# Patient Record
Sex: Male | Born: 1940
Health system: Southern US, Community
[De-identification: ages and names within clinical notes are randomized; demographics above are authoritative.]

## PROBLEM LIST (undated history)

## (undated) DIAGNOSIS — R51 Headache: Secondary | ICD-10-CM

## (undated) DIAGNOSIS — I499 Cardiac arrhythmia, unspecified: Secondary | ICD-10-CM

## (undated) DIAGNOSIS — E7800 Pure hypercholesterolemia, unspecified: Secondary | ICD-10-CM

## (undated) DIAGNOSIS — J302 Other seasonal allergic rhinitis: Secondary | ICD-10-CM

## (undated) DIAGNOSIS — Z95810 Presence of automatic (implantable) cardiac defibrillator: Secondary | ICD-10-CM

## (undated) DIAGNOSIS — M1990 Unspecified osteoarthritis, unspecified site: Secondary | ICD-10-CM

## (undated) DIAGNOSIS — IMO0001 Reserved for inherently not codable concepts without codable children: Secondary | ICD-10-CM

## (undated) DIAGNOSIS — I509 Heart failure, unspecified: Secondary | ICD-10-CM

## (undated) DIAGNOSIS — I219 Acute myocardial infarction, unspecified: Secondary | ICD-10-CM

## (undated) DIAGNOSIS — J342 Deviated nasal septum: Secondary | ICD-10-CM

## (undated) DIAGNOSIS — G4730 Sleep apnea, unspecified: Secondary | ICD-10-CM

## (undated) DIAGNOSIS — T8859XA Other complications of anesthesia, initial encounter: Secondary | ICD-10-CM

## (undated) DIAGNOSIS — R42 Dizziness and giddiness: Secondary | ICD-10-CM

## (undated) DIAGNOSIS — E039 Hypothyroidism, unspecified: Secondary | ICD-10-CM

## (undated) DIAGNOSIS — I2510 Atherosclerotic heart disease of native coronary artery without angina pectoris: Secondary | ICD-10-CM

## (undated) DIAGNOSIS — M359 Systemic involvement of connective tissue, unspecified: Secondary | ICD-10-CM

## (undated) DIAGNOSIS — R29898 Other symptoms and signs involving the musculoskeletal system: Secondary | ICD-10-CM

## (undated) DIAGNOSIS — Z950 Presence of cardiac pacemaker: Secondary | ICD-10-CM

## (undated) DIAGNOSIS — H9190 Unspecified hearing loss, unspecified ear: Secondary | ICD-10-CM

## (undated) DIAGNOSIS — J329 Chronic sinusitis, unspecified: Secondary | ICD-10-CM

## (undated) DIAGNOSIS — R2689 Other abnormalities of gait and mobility: Secondary | ICD-10-CM

## (undated) DIAGNOSIS — R519 Headache, unspecified: Secondary | ICD-10-CM

## (undated) DIAGNOSIS — T4145XA Adverse effect of unspecified anesthetic, initial encounter: Secondary | ICD-10-CM

## (undated) DIAGNOSIS — I739 Peripheral vascular disease, unspecified: Secondary | ICD-10-CM

## (undated) HISTORY — DX: Other symptoms and signs involving the musculoskeletal system: R29.898

## (undated) HISTORY — PX: CATARACT EXTRACTION: SUR2

## (undated) HISTORY — PX: INSERT / REPLACE / REMOVE PACEMAKER: SUR710

## (undated) HISTORY — DX: Unspecified osteoarthritis, unspecified site: M19.90

## (undated) HISTORY — DX: Headache, unspecified: R51.9

## (undated) HISTORY — PX: CARDIAC SURGERY: SHX584

## (undated) HISTORY — PX: OTHER SURGICAL HISTORY: SHX169

## (undated) HISTORY — DX: Other abnormalities of gait and mobility: R26.89

## (undated) HISTORY — PX: CORONARY ANGIOPLASTY: SHX604

## (undated) HISTORY — PX: SEPTOPLASTY: SUR1290

## (undated) HISTORY — DX: Pure hypercholesterolemia, unspecified: E78.00

## (undated) HISTORY — PX: CARDIAC ELECTROPHYSIOLOGY STUDY AND ABLATION: SHX1294

## (undated) HISTORY — DX: Headache: R51

---

## 1990-01-06 HISTORY — PX: CORONARY ARTERY BYPASS GRAFT: SHX141

## 2001-01-12 ENCOUNTER — Encounter: Payer: Self-pay | Admitting: Cardiology

## 2001-01-12 ENCOUNTER — Ambulatory Visit (HOSPITAL_COMMUNITY): Admission: RE | Admit: 2001-01-12 | Discharge: 2001-01-12 | Payer: Self-pay | Admitting: Cardiology

## 2011-04-04 DIAGNOSIS — H538 Other visual disturbances: Secondary | ICD-10-CM | POA: Diagnosis not present

## 2011-04-04 DIAGNOSIS — H53149 Visual discomfort, unspecified: Secondary | ICD-10-CM | POA: Diagnosis not present

## 2011-04-04 DIAGNOSIS — H524 Presbyopia: Secondary | ICD-10-CM | POA: Diagnosis not present

## 2011-04-04 DIAGNOSIS — H2510 Age-related nuclear cataract, unspecified eye: Secondary | ICD-10-CM | POA: Diagnosis not present

## 2011-04-29 DIAGNOSIS — H2510 Age-related nuclear cataract, unspecified eye: Secondary | ICD-10-CM | POA: Diagnosis not present

## 2011-04-29 DIAGNOSIS — H5700 Unspecified anomaly of pupillary function: Secondary | ICD-10-CM | POA: Diagnosis not present

## 2011-04-29 DIAGNOSIS — H269 Unspecified cataract: Secondary | ICD-10-CM | POA: Diagnosis not present

## 2011-05-26 DIAGNOSIS — H2510 Age-related nuclear cataract, unspecified eye: Secondary | ICD-10-CM | POA: Diagnosis not present

## 2011-05-26 DIAGNOSIS — Z961 Presence of intraocular lens: Secondary | ICD-10-CM | POA: Diagnosis not present

## 2011-06-10 DIAGNOSIS — H5700 Unspecified anomaly of pupillary function: Secondary | ICD-10-CM | POA: Diagnosis not present

## 2011-06-10 DIAGNOSIS — IMO0002 Reserved for concepts with insufficient information to code with codable children: Secondary | ICD-10-CM | POA: Diagnosis not present

## 2011-06-10 DIAGNOSIS — H269 Unspecified cataract: Secondary | ICD-10-CM | POA: Diagnosis not present

## 2011-07-07 DIAGNOSIS — R609 Edema, unspecified: Secondary | ICD-10-CM | POA: Diagnosis not present

## 2011-07-07 DIAGNOSIS — J309 Allergic rhinitis, unspecified: Secondary | ICD-10-CM | POA: Diagnosis not present

## 2011-07-07 DIAGNOSIS — I10 Essential (primary) hypertension: Secondary | ICD-10-CM | POA: Diagnosis not present

## 2011-07-07 DIAGNOSIS — Z125 Encounter for screening for malignant neoplasm of prostate: Secondary | ICD-10-CM | POA: Diagnosis not present

## 2011-07-07 DIAGNOSIS — I2510 Atherosclerotic heart disease of native coronary artery without angina pectoris: Secondary | ICD-10-CM | POA: Diagnosis not present

## 2011-09-09 DIAGNOSIS — Z961 Presence of intraocular lens: Secondary | ICD-10-CM | POA: Diagnosis not present

## 2011-09-09 DIAGNOSIS — H40019 Open angle with borderline findings, low risk, unspecified eye: Secondary | ICD-10-CM | POA: Diagnosis not present

## 2011-09-09 DIAGNOSIS — H524 Presbyopia: Secondary | ICD-10-CM | POA: Diagnosis not present

## 2012-01-14 DIAGNOSIS — Z23 Encounter for immunization: Secondary | ICD-10-CM | POA: Diagnosis not present

## 2012-01-14 DIAGNOSIS — I10 Essential (primary) hypertension: Secondary | ICD-10-CM | POA: Diagnosis not present

## 2012-01-14 DIAGNOSIS — I2510 Atherosclerotic heart disease of native coronary artery without angina pectoris: Secondary | ICD-10-CM | POA: Diagnosis not present

## 2012-01-14 DIAGNOSIS — R609 Edema, unspecified: Secondary | ICD-10-CM | POA: Diagnosis not present

## 2012-07-12 DIAGNOSIS — I10 Essential (primary) hypertension: Secondary | ICD-10-CM | POA: Insufficient documentation

## 2012-07-12 DIAGNOSIS — I214 Non-ST elevation (NSTEMI) myocardial infarction: Secondary | ICD-10-CM | POA: Insufficient documentation

## 2012-07-12 DIAGNOSIS — E785 Hyperlipidemia, unspecified: Secondary | ICD-10-CM | POA: Insufficient documentation

## 2012-07-12 DIAGNOSIS — M069 Rheumatoid arthritis, unspecified: Secondary | ICD-10-CM | POA: Insufficient documentation

## 2012-07-26 DIAGNOSIS — Z95810 Presence of automatic (implantable) cardiac defibrillator: Secondary | ICD-10-CM | POA: Insufficient documentation

## 2012-08-02 DIAGNOSIS — I5020 Unspecified systolic (congestive) heart failure: Secondary | ICD-10-CM | POA: Insufficient documentation

## 2012-08-16 DIAGNOSIS — R0989 Other specified symptoms and signs involving the circulatory and respiratory systems: Secondary | ICD-10-CM | POA: Diagnosis not present

## 2012-09-14 DIAGNOSIS — I472 Ventricular tachycardia: Secondary | ICD-10-CM | POA: Insufficient documentation

## 2012-09-14 DIAGNOSIS — I4729 Other ventricular tachycardia: Secondary | ICD-10-CM | POA: Insufficient documentation

## 2013-01-12 DIAGNOSIS — L989 Disorder of the skin and subcutaneous tissue, unspecified: Secondary | ICD-10-CM | POA: Diagnosis not present

## 2013-01-12 DIAGNOSIS — R799 Abnormal finding of blood chemistry, unspecified: Secondary | ICD-10-CM | POA: Diagnosis not present

## 2013-01-12 DIAGNOSIS — M069 Rheumatoid arthritis, unspecified: Secondary | ICD-10-CM | POA: Diagnosis not present

## 2013-01-19 DIAGNOSIS — I2510 Atherosclerotic heart disease of native coronary artery without angina pectoris: Secondary | ICD-10-CM | POA: Diagnosis not present

## 2013-01-19 DIAGNOSIS — I10 Essential (primary) hypertension: Secondary | ICD-10-CM | POA: Diagnosis not present

## 2013-01-19 DIAGNOSIS — I5020 Unspecified systolic (congestive) heart failure: Secondary | ICD-10-CM | POA: Diagnosis not present

## 2013-02-03 DIAGNOSIS — I509 Heart failure, unspecified: Secondary | ICD-10-CM | POA: Diagnosis not present

## 2013-02-03 DIAGNOSIS — R5383 Other fatigue: Secondary | ICD-10-CM | POA: Diagnosis not present

## 2013-02-03 DIAGNOSIS — J019 Acute sinusitis, unspecified: Secondary | ICD-10-CM | POA: Diagnosis not present

## 2013-02-03 DIAGNOSIS — R5381 Other malaise: Secondary | ICD-10-CM | POA: Diagnosis not present

## 2013-02-09 DIAGNOSIS — Z95810 Presence of automatic (implantable) cardiac defibrillator: Secondary | ICD-10-CM | POA: Diagnosis not present

## 2013-02-17 DIAGNOSIS — I519 Heart disease, unspecified: Secondary | ICD-10-CM | POA: Diagnosis not present

## 2013-02-17 DIAGNOSIS — I080 Rheumatic disorders of both mitral and aortic valves: Secondary | ICD-10-CM | POA: Diagnosis not present

## 2013-02-17 DIAGNOSIS — I4729 Other ventricular tachycardia: Secondary | ICD-10-CM | POA: Diagnosis not present

## 2013-02-17 DIAGNOSIS — I5020 Unspecified systolic (congestive) heart failure: Secondary | ICD-10-CM | POA: Diagnosis not present

## 2013-02-17 DIAGNOSIS — I517 Cardiomegaly: Secondary | ICD-10-CM | POA: Diagnosis not present

## 2013-02-17 DIAGNOSIS — I10 Essential (primary) hypertension: Secondary | ICD-10-CM | POA: Diagnosis not present

## 2013-02-17 DIAGNOSIS — I472 Ventricular tachycardia: Secondary | ICD-10-CM | POA: Diagnosis not present

## 2013-02-17 DIAGNOSIS — I2510 Atherosclerotic heart disease of native coronary artery without angina pectoris: Secondary | ICD-10-CM | POA: Diagnosis not present

## 2013-02-17 DIAGNOSIS — I079 Rheumatic tricuspid valve disease, unspecified: Secondary | ICD-10-CM | POA: Diagnosis not present

## 2013-02-17 DIAGNOSIS — I509 Heart failure, unspecified: Secondary | ICD-10-CM | POA: Diagnosis not present

## 2013-02-17 DIAGNOSIS — I214 Non-ST elevation (NSTEMI) myocardial infarction: Secondary | ICD-10-CM | POA: Diagnosis not present

## 2013-03-21 DIAGNOSIS — C44319 Basal cell carcinoma of skin of other parts of face: Secondary | ICD-10-CM | POA: Diagnosis not present

## 2013-03-24 DIAGNOSIS — I509 Heart failure, unspecified: Secondary | ICD-10-CM | POA: Diagnosis not present

## 2013-03-24 DIAGNOSIS — Z79899 Other long term (current) drug therapy: Secondary | ICD-10-CM | POA: Diagnosis not present

## 2013-03-24 DIAGNOSIS — E7800 Pure hypercholesterolemia, unspecified: Secondary | ICD-10-CM | POA: Diagnosis not present

## 2013-03-24 DIAGNOSIS — Z125 Encounter for screening for malignant neoplasm of prostate: Secondary | ICD-10-CM | POA: Diagnosis not present

## 2013-03-24 DIAGNOSIS — I2510 Atherosclerotic heart disease of native coronary artery without angina pectoris: Secondary | ICD-10-CM | POA: Diagnosis not present

## 2013-03-24 DIAGNOSIS — I10 Essential (primary) hypertension: Secondary | ICD-10-CM | POA: Diagnosis not present

## 2013-04-11 DIAGNOSIS — Z95810 Presence of automatic (implantable) cardiac defibrillator: Secondary | ICD-10-CM | POA: Diagnosis not present

## 2013-04-11 DIAGNOSIS — I259 Chronic ischemic heart disease, unspecified: Secondary | ICD-10-CM | POA: Diagnosis not present

## 2013-04-11 DIAGNOSIS — IMO0001 Reserved for inherently not codable concepts without codable children: Secondary | ICD-10-CM | POA: Diagnosis not present

## 2013-04-13 DIAGNOSIS — H35039 Hypertensive retinopathy, unspecified eye: Secondary | ICD-10-CM | POA: Diagnosis not present

## 2013-04-13 DIAGNOSIS — H5210 Myopia, unspecified eye: Secondary | ICD-10-CM | POA: Diagnosis not present

## 2013-04-13 DIAGNOSIS — H04129 Dry eye syndrome of unspecified lacrimal gland: Secondary | ICD-10-CM | POA: Diagnosis not present

## 2013-04-13 DIAGNOSIS — H40019 Open angle with borderline findings, low risk, unspecified eye: Secondary | ICD-10-CM | POA: Diagnosis not present

## 2013-04-13 DIAGNOSIS — H01009 Unspecified blepharitis unspecified eye, unspecified eyelid: Secondary | ICD-10-CM | POA: Diagnosis not present

## 2013-04-13 DIAGNOSIS — Z961 Presence of intraocular lens: Secondary | ICD-10-CM | POA: Diagnosis not present

## 2013-04-18 DIAGNOSIS — C44319 Basal cell carcinoma of skin of other parts of face: Secondary | ICD-10-CM | POA: Diagnosis not present

## 2013-04-18 DIAGNOSIS — B351 Tinea unguium: Secondary | ICD-10-CM | POA: Diagnosis not present

## 2013-04-18 DIAGNOSIS — B352 Tinea manuum: Secondary | ICD-10-CM | POA: Diagnosis not present

## 2013-04-19 DIAGNOSIS — M069 Rheumatoid arthritis, unspecified: Secondary | ICD-10-CM | POA: Diagnosis not present

## 2013-04-19 DIAGNOSIS — R799 Abnormal finding of blood chemistry, unspecified: Secondary | ICD-10-CM | POA: Diagnosis not present

## 2013-04-19 DIAGNOSIS — H04129 Dry eye syndrome of unspecified lacrimal gland: Secondary | ICD-10-CM | POA: Diagnosis not present

## 2013-04-19 DIAGNOSIS — L989 Disorder of the skin and subcutaneous tissue, unspecified: Secondary | ICD-10-CM | POA: Diagnosis not present

## 2013-05-17 DIAGNOSIS — R141 Gas pain: Secondary | ICD-10-CM | POA: Diagnosis not present

## 2013-05-17 DIAGNOSIS — Z79899 Other long term (current) drug therapy: Secondary | ICD-10-CM | POA: Diagnosis not present

## 2013-05-17 DIAGNOSIS — R143 Flatulence: Secondary | ICD-10-CM | POA: Diagnosis not present

## 2013-05-17 DIAGNOSIS — I509 Heart failure, unspecified: Secondary | ICD-10-CM | POA: Diagnosis not present

## 2013-05-26 DIAGNOSIS — I5020 Unspecified systolic (congestive) heart failure: Secondary | ICD-10-CM | POA: Diagnosis not present

## 2013-05-26 DIAGNOSIS — I10 Essential (primary) hypertension: Secondary | ICD-10-CM | POA: Diagnosis not present

## 2013-05-26 DIAGNOSIS — I214 Non-ST elevation (NSTEMI) myocardial infarction: Secondary | ICD-10-CM | POA: Diagnosis not present

## 2013-05-26 DIAGNOSIS — E785 Hyperlipidemia, unspecified: Secondary | ICD-10-CM | POA: Diagnosis not present

## 2013-06-03 DIAGNOSIS — Z79899 Other long term (current) drug therapy: Secondary | ICD-10-CM | POA: Diagnosis not present

## 2013-06-14 DIAGNOSIS — I10 Essential (primary) hypertension: Secondary | ICD-10-CM | POA: Diagnosis not present

## 2013-06-14 DIAGNOSIS — I214 Non-ST elevation (NSTEMI) myocardial infarction: Secondary | ICD-10-CM | POA: Diagnosis not present

## 2013-06-14 DIAGNOSIS — I472 Ventricular tachycardia: Secondary | ICD-10-CM | POA: Diagnosis not present

## 2013-06-14 DIAGNOSIS — R5381 Other malaise: Secondary | ICD-10-CM | POA: Diagnosis not present

## 2013-06-14 DIAGNOSIS — R5383 Other fatigue: Secondary | ICD-10-CM | POA: Diagnosis not present

## 2013-06-14 DIAGNOSIS — I5020 Unspecified systolic (congestive) heart failure: Secondary | ICD-10-CM | POA: Diagnosis not present

## 2013-06-14 DIAGNOSIS — I4729 Other ventricular tachycardia: Secondary | ICD-10-CM | POA: Diagnosis not present

## 2013-06-14 DIAGNOSIS — I2510 Atherosclerotic heart disease of native coronary artery without angina pectoris: Secondary | ICD-10-CM | POA: Diagnosis not present

## 2013-06-14 DIAGNOSIS — Z0189 Encounter for other specified special examinations: Secondary | ICD-10-CM | POA: Diagnosis not present

## 2013-06-21 DIAGNOSIS — H04129 Dry eye syndrome of unspecified lacrimal gland: Secondary | ICD-10-CM | POA: Diagnosis not present

## 2013-06-21 DIAGNOSIS — H01009 Unspecified blepharitis unspecified eye, unspecified eyelid: Secondary | ICD-10-CM | POA: Diagnosis not present

## 2013-06-21 DIAGNOSIS — H40019 Open angle with borderline findings, low risk, unspecified eye: Secondary | ICD-10-CM | POA: Diagnosis not present

## 2013-06-29 DIAGNOSIS — H04129 Dry eye syndrome of unspecified lacrimal gland: Secondary | ICD-10-CM | POA: Diagnosis not present

## 2013-06-29 DIAGNOSIS — M069 Rheumatoid arthritis, unspecified: Secondary | ICD-10-CM | POA: Diagnosis not present

## 2013-06-29 DIAGNOSIS — R799 Abnormal finding of blood chemistry, unspecified: Secondary | ICD-10-CM | POA: Diagnosis not present

## 2013-06-29 DIAGNOSIS — L989 Disorder of the skin and subcutaneous tissue, unspecified: Secondary | ICD-10-CM | POA: Diagnosis not present

## 2013-07-02 ENCOUNTER — Observation Stay: Payer: Self-pay | Admitting: Physician Assistant

## 2013-07-02 DIAGNOSIS — R079 Chest pain, unspecified: Secondary | ICD-10-CM | POA: Diagnosis not present

## 2013-07-02 DIAGNOSIS — Z79899 Other long term (current) drug therapy: Secondary | ICD-10-CM | POA: Diagnosis not present

## 2013-07-02 DIAGNOSIS — Z951 Presence of aortocoronary bypass graft: Secondary | ICD-10-CM | POA: Diagnosis not present

## 2013-07-02 DIAGNOSIS — M069 Rheumatoid arthritis, unspecified: Secondary | ICD-10-CM | POA: Diagnosis not present

## 2013-07-02 DIAGNOSIS — R0789 Other chest pain: Secondary | ICD-10-CM | POA: Diagnosis not present

## 2013-07-02 DIAGNOSIS — I4729 Other ventricular tachycardia: Secondary | ICD-10-CM | POA: Diagnosis not present

## 2013-07-02 DIAGNOSIS — I25810 Atherosclerosis of coronary artery bypass graft(s) without angina pectoris: Secondary | ICD-10-CM | POA: Diagnosis not present

## 2013-07-02 DIAGNOSIS — Z9861 Coronary angioplasty status: Secondary | ICD-10-CM | POA: Diagnosis not present

## 2013-07-02 DIAGNOSIS — R109 Unspecified abdominal pain: Secondary | ICD-10-CM | POA: Diagnosis not present

## 2013-07-02 DIAGNOSIS — Z7902 Long term (current) use of antithrombotics/antiplatelets: Secondary | ICD-10-CM | POA: Diagnosis not present

## 2013-07-02 DIAGNOSIS — I472 Ventricular tachycardia: Secondary | ICD-10-CM | POA: Diagnosis not present

## 2013-07-02 DIAGNOSIS — I2510 Atherosclerotic heart disease of native coronary artery without angina pectoris: Secondary | ICD-10-CM | POA: Diagnosis not present

## 2013-07-02 DIAGNOSIS — Z95810 Presence of automatic (implantable) cardiac defibrillator: Secondary | ICD-10-CM | POA: Diagnosis not present

## 2013-07-02 DIAGNOSIS — K219 Gastro-esophageal reflux disease without esophagitis: Secondary | ICD-10-CM | POA: Diagnosis not present

## 2013-07-02 DIAGNOSIS — Z8489 Family history of other specified conditions: Secondary | ICD-10-CM | POA: Diagnosis not present

## 2013-07-02 DIAGNOSIS — I4510 Unspecified right bundle-branch block: Secondary | ICD-10-CM | POA: Diagnosis not present

## 2013-07-02 LAB — BASIC METABOLIC PANEL
Anion Gap: 10 (ref 7–16)
BUN: 23 mg/dL — AB (ref 7–18)
CALCIUM: 9.1 mg/dL (ref 8.5–10.1)
CREATININE: 1.58 mg/dL — AB (ref 0.60–1.30)
Chloride: 100 mmol/L (ref 98–107)
Co2: 27 mmol/L (ref 21–32)
EGFR (African American): 50 — ABNORMAL LOW
GFR CALC NON AF AMER: 43 — AB
Glucose: 89 mg/dL (ref 65–99)
Osmolality: 277 (ref 275–301)
Potassium: 3.7 mmol/L (ref 3.5–5.1)
SODIUM: 137 mmol/L (ref 136–145)

## 2013-07-02 LAB — HEPATIC FUNCTION PANEL A (ARMC)
ALBUMIN: 3.8 g/dL (ref 3.4–5.0)
ALT: 33 U/L (ref 12–78)
Alkaline Phosphatase: 56 U/L
Bilirubin, Direct: <0.1 mg/dL (ref 0.00–0.20)
Bilirubin,Total: 0.3 mg/dL (ref 0.2–1.0)
SGOT(AST): 35 U/L (ref 15–37)
TOTAL PROTEIN: 7.5 g/dL (ref 6.4–8.2)

## 2013-07-02 LAB — CBC
HCT: 39.4 % — AB (ref 40.0–52.0)
HGB: 12.8 g/dL — ABNORMAL LOW (ref 13.0–18.0)
MCH: 31.7 pg (ref 26.0–34.0)
MCHC: 32.6 g/dL (ref 32.0–36.0)
MCV: 97 fL (ref 80–100)
Platelet: 222 10*3/uL (ref 150–440)
RBC: 4.05 10*6/uL — AB (ref 4.40–5.90)
RDW: 17.2 % — ABNORMAL HIGH (ref 11.5–14.5)
WBC: 9.3 10*3/uL (ref 3.8–10.6)

## 2013-07-02 LAB — LIPASE, BLOOD: LIPASE: 225 U/L (ref 73–393)

## 2013-07-02 LAB — TROPONIN I: Troponin-I: <0.02 ng/mL

## 2013-07-03 DIAGNOSIS — R079 Chest pain, unspecified: Secondary | ICD-10-CM | POA: Diagnosis not present

## 2013-07-03 DIAGNOSIS — I25810 Atherosclerosis of coronary artery bypass graft(s) without angina pectoris: Secondary | ICD-10-CM | POA: Diagnosis not present

## 2013-07-03 LAB — CBC WITH DIFFERENTIAL/PLATELET
Basophil #: 0.1 10*3/uL (ref 0.0–0.1)
Basophil %: 1.1 %
EOS PCT: 4.1 %
Eosinophil #: 0.3 10*3/uL (ref 0.0–0.7)
HCT: 36.2 % — AB (ref 40.0–52.0)
HGB: 12 g/dL — ABNORMAL LOW (ref 13.0–18.0)
LYMPHS PCT: 18.2 %
Lymphocyte #: 1.4 10*3/uL (ref 1.0–3.6)
MCH: 32.2 pg (ref 26.0–34.0)
MCHC: 33.2 g/dL (ref 32.0–36.0)
MCV: 97 fL (ref 80–100)
MONO ABS: 1.2 x10 3/mm — AB (ref 0.2–1.0)
Monocyte %: 15.9 %
NEUTROS ABS: 4.6 10*3/uL (ref 1.4–6.5)
NEUTROS PCT: 60.7 %
PLATELETS: 199 10*3/uL (ref 150–440)
RBC: 3.73 10*6/uL — AB (ref 4.40–5.90)
RDW: 17.2 % — ABNORMAL HIGH (ref 11.5–14.5)
WBC: 7.6 10*3/uL (ref 3.8–10.6)

## 2013-07-03 LAB — BASIC METABOLIC PANEL
Anion Gap: 7 (ref 7–16)
BUN: 19 mg/dL — ABNORMAL HIGH (ref 7–18)
CALCIUM: 9 mg/dL (ref 8.5–10.1)
Chloride: 103 mmol/L (ref 98–107)
Co2: 27 mmol/L (ref 21–32)
Creatinine: 1.54 mg/dL — ABNORMAL HIGH (ref 0.60–1.30)
EGFR (African American): 51 — ABNORMAL LOW
EGFR (Non-African Amer.): 44 — ABNORMAL LOW
Glucose: 90 mg/dL (ref 65–99)
Osmolality: 276 (ref 275–301)
POTASSIUM: 4.2 mmol/L (ref 3.5–5.1)
Sodium: 137 mmol/L (ref 136–145)

## 2013-07-03 LAB — CK-MB
CK-MB: 0.9 ng/mL (ref 0.5–3.6)
CK-MB: 1.1 ng/mL (ref 0.5–3.6)
CK-MB: 1.2 ng/mL (ref 0.5–3.6)

## 2013-07-03 LAB — TROPONIN I
Troponin-I: <0.02 ng/mL
Troponin-I: <0.02 ng/mL

## 2013-08-23 DIAGNOSIS — C44319 Basal cell carcinoma of skin of other parts of face: Secondary | ICD-10-CM | POA: Diagnosis not present

## 2013-08-23 DIAGNOSIS — B354 Tinea corporis: Secondary | ICD-10-CM | POA: Diagnosis not present

## 2013-08-23 DIAGNOSIS — B356 Tinea cruris: Secondary | ICD-10-CM | POA: Diagnosis not present

## 2013-08-23 DIAGNOSIS — L570 Actinic keratosis: Secondary | ICD-10-CM | POA: Diagnosis not present

## 2013-08-30 DIAGNOSIS — R799 Abnormal finding of blood chemistry, unspecified: Secondary | ICD-10-CM | POA: Diagnosis not present

## 2013-08-30 DIAGNOSIS — L989 Disorder of the skin and subcutaneous tissue, unspecified: Secondary | ICD-10-CM | POA: Diagnosis not present

## 2013-08-30 DIAGNOSIS — H04129 Dry eye syndrome of unspecified lacrimal gland: Secondary | ICD-10-CM | POA: Diagnosis not present

## 2013-08-30 DIAGNOSIS — M069 Rheumatoid arthritis, unspecified: Secondary | ICD-10-CM | POA: Diagnosis not present

## 2013-09-14 DIAGNOSIS — Z95810 Presence of automatic (implantable) cardiac defibrillator: Secondary | ICD-10-CM | POA: Diagnosis not present

## 2013-09-14 DIAGNOSIS — I472 Ventricular tachycardia: Secondary | ICD-10-CM | POA: Diagnosis not present

## 2013-09-14 DIAGNOSIS — I10 Essential (primary) hypertension: Secondary | ICD-10-CM | POA: Diagnosis not present

## 2013-09-14 DIAGNOSIS — I4729 Other ventricular tachycardia: Secondary | ICD-10-CM | POA: Diagnosis not present

## 2013-09-14 DIAGNOSIS — I2510 Atherosclerotic heart disease of native coronary artery without angina pectoris: Secondary | ICD-10-CM | POA: Diagnosis not present

## 2013-09-19 DIAGNOSIS — C44319 Basal cell carcinoma of skin of other parts of face: Secondary | ICD-10-CM | POA: Diagnosis not present

## 2013-09-19 DIAGNOSIS — L989 Disorder of the skin and subcutaneous tissue, unspecified: Secondary | ICD-10-CM | POA: Diagnosis not present

## 2013-09-19 DIAGNOSIS — L408 Other psoriasis: Secondary | ICD-10-CM | POA: Diagnosis not present

## 2013-09-28 ENCOUNTER — Emergency Department: Payer: Self-pay | Admitting: Emergency Medicine

## 2013-09-28 DIAGNOSIS — R110 Nausea: Secondary | ICD-10-CM | POA: Diagnosis not present

## 2013-09-28 DIAGNOSIS — R5383 Other fatigue: Secondary | ICD-10-CM | POA: Diagnosis not present

## 2013-09-28 DIAGNOSIS — Z951 Presence of aortocoronary bypass graft: Secondary | ICD-10-CM | POA: Diagnosis not present

## 2013-09-28 DIAGNOSIS — Z79899 Other long term (current) drug therapy: Secondary | ICD-10-CM | POA: Diagnosis not present

## 2013-09-28 DIAGNOSIS — J438 Other emphysema: Secondary | ICD-10-CM | POA: Diagnosis not present

## 2013-09-28 DIAGNOSIS — M542 Cervicalgia: Secondary | ICD-10-CM | POA: Diagnosis not present

## 2013-09-28 DIAGNOSIS — R42 Dizziness and giddiness: Secondary | ICD-10-CM | POA: Diagnosis not present

## 2013-09-28 DIAGNOSIS — E860 Dehydration: Secondary | ICD-10-CM | POA: Diagnosis not present

## 2013-09-28 DIAGNOSIS — Z7982 Long term (current) use of aspirin: Secondary | ICD-10-CM | POA: Diagnosis not present

## 2013-09-28 DIAGNOSIS — Z7902 Long term (current) use of antithrombotics/antiplatelets: Secondary | ICD-10-CM | POA: Diagnosis not present

## 2013-09-28 DIAGNOSIS — R5381 Other malaise: Secondary | ICD-10-CM | POA: Diagnosis not present

## 2013-09-28 LAB — HEPATIC FUNCTION PANEL A (ARMC)
ALK PHOS: 53 U/L
Albumin: 3.9 g/dL (ref 3.4–5.0)
Bilirubin, Direct: 0.1 mg/dL (ref 0.00–0.20)
Bilirubin,Total: 0.5 mg/dL (ref 0.2–1.0)
SGOT(AST): 42 U/L — ABNORMAL HIGH (ref 15–37)
SGPT (ALT): 30 U/L
Total Protein: 7.6 g/dL (ref 6.4–8.2)

## 2013-09-28 LAB — BASIC METABOLIC PANEL
Anion Gap: 10 (ref 7–16)
BUN: 26 mg/dL — ABNORMAL HIGH (ref 7–18)
Calcium, Total: 8.4 mg/dL — ABNORMAL LOW (ref 8.5–10.1)
Chloride: 97 mmol/L — ABNORMAL LOW (ref 98–107)
Co2: 26 mmol/L (ref 21–32)
Creatinine: 1.83 mg/dL — ABNORMAL HIGH (ref 0.60–1.30)
EGFR (African American): 41 — ABNORMAL LOW
EGFR (Non-African Amer.): 36 — ABNORMAL LOW
Glucose: 107 mg/dL — ABNORMAL HIGH (ref 65–99)
Osmolality: 272 (ref 275–301)
POTASSIUM: 3.5 mmol/L (ref 3.5–5.1)
Sodium: 133 mmol/L — ABNORMAL LOW (ref 136–145)

## 2013-09-28 LAB — URINALYSIS, COMPLETE
BACTERIA: NONE SEEN
Bilirubin,UR: NEGATIVE
Blood: NEGATIVE
Glucose,UR: NEGATIVE mg/dL (ref 0–75)
LEUKOCYTE ESTERASE: NEGATIVE
Nitrite: NEGATIVE
PH: 5 (ref 4.5–8.0)
PROTEIN: NEGATIVE
SPECIFIC GRAVITY: 1.015 (ref 1.003–1.030)
Squamous Epithelial: NONE SEEN

## 2013-09-28 LAB — CBC WITH DIFFERENTIAL/PLATELET
BASOS PCT: 1.1 %
Basophil #: 0.1 10*3/uL (ref 0.0–0.1)
Eosinophil #: 0.1 10*3/uL (ref 0.0–0.7)
Eosinophil %: 1.5 %
HCT: 41.9 % (ref 40.0–52.0)
HGB: 13.6 g/dL (ref 13.0–18.0)
Lymphocyte #: 1.3 10*3/uL (ref 1.0–3.6)
Lymphocyte %: 17.1 %
MCH: 32.5 pg (ref 26.0–34.0)
MCHC: 32.4 g/dL (ref 32.0–36.0)
MCV: 100 fL (ref 80–100)
MONOS PCT: 13.7 %
Monocyte #: 1.1 x10 3/mm — ABNORMAL HIGH (ref 0.2–1.0)
NEUTROS PCT: 66.6 %
Neutrophil #: 5.2 10*3/uL (ref 1.4–6.5)
Platelet: 196 10*3/uL (ref 150–440)
RBC: 4.18 10*6/uL — ABNORMAL LOW (ref 4.40–5.90)
RDW: 14.7 % — AB (ref 11.5–14.5)
WBC: 7.8 10*3/uL (ref 3.8–10.6)

## 2013-09-28 LAB — CK TOTAL AND CKMB (NOT AT ARMC)
CK, Total: 269 U/L
CK-MB: 2.1 ng/mL (ref 0.5–3.6)

## 2013-09-28 LAB — LIPASE, BLOOD: Lipase: 207 U/L (ref 73–393)

## 2013-09-28 LAB — PROTIME-INR
INR: 1
PROTHROMBIN TIME: 12.8 s (ref 11.5–14.7)

## 2013-09-28 LAB — TROPONIN I: Troponin-I: <0.02 ng/mL

## 2013-09-29 DIAGNOSIS — R109 Unspecified abdominal pain: Secondary | ICD-10-CM | POA: Diagnosis not present

## 2013-09-29 DIAGNOSIS — Z23 Encounter for immunization: Secondary | ICD-10-CM | POA: Diagnosis not present

## 2013-09-29 DIAGNOSIS — I2510 Atherosclerotic heart disease of native coronary artery without angina pectoris: Secondary | ICD-10-CM | POA: Diagnosis not present

## 2013-09-29 DIAGNOSIS — I509 Heart failure, unspecified: Secondary | ICD-10-CM | POA: Diagnosis not present

## 2013-09-29 DIAGNOSIS — I10 Essential (primary) hypertension: Secondary | ICD-10-CM | POA: Diagnosis not present

## 2013-10-21 DIAGNOSIS — J329 Chronic sinusitis, unspecified: Secondary | ICD-10-CM | POA: Diagnosis not present

## 2013-10-21 DIAGNOSIS — J020 Streptococcal pharyngitis: Secondary | ICD-10-CM | POA: Diagnosis not present

## 2013-11-11 DIAGNOSIS — J020 Streptococcal pharyngitis: Secondary | ICD-10-CM | POA: Diagnosis not present

## 2013-11-11 DIAGNOSIS — J329 Chronic sinusitis, unspecified: Secondary | ICD-10-CM | POA: Diagnosis not present

## 2013-11-29 DIAGNOSIS — H04123 Dry eye syndrome of bilateral lacrimal glands: Secondary | ICD-10-CM | POA: Diagnosis not present

## 2013-11-29 DIAGNOSIS — M6281 Muscle weakness (generalized): Secondary | ICD-10-CM | POA: Diagnosis not present

## 2013-11-29 DIAGNOSIS — M0589 Other rheumatoid arthritis with rheumatoid factor of multiple sites: Secondary | ICD-10-CM | POA: Diagnosis not present

## 2013-11-30 DIAGNOSIS — J9801 Acute bronchospasm: Secondary | ICD-10-CM | POA: Diagnosis not present

## 2013-11-30 DIAGNOSIS — J209 Acute bronchitis, unspecified: Secondary | ICD-10-CM | POA: Diagnosis not present

## 2013-12-02 DIAGNOSIS — R55 Syncope and collapse: Secondary | ICD-10-CM | POA: Diagnosis not present

## 2013-12-02 DIAGNOSIS — Z7982 Long term (current) use of aspirin: Secondary | ICD-10-CM | POA: Diagnosis not present

## 2013-12-02 DIAGNOSIS — R079 Chest pain, unspecified: Secondary | ICD-10-CM | POA: Diagnosis not present

## 2013-12-02 DIAGNOSIS — E860 Dehydration: Secondary | ICD-10-CM | POA: Diagnosis not present

## 2013-12-02 DIAGNOSIS — I2510 Atherosclerotic heart disease of native coronary artery without angina pectoris: Secondary | ICD-10-CM | POA: Insufficient documentation

## 2013-12-02 DIAGNOSIS — J0100 Acute maxillary sinusitis, unspecified: Secondary | ICD-10-CM | POA: Diagnosis not present

## 2013-12-02 DIAGNOSIS — Z950 Presence of cardiac pacemaker: Secondary | ICD-10-CM | POA: Diagnosis not present

## 2013-12-02 DIAGNOSIS — S37009A Unspecified injury of unspecified kidney, initial encounter: Secondary | ICD-10-CM | POA: Insufficient documentation

## 2013-12-02 DIAGNOSIS — Z951 Presence of aortocoronary bypass graft: Secondary | ICD-10-CM | POA: Diagnosis not present

## 2013-12-02 DIAGNOSIS — R0989 Other specified symptoms and signs involving the circulatory and respiratory systems: Secondary | ICD-10-CM | POA: Diagnosis not present

## 2013-12-02 DIAGNOSIS — R05 Cough: Secondary | ICD-10-CM | POA: Diagnosis not present

## 2013-12-02 DIAGNOSIS — N179 Acute kidney failure, unspecified: Secondary | ICD-10-CM | POA: Diagnosis not present

## 2013-12-02 DIAGNOSIS — R42 Dizziness and giddiness: Secondary | ICD-10-CM | POA: Diagnosis not present

## 2013-12-02 DIAGNOSIS — E785 Hyperlipidemia, unspecified: Secondary | ICD-10-CM | POA: Diagnosis not present

## 2013-12-02 DIAGNOSIS — Z95810 Presence of automatic (implantable) cardiac defibrillator: Secondary | ICD-10-CM | POA: Diagnosis not present

## 2013-12-02 DIAGNOSIS — R062 Wheezing: Secondary | ICD-10-CM | POA: Diagnosis not present

## 2013-12-02 DIAGNOSIS — M069 Rheumatoid arthritis, unspecified: Secondary | ICD-10-CM | POA: Diagnosis not present

## 2013-12-02 DIAGNOSIS — I5020 Unspecified systolic (congestive) heart failure: Secondary | ICD-10-CM | POA: Diagnosis not present

## 2013-12-02 DIAGNOSIS — J329 Chronic sinusitis, unspecified: Secondary | ICD-10-CM | POA: Insufficient documentation

## 2013-12-02 DIAGNOSIS — R0982 Postnasal drip: Secondary | ICD-10-CM | POA: Diagnosis not present

## 2013-12-02 DIAGNOSIS — J0101 Acute recurrent maxillary sinusitis: Secondary | ICD-10-CM | POA: Diagnosis not present

## 2013-12-02 DIAGNOSIS — I252 Old myocardial infarction: Secondary | ICD-10-CM | POA: Diagnosis not present

## 2013-12-02 DIAGNOSIS — Z955 Presence of coronary angioplasty implant and graft: Secondary | ICD-10-CM | POA: Diagnosis not present

## 2013-12-02 DIAGNOSIS — I10 Essential (primary) hypertension: Secondary | ICD-10-CM | POA: Diagnosis not present

## 2013-12-03 DIAGNOSIS — I5020 Unspecified systolic (congestive) heart failure: Secondary | ICD-10-CM | POA: Diagnosis not present

## 2013-12-03 DIAGNOSIS — J0100 Acute maxillary sinusitis, unspecified: Secondary | ICD-10-CM | POA: Diagnosis not present

## 2013-12-03 DIAGNOSIS — E860 Dehydration: Secondary | ICD-10-CM | POA: Diagnosis not present

## 2013-12-03 DIAGNOSIS — R55 Syncope and collapse: Secondary | ICD-10-CM | POA: Diagnosis not present

## 2013-12-13 ENCOUNTER — Ambulatory Visit (INDEPENDENT_AMBULATORY_CARE_PROVIDER_SITE_OTHER): Payer: Medicare Other | Admitting: Neurology

## 2013-12-13 ENCOUNTER — Encounter: Payer: Self-pay | Admitting: Neurology

## 2013-12-13 VITALS — BP 126/74 | HR 66 | Ht 68.0 in | Wt 200.0 lb

## 2013-12-13 DIAGNOSIS — R269 Unspecified abnormalities of gait and mobility: Secondary | ICD-10-CM

## 2013-12-13 DIAGNOSIS — I5020 Unspecified systolic (congestive) heart failure: Secondary | ICD-10-CM | POA: Diagnosis not present

## 2013-12-13 DIAGNOSIS — M545 Low back pain, unspecified: Secondary | ICD-10-CM | POA: Insufficient documentation

## 2013-12-13 DIAGNOSIS — I4729 Other ventricular tachycardia: Secondary | ICD-10-CM

## 2013-12-13 DIAGNOSIS — I472 Ventricular tachycardia: Secondary | ICD-10-CM | POA: Diagnosis not present

## 2013-12-13 DIAGNOSIS — R51 Headache: Secondary | ICD-10-CM | POA: Diagnosis not present

## 2013-12-13 DIAGNOSIS — M1990 Unspecified osteoarthritis, unspecified site: Secondary | ICD-10-CM

## 2013-12-13 DIAGNOSIS — M5442 Lumbago with sciatica, left side: Secondary | ICD-10-CM

## 2013-12-13 DIAGNOSIS — I10 Essential (primary) hypertension: Secondary | ICD-10-CM

## 2013-12-13 DIAGNOSIS — R519 Headache, unspecified: Secondary | ICD-10-CM | POA: Insufficient documentation

## 2013-12-13 NOTE — Progress Notes (Signed)
PATIENT: [name] DOB: [birth year]  HISTORICAL  [name] is a 73 years old male, was referred by his primary care physician PA Cyndi Bender, [facility] in [place] for evaluation of bilateral lower extremity weakness  He has a past medical history of rheumatoid arthritis, involving multiple sites, with positive rheumatoid factor, CCP, he has been treated with methotrexate 2.5 mg 6 tablets every week for couple years, continue have joints pain, also with past medical history of hypertension, coronary artery disease, status post MI in July 2014,  he went into congestive heart failure, require cardiac pump at [facility], followed by pacemaker placement,  He complains of chronic low back pain, radiating pain to his left lower extremity for more than 40 years, he injured his back then after heavy lifting, over the past 40 years, he had intermittent flareup of worsening low back pain, radiating pain to her left leg, numbness tingling, getting worse since his heart attack in July 2014, now with dense left lower extremity numbness, also similar, but milder involvement of right leg, he has numbness from knee down, he denies bilateral upper extremity paresthesia or weakness, he also has obesity, lack of stamina, shortness of breath with minimum exertion  He denies visual change, he denies significant neck pain, no bowel and bladder incontinence.    REVIEW OF SYSTEMS: Full 14 system review of systems performed and notable only for fatigue, swelling in legs, hearing loss, blurry vision, shortness of breath, snoring, feeling cold, joints pain, allergy, numbness, weakness, dizziness, sleepiness, restless leg, decreased energy, change in appetite.   ALLERGIES: Not on File  HOME MEDICATIONS: No current outpatient prescriptions on file prior to visit.   No current facility-administered medications on file prior to visit.    PAST MEDICAL HISTORY: Past Medical History  Diagnosis Date    . Leg weakness   . Balance problem   . Arthritis   . Sinus headache   . High cholesterol     PAST SURGICAL HISTORY: Past Surgical History  Procedure Laterality Date  . Cardiac surgery  1992  . Stents  July 2014  . Heart pump    . Pace maker      FAMILY HISTORY: Family History  Problem Relation Age of Onset  . Alzheimer's disease Mother     SOCIAL HISTORY:  History   Social History  . Marital Status: Married    Spouse Name: Dian    Number of Children: 3  . Years of Education: 12   Occupational History    Retired   Social History Main Topics  . Smoking status: Never Smoker   . Smokeless tobacco: Never Used  . Alcohol Use: No  . Drug Use: No  . Sexual Activity: Not on file   Other Topics Concern  . Not on file   Social History Narrative   Patient lives at home with his wife Danne [name]).   Retired.   Education 12th grade.   Right handed.   Caffeine one cup daily.     PHYSICAL EXAM   There were no vitals filed for this visit.  Not recorded      There is no height or weight on file to calculate BMI.   Generalized: In no acute distress  Neck: Supple, no carotid bruits   Cardiac: Regular rate rhythm  Pulmonary: Clear to auscultation bilaterally  Musculoskeletal: No deformity  Neurological examination  Mentation: Alert oriented to time, place, history taking, and causual conversation, Obese   Cranial nerve  II-XII: Pupils were equal round reactive to light. Extraocular movements were full.  Visual field were full on confrontational test. Bilateral fundi were sharp.  Facial sensation and strength were normal. Hearing was intact to finger rubbing bilaterally. Uvula tongue midline.  Head turning and shoulder shrug and were normal and symmetric.Tongue protrusion into cheek strength was normal.  Motor: Normal tone, bulk and strength.  Sensory: Length dependent decreased fine touch, pinprick to knee level, absent toe vibratory   sensation,  Coordination: Normal finger to nose, heel-to-shin bilaterally there was no truncal ataxia  Gait: Rising up from seated positioby pushing on chair arm, cautious, has difficulty with heel walking, more on left side   Romberg signs: Negative  Deep tendon reflexes: Brachioradialis 2/2, biceps 2/2, triceps 2/2, patellar 2/2, Achilles  absent , plantar responses were flexor bilaterally.   DIAGNOSTIC DATA (LABS, IMAGING, TESTING) - I reviewed patient records, labs, notes, testing and imaging myself where available.  ASSESSMENT AND PLAN  ROBERT SPERL is a 73 y.o. male with past medical history of rheumatoid arthritis, presenting with chronic low back pain, radiating pain to left lower extremity, gait difficulty, length dependent sensory changes, brisk upper extremity, and patellar reflexes,  He also has significant comorbidity, hypertension, coronary artery disease, status post pacemaker  1, his gait difficulty is combination of peripheral neuropathy, likely component of lumbar radiculopathy, need to rule out cervical myelopathy 2. EMG nerve conduction study 3. CT cervical, lumbar 4. PT.  Marcial Pacas, M.D. Ph.D.  [facility] [address] [place], [place] [address] [phone]

## 2013-12-20 DIAGNOSIS — I2510 Atherosclerotic heart disease of native coronary artery without angina pectoris: Secondary | ICD-10-CM | POA: Diagnosis not present

## 2013-12-20 DIAGNOSIS — I472 Ventricular tachycardia: Secondary | ICD-10-CM | POA: Diagnosis not present

## 2013-12-20 DIAGNOSIS — I255 Ischemic cardiomyopathy: Secondary | ICD-10-CM | POA: Diagnosis not present

## 2013-12-20 DIAGNOSIS — I214 Non-ST elevation (NSTEMI) myocardial infarction: Secondary | ICD-10-CM | POA: Diagnosis not present

## 2013-12-25 ENCOUNTER — Emergency Department: Payer: Self-pay | Admitting: Emergency Medicine

## 2013-12-25 DIAGNOSIS — Z9989 Dependence on other enabling machines and devices: Secondary | ICD-10-CM | POA: Diagnosis not present

## 2013-12-25 DIAGNOSIS — I472 Ventricular tachycardia: Secondary | ICD-10-CM | POA: Diagnosis not present

## 2013-12-25 DIAGNOSIS — I509 Heart failure, unspecified: Secondary | ICD-10-CM | POA: Diagnosis not present

## 2013-12-25 LAB — CBC
HCT: 42.7 % (ref 40.0–52.0)
HGB: 13.8 g/dL (ref 13.0–18.0)
MCH: 32.5 pg (ref 26.0–34.0)
MCHC: 32.3 g/dL (ref 32.0–36.0)
MCV: 101 fL — ABNORMAL HIGH (ref 80–100)
PLATELETS: 184 10*3/uL (ref 150–440)
RBC: 4.25 10*6/uL — AB (ref 4.40–5.90)
RDW: 15.1 % — ABNORMAL HIGH (ref 11.5–14.5)
WBC: 9.9 10*3/uL (ref 3.8–10.6)

## 2013-12-25 LAB — BASIC METABOLIC PANEL
Anion Gap: 9 (ref 7–16)
BUN: 21 mg/dL — ABNORMAL HIGH (ref 7–18)
Calcium, Total: 8.7 mg/dL (ref 8.5–10.1)
Chloride: 105 mmol/L (ref 98–107)
Co2: 24 mmol/L (ref 21–32)
Creatinine: 1.09 mg/dL (ref 0.60–1.30)
EGFR (African American): >60
EGFR (Non-African Amer.): >60
Glucose: 91 mg/dL (ref 65–99)
Osmolality: 278 (ref 275–301)
Potassium: 5.4 mmol/L — ABNORMAL HIGH (ref 3.5–5.1)
Sodium: 138 mmol/L (ref 136–145)

## 2013-12-25 LAB — TROPONIN I: TROPONIN-I: 0.02 ng/mL

## 2014-01-03 ENCOUNTER — Encounter: Payer: Medicare Other | Admitting: Neurology

## 2014-01-04 DIAGNOSIS — K5790 Diverticulosis of intestine, part unspecified, without perforation or abscess without bleeding: Secondary | ICD-10-CM | POA: Diagnosis not present

## 2014-01-04 DIAGNOSIS — R0789 Other chest pain: Secondary | ICD-10-CM | POA: Diagnosis not present

## 2014-01-04 DIAGNOSIS — I252 Old myocardial infarction: Secondary | ICD-10-CM | POA: Diagnosis not present

## 2014-01-04 DIAGNOSIS — I4510 Unspecified right bundle-branch block: Secondary | ICD-10-CM | POA: Diagnosis not present

## 2014-01-04 DIAGNOSIS — K5732 Diverticulitis of large intestine without perforation or abscess without bleeding: Secondary | ICD-10-CM | POA: Diagnosis not present

## 2014-01-04 DIAGNOSIS — R1084 Generalized abdominal pain: Secondary | ICD-10-CM | POA: Diagnosis not present

## 2014-01-04 DIAGNOSIS — K8020 Calculus of gallbladder without cholecystitis without obstruction: Secondary | ICD-10-CM | POA: Diagnosis not present

## 2014-01-04 DIAGNOSIS — Z5181 Encounter for therapeutic drug level monitoring: Secondary | ICD-10-CM | POA: Diagnosis not present

## 2014-01-04 DIAGNOSIS — I10 Essential (primary) hypertension: Secondary | ICD-10-CM | POA: Diagnosis not present

## 2014-01-04 DIAGNOSIS — Z95810 Presence of automatic (implantable) cardiac defibrillator: Secondary | ICD-10-CM | POA: Diagnosis not present

## 2014-01-04 DIAGNOSIS — I472 Ventricular tachycardia: Secondary | ICD-10-CM | POA: Diagnosis not present

## 2014-01-04 DIAGNOSIS — I255 Ischemic cardiomyopathy: Secondary | ICD-10-CM | POA: Diagnosis not present

## 2014-01-04 DIAGNOSIS — Z79899 Other long term (current) drug therapy: Secondary | ICD-10-CM | POA: Diagnosis not present

## 2014-01-04 DIAGNOSIS — I2510 Atherosclerotic heart disease of native coronary artery without angina pectoris: Secondary | ICD-10-CM | POA: Diagnosis not present

## 2014-01-10 ENCOUNTER — Ambulatory Visit
Admission: RE | Admit: 2014-01-10 | Discharge: 2014-01-10 | Disposition: A | Discharge status: Home / Self Care | Payer: Medicare Other | Source: Ambulatory Visit | Attending: Neurology | Admitting: Neurology

## 2014-01-10 ENCOUNTER — Telehealth: Payer: Self-pay | Admitting: Neurology

## 2014-01-10 DIAGNOSIS — M4186 Other forms of scoliosis, lumbar region: Secondary | ICD-10-CM | POA: Diagnosis not present

## 2014-01-10 DIAGNOSIS — I10 Essential (primary) hypertension: Secondary | ICD-10-CM

## 2014-01-10 DIAGNOSIS — M5442 Lumbago with sciatica, left side: Secondary | ICD-10-CM

## 2014-01-10 DIAGNOSIS — M5032 Other cervical disc degeneration, mid-cervical region: Secondary | ICD-10-CM | POA: Diagnosis not present

## 2014-01-10 DIAGNOSIS — I4729 Other ventricular tachycardia: Secondary | ICD-10-CM

## 2014-01-10 DIAGNOSIS — M5137 Other intervertebral disc degeneration, lumbosacral region: Secondary | ICD-10-CM | POA: Diagnosis not present

## 2014-01-10 DIAGNOSIS — M1990 Unspecified osteoarthritis, unspecified site: Secondary | ICD-10-CM

## 2014-01-10 DIAGNOSIS — R519 Headache, unspecified: Secondary | ICD-10-CM

## 2014-01-10 DIAGNOSIS — I5020 Unspecified systolic (congestive) heart failure: Secondary | ICD-10-CM

## 2014-01-10 DIAGNOSIS — I472 Ventricular tachycardia: Secondary | ICD-10-CM

## 2014-01-10 DIAGNOSIS — R269 Unspecified abnormalities of gait and mobility: Secondary | ICD-10-CM

## 2014-01-10 DIAGNOSIS — M47817 Spondylosis without myelopathy or radiculopathy, lumbosacral region: Secondary | ICD-10-CM | POA: Diagnosis not present

## 2014-01-10 DIAGNOSIS — M47812 Spondylosis without myelopathy or radiculopathy, cervical region: Secondary | ICD-10-CM | POA: Diagnosis not present

## 2014-01-10 DIAGNOSIS — R51 Headache: Secondary | ICD-10-CM

## 2014-01-10 NOTE — Telephone Encounter (Signed)
I have called Patient, [name], please give him a follow up visit  1. Cervical spondylosis and degenerative disc disease, causing moderate to prominent impingement at C6-7 ; moderate impingement at C4-5; and mild impingement at C2-3, as detailed above. 2. Loss of the normal cervical lordosis, which can be associated with muscle spasm. 3. Small right mastoid effusion. 4. Atherosclerosis.   1. Lumbar spondylosis and degenerative disc disease, causing mild impingement at the L1-2, L4-5, and L5-S1 levels as detailed above. 2. Levoconvex lumbar scoliosis with rotary component. 3. Possible Baastrup's disease at L3-4 and L4-5. 4. Aortoiliac atherosclerotic vascular disease. Please note that while posterior aortic wall calcifications are observed, much of the abdominal aorta is not included, and today's exam does not rule out aneurysm.

## 2014-01-11 NOTE — Telephone Encounter (Signed)
Spoke to patient's wife and his follow has been scheduled.

## 2014-01-25 ENCOUNTER — Encounter: Payer: Self-pay | Admitting: Neurology

## 2014-01-25 ENCOUNTER — Ambulatory Visit (INDEPENDENT_AMBULATORY_CARE_PROVIDER_SITE_OTHER): Payer: Medicare Other | Admitting: Neurology

## 2014-01-25 VITALS — BP 120/68 | HR 65 | Ht 68.0 in | Wt 205.0 lb

## 2014-01-25 DIAGNOSIS — I472 Ventricular tachycardia: Secondary | ICD-10-CM

## 2014-01-25 DIAGNOSIS — M5442 Lumbago with sciatica, left side: Secondary | ICD-10-CM

## 2014-01-25 DIAGNOSIS — I10 Essential (primary) hypertension: Secondary | ICD-10-CM | POA: Diagnosis not present

## 2014-01-25 DIAGNOSIS — I4729 Other ventricular tachycardia: Secondary | ICD-10-CM

## 2014-01-25 DIAGNOSIS — I5020 Unspecified systolic (congestive) heart failure: Secondary | ICD-10-CM | POA: Diagnosis not present

## 2014-01-25 DIAGNOSIS — R269 Unspecified abnormalities of gait and mobility: Secondary | ICD-10-CM | POA: Diagnosis not present

## 2014-01-25 DIAGNOSIS — M1990 Unspecified osteoarthritis, unspecified site: Secondary | ICD-10-CM

## 2014-01-25 NOTE — Progress Notes (Signed)
PATIENT: [name] DOB: 1940/08/04  HISTORICAL  [name] is a 74 years old male, was referred by his primary care physician PA Cyndi Bender, [facility] in [place] for evaluation of bilateral lower extremity weakness  He has a past medical history of rheumatoid arthritis, involving multiple sites, with positive rheumatoid factor, CCP, he has been treated with methotrexate 2.5 mg 6 tablets every week for couple years, continues to have joints pain, also with past medical history of hypertension, coronary artery disease, status post MI in July 2014,  he went into congestive heart failure, require cardiac pump at [facility], followed by pacemaker placement,  He complains of chronic low back pain, radiating pain to his left lower extremity for more than 40 years, he injured his back then after heavy lifting, over the past 40 years, he had intermittent flareup of worsening low back pain, radiating pain to her left leg, numbness tingling, getting worse since his heart attack in July 2014, now with dense left lower extremity numbness, also similar, but milder involvement of right leg, he has numbness from knee down, he denies bilateral upper extremity paresthesia or weakness, he also has obesity, lack of stamina, shortness of breath with minimum exertion  He denies visual change, he denies significant neck pain, no bowel and bladder incontinence.   UPDATE Jan 25 2014: Patient continued to complain bilateral lower extremity weakness, especially with prolonged walking, left leg worse than right, deep achy pain, weakness, resting for few minutes, he can continue to walk longer distance We have reviewed CAT scan of lumbar January 2016, lumbar spondylosis and degenerative disc disease, causing mild impingement at the L1-2, L4-5, and L5-S1 levels. Levoconvex lumbar scoliosis with rotary component. Possible Baastrup's disease at L3-4 and L4-5. Aortoiliac atherosclerotic vascular disease   CT  cervical: cervical spondylosis and degenerative disc disease, causing moderate to prominent impingement at C6-7 ; moderate impingement at C4-5; and mild impingement at C2-3, as detailed above. Loss of the normal cervical lordosis, which can be associated with muscle spasm. Small right mastoid effusion Atherosclerosis.  He denies significant pain   REVIEW OF SYSTEMS: Full 14 system review of systems performed and notable only for fatigue, swelling in legs, hearing loss, blurry vision, shortness of breath, snoring, feeling cold, joints pain, allergy, numbness, weakness, dizziness, sleepiness, restless leg, decreased energy, change in appetite.   ALLERGIES: Allergies  Allergen Reactions  . Nitroglycerin Other (See Comments)    "head ache, worse than a migraine"  . Pravastatin Other (See Comments)    HOME MEDICATIONS: Current Outpatient Prescriptions on File Prior to Visit  Medication Sig Dispense Refill  . Acetaminophen (TYLENOL PO) Take by mouth as needed.    [name] amiodarone (PACERONE) 200 MG tablet Take 400 mg by mouth daily.     [name] aspirin EC 81 MG tablet Take 81 mg by mouth daily.    . cetirizine (ZYRTEC) 10 MG tablet Take 10 mg by mouth daily.    . Cholecalciferol (VITAMIN D3) 2000 UNITS TABS Take by mouth daily.    Mariane Baumgarten Sodium (COLACE PO) Take by mouth daily.    . fluticasone (FLONASE) 50 MCG/ACT nasal spray Place into both nostrils daily.    . furosemide (LASIX) 40 MG tablet Take 40 mg by mouth as needed.     [name] losartan (COZAAR) 50 MG tablet Take 50 mg by mouth daily.    . nitroGLYCERIN (NITROSTAT) 0.4 MG SL tablet Place 0.4 mg under the tongue every 5 (five) minutes  as needed for chest pain.    [name] spironolactone (ALDACTONE) 25 MG tablet Take 25 mg by mouth daily.     No current facility-administered medications on file prior to visit.    PAST MEDICAL HISTORY: Past Medical History  Diagnosis Date  . Leg weakness   . Balance problem   . Arthritis   . Sinus headache   .  High cholesterol     PAST SURGICAL HISTORY: Past Surgical History  Procedure Laterality Date  . Cardiac surgery  1992  . Stents  July 2014  . Heart pump    . Pace maker      FAMILY HISTORY: Family History  Problem Relation Age of Onset  . Alzheimer's disease Mother     SOCIAL HISTORY:  History   Social History  . Marital Status: Married    Spouse Name: Dian    Number of Children: 3  . Years of Education: 12   Occupational History    Retired   Social History Main Topics  . Smoking status: Never Smoker   . Smokeless tobacco: Never Used  . Alcohol Use: No  . Drug Use: No  . Sexual Activity: Not on file   Other Topics Concern  . Not on file   Social History Narrative   Patient lives at home with his wife [name]).   Retired.   Education 12th grade.   Right handed.   Caffeine one cup daily.     PHYSICAL EXAM   Filed Vitals:   01/25/14 0924  BP: 120/68  Pulse: 65  Height: 5\' 8"  (1.727 m)  Weight: 205 lb (92.987 kg)    Not recorded      Body mass index is 31.18 kg/(m^2).   Generalized: In no acute distress  Neck: Supple, no carotid bruits   Cardiac: Regular rate rhythm  Pulmonary: Clear to auscultation bilaterally  Musculoskeletal: No deformity  Neurological examination  Mentation: Alert oriented to time, place, history taking, and causual conversation, Obese   Cranial nerve II-XII: Pupils were equal round reactive to light. Extraocular movements were full.  Visual field were full on confrontational test. Bilateral fundi were sharp.  Facial sensation and strength were normal. Hearing was intact to finger rubbing bilaterally. Uvula tongue midline.  Head turning and shoulder shrug and were normal and symmetric.Tongue protrusion into cheek strength was normal.  Motor: He has mild bilateral hip flexion, ankle flexion weakness  Sensory: Length dependent decreased fine touch, pinprick to knee level, absent toe vibratory  sensation,  Coordination:  Normal finger to nose, heel-to-shin bilaterally there was no truncal ataxia  Gait: Rising up from seated positioby pushing on chair arm, cautious, has difficulty with heel walking, more on left side   Romberg signs: Negative  Deep tendon reflexes: Brachioradialis 2/2, biceps 2/2, triceps 2/2, patellar 2/2, Achilles  absent , plantar responses were flexor bilaterally.   DIAGNOSTIC DATA (LABS, IMAGING, TESTING) - I reviewed patient records, labs, notes, testing and imaging myself where available.  ASSESSMENT AND PLAN  AUNDREY ELAHI is a 74 y.o. male with past medical history of rheumatoid arthritis, presenting with chronic low back pain, radiating pain to left lower extremity, gait difficulty, length dependent sensory changes, brisk upper extremity, and patellar reflexes,  He also has significant comorbidity, hypertension, coronary artery disease, status post pacemaker  1, his gait difficulty is combination of peripheral neuropathy, component of lumbar radiculopathy,cervical spondylitic myelopathy, history also suggestive of a vascular claudication component.  2.Continue moderate exercise  3. Return to  clinic as needed   Marcial Pacas, M.D. Ph.D.  [facility] [place], [place] [address] Phone: [phone] Fax:      [phone]

## 2014-02-06 DIAGNOSIS — B370 Candidal stomatitis: Secondary | ICD-10-CM | POA: Diagnosis not present

## 2014-02-06 DIAGNOSIS — R432 Parageusia: Secondary | ICD-10-CM | POA: Diagnosis not present

## 2014-02-06 DIAGNOSIS — K5792 Diverticulitis of intestine, part unspecified, without perforation or abscess without bleeding: Secondary | ICD-10-CM | POA: Diagnosis not present

## 2014-02-21 DIAGNOSIS — I214 Non-ST elevation (NSTEMI) myocardial infarction: Secondary | ICD-10-CM | POA: Diagnosis not present

## 2014-02-21 DIAGNOSIS — I255 Ischemic cardiomyopathy: Secondary | ICD-10-CM | POA: Diagnosis not present

## 2014-02-21 DIAGNOSIS — Z95810 Presence of automatic (implantable) cardiac defibrillator: Secondary | ICD-10-CM | POA: Diagnosis not present

## 2014-02-21 DIAGNOSIS — I472 Ventricular tachycardia: Secondary | ICD-10-CM | POA: Diagnosis not present

## 2014-03-20 DIAGNOSIS — I2510 Atherosclerotic heart disease of native coronary artery without angina pectoris: Secondary | ICD-10-CM | POA: Diagnosis not present

## 2014-03-20 DIAGNOSIS — I472 Ventricular tachycardia: Secondary | ICD-10-CM | POA: Diagnosis not present

## 2014-03-20 DIAGNOSIS — I5020 Unspecified systolic (congestive) heart failure: Secondary | ICD-10-CM | POA: Diagnosis not present

## 2014-03-20 DIAGNOSIS — Z95810 Presence of automatic (implantable) cardiac defibrillator: Secondary | ICD-10-CM | POA: Diagnosis not present

## 2014-04-03 DIAGNOSIS — C44399 Other specified malignant neoplasm of skin of other parts of face: Secondary | ICD-10-CM | POA: Diagnosis not present

## 2014-04-03 DIAGNOSIS — L570 Actinic keratosis: Secondary | ICD-10-CM | POA: Diagnosis not present

## 2014-04-03 DIAGNOSIS — Z85828 Personal history of other malignant neoplasm of skin: Secondary | ICD-10-CM | POA: Diagnosis not present

## 2014-04-03 DIAGNOSIS — Z681 Body mass index (BMI) 19 or less, adult: Secondary | ICD-10-CM | POA: Diagnosis not present

## 2014-04-03 DIAGNOSIS — L98499 Non-pressure chronic ulcer of skin of other sites with unspecified severity: Secondary | ICD-10-CM | POA: Diagnosis not present

## 2014-04-03 DIAGNOSIS — D485 Neoplasm of uncertain behavior of skin: Secondary | ICD-10-CM | POA: Diagnosis not present

## 2014-04-03 DIAGNOSIS — D0439 Carcinoma in situ of skin of other parts of face: Secondary | ICD-10-CM | POA: Diagnosis not present

## 2014-04-06 DIAGNOSIS — Z125 Encounter for screening for malignant neoplasm of prostate: Secondary | ICD-10-CM | POA: Diagnosis not present

## 2014-04-06 DIAGNOSIS — J309 Allergic rhinitis, unspecified: Secondary | ICD-10-CM | POA: Diagnosis not present

## 2014-04-06 DIAGNOSIS — I2510 Atherosclerotic heart disease of native coronary artery without angina pectoris: Secondary | ICD-10-CM | POA: Diagnosis not present

## 2014-04-06 DIAGNOSIS — I509 Heart failure, unspecified: Secondary | ICD-10-CM | POA: Diagnosis not present

## 2014-04-06 DIAGNOSIS — I10 Essential (primary) hypertension: Secondary | ICD-10-CM | POA: Diagnosis not present

## 2014-04-06 DIAGNOSIS — Z0000 Encounter for general adult medical examination without abnormal findings: Secondary | ICD-10-CM | POA: Diagnosis not present

## 2014-04-06 DIAGNOSIS — G4733 Obstructive sleep apnea (adult) (pediatric): Secondary | ICD-10-CM | POA: Diagnosis not present

## 2014-04-06 DIAGNOSIS — Z79899 Other long term (current) drug therapy: Secondary | ICD-10-CM | POA: Diagnosis not present

## 2014-04-06 DIAGNOSIS — B370 Candidal stomatitis: Secondary | ICD-10-CM | POA: Diagnosis not present

## 2014-04-06 DIAGNOSIS — E780 Pure hypercholesterolemia: Secondary | ICD-10-CM | POA: Diagnosis not present

## 2014-04-18 DIAGNOSIS — H01003 Unspecified blepharitis right eye, unspecified eyelid: Secondary | ICD-10-CM | POA: Diagnosis not present

## 2014-04-18 DIAGNOSIS — H18003 Unspecified corneal deposit, bilateral: Secondary | ICD-10-CM | POA: Diagnosis not present

## 2014-04-18 DIAGNOSIS — Z961 Presence of intraocular lens: Secondary | ICD-10-CM | POA: Diagnosis not present

## 2014-04-18 DIAGNOSIS — H40013 Open angle with borderline findings, low risk, bilateral: Secondary | ICD-10-CM | POA: Diagnosis not present

## 2014-04-29 NOTE — Discharge Summary (Signed)
PATIENT NAME:  [name], [name] MR#:  [id number] DATE OF BIRTH:  1940/10/11  DATE OF ADMISSION:  07/02/2013. DATE OF DISCHARGE:  07/03/2013.  DISCHARGE DIAGNOSES:  1.  Stomach pain and chest pain secondary to gastroesophageal reflux disease.  2.  History of ventricular tachycardia, on defibrillator.  3.  Coronary artery disease with history of coronary artery bypass graft.  4.  Rheumatoid arthritis.   DISCHARGE MEDICATIONS:  1.  Amiodarone 200 mg 2 tablets p.o. daily.  2.  Aspirin 81 mg p.o. daily.  3.  Cetirizine 10 mg p.o. daily.  4.  Plavix 75 mg p.o. daily.  5.  (colace   100 mg p.o. daily.  6.  Colace 100 mg p.o. daily.  7.  Fluticasone 50 mcg p.o. as needed.  8.  Folic acid 1 mg p.o. daily.  9.  Furosemide 40 mg p.o. 3 tablets once a day.  10. Losartan 50 mg p.o. daily.  11. Methotrexate 2.5 mg p.o. daily every 7 days.  12. Metoprolol 20 mg p.o. daily.  13. Multivitamin 1 tablet daily.  14. Potassium chloride 20 mEq  p.o. daily.  15. Aldactone 100 mg p.o. daily.   CONSULTATIONS:  Cardiology consult with Dr. Nehemiah Massed.   HOSPITAL COURSE: The patient is 74 year old male patient with coronary artery disease status post CABG.  Cardiac catheterization in 2014 with stent placement, history of ventricular tachycardia with pacemaker and defibrillator comes in with chest pain. The patient admitted also due to chest pain, rule out. Troponins have been negative x 3. Patient continued on his home, medications aspirin Plavix, beta blockers. The patient did not have any further chest pain. We continued his home medications and he was discharged home in stable condition.   LABORATORY DATA:  CBC and BMP are within normal limits. The patient's creatinine is slightly elevated  but he is otherwise stable at the time of discharge.    TIME SPENT:  More than 30 minutes.    ____________________________ Epifanio Lesches, MD sk:lt D: 07/06/2013 22:08:07 ET T: 07/07/2013 09:30:07  ET JOB#: [id number]  cc: Epifanio Lesches, MD, <Dictator> Epifanio Lesches MD ELECTRONICALLY SIGNED 07/21/2013 18:38
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
## 2014-05-18 DIAGNOSIS — D485 Neoplasm of uncertain behavior of skin: Secondary | ICD-10-CM | POA: Diagnosis not present

## 2014-05-18 DIAGNOSIS — L578 Other skin changes due to chronic exposure to nonionizing radiation: Secondary | ICD-10-CM | POA: Diagnosis not present

## 2014-05-18 DIAGNOSIS — C44292 Other specified malignant neoplasm of skin of right ear and external auricular canal: Secondary | ICD-10-CM | POA: Diagnosis not present

## 2014-05-18 DIAGNOSIS — D0439 Carcinoma in situ of skin of other parts of face: Secondary | ICD-10-CM | POA: Diagnosis not present

## 2014-05-22 DIAGNOSIS — J45909 Unspecified asthma, uncomplicated: Secondary | ICD-10-CM | POA: Diagnosis not present

## 2014-05-22 DIAGNOSIS — Z6832 Body mass index (BMI) 32.0-32.9, adult: Secondary | ICD-10-CM | POA: Diagnosis not present

## 2014-05-22 DIAGNOSIS — R05 Cough: Secondary | ICD-10-CM | POA: Diagnosis not present

## 2014-05-22 DIAGNOSIS — J309 Allergic rhinitis, unspecified: Secondary | ICD-10-CM | POA: Diagnosis not present

## 2014-05-30 ENCOUNTER — Ambulatory Visit
Admission: RE | Admit: 2014-05-30 | Discharge: 2014-05-30 | Disposition: A | Discharge status: Home / Self Care | Payer: Medicare Other | Source: Ambulatory Visit | Attending: Family Medicine | Admitting: Family Medicine

## 2014-05-30 ENCOUNTER — Other Ambulatory Visit: Payer: Self-pay | Admitting: Family Medicine

## 2014-05-30 DIAGNOSIS — M6281 Muscle weakness (generalized): Secondary | ICD-10-CM | POA: Diagnosis not present

## 2014-05-30 DIAGNOSIS — R05 Cough: Secondary | ICD-10-CM

## 2014-05-30 DIAGNOSIS — Z1389 Encounter for screening for other disorder: Secondary | ICD-10-CM | POA: Diagnosis not present

## 2014-05-30 DIAGNOSIS — I509 Heart failure, unspecified: Secondary | ICD-10-CM

## 2014-05-30 DIAGNOSIS — M069 Rheumatoid arthritis, unspecified: Secondary | ICD-10-CM | POA: Diagnosis not present

## 2014-05-30 DIAGNOSIS — R059 Cough, unspecified: Secondary | ICD-10-CM

## 2014-05-30 DIAGNOSIS — Z950 Presence of cardiac pacemaker: Secondary | ICD-10-CM | POA: Insufficient documentation

## 2014-05-30 DIAGNOSIS — Z951 Presence of aortocoronary bypass graft: Secondary | ICD-10-CM | POA: Insufficient documentation

## 2014-05-30 DIAGNOSIS — I2510 Atherosclerotic heart disease of native coronary artery without angina pectoris: Secondary | ICD-10-CM | POA: Diagnosis not present

## 2014-05-30 DIAGNOSIS — J309 Allergic rhinitis, unspecified: Secondary | ICD-10-CM | POA: Diagnosis not present

## 2014-06-02 DIAGNOSIS — E538 Deficiency of other specified B group vitamins: Secondary | ICD-10-CM | POA: Diagnosis not present

## 2014-06-02 DIAGNOSIS — R05 Cough: Secondary | ICD-10-CM | POA: Diagnosis not present

## 2014-06-02 DIAGNOSIS — M069 Rheumatoid arthritis, unspecified: Secondary | ICD-10-CM | POA: Diagnosis not present

## 2014-06-07 DIAGNOSIS — H04123 Dry eye syndrome of bilateral lacrimal glands: Secondary | ICD-10-CM | POA: Diagnosis not present

## 2014-06-07 DIAGNOSIS — M0589 Other rheumatoid arthritis with rheumatoid factor of multiple sites: Secondary | ICD-10-CM | POA: Diagnosis not present

## 2014-06-07 DIAGNOSIS — M6281 Muscle weakness (generalized): Secondary | ICD-10-CM | POA: Diagnosis not present

## 2014-06-19 DIAGNOSIS — Z95810 Presence of automatic (implantable) cardiac defibrillator: Secondary | ICD-10-CM | POA: Diagnosis not present

## 2014-06-19 DIAGNOSIS — I472 Ventricular tachycardia: Secondary | ICD-10-CM | POA: Diagnosis not present

## 2014-06-26 DIAGNOSIS — H40013 Open angle with borderline findings, low risk, bilateral: Secondary | ICD-10-CM | POA: Diagnosis not present

## 2014-07-14 ENCOUNTER — Emergency Department: Payer: Medicare Other

## 2014-07-14 ENCOUNTER — Other Ambulatory Visit: Payer: Self-pay

## 2014-07-14 ENCOUNTER — Inpatient Hospital Stay
Admission: EM | Admit: 2014-07-14 | Discharge: 2014-07-18 | DRG: 280 | Disposition: A | Discharge status: Home / Self Care | Payer: Medicare Other | Attending: Internal Medicine | Admitting: Internal Medicine

## 2014-07-14 DIAGNOSIS — I6523 Occlusion and stenosis of bilateral carotid arteries: Secondary | ICD-10-CM | POA: Diagnosis not present

## 2014-07-14 DIAGNOSIS — M069 Rheumatoid arthritis, unspecified: Secondary | ICD-10-CM | POA: Diagnosis present

## 2014-07-14 DIAGNOSIS — Z7951 Long term (current) use of inhaled steroids: Secondary | ICD-10-CM | POA: Diagnosis not present

## 2014-07-14 DIAGNOSIS — Z951 Presence of aortocoronary bypass graft: Secondary | ICD-10-CM | POA: Diagnosis not present

## 2014-07-14 DIAGNOSIS — Z888 Allergy status to other drugs, medicaments and biological substances status: Secondary | ICD-10-CM | POA: Diagnosis not present

## 2014-07-14 DIAGNOSIS — I4901 Ventricular fibrillation: Secondary | ICD-10-CM | POA: Diagnosis present

## 2014-07-14 DIAGNOSIS — I252 Old myocardial infarction: Secondary | ICD-10-CM | POA: Diagnosis not present

## 2014-07-14 DIAGNOSIS — I5022 Chronic systolic (congestive) heart failure: Secondary | ICD-10-CM | POA: Diagnosis present

## 2014-07-14 DIAGNOSIS — H8113 Benign paroxysmal vertigo, bilateral: Secondary | ICD-10-CM | POA: Diagnosis not present

## 2014-07-14 DIAGNOSIS — I959 Hypotension, unspecified: Secondary | ICD-10-CM | POA: Diagnosis not present

## 2014-07-14 DIAGNOSIS — S0083XA Contusion of other part of head, initial encounter: Secondary | ICD-10-CM | POA: Diagnosis present

## 2014-07-14 DIAGNOSIS — Z79899 Other long term (current) drug therapy: Secondary | ICD-10-CM

## 2014-07-14 DIAGNOSIS — Z95810 Presence of automatic (implantable) cardiac defibrillator: Secondary | ICD-10-CM

## 2014-07-14 DIAGNOSIS — E780 Pure hypercholesterolemia: Secondary | ICD-10-CM | POA: Diagnosis present

## 2014-07-14 DIAGNOSIS — R269 Unspecified abnormalities of gait and mobility: Secondary | ICD-10-CM | POA: Diagnosis present

## 2014-07-14 DIAGNOSIS — I2510 Atherosclerotic heart disease of native coronary artery without angina pectoris: Secondary | ICD-10-CM | POA: Diagnosis present

## 2014-07-14 DIAGNOSIS — R42 Dizziness and giddiness: Secondary | ICD-10-CM

## 2014-07-14 DIAGNOSIS — Z7982 Long term (current) use of aspirin: Secondary | ICD-10-CM | POA: Diagnosis not present

## 2014-07-14 DIAGNOSIS — R55 Syncope and collapse: Secondary | ICD-10-CM | POA: Diagnosis not present

## 2014-07-14 DIAGNOSIS — H8110 Benign paroxysmal vertigo, unspecified ear: Secondary | ICD-10-CM | POA: Diagnosis present

## 2014-07-14 DIAGNOSIS — Y92009 Unspecified place in unspecified non-institutional (private) residence as the place of occurrence of the external cause: Secondary | ICD-10-CM | POA: Diagnosis not present

## 2014-07-14 DIAGNOSIS — S0990XA Unspecified injury of head, initial encounter: Secondary | ICD-10-CM

## 2014-07-14 DIAGNOSIS — W19XXXA Unspecified fall, initial encounter: Secondary | ICD-10-CM | POA: Diagnosis present

## 2014-07-14 DIAGNOSIS — Z9181 History of falling: Secondary | ICD-10-CM | POA: Diagnosis not present

## 2014-07-14 DIAGNOSIS — S199XXA Unspecified injury of neck, initial encounter: Secondary | ICD-10-CM | POA: Diagnosis not present

## 2014-07-14 DIAGNOSIS — I214 Non-ST elevation (NSTEMI) myocardial infarction: Principal | ICD-10-CM | POA: Diagnosis present

## 2014-07-14 DIAGNOSIS — R079 Chest pain, unspecified: Secondary | ICD-10-CM

## 2014-07-14 DIAGNOSIS — Z820 Family history of epilepsy and other diseases of the nervous system: Secondary | ICD-10-CM

## 2014-07-14 DIAGNOSIS — R112 Nausea with vomiting, unspecified: Secondary | ICD-10-CM | POA: Diagnosis not present

## 2014-07-14 DIAGNOSIS — I700 Atherosclerosis of aorta: Secondary | ICD-10-CM | POA: Diagnosis not present

## 2014-07-14 DIAGNOSIS — Z955 Presence of coronary angioplasty implant and graft: Secondary | ICD-10-CM

## 2014-07-14 DIAGNOSIS — J9811 Atelectasis: Secondary | ICD-10-CM | POA: Diagnosis not present

## 2014-07-14 HISTORY — DX: Heart failure, unspecified: I50.9

## 2014-07-14 HISTORY — DX: Presence of automatic (implantable) cardiac defibrillator: Z95.810

## 2014-07-14 HISTORY — DX: Atherosclerotic heart disease of native coronary artery without angina pectoris: I25.10

## 2014-07-14 HISTORY — DX: Acute myocardial infarction, unspecified: I21.9

## 2014-07-14 HISTORY — DX: Cardiac arrhythmia, unspecified: I49.9

## 2014-07-14 HISTORY — DX: Systemic involvement of connective tissue, unspecified: M35.9

## 2014-07-14 LAB — COMPREHENSIVE METABOLIC PANEL
ALBUMIN: 3 g/dL — AB (ref 3.5–5.0)
ALT: 129 U/L — AB (ref 17–63)
AST: 140 U/L — AB (ref 15–41)
Alkaline Phosphatase: 63 U/L (ref 38–126)
Anion gap: 13 (ref 5–15)
BUN: 29 mg/dL — ABNORMAL HIGH (ref 6–20)
CALCIUM: 8.6 mg/dL — AB (ref 8.9–10.3)
CO2: 23 mmol/L (ref 22–32)
Chloride: 100 mmol/L — ABNORMAL LOW (ref 101–111)
Creatinine, Ser: 1.34 mg/dL — ABNORMAL HIGH (ref 0.61–1.24)
GFR calc Af Amer: 59 mL/min — ABNORMAL LOW (ref >60)
GFR, EST NON AFRICAN AMERICAN: 51 mL/min — AB (ref >60)
Glucose, Bld: 167 mg/dL — ABNORMAL HIGH (ref 65–99)
Potassium: 4.3 mmol/L (ref 3.5–5.1)
SODIUM: 136 mmol/L (ref 135–145)
TOTAL PROTEIN: 6.8 g/dL (ref 6.5–8.1)
Total Bilirubin: 0.6 mg/dL (ref 0.3–1.2)

## 2014-07-14 LAB — CBC
HEMATOCRIT: 38.9 % — AB (ref 40.0–52.0)
Hemoglobin: 12.8 g/dL — ABNORMAL LOW (ref 13.0–18.0)
MCH: 29.7 pg (ref 26.0–34.0)
MCHC: 32.8 g/dL (ref 32.0–36.0)
MCV: 90.5 fL (ref 80.0–100.0)
Platelets: 203 10*3/uL (ref 150–440)
RBC: 4.3 MIL/uL — ABNORMAL LOW (ref 4.40–5.90)
RDW: 14.2 % (ref 11.5–14.5)
WBC: 11.9 10*3/uL — AB (ref 3.8–10.6)

## 2014-07-14 LAB — TYPE AND SCREEN
ABO/RH(D): A POS
Antibody Screen: NEGATIVE

## 2014-07-14 LAB — PROTIME-INR
INR: 1.09
PROTHROMBIN TIME: 14.3 s (ref 11.4–15.0)

## 2014-07-14 LAB — TROPONIN I: Troponin I: 0.03 ng/mL (ref <0.031)

## 2014-07-14 LAB — APTT: aPTT: 23 seconds — ABNORMAL LOW (ref 24–36)

## 2014-07-14 MED ORDER — MORPHINE SULFATE 4 MG/ML IJ SOLN
4.0000 mg | Freq: Once | INTRAMUSCULAR | Status: AC
  Administered 2014-07-14: 4 mg via INTRAVENOUS

## 2014-07-14 MED ORDER — ONDANSETRON HCL 4 MG/2ML IJ SOLN
4.0000 mg | Freq: Once | INTRAMUSCULAR | Status: AC
Start: 2014-07-14 — End: 2014-07-14
  Administered 2014-07-14: 4 mg via INTRAVENOUS

## 2014-07-14 MED ORDER — IOHEXOL 350 MG/ML SOLN
100.0000 mL | Freq: Once | INTRAVENOUS | Status: AC | PRN
  Administered 2014-07-14: 100 mL via INTRAVENOUS

## 2014-07-14 MED ORDER — ONDANSETRON HCL 4 MG/2ML IJ SOLN
4.0000 mg | Freq: Once | INTRAMUSCULAR | Status: AC
  Administered 2014-07-14: 4 mg via INTRAVENOUS

## 2014-07-14 MED ORDER — ONDANSETRON HCL 4 MG/2ML IJ SOLN
INTRAMUSCULAR | Status: AC
  Administered 2014-07-14: 4 mg via INTRAVENOUS
  Filled 2014-07-14: qty 2

## 2014-07-14 MED ORDER — SODIUM CHLORIDE 0.9 % IV SOLN
1000.0000 mL | Freq: Once | INTRAVENOUS | Status: AC
  Administered 2014-07-14: 1000 mL via INTRAVENOUS

## 2014-07-14 MED ORDER — MORPHINE SULFATE 4 MG/ML IJ SOLN
INTRAMUSCULAR | Status: AC
  Administered 2014-07-14: 4 mg via INTRAVENOUS
  Filled 2014-07-14: qty 1

## 2014-07-14 NOTE — ED Notes (Signed)
Pt to rm 15 via EMS from home.  EMS reports pt fall in bathroom and vomiting.  EMS report pt does not remember fall, pt c/o substernal CP described as stabbing, report dizziness x 2 days.  Pt groaning and vomiting upon arrival.

## 2014-07-14 NOTE — ED Provider Notes (Signed)
[facility] Emergency Department Provider Note  ____________________________________________  Time seen: On arrival, via EMS  I have reviewed the triage vital signs and the nursing notes.   HISTORY  Chief Complaint Chest Pain; Fall; and Emesis  History limited as patient is confused  HPI [name] is a 74 y.o. male presents from home after a reported fall in the bathroom. Apparently he fell and struck his head and has been having vomiting and confusion since as he is has difficulty answering my questions. EMS reports that he had complained of chest pain prior in the day as well and apparently has a history of CHF. He complains of nausea vomiting head neck and chest discomfort.     Past Medical History  Diagnosis Date  . Leg weakness   . Balance problem   . Arthritis   . Sinus headache   . High cholesterol   . Collagen vascular disease     RA since 2014  . CHF (congestive heart failure)     Patient Active Problem List   Diagnosis Date Noted  . Abnormality of gait 12/13/2013  . Low back pain 12/13/2013  . Arthritis   . Sinus headache   . Injury of kidney 12/02/2013  . Arteriosclerosis of coronary artery 12/02/2013  . Body water dehydration 12/02/2013  . Sinus infection 12/02/2013  . Idiopathic ventricular tachycardia 09/14/2012  . Heart failure, systolic 12/45/8099  . Automatic implantable cardioverter-defibrillator in situ 07/26/2012  . Acute MI, subendocardial 07/12/2012  . Essential (primary) hypertension 07/12/2012  . HLD (hyperlipidemia) 07/12/2012  . BP (high blood pressure) 07/12/2012  . Acute non-ST segment elevation myocardial infarction 07/12/2012  . Arthritis or polyarthritis, rheumatoid 07/12/2012    Past Surgical History  Procedure Laterality Date  . Cardiac surgery    . Stents    . Heart pump    . Pace maker      Current Outpatient Rx  Name  Route  Sig  Dispense  Refill  . Acetaminophen (TYLENOL PO)   Oral    Take by mouth as needed.         [name] amiodarone (PACERONE) 200 MG tablet   Oral   Take 400 mg by mouth daily.          [name] aspirin EC 81 MG tablet   Oral   Take 81 mg by mouth daily.         . carvedilol (COREG) 3.125 MG tablet   Oral   Take 3.125 mg by mouth 2 (two) times daily with a meal.         . cetirizine (ZYRTEC) 10 MG tablet   Oral   Take 10 mg by mouth daily.         . Cholecalciferol (VITAMIN D3) 2000 UNITS TABS   Oral   Take by mouth daily.         Mariane Baumgarten Sodium (COLACE PO)   Oral   Take by mouth daily.         . fluticasone (FLONASE) 50 MCG/ACT nasal spray   Each Nare   Place into both nostrils daily.         . furosemide (LASIX) 40 MG tablet   Oral   Take 40 mg by mouth as needed.          [name] losartan (COZAAR) 50 MG tablet   Oral   Take 50 mg by mouth daily.         . nitroGLYCERIN (NITROSTAT) 0.4  MG SL tablet   Sublingual   Place 0.4 mg under the tongue every 5 (five) minutes as needed for chest pain.         [name] spironolactone (ALDACTONE) 25 MG tablet   Oral   Take 25 mg by mouth daily.           Allergies Nitroglycerin and Pravastatin  Family History  Problem Relation Age of Onset  . Alzheimer's disease Mother     Social History History  Substance Use Topics  . Smoking status: Never Smoker   . Smokeless tobacco: Never Used  . Alcohol Use: No    Review of Systems  Constitutional: Negative for fever. Eyes: Negative for visual changes. ENT: Negative for sore throat Cardiovascular: Positive for chest pain Respiratory: Negative for shortness of breath. Gastrointestinal: Positive for nausea and vomiting Genitourinary: Negative for dysuria. Musculoskeletal: Negative for back pain. Skin: Negative for rash. Positive for abrasion Neurological: Positive for headache, negative for focal weakness Psychiatric: Positive anxiety  10-point ROS otherwise  negative.  ____________________________________________   PHYSICAL EXAM:  VITAL SIGNS: ED Triage Vitals  Enc Vitals Group     BP 07/14/14 2133 134/77 mmHg     Pulse Rate 07/14/14 2133 78     Resp 07/14/14 2133 20     Temp 07/14/14 2133 97.7 F (36.5 C)     Temp Source 07/14/14 2133 Oral     SpO2 07/14/14 2133 94 %     Weight 07/14/14 2133 197 lb 11.2 oz (89.676 kg)     Height 07/14/14 2133 6' (1.829 m)     Head Cir --      Peak Flow --      Pain Score 07/14/14 2136 6     Pain Loc --      Pain Edu? --      Excl. in Foley? --      Constitutional: Ill-appearing, vomiting Eyes: Conjunctivae are normal.  PERRLA ENT   Head: Abrasion to the posterior scalp   Mouth/Throat: Mucous membranes are moist. Cardiovascular: Normal rate, regular rhythm. Normal and symmetric distal pulses are present in all extremities. No murmurs, rubs, or gallops. Respiratory: Normal respiratory effort without tachypnea nor retractions. Breath sounds are clear and equal bilaterally.  Gastrointestinal: Soft and non-tender in all quadrants. No distention. There is no CVA tenderness. Genitourinary: deferred Musculoskeletal: Nontender with normal range of motion in all extremities. No lower extremity tenderness nor edema. Neurologic:  . No gross focal neurologic deficits are appreciated. She is able to move all extremities Skin:  Skin is warm, dry and intact. No rash noted. Psychiatric: Patient is anxious.  ____________________________________________    LABS (pertinent positives/negatives)  Labs Reviewed  CBC - Abnormal; Notable for the following:    WBC 11.9 (*)    RBC 4.30 (*)    Hemoglobin 12.8 (*)    HCT 38.9 (*)    All other components within normal limits  COMPREHENSIVE METABOLIC PANEL - Abnormal; Notable for the following:    Chloride 100 (*)    Glucose, Bld 167 (*)    BUN 29 (*)    Creatinine, Ser 1.34 (*)    Calcium 8.6 (*)    Albumin 3.0 (*)    AST 140 (*)    ALT 129 (*)     GFR calc non Af Amer 51 (*)    GFR calc Af Amer 59 (*)    All other components within normal limits  APTT - Abnormal; Notable for the following:  aPTT 23 (*)    All other components within normal limits  TROPONIN I  PROTIME-INR  TYPE AND SCREEN  ABO/RH    ____________________________________________   EKG  ED ECG REPORT I, Lavonia Drafts, the attending physician, personally viewed and interpreted this ECG.   Date: 07/14/2014  EKG Time: 9:37 PM  Rate: 78  Rhythm: normal sinus rhythm, 1st degree AV block  Axis: Normal axis  Intervals:right bundle branch block  ST&T Change: Nonspecific   ____________________________________________    RADIOLOGY I have personally reviewed any xrays that were ordered on this patient: CT head cervical spine and chest were ordered  ____________________________________________   PROCEDURES  Procedure(s) performed: none  Critical Care performed: none  ____________________________________________   INITIAL IMPRESSION / ASSESSMENT AND PLAN / ED COURSE  Pertinent labs & imaging results that were available during my care of the patient were reviewed by me and considered in my medical decision making (see chart for details).  Patient appears in distress upon arrival. He seems confused and has evidence of a head injury so we will obtain a CT head but apparently is also complaining of chest discomfort. We'll check EKG troponin lab work and chest x-ray. We will give morphine and Zofran for discomfort   ----------------------------------------- 11:37 PM on 07/14/2014 -----------------------------------------  Thus far workup is relatively benign and patient seems like he feels much better. We are pending CT head and cervical spine as well as CT angiography of the chest.    Patient will require admission ____________________________________________   FINAL CLINICAL IMPRESSION(S) / ED DIAGNOSES  Final diagnoses:  Fall, initial  encounter  Head injury, initial encounter  Non-intractable vomiting with nausea, vomiting of unspecified type  Chest pain, unspecified chest pain type     Lavonia Drafts, MD 07/14/14 2339

## 2014-07-15 ENCOUNTER — Encounter: Payer: Self-pay | Admitting: Internal Medicine

## 2014-07-15 ENCOUNTER — Inpatient Hospital Stay: Payer: Medicare Other

## 2014-07-15 ENCOUNTER — Inpatient Hospital Stay
Admit: 2014-07-15 | Discharge: 2014-07-15 | Disposition: A | Discharge status: Home / Self Care | Payer: Medicare Other | Attending: Internal Medicine | Admitting: Internal Medicine

## 2014-07-15 ENCOUNTER — Inpatient Hospital Stay: Admit: 2014-07-15 | Payer: Medicare Other

## 2014-07-15 DIAGNOSIS — Z7951 Long term (current) use of inhaled steroids: Secondary | ICD-10-CM | POA: Diagnosis not present

## 2014-07-15 DIAGNOSIS — Y92009 Unspecified place in unspecified non-institutional (private) residence as the place of occurrence of the external cause: Secondary | ICD-10-CM | POA: Diagnosis not present

## 2014-07-15 DIAGNOSIS — I252 Old myocardial infarction: Secondary | ICD-10-CM | POA: Diagnosis not present

## 2014-07-15 DIAGNOSIS — Z79899 Other long term (current) drug therapy: Secondary | ICD-10-CM | POA: Diagnosis not present

## 2014-07-15 DIAGNOSIS — Z820 Family history of epilepsy and other diseases of the nervous system: Secondary | ICD-10-CM | POA: Diagnosis not present

## 2014-07-15 DIAGNOSIS — Z951 Presence of aortocoronary bypass graft: Secondary | ICD-10-CM | POA: Diagnosis not present

## 2014-07-15 DIAGNOSIS — S0083XA Contusion of other part of head, initial encounter: Secondary | ICD-10-CM | POA: Diagnosis present

## 2014-07-15 DIAGNOSIS — H8113 Benign paroxysmal vertigo, bilateral: Secondary | ICD-10-CM | POA: Diagnosis not present

## 2014-07-15 DIAGNOSIS — M069 Rheumatoid arthritis, unspecified: Secondary | ICD-10-CM | POA: Diagnosis present

## 2014-07-15 DIAGNOSIS — I2510 Atherosclerotic heart disease of native coronary artery without angina pectoris: Secondary | ICD-10-CM | POA: Clinically undetermined

## 2014-07-15 DIAGNOSIS — Z95810 Presence of automatic (implantable) cardiac defibrillator: Secondary | ICD-10-CM | POA: Diagnosis not present

## 2014-07-15 DIAGNOSIS — I5022 Chronic systolic (congestive) heart failure: Secondary | ICD-10-CM | POA: Diagnosis present

## 2014-07-15 DIAGNOSIS — I959 Hypotension, unspecified: Secondary | ICD-10-CM | POA: Diagnosis not present

## 2014-07-15 DIAGNOSIS — R112 Nausea with vomiting, unspecified: Secondary | ICD-10-CM | POA: Diagnosis not present

## 2014-07-15 DIAGNOSIS — R079 Chest pain, unspecified: Secondary | ICD-10-CM | POA: Diagnosis present

## 2014-07-15 DIAGNOSIS — Z888 Allergy status to other drugs, medicaments and biological substances status: Secondary | ICD-10-CM | POA: Diagnosis not present

## 2014-07-15 DIAGNOSIS — Z7982 Long term (current) use of aspirin: Secondary | ICD-10-CM | POA: Diagnosis not present

## 2014-07-15 DIAGNOSIS — Z955 Presence of coronary angioplasty implant and graft: Secondary | ICD-10-CM | POA: Diagnosis not present

## 2014-07-15 DIAGNOSIS — Z9181 History of falling: Secondary | ICD-10-CM | POA: Diagnosis not present

## 2014-07-15 DIAGNOSIS — I214 Non-ST elevation (NSTEMI) myocardial infarction: Secondary | ICD-10-CM | POA: Diagnosis not present

## 2014-07-15 DIAGNOSIS — E780 Pure hypercholesterolemia: Secondary | ICD-10-CM | POA: Diagnosis present

## 2014-07-15 DIAGNOSIS — R269 Unspecified abnormalities of gait and mobility: Secondary | ICD-10-CM | POA: Diagnosis present

## 2014-07-15 DIAGNOSIS — I4901 Ventricular fibrillation: Secondary | ICD-10-CM | POA: Diagnosis present

## 2014-07-15 DIAGNOSIS — W19XXXA Unspecified fall, initial encounter: Secondary | ICD-10-CM | POA: Diagnosis present

## 2014-07-15 DIAGNOSIS — I6523 Occlusion and stenosis of bilateral carotid arteries: Secondary | ICD-10-CM | POA: Diagnosis not present

## 2014-07-15 DIAGNOSIS — R55 Syncope and collapse: Secondary | ICD-10-CM | POA: Diagnosis not present

## 2014-07-15 DIAGNOSIS — H8110 Benign paroxysmal vertigo, unspecified ear: Secondary | ICD-10-CM | POA: Diagnosis present

## 2014-07-15 LAB — TROPONIN I
TROPONIN I: 0.55 ng/mL — AB (ref <0.031)
Troponin I: 0.25 ng/mL — ABNORMAL HIGH (ref <0.031)
Troponin I: 0.32 ng/mL — ABNORMAL HIGH (ref <0.031)

## 2014-07-15 LAB — CBC
HCT: 40.2 % (ref 40.0–52.0)
Hemoglobin: 12.9 g/dL — ABNORMAL LOW (ref 13.0–18.0)
MCH: 29.6 pg (ref 26.0–34.0)
MCHC: 32.2 g/dL (ref 32.0–36.0)
MCV: 91.9 fL (ref 80.0–100.0)
Platelets: 186 10*3/uL (ref 150–440)
RBC: 4.38 MIL/uL — ABNORMAL LOW (ref 4.40–5.90)
RDW: 14.3 % (ref 11.5–14.5)
WBC: 10.3 10*3/uL (ref 3.8–10.6)

## 2014-07-15 LAB — COMPREHENSIVE METABOLIC PANEL
ALBUMIN: 2.9 g/dL — AB (ref 3.5–5.0)
ALK PHOS: 58 U/L (ref 38–126)
ALT: 122 U/L — ABNORMAL HIGH (ref 17–63)
ALT: 116 U/L — AB (ref 17–63)
AST: 132 U/L — ABNORMAL HIGH (ref 15–41)
AST: 124 U/L — AB (ref 15–41)
Albumin: 3 g/dL — ABNORMAL LOW (ref 3.5–5.0)
Alkaline Phosphatase: 62 U/L (ref 38–126)
Anion gap: 10 (ref 5–15)
Anion gap: 10 (ref 5–15)
BUN: 23 mg/dL — ABNORMAL HIGH (ref 6–20)
BUN: 22 mg/dL — ABNORMAL HIGH (ref 6–20)
CALCIUM: 8.2 mg/dL — AB (ref 8.9–10.3)
CO2: 21 mmol/L — ABNORMAL LOW (ref 22–32)
CO2: 25 mmol/L (ref 22–32)
Calcium: 8.3 mg/dL — ABNORMAL LOW (ref 8.9–10.3)
Chloride: 104 mmol/L (ref 101–111)
Chloride: 102 mmol/L (ref 101–111)
Creatinine, Ser: 0.95 mg/dL (ref 0.61–1.24)
Creatinine, Ser: 1.02 mg/dL (ref 0.61–1.24)
GFR calc Af Amer: >60 mL/min (ref >60)
GFR calc Af Amer: >60 mL/min (ref >60)
GFR calc non Af Amer: >60 mL/min (ref >60)
GFR calc non Af Amer: >60 mL/min (ref >60)
Glucose, Bld: 129 mg/dL — ABNORMAL HIGH (ref 65–99)
Glucose, Bld: 121 mg/dL — ABNORMAL HIGH (ref 65–99)
POTASSIUM: 4.5 mmol/L (ref 3.5–5.1)
Potassium: 5.8 mmol/L — ABNORMAL HIGH (ref 3.5–5.1)
SODIUM: 137 mmol/L (ref 135–145)
Sodium: 135 mmol/L (ref 135–145)
TOTAL PROTEIN: 6.3 g/dL — AB (ref 6.5–8.1)
Total Bilirubin: 1 mg/dL (ref 0.3–1.2)
Total Bilirubin: 0.4 mg/dL (ref 0.3–1.2)
Total Protein: 6.7 g/dL (ref 6.5–8.1)

## 2014-07-15 LAB — ABO/RH: ABO/RH(D): A POS

## 2014-07-15 LAB — POTASSIUM: POTASSIUM: 4.4 mmol/L (ref 3.5–5.1)

## 2014-07-15 LAB — HEPARIN LEVEL (UNFRACTIONATED): HEPARIN UNFRACTIONATED: 0.53 [IU]/mL (ref 0.30–0.70)

## 2014-07-15 LAB — GLUCOSE, CAPILLARY: GLUCOSE-CAPILLARY: 130 mg/dL — AB (ref 65–99)

## 2014-07-15 MED ORDER — LOSARTAN POTASSIUM 50 MG PO TABS
50.0000 mg | ORAL_TABLET | Freq: Every day | ORAL | Status: DC
  Administered 2014-07-16: 50 mg via ORAL
  Filled 2014-07-15 (×2): qty 1

## 2014-07-15 MED ORDER — PROMETHAZINE HCL 25 MG/ML IJ SOLN
12.5000 mg | Freq: Once | INTRAMUSCULAR | Status: DC
Start: 2014-07-15 — End: 2014-07-18

## 2014-07-15 MED ORDER — NITROGLYCERIN 0.4 MG SL SUBL: 0.4000 mg | SUBLINGUAL_TABLET | SUBLINGUAL | Status: DC | PRN

## 2014-07-15 MED ORDER — MORPHINE SULFATE 2 MG/ML IJ SOLN: 2.0000 mg | INTRAMUSCULAR | Status: DC | PRN

## 2014-07-15 MED ORDER — SODIUM POLYSTYRENE SULFONATE 15 GM/60ML PO SUSP
30.0000 g | ORAL | Status: AC
  Administered 2014-07-15: 30 g via ORAL
  Filled 2014-07-15: qty 120

## 2014-07-15 MED ORDER — MECLIZINE HCL 25 MG PO TABS
25.0000 mg | ORAL_TABLET | Freq: Three times a day (TID) | ORAL | Status: DC
  Administered 2014-07-15 – 2014-07-18 (×9): 25 mg via ORAL
  Filled 2014-07-15 (×11): qty 1

## 2014-07-15 MED ORDER — NITROGLYCERIN IN D5W 200-5 MCG/ML-% IV SOLN
10.0000 ug/min | INTRAVENOUS | Status: DC
  Filled 2014-07-15: qty 250

## 2014-07-15 MED ORDER — ONDANSETRON HCL 4 MG/2ML IJ SOLN
4.0000 mg | Freq: Four times a day (QID) | INTRAMUSCULAR | Status: DC | PRN
  Administered 2014-07-15: 4 mg via INTRAVENOUS
  Filled 2014-07-15 (×2): qty 2

## 2014-07-15 MED ORDER — ACETAMINOPHEN 325 MG PO TABS
650.0000 mg | ORAL_TABLET | Freq: Four times a day (QID) | ORAL | Status: DC | PRN
  Administered 2014-07-15 – 2014-07-18 (×3): 650 mg via ORAL
  Filled 2014-07-15 (×3): qty 2

## 2014-07-15 MED ORDER — LORATADINE 10 MG PO TABS
10.0000 mg | ORAL_TABLET | Freq: Every day | ORAL | Status: DC
  Administered 2014-07-16 – 2014-07-18 (×3): 10 mg via ORAL
  Filled 2014-07-15 (×3): qty 1

## 2014-07-15 MED ORDER — SODIUM CHLORIDE 0.9 % IJ SOLN
3.0000 mL | Freq: Two times a day (BID) | INTRAMUSCULAR | Status: DC
  Administered 2014-07-15 – 2014-07-18 (×8): 3 mL via INTRAVENOUS

## 2014-07-15 MED ORDER — ASPIRIN EC 81 MG PO TBEC
81.0000 mg | DELAYED_RELEASE_TABLET | Freq: Every day | ORAL | Status: DC
  Administered 2014-07-16 – 2014-07-18 (×3): 81 mg via ORAL
  Filled 2014-07-15 (×3): qty 1

## 2014-07-15 MED ORDER — HEPARIN BOLUS VIA INFUSION
4000.0000 [IU] | INTRAVENOUS | Status: AC
  Administered 2014-07-15: 4000 [IU] via INTRAVENOUS
  Filled 2014-07-15: qty 4000

## 2014-07-15 MED ORDER — HEPARIN (PORCINE) IN NACL 100-0.45 UNIT/ML-% IJ SOLN
1150.0000 [IU]/h | INTRAMUSCULAR | Status: DC
Start: 2014-07-15 — End: 2014-07-17
  Administered 2014-07-15 – 2014-07-17 (×3): 1150 [IU]/h via INTRAVENOUS
  Filled 2014-07-15 (×6): qty 250

## 2014-07-15 MED ORDER — ASPIRIN EC 81 MG PO TBEC: 81.0000 mg | DELAYED_RELEASE_TABLET | Freq: Every day | ORAL | Status: DC

## 2014-07-15 MED ORDER — ENOXAPARIN SODIUM 40 MG/0.4ML ~~LOC~~ SOLN
40.0000 mg | SUBCUTANEOUS | Status: DC
Start: 2014-07-15 — End: 2014-07-15

## 2014-07-15 MED ORDER — ACETAMINOPHEN 650 MG RE SUPP: 650.0000 mg | Freq: Four times a day (QID) | RECTAL | Status: DC | PRN

## 2014-07-15 MED ORDER — MECLIZINE HCL 25 MG PO TABS
25.0000 mg | ORAL_TABLET | Freq: Two times a day (BID) | ORAL | Status: DC | PRN
  Filled 2014-07-15: qty 1

## 2014-07-15 MED ORDER — IOHEXOL 350 MG/ML SOLN
85.0000 mL | Freq: Once | INTRAVENOUS | Status: AC | PRN
  Administered 2014-07-15: 100 mL via INTRAVENOUS

## 2014-07-15 MED ORDER — PROMETHAZINE HCL 25 MG/ML IJ SOLN
INTRAMUSCULAR | Status: AC
  Administered 2014-07-15: 12.5 mg
  Filled 2014-07-15: qty 1

## 2014-07-15 MED ORDER — AMIODARONE HCL 200 MG PO TABS
400.0000 mg | ORAL_TABLET | Freq: Every day | ORAL | Status: DC
  Filled 2014-07-15: qty 2

## 2014-07-15 MED ORDER — SPIRONOLACTONE 25 MG PO TABS
25.0000 mg | ORAL_TABLET | Freq: Every day | ORAL | Status: DC
  Administered 2014-07-16: 25 mg via ORAL
  Filled 2014-07-15 (×2): qty 1

## 2014-07-15 MED ORDER — CARVEDILOL 3.125 MG PO TABS
3.1250 mg | ORAL_TABLET | Freq: Two times a day (BID) | ORAL | Status: DC
  Administered 2014-07-15 – 2014-07-16 (×4): 3.125 mg via ORAL
  Filled 2014-07-15 (×5): qty 1

## 2014-07-15 MED ORDER — DOCUSATE SODIUM 100 MG PO CAPS
100.0000 mg | ORAL_CAPSULE | Freq: Every day | ORAL | Status: DC
  Administered 2014-07-16 – 2014-07-18 (×3): 100 mg via ORAL
  Filled 2014-07-15 (×3): qty 1

## 2014-07-15 MED ORDER — ONDANSETRON HCL 4 MG/2ML IJ SOLN
INTRAMUSCULAR | Status: AC
  Filled 2014-07-15: qty 2

## 2014-07-15 MED ORDER — FLUTICASONE PROPIONATE 50 MCG/ACT NA SUSP
1.0000 | Freq: Every day | NASAL | Status: DC
  Administered 2014-07-17 – 2014-07-18 (×2): 1 via NASAL
  Filled 2014-07-15: qty 16

## 2014-07-15 MED ORDER — VITAMIN D3 25 MCG (1000 UNIT) PO TABS
2000.0000 [IU] | ORAL_TABLET | Freq: Every day | ORAL | Status: DC
  Administered 2014-07-16 – 2014-07-18 (×3): 2000 [IU] via ORAL
  Filled 2014-07-15 (×6): qty 2

## 2014-07-15 MED ORDER — PROMETHAZINE HCL 25 MG/ML IJ SOLN
INTRAMUSCULAR | Status: AC
  Administered 2014-07-15: 12.5 mg via INTRAMUSCULAR
  Filled 2014-07-15: qty 1

## 2014-07-15 MED ORDER — PROMETHAZINE HCL 25 MG/ML IJ SOLN
12.5000 mg | Freq: Once | INTRAMUSCULAR | Status: AC
  Administered 2014-07-15: 12.5 mg via INTRAMUSCULAR

## 2014-07-15 MED ORDER — HYDROCODONE-ACETAMINOPHEN 5-325 MG PO TABS
1.0000 | ORAL_TABLET | ORAL | Status: DC | PRN
  Administered 2014-07-15 – 2014-07-17 (×4): 1 via ORAL
  Filled 2014-07-15 (×4): qty 1

## 2014-07-15 MED ORDER — SODIUM CHLORIDE 0.9 % IV SOLN
INTRAVENOUS | Status: AC
Start: 2014-07-15 — End: 2014-07-15
  Administered 2014-07-15 (×2): via INTRAVENOUS

## 2014-07-15 MED ORDER — AMIODARONE HCL 200 MG PO TABS
400.0000 mg | ORAL_TABLET | Freq: Two times a day (BID) | ORAL | Status: DC
  Administered 2014-07-15 – 2014-07-17 (×6): 400 mg via ORAL
  Filled 2014-07-15 (×5): qty 2

## 2014-07-15 MED ORDER — FUROSEMIDE 40 MG PO TABS
40.0000 mg | ORAL_TABLET | Freq: Every day | ORAL | Status: DC
  Administered 2014-07-16: 40 mg via ORAL
  Filled 2014-07-15 (×2): qty 1

## 2014-07-15 NOTE — Progress Notes (Signed)
   07/15/14 0900  Clinical Encounter Type  Visited With Patient and family together  Visit Type Initial  Referral From Nurse  Consult/Referral To Chaplain  Spiritual Encounters  Spiritual Needs Emotional  Stress Factors  Patient Stress Factors None identified  Family Stress Factors None identified  Chaplain Emerick Weatherly assisted patients son to CCU's waiting room while patient was situated in room. Chaplain then accompanied son to room when patient was ready and was a calming presence for patient and son.

## 2014-07-15 NOTE — Progress Notes (Signed)
*  PRELIMINARY RESULTS* Echocardiogram 2D Echocardiogram has been performed.  [name] 07/15/2014, 6:09 PM

## 2014-07-15 NOTE — Plan of Care (Signed)
Problem: Phase I Progression Outcomes Goal: Aspirin unless contraindicated Outcome: Not Met (add Reason) Aspirin contraindicated at this time

## 2014-07-15 NOTE — H&P (Signed)
[facility] NAME: [name]    MR#:  [id number]  DATE OF BIRTH:  08-Apr-1940  DATE OF ADMISSION:  07/14/2014  PRIMARY CARE PHYSICIAN: [name], MD   REQUESTING/REFERRING PHYSICIAN: Leanord Asal  CHIEF COMPLAINT:   Chief Complaint  Patient presents with  . Chest Pain  . Fall  . Emesis    HISTORY OF PRESENT ILLNESS:  [name]  is a 74 y.o. male with below mentioned past medical history was brought in by EMS to the ED following a fall at home in his bathroom secondary to imbalance and chronic gait disturbances. Patient states that while he went to the bathroom he fell down because of gait disturbances and did not remember anything further. His wife went and found the patient on the floor, was disoriented for a few minutes but later was coronary event. Patient had an episode of emesis while at home and complained headache, neck pain and chest pain. EMS was called and patient was brought to the emergency room for further evaluation. Patient had fewer episodes of emesis en route to the ED and also had few more bouts of emesis in the ED for which he received Zofran. Patient was noted to be with stable vital signs but diaphoretic and confused on arrival to the ED. Evaluation in the ED revealed EKG normal sinus rhythm with ventricular rate of 78 bpm with no acute ischemic changes, troponin 0.03, WBC 11.9, BUN/creatinine 29 over 1.34, AST/ALT 140/129. CT of the head negative for acute intracranial findings including bleed. CT of the C-spine negative for acute fracture, dislocation, positive for DJD. CT of the chest and abdomen negative for PE, aortic dissection. CT of the abdomen cholelithiasis with no evidence of cholecystitis. Patient continues to have intermittent episodes of nausea and vomiting with associated dizziness on movements of the neck. Patient also has intermittent episodes of chest pain which was relieved by  morphine. In view of his significant cardiac history and ongoing nausea and vomiting with the dizziness hospitalist service was consulted for further management. Patient is alert awake and oriented 3 now, states feels dizzy and nauseous on movements of neck but otherwise no symptoms while at rest. Denies any chest pain at this time. Off note, patient denies any chest pain, palpitations prior to the fall episode.  PAST MEDICAL HISTORY:   Past Medical History  Diagnosis Date  . Leg weakness   . Balance problem   . Arthritis   . Sinus headache   . High cholesterol   . Collagen vascular disease     RA since 2014  . CHF (congestive heart failure)   . Coronary artery disease   . Myocardial infarction   . AICD (automatic cardioverter/defibrillator) present   . Dysrhythmia     PAST SURGICAL HISTORY:   Past Surgical History  Procedure Laterality Date  . Cardiac surgery    . Stents    . Heart pump    . Psychologist, forensic    . Coronary artery bypass graft    . Coronary angioplasty    . Insert / replace / remove pacemaker      SOCIAL HISTORY:   History  Substance Use Topics  . Smoking status: Never Smoker   . Smokeless tobacco: Never Used  . Alcohol Use: No    FAMILY HISTORY:   Family History  Problem Relation Age of Onset  . Alzheimer's disease Mother     DRUG ALLERGIES:  Allergies  Allergen Reactions  . Nitroglycerin Other (See Comments)    "head ache, worse than a migraine"  . Pravastatin Other (See Comments)    REVIEW OF SYSTEMS:   Review of Systems  Constitutional: Positive for malaise/fatigue. Negative for fever and chills.  HENT: Negative for ear pain, hearing loss, nosebleeds, sore throat and tinnitus.   Eyes: Negative for blurred vision, double vision, pain, discharge and redness.  Respiratory: Negative for cough, hemoptysis, sputum production, shortness of breath and wheezing.   Cardiovascular: Positive for chest pain. Negative for palpitations, orthopnea and  leg swelling.  Gastrointestinal: Positive for nausea and vomiting. Negative for abdominal pain, diarrhea, constipation, blood in stool and melena.  Genitourinary: Negative for dysuria, urgency, frequency and hematuria.  Musculoskeletal: Positive for joint pain, falls and neck pain. Negative for back pain.  Skin: Negative for itching and rash.  Neurological: Positive for dizziness, weakness and headaches. Negative for tingling, sensory change, focal weakness and seizures.  Endo/Heme/Allergies: Does not bruise/bleed easily.  Psychiatric/Behavioral: Negative for depression. The patient is not nervous/anxious.     MEDICATIONS AT HOME:   Prior to Admission medications   Medication Sig Start Date End Date Taking? Authorizing Provider  Acetaminophen (TYLENOL PO) Take by mouth as needed.    Historical Provider, MD  amiodarone (PACERONE) 200 MG tablet Take 400 mg by mouth daily.  12/02/13   Historical Provider, MD  aspirin EC 81 MG tablet Take 81 mg by mouth daily.    Historical Provider, MD  carvedilol (COREG) 3.125 MG tablet Take 3.125 mg by mouth 2 (two) times daily with a meal.    Historical Provider, MD  cetirizine (ZYRTEC) 10 MG tablet Take 10 mg by mouth daily.    Historical Provider, MD  Cholecalciferol (VITAMIN D3) 2000 UNITS TABS Take by mouth daily.    Historical Provider, MD  Docusate Sodium (COLACE PO) Take by mouth daily.    Historical Provider, MD  fluticasone (FLONASE) 50 MCG/ACT nasal spray Place into both nostrils daily.    Historical Provider, MD  furosemide (LASIX) 40 MG tablet Take 40 mg by mouth as needed.  09/27/13   Historical Provider, MD  losartan (COZAAR) 50 MG tablet Take 50 mg by mouth daily. 12/05/13   Historical Provider, MD  nitroGLYCERIN (NITROSTAT) 0.4 MG SL tablet Place 0.4 mg under the tongue every 5 (five) minutes as needed for chest pain.    Historical Provider, MD  spironolactone (ALDACTONE) 25 MG tablet Take 25 mg by mouth daily. 11/19/13   Historical Provider,  MD      VITAL SIGNS:  Blood pressure 114/65, pulse 75, temperature 97.7 F (36.5 C), temperature source Oral, resp. rate 20, height 6' (1.829 m), weight 89.676 kg (197 lb 11.2 oz), SpO2 100 %.  PHYSICAL EXAMINATION:  Physical Exam  Constitutional: He is oriented to person, place, and time. He appears well-developed and well-nourished.  Mildly distress because of nausea and dizziness.  HENT:  Head: Normocephalic.  Right Ear: External ear normal.  Left Ear: External ear normal.  Nose: Nose normal.  Mouth/Throat: Oropharynx is clear and moist. No oropharyngeal exudate.  Superficial laceration over the occipital region of the head  Eyes: EOM are normal. Pupils are equal, round, and reactive to light. No scleral icterus.  Neck: Neck supple. No JVD present. No thyromegaly present.  Cardiovascular: Normal rate, regular rhythm, normal heart sounds and intact distal pulses.  Exam reveals no friction rub.   No murmur heard. Respiratory: Effort normal and breath sounds normal. No  respiratory distress. He has no wheezes. He has no rales. He exhibits no tenderness.  GI: Soft. Bowel sounds are normal. He exhibits no distension and no mass. There is no tenderness. There is no rebound and no guarding.  Musculoskeletal: Normal range of motion. He exhibits no edema.  Lymphadenopathy:    He has no cervical adenopathy.  Neurological: He is alert and oriented to person, place, and time. He has normal reflexes. He displays normal reflexes. No cranial nerve deficit. He exhibits normal muscle tone.  Skin: Skin is warm. No rash noted. No erythema.  Psychiatric: He has a normal mood and affect. His behavior is normal. Thought content normal.   LABORATORY PANEL:   CBC  Recent Labs Lab 07/14/14 2140  WBC 11.9*  HGB 12.8*  HCT 38.9*  PLT 203   ------------------------------------------------------------------------------------------------------------------  Chemistries   Recent Labs Lab  07/14/14 2140  NA 136  K 4.3  CL 100*  CO2 23  GLUCOSE 167*  BUN 29*  CREATININE 1.34*  CALCIUM 8.6*  AST 140*  ALT 129*  ALKPHOS 63  BILITOT 0.6   ------------------------------------------------------------------------------------------------------------------  Cardiac Enzymes  Recent Labs Lab 07/14/14 2140  TROPONINI 0.03   ------------------------------------------------------------------------------------------------------------------  RADIOLOGY:  Ct Head Wo Contrast  07/14/2014   CLINICAL DATA:  Status post fall in bathroom, with vomiting. Patient does not remember fall. Concern for cervical spine injury. Initial encounter.  EXAM: CT HEAD WITHOUT CONTRAST  CT CERVICAL SPINE WITHOUT CONTRAST  TECHNIQUE: Multidetector CT imaging of the head and cervical spine was performed following the standard protocol without intravenous contrast. Multiplanar CT image reconstructions of the cervical spine were also generated.  COMPARISON:  CT of the cervical spine performed 01/10/2014  FINDINGS: CT HEAD FINDINGS  There is no evidence of acute infarction, mass lesion, or intra- or extra-axial hemorrhage on CT.  Prominence of the ventricles and sulci reflects mild cortical volume loss. Mild cerebellar atrophy is noted. Scattered periventricular white matter change likely reflects small vessel ischemic microangiopathy.  The brainstem and fourth ventricle are within normal limits. The basal ganglia are unremarkable in appearance. The cerebral hemispheres demonstrate grossly normal gray-white differentiation. No mass effect or midline shift is seen.  There is no evidence of fracture; visualized osseous structures are unremarkable in appearance. The visualized portions of the orbits are within normal limits. There is minimal partial opacification of the right mastoid air cells. Mild inspissated mucus is noted at the base of the right maxillary sinus. The remaining paranasal sinuses and left mastoid air  cells are well-aerated. No significant soft tissue abnormalities are seen.  CT CERVICAL SPINE FINDINGS  There is no evidence of fracture or subluxation. Vertebral bodies demonstrate normal height and alignment. Mild disc space narrowing is noted at C4-C5 and at C6-C7, with small anterior and posterior disc osteophyte complexes. Prevertebral soft tissues are within normal limits.  The thyroid gland is unremarkable in appearance. The visualized lung apices are clear. Calcification is noted about the carotid bifurcations bilaterally.  IMPRESSION: 1. No evidence of traumatic intracranial injury or fracture. 2. No evidence of fracture or subluxation along the cervical spine. 3. Mild cortical volume loss and scattered small vessel ischemic microangiopathy. 4. Minimal partial opacification of the right mastoid air cells, and mild inspissated mucus at the base of the right maxillary sinus. 5. Calcification about the carotid bifurcations bilaterally. Carotid ultrasound would be helpful for further evaluation, when and as deemed clinically appropriate.   Electronically Signed   By: Francoise Schaumann.D.  On: 07/14/2014 23:41   Ct Cervical Spine Wo Contrast  07/14/2014   CLINICAL DATA:  Status post fall in bathroom, with vomiting. Patient does not remember fall. Concern for cervical spine injury. Initial encounter.  EXAM: CT HEAD WITHOUT CONTRAST  CT CERVICAL SPINE WITHOUT CONTRAST  TECHNIQUE: Multidetector CT imaging of the head and cervical spine was performed following the standard protocol without intravenous contrast. Multiplanar CT image reconstructions of the cervical spine were also generated.  COMPARISON:  CT of the cervical spine performed 01/10/2014  FINDINGS: CT HEAD FINDINGS  There is no evidence of acute infarction, mass lesion, or intra- or extra-axial hemorrhage on CT.  Prominence of the ventricles and sulci reflects mild cortical volume loss. Mild cerebellar atrophy is noted. Scattered periventricular white  matter change likely reflects small vessel ischemic microangiopathy.  The brainstem and fourth ventricle are within normal limits. The basal ganglia are unremarkable in appearance. The cerebral hemispheres demonstrate grossly normal gray-white differentiation. No mass effect or midline shift is seen.  There is no evidence of fracture; visualized osseous structures are unremarkable in appearance. The visualized portions of the orbits are within normal limits. There is minimal partial opacification of the right mastoid air cells. Mild inspissated mucus is noted at the base of the right maxillary sinus. The remaining paranasal sinuses and left mastoid air cells are well-aerated. No significant soft tissue abnormalities are seen.  CT CERVICAL SPINE FINDINGS  There is no evidence of fracture or subluxation. Vertebral bodies demonstrate normal height and alignment. Mild disc space narrowing is noted at C4-C5 and at C6-C7, with small anterior and posterior disc osteophyte complexes. Prevertebral soft tissues are within normal limits.  The thyroid gland is unremarkable in appearance. The visualized lung apices are clear. Calcification is noted about the carotid bifurcations bilaterally.  IMPRESSION: 1. No evidence of traumatic intracranial injury or fracture. 2. No evidence of fracture or subluxation along the cervical spine. 3. Mild cortical volume loss and scattered small vessel ischemic microangiopathy. 4. Minimal partial opacification of the right mastoid air cells, and mild inspissated mucus at the base of the right maxillary sinus. 5. Calcification about the carotid bifurcations bilaterally. Carotid ultrasound would be helpful for further evaluation, when and as deemed clinically appropriate.   Electronically Signed   By: Garald Balding M.D.   On: 07/14/2014 23:41   Dg Chest Portable 1 View  07/14/2014   CLINICAL DATA:  Chest pain  EXAM: PORTABLE CHEST - 1 VIEW  COMPARISON:  05/30/2014  FINDINGS: Stable positioning  of single chamber pacer/ ICD into the right ventricle. Stable heart size and mediastinal contours post CABG. There is no edema, consolidation, effusion, or pneumothorax. Remote and healed posterior right rib fractures.  IMPRESSION: No evidence of active disease   Electronically Signed   By: Monte Fantasia M.D.   On: 07/14/2014 21:53   Ct Angio Chest Aorta W/cm &/or Wo/cm  07/14/2014   CLINICAL DATA:  Severe chest pain  EXAM: CT ANGIOGRAPHY CHEST WITH CONTRAST  TECHNIQUE: Multidetector CT imaging of the chest was performed using the standard protocol during bolus administration of intravenous contrast. Multiplanar CT image reconstructions and MIPs were obtained to evaluate the vascular anatomy.  CONTRAST:  167mL OMNIPAQUE IOHEXOL 350 MG/ML SOLN  COMPARISON:  None.  FINDINGS: THORACIC INLET/BODY WALL:  Single chamber ICD/pacer from the left is in unremarkable position, tip at the right ventricular apex.  MEDIASTINUM:  Normal heart size. No pericardial effusion. No acute vascular abnormality, including aortic dissection or intramural hematoma.  No central pulmonary embolism. Extensive atherosclerosis, including the coronary arteries. The patient is status post CABG. The LIMA is well opacified. The saphenous vein grafts have questionable patency with no visible opacification.  LUNG WINDOWS:  Subsegmental bibasilar atelectasis. No consolidation. No effusion. No suspicious pulmonary nodule.  UPPER ABDOMEN:  Dense liver, correlate with patient's amiodarone use. Cholelithiasis without acute cholecystitis.  OSSEOUS:  Remote and healed posterior right rib fractures.  Review of the MIP images confirms the above findings.  IMPRESSION: 1. No acute aortic finding or central pulmonary embolism. 2. Extensive atherosclerosis, status post CABG. The LIMA is well opacified; doubtful patency of the saphenous vein grafts. 3. Bibasilar atelectasis. 4. Cholelithiasis   Electronically Signed   By: Monte Fantasia M.D.   On: 07/14/2014  23:52    EKG:   Orders placed or performed in visit on 12/25/13  . EKG 12-Lead  Normal sinus rhythm with ventricular rate of 78 bpm, first-degree AV block, right bundle branch block.  IMPRESSION AND PLAN:   1. Chest pain following fall at home, atypical. Rule out acute coronary event with history of coronary artery disease. EKG no acute ischemic changes, troponin 1 WNL. Plan:: Admit to telemetry, continue aspirin and nitroglycerin, beta blocker. No statin because of allergy. Cycle cardiac enzymes, order echo cardiogram and cardiac consultation for further advice. 2. Status post fall secondary to imbalance with superficial injury to head. History of chronic imbalance +. CT of the head and C-spine negative for acute injury/bleed. Plan: Monitor, neuro watch. 3. Nausea and vomiting with associated dizziness. Etiology? Vertigo. Plan: IV Zofran. 4. History of coronary artery disease, status post CABG. Plan: As above. 5. History of systolic congestive heart failure, compensated on medical management. Monitor 6. Rheumatoid arthritis, stable on oral prednisone. 7. History of V. tach status post AICD, stable. Telemetry monitoring. 8. Gait abnormality with balance problems, chronic. Plan: PT/OT consultation.    All the records are reviewed and case discussed with ED provider. Management plans discussed with the patient, family and they are in agreement.  CODE STATUS: Full code  TOTAL TIME TAKING CARE OF THIS PATIENT: 50 minutes.    Azucena Freed N M.D on 07/15/2014 at 1:43 AM  Between 7am to 6pm - Pager - [phone]  After 6pm go to [email or web address] - password [email or web address] [facility]  Office  [phone]  CC: Primary care physician; [name], MD

## 2014-07-15 NOTE — Progress Notes (Signed)
Pt was c/o nausea and chest pain with resulted troponin just in at 0.25, Dr. Margaretmary Eddy to the bedside, notified of troponin and s/s.  stat EKG ordered and Dr. Ubaldo Glassing was paged for cardiology consult.  Order received to transfer pt to CCU.  Pastoral Care took patients son to the CCU waiting room.

## 2014-07-15 NOTE — Progress Notes (Signed)
Patient is alert and oriented. Transferred from 2A today. Reporting musculoskeletal soreness to chest and shoulders improved with PRN. Administered PRN for nausea, no emesis at this time. To CT today. Resting quietly between care. Voiding in urinal, up to [facility] for small BM. Will continue to monitor.

## 2014-07-15 NOTE — Consult Note (Addendum)
[facility]  CARDIOLOGY CONSULT NOTE  Patient ID: [name] MRN: [id number] DOB/AGE: 08-12-1940 74 y.o.  Admit date: 07/14/2014 Referring Physician gouru Primary Physician conroy Primary Cardiologist Dr. Lauretta Chester Patel,Dr. Micheline Maze Reason for Consultation Fall with acute chest pain and t wave inversion in lateral leads.   HPI:  Pt is a 74 yo male with history of cad s/p cabg in 1992 with lima to lad, svg to rca and 2 other locations with cath in 2104 with nstemi revealing patent lima to lad, occluded svg to rca and other vessels. Underwent pci of lm/lcx with two des in 2014. Had ef of 40% prior to this but ef was 15-20% post pci and nstemi and required iabp temporarily. Underwent placment of aicd with a [place] device. He has been treated for chronic systollic chf class 2-3 with afterload reduction with losartan, carvedilol, spironolactone. He is on amiodarone at 400 mg daily after a documented aicd shock. He apparently has been at his baseline. Last pm, was found on the floor in his bath room with a contusion on the back of his head. He is not aware of what happened. Head ct revaled no fracture or intrcraianal hemorhage or abnormality. EKG on presentation was at his baseline with sr with rbbb and t wave inversion laterally. He developed bilateral chest and sternal pain with radiation to his jaw. EKG revealed evidence of st depression in the lateral leads. This was new from admission ekg. Troponin on admission was 0.25 with normal ck/mb. Potassium was somewhat elevated.He is being transferred to icu and placed on iv heparin, iv nitrates. Hemodynamically stable at present. SOmewhat difficult historian  EF 20% with  Left Heart Cardiac Catheterization  Results:  Coronary arteries:  Left main: 80%  Left anterior descending: 100% (visualized via LIMA)  Left circumflex: 80% prox; 60% past OM2 (distal LCx)  Right coronary: 100%  Grafts:  LIMA to the LAD:  patent (10% mid)  SVG to RCA: 100%  SVG to ? (x2) : 100%  Impressions and Recommendations:  3-vessel coronary artery disease  PCI  Percutaneous coronary intervention performed of the LM & LCx lesions.  Guide catheter: XB3.5 guide w/ SH  Lesion #1. 80% midLCx  Devices: 2.0x46m Trek @ 14atm  2.5x132mTrek @ 14atm  2.25x1235mesolute (DES#1) @ 16atm  2.5x8mm1m Trek  Lesion #2. 80%  Devices: 3.0x15mm39molute (DES#1) @ 16atm  3.5x12 [place] [employer] post-dilation @ 14 atm    Automatic implantable cardioverter-defibrillator in situ Implanted 07/26/2012 for secondary prevention of VT/VF arrest: 1. [place] [id number] Lead: BS Model 0181, S/N [id number] Review of Systems - History obtained from chart review and unobtainable from patient due to lack of cooperation General ROS: positive for  - fatigue Respiratory ROS: positive for - shortness of breath Cardiovascular ROS: positive for - chest pain and shortness of breath Gastrointestinal ROS: positive for - abdominal pain Musculoskeletal ROS: positive for - muscle pain Neurological ROS: no TIA or stroke symptoms   Past Medical History  Diagnosis Date  . Leg weakness   . Balance problem   . Arthritis   . Sinus headache   . High cholesterol   . Collagen vascular disease     RA since 2014  . CHF (congestive heart failure)   . Coronary artery disease   . Myocardial infarction   . AICD (automatic cardioverter/defibrillator) present   . Dysrhythmia     Family  History  Problem Relation Age of Onset  . Alzheimer's disease Mother     History   Social History  . Marital Status: Married    Spouse Name: [name]  . Number of Children: 3  . Years of Education: 12   Occupational History  .      Retired   Social History Main Topics  . Smoking status: Never Smoker   . Smokeless tobacco: Never Used  . Alcohol Use: No  . Drug Use: No  . Sexual Activity: Not on file   Other Topics Concern  . Not on  file   Social History Narrative   Patient lives at home with his wife [name]).   Retired.   Education 12th grade.   Right handed.   Caffeine one cup daily.    Past Surgical History  Procedure Laterality Date  . Cardiac surgery    . Stents    . Heart pump    . Psychologist, forensic    . Coronary artery bypass graft    . Coronary angioplasty    . Insert / replace / remove pacemaker       Prescriptions prior to admission  Medication Sig Dispense Refill Last Dose  . Acetaminophen (TYLENOL PO) Take by mouth as needed.   Taking  . amiodarone (PACERONE) 200 MG tablet Take 400 mg by mouth daily.    Taking  . aspirin EC 81 MG tablet Take 81 mg by mouth daily.   Taking  . carvedilol (COREG) 3.125 MG tablet Take 3.125 mg by mouth 2 (two) times daily with a meal.   Taking  . cetirizine (ZYRTEC) 10 MG tablet Take 10 mg by mouth daily.   Taking  . Cholecalciferol (VITAMIN D3) 2000 UNITS TABS Take by mouth daily.   Taking  . Docusate Sodium (COLACE PO) Take by mouth daily.   Taking  . fluticasone (FLONASE) 50 MCG/ACT nasal spray Place into both nostrils daily.   Taking  . furosemide (LASIX) 40 MG tablet Take 40 mg by mouth as needed.    Taking  . losartan (COZAAR) 50 MG tablet Take 50 mg by mouth daily.   Taking  . nitroGLYCERIN (NITROSTAT) 0.4 MG SL tablet Place 0.4 mg under the tongue every 5 (five) minutes as needed for chest pain.   Taking  . spironolactone (ALDACTONE) 25 MG tablet Take 25 mg by mouth daily.   Taking    Physical Exam: Blood pressure 112/62, pulse 84, temperature 97.6 F (36.4 C), temperature source Oral, resp. rate 18, height 6' (1.829 m), weight 89.177 kg (196 lb 9.6 oz), SpO2 100 %.   General appearance: moderate distress Head: Normocephalic, without obvious abnormality, scalp contusion Neck: no adenopathy, no carotid bruit, no JVD, supple, symmetrical, trachea midline and thyroid not enlarged, symmetric, no tenderness/mass/nodules Resp: rhonchi bibasilar Chest wall: right  sided chest wall tenderness, left sided chest wall tenderness Cardio: prominent apical impulse and regular rate and rhythm GI: soft, non-tender; bowel sounds normal; no masses,  no organomegaly Extremities: extremities normal, atraumatic, no cyanosis or edema Neurologic: Mental status: Alert, oriented, thought content appropriate, affect: normal Labs:   Lab Results  Component Value Date   WBC 10.3 07/15/2014   HGB 12.9* 07/15/2014   HCT 40.2 07/15/2014   MCV 91.9 07/15/2014   PLT 186 07/15/2014    Recent Labs Lab 07/15/14 0644  NA 135  K 5.8*  CL 104  CO2 21*  BUN 23*  CREATININE 0.95  CALCIUM 8.3*  PROT 6.7  BILITOT 1.0  ALKPHOS 62  ALT 122*  AST 132*  GLUCOSE 129*   Lab Results  Component Value Date   TROPONINI 0.25* 07/15/2014      Radiology: NO PE on chest ct. Head ct reveale no evidence of fracture or acute inarction , mass lesion or bleed. CXR revealed no evidence of active disease.  EKG: nsr with rbbb and st t wave depresison in lateral leads.   ASSESSMENT AND PLAN:    CAD-s/p cabg in 1992 with lima to lad, svg to rca and probable om/diagonals. Cath in 2014 after nstemi revealed occluded svg to rca and other vessels. Patent lima. Received lm/left circ des with good result. EF dropped from 40 to 15-20% at that time and he was treated since with afterload reduction, carvedilol and spironolactone and diuresis. Had aicd placed for primary prevention. Was place on amiodarone 200 but had an episode of vt treated with a shock and his amiodarone was increased to 400 daily. Etiology of ekg changes sugggests possible ischemia,. Will transfer to icu and place on iv ntg, iv heparin and continue carvedilol and rule out for mi. Echo pending to compare to previous. Consideration for further intervention pending course.   Syncope-etiology arrythmia . Interogation of his aicd reveals that he had an episode of VF last pm which was appropriatly treated with atp back to nsr. Will  lincrease amiodarone to 400 bid. Will follow for further arrythmia. Will rule out for mi as possible etiology of further arryhtmia but pt had an episode of vt/vf previiously when on a lower dose of amiodarone. Will discuss with his team at [facility] if possible.   chf-cxr did not suggest chf on admission and exam stable at present. WIll continue with losartan as pressure tolerates an spironolactone as potassium and pressure tolerates. Diuresis as needed    Signed: [name] MD, [facility] 07/15/2014, 8:46 AM

## 2014-07-15 NOTE — Progress Notes (Signed)
ANTICOAGULATION CONSULT NOTE - Initial Consult  Pharmacy Consult for Heparin Drip Indication: chest pain/ACS  Allergies  Allergen Reactions  . Nitroglycerin Other (See Comments)    "head ache, worse than a migraine"  . Pravastatin Other (See Comments)    Patient Measurements: Height: 6' (182.9 cm) Weight: 196 lb 9.6 oz (89.177 kg) IBW/kg (Calculated) : 77.6 Heparin Dosing Weight: 89.2 kg  Vital Signs: Temp: 98.4 F (36.9 C) (07/09 1600) Temp Source: Oral (07/09 1600) BP: 105/55 mmHg (07/09 1800) Pulse Rate: 76 (07/09 1800)  Labs:  Recent Labs  07/14/14 2140 07/15/14 0644 07/15/14 0922 07/15/14 1732  HGB 12.8* 12.9*  --   --   HCT 38.9* 40.2  --   --   PLT 203 186  --   --   APTT 23*  --   --   --   LABPROT 14.3  --   --   --   INR 1.09  --   --   --   HEPARINUNFRC  --   --   --  0.53  CREATININE 1.34* 0.95 1.02  --   TROPONINI 0.03 0.25* 0.32* 0.55*    Estimated Creatinine Clearance: 69.7 mL/min (by C-G formula based on Cr of 1.02).   Medical History: Past Medical History  Diagnosis Date  . Leg weakness   . Balance problem   . Arthritis   . Sinus headache   . High cholesterol   . Collagen vascular disease     RA since 2014  . CHF (congestive heart failure)   . Coronary artery disease   . Myocardial infarction   . AICD (automatic cardioverter/defibrillator) present   . Dysrhythmia     Medications:  Scheduled:  . amiodarone  400 mg Oral BID  . aspirin EC  81 mg Oral Daily  . carvedilol  3.125 mg Oral BID WC  . cholecalciferol  2,000 Units Oral Daily  . docusate sodium  100 mg Oral Daily  . fluticasone  1 spray Each Nare Daily  . furosemide  40 mg Oral Daily  . loratadine  10 mg Oral Daily  . losartan  50 mg Oral Daily  . meclizine  25 mg Oral TID  . promethazine  12.5 mg Intravenous Once  . sodium chloride  3 mL Intravenous Q12H  . spironolactone  25 mg Oral Daily   Infusions:  . heparin 1,150 Units/hr (07/15/14 1800)     Assessment: Patient is a 74 yo male transferred to ICU this AM due to EKG changes suggesting possible ischemia. Nitroglycerin drip initiated per cardiology.  Pharmacy consulted to initiate and titrate heparin drip. Heparin drip infusing at 1150 units/hr.  7/9 17:30 HL 0.53    Goal of Therapy:  Heparin level 0.3-0.7 units/ml Monitor platelets by anticoagulation protocol: Yes   Plan:  Will continue with current rate of 1150 units/hr   Re-Check HL in 8 hours to verify still in range. Will order CBC in AM.   Paulina Fusi, PharmD, BCPS 07/15/2014 7:07 PM

## 2014-07-15 NOTE — Consult Note (Signed)
CC: dizziness   HPI: [name] is an 74 y.o. male was brought in by EMS to the ED following a fall at home in his bathroom secondary to imbalance and chronic gait disturbances. Patient states that while he went to the bathroom he fell down because of gait disturbances and did not remember anything further. His wife went and found the patient on the floor, was disoriented for a few minutes but later was coronary event. Mental status now improved.  But pt does complain of positional dizziness when changing position and turning his head.    Past Medical History  Diagnosis Date  . Leg weakness   . Balance problem   . Arthritis   . Sinus headache   . High cholesterol   . Collagen vascular disease     RA since 2014  . CHF (congestive heart failure)   . Coronary artery disease   . Myocardial infarction   . AICD (automatic cardioverter/defibrillator) present   . Dysrhythmia     Past Surgical History  Procedure Laterality Date  . Cardiac surgery    . Stents    . Heart pump    . Psychologist, forensic    . Coronary artery bypass graft    . Coronary angioplasty    . Insert / replace / remove pacemaker      Family History  Problem Relation Age of Onset  . Alzheimer's disease Mother     Social History:  reports that he has never smoked. He has never used smokeless tobacco. He reports that he does not drink alcohol or use illicit drugs.  Allergies  Allergen Reactions  . Nitroglycerin Other (See Comments)    "head ache, worse than a migraine"  . Pravastatin Other (See Comments)    Medications: I have reviewed the patient's current medications.  ROS: History obtained from the patient  General ROS: negative for - chills, fatigue, fever, night sweats, weight gain or weight loss Psychological ROS: negative for - behavioral disorder, hallucinations, memory difficulties, mood swings or suicidal ideation Ophthalmic ROS: negative for - blurry vision, double vision, eye pain or loss of  vision ENT ROS: negative for - epistaxis, nasal discharge, oral lesions, sore throat, tinnitus or vertigo Allergy and Immunology ROS: negative for - hives or itchy/watery eyes Hematological and Lymphatic ROS: negative for - bleeding problems, bruising or swollen lymph nodes Endocrine ROS: negative for - galactorrhea, hair pattern changes, polydipsia/polyuria or temperature intolerance Respiratory ROS: negative for - cough, hemoptysis, shortness of breath or wheezing Cardiovascular ROS: negative for - chest pain, dyspnea on exertion, edema or irregular heartbeat Gastrointestinal ROS: negative for - abdominal pain, diarrhea, hematemesis, nausea/vomiting or stool incontinence Genito-Urinary ROS: negative for - dysuria, hematuria, incontinence or urinary frequency/urgency Musculoskeletal ROS: negative for - joint swelling or muscular weakness Neurological ROS: as noted in HPI Dermatological ROS: negative for rash and skin lesion changes  Physical Examination: Blood pressure 104/53, pulse 78, temperature 98.4 F (36.9 C), temperature source Oral, resp. rate 16, height 6' (1.829 m), weight 89.177 kg (196 lb 9.6 oz), SpO2 98 %.   Mental Status: Alert, oriented, thought content appropriate.  Speech fluent without evidence of aphasia.  Able to follow 3 step commands without difficulty. Cranial Nerves: II: Discs flat bilaterally; Visual fields grossly normal, pupils equal, round, reactive to light and accommodation III,IV, VI: ptosis not present, extra-ocular motions intact bilaterally V,VII: smile symmetric, facial light touch sensation normal bilaterally VIII: hearing normal bilaterally IX,X: gag reflex present XI: bilateral shoulder  shrug XII: midline tongue extension Motor: Right : Upper extremity   5/5    Left:     Upper extremity   5/5  Lower extremity   5/5     Lower extremity   5/5 Tone and bulk:normal tone throughout; no atrophy noted Sensory: Pinprick and light touch intact  throughout, bilaterally Deep Tendon Reflexes: 1+ and symmetric throughout Plantars: Right: downgoing   Left: downgoing Cerebellar: normal finger-to-nose, normal rapid alternating movements and normal heel-to-shin test Gait: not tested   Laboratory Studies:   Basic Metabolic Panel:  Recent Labs Lab 07/14/14 2140 07/15/14 0644 07/15/14 0922 07/15/14 1048  NA 136 135 137  --   K 4.3 5.8* 4.5 4.4  CL 100* 104 102  --   CO2 23 21* 25  --   GLUCOSE 167* 129* 121*  --   BUN 29* 23* 22*  --   CREATININE 1.34* 0.95 1.02  --   CALCIUM 8.6* 8.3* 8.2*  --     Liver Function Tests:  Recent Labs Lab 07/14/14 2140 07/15/14 0644 07/15/14 0922  AST 140* 132* 124*  ALT 129* 122* 116*  ALKPHOS 63 62 58  BILITOT 0.6 1.0 0.4  PROT 6.8 6.7 6.3*  ALBUMIN 3.0* 3.0* 2.9*   No results for input(s): LIPASE, AMYLASE in the last 168 hours. No results for input(s): AMMONIA in the last 168 hours.  CBC:  Recent Labs Lab 07/14/14 2140 07/15/14 0644  WBC 11.9* 10.3  HGB 12.8* 12.9*  HCT 38.9* 40.2  MCV 90.5 91.9  PLT 203 186    Cardiac Enzymes:  Recent Labs Lab 07/14/14 2140 07/15/14 0644 07/15/14 0922  TROPONINI 0.03 0.25* 0.32*    BNP: Invalid input(s): POCBNP  CBG:  Recent Labs Lab 07/15/14 0855  GLUCAP 130*    Microbiology: No results found for this or any previous visit.  Coagulation Studies:  Recent Labs  07/14/14 2140  LABPROT 14.3  INR 1.09    Urinalysis: No results for input(s): COLORURINE, LABSPEC, PHURINE, GLUCOSEU, HGBUR, BILIRUBINUR, KETONESUR, PROTEINUR, UROBILINOGEN, NITRITE, LEUKOCYTESUR in the last 168 hours.  Invalid input(s): APPERANCEUR  Lipid Panel:  No results found for: CHOL, TRIG, HDL, CHOLHDL, VLDL, LDLCALC  HgbA1C: No results found for: HGBA1C  Urine Drug Screen:  No results found for: LABOPIA, COCAINSCRNUR, LABBENZ, AMPHETMU, THCU, LABBARB  Alcohol Level: No results for input(s): ETH in the last 168 hours.  Imaging: Ct  Angio Head W/cm &/or Wo Cm  07/15/2014   CLINICAL DATA:  Nausea.  Dizziness and headache.  EXAM: CT ANGIOGRAPHY HEAD AND NECK  TECHNIQUE: Multidetector CT imaging of the head and neck was performed using the standard protocol during bolus administration of intravenous contrast. Multiplanar CT image reconstructions and MIPs were obtained to evaluate the vascular anatomy. Carotid stenosis measurements (when applicable) are obtained utilizing NASCET criteria, using the distal internal carotid diameter as the denominator.  CONTRAST:  197mL OMNIPAQUE IOHEXOL 350 MG/ML SOLN  COMPARISON:  CT head 07/14/2014  FINDINGS: CT HEAD  Brain: Unenhanced head CT performed yesterday was not repeated. Generalized atrophy. Negative for hemorrhage or mass. Mild chronic microvascular ischemic change in the white matter. No acute infarct.  Calvarium and skull base: Negative  Paranasal sinuses: Bony thickening and mucosal edema of the right maxillary sinus secondary to chronic sinusitis. No air-fluid level.  Orbits: Negative  CTA NECK  Aortic arch: Atherosclerotic calcification in the aortic arch and proximal great vessels without significant stenosis. Lung apices are clear.  Right carotid system: Circumferential atherosclerotic  calcification involving the proximal right internal carotid artery narrowing the lumen by approximately 50% diameter stenosis. Note is made of artifact through the bifurcation due to dental amalgam. No dissection. Right external carotid artery widely patent  Left carotid system: Left common carotid artery widely patent. Atherosclerotic calcification the carotid bulb with 25% diameter stenosis the lumen. Artifact through the carotid bulb due to dental amalgam. External carotid artery patent  Vertebral arteries:Calcific stenosis of the origin of the vertebral artery bilaterally. Circumferential atherosclerotic calcification and moderate stenosis of the distal vertebral artery bilaterally. Remainder of the vertebral  arteries are widely patent.  Skeleton: Mild cervical degenerative change. No acute bony abnormality.  Other neck: Negative for mass or adenopathy in the neck.  CTA HEAD  Anterior circulation: Atherosclerotic calcification throughout the right cavernous carotid artery causing moderate stenosis. Right anterior and middle cerebral arteries are patent without stenosis  Atherosclerotic calcification throughout the left cavernous carotid with moderate stenosis. Left anterior and middle cerebral arteries are patent bilaterally without stenosis.  Posterior circulation: Moderate stenosis distal vertebral artery bilaterally due to calcific stenosis. PICA patent bilaterally. Basilar is patent. Superior cerebellar and posterior cerebral arteries are patent without significant stenosis.  Venous sinuses: Patent  Anatomic variants: Negative for cerebral aneurysm.  Delayed phase: No enhancing lesions postcontrast administration  IMPRESSION: 50% diameter stenosis due to calcific plaque proximal right internal carotid artery.  25% stenosis due to calcific plaque proximal left internal carotid artery.  Moderate stenosis proximal and distal vertebral artery bilaterally due to calcified stenosis  Moderate stenosis cavernous carotid bilaterally due to calcified stenosis.   Electronically Signed   By: Franchot Gallo M.D.   On: 07/15/2014 17:56   Ct Head Wo Contrast  07/14/2014   CLINICAL DATA:  Status post fall in bathroom, with vomiting. Patient does not remember fall. Concern for cervical spine injury. Initial encounter.  EXAM: CT HEAD WITHOUT CONTRAST  CT CERVICAL SPINE WITHOUT CONTRAST  TECHNIQUE: Multidetector CT imaging of the head and cervical spine was performed following the standard protocol without intravenous contrast. Multiplanar CT image reconstructions of the cervical spine were also generated.  COMPARISON:  CT of the cervical spine performed 01/10/2014  FINDINGS: CT HEAD FINDINGS  There is no evidence of acute  infarction, mass lesion, or intra- or extra-axial hemorrhage on CT.  Prominence of the ventricles and sulci reflects mild cortical volume loss. Mild cerebellar atrophy is noted. Scattered periventricular white matter change likely reflects small vessel ischemic microangiopathy.  The brainstem and fourth ventricle are within normal limits. The basal ganglia are unremarkable in appearance. The cerebral hemispheres demonstrate grossly normal gray-white differentiation. No mass effect or midline shift is seen.  There is no evidence of fracture; visualized osseous structures are unremarkable in appearance. The visualized portions of the orbits are within normal limits. There is minimal partial opacification of the right mastoid air cells. Mild inspissated mucus is noted at the base of the right maxillary sinus. The remaining paranasal sinuses and left mastoid air cells are well-aerated. No significant soft tissue abnormalities are seen.  CT CERVICAL SPINE FINDINGS  There is no evidence of fracture or subluxation. Vertebral bodies demonstrate normal height and alignment. Mild disc space narrowing is noted at C4-C5 and at C6-C7, with small anterior and posterior disc osteophyte complexes. Prevertebral soft tissues are within normal limits.  The thyroid gland is unremarkable in appearance. The visualized lung apices are clear. Calcification is noted about the carotid bifurcations bilaterally.  IMPRESSION: 1. No evidence of traumatic intracranial injury  or fracture. 2. No evidence of fracture or subluxation along the cervical spine. 3. Mild cortical volume loss and scattered small vessel ischemic microangiopathy. 4. Minimal partial opacification of the right mastoid air cells, and mild inspissated mucus at the base of the right maxillary sinus. 5. Calcification about the carotid bifurcations bilaterally. Carotid ultrasound would be helpful for further evaluation, when and as deemed clinically appropriate.   Electronically  Signed   By: Garald Balding M.D.   On: 07/14/2014 23:41   Ct Angio Neck W/cm &/or Wo/cm  07/15/2014   CLINICAL DATA:  Nausea.  Dizziness and headache.  EXAM: CT ANGIOGRAPHY HEAD AND NECK  TECHNIQUE: Multidetector CT imaging of the head and neck was performed using the standard protocol during bolus administration of intravenous contrast. Multiplanar CT image reconstructions and MIPs were obtained to evaluate the vascular anatomy. Carotid stenosis measurements (when applicable) are obtained utilizing NASCET criteria, using the distal internal carotid diameter as the denominator.  CONTRAST:  156mL OMNIPAQUE IOHEXOL 350 MG/ML SOLN  COMPARISON:  CT head 07/14/2014  FINDINGS: CT HEAD  Brain: Unenhanced head CT performed yesterday was not repeated. Generalized atrophy. Negative for hemorrhage or mass. Mild chronic microvascular ischemic change in the white matter. No acute infarct.  Calvarium and skull base: Negative  Paranasal sinuses: Bony thickening and mucosal edema of the right maxillary sinus secondary to chronic sinusitis. No air-fluid level.  Orbits: Negative  CTA NECK  Aortic arch: Atherosclerotic calcification in the aortic arch and proximal great vessels without significant stenosis. Lung apices are clear.  Right carotid system: Circumferential atherosclerotic calcification involving the proximal right internal carotid artery narrowing the lumen by approximately 50% diameter stenosis. Note is made of artifact through the bifurcation due to dental amalgam. No dissection. Right external carotid artery widely patent  Left carotid system: Left common carotid artery widely patent. Atherosclerotic calcification the carotid bulb with 25% diameter stenosis the lumen. Artifact through the carotid bulb due to dental amalgam. External carotid artery patent  Vertebral arteries:Calcific stenosis of the origin of the vertebral artery bilaterally. Circumferential atherosclerotic calcification and moderate stenosis of the  distal vertebral artery bilaterally. Remainder of the vertebral arteries are widely patent.  Skeleton: Mild cervical degenerative change. No acute bony abnormality.  Other neck: Negative for mass or adenopathy in the neck.  CTA HEAD  Anterior circulation: Atherosclerotic calcification throughout the right cavernous carotid artery causing moderate stenosis. Right anterior and middle cerebral arteries are patent without stenosis  Atherosclerotic calcification throughout the left cavernous carotid with moderate stenosis. Left anterior and middle cerebral arteries are patent bilaterally without stenosis.  Posterior circulation: Moderate stenosis distal vertebral artery bilaterally due to calcific stenosis. PICA patent bilaterally. Basilar is patent. Superior cerebellar and posterior cerebral arteries are patent without significant stenosis.  Venous sinuses: Patent  Anatomic variants: Negative for cerebral aneurysm.  Delayed phase: No enhancing lesions postcontrast administration  IMPRESSION: 50% diameter stenosis due to calcific plaque proximal right internal carotid artery.  25% stenosis due to calcific plaque proximal left internal carotid artery.  Moderate stenosis proximal and distal vertebral artery bilaterally due to calcified stenosis  Moderate stenosis cavernous carotid bilaterally due to calcified stenosis.   Electronically Signed   By: Franchot Gallo M.D.   On: 07/15/2014 17:56   Ct Cervical Spine Wo Contrast  07/14/2014   CLINICAL DATA:  Status post fall in bathroom, with vomiting. Patient does not remember fall. Concern for cervical spine injury. Initial encounter.  EXAM: CT HEAD WITHOUT CONTRAST  CT CERVICAL  SPINE WITHOUT CONTRAST  TECHNIQUE: Multidetector CT imaging of the head and cervical spine was performed following the standard protocol without intravenous contrast. Multiplanar CT image reconstructions of the cervical spine were also generated.  COMPARISON:  CT of the cervical spine performed  01/10/2014  FINDINGS: CT HEAD FINDINGS  There is no evidence of acute infarction, mass lesion, or intra- or extra-axial hemorrhage on CT.  Prominence of the ventricles and sulci reflects mild cortical volume loss. Mild cerebellar atrophy is noted. Scattered periventricular white matter change likely reflects small vessel ischemic microangiopathy.  The brainstem and fourth ventricle are within normal limits. The basal ganglia are unremarkable in appearance. The cerebral hemispheres demonstrate grossly normal gray-white differentiation. No mass effect or midline shift is seen.  There is no evidence of fracture; visualized osseous structures are unremarkable in appearance. The visualized portions of the orbits are within normal limits. There is minimal partial opacification of the right mastoid air cells. Mild inspissated mucus is noted at the base of the right maxillary sinus. The remaining paranasal sinuses and left mastoid air cells are well-aerated. No significant soft tissue abnormalities are seen.  CT CERVICAL SPINE FINDINGS  There is no evidence of fracture or subluxation. Vertebral bodies demonstrate normal height and alignment. Mild disc space narrowing is noted at C4-C5 and at C6-C7, with small anterior and posterior disc osteophyte complexes. Prevertebral soft tissues are within normal limits.  The thyroid gland is unremarkable in appearance. The visualized lung apices are clear. Calcification is noted about the carotid bifurcations bilaterally.  IMPRESSION: 1. No evidence of traumatic intracranial injury or fracture. 2. No evidence of fracture or subluxation along the cervical spine. 3. Mild cortical volume loss and scattered small vessel ischemic microangiopathy. 4. Minimal partial opacification of the right mastoid air cells, and mild inspissated mucus at the base of the right maxillary sinus. 5. Calcification about the carotid bifurcations bilaterally. Carotid ultrasound would be helpful for further  evaluation, when and as deemed clinically appropriate.   Electronically Signed   By: Garald Balding M.D.   On: 07/14/2014 23:41   Dg Chest Portable 1 View  07/14/2014   CLINICAL DATA:  Chest pain  EXAM: PORTABLE CHEST - 1 VIEW  COMPARISON:  05/30/2014  FINDINGS: Stable positioning of single chamber pacer/ ICD into the right ventricle. Stable heart size and mediastinal contours post CABG. There is no edema, consolidation, effusion, or pneumothorax. Remote and healed posterior right rib fractures.  IMPRESSION: No evidence of active disease   Electronically Signed   By: Monte Fantasia M.D.   On: 07/14/2014 21:53   Ct Angio Chest Aorta W/cm &/or Wo/cm  07/14/2014   CLINICAL DATA:  Severe chest pain  EXAM: CT ANGIOGRAPHY CHEST WITH CONTRAST  TECHNIQUE: Multidetector CT imaging of the chest was performed using the standard protocol during bolus administration of intravenous contrast. Multiplanar CT image reconstructions and MIPs were obtained to evaluate the vascular anatomy.  CONTRAST:  161mL OMNIPAQUE IOHEXOL 350 MG/ML SOLN  COMPARISON:  None.  FINDINGS: THORACIC INLET/BODY WALL:  Single chamber ICD/pacer from the left is in unremarkable position, tip at the right ventricular apex.  MEDIASTINUM:  Normal heart size. No pericardial effusion. No acute vascular abnormality, including aortic dissection or intramural hematoma. No central pulmonary embolism. Extensive atherosclerosis, including the coronary arteries. The patient is status post CABG. The LIMA is well opacified. The saphenous vein grafts have questionable patency with no visible opacification.  LUNG WINDOWS:  Subsegmental bibasilar atelectasis. No consolidation. No effusion. No suspicious  pulmonary nodule.  UPPER ABDOMEN:  Dense liver, correlate with patient's amiodarone use. Cholelithiasis without acute cholecystitis.  OSSEOUS:  Remote and healed posterior right rib fractures.  Review of the MIP images confirms the above findings.  IMPRESSION: 1. No acute  aortic finding or central pulmonary embolism. 2. Extensive atherosclerosis, status post CABG. The LIMA is well opacified; doubtful patency of the saphenous vein grafts. 3. Bibasilar atelectasis. 4. Cholelithiasis   Electronically Signed   By: Monte Fantasia M.D.   On: 07/14/2014 23:52     Assessment/Plan: 74 y.o. male was brought in by EMS to the ED following a fall at home in his bathroom secondary to imbalance and chronic gait disturbances. Patient states that while he went to the bathroom he fell down because of gait disturbances and did not remember anything further. His wife went and found the patient on the floor, was disoriented for a few minutes but later was coronary event. Mental status now improved.  But pt does complain of positional dizziness when changing position and turning his head.    - CTA head done does not show any significant intracranial stenosis to warranty Vertebrobasilar insufficiency.  - Dizziness is positional likely BPPV. Would try meclizine/valium PRN. No further imaging from neuro stand point - do not think pt needs to be in ICU. [name]    07/15/2014, 6:11 PM

## 2014-07-15 NOTE — Progress Notes (Signed)
PT Cancellation Note  Patient Details Name: [name] MRN: [id number] DOB: 05-10-1940   Cancelled Treatment:    Reason Eval/Treat Not Completed: Other (comment) (Spoke with RN, pt not appropriate for PT today due to medical status and dizziness.  Will hold until tomorrow.)   Mellie Buccellato A Christpher Stogsdill 07/15/2014, 3:08 PM

## 2014-07-15 NOTE — Progress Notes (Signed)
ANTICOAGULATION CONSULT NOTE - Initial Consult  Pharmacy Consult for Heparin Drip Indication: chest pain/ACS  Allergies  Allergen Reactions  . Nitroglycerin Other (See Comments)    "head ache, worse than a migraine"  . Pravastatin Other (See Comments)    Patient Measurements: Height: 6' (182.9 cm) Weight: 196 lb 9.6 oz (89.177 kg) IBW/kg (Calculated) : 77.6 Heparin Dosing Weight: 89.2 kg  Vital Signs: Temp: 98.2 F (36.8 C) (07/09 0900) Temp Source: Oral (07/09 0900) BP: 128/71 mmHg (07/09 0900) Pulse Rate: 80 (07/09 0900)  Labs:  Recent Labs  07/14/14 2140 07/15/14 0644 07/15/14 0922  HGB 12.8* 12.9*  --   HCT 38.9* 40.2  --   PLT 203 186  --   APTT 23*  --   --   LABPROT 14.3  --   --   INR 1.09  --   --   CREATININE 1.34* 0.95 1.02  TROPONINI 0.03 0.25* 0.32*    Estimated Creatinine Clearance: 69.7 mL/min (by C-G formula based on Cr of 1.02).   Medical History: Past Medical History  Diagnosis Date  . Leg weakness   . Balance problem   . Arthritis   . Sinus headache   . High cholesterol   . Collagen vascular disease     RA since 2014  . CHF (congestive heart failure)   . Coronary artery disease   . Myocardial infarction   . AICD (automatic cardioverter/defibrillator) present   . Dysrhythmia     Medications:  Scheduled:  . amiodarone  400 mg Oral Daily  . aspirin EC  81 mg Oral Daily  . carvedilol  3.125 mg Oral BID WC  . cholecalciferol  2,000 Units Oral Daily  . docusate sodium  100 mg Oral Daily  . fluticasone  1 spray Each Nare Daily  . furosemide  40 mg Oral Daily  . loratadine  10 mg Oral Daily  . losartan  50 mg Oral Daily  . meclizine  25 mg Oral TID  . promethazine  12.5 mg Intravenous Once  . sodium chloride  3 mL Intravenous Q12H  . sodium polystyrene  30 g Oral STAT  . spironolactone  25 mg Oral Daily   Infusions:  . sodium chloride 50 mL/hr at 07/15/14 0914  . heparin 1,150 Units/hr (07/15/14 0914)  . nitroGLYCERIN       Assessment: Patient is a 74 yo male transferred to ICU this AM due to EKG changes suggesting possible ischemia. Nitroglycerin drip initiated per cardiology.  Pharmacy consulted to initiate and titrate heparin drip.    Goal of Therapy:  Heparin level 0.3-0.7 units/ml Monitor platelets by anticoagulation protocol: Yes   Plan:  Give 4000 units bolus x 1  Will initiate heparin drip at rate of 1150 units/hr.   Check HL 8 hours after drip initiation at 1730 today to assess for drip rate change. Will order CBC in AM.   Murrell Converse, PharmD Clinical Pharmacist 07/15/2014

## 2014-07-15 NOTE — Progress Notes (Signed)
[facility] [place] NAME: [name]    MR#:  [id number]  DATE OF BIRTH:  01-11-1940  SUBJECTIVE:  CHIEF COMPLAINT:  Patient was reporting diffuse chest pain this a.m. Stat EKG has revealed new changes  REVIEW OF SYSTEMS:  CONSTITUTIONAL: No fever, fatigue or weakness.  EYES: No blurred or double vision.  EARS, NOSE, AND THROAT: No tinnitus or ear pain.  RESPIRATORY: No cough, shortness of breath, wheezing or hemoptysis.  CARDIOVASCULAR: Reporting diffuse anterior chest  pain, denies orthopnea, edema.  GASTROINTESTINAL: No nausea, vomiting, diarrhea or abdominal pain.  GENITOURINARY: No dysuria, hematuria.  ENDOCRINE: No polyuria, nocturia,  HEMATOLOGY: No anemia, easy bruising or bleeding SKIN: No rash or lesion. MUSCULOSKELETAL: No joint pain or arthritis.   NEUROLOGIC: No tingling, numbness, weakness.  PSYCHIATRY: No anxiety or depression.   DRUG ALLERGIES:   Allergies  Allergen Reactions  . Nitroglycerin Other (See Comments)    "head ache, worse than a migraine"  . Pravastatin Other (See Comments)    VITALS:  Blood pressure 111/64, pulse 76, temperature 98.2 F (36.8 C), temperature source Oral, resp. rate 18, height 6' (1.829 m), weight 89.177 kg (196 lb 9.6 oz), SpO2 99 %.  PHYSICAL EXAMINATION:  GENERAL:  74 y.o.-year-old patient lying in the bed with no acute distress.  EYES: Pupils equal, round, reactive to light and accommodation. No scleral icterus. Extraocular muscles intact.  HEENT: Head atraumatic, normocephalic. Oropharynx and nasopharynx clear.  NECK:  Supple, no jugular venous distention. No thyroid enlargement, no tenderness.  LUNGS: Normal breath sounds bilaterally, no wheezing, rales,rhonchi or crepitation. No use of accessory muscles of respiration.  CARDIOVASCULAR: S1, S2 normal. No murmurs, rubs, or gallops. Reproducible anterior chest wall tenderness ABDOMEN: Soft, nontender, nondistended.  Bowel sounds present. No organomegaly or mass.  EXTREMITIES: No pedal edema, cyanosis, or clubbing.  NEUROLOGIC: Cranial nerves II through XII are intact. Muscle strength 5/5 in all extremities. Sensation intact. Gait not checked.  PSYCHIATRIC: The patient is alert and oriented x 3.  SKIN: No obvious rash, lesion, or ulcer.    LABORATORY PANEL:   CBC  Recent Labs Lab 07/15/14 0644  WBC 10.3  HGB 12.9*  HCT 40.2  PLT 186   ------------------------------------------------------------------------------------------------------------------  Chemistries   Recent Labs Lab 07/15/14 0922 07/15/14 1048  NA 137  --   K 4.5 4.4  CL 102  --   CO2 25  --   GLUCOSE 121*  --   BUN 22*  --   CREATININE 1.02  --   CALCIUM 8.2*  --   AST 124*  --   ALT 116*  --   ALKPHOS 58  --   BILITOT 0.4  --    ------------------------------------------------------------------------------------------------------------------  Cardiac Enzymes  Recent Labs Lab 07/15/14 0922  TROPONINI 0.32*   ------------------------------------------------------------------------------------------------------------------  RADIOLOGY:  Ct Head Wo Contrast  07/14/2014   CLINICAL DATA:  Status post fall in bathroom, with vomiting. Patient does not remember fall. Concern for cervical spine injury. Initial encounter.  EXAM: CT HEAD WITHOUT CONTRAST  CT CERVICAL SPINE WITHOUT CONTRAST  TECHNIQUE: Multidetector CT imaging of the head and cervical spine was performed following the standard protocol without intravenous contrast. Multiplanar CT image reconstructions of the cervical spine were also generated.  COMPARISON:  CT of the cervical spine performed 01/10/2014  FINDINGS: CT HEAD FINDINGS  There is no evidence of acute infarction, mass lesion, or intra- or extra-axial hemorrhage on CT.  Prominence of the ventricles  and sulci reflects mild cortical volume loss. Mild cerebellar atrophy is noted. Scattered periventricular  white matter change likely reflects small vessel ischemic microangiopathy.  The brainstem and fourth ventricle are within normal limits. The basal ganglia are unremarkable in appearance. The cerebral hemispheres demonstrate grossly normal gray-white differentiation. No mass effect or midline shift is seen.  There is no evidence of fracture; visualized osseous structures are unremarkable in appearance. The visualized portions of the orbits are within normal limits. There is minimal partial opacification of the right mastoid air cells. Mild inspissated mucus is noted at the base of the right maxillary sinus. The remaining paranasal sinuses and left mastoid air cells are well-aerated. No significant soft tissue abnormalities are seen.  CT CERVICAL SPINE FINDINGS  There is no evidence of fracture or subluxation. Vertebral bodies demonstrate normal height and alignment. Mild disc space narrowing is noted at C4-C5 and at C6-C7, with small anterior and posterior disc osteophyte complexes. Prevertebral soft tissues are within normal limits.  The thyroid gland is unremarkable in appearance. The visualized lung apices are clear. Calcification is noted about the carotid bifurcations bilaterally.  IMPRESSION: 1. No evidence of traumatic intracranial injury or fracture. 2. No evidence of fracture or subluxation along the cervical spine. 3. Mild cortical volume loss and scattered small vessel ischemic microangiopathy. 4. Minimal partial opacification of the right mastoid air cells, and mild inspissated mucus at the base of the right maxillary sinus. 5. Calcification about the carotid bifurcations bilaterally. Carotid ultrasound would be helpful for further evaluation, when and as deemed clinically appropriate.   Electronically Signed   By: Garald Balding M.D.   On: 07/14/2014 23:41   Ct Cervical Spine Wo Contrast  07/14/2014   CLINICAL DATA:  Status post fall in bathroom, with vomiting. Patient does not remember fall. Concern  for cervical spine injury. Initial encounter.  EXAM: CT HEAD WITHOUT CONTRAST  CT CERVICAL SPINE WITHOUT CONTRAST  TECHNIQUE: Multidetector CT imaging of the head and cervical spine was performed following the standard protocol without intravenous contrast. Multiplanar CT image reconstructions of the cervical spine were also generated.  COMPARISON:  CT of the cervical spine performed 01/10/2014  FINDINGS: CT HEAD FINDINGS  There is no evidence of acute infarction, mass lesion, or intra- or extra-axial hemorrhage on CT.  Prominence of the ventricles and sulci reflects mild cortical volume loss. Mild cerebellar atrophy is noted. Scattered periventricular white matter change likely reflects small vessel ischemic microangiopathy.  The brainstem and fourth ventricle are within normal limits. The basal ganglia are unremarkable in appearance. The cerebral hemispheres demonstrate grossly normal gray-white differentiation. No mass effect or midline shift is seen.  There is no evidence of fracture; visualized osseous structures are unremarkable in appearance. The visualized portions of the orbits are within normal limits. There is minimal partial opacification of the right mastoid air cells. Mild inspissated mucus is noted at the base of the right maxillary sinus. The remaining paranasal sinuses and left mastoid air cells are well-aerated. No significant soft tissue abnormalities are seen.  CT CERVICAL SPINE FINDINGS  There is no evidence of fracture or subluxation. Vertebral bodies demonstrate normal height and alignment. Mild disc space narrowing is noted at C4-C5 and at C6-C7, with small anterior and posterior disc osteophyte complexes. Prevertebral soft tissues are within normal limits.  The thyroid gland is unremarkable in appearance. The visualized lung apices are clear. Calcification is noted about the carotid bifurcations bilaterally.  IMPRESSION: 1. No evidence of traumatic intracranial injury or fracture.  2. No  evidence of fracture or subluxation along the cervical spine. 3. Mild cortical volume loss and scattered small vessel ischemic microangiopathy. 4. Minimal partial opacification of the right mastoid air cells, and mild inspissated mucus at the base of the right maxillary sinus. 5. Calcification about the carotid bifurcations bilaterally. Carotid ultrasound would be helpful for further evaluation, when and as deemed clinically appropriate.   Electronically Signed   By: Garald Balding M.D.   On: 07/14/2014 23:41   Dg Chest Portable 1 View  07/14/2014   CLINICAL DATA:  Chest pain  EXAM: PORTABLE CHEST - 1 VIEW  COMPARISON:  05/30/2014  FINDINGS: Stable positioning of single chamber pacer/ ICD into the right ventricle. Stable heart size and mediastinal contours post CABG. There is no edema, consolidation, effusion, or pneumothorax. Remote and healed posterior right rib fractures.  IMPRESSION: No evidence of active disease   Electronically Signed   By: Monte Fantasia M.D.   On: 07/14/2014 21:53   Ct Angio Chest Aorta W/cm &/or Wo/cm  07/14/2014   CLINICAL DATA:  Severe chest pain  EXAM: CT ANGIOGRAPHY CHEST WITH CONTRAST  TECHNIQUE: Multidetector CT imaging of the chest was performed using the standard protocol during bolus administration of intravenous contrast. Multiplanar CT image reconstructions and MIPs were obtained to evaluate the vascular anatomy.  CONTRAST:  151mL OMNIPAQUE IOHEXOL 350 MG/ML SOLN  COMPARISON:  None.  FINDINGS: THORACIC INLET/BODY WALL:  Single chamber ICD/pacer from the left is in unremarkable position, tip at the right ventricular apex.  MEDIASTINUM:  Normal heart size. No pericardial effusion. No acute vascular abnormality, including aortic dissection or intramural hematoma. No central pulmonary embolism. Extensive atherosclerosis, including the coronary arteries. The patient is status post CABG. The LIMA is well opacified. The saphenous vein grafts have questionable patency with no  visible opacification.  LUNG WINDOWS:  Subsegmental bibasilar atelectasis. No consolidation. No effusion. No suspicious pulmonary nodule.  UPPER ABDOMEN:  Dense liver, correlate with patient's amiodarone use. Cholelithiasis without acute cholecystitis.  OSSEOUS:  Remote and healed posterior right rib fractures.  Review of the MIP images confirms the above findings.  IMPRESSION: 1. No acute aortic finding or central pulmonary embolism. 2. Extensive atherosclerosis, status post CABG. The LIMA is well opacified; doubtful patency of the saphenous vein grafts. 3. Bibasilar atelectasis. 4. Cholelithiasis   Electronically Signed   By: Monte Fantasia M.D.   On: 07/14/2014 23:52    EKG:   Orders placed or performed during the hospital encounter of 07/14/14  . EKG 12-Lead  . EKG 12-Lead  . EKG 12-Lead  . EKG 12-Lead    ASSESSMENT AND PLAN:   1. Chest pain with trending cardiac enzymes and abnormal repeat EKG- probably non-STEMI.  Stat heparin and nitroglycerin drip Transfer patient to intensive care unit Stat cardiology consult-discuss with Dr. Ubaldo Glassing , appreciate his recommendations  continue aspirin and  beta blocker. No statin because of allergy. Cycle cardiac enzymes, order echo cardiogram Cardiology might consider cardiac catheterization if clinically no improvement  2. Syncope with superficial injury to head. Can be from cardiac arrhythmias /History of chronic vertigo imbalance  AICD interrogation with V. Fib as per cardiology report Currently in sinus rhythm, amiodarone dose is increased to 400 mg by mouth twice a day. Meclizine for vertigo Neurology consult is placed  CT of the head and C-spine negative for acute injury/bleed.  3. Nausea and vomiting with associated dizziness- probably from benign paroxysmal positional vertigo/differential diagnosis concussion syndrome  IV Zofran. [facility]  consult Will consider repeat CAT scan of the head if needed  4. History of coronary  artery disease, status post CABG. series to cardiology as an outpatient 5. History of systolic congestive heart failure, compensated on medical management. With previous ejection fraction at 20% as of 2015 6. Rheumatoid arthritis, stable on oral prednisone. 7. History of V. tach status post AICD, stable. Telemetry monitoring. 8. Gait abnormality with balance problems, chronic. Plan: PT/OT consultation.     All the records are reviewed and case discussed with Care Management/Social Workerr. Management plans discussed with the patient, family and they are in agreement.  CODE STATUS: Full code  TOTAL CRITICAL CARE TIME TAKING CARE OF THIS PATIENT: 60 minutes.   POSSIBLE D/C IN 2-3 DAYS, DEPENDING ON CLINICAL CONDITION.   Nicholes Mango M.D on 07/15/2014 at 1:18 PM  Between 7am to 6pm - Pager - [phone] After 6pm go to [email or web address] - password [email or web address] [facility]  Office  [phone]  CC: Primary care physician; [name], MD

## 2014-07-15 NOTE — ED Provider Notes (Signed)
  Physical Exam  BP 121/74 mmHg  Pulse 76  Temp(Src) 97.7 F (36.5 C) (Oral)  Resp 22  Ht 6' (1.829 m)  Wt 197 lb 11.2 oz (89.676 kg)  BMI 26.81 kg/m2  SpO2 99% ----------------------------------------- 12:17 AM on 07/15/2014 -----------------------------------------   Physical Exam Patient resting comfortably at this time. No complaints of chest pain at this time.  CT of the chest without acute aortic pathology or PE. ED Course  Procedures  MDM Patient with syncope and chest pain. Also with nausea in the emergency department. We'll admit to the hospital for further workup and treatment. Signed out to Dr. Reece Levy at 12:10 AM.      Orbie Pyo, MD 07/15/14 [phone]

## 2014-07-16 LAB — CBC
HEMATOCRIT: 36.3 % — AB (ref 40.0–52.0)
Hemoglobin: 11.8 g/dL — ABNORMAL LOW (ref 13.0–18.0)
MCH: 29.5 pg (ref 26.0–34.0)
MCHC: 32.6 g/dL (ref 32.0–36.0)
MCV: 90.6 fL (ref 80.0–100.0)
PLATELETS: 149 10*3/uL — AB (ref 150–440)
RBC: 4.01 MIL/uL — ABNORMAL LOW (ref 4.40–5.90)
RDW: 14.2 % (ref 11.5–14.5)
WBC: 7.9 10*3/uL (ref 3.8–10.6)

## 2014-07-16 LAB — HEPARIN LEVEL (UNFRACTIONATED): Heparin Unfractionated: 0.54 IU/mL (ref 0.30–0.70)

## 2014-07-16 MED ORDER — ENSURE ENLIVE PO LIQD
237.0000 mL | Freq: Two times a day (BID) | ORAL | Status: DC
  Administered 2014-07-17: 237 mL via ORAL

## 2014-07-16 NOTE — Progress Notes (Signed)
Patient is alert and oriented. Reporting intermittent musculoskeletal soreness to shoulders and chest improved with tylenol PRN. Dietician consult placed for poor PO intake per patient. Up to chair for bath, tolerated well. Minimal food intake, tolerating PO fluids today. Voiding in urinal. Resting quietly between care. Will continue to monitor.

## 2014-07-16 NOTE — Progress Notes (Signed)
[facility] [place] NAME: [name]    MR#:  [id number]  DATE OF BIRTH:  10-21-1940  SUBJECTIVE:  CHIEF COMPLAINT:  Patient is resting comfortably. Denies any chest pain or shortness of breath. No overnight events.  REVIEW OF SYSTEMS:  CONSTITUTIONAL: No fever, fatigue or weakness.  EYES: No blurred or double vision.  EARS, NOSE, AND THROAT: No tinnitus or ear pain.  RESPIRATORY: No cough, shortness of breath, wheezing or hemoptysis.  CARDIOVASCULAR: Denies any chest pain or shortness of breath, denies orthopnea, edema.  GASTROINTESTINAL: No nausea, vomiting, diarrhea or abdominal pain.  GENITOURINARY: No dysuria, hematuria.  ENDOCRINE: No polyuria, nocturia,  HEMATOLOGY: No anemia, easy bruising or bleeding SKIN: No rash or lesion. MUSCULOSKELETAL: No joint pain or arthritis.   NEUROLOGIC: No tingling, numbness, weakness.  PSYCHIATRY: No anxiety or depression.   DRUG ALLERGIES:   Allergies  Allergen Reactions  . Nitroglycerin Other (See Comments)    "head ache, worse than a migraine"  . Pravastatin Other (See Comments)    VITALS:  Blood pressure 106/56, pulse 82, temperature 98.2 F (36.8 C), temperature source Oral, resp. rate 19, height 6' (1.829 m), weight 89.177 kg (196 lb 9.6 oz), SpO2 99 %.  PHYSICAL EXAMINATION:  GENERAL:  74 y.o.-year-old patient lying in the bed with no acute distress.  EYES: Pupils equal, round, reactive to light and accommodation. No scleral icterus. Extraocular muscles intact.  HEENT: Head atraumatic, normocephalic. Oropharynx and nasopharynx clear.  NECK:  Supple, no jugular venous distention. No thyroid enlargement, no tenderness.  LUNGS: Normal breath sounds bilaterally, no wheezing, rales,rhonchi or crepitation. No use of accessory muscles of respiration.  CARDIOVASCULAR: S1, S2 normal. No murmurs, rubs, or gallops. No anterior chest wall tenderness ABDOMEN: Soft, nontender,  nondistended. Bowel sounds present. No organomegaly or mass.  EXTREMITIES: No pedal edema, cyanosis, or clubbing.  NEUROLOGIC: Cranial nerves II through XII are intact. Muscle strength 5/5 in all extremities. Sensation intact. Gait not checked.  PSYCHIATRIC: The patient is alert and oriented x 3.  SKIN: No obvious rash, lesion, or ulcer.    LABORATORY PANEL:   CBC  Recent Labs Lab 07/16/14 0654  WBC 7.9  HGB 11.8*  HCT 36.3*  PLT 149*   ------------------------------------------------------------------------------------------------------------------  Chemistries   Recent Labs Lab 07/15/14 0922 07/15/14 1048  NA 137  --   K 4.5 4.4  CL 102  --   CO2 25  --   GLUCOSE 121*  --   BUN 22*  --   CREATININE 1.02  --   CALCIUM 8.2*  --   AST 124*  --   ALT 116*  --   ALKPHOS 58  --   BILITOT 0.4  --    ------------------------------------------------------------------------------------------------------------------  Cardiac Enzymes  Recent Labs Lab 07/15/14 1732  TROPONINI 0.55*   ------------------------------------------------------------------------------------------------------------------  RADIOLOGY:  Ct Angio Head W/cm &/or Wo Cm  07/15/2014   CLINICAL DATA:  Nausea.  Dizziness and headache.  EXAM: CT ANGIOGRAPHY HEAD AND NECK  TECHNIQUE: Multidetector CT imaging of the head and neck was performed using the standard protocol during bolus administration of intravenous contrast. Multiplanar CT image reconstructions and MIPs were obtained to evaluate the vascular anatomy. Carotid stenosis measurements (when applicable) are obtained utilizing NASCET criteria, using the distal internal carotid diameter as the denominator.  CONTRAST:  153mL OMNIPAQUE IOHEXOL 350 MG/ML SOLN  COMPARISON:  CT head 07/14/2014  FINDINGS: CT HEAD  Brain: Unenhanced head CT performed yesterday was  not repeated. Generalized atrophy. Negative for hemorrhage or mass. Mild chronic microvascular  ischemic change in the white matter. No acute infarct.  Calvarium and skull base: Negative  Paranasal sinuses: Bony thickening and mucosal edema of the right maxillary sinus secondary to chronic sinusitis. No air-fluid level.  Orbits: Negative  CTA NECK  Aortic arch: Atherosclerotic calcification in the aortic arch and proximal great vessels without significant stenosis. Lung apices are clear.  Right carotid system: Circumferential atherosclerotic calcification involving the proximal right internal carotid artery narrowing the lumen by approximately 50% diameter stenosis. Note is made of artifact through the bifurcation due to dental amalgam. No dissection. Right external carotid artery widely patent  Left carotid system: Left common carotid artery widely patent. Atherosclerotic calcification the carotid bulb with 25% diameter stenosis the lumen. Artifact through the carotid bulb due to dental amalgam. External carotid artery patent  Vertebral arteries:Calcific stenosis of the origin of the vertebral artery bilaterally. Circumferential atherosclerotic calcification and moderate stenosis of the distal vertebral artery bilaterally. Remainder of the vertebral arteries are widely patent.  Skeleton: Mild cervical degenerative change. No acute bony abnormality.  Other neck: Negative for mass or adenopathy in the neck.  CTA HEAD  Anterior circulation: Atherosclerotic calcification throughout the right cavernous carotid artery causing moderate stenosis. Right anterior and middle cerebral arteries are patent without stenosis  Atherosclerotic calcification throughout the left cavernous carotid with moderate stenosis. Left anterior and middle cerebral arteries are patent bilaterally without stenosis.  Posterior circulation: Moderate stenosis distal vertebral artery bilaterally due to calcific stenosis. PICA patent bilaterally. Basilar is patent. Superior cerebellar and posterior cerebral arteries are patent without significant  stenosis.  Venous sinuses: Patent  Anatomic variants: Negative for cerebral aneurysm.  Delayed phase: No enhancing lesions postcontrast administration  IMPRESSION: 50% diameter stenosis due to calcific plaque proximal right internal carotid artery.  25% stenosis due to calcific plaque proximal left internal carotid artery.  Moderate stenosis proximal and distal vertebral artery bilaterally due to calcified stenosis  Moderate stenosis cavernous carotid bilaterally due to calcified stenosis.   Electronically Signed   By: Franchot Gallo M.D.   On: 07/15/2014 17:56   Ct Head Wo Contrast  07/14/2014   CLINICAL DATA:  Status post fall in bathroom, with vomiting. Patient does not remember fall. Concern for cervical spine injury. Initial encounter.  EXAM: CT HEAD WITHOUT CONTRAST  CT CERVICAL SPINE WITHOUT CONTRAST  TECHNIQUE: Multidetector CT imaging of the head and cervical spine was performed following the standard protocol without intravenous contrast. Multiplanar CT image reconstructions of the cervical spine were also generated.  COMPARISON:  CT of the cervical spine performed 01/10/2014  FINDINGS: CT HEAD FINDINGS  There is no evidence of acute infarction, mass lesion, or intra- or extra-axial hemorrhage on CT.  Prominence of the ventricles and sulci reflects mild cortical volume loss. Mild cerebellar atrophy is noted. Scattered periventricular white matter change likely reflects small vessel ischemic microangiopathy.  The brainstem and fourth ventricle are within normal limits. The basal ganglia are unremarkable in appearance. The cerebral hemispheres demonstrate grossly normal gray-white differentiation. No mass effect or midline shift is seen.  There is no evidence of fracture; visualized osseous structures are unremarkable in appearance. The visualized portions of the orbits are within normal limits. There is minimal partial opacification of the right mastoid air cells. Mild inspissated mucus is noted at the  base of the right maxillary sinus. The remaining paranasal sinuses and left mastoid air cells are well-aerated. No significant soft tissue abnormalities  are seen.  CT CERVICAL SPINE FINDINGS  There is no evidence of fracture or subluxation. Vertebral bodies demonstrate normal height and alignment. Mild disc space narrowing is noted at C4-C5 and at C6-C7, with small anterior and posterior disc osteophyte complexes. Prevertebral soft tissues are within normal limits.  The thyroid gland is unremarkable in appearance. The visualized lung apices are clear. Calcification is noted about the carotid bifurcations bilaterally.  IMPRESSION: 1. No evidence of traumatic intracranial injury or fracture. 2. No evidence of fracture or subluxation along the cervical spine. 3. Mild cortical volume loss and scattered small vessel ischemic microangiopathy. 4. Minimal partial opacification of the right mastoid air cells, and mild inspissated mucus at the base of the right maxillary sinus. 5. Calcification about the carotid bifurcations bilaterally. Carotid ultrasound would be helpful for further evaluation, when and as deemed clinically appropriate.   Electronically Signed   By: Garald Balding M.D.   On: 07/14/2014 23:41   Ct Angio Neck W/cm &/or Wo/cm  07/15/2014   CLINICAL DATA:  Nausea.  Dizziness and headache.  EXAM: CT ANGIOGRAPHY HEAD AND NECK  TECHNIQUE: Multidetector CT imaging of the head and neck was performed using the standard protocol during bolus administration of intravenous contrast. Multiplanar CT image reconstructions and MIPs were obtained to evaluate the vascular anatomy. Carotid stenosis measurements (when applicable) are obtained utilizing NASCET criteria, using the distal internal carotid diameter as the denominator.  CONTRAST:  177mL OMNIPAQUE IOHEXOL 350 MG/ML SOLN  COMPARISON:  CT head 07/14/2014  FINDINGS: CT HEAD  Brain: Unenhanced head CT performed yesterday was not repeated. Generalized atrophy. Negative  for hemorrhage or mass. Mild chronic microvascular ischemic change in the white matter. No acute infarct.  Calvarium and skull base: Negative  Paranasal sinuses: Bony thickening and mucosal edema of the right maxillary sinus secondary to chronic sinusitis. No air-fluid level.  Orbits: Negative  CTA NECK  Aortic arch: Atherosclerotic calcification in the aortic arch and proximal great vessels without significant stenosis. Lung apices are clear.  Right carotid system: Circumferential atherosclerotic calcification involving the proximal right internal carotid artery narrowing the lumen by approximately 50% diameter stenosis. Note is made of artifact through the bifurcation due to dental amalgam. No dissection. Right external carotid artery widely patent  Left carotid system: Left common carotid artery widely patent. Atherosclerotic calcification the carotid bulb with 25% diameter stenosis the lumen. Artifact through the carotid bulb due to dental amalgam. External carotid artery patent  Vertebral arteries:Calcific stenosis of the origin of the vertebral artery bilaterally. Circumferential atherosclerotic calcification and moderate stenosis of the distal vertebral artery bilaterally. Remainder of the vertebral arteries are widely patent.  Skeleton: Mild cervical degenerative change. No acute bony abnormality.  Other neck: Negative for mass or adenopathy in the neck.  CTA HEAD  Anterior circulation: Atherosclerotic calcification throughout the right cavernous carotid artery causing moderate stenosis. Right anterior and middle cerebral arteries are patent without stenosis  Atherosclerotic calcification throughout the left cavernous carotid with moderate stenosis. Left anterior and middle cerebral arteries are patent bilaterally without stenosis.  Posterior circulation: Moderate stenosis distal vertebral artery bilaterally due to calcific stenosis. PICA patent bilaterally. Basilar is patent. Superior cerebellar and  posterior cerebral arteries are patent without significant stenosis.  Venous sinuses: Patent  Anatomic variants: Negative for cerebral aneurysm.  Delayed phase: No enhancing lesions postcontrast administration  IMPRESSION: 50% diameter stenosis due to calcific plaque proximal right internal carotid artery.  25% stenosis due to calcific plaque proximal left internal carotid artery.  Moderate stenosis proximal and distal vertebral artery bilaterally due to calcified stenosis  Moderate stenosis cavernous carotid bilaterally due to calcified stenosis.   Electronically Signed   By: Franchot Gallo M.D.   On: 07/15/2014 17:56   Ct Cervical Spine Wo Contrast  07/14/2014   CLINICAL DATA:  Status post fall in bathroom, with vomiting. Patient does not remember fall. Concern for cervical spine injury. Initial encounter.  EXAM: CT HEAD WITHOUT CONTRAST  CT CERVICAL SPINE WITHOUT CONTRAST  TECHNIQUE: Multidetector CT imaging of the head and cervical spine was performed following the standard protocol without intravenous contrast. Multiplanar CT image reconstructions of the cervical spine were also generated.  COMPARISON:  CT of the cervical spine performed 01/10/2014  FINDINGS: CT HEAD FINDINGS  There is no evidence of acute infarction, mass lesion, or intra- or extra-axial hemorrhage on CT.  Prominence of the ventricles and sulci reflects mild cortical volume loss. Mild cerebellar atrophy is noted. Scattered periventricular white matter change likely reflects small vessel ischemic microangiopathy.  The brainstem and fourth ventricle are within normal limits. The basal ganglia are unremarkable in appearance. The cerebral hemispheres demonstrate grossly normal gray-white differentiation. No mass effect or midline shift is seen.  There is no evidence of fracture; visualized osseous structures are unremarkable in appearance. The visualized portions of the orbits are within normal limits. There is minimal partial opacification of  the right mastoid air cells. Mild inspissated mucus is noted at the base of the right maxillary sinus. The remaining paranasal sinuses and left mastoid air cells are well-aerated. No significant soft tissue abnormalities are seen.  CT CERVICAL SPINE FINDINGS  There is no evidence of fracture or subluxation. Vertebral bodies demonstrate normal height and alignment. Mild disc space narrowing is noted at C4-C5 and at C6-C7, with small anterior and posterior disc osteophyte complexes. Prevertebral soft tissues are within normal limits.  The thyroid gland is unremarkable in appearance. The visualized lung apices are clear. Calcification is noted about the carotid bifurcations bilaterally.  IMPRESSION: 1. No evidence of traumatic intracranial injury or fracture. 2. No evidence of fracture or subluxation along the cervical spine. 3. Mild cortical volume loss and scattered small vessel ischemic microangiopathy. 4. Minimal partial opacification of the right mastoid air cells, and mild inspissated mucus at the base of the right maxillary sinus. 5. Calcification about the carotid bifurcations bilaterally. Carotid ultrasound would be helpful for further evaluation, when and as deemed clinically appropriate.   Electronically Signed   By: Garald Balding M.D.   On: 07/14/2014 23:41   Dg Chest Portable 1 View  07/14/2014   CLINICAL DATA:  Chest pain  EXAM: PORTABLE CHEST - 1 VIEW  COMPARISON:  05/30/2014  FINDINGS: Stable positioning of single chamber pacer/ ICD into the right ventricle. Stable heart size and mediastinal contours post CABG. There is no edema, consolidation, effusion, or pneumothorax. Remote and healed posterior right rib fractures.  IMPRESSION: No evidence of active disease   Electronically Signed   By: Monte Fantasia M.D.   On: 07/14/2014 21:53   Ct Angio Chest Aorta W/cm &/or Wo/cm  07/14/2014   CLINICAL DATA:  Severe chest pain  EXAM: CT ANGIOGRAPHY CHEST WITH CONTRAST  TECHNIQUE: Multidetector CT imaging  of the chest was performed using the standard protocol during bolus administration of intravenous contrast. Multiplanar CT image reconstructions and MIPs were obtained to evaluate the vascular anatomy.  CONTRAST:  180mL OMNIPAQUE IOHEXOL 350 MG/ML SOLN  COMPARISON:  None.  FINDINGS: THORACIC INLET/BODY WALL:  Single chamber ICD/pacer from the left is in unremarkable position, tip at the right ventricular apex.  MEDIASTINUM:  Normal heart size. No pericardial effusion. No acute vascular abnormality, including aortic dissection or intramural hematoma. No central pulmonary embolism. Extensive atherosclerosis, including the coronary arteries. The patient is status post CABG. The LIMA is well opacified. The saphenous vein grafts have questionable patency with no visible opacification.  LUNG WINDOWS:  Subsegmental bibasilar atelectasis. No consolidation. No effusion. No suspicious pulmonary nodule.  UPPER ABDOMEN:  Dense liver, correlate with patient's amiodarone use. Cholelithiasis without acute cholecystitis.  OSSEOUS:  Remote and healed posterior right rib fractures.  Review of the MIP images confirms the above findings.  IMPRESSION: 1. No acute aortic finding or central pulmonary embolism. 2. Extensive atherosclerosis, status post CABG. The LIMA is well opacified; doubtful patency of the saphenous vein grafts. 3. Bibasilar atelectasis. 4. Cholelithiasis   Electronically Signed   By: Monte Fantasia M.D.   On: 07/14/2014 23:52    EKG:   Orders placed or performed during the hospital encounter of 07/14/14  . EKG 12-Lead  . EKG 12-Lead  . EKG 12-Lead  . EKG 12-Lead    ASSESSMENT AND PLAN:   1. Chest pain with trending cardiac enzymes and abnormal repeat EKG- probably non-STEMI.  On heparin gtt and nitroglycerin when necessary  cardiology Dr. Ubaldo Glassing , appreciate his recommendations  continue aspirin and  beta blocker. No statin because of allergy. s/p echo cardiogram with no fluid overload Cardiology might  consider cardiac catheterization if clinically no improvement  2. Syncope with superficial injury to head. Can be from cardiac arrhythmias /History of chronic vertigo imbalance  AICD interrogation with V. Fib as per cardiology report Currently in sinus rhythm, amiodarone dose is increased to 400 mg by mouth twice a day. Meclizine for vertigo Neurology consult -appreciate their recommendations CT of the head and C-spine negative for acute injury/bleed.  3. Nausea and vomiting with associated dizziness- probably from benign paroxysmal positional vertigo/differential diagnosis concussion syndrome  IV Zofran. [facility] is following Will consider repeat CAT scan of the head if needed  4. History of coronary artery disease, status post CABG. series to cardiology as an outpatient 5. History of systolic congestive heart failure, compensated on medical management. With previous ejection fraction at 20% as of 2015 6. Rheumatoid arthritis, stable on oral prednisone. 7. History of V. tach status post AICD, stable. Telemetry monitoring. 8. Gait abnormality with balance problems, chronic. Plan: PT/OT consultation once patient is clinically stable  We will change him to stepdown   All the records are reviewed and case discussed with Care Management/Social Workerr. Management plans discussed with the patient, family and they are in agreement.  CODE STATUS: Full code  TOTAL CRITICAL CARE TIME TAKING CARE OF THIS PATIENT: 40 minutes.   POSSIBLE D/C IN 2-3 DAYS, DEPENDING ON CLINICAL CONDITION.   Nicholes Mango M.D on 07/16/2014 at 8:55 AM  Between 7am to 6pm - Pager - [phone] After 6pm go to [email or web address] - password [email or web address] [facility]  Office  [phone]  CC: Primary care physician; [name], MD

## 2014-07-16 NOTE — Progress Notes (Signed)
ANTICOAGULATION CONSULT NOTE - Follow Up Consult  Pharmacy Consult for Heparin Drip Indication: chest pain/ACS  Allergies  Allergen Reactions  . Nitroglycerin Other (See Comments)    "head ache, worse than a migraine"  . Pravastatin Other (See Comments)    Patient Measurements: Height: 6' (182.9 cm) Weight: 196 lb 9.6 oz (89.177 kg) IBW/kg (Calculated) : 77.6 Heparin Dosing Weight: 89.2 kg  Vital Signs: Temp: 98.2 F (36.8 C) (07/10 0800) Temp Source: Oral (07/10 0800) BP: 106/56 mmHg (07/10 0700) Pulse Rate: 82 (07/10 0800)  Labs:  Recent Labs  07/14/14 2140 07/15/14 0644 07/15/14 0922 07/15/14 1732 07/16/14 0654  HGB 12.8* 12.9*  --   --  11.8*  HCT 38.9* 40.2  --   --  36.3*  PLT 203 186  --   --  149*  APTT 23*  --   --   --   --   LABPROT 14.3  --   --   --   --   INR 1.09  --   --   --   --   HEPARINUNFRC  --   --   --  0.53 0.54  CREATININE 1.34* 0.95 1.02  --   --   TROPONINI 0.03 0.25* 0.32* 0.55*  --     Estimated Creatinine Clearance: 69.7 mL/min (by C-G formula based on Cr of 1.02).   Medical History: Past Medical History  Diagnosis Date  . Leg weakness   . Balance problem   . Arthritis   . Sinus headache   . High cholesterol   . Collagen vascular disease     RA since 2014  . CHF (congestive heart failure)   . Coronary artery disease   . Myocardial infarction   . AICD (automatic cardioverter/defibrillator) present   . Dysrhythmia     Medications:  Scheduled:  . amiodarone  400 mg Oral BID  . aspirin EC  81 mg Oral Daily  . carvedilol  3.125 mg Oral BID WC  . cholecalciferol  2,000 Units Oral Daily  . docusate sodium  100 mg Oral Daily  . fluticasone  1 spray Each Nare Daily  . furosemide  40 mg Oral Daily  . loratadine  10 mg Oral Daily  . losartan  50 mg Oral Daily  . meclizine  25 mg Oral TID  . promethazine  12.5 mg Intravenous Once  . sodium chloride  3 mL Intravenous Q12H  . spironolactone  25 mg Oral Daily   Infusions:   . heparin 1,150 Units/hr (07/16/14 0800)    Assessment: Patient is a 74 yo male transferred to ICU  due to EKG changes suggesting possible ischemia. Nitroglycerin drip initiated per cardiology.  Pharmacy consulted to initiate and titrate heparin drip. Heparin drip infusing at 1150 units/hr.  7/10 HL at 0654: 0.54  Goal of Therapy:  Heparin level 0.3-0.7 units/ml Monitor platelets by anticoagulation protocol: Yes   Plan:  Will continue with current rate of 1150 units/hr as HL was within desired range. Re-Check HL in AM. Will order CBC in AM.   Murrell Converse, PharmD Clinical Pharmacist 07/16/2014

## 2014-07-16 NOTE — Progress Notes (Signed)
Patient stable overnight. A&O. Clear lungs 2l O2. Voids with adequate output. Heparin drip@1150units . VSS. sr 1st deg avb. See charting for assessments. Dizziness somewhat improved per patient.

## 2014-07-16 NOTE — Progress Notes (Signed)
PT Cancellation Note  Patient Details Name: [name] MRN: [id number] DOB: 17-Feb-1940   Cancelled Treatment:     Chart reviewed; pt with discontinued PT order.  Please send new order when pt appropriate for PT.  Thank you.   Ayven Glasco A Jullian Clayson 07/16/2014, 10:40 AM

## 2014-07-17 LAB — CBC
HEMATOCRIT: 37.3 % — AB (ref 40.0–52.0)
Hemoglobin: 12.1 g/dL — ABNORMAL LOW (ref 13.0–18.0)
MCH: 29.7 pg (ref 26.0–34.0)
MCHC: 32.4 g/dL (ref 32.0–36.0)
MCV: 91.6 fL (ref 80.0–100.0)
PLATELETS: 149 10*3/uL — AB (ref 150–440)
RBC: 4.08 MIL/uL — AB (ref 4.40–5.90)
RDW: 14.1 % (ref 11.5–14.5)
WBC: 7.8 10*3/uL (ref 3.8–10.6)

## 2014-07-17 LAB — HEPARIN LEVEL (UNFRACTIONATED): HEPARIN UNFRACTIONATED: 0.5 [IU]/mL (ref 0.30–0.70)

## 2014-07-17 LAB — BASIC METABOLIC PANEL
Anion gap: 6 (ref 5–15)
BUN: 17 mg/dL (ref 6–20)
CALCIUM: 8 mg/dL — AB (ref 8.9–10.3)
CHLORIDE: 101 mmol/L (ref 101–111)
CO2: 30 mmol/L (ref 22–32)
Creatinine, Ser: 0.99 mg/dL (ref 0.61–1.24)
GFR calc Af Amer: >60 mL/min (ref >60)
GFR calc non Af Amer: >60 mL/min (ref >60)
Glucose, Bld: 97 mg/dL (ref 65–99)
POTASSIUM: 3.1 mmol/L — AB (ref 3.5–5.1)
SODIUM: 137 mmol/L (ref 135–145)

## 2014-07-17 LAB — MAGNESIUM: Magnesium: 1.9 mg/dL (ref 1.7–2.4)

## 2014-07-17 MED ORDER — POTASSIUM CHLORIDE 20 MEQ PO PACK
40.0000 meq | PACK | Freq: Once | ORAL | Status: AC
  Administered 2014-07-17: 40 meq via ORAL
  Filled 2014-07-17: qty 2

## 2014-07-17 MED ORDER — FUROSEMIDE 40 MG PO TABS: 40.0000 mg | ORAL_TABLET | ORAL | Status: DC

## 2014-07-17 MED ORDER — POTASSIUM CHLORIDE 20 MEQ PO PACK
40.0000 meq | PACK | Freq: Once | ORAL | Status: AC
  Administered 2014-07-17: 40 meq via ORAL

## 2014-07-17 NOTE — Progress Notes (Signed)
Pt BP is 87/35. Paged Dr. Margaretmary Eddy.

## 2014-07-17 NOTE — Evaluation (Signed)
Physical Therapy Evaluation Patient Details Name: [name] MRN: [id number] DOB: [birth year] Today's Date: 07/17/2014   History of Present Illness  presented to ER status post fall in home environment with head injury/laceration; admitted for chest pain.  Hospital course significant for transfer to CCU for heparin and nitroglycerin drip (now discontinued); elevated troponin consistent with NSTEMI (peak at 0.55).  No further cardiac work-up indicated per notes.  Cleared by RN for participation with session at this time.  Clinical Impression  Upon evaluation, patient alert and oriented; follows all commands and demonstrates good safety awareness/insight.  Strength and ROM grossly WFL and symmetrical, functional for basic transfers and mobility.  Able to complete bed mobility indep; sit/stand, basic transfers and gait (150') with RW, cga for optimal safety.  Able to complete 5x sit/stand in 11 seconds with good safety/stability.  Mild reports of dizziness with initial transition to upright (orthostatics negative), but appears to stabilize with accommodation to position.  Feels dizziness worsened with head turns and rolling; may consider vestibular assessment in subsequent sessions (negative resting nystagmus, head shaking nystagmus or skew that likely rules out any potential central cause).  Will continue to assess as appropriate. Would benefit from skilled PT to address above deficits and promote optimal return to PLOF; recommend transition to home with outpatient PT upon discharge.    Follow Up Recommendations Outpatient PT    Equipment Recommendations  Rolling walker with 5" wheels    Recommendations for Other Services       Precautions / Restrictions Precautions Precautions: Fall Restrictions Weight Bearing Restrictions: No      Mobility  Bed Mobility Overal bed mobility: Independent                Transfers Overall transfer level: Needs assistance Equipment used:  Rolling walker (2 wheeled) Transfers: Sit to/from Stand Sit to Stand: Min guard  5x sit/stand (from recliner without UE support), 11 seconds--good stability and LE control; did require use of UEs to assist         General transfer comment: cuing for hand/foot placement  Ambulation/Gait Ambulation/Gait assistance: Min guard Ambulation Distance (Feet): 150 Feet Assistive device: Rolling walker (2 wheeled)       General Gait Details: reciprocal stepping pattern with fair step height/length; excessive weight shift to R with increased R stance time; no overt buckling or LOB.  Minimal reports of dizziness with initial transition to upright, but stabilizes with continued upright positioning.  Stairs            Wheelchair Mobility    Modified Rankin (Stroke Patients Only)       Balance Overall balance assessment: Needs assistance Sitting-balance support: No upper extremity supported;Feet supported Sitting balance-Leahy Scale: Normal     Standing balance support: No upper extremity supported Standing balance-Leahy Scale: Good                               Pertinent Vitals/Pain Pain Assessment: No/denies pain    Home Living Family/patient expects to be discharged to:: Private residence Living Arrangements: Spouse/significant other Available Help at Discharge: Family Type of Home: House Home Access: Stairs to enter (3 steps, no rails) Entrance Stairs-Rails: None   Home Layout: One level Home Equipment: Cane - single point      Prior Function Level of Independence: Independent         Comments: Indep with all household, community mobility; + driving; 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  Reports no additional falls outside of this episode.     Hand Dominance        Extremity/Trunk Assessment   Upper Extremity Assessment: Overall WFL for tasks assessed           Lower Extremity Assessment: Overall WFL for tasks assessed (grossly symmetrical  throughout bilat; reports mild numbness throughout L LE (baseline per patient), but able to localize)         Communication   Communication: No difficulties  Cognition Arousal/Alertness: Awake/alert Behavior During Therapy: WFL for tasks assessed/performed Overall Cognitive Status: Within Functional Limits for tasks assessed                      General Comments      Exercises        Assessment/Plan    PT Assessment Patient needs continued PT services  PT Diagnosis Difficulty walking   PT Problem List Decreased activity tolerance;Decreased balance;Decreased safety awareness;Cardiopulmonary status limiting activity;Decreased mobility;Impaired sensation  PT Treatment Interventions Stair training;Gait training;DME instruction;Therapeutic activities;Therapeutic exercise;Functional mobility training;Patient/family education;Balance training   PT Goals (Current goals can be found in the Care Plan section) Acute Rehab PT Goals 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 Owens Shark, PT, DPT, NCS 07/17/2014, 4:21 PM [phone]

## 2014-07-17 NOTE — Progress Notes (Signed)
[facility] [place] NAME: [name]    MR#:  [id number]  DATE OF BIRTH:  [birth year]  SUBJECTIVE:  CHIEF COMPLAINT:  Patient is resting comfortably. Denies any chest pain or shortness of breath. On heparin drip Hypotensive this a.m.  REVIEW OF SYSTEMS:  CONSTITUTIONAL: No fever, fatigue or weakness.  EYES: No blurred or double vision.  EARS, NOSE, AND THROAT: No tinnitus or ear pain.  RESPIRATORY: No cough, shortness of breath, wheezing or hemoptysis.  CARDIOVASCULAR: Denies any chest pain or shortness of breath, denies orthopnea, edema.  GASTROINTESTINAL: No nausea, vomiting, diarrhea or abdominal pain.  GENITOURINARY: No dysuria, hematuria.  ENDOCRINE: No polyuria, nocturia,  HEMATOLOGY: No anemia, easy bruising or bleeding SKIN: No rash or lesion. MUSCULOSKELETAL: No joint pain or arthritis.   NEUROLOGIC: No tingling, numbness, weakness.  PSYCHIATRY: No anxiety or depression.   DRUG ALLERGIES:   Allergies  Allergen Reactions  . Nitroglycerin Other (See Comments)    "head ache, worse than a migraine"  . Pravastatin Other (See Comments)    VITALS:  Blood pressure 103/57, pulse 72, temperature 97.8 F (36.6 C), temperature source Oral, resp. rate 20, height 6' (1.829 m), weight 88.4 kg (194 lb 14.2 oz), SpO2 94 %.  PHYSICAL EXAMINATION:  GENERAL:  74 y.o.-year-old patient lying in the bed with no acute distress.  EYES: Pupils equal, round, reactive to light and accommodation. No scleral icterus. Extraocular muscles intact.  HEENT: Head atraumatic, normocephalic. Oropharynx and nasopharynx clear.  NECK:  Supple, no jugular venous distention. No thyroid enlargement, no tenderness.  LUNGS: Normal breath sounds bilaterally, no wheezing, rales,rhonchi or crepitation. No use of accessory muscles of respiration.  CARDIOVASCULAR: S1, S2 normal. No murmurs, rubs, or gallops. No anterior chest wall tenderness ABDOMEN: Soft,  nontender, nondistended. Bowel sounds present. No organomegaly or mass.  EXTREMITIES: No pedal edema, cyanosis, or clubbing.  NEUROLOGIC: Cranial nerves II through XII are intact. Muscle strength 5/5 in all extremities. Sensation intact. Gait not checked.  PSYCHIATRIC: The patient is alert and oriented x 3.  SKIN: No obvious rash, lesion, or ulcer.    LABORATORY PANEL:   CBC  Recent Labs Lab 07/17/14 0437  WBC 7.8  HGB 12.1*  HCT 37.3*  PLT 149*   ------------------------------------------------------------------------------------------------------------------  Chemistries   Recent Labs Lab 07/15/14 0922  07/17/14 0437  NA 137  --  137  K 4.5  < > 3.1*  CL 102  --  101  CO2 25  --  30  GLUCOSE 121*  --  97  BUN 22*  --  17  CREATININE 1.02  --  0.99  CALCIUM 8.2*  --  8.0*  MG  --   --  1.9  AST 124*  --   --   ALT 116*  --   --   ALKPHOS 58  --   --   BILITOT 0.4  --   --   < > = values in this interval not displayed. ------------------------------------------------------------------------------------------------------------------  Cardiac Enzymes  Recent Labs Lab 07/15/14 1732  TROPONINI 0.55*   ------------------------------------------------------------------------------------------------------------------  RADIOLOGY:  Ct Angio Head W/cm &/or Wo Cm  07/15/2014   CLINICAL DATA:  Nausea.  Dizziness and headache.  EXAM: CT ANGIOGRAPHY HEAD AND NECK  TECHNIQUE: Multidetector CT imaging of the head and neck was performed using the standard protocol during bolus administration of intravenous contrast. Multiplanar CT image reconstructions and MIPs were obtained to evaluate the vascular anatomy. Carotid stenosis measurements (  when applicable) are obtained utilizing NASCET criteria, using the distal internal carotid diameter as the denominator.  CONTRAST:  155mL OMNIPAQUE IOHEXOL 350 MG/ML SOLN  COMPARISON:  CT head 07/14/2014  FINDINGS: CT HEAD  Brain: Unenhanced head  CT performed yesterday was not repeated. Generalized atrophy. Negative for hemorrhage or mass. Mild chronic microvascular ischemic change in the white matter. No acute infarct.  Calvarium and skull base: Negative  Paranasal sinuses: Bony thickening and mucosal edema of the right maxillary sinus secondary to chronic sinusitis. No air-fluid level.  Orbits: Negative  CTA NECK  Aortic arch: Atherosclerotic calcification in the aortic arch and proximal great vessels without significant stenosis. Lung apices are clear.  Right carotid system: Circumferential atherosclerotic calcification involving the proximal right internal carotid artery narrowing the lumen by approximately 50% diameter stenosis. Note is made of artifact through the bifurcation due to dental amalgam. No dissection. Right external carotid artery widely patent  Left carotid system: Left common carotid artery widely patent. Atherosclerotic calcification the carotid bulb with 25% diameter stenosis the lumen. Artifact through the carotid bulb due to dental amalgam. External carotid artery patent  Vertebral arteries:Calcific stenosis of the origin of the vertebral artery bilaterally. Circumferential atherosclerotic calcification and moderate stenosis of the distal vertebral artery bilaterally. Remainder of the vertebral arteries are widely patent.  Skeleton: Mild cervical degenerative change. No acute bony abnormality.  Other neck: Negative for mass or adenopathy in the neck.  CTA HEAD  Anterior circulation: Atherosclerotic calcification throughout the right cavernous carotid artery causing moderate stenosis. Right anterior and middle cerebral arteries are patent without stenosis  Atherosclerotic calcification throughout the left cavernous carotid with moderate stenosis. Left anterior and middle cerebral arteries are patent bilaterally without stenosis.  Posterior circulation: Moderate stenosis distal vertebral artery bilaterally due to calcific stenosis. PICA  patent bilaterally. Basilar is patent. Superior cerebellar and posterior cerebral arteries are patent without significant stenosis.  Venous sinuses: Patent  Anatomic variants: Negative for cerebral aneurysm.  Delayed phase: No enhancing lesions postcontrast administration  IMPRESSION: 50% diameter stenosis due to calcific plaque proximal right internal carotid artery.  25% stenosis due to calcific plaque proximal left internal carotid artery.  Moderate stenosis proximal and distal vertebral artery bilaterally due to calcified stenosis  Moderate stenosis cavernous carotid bilaterally due to calcified stenosis.   Electronically Signed   By: Franchot Gallo M.D.   On: 07/15/2014 17:56   Ct Angio Neck W/cm &/or Wo/cm  07/15/2014   CLINICAL DATA:  Nausea.  Dizziness and headache.  EXAM: CT ANGIOGRAPHY HEAD AND NECK  TECHNIQUE: Multidetector CT imaging of the head and neck was performed using the standard protocol during bolus administration of intravenous contrast. Multiplanar CT image reconstructions and MIPs were obtained to evaluate the vascular anatomy. Carotid stenosis measurements (when applicable) are obtained utilizing NASCET criteria, using the distal internal carotid diameter as the denominator.  CONTRAST:  157mL OMNIPAQUE IOHEXOL 350 MG/ML SOLN  COMPARISON:  CT head 07/14/2014  FINDINGS: CT HEAD  Brain: Unenhanced head CT performed yesterday was not repeated. Generalized atrophy. Negative for hemorrhage or mass. Mild chronic microvascular ischemic change in the white matter. No acute infarct.  Calvarium and skull base: Negative  Paranasal sinuses: Bony thickening and mucosal edema of the right maxillary sinus secondary to chronic sinusitis. No air-fluid level.  Orbits: Negative  CTA NECK  Aortic arch: Atherosclerotic calcification in the aortic arch and proximal great vessels without significant stenosis. Lung apices are clear.  Right carotid system: Circumferential atherosclerotic calcification involving the  proximal right internal carotid artery narrowing the lumen by approximately 50% diameter stenosis. Note is made of artifact through the bifurcation due to dental amalgam. No dissection. Right external carotid artery widely patent  Left carotid system: Left common carotid artery widely patent. Atherosclerotic calcification the carotid bulb with 25% diameter stenosis the lumen. Artifact through the carotid bulb due to dental amalgam. External carotid artery patent  Vertebral arteries:Calcific stenosis of the origin of the vertebral artery bilaterally. Circumferential atherosclerotic calcification and moderate stenosis of the distal vertebral artery bilaterally. Remainder of the vertebral arteries are widely patent.  Skeleton: Mild cervical degenerative change. No acute bony abnormality.  Other neck: Negative for mass or adenopathy in the neck.  CTA HEAD  Anterior circulation: Atherosclerotic calcification throughout the right cavernous carotid artery causing moderate stenosis. Right anterior and middle cerebral arteries are patent without stenosis  Atherosclerotic calcification throughout the left cavernous carotid with moderate stenosis. Left anterior and middle cerebral arteries are patent bilaterally without stenosis.  Posterior circulation: Moderate stenosis distal vertebral artery bilaterally due to calcific stenosis. PICA patent bilaterally. Basilar is patent. Superior cerebellar and posterior cerebral arteries are patent without significant stenosis.  Venous sinuses: Patent  Anatomic variants: Negative for cerebral aneurysm.  Delayed phase: No enhancing lesions postcontrast administration  IMPRESSION: 50% diameter stenosis due to calcific plaque proximal right internal carotid artery.  25% stenosis due to calcific plaque proximal left internal carotid artery.  Moderate stenosis proximal and distal vertebral artery bilaterally due to calcified stenosis  Moderate stenosis cavernous carotid bilaterally due to  calcified stenosis.   Electronically Signed   By: Franchot Gallo M.D.   On: 07/15/2014 17:56    EKG:   Orders placed or performed during the hospital encounter of 07/14/14  . EKG 12-Lead  . EKG 12-Lead  . EKG 12-Lead  . EKG 12-Lead    ASSESSMENT AND PLAN:   1. Chest pain with trending cardiac enzymes and abnormal repeat EKG- probably non-STEMI. Clinically stable. On heparin gtt for 2 days and nitroglycerin when necessary, will discontinue heparin drip On heparin gtt and nitroglycerin when necessary   continue aspirin and hold beta blocker. No statin because of allergy. s/p echo cardiogram with no fluid overload Transfer to telemetry  2. Syncope with superficial injury to head. Can be from cardiac arrhythmias /History of chronic vertigo imbalance  AICD interrogation with V. Fib as per cardiology report Currently in sinus rhythm, amiodarone dose is increased to 400 mg by mouth twice a day. Meclizine for vertigo Neurology consult -appreciate their recommendations CT of the head and C-spine negative for acute injury/bleed.  3. Nausea and vomiting with associated dizziness- probably from benign paroxysmal positional vertigo/differential diagnosis concussion syndrome  IV Zofran. [place] Neurology is following Will consider repeat CAT scan of the head if needed  4. History of coronary artery disease, status post CABG. series to cardiology as an outpatient 5. History of systolic congestive heart failure, compensated on medical management. With previous ejection fraction at 20% as of 2015 6. Rheumatoid arthritis, stable on oral prednisone. 7. History of V. tach status post AICD, stable. Telemetry monitoring. 8. Gait abnormality with balance problems, chronic. Plan: PT/OT consultation once patient is clinically stable  We will transfer him to telemetry   All the records are reviewed and case discussed with Care Management/Social Workerr. Management plans discussed with the patient,  family and they are in agreement.  CODE STATUS: Full code  TOTAL  TIME TAKING CARE OF THIS PATIENT: 35 minutes.  POSSIBLE D/C IN am DAYS, DEPENDING ON CLINICAL CONDITION.   Nicholes Mango M.D on 07/17/2014 at 12:19 PM  Between 7am to 6pm - Pager - [phone] After 6pm go to [email or web address] - password [email or web address] [facility]  Office  [phone]  CC: Primary care physician; [name], MD

## 2014-07-17 NOTE — Care Management (Signed)
Important Message  Patient Details  Name: [name] MRN: [id number] Date of Birth: May 06, 1940   Medicare Important Message Given:  Yes-second notification given    Juliann Pulse A Allmond 07/17/2014, 11:31 AM

## 2014-07-17 NOTE — Progress Notes (Signed)
ANTICOAGULATION CONSULT NOTE - Follow Up Consult  Pharmacy Consult for Heparin Indication: chest pain/ACS  Allergies  Allergen Reactions  . Nitroglycerin Other (See Comments)    "head ache, worse than a migraine"  . Pravastatin Other (See Comments)    Patient Measurements: Height: 6' (182.9 cm) Weight: 196 lb 9.6 oz (89.177 kg) IBW/kg (Calculated) : 77.6 Heparin Dosing Weight: 89.7 kg  Vital Signs: Temp: 98 F (36.7 C) (07/11 0014) Temp Source: Axillary (07/11 0014) BP: 93/39 mmHg (07/11 0400) Pulse Rate: 82 (07/11 0400)  Labs:  Recent Labs  07/14/14 2140 07/15/14 0644 07/15/14 0922 07/15/14 1732 07/16/14 0654 07/17/14 0437  HGB 12.8* 12.9*  --   --  11.8* 12.1*  HCT 38.9* 40.2  --   --  36.3* 37.3*  PLT 203 186  --   --  149* 149*  APTT 23*  --   --   --   --   --   LABPROT 14.3  --   --   --   --   --   INR 1.09  --   --   --   --   --   HEPARINUNFRC  --   --   --  0.53 0.54 0.50  CREATININE 1.34* 0.95 1.02  --   --  0.99  TROPONINI 0.03 0.25* 0.32* 0.55*  --   --     Estimated Creatinine Clearance: 71.9 mL/min (by C-G formula based on Cr of 0.99).   Medications:  Scheduled:  . amiodarone  400 mg Oral BID  . aspirin EC  81 mg Oral Daily  . carvedilol  3.125 mg Oral BID WC  . cholecalciferol  2,000 Units Oral Daily  . docusate sodium  100 mg Oral Daily  . feeding supplement (ENSURE ENLIVE)  237 mL Oral BID BM  . fluticasone  1 spray Each Nare Daily  . furosemide  40 mg Oral Daily  . loratadine  10 mg Oral Daily  . losartan  50 mg Oral Daily  . meclizine  25 mg Oral TID  . promethazine  12.5 mg Intravenous Once  . sodium chloride  3 mL Intravenous Q12H  . spironolactone  25 mg Oral Daily   Infusions:  . heparin 1,150 Units/hr (07/17/14 0027)   PRN: acetaminophen **OR** acetaminophen, HYDROcodone-acetaminophen, meclizine, nitroGLYCERIN, ondansetron (ZOFRAN) IV  Assessment: Patient is a 74 yo male transferred to ICU due to EKG changes suggesting  possible ischemia. Nitroglycerin drip initiated per cardiology. Pharmacy consulted to initiate and titrate heparin drip. Heparin drip infusing at 1150 units/hr.  Goal of Therapy:  Heparin level 0.3-0.7 units/ml Monitor platelets by anticoagulation protocol: Yes   Plan:  Will continue heparin drip at 1150 units/hr and f/u AM labs.   Ulice Dash D 07/17/2014,5:22 AM

## 2014-07-17 NOTE — Care Management Note (Signed)
Case Management Note  Patient Details  Name: [name] MRN: [id number] Date of Birth: [birth year]  Subjective/Objective:   RNCM consult for discharge planning. Attempted to meet with patient and he had visitors at bedside. TC to spouse, [name]. Patient lives at home with his spouse. He had a fall with superficial head injury. CT negative. Transferred to ICU following chest pain and elevated troponin. Has been on heparin gtt and nitro gtt. Plan is discontinue and transfer to telemetry. Spouse states prior to admission, patient did not require DME. 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 Current with PCP. Denies issues obtaining medications, copays or medical care. PT pending. No home health services in the past. Will follow progression             Action/Plan:   Expected Discharge Date:                  Expected Discharge Plan:     In-House Referral:     Discharge planning Services  CM Consult  Post Acute Care Choice:    Choice offered to:     DME Arranged:    DME Agency:     HH Arranged:    HH Agency:     Status of Service:  In process, will continue to follow  Medicare Important Message Given:  Yes-second notification given Date Medicare IM Given:    Medicare IM give by:    Date Additional Medicare IM Given:    Additional Medicare Important Message give by:     If discussed at [place] of Stay Meetings, dates discussed:    Additional Comments:  Jolly Mango, RN 07/17/2014, 1:28 PM

## 2014-07-17 NOTE — Progress Notes (Signed)
Per Dr. Margaretmary Eddy recheck BP in an hour and/or call if the patient is symptomatic. Hold beta blockers.

## 2014-07-17 NOTE — Progress Notes (Signed)
Initial Nutrition Assessment    INTERVENTION:    Medical Food Supplement Therapy: pt does not like Ensure, not drinking. Discussed options for other nutritional supplements and pt would like to try [employer]; will send TID, regular at breakfast, as milkshake at lunch and dinner.  Meals and Snacks: Cater to patient preferences    NUTRITION DIAGNOSIS:  Inadequate oral intake related to  (poor appetite) as evidenced by per patient/family report.  GOAL:  Patient will meet greater than or equal to 90% of their needs   MONITOR:   (Energy  Intake, Anthropometrics, Digestive System, Electrolyte/Renal Profile, Glucose Profile)  REASON FOR ASSESSMENT:  Consult Poor PO  ASSESSMENT:  Pt admitted with chest pain, falls, emesis  PMHx:  Past Medical History  Diagnosis Date  . Leg weakness   . Balance problem   . Arthritis   . Sinus headache   . High cholesterol   . Collagen vascular disease     RA since 2014  . CHF (congestive heart failure)   . Coronary artery disease   . Myocardial infarction   . AICD (automatic cardioverter/defibrillator) present   . Dysrhythmia     Diet Order: Heart Healthy  Current Nutrition: Pt ate jello at lunch today, nothing else. Wife brought in fish sandwich and fries yesterday and pt took bites only  Food/Nutrition-Related History: pt reports poor appetite, no taste for food   Medications: lasix, potassium choride, colace, Vitamin D,   Electrolyte/Renal Profile and Glucose Profile:   Recent Labs Lab 07/15/14 0644 07/15/14 0922 07/15/14 1048 07/17/14 0437  NA 135 137  --  137  K 5.8* 4.5 4.4 3.1*  CL 104 102  --  101  CO2 21* 25  --  30  BUN 23* 22*  --  17  CREATININE 0.95 1.02  --  0.99  CALCIUM 8.3* 8.2*  --  8.0*  MG  --   --   --  1.9  GLUCOSE 129* 121*  --  97   Protein Profile:   Recent Labs Lab 07/14/14 2140 07/15/14 0644 07/15/14 0922  ALBUMIN 3.0* 3.0* 2.9*    Gastrointestinal Profile: no  N/V at present, pt mentioned something about thrush prior to admission, took [employer] mouthwash and reported this helped for awhile; pt unable to describe feeling in mouth but no pain, just reports that he thinks he is hungry but as soon as he starts to eat something he has no appetite Last BM: 7/9   Nutrition-Focused Physical Exam Findings: Nutrition-Focused physical exam completed. Findings are WDL for fat depletion, muscle depletion, and edema.    Weight Change: Pt reports 18 pound wt loss in 1 month; 6.4% wt loss. Noted per documented weight, pt with 5.4% wt loss in January.  Anthropometrics:  Height:  Ht Readings from Last 1 Encounters:  07/14/14 6' (1.829 m)    Weight:  Wt Readings from Last 1 Encounters:  07/17/14 194 lb 14.2 oz (88.4 kg)    Ideal Body Weight:     Wt Readings from Last 10 Encounters:  07/17/14 194 lb 14.2 oz (88.4 kg)  01/25/14 205 lb (92.987 kg)  12/13/13 200 lb (90.719 kg)    BMI:  Body mass index is 26.43 kg/(m^2).  Estimated Nutritional Needs:  Kcal:  8502-7741 kcals (BEE 1657, 1.3 AF, 1.0-1.2 IF)   Protein:  88-106 g (1.0-1.2 g/kg)   Fluid:  2200-2640 mL (25-30 ml/kg)  Skin:  Reviewed, no issues  Diet Order:  Diet Heart Room service appropriate?:  Yes; Fluid consistency:: Thin     Intake/Output Summary (Last 24 hours) at 07/17/14 1419 Last data filed at 07/17/14 1345  Gross per 24 hour  Intake 250.13 ml  Output   1225 ml  Net -974.87 ml    MODERATE Care Level  Kerman Passey MS, RD, LDN [phone] Pager

## 2014-07-17 NOTE — Progress Notes (Signed)
Pt has not CO any chest pain today. AOx4, uses urinal. BP continues to be soft but stable, held all meds affecting pressures today per Dr. Margaretmary Eddy. Pt reports taking Lasix every other night at home instead of daily. Dr. Margaretmary Eddy was notified and said she would change the med rec. PT also got pt into the chair today, tolerated well.

## 2014-07-17 NOTE — Progress Notes (Signed)
  [facility] NOTE  Pharmacy Consult for Electrolyte Management  Indication: ICU Status   Allergies  Allergen Reactions  . Nitroglycerin Other (See Comments)    "head ache, worse than a migraine"  . Pravastatin Other (See Comments)    Patient Measurements: Height: 6' (182.9 cm) Weight: 194 lb 14.2 oz (88.4 kg) IBW/kg (Calculated) : 77.6  Vital Signs: Temp: 97.7 F (36.5 C) (07/11 1300) Temp Source: Oral (07/11 1300) BP: 106/59 mmHg (07/11 1430) Pulse Rate: 90 (07/11 1430) Intake/Output from previous day: 07/10 0701 - 07/11 0700 In: 241.5 [I.V.:241.5] Out: 1725 [Urine:1725] Intake/Output from this shift: Total I/O In: 89.1 [I.V.:89.1] Out: 450 [Urine:450] Vent settings for last 24 hours:    Labs:  Recent Labs  07/14/14 2140 07/15/14 0644 07/15/14 0922 07/16/14 0654 07/17/14 0437  WBC 11.9* 10.3  --  7.9 7.8  HGB 12.8* 12.9*  --  11.8* 12.1*  HCT 38.9* 40.2  --  36.3* 37.3*  PLT 203 186  --  149* 149*  APTT 23*  --   --   --   --   INR 1.09  --   --   --   --   CREATININE 1.34* 0.95 1.02  --  0.99  MG  --   --   --   --  1.9  ALBUMIN 3.0* 3.0* 2.9*  --   --   PROT 6.8 6.7 6.3*  --   --   AST 140* 132* 124*  --   --   ALT 129* 122* 116*  --   --   ALKPHOS 63 62 58  --   --   BILITOT 0.6 1.0 0.4  --   --    Estimated Creatinine Clearance: 71.9 mL/min (by C-G formula based on Cr of 0.99).   Recent Labs  07/15/14 0855  GLUCAP 130*    Microbiology: No results found for this or any previous visit (from the past 720 hour(s)).  Medications:  Scheduled:  . amiodarone  400 mg Oral BID  . aspirin EC  81 mg Oral Daily  . carvedilol  3.125 mg Oral BID WC  . cholecalciferol  2,000 Units Oral Daily  . docusate sodium  100 mg Oral Daily  . fluticasone  1 spray Each Nare Daily  . [START ON 07/19/2014] furosemide  40 mg Oral Q48H  . loratadine  10 mg Oral Daily  . losartan  50 mg Oral Daily  . meclizine  25 mg Oral TID  .  promethazine  12.5 mg Intravenous Once  . sodium chloride  3 mL Intravenous Q12H  . spironolactone  25 mg Oral Daily   Infusions:    Assessment: 74 yo male ICU patient.   Plan:  Electrolytes: Will order potassium 32mEq PO x 1. Will recheck electrolytes with am labs.    Pharmacy will continue to monitor and adjust per consult.    Simpson,Michael L 07/17/2014,3:32 PM

## 2014-07-18 LAB — BASIC METABOLIC PANEL
Anion gap: 8 (ref 5–15)
BUN: 14 mg/dL (ref 6–20)
CO2: 26 mmol/L (ref 22–32)
Calcium: 8.5 mg/dL — ABNORMAL LOW (ref 8.9–10.3)
Chloride: 104 mmol/L (ref 101–111)
Creatinine, Ser: 0.85 mg/dL (ref 0.61–1.24)
GFR calc Af Amer: >60 mL/min (ref >60)
GFR calc non Af Amer: >60 mL/min (ref >60)
GLUCOSE: 107 mg/dL — AB (ref 65–99)
Potassium: 3.7 mmol/L (ref 3.5–5.1)
Sodium: 138 mmol/L (ref 135–145)

## 2014-07-18 LAB — CBC WITH DIFFERENTIAL/PLATELET
BASOS ABS: 0.1 10*3/uL (ref 0–0.1)
Basophils Relative: 1 %
Eosinophils Absolute: 0.2 10*3/uL (ref 0–0.7)
Eosinophils Relative: 3 %
HCT: 37.8 % — ABNORMAL LOW (ref 40.0–52.0)
Hemoglobin: 12.3 g/dL — ABNORMAL LOW (ref 13.0–18.0)
Lymphocytes Relative: 20 %
Lymphs Abs: 1.5 10*3/uL (ref 1.0–3.6)
MCH: 29.6 pg (ref 26.0–34.0)
MCHC: 32.4 g/dL (ref 32.0–36.0)
MCV: 91.3 fL (ref 80.0–100.0)
MONO ABS: 1.4 10*3/uL — AB (ref 0.2–1.0)
Monocytes Relative: 19 %
Neutro Abs: 4.3 10*3/uL (ref 1.4–6.5)
Neutrophils Relative %: 57 %
Platelets: 152 10*3/uL (ref 150–440)
RBC: 4.14 MIL/uL — AB (ref 4.40–5.90)
RDW: 14.1 % (ref 11.5–14.5)
WBC: 7.5 10*3/uL (ref 3.8–10.6)

## 2014-07-18 LAB — MAGNESIUM: Magnesium: 1.9 mg/dL (ref 1.7–2.4)

## 2014-07-18 MED ORDER — LOSARTAN POTASSIUM 50 MG PO TABS: 25.0000 mg | ORAL_TABLET | Freq: Every day | ORAL | Status: DC

## 2014-07-18 MED ORDER — SPIRONOLACTONE 25 MG PO TABS: 25.0000 mg | ORAL_TABLET | Freq: Every day | ORAL | Status: DC

## 2014-07-18 MED ORDER — METOPROLOL SUCCINATE ER 25 MG PO TB24: 25.0000 mg | ORAL_TABLET | Freq: Every day | ORAL | Status: DC

## 2014-07-18 MED ORDER — MECLIZINE HCL 25 MG PO TABS: 25.0000 mg | ORAL_TABLET | Freq: Two times a day (BID) | ORAL | Status: DC | PRN

## 2014-07-18 MED ORDER — MECLIZINE HCL 25 MG PO TABS: 25.0000 mg | ORAL_TABLET | Freq: Three times a day (TID) | ORAL | Status: DC

## 2014-07-18 MED ORDER — AMIODARONE HCL 200 MG PO TABS
200.0000 mg | ORAL_TABLET | Freq: Two times a day (BID) | ORAL | Status: DC
  Administered 2014-07-18: 200 mg via ORAL
  Filled 2014-07-18: qty 1

## 2014-07-18 MED ORDER — AMIODARONE HCL 200 MG PO TABS: 400.0000 mg | ORAL_TABLET | Freq: Every day | ORAL | Status: DC

## 2014-07-18 MED ORDER — ACETAMINOPHEN 325 MG PO TABS: 650.0000 mg | ORAL_TABLET | Freq: Four times a day (QID) | ORAL | Status: DC | PRN

## 2014-07-18 NOTE — Discharge Instructions (Signed)
Follow-up with [name], electrophysiologist at [facility] as scheduled tomorrow Follow-up with primary care physician in a week Follow-up with ENT in a week Activity as tolerated-no strenuous work until seen by cardiology tomorrow Outpatient physical therapy Diet-cardiac

## 2014-07-18 NOTE — Progress Notes (Signed)
Per Dr. Margaretmary Eddy spoke with pt abt checking BP prior to taking BP meds. She is setting parameters at DC and will further educate the pt then.

## 2014-07-18 NOTE — Discharge Summary (Signed)
[facility] [place] NAME: [name]    MR#:  [id number]  DATE OF BIRTH:  Jan 13, 1940  DATE OF ADMISSION:  07/14/2014 ADMITTING PHYSICIAN: [name], MD  DATE OF DISCHARGE: 7/12/6 PRIMARY CARE PHYSICIAN: [name], MD    ADMISSION DIAGNOSIS:  Fall, initial encounter [W19.XXXA] Head injury, initial encounter [S09.90XA] Chest pain, unspecified chest pain type [R07.9] Non-intractable vomiting with nausea, vomiting of unspecified type [R11.2]  DISCHARGE DIAGNOSIS:  Principal Problem:   Chest pain secondary to non-STEMI Syncope-cardiac-secondary to V. Fib Vertigo probably from benign paroxysmal positional vertigo  Active Problems:   CAD (coronary artery disease)   Fall at home   Nausea and vomiting   Nausea & vomiting   SECONDARY DIAGNOSIS:   Past Medical History  Diagnosis Date  . Leg weakness   . Balance problem   . Arthritis   . Sinus headache   . High cholesterol   . Collagen vascular disease     RA since 2014  . CHF (congestive heart failure)   . Coronary artery disease   . Myocardial infarction   . AICD (automatic cardioverter/defibrillator) present   . Dysrhythmia     HOSPITAL COURSE:  1. Chest pain with trending cardiac enzymes and abnormal repeat EKG- probably non-STEMI.  Given heparin gtt for 48 hours and discontinued and nitroglycerin when necessary cardiology Dr. Ubaldo Glassing , appreciate his recommendations continue aspirin and beta blocker. No statin because of allergy. s/p echo cardiogram with no fluid overload Cardiology is recommending Toprol-XL with holding parameters in view of his ischemic cardiomyopathy. Discontinued Coreg Outpatient follow-up with to cardiology [name] as scheduled tomorrow  2. Syncope with superficial injury to head. Can be from cardiac arrhythmias /History of chronic vertigo imbalance  AICD interrogation revealed V. Fib as per cardiology report Currently in  sinus rhythm, amiodarone dose is increased to 400 mg by mouth twice a day, but now it has been changed to 400 mg by mouth once daily Meclizine for vertigo. Outpatient follow-up with ENT in a week is recommended Neurology consult -appreciate their recommendations, no further workup needed CT of the head and C-spine negative for acute injury/bleed.  3. Nausea and vomiting with associated dizziness- probably from benign paroxysmal positional vertigo IV Zofran given. Currently denies nausea vomiting Antivert  4. History of coronary artery disease, status post CABG. series to cardiology as an outpatient Discontinued Coreg in view of soft blood pressure. Patient is recommended to monitor his blood pressure closely 5. History of systolic congestive heart failure, compensated on medical management. With previous ejection fraction at 20% as of 2015-continue spironolactone and Lasix every 48 hours 6. Rheumatoid arthritis, stable on oral prednisone. 7. History of V. tach status post AICD, stable. Amiodarone. Outpatient follow-up with cardiology in a.m. at [facility]. 8. Gait abnormality with balance problems, chronic. Plan: PT/OT recommending outpatient physical therapy   DISCHARGE CONDITIONS:  Satisfactory  CONSULTS OBTAINED:  Treatment Team:  [name], MD [name], MD   PROCEDURES none  DRUG ALLERGIES:   Allergies  Allergen Reactions  . Nitroglycerin Other (See Comments)    "head ache, worse than a migraine"  . Pravastatin Other (See Comments)    DISCHARGE MEDICATIONS:   Current Discharge Medication List    START taking these medications   Details  !! meclizine (ANTIVERT) 25 MG tablet Take 1 tablet (25 mg total) by mouth 3 (three) times daily. Qty: 30 tablet, Refills: 0    !! meclizine (  ANTIVERT) 25 MG tablet Take 1 tablet (25 mg total) by mouth 2 (two) times daily as needed for dizziness. Qty: 30 tablet, Refills: 0    metoprolol succinate (TOPROL-XL) 25 MG 24 hr  tablet Take 1 tablet (25 mg total) by mouth daily. Qty: 30 tablet, Refills: 0     !! - Potential duplicate medications found. Please discuss with provider.    CONTINUE these medications which have CHANGED   Details  acetaminophen (TYLENOL) 325 MG tablet Take 2 tablets (650 mg total) by mouth every 6 (six) hours as needed for mild pain (or Fever >/= 101).    amiodarone (PACERONE) 200 MG tablet Take 2 tablets (400 mg total) by mouth daily. Qty: 30 tablet, Refills: 0    spironolactone (ALDACTONE) 25 MG tablet Take 1 tablet (25 mg total) by mouth daily. Qty: 30 tablet, Refills: 0      CONTINUE these medications which have NOT CHANGED   Details  predniSONE (DELTASONE) 5 MG tablet Take 5 mg by mouth daily with breakfast. Take 1-2 tablets with food or milk daily.    traMADol (ULTRAM) 50 MG tablet Take 50 mg by mouth every 6 (six) hours as needed (pain).    aspirin EC 81 MG tablet Take 81 mg by mouth daily.    cetirizine (ZYRTEC) 10 MG tablet Take 10 mg by mouth daily.    Cholecalciferol (VITAMIN D3) 2000 UNITS TABS Take by mouth daily.    Docusate Sodium (COLACE PO) Take by mouth daily.    fluticasone (FLONASE) 50 MCG/ACT nasal spray Place into both nostrils daily.    furosemide (LASIX) 40 MG tablet Take 40 mg by mouth as needed.     nitroGLYCERIN (NITROSTAT) 0.4 MG SL tablet Place 0.4 mg under the tongue every 5 (five) minutes as needed for chest pain.      STOP taking these medications     carvedilol (COREG) 3.125 MG tablet      losartan (COZAAR) 50 MG tablet          DISCHARGE INSTRUCTIONS:   Follow-up with the primary care physician in a week Follow-up with her to cardiology [name] at [facility] as scheduled tomorrow Follow-up with ENT in a week Outpatient cardiac rehabilitation in a week Outpatient physical therapy No strenuous work until cleared by cardiology in a.m. Please use a rolling walker  DIET:  Cardiac  DISCHARGE CONDITION:   Satisfactory  ACTIVITY:  Activity as tolerated, no strenuous work until cleared by cardiology  OXYGEN:  Home Oxygen: No.   Oxygen Delivery: room air  DISCHARGE LOCATION:  {Home with outpatient PT If you experience worsening of your admission symptoms, develop shortness of breath, life threatening emergency, suicidal or homicidal thoughts you must seek medical attention immediately by calling 911 or calling your MD immediately  if symptoms less severe.  You Must read complete instructions/literature along with all the possible adverse reactions/side effects for all the Medicines you take and that have been prescribed to you. Take any new Medicines after you have completely understood and accpet all the possible adverse reactions/side effects.   Please note  You were cared for by a hospitalist during your hospital stay. If you have any questions about your discharge medications or the care you received while you were in the hospital after you are discharged, you can call the unit and asked to speak with the hospitalist on call if the hospitalist that took care of you is not available. Once you are discharged, your primary care physician  will handle any further medical issues. Please note that NO REFILLS for any discharge medications will be authorized once you are discharged, as it is imperative that you return to your primary care physician (or establish a relationship with a primary care physician if you do not have one) for your aftercare needs so that they can reassess your need for medications and monitor your lab values.     Today  Chief Complaint  Patient presents with  . Chest Pain  . Fall  . Emesis   Patient is resting comfortably. Denies any chest pain or shortness of breath. Reports vertigo with positional change which is chronic  ROS:  CONSTITUTIONAL: Denies fevers, chills. Denies any fatigue, weakness.  EYES: Denies blurry vision, double vision, eye pain. EARS, NOSE,  THROAT: Denies tinnitus, ear pain, hearing loss. RESPIRATORY: Denies cough, wheeze, shortness of breath.  CARDIOVASCULAR: Denies chest pain, palpitations, edema.  GASTROINTESTINAL: Denies nausea, vomiting, diarrhea, abdominal pain. Denies bright red blood per rectum. GENITOURINARY: Denies dysuria, hematuria. ENDOCRINE: Denies nocturia or thyroid problems. HEMATOLOGIC AND LYMPHATIC: Denies easy bruising or bleeding. SKIN: Denies rash or lesion. MUSCULOSKELETAL: Denies pain in neck, back, shoulder, knees, hips or arthritic symptoms.  NEUROLOGIC: Denies paralysis, paresthesias.  PSYCHIATRIC: Denies anxiety or depressive symptoms.   VITAL SIGNS:  Blood pressure 101/57, pulse 76, temperature 97.9 F (36.6 C), temperature source Oral, resp. rate 16, height 6' (1.829 m), weight 87.8 kg (193 lb 9 oz), SpO2 93 %.  I/O:   Intake/Output Summary (Last 24 hours) at 07/18/14 0955 Last data filed at 07/18/14 0600  Gross per 24 hour  Intake  89.13 ml  Output   1920 ml  Net -1830.87 ml    PHYSICAL EXAMINATION:  GENERAL:  74 y.o.-year-old patient lying in the bed with no acute distress.  EYES: Pupils equal, round, reactive to light and accommodation. No scleral icterus. Extraocular muscles intact.  HEENT: Head atraumatic, normocephalic. Oropharynx and nasopharynx clear.  NECK:  Supple, no jugular venous distention. No thyroid enlargement, no tenderness.  LUNGS: Normal breath sounds bilaterally, no wheezing, rales,rhonchi or crepitation. No use of accessory muscles of respiration.  CARDIOVASCULAR: S1, S2 normal. No murmurs, rubs, or gallops.  ABDOMEN: Soft, non-tender, non-distended. Bowel sounds present. No organomegaly or mass.  EXTREMITIES: No pedal edema, cyanosis, or clubbing.  NEUROLOGIC: Cranial nerves II through XII are intact. Muscle strength 5/5 in all extremities. Sensation intact. Gait not checked.  PSYCHIATRIC: The patient is alert and oriented x 3.  SKIN: No obvious rash, lesion, or  ulcer.   DATA REVIEW:   CBC  Recent Labs Lab 07/18/14 0615  WBC 7.5  HGB 12.3*  HCT 37.8*  PLT 152    Chemistries   Recent Labs Lab 07/15/14 0922  07/18/14 0615  NA 137  < > 138  K 4.5  < > 3.7  CL 102  < > 104  CO2 25  < > 26  GLUCOSE 121*  < > 107*  BUN 22*  < > 14  CREATININE 1.02  < > 0.85  CALCIUM 8.2*  < > 8.5*  MG  --   < > 1.9  AST 124*  --   --   ALT 116*  --   --   ALKPHOS 58  --   --   BILITOT 0.4  --   --   < > = values in this interval not displayed.  Cardiac Enzymes  Recent Labs Lab 07/15/14 1732  TROPONINI 0.55*    Microbiology Results  No results found for this or any previous visit.  RADIOLOGY:  Ct Angio Head W/cm &/or Wo Cm  07/15/2014   CLINICAL DATA:  Nausea.  Dizziness and headache.  EXAM: CT ANGIOGRAPHY HEAD AND NECK  TECHNIQUE: Multidetector CT imaging of the head and neck was performed using the standard protocol during bolus administration of intravenous contrast. Multiplanar CT image reconstructions and MIPs were obtained to evaluate the vascular anatomy. Carotid stenosis measurements (when applicable) are obtained utilizing NASCET criteria, using the distal internal carotid diameter as the denominator.  CONTRAST:  162mL OMNIPAQUE IOHEXOL 350 MG/ML SOLN  COMPARISON:  CT head 07/14/2014  FINDINGS: CT HEAD  Brain: Unenhanced head CT performed yesterday was not repeated. Generalized atrophy. Negative for hemorrhage or mass. Mild chronic microvascular ischemic change in the white matter. No acute infarct.  Calvarium and skull base: Negative  Paranasal sinuses: Bony thickening and mucosal edema of the right maxillary sinus secondary to chronic sinusitis. No air-fluid level.  Orbits: Negative  CTA NECK  Aortic arch: Atherosclerotic calcification in the aortic arch and proximal great vessels without significant stenosis. Lung apices are clear.  Right carotid system: Circumferential atherosclerotic calcification involving the proximal right internal  carotid artery narrowing the lumen by approximately 50% diameter stenosis. Note is made of artifact through the bifurcation due to dental amalgam. No dissection. Right external carotid artery widely patent  Left carotid system: Left common carotid artery widely patent. Atherosclerotic calcification the carotid bulb with 25% diameter stenosis the lumen. Artifact through the carotid bulb due to dental amalgam. External carotid artery patent  Vertebral arteries:Calcific stenosis of the origin of the vertebral artery bilaterally. Circumferential atherosclerotic calcification and moderate stenosis of the distal vertebral artery bilaterally. Remainder of the vertebral arteries are widely patent.  Skeleton: Mild cervical degenerative change. No acute bony abnormality.  Other neck: Negative for mass or adenopathy in the neck.  CTA HEAD  Anterior circulation: Atherosclerotic calcification throughout the right cavernous carotid artery causing moderate stenosis. Right anterior and middle cerebral arteries are patent without stenosis  Atherosclerotic calcification throughout the left cavernous carotid with moderate stenosis. Left anterior and middle cerebral arteries are patent bilaterally without stenosis.  Posterior circulation: Moderate stenosis distal vertebral artery bilaterally due to calcific stenosis. PICA patent bilaterally. Basilar is patent. Superior cerebellar and posterior cerebral arteries are patent without significant stenosis.  Venous sinuses: Patent  Anatomic variants: Negative for cerebral aneurysm.  Delayed phase: No enhancing lesions postcontrast administration  IMPRESSION: 50% diameter stenosis due to calcific plaque proximal right internal carotid artery.  25% stenosis due to calcific plaque proximal left internal carotid artery.  Moderate stenosis proximal and distal vertebral artery bilaterally due to calcified stenosis  Moderate stenosis cavernous carotid bilaterally due to calcified stenosis.    Electronically Signed   By: Franchot Gallo M.D.   On: 07/15/2014 17:56   Ct Head Wo Contrast  07/14/2014   CLINICAL DATA:  Status post fall in bathroom, with vomiting. Patient does not remember fall. Concern for cervical spine injury. Initial encounter.  EXAM: CT HEAD WITHOUT CONTRAST  CT CERVICAL SPINE WITHOUT CONTRAST  TECHNIQUE: Multidetector CT imaging of the head and cervical spine was performed following the standard protocol without intravenous contrast. Multiplanar CT image reconstructions of the cervical spine were also generated.  COMPARISON:  CT of the cervical spine performed 01/10/2014  FINDINGS: CT HEAD FINDINGS  There is no evidence of acute infarction, mass lesion, or intra- or extra-axial hemorrhage on CT.  Prominence of the ventricles and sulci  reflects mild cortical volume loss. Mild cerebellar atrophy is noted. Scattered periventricular white matter change likely reflects small vessel ischemic microangiopathy.  The brainstem and fourth ventricle are within normal limits. The basal ganglia are unremarkable in appearance. The cerebral hemispheres demonstrate grossly normal gray-white differentiation. No mass effect or midline shift is seen.  There is no evidence of fracture; visualized osseous structures are unremarkable in appearance. The visualized portions of the orbits are within normal limits. There is minimal partial opacification of the right mastoid air cells. Mild inspissated mucus is noted at the base of the right maxillary sinus. The remaining paranasal sinuses and left mastoid air cells are well-aerated. No significant soft tissue abnormalities are seen.  CT CERVICAL SPINE FINDINGS  There is no evidence of fracture or subluxation. Vertebral bodies demonstrate normal height and alignment. Mild disc space narrowing is noted at C4-C5 and at C6-C7, with small anterior and posterior disc osteophyte complexes. Prevertebral soft tissues are within normal limits.  The thyroid gland is  unremarkable in appearance. The visualized lung apices are clear. Calcification is noted about the carotid bifurcations bilaterally.  IMPRESSION: 1. No evidence of traumatic intracranial injury or fracture. 2. No evidence of fracture or subluxation along the cervical spine. 3. Mild cortical volume loss and scattered small vessel ischemic microangiopathy. 4. Minimal partial opacification of the right mastoid air cells, and mild inspissated mucus at the base of the right maxillary sinus. 5. Calcification about the carotid bifurcations bilaterally. Carotid ultrasound would be helpful for further evaluation, when and as deemed clinically appropriate.   Electronically Signed   By: Garald Balding M.D.   On: 07/14/2014 23:41   Ct Angio Neck W/cm &/or Wo/cm  07/15/2014   CLINICAL DATA:  Nausea.  Dizziness and headache.  EXAM: CT ANGIOGRAPHY HEAD AND NECK  TECHNIQUE: Multidetector CT imaging of the head and neck was performed using the standard protocol during bolus administration of intravenous contrast. Multiplanar CT image reconstructions and MIPs were obtained to evaluate the vascular anatomy. Carotid stenosis measurements (when applicable) are obtained utilizing NASCET criteria, using the distal internal carotid diameter as the denominator.  CONTRAST:  147mL OMNIPAQUE IOHEXOL 350 MG/ML SOLN  COMPARISON:  CT head 07/14/2014  FINDINGS: CT HEAD  Brain: Unenhanced head CT performed yesterday was not repeated. Generalized atrophy. Negative for hemorrhage or mass. Mild chronic microvascular ischemic change in the white matter. No acute infarct.  Calvarium and skull base: Negative  Paranasal sinuses: Bony thickening and mucosal edema of the right maxillary sinus secondary to chronic sinusitis. No air-fluid level.  Orbits: Negative  CTA NECK  Aortic arch: Atherosclerotic calcification in the aortic arch and proximal great vessels without significant stenosis. Lung apices are clear.  Right carotid system: Circumferential  atherosclerotic calcification involving the proximal right internal carotid artery narrowing the lumen by approximately 50% diameter stenosis. Note is made of artifact through the bifurcation due to dental amalgam. No dissection. Right external carotid artery widely patent  Left carotid system: Left common carotid artery widely patent. Atherosclerotic calcification the carotid bulb with 25% diameter stenosis the lumen. Artifact through the carotid bulb due to dental amalgam. External carotid artery patent  Vertebral arteries:Calcific stenosis of the origin of the vertebral artery bilaterally. Circumferential atherosclerotic calcification and moderate stenosis of the distal vertebral artery bilaterally. Remainder of the vertebral arteries are widely patent.  Skeleton: Mild cervical degenerative change. No acute bony abnormality.  Other neck: Negative for mass or adenopathy in the neck.  CTA HEAD  Anterior circulation: Atherosclerotic  calcification throughout the right cavernous carotid artery causing moderate stenosis. Right anterior and middle cerebral arteries are patent without stenosis  Atherosclerotic calcification throughout the left cavernous carotid with moderate stenosis. Left anterior and middle cerebral arteries are patent bilaterally without stenosis.  Posterior circulation: Moderate stenosis distal vertebral artery bilaterally due to calcific stenosis. PICA patent bilaterally. Basilar is patent. Superior cerebellar and posterior cerebral arteries are patent without significant stenosis.  Venous sinuses: Patent  Anatomic variants: Negative for cerebral aneurysm.  Delayed phase: No enhancing lesions postcontrast administration  IMPRESSION: 50% diameter stenosis due to calcific plaque proximal right internal carotid artery.  25% stenosis due to calcific plaque proximal left internal carotid artery.  Moderate stenosis proximal and distal vertebral artery bilaterally due to calcified stenosis  Moderate  stenosis cavernous carotid bilaterally due to calcified stenosis.   Electronically Signed   By: Franchot Gallo M.D.   On: 07/15/2014 17:56   Ct Cervical Spine Wo Contrast  07/14/2014   CLINICAL DATA:  Status post fall in bathroom, with vomiting. Patient does not remember fall. Concern for cervical spine injury. Initial encounter.  EXAM: CT HEAD WITHOUT CONTRAST  CT CERVICAL SPINE WITHOUT CONTRAST  TECHNIQUE: Multidetector CT imaging of the head and cervical spine was performed following the standard protocol without intravenous contrast. Multiplanar CT image reconstructions of the cervical spine were also generated.  COMPARISON:  CT of the cervical spine performed 01/10/2014  FINDINGS: CT HEAD FINDINGS  There is no evidence of acute infarction, mass lesion, or intra- or extra-axial hemorrhage on CT.  Prominence of the ventricles and sulci reflects mild cortical volume loss. Mild cerebellar atrophy is noted. Scattered periventricular white matter change likely reflects small vessel ischemic microangiopathy.  The brainstem and fourth ventricle are within normal limits. The basal ganglia are unremarkable in appearance. The cerebral hemispheres demonstrate grossly normal gray-white differentiation. No mass effect or midline shift is seen.  There is no evidence of fracture; visualized osseous structures are unremarkable in appearance. The visualized portions of the orbits are within normal limits. There is minimal partial opacification of the right mastoid air cells. Mild inspissated mucus is noted at the base of the right maxillary sinus. The remaining paranasal sinuses and left mastoid air cells are well-aerated. No significant soft tissue abnormalities are seen.  CT CERVICAL SPINE FINDINGS  There is no evidence of fracture or subluxation. Vertebral bodies demonstrate normal height and alignment. Mild disc space narrowing is noted at C4-C5 and at C6-C7, with small anterior and posterior disc osteophyte complexes.  Prevertebral soft tissues are within normal limits.  The thyroid gland is unremarkable in appearance. The visualized lung apices are clear. Calcification is noted about the carotid bifurcations bilaterally.  IMPRESSION: 1. No evidence of traumatic intracranial injury or fracture. 2. No evidence of fracture or subluxation along the cervical spine. 3. Mild cortical volume loss and scattered small vessel ischemic microangiopathy. 4. Minimal partial opacification of the right mastoid air cells, and mild inspissated mucus at the base of the right maxillary sinus. 5. Calcification about the carotid bifurcations bilaterally. Carotid ultrasound would be helpful for further evaluation, when and as deemed clinically appropriate.   Electronically Signed   By: Garald Balding M.D.   On: 07/14/2014 23:41   Dg Chest Portable 1 View  07/14/2014   CLINICAL DATA:  Chest pain  EXAM: PORTABLE CHEST - 1 VIEW  COMPARISON:  05/30/2014  FINDINGS: Stable positioning of single chamber pacer/ ICD into the right ventricle. Stable heart size and mediastinal contours  post CABG. There is no edema, consolidation, effusion, or pneumothorax. Remote and healed posterior right rib fractures.  IMPRESSION: No evidence of active disease   Electronically Signed   By: Monte Fantasia M.D.   On: 07/14/2014 21:53   Ct Angio Chest Aorta W/cm &/or Wo/cm  07/14/2014   CLINICAL DATA:  Severe chest pain  EXAM: CT ANGIOGRAPHY CHEST WITH CONTRAST  TECHNIQUE: Multidetector CT imaging of the chest was performed using the standard protocol during bolus administration of intravenous contrast. Multiplanar CT image reconstructions and MIPs were obtained to evaluate the vascular anatomy.  CONTRAST:  174mL OMNIPAQUE IOHEXOL 350 MG/ML SOLN  COMPARISON:  None.  FINDINGS: THORACIC INLET/BODY WALL:  Single chamber ICD/pacer from the left is in unremarkable position, tip at the right ventricular apex.  MEDIASTINUM:  Normal heart size. No pericardial effusion. No acute  vascular abnormality, including aortic dissection or intramural hematoma. No central pulmonary embolism. Extensive atherosclerosis, including the coronary arteries. The patient is status post CABG. The LIMA is well opacified. The saphenous vein grafts have questionable patency with no visible opacification.  LUNG WINDOWS:  Subsegmental bibasilar atelectasis. No consolidation. No effusion. No suspicious pulmonary nodule.  UPPER ABDOMEN:  Dense liver, correlate with patient's amiodarone use. Cholelithiasis without acute cholecystitis.  OSSEOUS:  Remote and healed posterior right rib fractures.  Review of the MIP images confirms the above findings.  IMPRESSION: 1. No acute aortic finding or central pulmonary embolism. 2. Extensive atherosclerosis, status post CABG. The LIMA is well opacified; doubtful patency of the saphenous vein grafts. 3. Bibasilar atelectasis. 4. Cholelithiasis   Electronically Signed   By: Monte Fantasia M.D.   On: 07/14/2014 23:52    EKG:   Orders placed or performed during the hospital encounter of 07/14/14  . EKG 12-Lead  . EKG 12-Lead  . EKG 12-Lead  . EKG 12-Lead      Management plans discussed with the patient, family and they are in agreement.  CODE STATUS:     Code Status Orders        Start     Ordered   07/15/14 0327  Full code   Continuous     07/15/14 0326      TOTAL TIME TAKING CARE OF THIS PATIENT: 45 minutes.    @[facility] @  on 07/18/2014 at 9:55 AM  Between 7am to 6pm - Pager - [phone]  After 6pm go to [email or web address] - password [email or web address] [facility]  Office  [phone]  CC: Primary care physician; [name], MD

## 2014-07-18 NOTE — Discharge Summary (Signed)
Reviewed discharge instructions with pt. Advised by Jolly Mango to have pt. Get referral for outpatient rehab made by [name] tomorrow on 07/19/14. This was also explained to the pt. The parameters for taking BP meds were reviewed with the patient and highlighted on the [facility]. Pt was advised to check BP on a regular basis and hold BP meds if systolic was less than 532. Pt and wife verbalized understanding of this parameter. Pt was also advised to change positions slowly and give his body time to adjust as well as getting assistance when ambulating so he doesn't fall down.

## 2014-07-18 NOTE — Progress Notes (Signed)
[place]  SUBJECTIVE: Improving clinically. No chest pain or arrhythmia. No syncope. Does complain of headache   Filed Vitals:   07/18/14 0300 07/18/14 0400 07/18/14 0500 07/18/14 0600  BP:  102/56    Pulse: 73 72 70   Temp:      TempSrc:      Resp: 16 21 10    Height:      Weight:    87.8 kg (193 lb 9 oz)  SpO2: 96% 94% 89%     Intake/Output Summary (Last 24 hours) at 07/18/14 0629 Last data filed at 07/18/14 0000  Gross per 24 hour  Intake  89.13 ml  Output   1375 ml  Net -1285.87 ml    LABS: Basic Metabolic Panel:  Recent Labs  07/15/14 0922 07/15/14 1048 07/17/14 0437  NA 137  --  137  K 4.5 4.4 3.1*  CL 102  --  101  CO2 25  --  30  GLUCOSE 121*  --  97  BUN 22*  --  17  CREATININE 1.02  --  0.99  CALCIUM 8.2*  --  8.0*  MG  --   --  1.9   Liver Function Tests:  Recent Labs  07/15/14 0644 07/15/14 0922  AST 132* 124*  ALT 122* 116*  ALKPHOS 62 58  BILITOT 1.0 0.4  PROT 6.7 6.3*  ALBUMIN 3.0* 2.9*   No results for input(s): LIPASE, AMYLASE in the last 72 hours. CBC:  Recent Labs  07/16/14 0654 07/17/14 0437  WBC 7.9 7.8  HGB 11.8* 12.1*  HCT 36.3* 37.3*  MCV 90.6 91.6  PLT 149* 149*   Cardiac Enzymes:  Recent Labs  07/15/14 0644 07/15/14 0922 07/15/14 1732  TROPONINI 0.25* 0.32* 0.55*   BNP: Invalid input(s): POCBNP D-Dimer: No results for input(s): DDIMER in the last 72 hours. Hemoglobin A1C: No results for input(s): HGBA1C in the last 72 hours. Fasting Lipid Panel: No results for input(s): CHOL, HDL, LDLCALC, TRIG, CHOLHDL, LDLDIRECT in the last 72 hours. Thyroid Function Tests: No results for input(s): TSH, T4TOTAL, T3FREE, THYROIDAB in the last 72 hours.  Invalid input(s): FREET3 Anemia Panel: No results for input(s): VITAMINB12, FOLATE, FERRITIN, TIBC, IRON, RETICCTPCT in the last 72 hours.   Physical Exam: Blood pressure 102/56, pulse 70, temperature 97.8 F (36.6 C),  temperature source Oral, resp. rate 10, height 6' (1.829 m), weight 87.8 kg (193 lb 9 oz), SpO2 89 %.    General appearance: alert and cooperative Neck: no adenopathy, no carotid bruit, no JVD, supple, symmetrical, trachea midline and thyroid not enlarged, symmetric, no tenderness/mass/nodules Cardio: regular rate and rhythm GI: soft, non-tender; bowel sounds normal; no masses,  no organomegaly Extremities: extremities normal, atraumatic, no cyanosis or edema Pulses: 2+ and symmetric Neurologic: Grossly normal  TELEMETRY: Reviewed telemetry pt in sinus rhythm.  ASSESSMENT AND PLAN:  Principal Problem:   Chest pain-has no significant troponin elevation. Patient does have history of coronary disease status post coronary artery bypass grafting in the late 90s with a PCI of his left main into the circumflex several years ago after non-ST elevation myocardial infarction. This resulted in an ischemic cardiomyopathy and he had an AICD placed. Currently no further arrhythmia on increased dose of amiodarone. Likely elevated serum troponin secondary to his ventricular fibrillation and anti-tachycardic pacing event. Certainly progression of his coronary disease may be exacerbating his arrhythmia however is clinically stable with no chest pain at present. Will ambulate the patient today and if  stable this morning consider discharge with follow-up with his electrophysiologist as scheduled in [facility] tomorrow. Consideration for outpatient left cardiac catheterization to evaluate for evidence of progression of coronary disease as the cause of his arrhythmia needs to be discussed. Will discuss this with his electrophysiologist and consider outpatient invasive versus noninvasive workup of this. We'll continue with current regimen although we'll change from carvedilol to metoprolol as the patient appears to have been more stable on this regimen. We'll use metoprolol succinate secondary to his cardiomyopathy. Active  Problems:   CAD (coronary artery disease)-as per above   Fall at home-secondary to ventricular fibrillation a properly treated with his AICD with anti-tachycardic pacing. We'll continue with amiodarone will decrease to 400 mg daily and have him have a follow-up with his electrophysiologist as discussed above.  Nausea and vomiting-clinically stable at present with no further nausea or vomiting. Patient's appetite remains poor this may be secondary to passive congestion versus GI etiology. [name]   [name]., MD, [facility] 07/18/2014 6:29 AM

## 2014-07-18 NOTE — Care Management Note (Signed)
Case Management Note  Patient Details  Name: [name] MRN: [id number] Date of Birth: [birth year]  Subjective/Objective:   Ordered from wheeled walker from [place]. Patient updated. No other home health needs noted. Good support system.  OP services ordered.                  Action/Plan: Home with self care  Expected Discharge Date:                  Expected Discharge Plan:     In-House Referral:     Discharge planning Services  CM Consult  Post Acute Care Choice:    Choice offered to:     DME Arranged:    DME Agency:     HH Arranged:    HH Agency:     Status of Service:  In process, will continue to follow  Medicare Important Message Given:  Yes-second notification given Date Medicare IM Given:    Medicare IM give by:    Date Additional Medicare IM Given:    Additional Medicare Important Message give by:     If discussed at [place] of Stay Meetings, dates discussed:    Additional Comments:  Jolly Mango, RN 07/18/2014, 10:33 AM

## 2014-07-18 NOTE — Progress Notes (Signed)
Physical Therapy Treatment Patient Details Name: [name] MRN: [id number] DOB: 1940/02/21 Today's Date: 07/18/2014    History of Present Illness      PT Comments    Pt stated he feels better today and is ready to leave the hospital.  Pt cardiovascular fitness was improved today as indicated by a 100 ft increase in ambulation distance with no complaints of fatigue. Pt. Is able to complete bed mobility/sit-to-stand/stand-to-sit transfers with modified independence using a RW. Gait was much improved today and there were no significant gait deviations/patterns present with RW ambulation. Pt. Complains of dizziness with sitting at the EOB and standing, but symptoms reside after 1 min. Pt would benefit from outpatient PT when he leaves the hospital in order to improve cardiovascular endurance/balance/mobility transfers.   Follow Up Recommendations  Outpatient PT     Equipment Recommendations  Rolling walker with 5" wheels    Recommendations for Other Services       Precautions / Restrictions Precautions Precautions: Fall Restrictions Weight Bearing Restrictions: No    Mobility  Bed Mobility Overal bed mobility: Independent                Transfers Overall transfer level: Needs assistance Equipment used: Rolling walker (2 wheeled) Transfers: Sit to/from Stand Sit to Stand: Mod indep            Ambulation/Gait Ambulation/Gait assistance: Modified independent (Device/Increase time) Ambulation Distance (Feet): 250 Feet Assistive device: Rolling walker (2 wheeled) Gait Pattern/deviations: WFL(Within Functional Limits)         Stairs Stairs: Yes Stairs assistance: Min assist   Number of Stairs: 10 General stair comments:  (Hand held assist on L side. )  Wheelchair Mobility    Modified Rankin (Stroke Patients Only)       Balance Overall balance assessment: Modified Independent Sitting-balance support: Bilateral upper extremity  supported Sitting balance-Leahy Scale: Normal       Standing balance-Leahy Scale: Good                      Cognition Arousal/Alertness: Awake/alert Behavior During Therapy: WFL for tasks assessed/performed Overall Cognitive Status: Within Functional Limits for tasks assessed                      Exercises Other Exercises Other Exercises: Verbally rewiewed car transfer technique and ensured pt understanding, education included feet touching back of chair before descending and holding on to the car rail.     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" PT Goal Formulation: With patient Time For Goal Achievement: 07/31/14 Potential to Achieve Goals: Good Progress towards PT goals: Progressing toward goals    Frequency  Min 2X/week    PT Plan Current plan remains appropriate    Co-evaluation             End of Session Equipment Utilized During Treatment: Gait belt Activity Tolerance: Patient tolerated treatment well Patient left: in chair;with call bell/phone within reach;with chair alarm set;with family/visitor present     Time: 8144-8185 PT  Time Calculation (min) (ACUTE ONLY): 27 min  Charges:  $Gait Training: 8-22 mins $Therapeutic Activity: 8-22 mins                    G Codes:      Treatment session directed and transcribed by Zannie Kehr, SPT; directly supervised by Tera Helper, [place]. Owens Shark, PT, DPT, NCS 07/18/2014, 1:50 PM [phone]

## 2014-07-18 NOTE — Progress Notes (Signed)
Pt is alert and oriented. C/o head ache. PRN pain meds given. Slept well  durning night. Resting  Comfortably in bed without any distress.will continue to observe closely.

## 2014-07-19 DIAGNOSIS — H66001 Acute suppurative otitis media without spontaneous rupture of ear drum, right ear: Secondary | ICD-10-CM | POA: Diagnosis not present

## 2014-07-19 DIAGNOSIS — I472 Ventricular tachycardia: Secondary | ICD-10-CM | POA: Diagnosis not present

## 2014-07-19 DIAGNOSIS — H8111 Benign paroxysmal vertigo, right ear: Secondary | ICD-10-CM | POA: Diagnosis not present

## 2014-07-26 DIAGNOSIS — R5381 Other malaise: Secondary | ICD-10-CM | POA: Diagnosis not present

## 2014-07-26 DIAGNOSIS — M6281 Muscle weakness (generalized): Secondary | ICD-10-CM | POA: Diagnosis not present

## 2014-07-26 DIAGNOSIS — E538 Deficiency of other specified B group vitamins: Secondary | ICD-10-CM | POA: Diagnosis not present

## 2014-07-26 DIAGNOSIS — R42 Dizziness and giddiness: Secondary | ICD-10-CM | POA: Diagnosis not present

## 2014-07-26 DIAGNOSIS — B370 Candidal stomatitis: Secondary | ICD-10-CM | POA: Diagnosis not present

## 2014-07-28 DIAGNOSIS — I509 Heart failure, unspecified: Secondary | ICD-10-CM | POA: Diagnosis not present

## 2014-07-28 DIAGNOSIS — M069 Rheumatoid arthritis, unspecified: Secondary | ICD-10-CM | POA: Diagnosis not present

## 2014-07-28 DIAGNOSIS — I2510 Atherosclerotic heart disease of native coronary artery without angina pectoris: Secondary | ICD-10-CM | POA: Diagnosis not present

## 2014-07-28 DIAGNOSIS — M6281 Muscle weakness (generalized): Secondary | ICD-10-CM | POA: Diagnosis not present

## 2014-07-28 DIAGNOSIS — Z7952 Long term (current) use of systemic steroids: Secondary | ICD-10-CM | POA: Diagnosis not present

## 2014-07-28 DIAGNOSIS — Z7982 Long term (current) use of aspirin: Secondary | ICD-10-CM | POA: Diagnosis not present

## 2014-07-31 DIAGNOSIS — M069 Rheumatoid arthritis, unspecified: Secondary | ICD-10-CM | POA: Diagnosis not present

## 2014-07-31 DIAGNOSIS — Z7952 Long term (current) use of systemic steroids: Secondary | ICD-10-CM | POA: Diagnosis not present

## 2014-07-31 DIAGNOSIS — I509 Heart failure, unspecified: Secondary | ICD-10-CM | POA: Diagnosis not present

## 2014-07-31 DIAGNOSIS — I2510 Atherosclerotic heart disease of native coronary artery without angina pectoris: Secondary | ICD-10-CM | POA: Diagnosis not present

## 2014-07-31 DIAGNOSIS — Z7982 Long term (current) use of aspirin: Secondary | ICD-10-CM | POA: Diagnosis not present

## 2014-07-31 DIAGNOSIS — M6281 Muscle weakness (generalized): Secondary | ICD-10-CM | POA: Diagnosis not present

## 2014-08-01 DIAGNOSIS — R5381 Other malaise: Secondary | ICD-10-CM | POA: Diagnosis not present

## 2014-08-01 DIAGNOSIS — R42 Dizziness and giddiness: Secondary | ICD-10-CM | POA: Diagnosis not present

## 2014-08-01 DIAGNOSIS — R740 Nonspecific elevation of levels of transaminase and lactic acid dehydrogenase [LDH]: Secondary | ICD-10-CM | POA: Diagnosis not present

## 2014-08-01 DIAGNOSIS — M6281 Muscle weakness (generalized): Secondary | ICD-10-CM | POA: Diagnosis not present

## 2014-08-01 DIAGNOSIS — R269 Unspecified abnormalities of gait and mobility: Secondary | ICD-10-CM | POA: Diagnosis not present

## 2014-08-01 DIAGNOSIS — R6889 Other general symptoms and signs: Secondary | ICD-10-CM | POA: Diagnosis not present

## 2014-08-02 DIAGNOSIS — Z7982 Long term (current) use of aspirin: Secondary | ICD-10-CM | POA: Diagnosis not present

## 2014-08-02 DIAGNOSIS — Z7952 Long term (current) use of systemic steroids: Secondary | ICD-10-CM | POA: Diagnosis not present

## 2014-08-02 DIAGNOSIS — M6281 Muscle weakness (generalized): Secondary | ICD-10-CM | POA: Diagnosis not present

## 2014-08-02 DIAGNOSIS — I2510 Atherosclerotic heart disease of native coronary artery without angina pectoris: Secondary | ICD-10-CM | POA: Diagnosis not present

## 2014-08-02 DIAGNOSIS — I509 Heart failure, unspecified: Secondary | ICD-10-CM | POA: Diagnosis not present

## 2014-08-02 DIAGNOSIS — M069 Rheumatoid arthritis, unspecified: Secondary | ICD-10-CM | POA: Diagnosis not present

## 2014-08-04 DIAGNOSIS — I2510 Atherosclerotic heart disease of native coronary artery without angina pectoris: Secondary | ICD-10-CM | POA: Diagnosis not present

## 2014-08-04 DIAGNOSIS — I472 Ventricular tachycardia: Secondary | ICD-10-CM | POA: Diagnosis not present

## 2014-08-04 DIAGNOSIS — Z8679 Personal history of other diseases of the circulatory system: Secondary | ICD-10-CM | POA: Diagnosis not present

## 2014-08-04 DIAGNOSIS — E785 Hyperlipidemia, unspecified: Secondary | ICD-10-CM | POA: Diagnosis not present

## 2014-08-07 DIAGNOSIS — M0589 Other rheumatoid arthritis with rheumatoid factor of multiple sites: Secondary | ICD-10-CM | POA: Diagnosis not present

## 2014-08-07 DIAGNOSIS — R42 Dizziness and giddiness: Secondary | ICD-10-CM | POA: Diagnosis not present

## 2014-08-07 DIAGNOSIS — R531 Weakness: Secondary | ICD-10-CM | POA: Diagnosis not present

## 2014-08-07 DIAGNOSIS — M2550 Pain in unspecified joint: Secondary | ICD-10-CM | POA: Diagnosis not present

## 2014-08-07 DIAGNOSIS — R5382 Chronic fatigue, unspecified: Secondary | ICD-10-CM | POA: Diagnosis not present

## 2014-08-08 DIAGNOSIS — Z7982 Long term (current) use of aspirin: Secondary | ICD-10-CM | POA: Diagnosis not present

## 2014-08-08 DIAGNOSIS — I509 Heart failure, unspecified: Secondary | ICD-10-CM | POA: Diagnosis not present

## 2014-08-08 DIAGNOSIS — M069 Rheumatoid arthritis, unspecified: Secondary | ICD-10-CM | POA: Diagnosis not present

## 2014-08-08 DIAGNOSIS — M6281 Muscle weakness (generalized): Secondary | ICD-10-CM | POA: Diagnosis not present

## 2014-08-08 DIAGNOSIS — I2510 Atherosclerotic heart disease of native coronary artery without angina pectoris: Secondary | ICD-10-CM | POA: Diagnosis not present

## 2014-08-08 DIAGNOSIS — Z7952 Long term (current) use of systemic steroids: Secondary | ICD-10-CM | POA: Diagnosis not present

## 2014-08-09 DIAGNOSIS — M6281 Muscle weakness (generalized): Secondary | ICD-10-CM | POA: Diagnosis not present

## 2014-08-09 DIAGNOSIS — Z7982 Long term (current) use of aspirin: Secondary | ICD-10-CM | POA: Diagnosis not present

## 2014-08-09 DIAGNOSIS — Z7952 Long term (current) use of systemic steroids: Secondary | ICD-10-CM | POA: Diagnosis not present

## 2014-08-09 DIAGNOSIS — M069 Rheumatoid arthritis, unspecified: Secondary | ICD-10-CM | POA: Diagnosis not present

## 2014-08-09 DIAGNOSIS — I2510 Atherosclerotic heart disease of native coronary artery without angina pectoris: Secondary | ICD-10-CM | POA: Diagnosis not present

## 2014-08-09 DIAGNOSIS — I509 Heart failure, unspecified: Secondary | ICD-10-CM | POA: Diagnosis not present

## 2014-08-10 DIAGNOSIS — H8110 Benign paroxysmal vertigo, unspecified ear: Secondary | ICD-10-CM | POA: Diagnosis not present

## 2014-08-10 DIAGNOSIS — R634 Abnormal weight loss: Secondary | ICD-10-CM | POA: Diagnosis not present

## 2014-08-10 DIAGNOSIS — R5381 Other malaise: Secondary | ICD-10-CM | POA: Diagnosis not present

## 2014-08-10 DIAGNOSIS — M6281 Muscle weakness (generalized): Secondary | ICD-10-CM | POA: Diagnosis not present

## 2014-08-10 DIAGNOSIS — R6889 Other general symptoms and signs: Secondary | ICD-10-CM | POA: Diagnosis not present

## 2014-08-10 DIAGNOSIS — R100 Acute abdomen: Secondary | ICD-10-CM | POA: Diagnosis not present

## 2014-08-10 DIAGNOSIS — R05 Cough: Secondary | ICD-10-CM | POA: Diagnosis not present

## 2014-08-14 DIAGNOSIS — M6281 Muscle weakness (generalized): Secondary | ICD-10-CM | POA: Diagnosis not present

## 2014-08-14 DIAGNOSIS — I509 Heart failure, unspecified: Secondary | ICD-10-CM | POA: Diagnosis not present

## 2014-08-14 DIAGNOSIS — I2510 Atherosclerotic heart disease of native coronary artery without angina pectoris: Secondary | ICD-10-CM | POA: Diagnosis not present

## 2014-08-14 DIAGNOSIS — M069 Rheumatoid arthritis, unspecified: Secondary | ICD-10-CM | POA: Diagnosis not present

## 2014-08-14 DIAGNOSIS — Z7952 Long term (current) use of systemic steroids: Secondary | ICD-10-CM | POA: Diagnosis not present

## 2014-08-14 DIAGNOSIS — Z7982 Long term (current) use of aspirin: Secondary | ICD-10-CM | POA: Diagnosis not present

## 2014-08-16 DIAGNOSIS — R2681 Unsteadiness on feet: Secondary | ICD-10-CM | POA: Diagnosis not present

## 2014-08-16 DIAGNOSIS — H8111 Benign paroxysmal vertigo, right ear: Secondary | ICD-10-CM | POA: Diagnosis not present

## 2014-08-17 ENCOUNTER — Other Ambulatory Visit: Payer: Self-pay | Admitting: Gastroenterology

## 2014-08-17 DIAGNOSIS — R935 Abnormal findings on diagnostic imaging of other abdominal regions, including retroperitoneum: Secondary | ICD-10-CM

## 2014-08-17 DIAGNOSIS — R2681 Unsteadiness on feet: Secondary | ICD-10-CM | POA: Diagnosis not present

## 2014-08-17 DIAGNOSIS — D649 Anemia, unspecified: Secondary | ICD-10-CM | POA: Diagnosis not present

## 2014-08-17 DIAGNOSIS — R110 Nausea: Secondary | ICD-10-CM | POA: Diagnosis not present

## 2014-08-17 DIAGNOSIS — R630 Anorexia: Secondary | ICD-10-CM | POA: Diagnosis not present

## 2014-08-17 DIAGNOSIS — H8111 Benign paroxysmal vertigo, right ear: Secondary | ICD-10-CM | POA: Diagnosis not present

## 2014-08-17 DIAGNOSIS — R634 Abnormal weight loss: Secondary | ICD-10-CM | POA: Diagnosis not present

## 2014-08-18 ENCOUNTER — Encounter: Payer: Self-pay | Admitting: Emergency Medicine

## 2014-08-18 ENCOUNTER — Observation Stay
Admission: EM | Admit: 2014-08-18 | Discharge: 2014-08-20 | Disposition: A | Discharge status: Home / Self Care | Payer: Medicare Other | Attending: Internal Medicine | Admitting: Internal Medicine

## 2014-08-18 ENCOUNTER — Emergency Department: Payer: Medicare Other

## 2014-08-18 DIAGNOSIS — I2510 Atherosclerotic heart disease of native coronary artery without angina pectoris: Secondary | ICD-10-CM | POA: Diagnosis not present

## 2014-08-18 DIAGNOSIS — R1013 Epigastric pain: Secondary | ICD-10-CM | POA: Diagnosis not present

## 2014-08-18 DIAGNOSIS — E440 Moderate protein-calorie malnutrition: Secondary | ICD-10-CM | POA: Diagnosis not present

## 2014-08-18 DIAGNOSIS — Z7982 Long term (current) use of aspirin: Secondary | ICD-10-CM | POA: Insufficient documentation

## 2014-08-18 DIAGNOSIS — Z8489 Family history of other specified conditions: Secondary | ICD-10-CM | POA: Insufficient documentation

## 2014-08-18 DIAGNOSIS — R112 Nausea with vomiting, unspecified: Secondary | ICD-10-CM | POA: Diagnosis not present

## 2014-08-18 DIAGNOSIS — I255 Ischemic cardiomyopathy: Secondary | ICD-10-CM | POA: Insufficient documentation

## 2014-08-18 DIAGNOSIS — I4901 Ventricular fibrillation: Secondary | ICD-10-CM | POA: Insufficient documentation

## 2014-08-18 DIAGNOSIS — Z95810 Presence of automatic (implantable) cardiac defibrillator: Secondary | ICD-10-CM | POA: Insufficient documentation

## 2014-08-18 DIAGNOSIS — M1990 Unspecified osteoarthritis, unspecified site: Secondary | ICD-10-CM | POA: Diagnosis not present

## 2014-08-18 DIAGNOSIS — I6529 Occlusion and stenosis of unspecified carotid artery: Secondary | ICD-10-CM

## 2014-08-18 DIAGNOSIS — Z6827 Body mass index (BMI) 27.0-27.9, adult: Secondary | ICD-10-CM | POA: Diagnosis not present

## 2014-08-18 DIAGNOSIS — E0590 Thyrotoxicosis, unspecified without thyrotoxic crisis or storm: Secondary | ICD-10-CM | POA: Insufficient documentation

## 2014-08-18 DIAGNOSIS — I472 Ventricular tachycardia, unspecified: Secondary | ICD-10-CM

## 2014-08-18 DIAGNOSIS — Y92009 Unspecified place in unspecified non-institutional (private) residence as the place of occurrence of the external cause: Secondary | ICD-10-CM | POA: Diagnosis not present

## 2014-08-18 DIAGNOSIS — Z951 Presence of aortocoronary bypass graft: Secondary | ICD-10-CM | POA: Insufficient documentation

## 2014-08-18 DIAGNOSIS — E780 Pure hypercholesterolemia: Secondary | ICD-10-CM | POA: Insufficient documentation

## 2014-08-18 DIAGNOSIS — I6523 Occlusion and stenosis of bilateral carotid arteries: Secondary | ICD-10-CM | POA: Diagnosis not present

## 2014-08-18 DIAGNOSIS — R42 Dizziness and giddiness: Secondary | ICD-10-CM | POA: Diagnosis not present

## 2014-08-18 DIAGNOSIS — R079 Chest pain, unspecified: Secondary | ICD-10-CM | POA: Insufficient documentation

## 2014-08-18 DIAGNOSIS — I5022 Chronic systolic (congestive) heart failure: Secondary | ICD-10-CM | POA: Insufficient documentation

## 2014-08-18 DIAGNOSIS — M069 Rheumatoid arthritis, unspecified: Secondary | ICD-10-CM | POA: Insufficient documentation

## 2014-08-18 DIAGNOSIS — I639 Cerebral infarction, unspecified: Secondary | ICD-10-CM

## 2014-08-18 DIAGNOSIS — S299XXA Unspecified injury of thorax, initial encounter: Secondary | ICD-10-CM | POA: Diagnosis not present

## 2014-08-18 DIAGNOSIS — Z888 Allergy status to other drugs, medicaments and biological substances status: Secondary | ICD-10-CM | POA: Diagnosis not present

## 2014-08-18 DIAGNOSIS — Z9861 Coronary angioplasty status: Secondary | ICD-10-CM | POA: Diagnosis not present

## 2014-08-18 DIAGNOSIS — R55 Syncope and collapse: Secondary | ICD-10-CM | POA: Diagnosis not present

## 2014-08-18 DIAGNOSIS — E785 Hyperlipidemia, unspecified: Secondary | ICD-10-CM | POA: Insufficient documentation

## 2014-08-18 DIAGNOSIS — I252 Old myocardial infarction: Secondary | ICD-10-CM | POA: Insufficient documentation

## 2014-08-18 DIAGNOSIS — I10 Essential (primary) hypertension: Secondary | ICD-10-CM | POA: Insufficient documentation

## 2014-08-18 DIAGNOSIS — E860 Dehydration: Secondary | ICD-10-CM | POA: Diagnosis not present

## 2014-08-18 DIAGNOSIS — I4510 Unspecified right bundle-branch block: Secondary | ICD-10-CM | POA: Insufficient documentation

## 2014-08-18 DIAGNOSIS — M545 Low back pain: Secondary | ICD-10-CM | POA: Insufficient documentation

## 2014-08-18 DIAGNOSIS — Z79899 Other long term (current) drug therapy: Secondary | ICD-10-CM | POA: Insufficient documentation

## 2014-08-18 LAB — CBC WITH DIFFERENTIAL/PLATELET
BASOS ABS: 0.1 10*3/uL (ref 0–0.1)
BASOS PCT: 1 %
EOS ABS: 0.1 10*3/uL (ref 0–0.7)
Eosinophils Relative: 1 %
HCT: 40.1 % (ref 40.0–52.0)
Hemoglobin: 13.1 g/dL (ref 13.0–18.0)
Lymphocytes Relative: 13 %
Lymphs Abs: 1.3 10*3/uL (ref 1.0–3.6)
MCH: 29.1 pg (ref 26.0–34.0)
MCHC: 32.6 g/dL (ref 32.0–36.0)
MCV: 89.3 fL (ref 80.0–100.0)
MONOS PCT: 15 %
Monocytes Absolute: 1.5 10*3/uL — ABNORMAL HIGH (ref 0.2–1.0)
NEUTROS PCT: 70 %
Neutro Abs: 7.3 10*3/uL — ABNORMAL HIGH (ref 1.4–6.5)
PLATELETS: 170 10*3/uL (ref 150–440)
RBC: 4.49 MIL/uL (ref 4.40–5.90)
RDW: 14.2 % (ref 11.5–14.5)
WBC: 10.2 10*3/uL (ref 3.8–10.6)

## 2014-08-18 LAB — TROPONIN I: Troponin I: 0.03 ng/mL (ref <0.031)

## 2014-08-18 LAB — BASIC METABOLIC PANEL
Anion gap: 11 (ref 5–15)
BUN: 23 mg/dL — ABNORMAL HIGH (ref 6–20)
CALCIUM: 9 mg/dL (ref 8.9–10.3)
CO2: 24 mmol/L (ref 22–32)
Chloride: 103 mmol/L (ref 101–111)
Creatinine, Ser: 1.1 mg/dL (ref 0.61–1.24)
GFR calc Af Amer: >60 mL/min (ref >60)
GFR calc non Af Amer: >60 mL/min (ref >60)
Glucose, Bld: 104 mg/dL — ABNORMAL HIGH (ref 65–99)
Potassium: 4.4 mmol/L (ref 3.5–5.1)
SODIUM: 138 mmol/L (ref 135–145)

## 2014-08-18 LAB — MAGNESIUM: MAGNESIUM: 1.8 mg/dL (ref 1.7–2.4)

## 2014-08-18 MED ORDER — ACETAMINOPHEN 325 MG PO TABS
650.0000 mg | ORAL_TABLET | Freq: Four times a day (QID) | ORAL | Status: DC | PRN
  Administered 2014-08-18: 650 mg via ORAL
  Filled 2014-08-18: qty 2

## 2014-08-18 MED ORDER — ENOXAPARIN SODIUM 40 MG/0.4ML ~~LOC~~ SOLN
40.0000 mg | SUBCUTANEOUS | Status: DC
  Administered 2014-08-19 (×2): 40 mg via SUBCUTANEOUS
  Filled 2014-08-18 (×2): qty 0.4

## 2014-08-18 MED ORDER — ACETAMINOPHEN 650 MG RE SUPP: 650.0000 mg | Freq: Four times a day (QID) | RECTAL | Status: DC | PRN

## 2014-08-18 MED ORDER — MAGNESIUM SULFATE 2 GM/50ML IV SOLN
2.0000 g | Freq: Once | INTRAVENOUS | Status: AC
Start: 2014-08-18 — End: 2014-08-18
  Administered 2014-08-18: 2 g via INTRAVENOUS
  Filled 2014-08-18: qty 50

## 2014-08-18 NOTE — ED Notes (Signed)
Patient states that he had a syncopal episode and fell. Patient reports that he had a similar episode about 5 weeks ago and his heart rate has increased to 140's and his pacemaker started working. Patient with some shortness of breath and nausea. Denies any injury.

## 2014-08-18 NOTE — H&P (Signed)
[place] [facility] [place] NAME: [name]    MR#:  [id number]  DATE OF BIRTH:  09/26/1940  DATE OF ADMISSION:  08/18/2014  PRIMARY CARE PHYSICIAN: [name], MD   REQUESTING/REFERRING PHYSICIAN: Lisa Roca  CHIEF COMPLAINT:   Chief Complaint  Patient presents with  . Loss of Consciousness  . Fall  . Nausea    HISTORY OF PRESENT ILLNESS:  [name]  is a 74 y.o. male with a known history of cardiomyopathy with a defibrillator, ventricular tachycardia history, congestive heart failure, hyperthyroidism, rheumatoid arthritis, coronary artery disease. He presents again to the hospital after passing out today. His wife heard him fall and she came into the room and he was waking up. He states he felt well prior to the fall and after the fall. No loss of bowel or bladder function. About 5 weeks ago this happened previously where he hit his head. Since that time he's been having problems with being swimmy headed and dizzy and very unstable with his walking and he's been very weak. He's also had a poor appetite and some weight loss.  PAST MEDICAL HISTORY:   Past Medical History  Diagnosis Date  . Leg weakness   . Balance problem   . Arthritis   . Sinus headache   . High cholesterol   . Collagen vascular disease     RA since 2014  . CHF (congestive heart failure)   . Coronary artery disease   . Myocardial infarction   . AICD (automatic cardioverter/defibrillator) present   . Dysrhythmia     PAST SURGICAL HISTORY:   Past Surgical History  Procedure Laterality Date  . Cardiac surgery    . Stents    . Heart pump    . Psychologist, forensic    . Coronary artery bypass graft    . Coronary angioplasty    . Insert / replace / remove pacemaker      SOCIAL HISTORY:   Social History  Substance Use Topics  . Smoking status: Never Smoker   . Smokeless tobacco: Never Used  . Alcohol Use: No    FAMILY HISTORY:   Family  History  Problem Relation Age of Onset  . Alzheimer's disease Mother     DRUG ALLERGIES:   Allergies  Allergen Reactions  . Pravastatin Other (See Comments)    Reaction:  Joint pain     REVIEW OF SYSTEMS:  CONSTITUTIONAL: No fever, positive for fatigue and weakness. Positive for weight loss EYES: No blurred or double vision. Positive for fuzzy vision and wears glasses. EARS, NOSE, AND THROAT: No tinnitus or ear pain. No sore throat. Positive for runny nose and postnasal drip positive for decreased hearing. The other day he felt like a blowing out of his right ear. Positive for dysphagia to solids and pills RESPIRATORY: No cough, positive for shortness of breath with being excited, wheezing or hemoptysis.  CARDIOVASCULAR: No chest pain, orthopnea, edema.  GASTROINTESTINAL: Positive for nausea and occasional vomiting, no diarrhea or abdominal pain. No blood in bowel movements. Positive for constipation GENITOURINARY: No dysuria, hematuria.  ENDOCRINE: No polyuria, nocturia,  HEMATOLOGY: No anemia, easy bruising or bleeding SKIN: No rash or lesion. MUSCULOSKELETAL: Positive for rheumatoid arthritis.   NEUROLOGIC: Positive for vertigo and unsteady gait and syncope PSYCHIATRY: No anxiety or depression.   MEDICATIONS AT HOME:   Prior to Admission medications   Medication Sig Start Date End Date Taking? Authorizing Provider  acetaminophen (TYLENOL)  325 MG tablet Take 2 tablets (650 mg total) by mouth every 6 (six) hours as needed for mild pain (or Fever >/= 101). 07/18/14  Yes Nicholes Mango, MD  amiodarone (PACERONE) 200 MG tablet Take 2 tablets (400 mg total) by mouth daily. 07/18/14  Yes Nicholes Mango, MD  aspirin EC 81 MG tablet Take 81 mg by mouth daily.   Yes Historical Provider, MD  cetirizine (ZYRTEC) 10 MG tablet Take 10 mg by mouth daily.   Yes Historical Provider, MD  Cholecalciferol (VITAMIN D3) 2000 UNITS TABS Take 2,000 Units by mouth daily.    Yes Historical Provider, MD   docusate sodium (COLACE) 100 MG capsule Take 100 mg by mouth every evening.   Yes Historical Provider, MD  fluticasone (FLONASE) 50 MCG/ACT nasal spray Place 1 spray into both nostrils daily as needed for rhinitis.    Yes Historical Provider, MD  furosemide (LASIX) 40 MG tablet Take 40 mg by mouth daily as needed for edema.   Yes Historical Provider, MD  methimazole (TAPAZOLE) 10 MG tablet Take 10 mg by mouth 3 (three) times daily.   Yes Historical Provider, MD  metoprolol succinate (TOPROL-XL) 25 MG 24 hr tablet Take 1 tablet (25 mg total) by mouth daily. 07/18/14  Yes Nicholes Mango, MD  nitroGLYCERIN (NITROSTAT) 0.4 MG SL tablet Place 0.4 mg under the tongue every 5 (five) minutes as needed for chest pain.   Yes Historical Provider, MD  omeprazole (PRILOSEC) 40 MG capsule Take 40 mg by mouth daily.   Yes Historical Provider, MD  predniSONE (DELTASONE) 1 MG tablet Take 4 mg by mouth daily. Pt takes with a 5mg  tablet.   Yes Historical Provider, MD  predniSONE (DELTASONE) 5 MG tablet Take 5 mg by mouth daily. Pt takes with four 1mg  tablets.   Yes Historical Provider, MD  spironolactone (ALDACTONE) 25 MG tablet Take 1 tablet (25 mg total) by mouth daily. 07/18/14  Yes Nicholes Mango, MD  traMADol (ULTRAM) 50 MG tablet Take 50 mg by mouth every 6 (six) hours as needed for moderate pain.    Yes Historical Provider, MD  meclizine (ANTIVERT) 25 MG tablet Take 1 tablet (25 mg total) by mouth 3 (three) times daily. Patient not taking: Reported on 08/18/2014 07/18/14   Nicholes Mango, MD  meclizine (ANTIVERT) 25 MG tablet Take 1 tablet (25 mg total) by mouth 2 (two) times daily as needed for dizziness. Patient not taking: Reported on 08/18/2014 07/18/14   Nicholes Mango, MD      VITAL SIGNS:  Blood pressure 130/74, pulse 68, temperature 97.6 F (36.4 C), temperature source Oral, resp. rate 19, height 5\' 8"  (1.727 m), weight 80.74 kg (178 lb), SpO2 97 %.  PHYSICAL EXAMINATION:  GENERAL:  74 y.o.-year-old patient lying  in the bed with no acute distress.  EYES: Pupils equal, round, reactive to light and accommodation. No scleral icterus. Extraocular muscles intact.  HEENT: Head atraumatic, normocephalic. Oropharynx and nasopharynx clear.  NECK:  Supple, no jugular venous distention. No thyroid enlargement, no tenderness.  LUNGS: Normal breath sounds bilaterally, no wheezing, rales,rhonchi or crepitation. No use of accessory muscles of respiration.  CARDIOVASCULAR: S1, S2 normal. 2/6 systolic murmur, no rubs, or gallops.  ABDOMEN: Soft, nontender, nondistended. Bowel sounds present. No organomegaly or mass.  EXTREMITIES: No pedal edema, cyanosis, or clubbing.  NEUROLOGIC: Cranial nerves II through XII are intact. Muscle strength 5/5 in all extremities. Sensation intact. Gait not checked.  PSYCHIATRIC: The patient is alert and oriented x 3.  SKIN: No rash, lesion, or ulcer.   LABORATORY PANEL:   CBC  Recent Labs Lab 08/18/14 2044  WBC 10.2  HGB 13.1  HCT 40.1  PLT 170   ------------------------------------------------------------------------------------------------------------------  Chemistries   Recent Labs Lab 08/18/14 2044  NA 138  K 4.4  CL 103  CO2 24  GLUCOSE 104*  BUN 23*  CREATININE 1.10  CALCIUM 9.0  MG 1.8   ------------------------------------------------------------------------------------------------------------------  Cardiac Enzymes  Recent Labs Lab 08/18/14 2044  TROPONINI 0.03   ------------------------------------------------------------------------------------------------------------------  RADIOLOGY:  Dg Chest Port 1 View  08/18/2014   CLINICAL DATA:  Syncope, fall  EXAM: PORTABLE CHEST - 1 VIEW  COMPARISON:  CTA chest dated 07/14/2014  FINDINGS: Lungs are clear.  No pleural effusion or pneumothorax.  The heart is top-normal in size. Postsurgical changes related to prior CABG. Left subclavian ICD.  IMPRESSION: No evidence of acute cardiopulmonary disease.    Electronically Signed   By: Julian Hy M.D.   On: 08/18/2014 20:48    EKG:   Normal sinus rhythm 73 bpm left atrial enlargement, left axis deviation and right bundle branch block  IMPRESSION AND PLAN:   1. Repeated syncope. I will have nursing staff called [place] to interrogate defibrillator. Cardiology consultation in the a.m. I added on a magnesium level and it's borderline low at 1.8 I will give IV magnesium. 2. Vertigo since the last time he hit his head. The patient stopped his meclizine since it's not working. I will get physical therapy evaluation 3 rheumatoid arthritis- on prednisone 4. Hyperthyroidism- on methimazole. 5. History of systolic heart failure- no signs of heart failure currently.  All the records are reviewed and case discussed with ED provider. Management plans discussed with the patient, family and they are in agreement.  CODE STATUS: DO NOT RESUSCITATE  TOTAL TIME TAKING CARE OF THIS PATIENT: 50 minutes.    Loletha Grayer M.D on 08/18/2014 at 10:20 PM  Between 7am to 6pm - Pager - [phone]  After 6pm call admission pager [facility]  Office  [phone]  CC: Primary care physician; [name], MD

## 2014-08-18 NOTE — ED Provider Notes (Addendum)
[facility] Emergency Department Provider Note   ____________________________________________  Time seen: 8:15 PM I have reviewed the triage vital signs and the triage nursing note.  HISTORY  Chief Complaint Loss of Consciousness; Fall; and Nausea   Historian Patient  HPI [name] is a 74 y.o. male who is standing up and then passed out. He had no preceding symptoms including no shortness of breath, nausea, sweating, weakness, dizziness, or chest pain. He did have an episode similar to this about 5 weeks ago when he was found to have had a V. Fib arrhythmia which was over paced by his AICD. He was admitted at that point in time. They're concerned the same thing may have happened given symptoms are exactly the same.    Past Medical History  Diagnosis Date  . Leg weakness   . Balance problem   . Arthritis   . Sinus headache   . High cholesterol   . Collagen vascular disease     RA since 2014  . CHF (congestive heart failure)   . Coronary artery disease   . Myocardial infarction   . AICD (automatic cardioverter/defibrillator) present   . Dysrhythmia     Patient Active Problem List   Diagnosis Date Noted  . Chest pain 07/15/2014  . CAD (coronary artery disease) 07/15/2014  . Fall at home 07/15/2014  . Nausea and vomiting 07/15/2014  . Nausea & vomiting 07/15/2014  . Abnormality of gait 12/13/2013  . Low back pain 12/13/2013  . Arthritis   . Sinus headache   . Injury of kidney 12/02/2013  . Arteriosclerosis of coronary artery 12/02/2013  . Body water dehydration 12/02/2013  . Sinus infection 12/02/2013  . Idiopathic ventricular tachycardia 09/14/2012  . Heart failure, systolic 40/98/1191  . Automatic implantable cardioverter-defibrillator in situ 07/26/2012  . Acute MI, subendocardial 07/12/2012  . Essential (primary) hypertension 07/12/2012  . HLD (hyperlipidemia) 07/12/2012  . BP (high blood pressure) 07/12/2012  . Acute non-ST  segment elevation myocardial infarction 07/12/2012  . Arthritis or polyarthritis, rheumatoid 07/12/2012    Past Surgical History  Procedure Laterality Date  . Cardiac surgery    . Stents    . Heart pump    . Psychologist, forensic    . Coronary artery bypass graft    . Coronary angioplasty    . Insert / replace / remove pacemaker      Current Outpatient Rx  Name  Route  Sig  Dispense  Refill  . acetaminophen (TYLENOL) 325 MG tablet   Oral   Take 2 tablets (650 mg total) by mouth every 6 (six) hours as needed for mild pain (or Fever >/= 101).         [name] amiodarone (PACERONE) 200 MG tablet   Oral   Take 2 tablets (400 mg total) by mouth daily.   30 tablet   0   . aspirin EC 81 MG tablet   Oral   Take 81 mg by mouth daily.         . cetirizine (ZYRTEC) 10 MG tablet   Oral   Take 10 mg by mouth daily.         . Cholecalciferol (VITAMIN D3) 2000 UNITS TABS   Oral   Take 2,000 Units by mouth daily.          [name] docusate sodium (COLACE) 100 MG capsule   Oral   Take 100 mg by mouth every evening.         [name]  fluticasone (FLONASE) 50 MCG/ACT nasal spray   Each Nare   Place 1 spray into both nostrils daily as needed for rhinitis.          . furosemide (LASIX) 40 MG tablet   Oral   Take 40 mg by mouth daily as needed for edema.         . methimazole (TAPAZOLE) 10 MG tablet   Oral   Take 10 mg by mouth 3 (three) times daily.         . metoprolol succinate (TOPROL-XL) 25 MG 24 hr tablet   Oral   Take 1 tablet (25 mg total) by mouth daily.   30 tablet   0     Hold taking Toprol-XL if systolic blood pressure i ...   . nitroGLYCERIN (NITROSTAT) 0.4 MG SL tablet   Sublingual   Place 0.4 mg under the tongue every 5 (five) minutes as needed for chest pain.         [name] omeprazole (PRILOSEC) 40 MG capsule   Oral   Take 40 mg by mouth daily.         . predniSONE (DELTASONE) 1 MG tablet   Oral   Take 4 mg by mouth daily. Pt takes with a 5mg  tablet.         .  predniSONE (DELTASONE) 5 MG tablet   Oral   Take 5 mg by mouth daily. Pt takes with four 1mg  tablets.         [name] spironolactone (ALDACTONE) 25 MG tablet   Oral   Take 1 tablet (25 mg total) by mouth daily.   30 tablet   0     Hold spironolactone if systolic blood pressure is  ...   . traMADol (ULTRAM) 50 MG tablet   Oral   Take 50 mg by mouth every 6 (six) hours as needed for moderate pain.          . meclizine (ANTIVERT) 25 MG tablet   Oral   Take 1 tablet (25 mg total) by mouth 3 (three) times daily. Patient not taking: Reported on 08/18/2014   30 tablet   0   . meclizine (ANTIVERT) 25 MG tablet   Oral   Take 1 tablet (25 mg total) by mouth 2 (two) times daily as needed for dizziness. Patient not taking: Reported on 08/18/2014   30 tablet   0     Allergies Pravastatin  Family History  Problem Relation Age of Onset  . Alzheimer's disease Mother     Social History Social History  Substance Use Topics  . Smoking status: Never Smoker   . Smokeless tobacco: Never Used  . Alcohol Use: No    Review of Systems  Constitutional: Negative for fever. Eyes: Negative for visual changes. ENT: Negative for sore throat. Cardiovascular: Negative for chest pain. Respiratory: Negative for shortness of breath. Gastrointestinal: Negative for abdominal pain, vomiting and diarrhea. Genitourinary: Negative for dysuria. Musculoskeletal: Negative for back pain. Skin: Negative for rash. Neurological: Negative for headaches, focal weakness or numbness.positive for generalized weakness for 5 weeks 10 point Review of Systems otherwise negative ____________________________________________   PHYSICAL EXAM:  VITAL SIGNS: ED Triage Vitals  Enc Vitals Group     BP 08/18/14 1917 119/65 mmHg     Pulse Rate 08/18/14 1917 72     Resp 08/18/14 1917 20     Temp 08/18/14 1917 97.6 F (36.4 C)     Temp Source 08/18/14 1917 Oral  SpO2 08/18/14 1917 99 %     Weight 08/18/14 1917  178 lb (80.74 kg)     Height 08/18/14 1917 5\' 8"  (1.727 m)     Head Cir --      Peak Flow --      Pain Score 08/18/14 1947 0     Pain Loc --      Pain Edu? --      Excl. in [place]? --      Constitutional: Alert and oriented. Well appearing and in no distress. Eyes: Conjunctivae are normal. PERRL. Normal extraocular movements. ENT   Head: Normocephalic and atraumatic.   Nose: No congestion/rhinnorhea.   Mouth/Throat: Mucous membranes are moist.   Neck: No stridor.no C-spine tenderness. Cardiovascular/Chest: Normal rate, regular rhythm.  No murmurs, rubs, or gallops. Respiratory: Normal respiratory effort without tachypnea nor retractions. Breath sounds are clear and equal bilaterally. No wheezes/rales/rhonchi. Gastrointestinal: Soft. No distention, no guarding, no rebound. Nontender   Genitourinary/rectal:Deferred Musculoskeletal: Nontender with normal range of motion in all extremities. No joint effusions.  No lower extremity tenderness nor edema. Neurologic:  Normal speech and language. No gross or focal neurologic deficits are appreciated. Skin:  Skin is warm, dry and intact. No rash noted. Psychiatric: Mood and affect are normal. Speech and behavior are normal. Patient exhibits appropriate insight and judgment.  ____________________________________________   EKG I, Lisa Roca, MD, the attending physician have personally viewed and interpreted all ECGs.  73 beats) normal sinus rhythm. Left axis deviation. Right bundle branch block. T-wave inversion anterolaterally. EKG similar to prior ____________________________________________  LABS (pertinent positives/negatives)  CBC is so a white blood count 10.2, hemoglobin 13.1, and platelet count 170 Troponin 9.48 Metabolic panel without significant abnormality  ____________________________________________  RADIOLOGY All Xrays were viewed by me. Imaging interpreted by Radiologist.  Chest x-ray portable:  Negative __________________________________________  PROCEDURES  Procedure(s) performed: None Critical Care performed: None  ____________________________________________   ED COURSE / ASSESSMENT AND PLAN  CONSULTATIONS: hospitalist for admission  Pertinent labs & imaging results that were available during my care of the patient were reviewed by me and considered in my medical decision making (see chart for details).   Concern for arrhythmia given the patient's history and similar episode about 5 weeks ago.. Patient will likely need device interrogated.  No focal neurologic deficit, no evidence of stroke or intracranial source of syncope.  Was not vasovagal by history, and no history concerning for dehydration. Or orthostatic hypotension.  Patient / Family / Caregiver informed of clinical course, medical decision-making process, and agree with plan.    ___________________________________________   FINAL CLINICAL IMPRESSION(S) / ED DIAGNOSES   Final diagnoses:  Syncope, unspecified syncope type       Lisa Roca, MD 08/18/14 2121  Lisa Roca, MD 08/18/14 2137

## 2014-08-18 NOTE — Progress Notes (Signed)
Patient ID: [name], male   DOB: [birth year], 74 y.o.   MRN: [id number] Spoke with [facility] representative to [employer] defibrillator.  He stated he will come tonight.  Loletha Grayer, MD

## 2014-08-19 ENCOUNTER — Observation Stay: Payer: Medicare Other

## 2014-08-19 DIAGNOSIS — I472 Ventricular tachycardia: Secondary | ICD-10-CM | POA: Diagnosis not present

## 2014-08-19 DIAGNOSIS — E440 Moderate protein-calorie malnutrition: Secondary | ICD-10-CM

## 2014-08-19 DIAGNOSIS — I639 Cerebral infarction, unspecified: Secondary | ICD-10-CM | POA: Diagnosis not present

## 2014-08-19 DIAGNOSIS — R1013 Epigastric pain: Secondary | ICD-10-CM | POA: Diagnosis not present

## 2014-08-19 DIAGNOSIS — E0590 Thyrotoxicosis, unspecified without thyrotoxic crisis or storm: Secondary | ICD-10-CM | POA: Diagnosis not present

## 2014-08-19 DIAGNOSIS — I214 Non-ST elevation (NSTEMI) myocardial infarction: Secondary | ICD-10-CM | POA: Diagnosis not present

## 2014-08-19 DIAGNOSIS — I6523 Occlusion and stenosis of bilateral carotid arteries: Secondary | ICD-10-CM | POA: Diagnosis not present

## 2014-08-19 DIAGNOSIS — R55 Syncope and collapse: Secondary | ICD-10-CM | POA: Diagnosis not present

## 2014-08-19 DIAGNOSIS — E46 Unspecified protein-calorie malnutrition: Secondary | ICD-10-CM | POA: Diagnosis not present

## 2014-08-19 LAB — BASIC METABOLIC PANEL
Anion gap: 9 (ref 5–15)
BUN: 19 mg/dL (ref 6–20)
CALCIUM: 8.7 mg/dL — AB (ref 8.9–10.3)
CO2: 27 mmol/L (ref 22–32)
CREATININE: 0.98 mg/dL (ref 0.61–1.24)
Chloride: 105 mmol/L (ref 101–111)
GFR calc non Af Amer: >60 mL/min (ref >60)
Glucose, Bld: 86 mg/dL (ref 65–99)
Potassium: 3.8 mmol/L (ref 3.5–5.1)
SODIUM: 141 mmol/L (ref 135–145)

## 2014-08-19 LAB — CBC
HEMATOCRIT: 38.7 % — AB (ref 40.0–52.0)
HEMOGLOBIN: 12.9 g/dL — AB (ref 13.0–18.0)
MCH: 30.3 pg (ref 26.0–34.0)
MCHC: 33.4 g/dL (ref 32.0–36.0)
MCV: 90.5 fL (ref 80.0–100.0)
Platelets: 155 10*3/uL (ref 150–440)
RBC: 4.27 MIL/uL — ABNORMAL LOW (ref 4.40–5.90)
RDW: 14.2 % (ref 11.5–14.5)
WBC: 9.8 10*3/uL (ref 3.8–10.6)

## 2014-08-19 LAB — TROPONIN I
TROPONIN I: 0.03 ng/mL (ref <0.031)
Troponin I: 0.03 ng/mL (ref <0.031)

## 2014-08-19 LAB — T4, FREE: FREE T4: 4.56 ng/dL — AB (ref 0.61–1.12)

## 2014-08-19 LAB — TSH: TSH: 0.033 u[IU]/mL — ABNORMAL LOW (ref 0.350–4.500)

## 2014-08-19 MED ORDER — ASPIRIN EC 81 MG PO TBEC
81.0000 mg | DELAYED_RELEASE_TABLET | Freq: Every day | ORAL | Status: DC
  Administered 2014-08-19 – 2014-08-20 (×2): 81 mg via ORAL
  Filled 2014-08-19 (×2): qty 1

## 2014-08-19 MED ORDER — PREDNISONE 10 MG PO TABS
5.0000 mg | ORAL_TABLET | Freq: Every day | ORAL | Status: DC
  Administered 2014-08-19 – 2014-08-20 (×2): 5 mg via ORAL
  Filled 2014-08-19 (×2): qty 1

## 2014-08-19 MED ORDER — SPIRONOLACTONE 25 MG PO TABS
25.0000 mg | ORAL_TABLET | Freq: Every day | ORAL | Status: DC
  Administered 2014-08-19 – 2014-08-20 (×2): 25 mg via ORAL
  Filled 2014-08-19 (×2): qty 1

## 2014-08-19 MED ORDER — DOCUSATE SODIUM 100 MG PO CAPS
100.0000 mg | ORAL_CAPSULE | Freq: Every evening | ORAL | Status: DC
  Administered 2014-08-19: 100 mg via ORAL
  Filled 2014-08-19: qty 1

## 2014-08-19 MED ORDER — VITAMIN D 1000 UNITS PO TABS
2000.0000 [IU] | ORAL_TABLET | Freq: Every day | ORAL | Status: DC
  Administered 2014-08-19 – 2014-08-20 (×2): 2000 [IU] via ORAL
  Filled 2014-08-19 (×2): qty 2

## 2014-08-19 MED ORDER — FUROSEMIDE 40 MG PO TABS: 40.0000 mg | ORAL_TABLET | Freq: Every day | ORAL | Status: DC | PRN

## 2014-08-19 MED ORDER — PREDNISONE 1 MG PO TABS
4.0000 mg | ORAL_TABLET | Freq: Every day | ORAL | Status: DC
  Administered 2014-08-19 – 2014-08-20 (×2): 4 mg via ORAL
  Filled 2014-08-19 (×2): qty 4

## 2014-08-19 MED ORDER — PANTOPRAZOLE SODIUM 40 MG PO TBEC
40.0000 mg | DELAYED_RELEASE_TABLET | Freq: Every day | ORAL | Status: DC
  Administered 2014-08-19 – 2014-08-20 (×2): 40 mg via ORAL
  Filled 2014-08-19 (×2): qty 1

## 2014-08-19 MED ORDER — LORATADINE 10 MG PO TABS
10.0000 mg | ORAL_TABLET | Freq: Every day | ORAL | Status: DC
  Administered 2014-08-19 – 2014-08-20 (×2): 10 mg via ORAL
  Filled 2014-08-19 (×2): qty 1

## 2014-08-19 MED ORDER — FLUTICASONE PROPIONATE 50 MCG/ACT NA SUSP: 1.0000 | Freq: Every day | NASAL | Status: DC | PRN

## 2014-08-19 MED ORDER — TRAMADOL HCL 50 MG PO TABS: 50.0000 mg | ORAL_TABLET | Freq: Four times a day (QID) | ORAL | Status: DC | PRN

## 2014-08-19 MED ORDER — PNEUMOCOCCAL VAC POLYVALENT 25 MCG/0.5ML IJ INJ
0.5000 mL | INJECTION | INTRAMUSCULAR | Status: DC
  Filled 2014-08-19: qty 0.5

## 2014-08-19 MED ORDER — METOPROLOL SUCCINATE ER 25 MG PO TB24
25.0000 mg | ORAL_TABLET | Freq: Two times a day (BID) | ORAL | Status: DC
  Administered 2014-08-19 – 2014-08-20 (×2): 25 mg via ORAL
  Filled 2014-08-19 (×2): qty 1

## 2014-08-19 MED ORDER — NITROGLYCERIN 0.4 MG SL SUBL: 0.4000 mg | SUBLINGUAL_TABLET | SUBLINGUAL | Status: DC | PRN

## 2014-08-19 MED ORDER — METOPROLOL SUCCINATE ER 25 MG PO TB24
25.0000 mg | ORAL_TABLET | Freq: Every day | ORAL | Status: DC
  Filled 2014-08-19: qty 1

## 2014-08-19 MED ORDER — AMIODARONE HCL 200 MG PO TABS
400.0000 mg | ORAL_TABLET | Freq: Every day | ORAL | Status: DC
  Administered 2014-08-19 – 2014-08-20 (×2): 400 mg via ORAL
  Filled 2014-08-19 (×2): qty 2

## 2014-08-19 MED ORDER — POTASSIUM CHLORIDE IN NACL 20-0.9 MEQ/L-% IV SOLN
INTRAVENOUS | Status: DC
  Administered 2014-08-19 – 2014-08-20 (×2): via INTRAVENOUS
  Filled 2014-08-19 (×4): qty 1000

## 2014-08-19 MED ORDER — METHIMAZOLE 10 MG PO TABS
10.0000 mg | ORAL_TABLET | Freq: Three times a day (TID) | ORAL | Status: DC
  Administered 2014-08-19 – 2014-08-20 (×4): 10 mg via ORAL
  Filled 2014-08-19 (×4): qty 1

## 2014-08-19 NOTE — Progress Notes (Signed)
Initial Nutrition Assessment  DOCUMENTATION CODES:   Non-severe (moderate) malnutrition in context of chronic illness  INTERVENTION:   Meals and Snacks: Cater to patient preferences; pt may benefit from smaller, more frequent meals. At present, will receive milkshake at [facility]: pt does not really like Boost or Ensure; has used [employer] made as a milkshake with icecream or smoothie with fruit at home; will send milkshake at 3pm.    NUTRITION DIAGNOSIS:   Inadequate oral intake related to poor appetite, chronic illness as evidenced by percent weight loss, mild depletion of body fat, mild depletion of muscle mass, moderate depletions of muscle mass.  GOAL:   Patient will meet greater than or equal to 90% of their needs   MONITOR:    (Energy Intake, Anthropometrics, Electrolyte/Renal Profile, Glucose Profile, Digestive System)  REASON FOR ASSESSMENT:   Malnutrition Screening Tool, Consult Assessment of nutrition requirement/status  ASSESSMENT:    Pt admitted s/p fall, loss of consciousness  Past Medical History  Diagnosis Date  . Leg weakness   . Balance problem   . Arthritis   . Sinus headache   . High cholesterol   . Collagen vascular disease     RA since 2014  . CHF (congestive heart failure)   . Coronary artery disease   . Myocardial infarction   . AICD (automatic cardioverter/defibrillator) present   . Dysrhythmia    Past Surgical History  Procedure Laterality Date  . Cardiac surgery    . Stents    . Heart pump    . Psychologist, forensic    . Coronary artery bypass graft    . Coronary angioplasty    . Insert / replace / remove pacemaker       Diet Order:  Diet 2 gram sodium Room service appropriate?: Yes; Fluid consistency:: Thin   Energy Intake: pt ate small bowl of raisin bran, banana and orange juice at breakfast this AM, Pt reports poor appetite. Did not really want to order anything at lunch but did get pt to  order some soup and jello  Food and nutrition related history: pt reports poor appetite essentially over the past 2 years after MI; pt reports he thinks the medicine he is on is causing this. Also noted hx of hyperthyroidism with low TSH at present. Pt reports bad taste in mouth and dry mouth as well as poor appetite. Pt reports in the last 2 months in particular he has to force himself to eat; might eat a boiled egg or cereal for breakfast. Could not tell writer what he eats for lunch and dinner. Pt does supplement occasionally with Milkshake made with [employer] Instant Breakfast.   Electrolyte and Renal Profile:  Recent Labs Lab 08/18/14 2044 08/19/14 0411  BUN 23* 19  CREATININE 1.10 0.98  NA 138 141  K 4.4 3.8  MG 1.8  --    Meds: NS at 75 ml/hr, prednisone  Skin:  Reviewed, no issues  Last BM:  8/11  Height:   Ht Readings from Last 1 Encounters:  08/18/14 5\' 8"  (1.727 m)    Weight: pt reports 30 pound wt loss in past 2 months; 14.2% wt loss. Noted 6.2% wt loss in 1 month per weight encounters.   Wt Readings from Last 1 Encounters:  08/19/14 181 lb 11.2 oz (82.419 kg)   Wt Readings from Last 10 Encounters:  08/19/14 181 lb 11.2 oz (82.419 kg)  07/18/14 193 lb 9 oz (87.8 kg)  01/25/14  205 lb (92.987 kg)  12/13/13 200 lb (90.719 kg)   Nutrition Focused Physical Exam: Nutrition-Focused physical exam completed. Findings are mild fat depletion, mild/moderate muscle depletion, and mild edema.    BMI:  Body mass index is 27.63 kg/(m^2).  Estimated Nutritional Needs:   Kcal:  0712-1975 kcals (BEE 1534, 1.3 AF, 1.0-1.2 IF)   Protein:  82-98 g (1.0-1.2 g/kg)   Fluid:  2050-2460 mL (25-30 ml/kG)   MODERATE Care Level  Kerman Passey MS, RD, LDN [phone] Pager

## 2014-08-19 NOTE — Consult Note (Signed)
[facility]  CARDIOLOGY CONSULT NOTE  Patient ID: [name] MRN: [id number] DOB/AGE: 1940-04-09 74 y.o.  Admit date: 08/18/2014 Referring Physician [place] Primary Physician   Primary Cardiologist Mykle Pascua  Reason for Consultation syncope  HPI: Patient is a 74 year old male with history of ischemic cardiomyopathy who has an AICD in place. He has a history of a left internal mammary to the LAD, saphenous vein graft to the RCA with cardiac catheterization in 2014 with a non-ST elevation myocardial infarction. He had a patent LAD LIMA with an occluded saphenous vein graft to the RCA and other vessels. He underwent PCI of the left main circumflex with 2 DES stents. His ejection fraction was 20% post PCI. He had a Hamer placed. He has been treated with appropriate medications. He was admitted in July of this year with a syncopal episode noted to have had a VF arrest. That spontaneously recovered prior to AICD firing. He ruled out for myocardial infarction. He had his amiodarone increased to 400 mg from 200 mg daily. He now returns after a syncopal episode. Interrogation of his device again revealed a VT VF event. He did not meet criteria for his device for treatment. Spontaneous revolved. He had no trauma to his head. His medications. He is currently stable in normal sinus rhythm. His device was reprogrammed to rate of 185 sensing for delivery of treatment. He has an appointment with [facility] for mid September for consideration for VT ablation.  ROS Review of Systems - General ROS: negative Respiratory ROS: no cough, shortness of breath, or wheezing Cardiovascular ROS: no chest pain or dyspnea on exertion Gastrointestinal ROS: no abdominal pain, change in bowel habits, or black or bloody stools Neurological ROS: no TIA or stroke symptoms   Past Medical History  Diagnosis Date  . Leg weakness   . Balance problem   .  Arthritis   . Sinus headache   . High cholesterol   . Collagen vascular disease     RA since 2014  . CHF (congestive heart failure)   . Coronary artery disease   . Myocardial infarction   . AICD (automatic cardioverter/defibrillator) present   . Dysrhythmia     Family History  Problem Relation Age of Onset  . Alzheimer's disease Mother     Social History   Social History  . Marital Status: Married    Spouse Name: [name]  . Number of Children: 3  . Years of Education: 12   Occupational History  .      Retired   Social History Main Topics  . Smoking status: Never Smoker   . Smokeless tobacco: Never Used  . Alcohol Use: No  . Drug Use: No  . Sexual Activity: Not on file   Other Topics Concern  . Not on file   Social History Narrative   Patient lives at home with his wife [name]).   Retired.   Education 12th grade.   Right handed.   Caffeine one cup daily.    Past Surgical History  Procedure Laterality Date  . Cardiac surgery    . Stents    . Heart pump    . Psychologist, forensic    . Coronary artery bypass graft    . Coronary angioplasty    . Insert / replace / remove pacemaker       Prescriptions prior to admission  Medication Sig Dispense Refill Last Dose  . acetaminophen (TYLENOL) 325 MG tablet Take  2 tablets (650 mg total) by mouth every 6 (six) hours as needed for mild pain (or Fever >/= 101).   Past Week at Unknown time  . amiodarone (PACERONE) 200 MG tablet Take 2 tablets (400 mg total) by mouth daily. 30 tablet 0 08/17/2014 at Unknown time  . aspirin EC 81 MG tablet Take 81 mg by mouth daily.   08/18/2014 at Unknown time  . cetirizine (ZYRTEC) 10 MG tablet Take 10 mg by mouth daily.   08/18/2014 at Unknown time  . Cholecalciferol (VITAMIN D3) 2000 UNITS TABS Take 2,000 Units by mouth daily.    08/18/2014 at Unknown time  . docusate sodium (COLACE) 100 MG capsule Take 100 mg by mouth every evening.   08/17/2014 at Unknown time  . fluticasone (FLONASE) 50 MCG/ACT nasal  spray Place 1 spray into both nostrils daily as needed for rhinitis.    Past Month at Unknown time  . furosemide (LASIX) 40 MG tablet Take 40 mg by mouth daily as needed for edema.   PRN at PRN  . methimazole (TAPAZOLE) 10 MG tablet Take 10 mg by mouth 3 (three) times daily.   08/18/2014 at Unknown time  . metoprolol succinate (TOPROL-XL) 25 MG 24 hr tablet Take 1 tablet (25 mg total) by mouth daily. 30 tablet 0 08/17/2014 at 1900  . nitroGLYCERIN (NITROSTAT) 0.4 MG SL tablet Place 0.4 mg under the tongue every 5 (five) minutes as needed for chest pain.   PRN at PRN  . omeprazole (PRILOSEC) 40 MG capsule Take 40 mg by mouth daily.   08/18/2014 at Unknown time  . predniSONE (DELTASONE) 1 MG tablet Take 4 mg by mouth daily. Pt takes with a 5mg  tablet.   08/18/2014 at Unknown time  . predniSONE (DELTASONE) 5 MG tablet Take 5 mg by mouth daily. Pt takes with four 1mg  tablets.   08/18/2014 at Unknown time  . spironolactone (ALDACTONE) 25 MG tablet Take 1 tablet (25 mg total) by mouth daily. 30 tablet 0 08/18/2014 at Unknown time  . traMADol (ULTRAM) 50 MG tablet Take 50 mg by mouth every 6 (six) hours as needed for moderate pain.    Past Week at Unknown time  . meclizine (ANTIVERT) 25 MG tablet Take 1 tablet (25 mg total) by mouth 3 (three) times daily. (Patient not taking: Reported on 08/18/2014) 30 tablet 0   . meclizine (ANTIVERT) 25 MG tablet Take 1 tablet (25 mg total) by mouth 2 (two) times daily as needed for dizziness. (Patient not taking: Reported on 08/18/2014) 30 tablet 0     Physical Exam: Blood pressure 104/60, pulse 60, temperature 97.7 F (36.5 C), temperature source Oral, resp. rate 16, height 5\' 8"  (1.727 m), weight 82.419 kg (181 lb 11.2 oz), SpO2 93 %.   General appearance: alert and cooperative Head: Normocephalic, without obvious abnormality, atraumatic Resp: clear to auscultation bilaterally Cardio: regular rate and rhythm, S1, S2 normal, no murmur, click, rub or gallop GI: soft,  non-tender; bowel sounds normal; no masses,  no organomegaly Extremities: extremities normal, atraumatic, no cyanosis or edema Pulses: 2+ and symmetric Neurologic: Grossly normal Labs:   Lab Results  Component Value Date   WBC 9.8 08/19/2014   HGB 12.9* 08/19/2014   HCT 38.7* 08/19/2014   MCV 90.5 08/19/2014   PLT 155 08/19/2014    Recent Labs Lab 08/19/14 0411  NA 141  K 3.8  CL 105  CO2 27  BUN 19  CREATININE 0.98  CALCIUM 8.7*  GLUCOSE 86  Lab Results  Component Value Date   TROPONINI 0.03 08/19/2014      Radiology: No airspace disease EKG: Sinus rhythm with right bundle-branch block nonspecific ST-T wave changes. No change from baseline.  ASSESSMENT AND PLAN: Patient with history of ischemic cardio myopathy status post PCI and coronary artery bypass grafting. Now admitted after a syncopal episode likely secondary to ventricular arrhythmia. Device reprogrammed to more thoroughly sensing of the rate. Patient is currently stable. We'll continue with amiodarone and 40 mg daily. We will increase his metoprolol to 50 mg of succinyl daily. Would follow for 24 hours if stable consider discharge home with outpatient follow-up with [facility] electrophys[facility] and [facility] clinic cardiology. Signed: [name] MD, [facility] 08/19/2014, 11:43 AM

## 2014-08-19 NOTE — Progress Notes (Signed)
Physical Therapy Evaluation Patient Details Name: [name] MRN: [id number] DOB: 1940-01-20 Today's Date: 08/19/2014   History of Present Illness  Patient is a 74 y.o. male who presented to ER s/p syncope and fall. Patient has hx of falling. Patient also has cardiomyopathy w/defibrillator, ventricular tachycardia, CHF, hyperthyroidism, RA, and CAD.  Clinical Impression  Patient presents to hospital s/p fall and reportedly has fallen in past. Patient complains of intermittent dizziness with changes in position that usually subside after a few seconds. Is currently seeing a PT in OP for dizziness and has noticed improvements until recently. Patient demonstrated independence in bed mobility and modified independence in transfers/gait. Patient reports using RW/SPC intermittently on occasion at home but admits to grabbing furniture and walls most of the time. Educated patient about importance of using RW at home and potentially installing grab bars/rails in areas like bathroom/garage. Patient admits to having a bucket on which he sits to shower and may benefit from having a shower chair ordered. At this point, patient is at his baseline level of functioning, so no further PT f/u in hospital is needed.    Follow Up Recommendations Outpatient PT    Equipment Recommendations  None recommended by PT;Other (comment) (Recommended patient use RW; needs shower chair, using bucket)    Recommendations for Other Services       Precautions / Restrictions Precautions Precautions: None Restrictions Weight Bearing Restrictions: No      Mobility  Bed Mobility Overal bed mobility: Independent                Transfers Overall transfer level: Independent Equipment used: Rolling walker (2 wheeled)             General transfer comment: Patient performed sit to stand and stand to sit transfers with good safety awareness.  Ambulation/Gait Ambulation/Gait assistance: Modified independent  (Device/Increase time) Ambulation Distance (Feet): 190 Feet Assistive device: Rolling walker (2 wheeled) Gait Pattern/deviations: WFL(Within Functional Limits)     General Gait Details: Patient ambulates at increased cadence, requiring verbal cues to remind of safety awareness. Denied dizziness; no LOB.  Stairs            Wheelchair Mobility    Modified Rankin (Stroke Patients Only)       Balance Overall balance assessment: Modified Independent                                           Pertinent Vitals/Pain Pain Assessment: No/denies pain    Home Living Family/patient expects to be discharged to:: Private residence Living Arrangements: Spouse/significant other Available Help at Discharge: Family;Available 24 hours/day Type of Home: House Home Access: Stairs to enter Entrance Stairs-Rails: None Entrance Stairs-Number of Steps: 3 Home Layout: One level Home Equipment: Walker - 2 wheels;Cane - single point;Wheelchair - manual      Prior Function Level of Independence: Needs assistance   Gait / Transfers Assistance Needed: Patient reports using ADs occasionally but grabs onto furniture/walls for support in home  ADL's / Homemaking Assistance Needed: Wife performs cooking/shopping        Hand Dominance        Extremity/Trunk Assessment   Upper Extremity Assessment: Overall WFL for tasks assessed           Lower Extremity Assessment: Overall WFL for tasks assessed      Cervical / Trunk Assessment: Normal  Communication  Communication: No difficulties  Cognition Arousal/Alertness: Awake/alert Behavior During Therapy: WFL for tasks assessed/performed Overall Cognitive Status: Within Functional Limits for tasks assessed                      General Comments      Exercises        Assessment/Plan    PT Assessment Patent does not need any further PT services  PT Diagnosis Difficulty walking   PT Problem List     PT Treatment Interventions     PT Goals (Current goals can be found in the Care Plan section) Acute Rehab PT Goals Patient Stated Goal: "I want to show you I can go home." PT Goal Formulation: With patient/family Time For Goal Achievement: 09/02/14 Potential to Achieve Goals: Good    Frequency     Barriers to discharge        Co-evaluation               End of Session Equipment Utilized During Treatment: Gait belt Activity Tolerance: Patient tolerated treatment well Patient left: in bed;with call bell/phone within reach;with family/visitor present Nurse Communication: Mobility status    Functional Limitation: Mobility: Walking and moving around Mobility: Walking and Moving Around Current Status ([id number]): At least 1 percent but less than 20 percent impaired, limited or restricted Mobility: Walking and Moving Around Goal Status [phone]): At least 1 percent but less than 20 percent impaired, limited or restricted Mobility: Walking and Moving Around Discharge Status [phone]): At least 1 percent but less than 20 percent impaired, limited or restricted    Time: 1583-0940 PT Time Calculation (min) (ACUTE ONLY): 20 min   Charges:   PT Evaluation $Initial PT Evaluation Tier I: 1 Procedure     PT G Codes:   PT G-Codes **NOT FOR INPATIENT CLASS** Functional Limitation: Mobility: Walking and moving around Mobility: Walking and Moving Around Current Status ([id number]): At least 1 percent but less than 20 percent impaired, limited or restricted Mobility: Walking and Moving Around Goal Status [phone]): At least 1 percent but less than 20 percent impaired, limited or restricted Mobility: Walking and Moving Around Discharge Status [phone]): At least 1 percent but less than 20 percent impaired, limited or restricted    Dorice Lamas, PT, DPT 08/19/2014, 1:46 PM

## 2014-08-19 NOTE — Progress Notes (Signed)
Skin verified on admission by [facility].

## 2014-08-19 NOTE — Progress Notes (Signed)
[place] [facility] [place] NAME: [name]    MR#:  [id number]  DATE OF BIRTH:  03-13-1940  SUBJECTIVE:  CHIEF COMPLAINT:   Chief Complaint  Patient presents with  . Loss of Consciousness  . Fall  . Nausea   patient is 74 year old Caucasian male with spastic or history known for cardiomyopathy with defibrillator placement in the past, ventricular tachycardia, CHF, hyperthyroidism, coronary artery disease who presents to the hospital with nausea, vomiting, weight loss and loss of consciousness. Patient's EKG revealed normal sinus rhythm, possible left atrial enlargement, left axis deviation, right bundle branch block and T abnormalities, consider lateral ischemia. Cardiac enzymes were normal. Interrogation of patient's device revealed V. tach, V. fib event, spontaneously resolved. Patient has an appointment with [facility], electrophysiology to consider be tach ablation. Patient is on high-dose of amiodarone and his thyroid function is severely abnormal, hyperthyroidism is diagnosed and patient is started on methimazole. Patient complains of being severely dehydrated due to abdominal discomfort, nausea, vomiting intermittently as well as some poor appetite. He is being rehydrated since he is orthostatic vital signs are found to be slightly abnormal. Patient denies any chest pains  Review of Systems  Constitutional: Positive for weight loss. Negative for fever and chills.  HENT: Positive for ear discharge, hearing loss and sore throat. Negative for congestion.   Eyes: Negative for blurred vision and double vision.  Respiratory: Negative for cough, sputum production, shortness of breath and wheezing.   Cardiovascular: Negative for chest pain, palpitations, orthopnea, leg swelling and PND.  Gastrointestinal: Positive for nausea, vomiting, diarrhea and constipation. Negative for abdominal pain and blood in stool.  Genitourinary: Negative for dysuria,  urgency, frequency and hematuria.  Musculoskeletal: Negative for falls.  Neurological: Positive for dizziness and weakness. Negative for tremors, focal weakness and headaches.  Endo/Heme/Allergies: Does not bruise/bleed easily.  Psychiatric/Behavioral: Negative for depression. The patient does not have insomnia.     VITAL SIGNS: Blood pressure 104/60, pulse 60, temperature 97.7 F (36.5 C), temperature source Oral, resp. rate 16, height _0  (1.727 m), weight 82.419 kg (181 lb 11.2 oz), SpO2 93 %.  PHYSICAL EXAMINATION:   GENERAL:  74 y.o.-year-old patient lying in the bed with no acute distress.  EYES: Pupils equal, round, reactive to light and accommodation. No scleral icterus. Extraocular muscles intact.  HEENT: Head atraumatic, normocephalic. Oropharynx and nasopharynx clear.  NECK:  Supple, no jugular venous distention. No thyroid enlargement, no tenderness.  LUNGS: Normal breath sounds bilaterally, no wheezing, rales,rhonchi or crepitation. No use of accessory muscles of respiration.  CARDIOVASCULAR: S1, S2 normal. No murmurs, rubs, or gallops.  ABDOMEN: Soft, nontender, nondistended. Bowel sounds present. No organomegaly or mass.  EXTREMITIES: No pedal edema, cyanosis, or clubbing.  NEUROLOGIC: Cranial nerves II through XII are intact. Muscle strength 5/5 in all extremities. Sensation intact. Gait not checked.  PSYCHIATRIC: The patient is alert and oriented x 3.  SKIN: No obvious rash, lesion, or ulcer.   ORDERS/RESULTS REVIEWED:   CBC  Recent Labs Lab 08/18/14 2044 08/19/14 0411  WBC 10.2 9.8  HGB 13.1 12.9*  HCT 40.1 38.7*  PLT 170 155  MCV 89.3 90.5  MCH 29.1 30.3  MCHC 32.6 33.4  RDW 14.2 14.2  LYMPHSABS 1.3  --   MONOABS 1.5*  --   EOSABS 0.1  --   BASOSABS 0.1  --    ------------------------------------------------------------------------------------------------------------------  Chemistries   Recent Labs Lab 08/18/14 2044 08/19/14 0411  NA 138 141  K 4.4 3.8  CL 103 105  CO2 24 27  GLUCOSE 104* 86  BUN 23* 19  CREATININE 1.10 0.98  CALCIUM 9.0 8.7*  MG 1.8  --    ------------------------------------------------------------------------------------------------------------------ estimated creatinine clearance is 69.2 mL/min (by C-G formula based on Cr of 0.98). ------------------------------------------------------------------------------------------------------------------  Recent Labs  08/19/14 0411  TSH 0.033*    Cardiac Enzymes  Recent Labs Lab 08/18/14 2044 08/19/14 0012 08/19/14 0411  TROPONINI 0.03 0.03 0.03   ------------------------------------------------------------------------------------------------------------------ Invalid input(s): POCBNP ---------------------------------------------------------------------------------------------------------------  RADIOLOGY: US Carotid Bilateral  08/19/2014   CLINICAL DATA:  74 year old male with carotid artery stenosis noted on recent CTA of the neck  EXAM: BILATERAL CAROTID DUPLEX ULTRASOUND  TECHNIQUE: Pearline Cables scale imaging, color Doppler and duplex ultrasound were performed of bilateral carotid and vertebral arteries in the neck.  COMPARISON:  CTA neck 07/15/2014  FINDINGS: Criteria: Quantification of carotid stenosis is based on velocity parameters that correlate the residual internal carotid diameter with NASCET-based stenosis levels, using the diameter of the distal internal carotid lumen as the denominator for stenosis measurement.  The following velocity measurements were obtained:  RIGHT  ICA:  112/27 cm/sec  CCA:  78/93 cm/sec  SYSTOLIC ICA/CCA RATIO:  2.0  DIASTOLIC ICA/CCA RATIO:  1.9  ECA:  83 cm/sec  LEFT  ICA:  102/42 cm/sec  CCA:  81/01 cm/sec  SYSTOLIC ICA/CCA RATIO:  1.1  DIASTOLIC ICA/CCA RATIO:  1.9  ECA:  89 cm/sec  RIGHT CAROTID ARTERY: Heterogeneous atherosclerotic plaque in the proximal internal carotid artery results in focal elevation of the peak  systolic velocity. By systolic ICA/ CCA ratio, there is a 50- 69% diameter stenosis. Of note, the threshold is just met for this level of stenosis.  RIGHT VERTEBRAL ARTERY:  Patent with normal antegrade flow.  LEFT CAROTID ARTERY: Trace atherosclerotic plaque which is smooth but heterogeneous. By peak systolic velocity criteria, the estimated stenosis remains less than 50%.  LEFT VERTEBRAL ARTERY:  Patent with normal antegrade flow.  IMPRESSION: 1. Moderate (50- 69%) diameter stenosis of the proximal right internal carotid artery secondary to heterogeneous atherosclerotic plaque. Of note, the findings just meets the threshold for this level of stenosis indicating the stenosis is likely on the low side of the provided range. These findings are congruent with the findings of 50% stenosis on the recent CTA scan of the neck. 2. Mild (1-49%) diameter stenosis of the proximal left internal carotid artery secondary to heterogeneous atherosclerotic plaque. 3. Vertebral arteries are patent with normal antegrade flow. Signed,  Criselda Peaches, MD  [facility]  [facility]   Electronically Signed   By: Jacqulynn Cadet M.D.   On: 08/19/2014 11:49   Dg Chest Port 1 View  08/18/2014   CLINICAL DATA:  Syncope, fall  EXAM: PORTABLE CHEST - 1 VIEW  COMPARISON:  CTA chest dated 07/14/2014  FINDINGS: Lungs are clear.  No pleural effusion or pneumothorax.  The heart is top-normal in size. Postsurgical changes related to prior CABG. Left subclavian ICD.  IMPRESSION: No evidence of acute cardiopulmonary disease.   Electronically Signed   By: Julian Hy M.D.   On: 08/18/2014 20:48    EKG:  Orders placed or performed during the hospital encounter of 08/18/14  . EKG 12-Lead  . EKG 12-Lead    ASSESSMENT AND PLAN:  Active Problems:   Syncope and collapse   Malnutrition of moderate degree 1. Syncope, likely due to ventricular arrhythmia , according to cardiologist,  Dr. Ubaldo Glassing. Patient's device  was reprogrammed to a better sense the rate. Cardiologist recommends to continue amiodarone at current dose and advance patient's metoprolol to 50 mg daily, discharge him home after 24 hour observation for [facility] Electrophysiology appointment for possible V. tach ablation. Patient's carotid ultrasound revealed carotid stenosis bilaterally around 50%. Patient's orthostatic vital signs were abnormal, concerning for syncope, at least partially , related to orthostatic hypotension 2 . Hyperthyroidism, likely due to amiodarone, which unfortunately we cannot discontinue yet. As mentioned above, patient will be evaluated by [facility] electrophysiology and if successful V. tach ablation amiodarone may be discontinued at that point. Continue advanced doses of metoprolol and get endocrinology consultation and follow-up as outpatient. Continue current doses of methimazole. 3. Carotid atherosclerosis with stenosis, moderate on the right and mild on the left, continue aspirin would benefit from statin 4. Dyspepsia, possibly related to amiodarone per gastroenterologist, continue PPI and supportive therapy   Management plans discussed with the patient, family, DR Ubaldo Glassing and they are in agreement.   DRUG ALLERGIES:  Allergies  Allergen Reactions  . Pravastatin Other (See Comments)    Reaction:  Joint pain     CODE STATUS:     Code Status Orders        Start     Ordered   08/18/14 2215  Do not attempt resuscitation (DNR)   Continuous    Question Answer Comment  In the event of cardiac or respiratory ARREST Do not call a "code blue"   In the event of cardiac or respiratory ARREST Do not perform Intubation, CPR, defibrillation or ACLS   In the event of cardiac or respiratory ARREST Use medication by any route, position, wound care, and other measures to relive pain and suffering. May use oxygen, suction and manual treatment of airway obstruction as needed for  comfort.   Comments Nurse may pronounce      08/18/14 2215    Advance Directive Documentation        Most Recent Value   Type of Advance Directive  Out of facility DNR (pink MOST or yellow form)   Pre-existing out of facility DNR order (yellow form or pink MOST form)  Yellow form placed in chart (order not valid for inpatient use)   "MOST" Form in Place?        TOTAL TIME TAKING CARE OF THIS PATIENT: 50 minutes.  Extensive discussion regarding all medical problems discussed with patient as well as family as well as cardiologist, emotional support was provided as well as recommendations. All questions were concerning to , family and patient voiced understanding. Time spent approximately 15 minutes on the discussion  Damek Ende M.D on 08/19/2014 at 2:00 PM  Between 7am to 6pm - Pager - [phone]  After 6pm go to [email or web address] - password [email or web address] [place] [facility]  Office  [phone]  CC: Primary care physician; [name], MD

## 2014-08-20 DIAGNOSIS — E46 Unspecified protein-calorie malnutrition: Secondary | ICD-10-CM | POA: Diagnosis not present

## 2014-08-20 DIAGNOSIS — I639 Cerebral infarction, unspecified: Secondary | ICD-10-CM | POA: Diagnosis not present

## 2014-08-20 DIAGNOSIS — I214 Non-ST elevation (NSTEMI) myocardial infarction: Secondary | ICD-10-CM | POA: Diagnosis not present

## 2014-08-20 DIAGNOSIS — I6529 Occlusion and stenosis of unspecified carotid artery: Secondary | ICD-10-CM

## 2014-08-20 DIAGNOSIS — R1013 Epigastric pain: Secondary | ICD-10-CM | POA: Diagnosis not present

## 2014-08-20 DIAGNOSIS — R55 Syncope and collapse: Secondary | ICD-10-CM | POA: Diagnosis not present

## 2014-08-20 DIAGNOSIS — I472 Ventricular tachycardia, unspecified: Secondary | ICD-10-CM

## 2014-08-20 DIAGNOSIS — I6523 Occlusion and stenosis of bilateral carotid arteries: Secondary | ICD-10-CM | POA: Diagnosis not present

## 2014-08-20 DIAGNOSIS — E0590 Thyrotoxicosis, unspecified without thyrotoxic crisis or storm: Secondary | ICD-10-CM | POA: Diagnosis not present

## 2014-08-20 MED ORDER — METOPROLOL SUCCINATE ER 50 MG PO TB24: 50.0000 mg | ORAL_TABLET | Freq: Every day | ORAL | Status: DC

## 2014-08-20 NOTE — Progress Notes (Signed)
[facility]  SUBJECTIVE: Doing well this morning with no chest pain no arrhythmia and no shortness of breath feeling ready to go home. Nausea and vomiting have improved.   Filed Vitals:   08/19/14 1138 08/19/14 1946 08/20/14 0501 08/20/14 0733  BP: 104/60 121/53 117/59 105/70  Pulse: 60 66 69 69  Temp: 97.7 F (36.5 C) 98.2 F (36.8 C) 98 F (36.7 C) 98.2 F (36.8 C)  TempSrc: Oral Oral Oral Oral  Resp: 16 18 20 20   Height:      Weight:      SpO2: 93% 96% 98% 95%    Intake/Output Summary (Last 24 hours) at 08/20/14 0952 Last data filed at 08/20/14 0825  Gross per 24 hour  Intake    360 ml  Output   2600 ml  Net  -2240 ml    LABS: Basic Metabolic Panel:  Recent Labs  08/18/14 2044 08/19/14 0411  NA 138 141  K 4.4 3.8  CL 103 105  CO2 24 27  GLUCOSE 104* 86  BUN 23* 19  CREATININE 1.10 0.98  CALCIUM 9.0 8.7*  MG 1.8  --    Liver Function Tests: No results for input(s): AST, ALT, ALKPHOS, BILITOT, PROT, ALBUMIN in the last 72 hours. No results for input(s): LIPASE, AMYLASE in the last 72 hours. CBC:  Recent Labs  08/18/14 2044 08/19/14 0411  WBC 10.2 9.8  NEUTROABS 7.3*  --   HGB 13.1 12.9*  HCT 40.1 38.7*  MCV 89.3 90.5  PLT 170 155   Cardiac Enzymes:  Recent Labs  08/18/14 2044 08/19/14 0012 08/19/14 0411  TROPONINI 0.03 0.03 0.03   BNP: Invalid input(s): POCBNP D-Dimer: No results for input(s): DDIMER in the last 72 hours. Hemoglobin A1C: No results for input(s): HGBA1C in the last 72 hours. Fasting Lipid Panel: No results for input(s): CHOL, HDL, LDLCALC, TRIG, CHOLHDL, LDLDIRECT in the last 72 hours. Thyroid Function Tests:  Recent Labs  08/19/14 0411  TSH 0.033*   Anemia Panel: No results for input(s): VITAMINB12, FOLATE, FERRITIN, TIBC, IRON, RETICCTPCT in the last 72 hours.   Physical Exam: Blood pressure 105/70, pulse 69, temperature 98.2 F (36.8 C), temperature source Oral, resp.  rate 20, height 5\' 8"  (1.727 m), weight 82.419 kg (181 lb 11.2 oz), SpO2 95 %.  General appearance: alert and cooperative Resp: clear to auscultation bilaterally Cardio: Sinus bradycardia GI: soft, non-tender; bowel sounds normal; no masses,  no organomegaly Extremities: extremities normal, atraumatic, no cyanosis or edema Pulses: 2+ and symmetric Neurologic: Grossly normal  TELEMETRY: Reviewed telemetry pt in sinus bradycardia  ASSESSMENT AND PLAN:  Active Problems:   Syncope and collapse-secondary to V. tach/V. fib. Spontaneous resolution. AICD reprogrammed to a ventricular tachycardia detection rate of 185 down from 205. He has done well since admission. He was placed on 50 mg of metoprolol succinate above his 25 mg outpatient dose and continued with amiodarone at 400 mg daily. Electrolytes were stable. Patient is okay for discharge today with outpatient follow-up for consideration of ventricular tachycardia ablation.   Malnutrition of moderate degree-improved    [name]., MD, [facility] 08/20/2014 9:52 AM

## 2014-08-20 NOTE — Care Management Note (Signed)
Case Management Note  Patient Details  Name: [name] MRN: [id number] Date of Birth: May 08, 1940  Subjective/Objective:     Discharged home today with [facility] home health services for PT and RN. Discussed referral with the [facility] on-call nurse and she verified that Mr Toepfer resides within their catchment area. Referral request was faxed to [facility] at [phone].               Action/Plan:   Expected Discharge Date:                  Expected Discharge Plan:     In-House Referral:     Discharge planning Services     Post Acute Care Choice:    Choice offered to:     DME Arranged:    DME Agency:     HH Arranged:    [place] Agency:     Status of Service:     Medicare Important Message Given:    Date Medicare IM Given:    Medicare IM give by:    Date Additional Medicare IM Given:    Additional Medicare Important Message give by:     If discussed at [place] of Stay Meetings, dates discussed:    Additional Comments:  Armany Mano A, RN 08/20/2014, 11:53 AM

## 2014-08-20 NOTE — Progress Notes (Signed)
Pt. Discharged to home with [facility] via wc. Discharge instructions and medication regimen reviewed at bedside with patient and wife. Both verbalize understanding of instructions and medication regimen. Prescription included with discharge papers. Patient assessment unchanged from this morning. TELE and IV discontinued per policy.   Confirmed with care management that patient will have home health and that patient is OK to go now.

## 2014-08-20 NOTE — Discharge Summary (Signed)
[facility] [place] NAME: [name]    MR#:  [id number]  DATE OF BIRTH:  Nov 17, 1940  DATE OF ADMISSION:  08/18/2014 ADMITTING PHYSICIAN: Loletha Grayer, MD  DATE OF DISCHARGE: 08/20/2014 11:38 AM  PRIMARY CARE PHYSICIAN: [name], MD     ADMISSION DIAGNOSIS:  Syncope, unspecified syncope type [R55]  DISCHARGE DIAGNOSIS:  Principal Problem:   Syncope and collapse Active Problems:   V-tach   Malnutrition of moderate degree   Hyperthyroidism   Dyspepsia   Carotid stenosis   SECONDARY DIAGNOSIS:   Past Medical History  Diagnosis Date  . Leg weakness   . Balance problem   . Arthritis   . Sinus headache   . High cholesterol   . Collagen vascular disease     RA since 2014  . CHF (congestive heart failure)   . Coronary artery disease   . Myocardial infarction   . AICD (automatic cardioverter/defibrillator) present   . Dysrhythmia     .pro HOSPITAL COURSE:   Patient is 74 year old Caucasian male with past history seemed significant for history of ischemic as well as nonischemic cardiomyopathy, history of V. tach, V. fib, status post defibrillator placement in the past who presents to the hospital with syncope and collapse. EKG done in the emergency room revealed normal sinus rhythm in 70s, possible left atrial enlargement, right pontine block, T-wave abnormality, consider lateral ischemia. The patient was noted to be dehydrated with positive orthostatic vital signs. He admitted of having very poor appetite recently, which was felt to be due to Amiodarone. He was seen by Dr. Ubaldo Glassing, cardiologist, who felt that patient's episode of syncope was very likely due to ventricular arrhythmia. Cardiologist recommended to increase patient's metoprolol XL to 50 mg daily dose. Patient's device was reprogrammed to a better sense rate down from 210 to 185. Patient did well with advanced doses of metoprolol and was felt to be  stable to be discharged home with follow-up with cardiology as well as to get electrophysiology as outpatient. Of note, as workup for patient's syncope, he underwent carotid ultrasound which revealed revealed carotid stenosis bilaterally, right more than left, but not more than 50% per radiologist.  In regards to hyperthyroidism, patient's TSH was checked, was found to be low with elevated free T4 levels. Patient was advised to continue methimazole and follow up with endocrinologist as outpatient. He was also recommended to continue higher dose of beta blocker. In regards to patient's dyspepsia. It was felt that patient's with dyspepsia could have been related to amiodarone. Discussion by problem: 1. Syncope and collapse, likely due to ventricular arrhythmia , according to cardiologist, Dr. Ubaldo Glassing. Patient's device was reprogrammed to a better sense the rate. Cardiologist recommends to continue amiodarone at current dose of 400 mg daily and advance patient's Toprol-XL to 50 mg daily, discharge him home today. 14th of August 2016 with outpatient follow-up at [facility] for possible V. tach ablation. Patient's carotid ultrasound revealed carotid stenosis bilaterally around 50%. Patient's orthostatic vital signs were abnormal, concerning for syncope, at least partially , related to orthostatic hypotension, patient was rehydrated and his blood pressure improved. The patient was advised to drink plenty of fluids.  2 . Hyperthyroidism, likely due to amiodarone, which unfortunately we cannot discontinue yet. As mentioned above, patient will be evaluated by [facility] electrophysiology and if successful V. tach ablation amiodarone could be discontinued at cardiologist at discretion  TSH was found to be that  point. Continue advanced doses of metoprolol and get endocrinology consultation and follow-up as outpatient. Continue current doses of methimazole. TSH was found to be 0.033 with free T4  4 0.56 on this admission 3. Carotid atherosclerosis with stenosis, moderate on the right and mild on the left, continue aspirin would benefit from statin, ? Initiate as outpatient 4. Dyspepsia, possibly related to amiodarone per gastroenterologist, continue PPI and supportive therapy. Patient's oral intake somewhat improved while in the hospital DISCHARGE CONDITIONS:   Fair  CONSULTS OBTAINED:  Treatment Team:  [name], MD  DRUG ALLERGIES:   Allergies  Allergen Reactions  . Pravastatin Other (See Comments)    Reaction:  Joint pain     DISCHARGE MEDICATIONS:   Discharge Medication List as of 08/20/2014 11:01 AM    CONTINUE these medications which have CHANGED   Details  metoprolol succinate (TOPROL-XL) 50 MG 24 hr tablet Take 1 tablet (50 mg total) by mouth daily., Starting 08/20/2014, Until Discontinued, Print      CONTINUE these medications which have NOT CHANGED   Details  acetaminophen (TYLENOL) 325 MG tablet Take 2 tablets (650 mg total) by mouth every 6 (six) hours as needed for mild pain (or Fever >/= 101)., Starting 07/18/2014, Until Discontinued, OTC    amiodarone (PACERONE) 200 MG tablet Take 2 tablets (400 mg total) by mouth daily., Starting 07/18/2014, Until Discontinued, Print    aspirin EC 81 MG tablet Take 81 mg by mouth daily., Until Discontinued, Historical Med    cetirizine (ZYRTEC) 10 MG tablet Take 10 mg by mouth daily., Until Discontinued, Historical Med    Cholecalciferol (VITAMIN D3) 2000 UNITS TABS Take 2,000 Units by mouth daily. , Until Discontinued, Historical Med    docusate sodium (COLACE) 100 MG capsule Take 100 mg by mouth every evening., Until Discontinued, Historical Med    fluticasone (FLONASE) 50 MCG/ACT nasal spray Place 1 spray into both nostrils daily as needed for rhinitis. , Until Discontinued, Historical Med    furosemide (LASIX) 40 MG tablet Take 40 mg by mouth daily as needed for edema., Until Discontinued, Historical Med     methimazole (TAPAZOLE) 10 MG tablet Take 10 mg by mouth 3 (three) times daily., Until Discontinued, Historical Med    nitroGLYCERIN (NITROSTAT) 0.4 MG SL tablet Place 0.4 mg under the tongue every 5 (five) minutes as needed for chest pain., Until Discontinued, Historical Med    omeprazole (PRILOSEC) 40 MG capsule Take 40 mg by mouth daily., Until Discontinued, Historical Med    !! predniSONE (DELTASONE) 1 MG tablet Take 4 mg by mouth daily. Pt takes with a 90m tablet., Until Discontinued, Historical Med    !! predniSONE (DELTASONE) 5 MG tablet Take 5 mg by mouth daily. Pt takes with four 153mtablets., Until Discontinued, Historical Med    spironolactone (ALDACTONE) 25 MG tablet Take 1 tablet (25 mg total) by mouth daily., Starting 07/18/2014, Until Discontinued, Print    traMADol (ULTRAM) 50 MG tablet Take 50 mg by mouth every 6 (six) hours as needed for moderate pain. , Until Discontinued, Historical Med     !! - Potential duplicate medications found. Please discuss with provider.    STOP taking these medications     meclizine (ANTIVERT) 25 MG tablet      meclizine (ANTIVERT) 25 MG tablet          DISCHARGE INSTRUCTIONS:    Patient has follow up with cardiologist, Dr FaUbaldo GlassingPCP Dr. LeGaren Gramsendocrinologist, Dr. SoGabriel Carinan the next  1 week after discharge  If you experience worsening of your admission symptoms, develop shortness of breath, life threatening emergency, suicidal or homicidal thoughts you must seek medical attention immediately by calling 911 or calling your MD immediately  if symptoms less severe.  You Must read complete instructions/literature along with all the possible adverse reactions/side effects for all the Medicines you take and that have been prescribed to you. Take any new Medicines after you have completely understood and accept all the possible adverse reactions/side effects.   Please note  You were cared for by a hospitalist during your hospital stay.  If you have any questions about your discharge medications or the care you received while you were in the hospital after you are discharged, you can call the unit and asked to speak with the hospitalist on call if the hospitalist that took care of you is not available. Once you are discharged, your primary care physician will handle any further medical issues. Please note that NO REFILLS for any discharge medications will be authorized once you are discharged, as it is imperative that you return to your primary care physician (or establish a relationship with a primary care physician if you do not have one) for your aftercare needs so that they can reassess your need for medications and monitor your lab values.    Today   CHIEF COMPLAINT:   Chief Complaint  Patient presents with  . Loss of Consciousness  . Fall  . Nausea    HISTORY OF PRESENT ILLNESS:  [name]  is a 74 y.o. male with a known history of  ischemic as well as nonischemic cardiomyopathy, history of V. tach, V. fib, status post defibrillator placement in the past who presents to the hospital with syncope and collapse. EKG done in the emergency room revealed normal sinus rhythm in 70s, possible left atrial enlargement, right pontine block, T-wave abnormality, consider lateral ischemia. The patient was noted to be dehydrated with positive orthostatic vital signs. He admitted of having very poor appetite recently, which was felt to be due to Amiodarone. He was seen by Dr. Ubaldo Glassing, cardiologist, who felt that patient's episode of syncope was very likely due to ventricular arrhythmia. Cardiologist recommended to increase patient's metoprolol XL to 50 mg daily dose. Patient's device was reprogrammed to a better sense rate down from 210 to 185. Patient did well with advanced doses of metoprolol and was felt to be stable to be discharged home with follow-up with cardiology as well as to get electrophysiology as outpatient. Of note, as workup  for patient's syncope, he underwent carotid ultrasound which revealed revealed carotid stenosis bilaterally, right more than left, but not more than 50% per radiologist.  In regards to hyperthyroidism, patient's TSH was checked, was found to be low with elevated free T4 levels. Patient was advised to continue methimazole and follow up with endocrinologist as outpatient. He was also recommended to continue higher dose of beta blocker. In regards to patient's dyspepsia. It was felt that patient's with dyspepsia could have been related to amiodarone. Discussion by problem: 1. Syncope and collapse, likely due to ventricular arrhythmia , according to cardiologist, Dr. Ubaldo Glassing. Patient's device was reprogrammed to a better sense the rate. Cardiologist recommends to continue amiodarone at current dose of 400 mg daily and advance patient's Toprol-XL to 50 mg daily, discharge him home today. 14th of August 2016 with outpatient follow-up at [facility] for possible V. tach ablation. Patient's carotid ultrasound revealed carotid stenosis  bilaterally around 50%. Patient's orthostatic vital signs were abnormal, concerning for syncope, at least partially , related to orthostatic hypotension, patient was rehydrated and his blood pressure improved. The patient was advised to drink plenty of fluids.  2 . Hyperthyroidism, likely due to amiodarone, which unfortunately we cannot discontinue yet. As mentioned above, patient will be evaluated by [facility] electrophysiology and if successful V. tach ablation amiodarone could be discontinued at cardiologist at discretion  TSH was found to be that point. Continue advanced doses of metoprolol and get endocrinology consultation and follow-up as outpatient. Continue current doses of methimazole. TSH was found to be 0.033 with free T4 4 0.56 on this admission 3. Carotid atherosclerosis with stenosis, moderate on the right and mild on the left, continue aspirin  would benefit from statin, ? Initiate as outpatient 4. Dyspepsia, possibly related to amiodarone per gastroenterologist, continue PPI and supportive therapy. Patient's oral intake somewhat improved while in the hospital    VITAL SIGNS:  Blood pressure 105/70, pulse 69, temperature 98.2 F (36.8 C), temperature source Oral, resp. rate 20, height 5' 8"  (1.727 m), weight 82.419 kg (181 lb 11.2 oz), SpO2 95 %.  I/O:   Intake/Output Summary (Last 24 hours) at 08/20/14 1339 Last data filed at 08/20/14 0830  Gross per 24 hour  Intake    120 ml  Output   2100 ml  Net  -1980 ml    PHYSICAL EXAMINATION:  GENERAL:  74 y.o.-year-old patient lying in the bed with no acute distress.  EYES: Pupils equal, round, reactive to light and accommodation. No scleral icterus. Extraocular muscles intact.  HEENT: Head atraumatic, normocephalic. Oropharynx and nasopharynx clear.  NECK:  Supple, no jugular venous distention. No thyroid enlargement, no tenderness.  LUNGS: Normal breath sounds bilaterally, no wheezing, rales,rhonchi or crepitation. No use of accessory muscles of respiration.  CARDIOVASCULAR: S1, S2 normal. No murmurs, rubs, or gallops.  ABDOMEN: Soft, non-tender, non-distended. Bowel sounds present. No organomegaly or mass.  EXTREMITIES: No pedal edema, cyanosis, or clubbing.  NEUROLOGIC: Cranial nerves II through XII are intact. Muscle strength 5/5 in all extremities. Sensation intact. Gait not checked.  PSYCHIATRIC: The patient is alert and oriented x 3.  SKIN: No obvious rash, lesion, or ulcer.   DATA REVIEW:   CBC  Recent Labs Lab 08/19/14 0411  WBC 9.8  HGB 12.9*  HCT 38.7*  PLT 155    Chemistries   Recent Labs Lab 08/18/14 2044 08/19/14 0411  NA 138 141  K 4.4 3.8  CL 103 105  CO2 24 27  GLUCOSE 104* 86  BUN 23* 19  CREATININE 1.10 0.98  CALCIUM 9.0 8.7*  MG 1.8  --     Cardiac Enzymes  Recent Labs Lab 08/19/14 0411  TROPONINI 0.03    Microbiology  Results  No results found for this or any previous visit.  RADIOLOGY:  US Carotid Bilateral  08/19/2014   CLINICAL DATA:  74 year old male with carotid artery stenosis noted on recent CTA of the neck  EXAM: BILATERAL CAROTID DUPLEX ULTRASOUND  TECHNIQUE: Pearline Cables scale imaging, color Doppler and duplex ultrasound were performed of bilateral carotid and vertebral arteries in the neck.  COMPARISON:  CTA neck 07/15/2014  FINDINGS: Criteria: Quantification of carotid stenosis is based on velocity parameters that correlate the residual internal carotid diameter with NASCET-based stenosis levels, using the diameter of the distal internal carotid lumen as the denominator for stenosis measurement.  The following velocity measurements were obtained:  RIGHT  ICA:  112/27 cm/sec  CCA:  10/27 cm/sec  SYSTOLIC ICA/CCA RATIO:  2.0  DIASTOLIC ICA/CCA RATIO:  1.9  ECA:  83 cm/sec  LEFT  ICA:  102/42 cm/sec  CCA:  25/36 cm/sec  SYSTOLIC ICA/CCA RATIO:  1.1  DIASTOLIC ICA/CCA RATIO:  1.9  ECA:  89 cm/sec  RIGHT CAROTID ARTERY: Heterogeneous atherosclerotic plaque in the proximal internal carotid artery results in focal elevation of the peak systolic velocity. By systolic ICA/ CCA ratio, there is a 50- 69% diameter stenosis. Of note, the threshold is just met for this level of stenosis.  RIGHT VERTEBRAL ARTERY:  Patent with normal antegrade flow.  LEFT CAROTID ARTERY: Trace atherosclerotic plaque which is smooth but heterogeneous. By peak systolic velocity criteria, the estimated stenosis remains less than 50%.  LEFT VERTEBRAL ARTERY:  Patent with normal antegrade flow.  IMPRESSION: 1. Moderate (50- 69%) diameter stenosis of the proximal right internal carotid artery secondary to heterogeneous atherosclerotic plaque. Of note, the findings just meets the threshold for this level of stenosis indicating the stenosis is likely on the low side of the provided range. These findings are congruent with the findings of 50% stenosis on the  recent CTA scan of the neck. 2. Mild (1-49%) diameter stenosis of the proximal left internal carotid artery secondary to heterogeneous atherosclerotic plaque. 3. Vertebral arteries are patent with normal antegrade flow. Signed,  Criselda Peaches, MD  [facility]  [facility]   Electronically Signed   By: Jacqulynn Cadet M.D.   On: 08/19/2014 11:49   Dg Chest Port 1 View  08/18/2014   CLINICAL DATA:  Syncope, fall  EXAM: PORTABLE CHEST - 1 VIEW  COMPARISON:  CTA chest dated 07/14/2014  FINDINGS: Lungs are clear.  No pleural effusion or pneumothorax.  The heart is top-normal in size. Postsurgical changes related to prior CABG. Left subclavian ICD.  IMPRESSION: No evidence of acute cardiopulmonary disease.   Electronically Signed   By: Julian Hy M.D.   On: 08/18/2014 20:48    EKG:   Orders placed or performed during the hospital encounter of 08/18/14  . EKG 12-Lead  . EKG 12-Lead      Management plans discussed with the patient, family and they are in agreement.  CODE STATUS:     Code Status Orders        Start     Ordered   08/18/14 2215  Do not attempt resuscitation (DNR)   Continuous    Question Answer Comment  In the event of cardiac or respiratory ARREST Do not call a "code blue"   In the event of cardiac or respiratory ARREST Do not perform Intubation, CPR, defibrillation or ACLS   In the event of cardiac or respiratory ARREST Use medication by any route, position, wound care, and other measures to relive pain and suffering. May use oxygen, suction and manual treatment of airway obstruction as needed for comfort.   Comments Nurse may pronounce      08/18/14 2215    Advance Directive Documentation        Most Recent Value   Type of Advance Directive  Out of facility DNR (pink MOST or yellow form)   Pre-existing out of facility DNR order (yellow form or pink MOST form)  Yellow form placed in chart (order not valid for  inpatient use)   "MOST" Form in Place?        TOTAL TIME TAKING CARE OF THIS PATIENT: 45  minutes.   Discussed with Dr. Ubaldo Glassing  Theodoro Grist M.D on 08/20/2014 at 1:39 PM  Between 7am to 6pm - Pager - [phone]  After 6pm go to [email or web address] - password [email or web address] [facility]  Office  [phone]  CC: Primary care physician; [name], MD

## 2014-08-21 ENCOUNTER — Other Ambulatory Visit: Payer: Self-pay | Admitting: Gastroenterology

## 2014-08-21 DIAGNOSIS — R1310 Dysphagia, unspecified: Secondary | ICD-10-CM

## 2014-08-21 DIAGNOSIS — R2681 Unsteadiness on feet: Secondary | ICD-10-CM | POA: Diagnosis not present

## 2014-08-21 DIAGNOSIS — H8111 Benign paroxysmal vertigo, right ear: Secondary | ICD-10-CM | POA: Diagnosis not present

## 2014-08-23 DIAGNOSIS — R2681 Unsteadiness on feet: Secondary | ICD-10-CM | POA: Diagnosis not present

## 2014-08-23 DIAGNOSIS — H8111 Benign paroxysmal vertigo, right ear: Secondary | ICD-10-CM | POA: Diagnosis not present

## 2014-08-24 ENCOUNTER — Ambulatory Visit
Admission: RE | Admit: 2014-08-24 | Discharge: 2014-08-24 | Disposition: A | Discharge status: Home / Self Care | Payer: Medicare Other | Source: Ambulatory Visit | Attending: Gastroenterology | Admitting: Gastroenterology

## 2014-08-24 DIAGNOSIS — R935 Abnormal findings on diagnostic imaging of other abdominal regions, including retroperitoneum: Secondary | ICD-10-CM

## 2014-08-24 DIAGNOSIS — K8020 Calculus of gallbladder without cholecystitis without obstruction: Secondary | ICD-10-CM | POA: Diagnosis not present

## 2014-08-24 DIAGNOSIS — R1310 Dysphagia, unspecified: Secondary | ICD-10-CM | POA: Insufficient documentation

## 2014-08-29 DIAGNOSIS — H8111 Benign paroxysmal vertigo, right ear: Secondary | ICD-10-CM | POA: Diagnosis not present

## 2014-08-29 DIAGNOSIS — R2681 Unsteadiness on feet: Secondary | ICD-10-CM | POA: Diagnosis not present

## 2014-08-30 DIAGNOSIS — H8111 Benign paroxysmal vertigo, right ear: Secondary | ICD-10-CM | POA: Diagnosis not present

## 2014-08-30 DIAGNOSIS — R2681 Unsteadiness on feet: Secondary | ICD-10-CM | POA: Diagnosis not present

## 2014-08-31 DIAGNOSIS — H8111 Benign paroxysmal vertigo, right ear: Secondary | ICD-10-CM | POA: Diagnosis not present

## 2014-08-31 DIAGNOSIS — R2681 Unsteadiness on feet: Secondary | ICD-10-CM | POA: Diagnosis not present

## 2014-09-01 DIAGNOSIS — Z4502 Encounter for adjustment and management of automatic implantable cardiac defibrillator: Secondary | ICD-10-CM | POA: Diagnosis not present

## 2014-09-01 DIAGNOSIS — Z95810 Presence of automatic (implantable) cardiac defibrillator: Secondary | ICD-10-CM | POA: Diagnosis not present

## 2014-09-01 DIAGNOSIS — I472 Ventricular tachycardia: Secondary | ICD-10-CM | POA: Diagnosis not present

## 2014-09-05 ENCOUNTER — Ambulatory Visit
Admission: RE | Admit: 2014-09-05 | Discharge: 2014-09-05 | Disposition: A | Discharge status: Home / Self Care | Payer: Medicare Other | Source: Ambulatory Visit | Attending: Gastroenterology | Admitting: Gastroenterology

## 2014-09-05 DIAGNOSIS — R6889 Other general symptoms and signs: Secondary | ICD-10-CM | POA: Diagnosis not present

## 2014-09-05 DIAGNOSIS — E0590 Thyrotoxicosis, unspecified without thyrotoxic crisis or storm: Secondary | ICD-10-CM | POA: Diagnosis not present

## 2014-09-05 DIAGNOSIS — E538 Deficiency of other specified B group vitamins: Secondary | ICD-10-CM | POA: Diagnosis not present

## 2014-09-05 DIAGNOSIS — R1310 Dysphagia, unspecified: Secondary | ICD-10-CM | POA: Diagnosis not present

## 2014-09-05 DIAGNOSIS — H8110 Benign paroxysmal vertigo, unspecified ear: Secondary | ICD-10-CM | POA: Diagnosis not present

## 2014-09-05 DIAGNOSIS — K219 Gastro-esophageal reflux disease without esophagitis: Secondary | ICD-10-CM | POA: Diagnosis not present

## 2014-09-05 NOTE — Therapy (Signed)
[place], [place], [address] Phone: [phone]   Fax:     Modified Barium Swallow  Patient Details  Name: [name] MRN: [id number] Date of Birth: 1940/04/04 Referring Provider:  Josefine Class, MD  Encounter Date: 09/05/2014   Subjective: Patient behavior: (alertness, ability to follow instructions, etc.): pt alert; oriented. Pt was able to describe his swallowing history over the past 2-3 months indicating that since he had a "fall" at home then experienced Vertigo, he has experienced N/V, reduced desire for food, and a change in his taste of foods. Pt indicated he has lost ~30 lbs in the past 2-3 months but that since the Vertigo improved after tx last week, he has been able to eat "a little more now" w/out the N/V. Pt does have a h/o GERD and is on a PPI. Pt also indicated he had Thyroid issues.  Chief complaint: dysphagia   Objective:  Radiological Procedure: A videoflouroscopic evaluation of oral-preparatory, reflex initiation, and pharyngeal phases of the swallow was performed; as well as a screening of the upper esophageal phase.  I. POSTURE: upright II. VIEW: lateral III. COMPENSATORY STRATEGIES: f/u, nonvolitional swallow when nec. IV. BOLUSES ADMINISTERED:  Thin Liquid: 6-7 trials  Nectar-thick Liquid: 1 trial  Honey-thick Liquid: NT  Puree: 3 trials  Mechanical Soft: 2 trials V. RESULTS OF EVALUATION: A. ORAL PREPARATORY PHASE: (The lips, tongue, and velum are observed for strength and coordination)       **Overall Severity Rating: WNL. Timely A-P transit; adequate bolus management and oral clearing w/ all trials.   B. SWALLOW INITIATION/REFLEX: (The reflex is normal if "triggered" by the time the bolus reached the base of the tongue)  **Overall Severity Rating: WNL. Timely pharyngeal swallow initiation at the level of the valleculae approximately w/ appropriate laryngeal  excursion and pharyngeal pressure. No laryngeal penetration or aspiration occurred.  C. PHARYNGEAL PHASE: (Pharyngeal function is normal if the bolus shows rapid, smooth, and continuous transit through the pharynx and there is no pharyngeal residue after the swallow)  **Overall Severity Rating: grossly WNL. Any slight-min. Pharyngeal residue remaining post swallow was reduced and/or cleared w/ a f/u, nonvolitional swallow by pt. No buildup of residue was noted as po trials continued.  D. LARYNGEAL PENETRATION: (Material entering into the laryngeal inlet/vestibule but not aspirated): none E. ASPIRATION: none F. ESOPHAGEAL PHASE: (Screening of the upper esophagus): no deficits noted in the cervical Esophagus (viewable)  ASSESSMENT:  Pt appeared to present w/ an adequate oropharyngeal phase swallow function w/ timely pharyngeal swallow initiation w/ all trial boluses. No laryngeal penetration or aspiration was noted during this exam. Oral phase appeared wfl w/ timely bolus management and A-P transit and clearing. During the pharyngeal phase, any slight-min. pharyngeal residue remaining post swallow(inconsistent) was reduced and/or cleared w/ a f/u, nonvolitional swallow by pt. No buildup of residue was noted as po trials continued during the study. This presentation can be related to Esophageal phase deficits including GERD/Reflux of which pt is dx'd and takes a PPI for tx. Pt indicated he often feels a "globus" feeling during meals which can also be related to Esophageal phase deficits/dysmotility. Pt indicated he was "getting his appetite back", but that he was worried about his R ear (infection) and continued sinus drainage he experiences. Rec. f/u w/ MD; possible ENT f/u as well. Reviewed general aspiration precautions; Reflux precautions.   PLAN/RECOMMENDATIONS:  A. Diet: regular  B. Swallowing Precautions: general aspiration and  Reflux precautions  C. Recommended consultation to: n/a at this  time  D. Therapy recommendations: n/a at this time  E. Results and recommendations were discussed w/ pt; viewed exam.      End of Session - 2014-09-22 1622    Visit Number 1   Number of Visits 1   Date for SLP Re-Evaluation 09-22-2014   SLP Start Time 1300   SLP Stop Time  1400   SLP Time Calculation (min) 60 min   Activity Tolerance Patient tolerated treatment well      Past Medical History  Diagnosis Date  . Leg weakness   . Balance problem   . Arthritis   . Sinus headache   . High cholesterol   . Collagen vascular disease     RA since 2014  . CHF (congestive heart failure)   . Coronary artery disease   . Myocardial infarction   . AICD (automatic cardioverter/defibrillator) present   . Dysrhythmia     Past Surgical History  Procedure Laterality Date  . Cardiac surgery    . Stents    . Heart pump    . Psychologist, forensic    . Coronary artery bypass graft    . Coronary angioplasty    . Insert / replace / remove pacemaker      There were no vitals filed for this visit.  Visit Diagnosis: Difficulty in swallowing - Plan: DG OP Swallowing Func-Medicare/Speech Path, DG OP Swallowing Func-Medicare/Speech Path                               G-Codes - September 22, 2014 1622    Functional Assessment Tool Used clinical judgement; MBSS   Functional Limitations Swallowing   Swallow Current Status (W4132) At least 1 percent but less than 20 percent impaired, limited or restricted   Swallow Goal Status (G4010) At least 1 percent but less than 20 percent impaired, limited or restricted   Swallow Discharge Status [phone]) At least 1 percent but less than 20 percent impaired, limited or restricted          Problem List Patient Active Problem List   Diagnosis Date Noted  . V-tach 08/20/2014  . Hyperthyroidism 08/20/2014  . Carotid stenosis 08/20/2014  . Dyspepsia 08/20/2014  . Malnutrition of moderate degree 08/19/2014  . Syncope and collapse 08/18/2014  . Chest  pain 07/15/2014  . CAD (coronary artery disease) 07/15/2014  . Fall at home 07/15/2014  . Nausea and vomiting 07/15/2014  . Nausea & vomiting 07/15/2014  . Abnormality of gait 12/13/2013  . Low back pain 12/13/2013  . Arthritis   . Sinus headache   . Injury of kidney 12/02/2013  . Arteriosclerosis of coronary artery 12/02/2013  . Body water dehydration 12/02/2013  . Sinus infection 12/02/2013  . Idiopathic ventricular tachycardia 09/14/2012  . Heart failure, systolic 66/44/0347  . Automatic implantable cardioverter-defibrillator in situ 07/26/2012  . Acute MI, subendocardial 07/12/2012  . Essential (primary) hypertension 07/12/2012  . HLD (hyperlipidemia) 07/12/2012  . BP (high blood pressure) 07/12/2012  . Acute non-ST segment elevation myocardial infarction 07/12/2012  . Arthritis or polyarthritis, rheumatoid 07/12/2012   Orinda Kenner, MS, CCC-SLP  Delorus Langwell 09/22/14, 4:23 PM  [facility] [place], [place], [address] Phone: [phone]   Fax:

## 2014-09-06 DIAGNOSIS — R2681 Unsteadiness on feet: Secondary | ICD-10-CM | POA: Diagnosis not present

## 2014-09-06 DIAGNOSIS — H8111 Benign paroxysmal vertigo, right ear: Secondary | ICD-10-CM | POA: Diagnosis not present

## 2014-09-13 DIAGNOSIS — M2550 Pain in unspecified joint: Secondary | ICD-10-CM | POA: Diagnosis not present

## 2014-09-13 DIAGNOSIS — R42 Dizziness and giddiness: Secondary | ICD-10-CM | POA: Diagnosis not present

## 2014-09-13 DIAGNOSIS — M0589 Other rheumatoid arthritis with rheumatoid factor of multiple sites: Secondary | ICD-10-CM | POA: Diagnosis not present

## 2014-09-13 DIAGNOSIS — R531 Weakness: Secondary | ICD-10-CM | POA: Diagnosis not present

## 2014-09-13 DIAGNOSIS — Z7952 Long term (current) use of systemic steroids: Secondary | ICD-10-CM | POA: Diagnosis not present

## 2014-09-13 DIAGNOSIS — R5382 Chronic fatigue, unspecified: Secondary | ICD-10-CM | POA: Diagnosis not present

## 2014-09-14 DIAGNOSIS — H8111 Benign paroxysmal vertigo, right ear: Secondary | ICD-10-CM | POA: Diagnosis not present

## 2014-09-14 DIAGNOSIS — R2681 Unsteadiness on feet: Secondary | ICD-10-CM | POA: Diagnosis not present

## 2014-09-19 DIAGNOSIS — R630 Anorexia: Secondary | ICD-10-CM | POA: Diagnosis not present

## 2014-09-19 DIAGNOSIS — R634 Abnormal weight loss: Secondary | ICD-10-CM | POA: Diagnosis not present

## 2014-09-20 DIAGNOSIS — R2681 Unsteadiness on feet: Secondary | ICD-10-CM | POA: Diagnosis not present

## 2014-09-20 DIAGNOSIS — H8111 Benign paroxysmal vertigo, right ear: Secondary | ICD-10-CM | POA: Diagnosis not present

## 2014-09-25 DIAGNOSIS — H8111 Benign paroxysmal vertigo, right ear: Secondary | ICD-10-CM | POA: Diagnosis not present

## 2014-09-25 DIAGNOSIS — R2681 Unsteadiness on feet: Secondary | ICD-10-CM | POA: Diagnosis not present

## 2014-09-27 DIAGNOSIS — R2681 Unsteadiness on feet: Secondary | ICD-10-CM | POA: Diagnosis not present

## 2014-09-27 DIAGNOSIS — H8111 Benign paroxysmal vertigo, right ear: Secondary | ICD-10-CM | POA: Diagnosis not present

## 2014-10-02 DIAGNOSIS — L814 Other melanin hyperpigmentation: Secondary | ICD-10-CM | POA: Diagnosis not present

## 2014-10-02 DIAGNOSIS — D485 Neoplasm of uncertain behavior of skin: Secondary | ICD-10-CM | POA: Diagnosis not present

## 2014-10-02 DIAGNOSIS — L821 Other seborrheic keratosis: Secondary | ICD-10-CM | POA: Diagnosis not present

## 2014-10-02 DIAGNOSIS — L570 Actinic keratosis: Secondary | ICD-10-CM | POA: Diagnosis not present

## 2014-10-06 DIAGNOSIS — I472 Ventricular tachycardia: Secondary | ICD-10-CM | POA: Diagnosis not present

## 2014-10-06 DIAGNOSIS — Z95810 Presence of automatic (implantable) cardiac defibrillator: Secondary | ICD-10-CM | POA: Diagnosis not present

## 2014-10-06 DIAGNOSIS — I255 Ischemic cardiomyopathy: Secondary | ICD-10-CM | POA: Diagnosis not present

## 2014-10-23 DIAGNOSIS — J301 Allergic rhinitis due to pollen: Secondary | ICD-10-CM | POA: Diagnosis not present

## 2014-10-23 DIAGNOSIS — R0982 Postnasal drip: Secondary | ICD-10-CM | POA: Diagnosis not present

## 2014-10-23 DIAGNOSIS — J328 Other chronic sinusitis: Secondary | ICD-10-CM | POA: Diagnosis not present

## 2014-10-23 DIAGNOSIS — E0590 Thyrotoxicosis, unspecified without thyrotoxic crisis or storm: Secondary | ICD-10-CM | POA: Diagnosis not present

## 2014-10-23 DIAGNOSIS — R42 Dizziness and giddiness: Secondary | ICD-10-CM | POA: Diagnosis not present

## 2014-11-07 DIAGNOSIS — J320 Chronic maxillary sinusitis: Secondary | ICD-10-CM | POA: Diagnosis not present

## 2014-11-13 DIAGNOSIS — Z7952 Long term (current) use of systemic steroids: Secondary | ICD-10-CM | POA: Diagnosis not present

## 2014-11-13 DIAGNOSIS — R531 Weakness: Secondary | ICD-10-CM | POA: Diagnosis not present

## 2014-11-13 DIAGNOSIS — R5382 Chronic fatigue, unspecified: Secondary | ICD-10-CM | POA: Diagnosis not present

## 2014-11-13 DIAGNOSIS — M0589 Other rheumatoid arthritis with rheumatoid factor of multiple sites: Secondary | ICD-10-CM | POA: Diagnosis not present

## 2014-11-13 DIAGNOSIS — H04123 Dry eye syndrome of bilateral lacrimal glands: Secondary | ICD-10-CM | POA: Diagnosis not present

## 2014-11-20 DIAGNOSIS — R42 Dizziness and giddiness: Secondary | ICD-10-CM | POA: Diagnosis not present

## 2014-11-20 DIAGNOSIS — R51 Headache: Secondary | ICD-10-CM | POA: Diagnosis not present

## 2014-11-20 DIAGNOSIS — J328 Other chronic sinusitis: Secondary | ICD-10-CM | POA: Diagnosis not present

## 2014-11-22 DIAGNOSIS — I214 Non-ST elevation (NSTEMI) myocardial infarction: Secondary | ICD-10-CM | POA: Diagnosis not present

## 2014-11-22 DIAGNOSIS — Z95810 Presence of automatic (implantable) cardiac defibrillator: Secondary | ICD-10-CM | POA: Diagnosis not present

## 2014-11-22 DIAGNOSIS — I2510 Atherosclerotic heart disease of native coronary artery without angina pectoris: Secondary | ICD-10-CM | POA: Diagnosis not present

## 2014-11-22 DIAGNOSIS — I10 Essential (primary) hypertension: Secondary | ICD-10-CM | POA: Diagnosis not present

## 2014-11-22 DIAGNOSIS — I255 Ischemic cardiomyopathy: Secondary | ICD-10-CM | POA: Diagnosis not present

## 2014-11-22 DIAGNOSIS — I472 Ventricular tachycardia: Secondary | ICD-10-CM | POA: Diagnosis not present

## 2014-11-27 ENCOUNTER — Encounter: Payer: Self-pay | Admitting: Anesthesiology

## 2014-11-29 DIAGNOSIS — I509 Heart failure, unspecified: Secondary | ICD-10-CM | POA: Diagnosis not present

## 2014-11-29 DIAGNOSIS — R6889 Other general symptoms and signs: Secondary | ICD-10-CM | POA: Diagnosis not present

## 2014-11-29 DIAGNOSIS — E538 Deficiency of other specified B group vitamins: Secondary | ICD-10-CM | POA: Diagnosis not present

## 2014-11-29 DIAGNOSIS — M6281 Muscle weakness (generalized): Secondary | ICD-10-CM | POA: Diagnosis not present

## 2014-11-29 DIAGNOSIS — E0590 Thyrotoxicosis, unspecified without thyrotoxic crisis or storm: Secondary | ICD-10-CM | POA: Diagnosis not present

## 2014-12-04 DIAGNOSIS — E039 Hypothyroidism, unspecified: Secondary | ICD-10-CM | POA: Diagnosis not present

## 2014-12-04 DIAGNOSIS — E538 Deficiency of other specified B group vitamins: Secondary | ICD-10-CM | POA: Diagnosis not present

## 2014-12-04 DIAGNOSIS — G4730 Sleep apnea, unspecified: Secondary | ICD-10-CM | POA: Diagnosis not present

## 2014-12-07 ENCOUNTER — Emergency Department
Admission: EM | Admit: 2014-12-07 | Discharge: 2014-12-07 | Disposition: A | Discharge status: Home / Self Care | Payer: Medicare Other | Attending: Emergency Medicine | Admitting: Emergency Medicine

## 2014-12-07 ENCOUNTER — Encounter: Payer: Self-pay | Admitting: *Deleted

## 2014-12-07 ENCOUNTER — Ambulatory Visit: Admit: 2014-12-07 | Payer: Medicare Other | Admitting: Otolaryngology

## 2014-12-07 DIAGNOSIS — R404 Transient alteration of awareness: Secondary | ICD-10-CM | POA: Diagnosis not present

## 2014-12-07 DIAGNOSIS — Z7982 Long term (current) use of aspirin: Secondary | ICD-10-CM | POA: Diagnosis not present

## 2014-12-07 DIAGNOSIS — R42 Dizziness and giddiness: Secondary | ICD-10-CM | POA: Diagnosis not present

## 2014-12-07 DIAGNOSIS — Z79899 Other long term (current) drug therapy: Secondary | ICD-10-CM | POA: Diagnosis not present

## 2014-12-07 DIAGNOSIS — R531 Weakness: Secondary | ICD-10-CM | POA: Diagnosis not present

## 2014-12-07 DIAGNOSIS — I10 Essential (primary) hypertension: Secondary | ICD-10-CM | POA: Insufficient documentation

## 2014-12-07 HISTORY — DX: Reserved for inherently not codable concepts without codable children: IMO0001

## 2014-12-07 HISTORY — DX: Other seasonal allergic rhinitis: J30.2

## 2014-12-07 HISTORY — DX: Dizziness and giddiness: R42

## 2014-12-07 HISTORY — DX: Chronic sinusitis, unspecified: J32.9

## 2014-12-07 HISTORY — DX: Sleep apnea, unspecified: G47.30

## 2014-12-07 HISTORY — DX: Unspecified hearing loss, unspecified ear: H91.90

## 2014-12-07 HISTORY — DX: Presence of cardiac pacemaker: Z95.0

## 2014-12-07 HISTORY — DX: Deviated nasal septum: J34.2

## 2014-12-07 LAB — COMPREHENSIVE METABOLIC PANEL
ALT: 19 U/L (ref 17–63)
ANION GAP: 7 (ref 5–15)
AST: 38 U/L (ref 15–41)
Albumin: 3.4 g/dL — ABNORMAL LOW (ref 3.5–5.0)
Alkaline Phosphatase: 57 U/L (ref 38–126)
BUN: 19 mg/dL (ref 6–20)
CHLORIDE: 105 mmol/L (ref 101–111)
CO2: 26 mmol/L (ref 22–32)
Calcium: 9 mg/dL (ref 8.9–10.3)
Creatinine, Ser: 1.17 mg/dL (ref 0.61–1.24)
GFR, EST NON AFRICAN AMERICAN: 60 mL/min — AB (ref >60)
Glucose, Bld: 140 mg/dL — ABNORMAL HIGH (ref 65–99)
POTASSIUM: 4.1 mmol/L (ref 3.5–5.1)
SODIUM: 138 mmol/L (ref 135–145)
TOTAL PROTEIN: 6.8 g/dL (ref 6.5–8.1)
Total Bilirubin: <0.1 mg/dL — ABNORMAL LOW (ref 0.3–1.2)

## 2014-12-07 LAB — CBC WITH DIFFERENTIAL/PLATELET
BASOS ABS: 0.1 10*3/uL (ref 0–0.1)
Basophils Relative: 1 %
EOS ABS: 0.4 10*3/uL (ref 0–0.7)
EOS PCT: 4 %
HCT: 39.5 % — ABNORMAL LOW (ref 40.0–52.0)
Hemoglobin: 13.1 g/dL (ref 13.0–18.0)
LYMPHS ABS: 1.7 10*3/uL (ref 1.0–3.6)
Lymphocytes Relative: 17 %
MCH: 30.6 pg (ref 26.0–34.0)
MCHC: 33.1 g/dL (ref 32.0–36.0)
MCV: 92.3 fL (ref 80.0–100.0)
MONO ABS: 1.1 10*3/uL — AB (ref 0.2–1.0)
Monocytes Relative: 12 %
Neutro Abs: 6.5 10*3/uL (ref 1.4–6.5)
Neutrophils Relative %: 66 %
PLATELETS: 177 10*3/uL (ref 150–440)
RBC: 4.28 MIL/uL — AB (ref 4.40–5.90)
RDW: 16 % — AB (ref 11.5–14.5)
WBC: 9.7 10*3/uL (ref 3.8–10.6)

## 2014-12-07 LAB — TROPONIN I

## 2014-12-07 SURGERY — SINUS SURGERY, WITH IMAGING GUIDANCE
Anesthesia: General

## 2014-12-07 NOTE — Discharge Instructions (Signed)
Please seek medical attention for any high fevers, chest pain, shortness of breath, change in behavior, persistent vomiting, bloody stool or any other new or concerning symptoms. ° ° °Dizziness °Dizziness is a common problem. It is a feeling of unsteadiness or light-headedness. You may feel like you are about to faint. Dizziness can lead to injury if you stumble or fall. Anyone can become dizzy, but dizziness is more common in older adults. This condition can be caused by a number of things, including medicines, dehydration, or illness. °HOME CARE INSTRUCTIONS °Taking these steps may help with your condition: °Eating and Drinking °· Drink enough fluid to keep your urine clear or pale yellow. This helps to keep you from becoming dehydrated. Try to drink more clear fluids, such as water. °· Do not drink alcohol. °· Limit your caffeine intake if directed by your health care provider. °· Limit your salt intake if directed by your health care provider. °Activity °· Avoid making quick movements. °¨ Rise slowly from chairs and steady yourself until you feel okay. °¨ In the morning, first sit up on the side of the bed. When you feel okay, stand slowly while you hold onto something until you know that your balance is fine. °· Move your legs often if you need to stand in one place for a long time. Tighten and relax your muscles in your legs while you are standing. °· Do not drive or operate heavy machinery if you feel dizzy. °· Avoid bending down if you feel dizzy. Place items in your home so that they are easy for you to reach without leaning over. °Lifestyle °· Do not use any tobacco products, including cigarettes, chewing tobacco, or electronic cigarettes. If you need help quitting, ask your health care provider. °· Try to reduce your stress level, such as with yoga or meditation. Talk with your health care provider if you need help. °General Instructions °· Watch your dizziness for any changes. °· Take medicines only as  directed by your health care provider. Talk with your health care provider if you think that your dizziness is caused by a medicine that you are taking. °· Tell a friend or a family member that you are feeling dizzy. If he or she notices any changes in your behavior, have this person call your health care provider. °· Keep all follow-up visits as directed by your health care provider. This is important. °SEEK MEDICAL CARE IF: °· Your dizziness does not go away. °· Your dizziness or light-headedness gets worse. °· You feel nauseous. °· You have reduced hearing. °· You have new symptoms. °· You are unsteady on your feet or you feel like the room is spinning. °SEEK IMMEDIATE MEDICAL CARE IF: °· You vomit or have diarrhea and are unable to eat or drink anything. °· You have problems talking, walking, swallowing, or using your arms, hands, or legs. °· You feel generally weak. °· You are not thinking clearly or you have trouble forming sentences. It may take a friend or family member to notice this. °· You have chest pain, abdominal pain, shortness of breath, or sweating. °· Your vision changes. °· You notice any bleeding. °· You have a headache. °· You have neck pain or a stiff neck. °· You have a fever. °  °This information is not intended to replace advice given to you by your health care provider. Make sure you discuss any questions you have with your health care provider. °  °Document Released: 06/18/2000 Document Revised: 05/09/2014   Document Reviewed: 12/19/2013 °Elsevier Interactive Patient Education ©2016 [employer] Inc[employer] ° °

## 2014-12-07 NOTE — ED Provider Notes (Signed)
[facility] Emergency Department Provider Note    ____________________________________________  Time seen: 1145  I have reviewed the triage vital signs and the nursing notes.   HISTORY  Chief Complaint Weakness   History limited by: Not Limited   HPI [name] is a 74 y.o. male who presents to the emergency department today after an episode of feeling dizzy and weak. The patient states that he woke up this morning feeling weak and dizzy. He states this is his typical mornings. He states he has been feeling weak and dizzy in the mornings since this summer when he fell and hit his head. He states he has seen multiple doctors for these symptoms. He states that he has been seen by ENT and they think that a lot to be secondary to vertigo. Additionally has seen Dr. Satira Mccallum with cardiology for these symptoms. He states today he felt like it was a little worse [place] morning. His friends were concerned when he met them at dinner. He states at the time of my examination he feels much better. He denies any associated chest pain or shortness breath. Denies any recent fevers. Denies any palpitations.   Past Medical History  Diagnosis Date  . Leg weakness   . Balance problem   . High cholesterol   . Collagen vascular disease ([place])     RA since 2014  . CHF (congestive heart failure) ([place])   . Coronary artery disease   . Myocardial infarction ([place])   . Dysrhythmia   . Hearing loss     TINNITUS  . Vertigo     DYSEQUILIBRIUM  . Deviated nasal septum   . Seasonal allergies     RHINITIS  . Sinusitis     CHRONIC  . Sleep apnea   . Shortness of breath dyspnea     COUGH  . Arthritis     RA  . Presence of permanent cardiac pacemaker     WITH DEFIBRILATOR ([employer])  . Sinus headache   . AICD (automatic cardioverter/defibrillator) present     [employer]    Patient Active Problem List   Diagnosis Date Noted  . V-tach ([place]) 08/20/2014  .  Hyperthyroidism 08/20/2014  . Carotid stenosis 08/20/2014  . Dyspepsia 08/20/2014  . Malnutrition of moderate degree ([place]) 08/19/2014  . Syncope and collapse 08/18/2014  . Chest pain 07/15/2014  . CAD (coronary artery disease) 07/15/2014  . Fall at home 07/15/2014  . Nausea and vomiting 07/15/2014  . Nausea & vomiting 07/15/2014  . Abnormality of gait 12/13/2013  . Low back pain 12/13/2013  . Arthritis   . Sinus headache   . Injury of kidney 12/02/2013  . Arteriosclerosis of coronary artery 12/02/2013  . Body water dehydration 12/02/2013  . Sinus infection 12/02/2013  . Idiopathic ventricular tachycardia ([place]) 09/14/2012  . Heart failure, systolic ([place]) 91/66/0600  . Automatic implantable cardioverter-defibrillator in situ 07/26/2012  . Acute MI, subendocardial ([place]) 07/12/2012  . Essential (primary) hypertension 07/12/2012  . HLD (hyperlipidemia) 07/12/2012  . BP (high blood pressure) 07/12/2012  . Acute non-ST segment elevation myocardial infarction ([place]) 07/12/2012  . Arthritis or polyarthritis, rheumatoid ([place]) 07/12/2012    Past Surgical History  Procedure Laterality Date  . Cardiac surgery    . Heart pump    . Insert / replace / remove pacemaker    . Coronary artery bypass graft  1992  . Coronary angioplasty      STENTS  . Septoplasty    . Cataract  extraction      BILATERAL    Current Outpatient Rx  Name  Route  Sig  Dispense  Refill  . amiodarone (PACERONE) 400 MG tablet   Oral   Take 400 mg by mouth daily.         [name] aspirin EC 81 MG tablet   Oral   Take 81 mg by mouth daily.         . cetirizine (ZYRTEC) 10 MG tablet   Oral   Take 10 mg by mouth daily.         . Cholecalciferol (VITAMIN D3) 2000 UNITS TABS   Oral   Take 2,000 Units by mouth daily.          [name] docusate sodium (COLACE) 100 MG capsule   Oral   Take 100 mg by mouth every evening.         . fluticasone (FLONASE) 50 MCG/ACT nasal spray   Each Nare   Place 1 spray into both  nostrils daily as needed for rhinitis.          . furosemide (LASIX) 40 MG tablet   Oral   Take 40 mg by mouth daily as needed for edema.         . metoprolol succinate (TOPROL-XL) 25 MG 24 hr tablet   Oral   Take 25 mg by mouth 2 (two) times daily. Take 63m every morning and 261mevery evening.         . mexiletine (MEXITIL) 150 MG capsule   Oral   Take 150 mg by mouth 2 (two) times daily.         . nitroGLYCERIN (NITROSTAT) 0.4 MG SL tablet   Sublingual   Place 0.4 mg under the tongue every 5 (five) minutes as needed for chest pain.         . Marland Kitchenpironolactone (ALDACTONE) 25 MG tablet   Oral   Take 25 mg by mouth daily.         . Marland Kitchencetaminophen (TYLENOL) 325 MG tablet   Oral   Take 2 tablets (650 mg total) by mouth every 6 (six) hours as needed for mild pain (or Fever >/= 101).           Allergies Pravastatin  Family History  Problem Relation Age of Onset  . Alzheimer's disease Mother     Social History Social History  Substance Use Topics  . Smoking status: Never Smoker   . Smokeless tobacco: Never Used  . Alcohol Use: No    Review of Systems  Constitutional: Negative for fever. Cardiovascular: Negative for chest pain. Respiratory: Negative for shortness of breath. Gastrointestinal: Negative for abdominal pain, vomiting and diarrhea. Genitourinary: Negative for dysuria. Musculoskeletal: Negative for back pain. Skin: Negative for rash. Neurological: Negative for headaches, focal weakness or numbness. 10-point ROS otherwise negative.  ____________________________________________   PHYSICAL EXAM:  VITAL SIGNS: ED Triage Vitals  Enc Vitals Group     BP 12/07/14 1023 148/110 mmHg     Pulse Rate 12/07/14 1023 56     Resp 12/07/14 1023 18     Temp 12/07/14 1023 97.6 F (36.4 C)     Temp Source 12/07/14 1023 Oral     SpO2 12/07/14 1023 96 %     Weight 12/07/14 1023 190 lb (86.183 kg)     Height 12/07/14 1023 5' 8"  (1.727 m)    Constitutional: Alert and oriented. Well appearing and in no distress. Eyes: Conjunctivae are normal. PERRL. Normal extraocular movements.  ENT   Head: Normocephalic and atraumatic.   Nose: No congestion/rhinnorhea.   Mouth/Throat: Mucous membranes are moist.   Neck: No stridor. Hematological/Lymphatic/Immunilogical: No cervical lymphadenopathy. Cardiovascular: Normal rate, regular rhythm.  No murmurs, rubs, or gallops. Respiratory: Normal respiratory effort without tachypnea nor retractions. Breath sounds are clear and equal bilaterally. No wheezes/rales/rhonchi. Gastrointestinal: Soft and nontender. No distention. There is no CVA tenderness. Genitourinary: Deferred Musculoskeletal: Normal range of motion in all extremities. No joint effusions.  No lower extremity tenderness nor edema. Neurologic:  Normal speech and language. No gross focal neurologic deficits are appreciated.  Skin:  Skin is warm, dry and intact. No rash noted. Psychiatric: Mood and affect are normal. Speech and behavior are normal. Patient exhibits appropriate insight and judgment.  ____________________________________________    LABS (pertinent positives/negatives)  Labs Reviewed  CBC WITH DIFFERENTIAL/PLATELET - Abnormal; Notable for the following:    RBC 4.28 (*)    HCT 39.5 (*)    RDW 16.0 (*)    Monocytes Absolute 1.1 (*)    All other components within normal limits  COMPREHENSIVE METABOLIC PANEL - Abnormal; Notable for the following:    Glucose, Bld 140 (*)    Albumin 3.4 (*)    Total Bilirubin <0.1 (*)    GFR calc non Af Amer 60 (*)    All other components within normal limits  TROPONIN I     ____________________________________________   EKG  I, Nance Pear, attending physician, personally viewed and interpreted this EKG  EKG Time: 1019 Rate: 57 Rhythm: sinus bradycardia Axis: normal Intervals: qtc 497 QRS: RBBB ST changes: no st elevation Impression: abnormal  ekg ____________________________________________    RADIOLOGY  None   ____________________________________________   PROCEDURES  Procedure(s) performed: None  Critical Care performed: No  ____________________________________________   INITIAL IMPRESSION / ASSESSMENT AND PLAN / ED COURSE  Pertinent labs & imaging results that were available during my care of the patient were reviewed by me and considered in my medical decision making (see chart for details).  Patient presented to the emergency department today after an episode of feeling weak and dizzy. Percent blood work normal. Patient states he has been seen by multiple doctors and specialists for this problem. I did recommend that the patient stay for a second troponin. Patient however declined. I did discuss that it is still possible that his heart was causing the symptoms. I did advise the patient to follow-up with his cardiologist. Patient verbalizes understanding.  ____________________________________________   FINAL CLINICAL IMPRESSION(S) / ED DIAGNOSES  Final diagnoses:  Dizziness     Nance Pear, MD 12/07/14 1254

## 2014-12-07 NOTE — ED Notes (Signed)
Pt arrives via EMS, pt states he woke up this AM at 0700 and felt weak, pt proceded to go to breakfast where his friends noticed he was weak, pale, and confused, EMs reports initial BP of 80/40 with pt pale, clammy, weak, and confused,  Gave 400 NS bolus and pressure rose to 118/76, states hx of CABG and pacemaker/ ICD, pt arrives awake and alert but states he "feels crappy and weak"

## 2015-01-12 DIAGNOSIS — I5020 Unspecified systolic (congestive) heart failure: Secondary | ICD-10-CM | POA: Diagnosis not present

## 2015-01-12 DIAGNOSIS — I472 Ventricular tachycardia: Secondary | ICD-10-CM | POA: Diagnosis not present

## 2015-01-12 DIAGNOSIS — Z4502 Encounter for adjustment and management of automatic implantable cardiac defibrillator: Secondary | ICD-10-CM | POA: Diagnosis not present

## 2015-01-12 DIAGNOSIS — I2510 Atherosclerotic heart disease of native coronary artery without angina pectoris: Secondary | ICD-10-CM | POA: Diagnosis not present

## 2015-02-12 DIAGNOSIS — Z7952 Long term (current) use of systemic steroids: Secondary | ICD-10-CM | POA: Diagnosis not present

## 2015-02-12 DIAGNOSIS — M0589 Other rheumatoid arthritis with rheumatoid factor of multiple sites: Secondary | ICD-10-CM | POA: Diagnosis not present

## 2015-02-12 DIAGNOSIS — H04123 Dry eye syndrome of bilateral lacrimal glands: Secondary | ICD-10-CM | POA: Diagnosis not present

## 2015-02-12 DIAGNOSIS — R531 Weakness: Secondary | ICD-10-CM | POA: Diagnosis not present

## 2015-02-12 DIAGNOSIS — R5382 Chronic fatigue, unspecified: Secondary | ICD-10-CM | POA: Diagnosis not present

## 2015-02-13 ENCOUNTER — Other Ambulatory Visit: Payer: Self-pay | Admitting: Physician Assistant

## 2015-02-13 DIAGNOSIS — Z7952 Long term (current) use of systemic steroids: Secondary | ICD-10-CM

## 2015-02-15 DIAGNOSIS — I214 Non-ST elevation (NSTEMI) myocardial infarction: Secondary | ICD-10-CM | POA: Diagnosis not present

## 2015-02-15 DIAGNOSIS — I255 Ischemic cardiomyopathy: Secondary | ICD-10-CM | POA: Diagnosis not present

## 2015-02-15 DIAGNOSIS — Z95810 Presence of automatic (implantable) cardiac defibrillator: Secondary | ICD-10-CM | POA: Diagnosis not present

## 2015-02-15 DIAGNOSIS — I472 Ventricular tachycardia: Secondary | ICD-10-CM | POA: Diagnosis not present

## 2015-02-28 DIAGNOSIS — J309 Allergic rhinitis, unspecified: Secondary | ICD-10-CM | POA: Diagnosis not present

## 2015-02-28 DIAGNOSIS — I509 Heart failure, unspecified: Secondary | ICD-10-CM | POA: Diagnosis not present

## 2015-02-28 DIAGNOSIS — M069 Rheumatoid arthritis, unspecified: Secondary | ICD-10-CM | POA: Diagnosis not present

## 2015-02-28 DIAGNOSIS — I2510 Atherosclerotic heart disease of native coronary artery without angina pectoris: Secondary | ICD-10-CM | POA: Diagnosis not present

## 2015-02-28 DIAGNOSIS — R6889 Other general symptoms and signs: Secondary | ICD-10-CM | POA: Diagnosis not present

## 2015-02-28 DIAGNOSIS — E039 Hypothyroidism, unspecified: Secondary | ICD-10-CM | POA: Diagnosis not present

## 2015-02-28 DIAGNOSIS — E538 Deficiency of other specified B group vitamins: Secondary | ICD-10-CM | POA: Diagnosis not present

## 2015-04-11 DIAGNOSIS — R42 Dizziness and giddiness: Secondary | ICD-10-CM | POA: Diagnosis not present

## 2015-04-11 DIAGNOSIS — E039 Hypothyroidism, unspecified: Secondary | ICD-10-CM | POA: Diagnosis not present

## 2015-04-11 DIAGNOSIS — I509 Heart failure, unspecified: Secondary | ICD-10-CM | POA: Diagnosis not present

## 2015-04-11 DIAGNOSIS — M069 Rheumatoid arthritis, unspecified: Secondary | ICD-10-CM | POA: Diagnosis not present

## 2015-04-11 DIAGNOSIS — R6889 Other general symptoms and signs: Secondary | ICD-10-CM | POA: Diagnosis not present

## 2015-04-18 DIAGNOSIS — Z7952 Long term (current) use of systemic steroids: Secondary | ICD-10-CM | POA: Diagnosis not present

## 2015-04-25 DIAGNOSIS — H04123 Dry eye syndrome of bilateral lacrimal glands: Secondary | ICD-10-CM | POA: Diagnosis not present

## 2015-04-25 DIAGNOSIS — H35032 Hypertensive retinopathy, left eye: Secondary | ICD-10-CM | POA: Diagnosis not present

## 2015-04-25 DIAGNOSIS — H40013 Open angle with borderline findings, low risk, bilateral: Secondary | ICD-10-CM | POA: Diagnosis not present

## 2015-04-25 DIAGNOSIS — H01003 Unspecified blepharitis right eye, unspecified eyelid: Secondary | ICD-10-CM | POA: Diagnosis not present

## 2015-04-25 DIAGNOSIS — H35031 Hypertensive retinopathy, right eye: Secondary | ICD-10-CM | POA: Diagnosis not present

## 2015-04-25 DIAGNOSIS — Z961 Presence of intraocular lens: Secondary | ICD-10-CM | POA: Diagnosis not present

## 2015-04-30 DIAGNOSIS — C44309 Unspecified malignant neoplasm of skin of other parts of face: Secondary | ICD-10-CM | POA: Diagnosis not present

## 2015-04-30 DIAGNOSIS — C44609 Unspecified malignant neoplasm of skin of left upper limb, including shoulder: Secondary | ICD-10-CM | POA: Diagnosis not present

## 2015-05-16 DIAGNOSIS — Z7952 Long term (current) use of systemic steroids: Secondary | ICD-10-CM | POA: Diagnosis not present

## 2015-05-16 DIAGNOSIS — H04123 Dry eye syndrome of bilateral lacrimal glands: Secondary | ICD-10-CM | POA: Diagnosis not present

## 2015-05-16 DIAGNOSIS — R5382 Chronic fatigue, unspecified: Secondary | ICD-10-CM | POA: Diagnosis not present

## 2015-05-16 DIAGNOSIS — M0589 Other rheumatoid arthritis with rheumatoid factor of multiple sites: Secondary | ICD-10-CM | POA: Diagnosis not present

## 2015-05-16 DIAGNOSIS — R531 Weakness: Secondary | ICD-10-CM | POA: Diagnosis not present

## 2015-05-23 DIAGNOSIS — I214 Non-ST elevation (NSTEMI) myocardial infarction: Secondary | ICD-10-CM | POA: Diagnosis not present

## 2015-05-23 DIAGNOSIS — I2510 Atherosclerotic heart disease of native coronary artery without angina pectoris: Secondary | ICD-10-CM | POA: Diagnosis not present

## 2015-05-23 DIAGNOSIS — I255 Ischemic cardiomyopathy: Secondary | ICD-10-CM | POA: Diagnosis not present

## 2015-05-23 DIAGNOSIS — Z95810 Presence of automatic (implantable) cardiac defibrillator: Secondary | ICD-10-CM | POA: Diagnosis not present

## 2015-05-23 DIAGNOSIS — I472 Ventricular tachycardia: Secondary | ICD-10-CM | POA: Diagnosis not present

## 2015-05-23 DIAGNOSIS — I10 Essential (primary) hypertension: Secondary | ICD-10-CM | POA: Diagnosis not present

## 2015-06-25 DIAGNOSIS — H40013 Open angle with borderline findings, low risk, bilateral: Secondary | ICD-10-CM | POA: Diagnosis not present

## 2015-06-25 DIAGNOSIS — H04123 Dry eye syndrome of bilateral lacrimal glands: Secondary | ICD-10-CM | POA: Diagnosis not present

## 2015-07-18 DIAGNOSIS — M069 Rheumatoid arthritis, unspecified: Secondary | ICD-10-CM | POA: Diagnosis not present

## 2015-07-18 DIAGNOSIS — R6889 Other general symptoms and signs: Secondary | ICD-10-CM | POA: Diagnosis not present

## 2015-07-18 DIAGNOSIS — I509 Heart failure, unspecified: Secondary | ICD-10-CM | POA: Diagnosis not present

## 2015-07-18 DIAGNOSIS — Z1389 Encounter for screening for other disorder: Secondary | ICD-10-CM | POA: Diagnosis not present

## 2015-07-18 DIAGNOSIS — E538 Deficiency of other specified B group vitamins: Secondary | ICD-10-CM | POA: Diagnosis not present

## 2015-07-18 DIAGNOSIS — E039 Hypothyroidism, unspecified: Secondary | ICD-10-CM | POA: Diagnosis not present

## 2015-08-09 DIAGNOSIS — R5382 Chronic fatigue, unspecified: Secondary | ICD-10-CM | POA: Diagnosis not present

## 2015-08-09 DIAGNOSIS — R531 Weakness: Secondary | ICD-10-CM | POA: Diagnosis not present

## 2015-08-09 DIAGNOSIS — H04123 Dry eye syndrome of bilateral lacrimal glands: Secondary | ICD-10-CM | POA: Diagnosis not present

## 2015-08-09 DIAGNOSIS — M0589 Other rheumatoid arthritis with rheumatoid factor of multiple sites: Secondary | ICD-10-CM | POA: Diagnosis not present

## 2015-08-09 DIAGNOSIS — Z7952 Long term (current) use of systemic steroids: Secondary | ICD-10-CM | POA: Diagnosis not present

## 2015-08-15 DIAGNOSIS — Z8679 Personal history of other diseases of the circulatory system: Secondary | ICD-10-CM | POA: Diagnosis not present

## 2015-08-15 DIAGNOSIS — Z79899 Other long term (current) drug therapy: Secondary | ICD-10-CM | POA: Diagnosis not present

## 2015-08-15 DIAGNOSIS — I2510 Atherosclerotic heart disease of native coronary artery without angina pectoris: Secondary | ICD-10-CM | POA: Diagnosis not present

## 2015-08-15 DIAGNOSIS — I472 Ventricular tachycardia: Secondary | ICD-10-CM | POA: Diagnosis not present

## 2015-08-15 DIAGNOSIS — Z95810 Presence of automatic (implantable) cardiac defibrillator: Secondary | ICD-10-CM | POA: Diagnosis not present

## 2015-08-15 DIAGNOSIS — I255 Ischemic cardiomyopathy: Secondary | ICD-10-CM | POA: Diagnosis not present

## 2015-08-15 DIAGNOSIS — I10 Essential (primary) hypertension: Secondary | ICD-10-CM | POA: Diagnosis not present

## 2015-08-22 DIAGNOSIS — Z79899 Other long term (current) drug therapy: Secondary | ICD-10-CM | POA: Diagnosis not present

## 2015-10-29 DIAGNOSIS — R6889 Other general symptoms and signs: Secondary | ICD-10-CM | POA: Diagnosis not present

## 2015-10-29 DIAGNOSIS — E039 Hypothyroidism, unspecified: Secondary | ICD-10-CM | POA: Diagnosis not present

## 2015-10-29 DIAGNOSIS — M069 Rheumatoid arthritis, unspecified: Secondary | ICD-10-CM | POA: Diagnosis not present

## 2015-10-29 DIAGNOSIS — Z0000 Encounter for general adult medical examination without abnormal findings: Secondary | ICD-10-CM | POA: Diagnosis not present

## 2015-10-29 DIAGNOSIS — Z1389 Encounter for screening for other disorder: Secondary | ICD-10-CM | POA: Diagnosis not present

## 2015-10-29 DIAGNOSIS — E538 Deficiency of other specified B group vitamins: Secondary | ICD-10-CM | POA: Diagnosis not present

## 2015-10-29 DIAGNOSIS — I2510 Atherosclerotic heart disease of native coronary artery without angina pectoris: Secondary | ICD-10-CM | POA: Diagnosis not present

## 2015-11-14 DIAGNOSIS — E785 Hyperlipidemia, unspecified: Secondary | ICD-10-CM | POA: Diagnosis not present

## 2015-11-14 DIAGNOSIS — Z7952 Long term (current) use of systemic steroids: Secondary | ICD-10-CM | POA: Diagnosis not present

## 2015-11-14 DIAGNOSIS — I472 Ventricular tachycardia: Secondary | ICD-10-CM | POA: Diagnosis not present

## 2015-11-14 DIAGNOSIS — I10 Essential (primary) hypertension: Secondary | ICD-10-CM | POA: Diagnosis not present

## 2015-11-14 DIAGNOSIS — H04123 Dry eye syndrome of bilateral lacrimal glands: Secondary | ICD-10-CM | POA: Diagnosis not present

## 2015-11-14 DIAGNOSIS — M5136 Other intervertebral disc degeneration, lumbar region: Secondary | ICD-10-CM | POA: Diagnosis not present

## 2015-11-14 DIAGNOSIS — I4891 Unspecified atrial fibrillation: Secondary | ICD-10-CM | POA: Diagnosis not present

## 2015-11-14 DIAGNOSIS — I255 Ischemic cardiomyopathy: Secondary | ICD-10-CM | POA: Diagnosis not present

## 2015-11-14 DIAGNOSIS — R5382 Chronic fatigue, unspecified: Secondary | ICD-10-CM | POA: Diagnosis not present

## 2015-11-14 DIAGNOSIS — M150 Primary generalized (osteo)arthritis: Secondary | ICD-10-CM | POA: Diagnosis not present

## 2015-11-14 DIAGNOSIS — M7989 Other specified soft tissue disorders: Secondary | ICD-10-CM | POA: Diagnosis not present

## 2015-11-14 DIAGNOSIS — M0589 Other rheumatoid arthritis with rheumatoid factor of multiple sites: Secondary | ICD-10-CM | POA: Diagnosis not present

## 2015-11-14 DIAGNOSIS — R531 Weakness: Secondary | ICD-10-CM | POA: Diagnosis not present

## 2015-11-21 DIAGNOSIS — L928 Other granulomatous disorders of the skin and subcutaneous tissue: Secondary | ICD-10-CM | POA: Diagnosis not present

## 2015-11-21 DIAGNOSIS — L570 Actinic keratosis: Secondary | ICD-10-CM | POA: Diagnosis not present

## 2015-11-28 DIAGNOSIS — I2510 Atherosclerotic heart disease of native coronary artery without angina pectoris: Secondary | ICD-10-CM | POA: Diagnosis not present

## 2015-11-28 DIAGNOSIS — J0110 Acute frontal sinusitis, unspecified: Secondary | ICD-10-CM | POA: Diagnosis not present

## 2016-01-22 DIAGNOSIS — I255 Ischemic cardiomyopathy: Secondary | ICD-10-CM | POA: Diagnosis not present

## 2016-01-22 DIAGNOSIS — H903 Sensorineural hearing loss, bilateral: Secondary | ICD-10-CM | POA: Diagnosis not present

## 2016-01-22 DIAGNOSIS — R531 Weakness: Secondary | ICD-10-CM | POA: Diagnosis not present

## 2016-01-22 DIAGNOSIS — R42 Dizziness and giddiness: Secondary | ICD-10-CM | POA: Diagnosis not present

## 2016-01-22 DIAGNOSIS — Z95810 Presence of automatic (implantable) cardiac defibrillator: Secondary | ICD-10-CM | POA: Diagnosis not present

## 2016-01-22 DIAGNOSIS — M129 Arthropathy, unspecified: Secondary | ICD-10-CM | POA: Diagnosis not present

## 2016-01-22 DIAGNOSIS — I2510 Atherosclerotic heart disease of native coronary artery without angina pectoris: Secondary | ICD-10-CM | POA: Diagnosis not present

## 2016-01-22 DIAGNOSIS — W19XXXS Unspecified fall, sequela: Secondary | ICD-10-CM | POA: Diagnosis not present

## 2016-02-21 DIAGNOSIS — H04123 Dry eye syndrome of bilateral lacrimal glands: Secondary | ICD-10-CM | POA: Diagnosis not present

## 2016-02-21 DIAGNOSIS — E669 Obesity, unspecified: Secondary | ICD-10-CM | POA: Diagnosis not present

## 2016-02-21 DIAGNOSIS — M5136 Other intervertebral disc degeneration, lumbar region: Secondary | ICD-10-CM | POA: Diagnosis not present

## 2016-02-21 DIAGNOSIS — R531 Weakness: Secondary | ICD-10-CM | POA: Diagnosis not present

## 2016-02-21 DIAGNOSIS — M150 Primary generalized (osteo)arthritis: Secondary | ICD-10-CM | POA: Diagnosis not present

## 2016-02-21 DIAGNOSIS — Z6830 Body mass index (BMI) 30.0-30.9, adult: Secondary | ICD-10-CM | POA: Diagnosis not present

## 2016-02-21 DIAGNOSIS — M0589 Other rheumatoid arthritis with rheumatoid factor of multiple sites: Secondary | ICD-10-CM | POA: Diagnosis not present

## 2016-02-21 DIAGNOSIS — R5382 Chronic fatigue, unspecified: Secondary | ICD-10-CM | POA: Diagnosis not present

## 2016-02-21 DIAGNOSIS — Z7952 Long term (current) use of systemic steroids: Secondary | ICD-10-CM | POA: Diagnosis not present

## 2016-02-25 DIAGNOSIS — R05 Cough: Secondary | ICD-10-CM | POA: Diagnosis not present

## 2016-02-25 DIAGNOSIS — R509 Fever, unspecified: Secondary | ICD-10-CM | POA: Diagnosis not present

## 2016-02-25 DIAGNOSIS — R0602 Shortness of breath: Secondary | ICD-10-CM | POA: Diagnosis not present

## 2016-02-25 DIAGNOSIS — M069 Rheumatoid arthritis, unspecified: Secondary | ICD-10-CM | POA: Diagnosis not present

## 2016-03-13 DIAGNOSIS — I214 Non-ST elevation (NSTEMI) myocardial infarction: Secondary | ICD-10-CM | POA: Diagnosis not present

## 2016-03-13 DIAGNOSIS — I2510 Atherosclerotic heart disease of native coronary artery without angina pectoris: Secondary | ICD-10-CM | POA: Diagnosis not present

## 2016-03-13 DIAGNOSIS — I10 Essential (primary) hypertension: Secondary | ICD-10-CM | POA: Diagnosis not present

## 2016-03-13 DIAGNOSIS — Z95810 Presence of automatic (implantable) cardiac defibrillator: Secondary | ICD-10-CM | POA: Diagnosis not present

## 2016-03-13 DIAGNOSIS — I472 Ventricular tachycardia: Secondary | ICD-10-CM | POA: Diagnosis not present

## 2016-03-13 DIAGNOSIS — E785 Hyperlipidemia, unspecified: Secondary | ICD-10-CM | POA: Diagnosis not present

## 2016-03-13 DIAGNOSIS — I255 Ischemic cardiomyopathy: Secondary | ICD-10-CM | POA: Diagnosis not present

## 2016-03-31 DIAGNOSIS — G4730 Sleep apnea, unspecified: Secondary | ICD-10-CM | POA: Diagnosis not present

## 2016-03-31 DIAGNOSIS — M069 Rheumatoid arthritis, unspecified: Secondary | ICD-10-CM | POA: Diagnosis not present

## 2016-03-31 DIAGNOSIS — I509 Heart failure, unspecified: Secondary | ICD-10-CM | POA: Diagnosis not present

## 2016-03-31 DIAGNOSIS — E039 Hypothyroidism, unspecified: Secondary | ICD-10-CM | POA: Diagnosis not present

## 2016-03-31 DIAGNOSIS — E538 Deficiency of other specified B group vitamins: Secondary | ICD-10-CM | POA: Diagnosis not present

## 2016-03-31 DIAGNOSIS — R7303 Prediabetes: Secondary | ICD-10-CM | POA: Diagnosis not present

## 2016-03-31 DIAGNOSIS — R739 Hyperglycemia, unspecified: Secondary | ICD-10-CM | POA: Diagnosis not present

## 2016-03-31 DIAGNOSIS — I2510 Atherosclerotic heart disease of native coronary artery without angina pectoris: Secondary | ICD-10-CM | POA: Diagnosis not present

## 2016-03-31 DIAGNOSIS — Z1389 Encounter for screening for other disorder: Secondary | ICD-10-CM | POA: Diagnosis not present

## 2016-03-31 DIAGNOSIS — M6281 Muscle weakness (generalized): Secondary | ICD-10-CM | POA: Diagnosis not present

## 2016-03-31 DIAGNOSIS — R6889 Other general symptoms and signs: Secondary | ICD-10-CM | POA: Diagnosis not present

## 2016-04-17 IMAGING — RF DG SWALLOWING FUNCTION
13 of 15 series · 13 of 24 positions shown · non-contrast
Comparison: None.

CLINICAL DATA: Dysphagia with solid material

EXAM:
MODIFIED BARIUM SWALLOW
TECHNIQUE: Different consistencies of barium were administered orally to the
patient by the Speech Pathologist. Imaging of the pharynx was
performed in the lateral projection.
FLUOROSCOPY TIME:  Fluoroscopy Time:  1 minutes 36 seconds
Number of Acquired Images:  1

[Series 1: run · 1 of 104 frames shown (1 of 13)]
[frame 16/104]
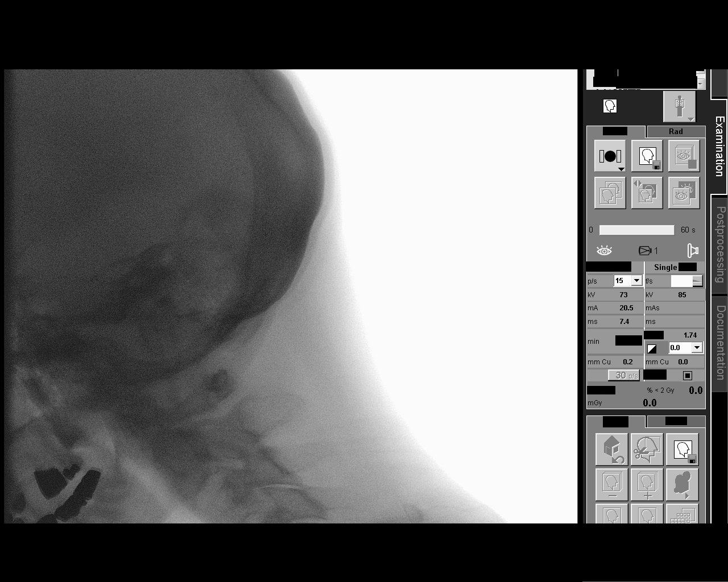

[Series 2: run · 1 of 15 frames shown (2 of 13)]
[frame 8/15]
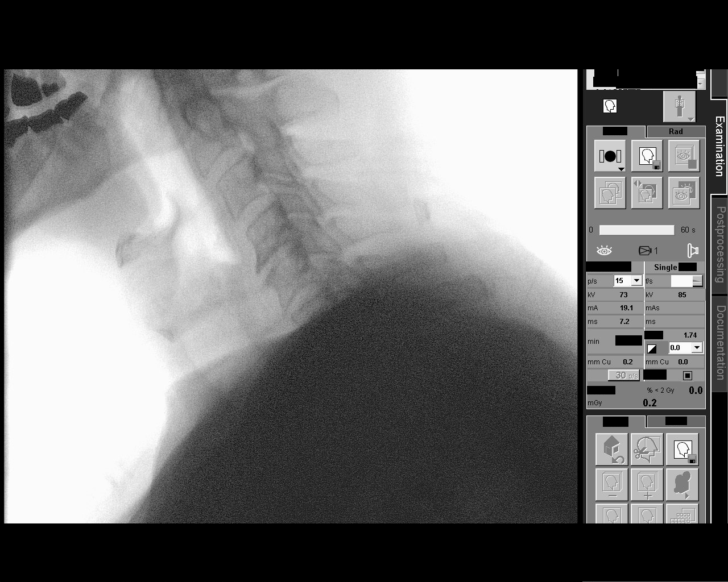

[Series 3: run · 1 of 169 frames shown (3 of 13)]
[frame 144/169]
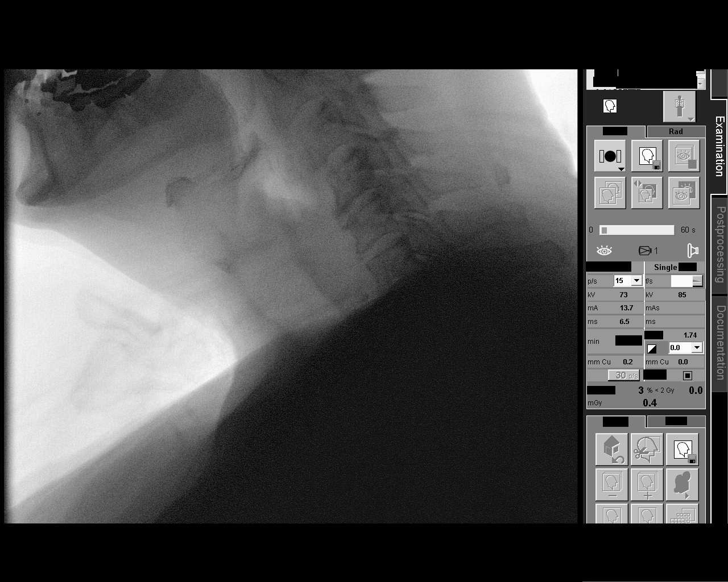

[Series 5: run · 1 of 152 frames shown (4 of 13)]
[frame 23/152]
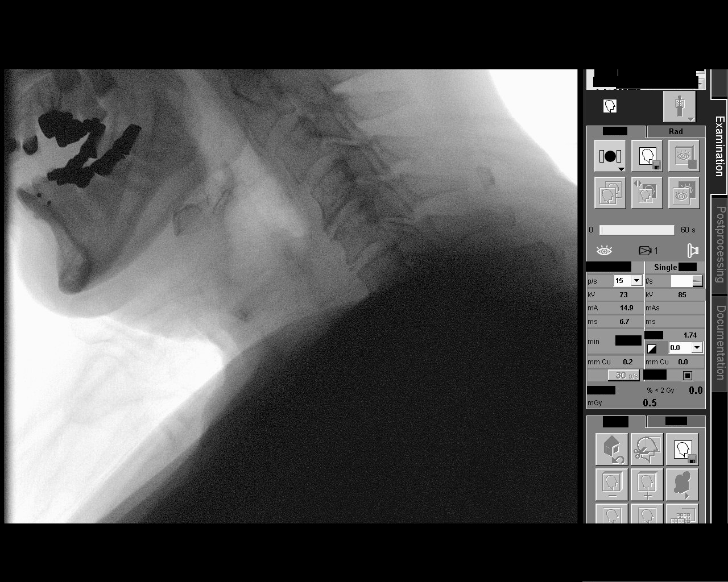

[Series 6: run · 1 of 177 frames shown (5 of 13)]
[frame 53/177]
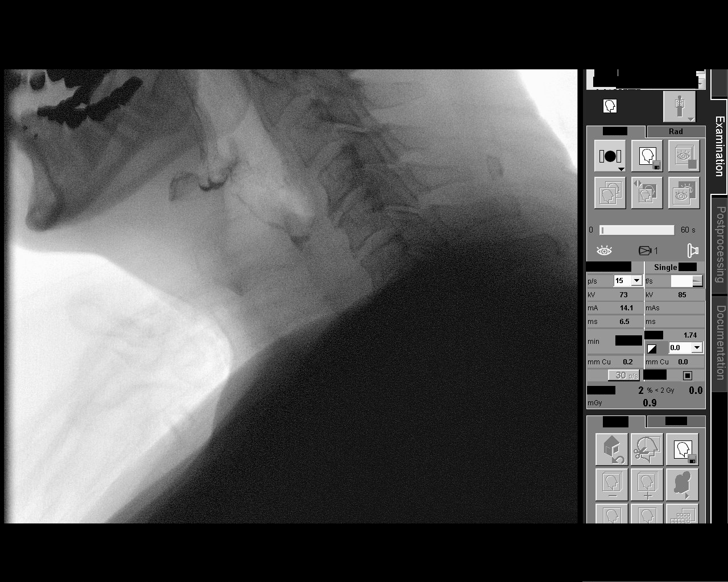

[Series 7: run · 1 of 46 frames shown (6 of 13)]
[frame 24/46]
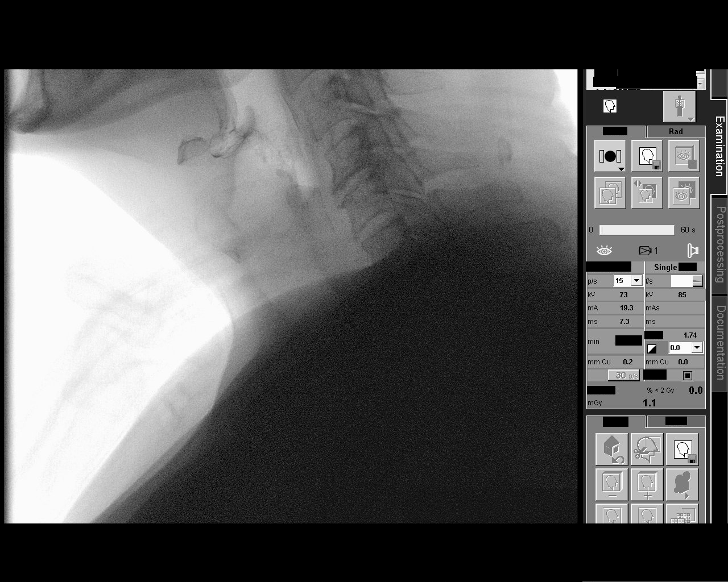

[Series 8: run · 1 of 353 frames shown (7 of 13)]
[frame 301/353]
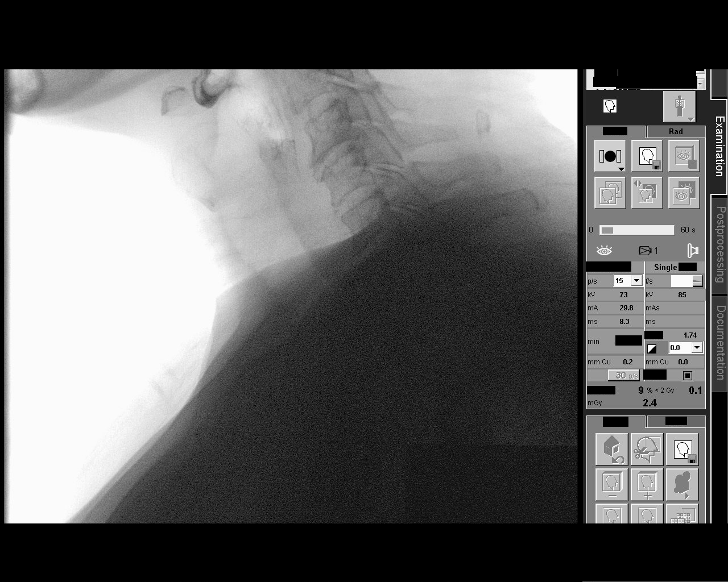

[Series 9: run · 1 of 317 frames shown (8 of 13)]
[frame 173/317]
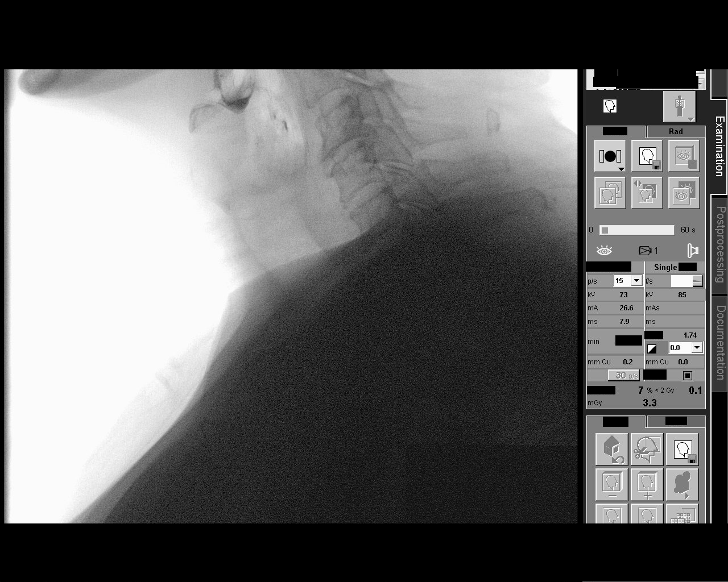

[Series 10: run · 1 of 213 frames shown (9 of 13)]
[frame 185/213]
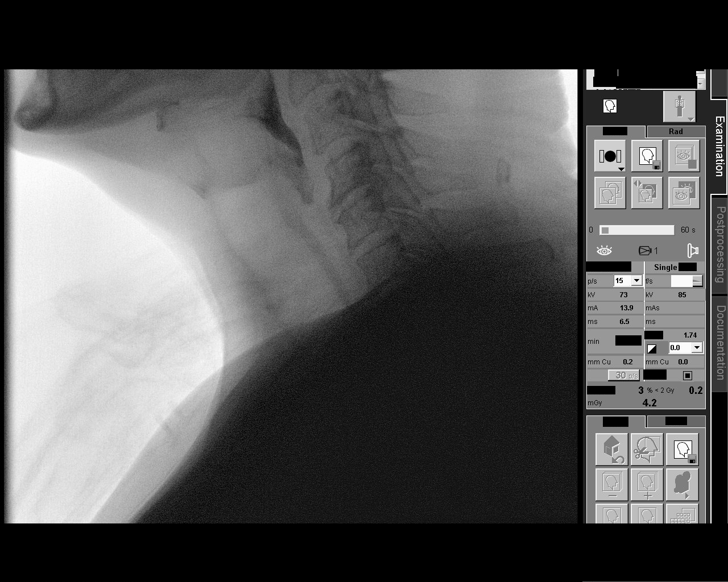

[Series 12: run · 1 of 730 frames shown (10 of 13)]
[frame 110/730]
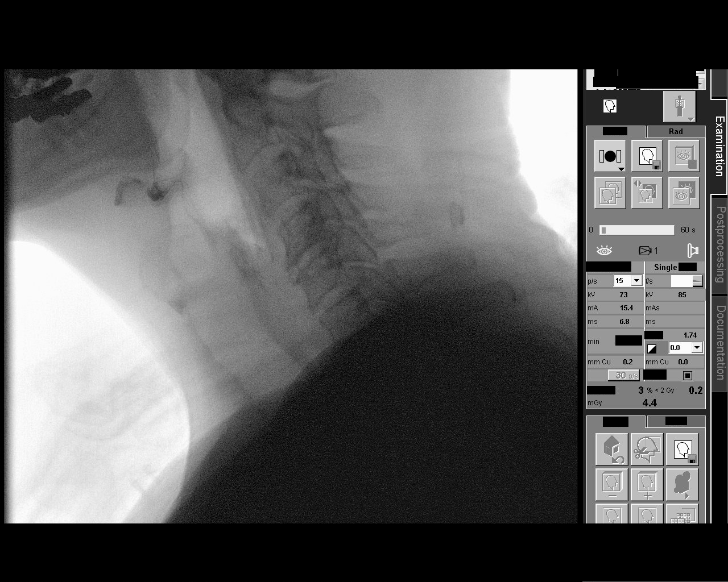

[Series 13: run · 1 of 359 frames shown (11 of 13)]
[frame 180/359]
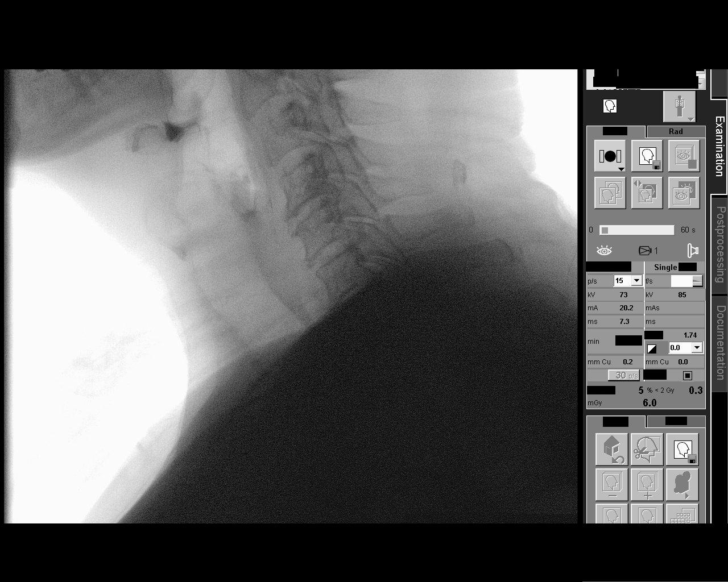

[Series 14: run · 1 of 105 frames shown (12 of 13)]
[frame 64/105]
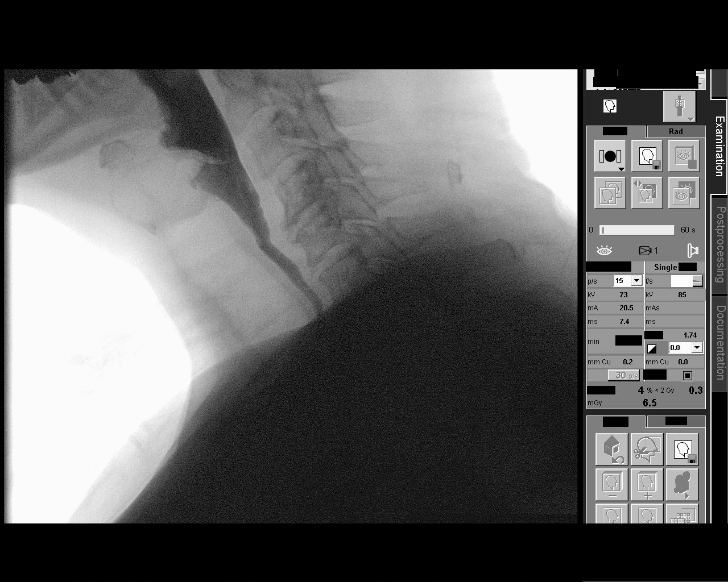

[Series 15: run · 1 of 67 frames shown (13 of 13)]
[frame 57/67]
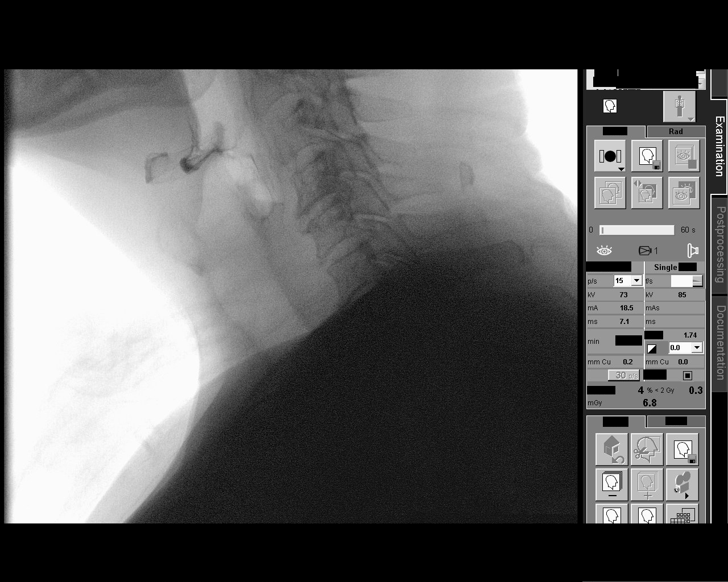

[13 of 24 positions shown; findings below may reference images not displayed]

FINDINGS: Thin liquid- within normal limits

Nectar thick liquid- within normal limits

[place]?Jhon Breiner minimal vallecular pooling. No laryngeal penetration or
aspiration.

Ay[place]?Haalliima with cracker- minimal vallecular pooling. No laryngeal
penetration or aspiration.
IMPRESSION: Minimal vallecular pooling with thicker material only. No laryngeal
penetration or aspiration. Study otherwise unremarkable.

Please refer to the Speech Pathologists report for complete details
and recommendations.

## 2016-04-28 DIAGNOSIS — H04123 Dry eye syndrome of bilateral lacrimal glands: Secondary | ICD-10-CM | POA: Diagnosis not present

## 2016-05-21 DIAGNOSIS — E663 Overweight: Secondary | ICD-10-CM | POA: Diagnosis not present

## 2016-05-21 DIAGNOSIS — Z6829 Body mass index (BMI) 29.0-29.9, adult: Secondary | ICD-10-CM | POA: Diagnosis not present

## 2016-05-21 DIAGNOSIS — Z7952 Long term (current) use of systemic steroids: Secondary | ICD-10-CM | POA: Diagnosis not present

## 2016-05-21 DIAGNOSIS — M5136 Other intervertebral disc degeneration, lumbar region: Secondary | ICD-10-CM | POA: Diagnosis not present

## 2016-05-21 DIAGNOSIS — M5416 Radiculopathy, lumbar region: Secondary | ICD-10-CM | POA: Diagnosis not present

## 2016-05-21 DIAGNOSIS — M150 Primary generalized (osteo)arthritis: Secondary | ICD-10-CM | POA: Diagnosis not present

## 2016-05-21 DIAGNOSIS — H04123 Dry eye syndrome of bilateral lacrimal glands: Secondary | ICD-10-CM | POA: Diagnosis not present

## 2016-05-21 DIAGNOSIS — M0589 Other rheumatoid arthritis with rheumatoid factor of multiple sites: Secondary | ICD-10-CM | POA: Diagnosis not present

## 2016-05-21 DIAGNOSIS — R5382 Chronic fatigue, unspecified: Secondary | ICD-10-CM | POA: Diagnosis not present

## 2016-05-21 DIAGNOSIS — R531 Weakness: Secondary | ICD-10-CM | POA: Diagnosis not present

## 2016-09-11 DIAGNOSIS — I4891 Unspecified atrial fibrillation: Secondary | ICD-10-CM | POA: Diagnosis not present

## 2016-09-11 DIAGNOSIS — Z79899 Other long term (current) drug therapy: Secondary | ICD-10-CM | POA: Diagnosis not present

## 2016-09-11 DIAGNOSIS — R918 Other nonspecific abnormal finding of lung field: Secondary | ICD-10-CM | POA: Diagnosis not present

## 2016-09-11 DIAGNOSIS — Z9189 Other specified personal risk factors, not elsewhere classified: Secondary | ICD-10-CM | POA: Diagnosis not present

## 2016-09-11 DIAGNOSIS — Z95810 Presence of automatic (implantable) cardiac defibrillator: Secondary | ICD-10-CM | POA: Diagnosis not present

## 2016-09-11 DIAGNOSIS — I472 Ventricular tachycardia: Secondary | ICD-10-CM | POA: Diagnosis not present

## 2016-09-11 DIAGNOSIS — I255 Ischemic cardiomyopathy: Secondary | ICD-10-CM | POA: Diagnosis not present

## 2016-09-11 DIAGNOSIS — I2510 Atherosclerotic heart disease of native coronary artery without angina pectoris: Secondary | ICD-10-CM | POA: Diagnosis not present

## 2016-09-11 DIAGNOSIS — I10 Essential (primary) hypertension: Secondary | ICD-10-CM | POA: Diagnosis not present

## 2016-09-22 DIAGNOSIS — R51 Headache: Secondary | ICD-10-CM | POA: Diagnosis not present

## 2016-09-22 DIAGNOSIS — J328 Other chronic sinusitis: Secondary | ICD-10-CM | POA: Diagnosis not present

## 2016-09-24 DIAGNOSIS — Z7952 Long term (current) use of systemic steroids: Secondary | ICD-10-CM | POA: Diagnosis not present

## 2016-09-24 DIAGNOSIS — E663 Overweight: Secondary | ICD-10-CM | POA: Diagnosis not present

## 2016-09-24 DIAGNOSIS — R5382 Chronic fatigue, unspecified: Secondary | ICD-10-CM | POA: Diagnosis not present

## 2016-09-24 DIAGNOSIS — M0589 Other rheumatoid arthritis with rheumatoid factor of multiple sites: Secondary | ICD-10-CM | POA: Diagnosis not present

## 2016-09-24 DIAGNOSIS — H04123 Dry eye syndrome of bilateral lacrimal glands: Secondary | ICD-10-CM | POA: Diagnosis not present

## 2016-09-24 DIAGNOSIS — M5136 Other intervertebral disc degeneration, lumbar region: Secondary | ICD-10-CM | POA: Diagnosis not present

## 2016-09-24 DIAGNOSIS — M5416 Radiculopathy, lumbar region: Secondary | ICD-10-CM | POA: Diagnosis not present

## 2016-09-24 DIAGNOSIS — F0631 Mood disorder due to known physiological condition with depressive features: Secondary | ICD-10-CM | POA: Diagnosis not present

## 2016-09-24 DIAGNOSIS — M150 Primary generalized (osteo)arthritis: Secondary | ICD-10-CM | POA: Diagnosis not present

## 2016-09-24 DIAGNOSIS — Z6828 Body mass index (BMI) 28.0-28.9, adult: Secondary | ICD-10-CM | POA: Diagnosis not present

## 2016-09-24 DIAGNOSIS — R531 Weakness: Secondary | ICD-10-CM | POA: Diagnosis not present

## 2016-09-24 DIAGNOSIS — H9192 Unspecified hearing loss, left ear: Secondary | ICD-10-CM | POA: Diagnosis not present

## 2016-09-25 DIAGNOSIS — R51 Headache: Secondary | ICD-10-CM | POA: Diagnosis not present

## 2016-09-25 DIAGNOSIS — R42 Dizziness and giddiness: Secondary | ICD-10-CM | POA: Diagnosis not present

## 2016-10-01 DIAGNOSIS — Z79899 Other long term (current) drug therapy: Secondary | ICD-10-CM | POA: Diagnosis not present

## 2016-10-28 DIAGNOSIS — G43719 Chronic migraine without aura, intractable, without status migrainosus: Secondary | ICD-10-CM | POA: Diagnosis not present

## 2016-10-28 DIAGNOSIS — M7918 Myalgia, other site: Secondary | ICD-10-CM | POA: Diagnosis not present

## 2016-10-28 DIAGNOSIS — E538 Deficiency of other specified B group vitamins: Secondary | ICD-10-CM | POA: Diagnosis not present

## 2016-10-28 DIAGNOSIS — M5481 Occipital neuralgia: Secondary | ICD-10-CM | POA: Diagnosis not present

## 2016-10-28 DIAGNOSIS — E559 Vitamin D deficiency, unspecified: Secondary | ICD-10-CM | POA: Diagnosis not present

## 2016-10-31 DIAGNOSIS — G44321 Chronic post-traumatic headache, intractable: Secondary | ICD-10-CM | POA: Diagnosis not present

## 2016-10-31 DIAGNOSIS — R700 Elevated erythrocyte sedimentation rate: Secondary | ICD-10-CM | POA: Diagnosis not present

## 2016-11-03 ENCOUNTER — Other Ambulatory Visit: Payer: Self-pay | Admitting: Internal Medicine

## 2016-11-03 DIAGNOSIS — G44321 Chronic post-traumatic headache, intractable: Secondary | ICD-10-CM

## 2016-11-05 DIAGNOSIS — G44321 Chronic post-traumatic headache, intractable: Secondary | ICD-10-CM | POA: Diagnosis not present

## 2016-11-05 DIAGNOSIS — M5481 Occipital neuralgia: Secondary | ICD-10-CM | POA: Diagnosis not present

## 2016-11-07 ENCOUNTER — Ambulatory Visit
Admission: RE | Admit: 2016-11-07 | Discharge: 2016-11-07 | Disposition: A | Discharge status: Home / Self Care | Payer: Medicare Other | Source: Ambulatory Visit | Attending: Internal Medicine | Admitting: Internal Medicine

## 2016-11-07 DIAGNOSIS — R42 Dizziness and giddiness: Secondary | ICD-10-CM | POA: Diagnosis not present

## 2016-11-07 DIAGNOSIS — R2681 Unsteadiness on feet: Secondary | ICD-10-CM | POA: Diagnosis not present

## 2016-11-07 DIAGNOSIS — I6782 Cerebral ischemia: Secondary | ICD-10-CM | POA: Insufficient documentation

## 2016-11-07 DIAGNOSIS — G44321 Chronic post-traumatic headache, intractable: Secondary | ICD-10-CM | POA: Insufficient documentation

## 2016-11-13 DIAGNOSIS — G44321 Chronic post-traumatic headache, intractable: Secondary | ICD-10-CM | POA: Diagnosis not present

## 2016-11-13 DIAGNOSIS — R700 Elevated erythrocyte sedimentation rate: Secondary | ICD-10-CM | POA: Diagnosis not present

## 2016-11-20 DIAGNOSIS — Z23 Encounter for immunization: Secondary | ICD-10-CM | POA: Diagnosis not present

## 2016-12-01 ENCOUNTER — Other Ambulatory Visit: Payer: Self-pay

## 2016-12-01 ENCOUNTER — Emergency Department: Payer: Medicare Other

## 2016-12-01 ENCOUNTER — Inpatient Hospital Stay
Admission: EM | Admit: 2016-12-01 | Discharge: 2016-12-02 | DRG: 309 | Disposition: A | Discharge status: Home / Self Care | Payer: Medicare Other | Attending: Internal Medicine | Admitting: Internal Medicine

## 2016-12-01 DIAGNOSIS — I214 Non-ST elevation (NSTEMI) myocardial infarction: Secondary | ICD-10-CM | POA: Diagnosis present

## 2016-12-01 DIAGNOSIS — R51 Headache: Secondary | ICD-10-CM | POA: Diagnosis not present

## 2016-12-01 DIAGNOSIS — W1830XA Fall on same level, unspecified, initial encounter: Secondary | ICD-10-CM | POA: Diagnosis present

## 2016-12-01 DIAGNOSIS — E785 Hyperlipidemia, unspecified: Secondary | ICD-10-CM | POA: Diagnosis not present

## 2016-12-01 DIAGNOSIS — Z955 Presence of coronary angioplasty implant and graft: Secondary | ICD-10-CM

## 2016-12-01 DIAGNOSIS — I472 Ventricular tachycardia, unspecified: Secondary | ICD-10-CM

## 2016-12-01 DIAGNOSIS — Z7982 Long term (current) use of aspirin: Secondary | ICD-10-CM

## 2016-12-01 DIAGNOSIS — Z79899 Other long term (current) drug therapy: Secondary | ICD-10-CM | POA: Diagnosis not present

## 2016-12-01 DIAGNOSIS — I2510 Atherosclerotic heart disease of native coronary artery without angina pectoris: Secondary | ICD-10-CM | POA: Diagnosis present

## 2016-12-01 DIAGNOSIS — Z820 Family history of epilepsy and other diseases of the nervous system: Secondary | ICD-10-CM

## 2016-12-01 DIAGNOSIS — I110 Hypertensive heart disease with heart failure: Secondary | ICD-10-CM | POA: Diagnosis not present

## 2016-12-01 DIAGNOSIS — I252 Old myocardial infarction: Secondary | ICD-10-CM

## 2016-12-01 DIAGNOSIS — Z951 Presence of aortocoronary bypass graft: Secondary | ICD-10-CM | POA: Diagnosis not present

## 2016-12-01 DIAGNOSIS — W19XXXA Unspecified fall, initial encounter: Secondary | ICD-10-CM | POA: Diagnosis not present

## 2016-12-01 DIAGNOSIS — I248 Other forms of acute ischemic heart disease: Secondary | ICD-10-CM | POA: Diagnosis not present

## 2016-12-01 DIAGNOSIS — I429 Cardiomyopathy, unspecified: Secondary | ICD-10-CM | POA: Diagnosis not present

## 2016-12-01 DIAGNOSIS — R42 Dizziness and giddiness: Secondary | ICD-10-CM | POA: Diagnosis not present

## 2016-12-01 DIAGNOSIS — S0101XA Laceration without foreign body of scalp, initial encounter: Secondary | ICD-10-CM | POA: Diagnosis not present

## 2016-12-01 DIAGNOSIS — R55 Syncope and collapse: Secondary | ICD-10-CM | POA: Diagnosis not present

## 2016-12-01 DIAGNOSIS — Z66 Do not resuscitate: Secondary | ICD-10-CM | POA: Diagnosis present

## 2016-12-01 DIAGNOSIS — S0990XA Unspecified injury of head, initial encounter: Secondary | ICD-10-CM | POA: Diagnosis not present

## 2016-12-01 DIAGNOSIS — S199XXA Unspecified injury of neck, initial encounter: Secondary | ICD-10-CM | POA: Diagnosis not present

## 2016-12-01 DIAGNOSIS — M542 Cervicalgia: Secondary | ICD-10-CM | POA: Diagnosis not present

## 2016-12-01 DIAGNOSIS — Z95810 Presence of automatic (implantable) cardiac defibrillator: Secondary | ICD-10-CM | POA: Diagnosis not present

## 2016-12-01 LAB — COMPREHENSIVE METABOLIC PANEL
ALK PHOS: 39 U/L (ref 38–126)
ALT: 38 U/L (ref 17–63)
ANION GAP: 12 (ref 5–15)
AST: 48 U/L — ABNORMAL HIGH (ref 15–41)
Albumin: 3.2 g/dL — ABNORMAL LOW (ref 3.5–5.0)
BILIRUBIN TOTAL: 0.7 mg/dL (ref 0.3–1.2)
BUN: 33 mg/dL — ABNORMAL HIGH (ref 6–20)
CALCIUM: 8.5 mg/dL — AB (ref 8.9–10.3)
CO2: 24 mmol/L (ref 22–32)
CREATININE: 1.2 mg/dL (ref 0.61–1.24)
Chloride: 99 mmol/L — ABNORMAL LOW (ref 101–111)
GFR calc non Af Amer: 57 mL/min — ABNORMAL LOW (ref >60)
GLUCOSE: 140 mg/dL — AB (ref 65–99)
Potassium: 4.4 mmol/L (ref 3.5–5.1)
Sodium: 135 mmol/L (ref 135–145)
TOTAL PROTEIN: 6.6 g/dL (ref 6.5–8.1)

## 2016-12-01 LAB — CBC
HEMATOCRIT: 38.7 % — AB (ref 40.0–52.0)
HEMOGLOBIN: 12.8 g/dL — AB (ref 13.0–18.0)
MCH: 31.5 pg (ref 26.0–34.0)
MCHC: 33.1 g/dL (ref 32.0–36.0)
MCV: 95.3 fL (ref 80.0–100.0)
Platelets: 160 10*3/uL (ref 150–440)
RBC: 4.06 MIL/uL — ABNORMAL LOW (ref 4.40–5.90)
RDW: 14.6 % — ABNORMAL HIGH (ref 11.5–14.5)
WBC: 8.2 10*3/uL (ref 3.8–10.6)

## 2016-12-01 LAB — ETHANOL

## 2016-12-01 LAB — TROPONIN I: TROPONIN I: 0.18 ng/mL — AB (ref <0.03)

## 2016-12-01 LAB — AMMONIA: Ammonia: 16 umol/L (ref 9–35)

## 2016-12-01 MED ORDER — SODIUM CHLORIDE 0.9 % IV BOLUS (SEPSIS)
500.0000 mL | Freq: Once | INTRAVENOUS | Status: AC
  Administered 2016-12-02: 500 mL via INTRAVENOUS

## 2016-12-01 NOTE — ED Triage Notes (Addendum)
Pt arrived via EMS from home d/t dizziness and a fall. Pt has been experiencing vertigo for a while now. Pt is A&Ox3 instead of his baseline A&Ox4. Pt has a laceration to the back of the head. Pt c/o  Head and neck pain. Pt also c/o nausea.

## 2016-12-01 NOTE — ED Provider Notes (Signed)
[facility] Emergency Department Provider Note  Time seen: 11:30 PM  I have reviewed the triage vital signs and the nursing notes.   HISTORY  Chief Complaint Fall and Laceration    HPI [name] is a 76 y.o. male with a past medical history of CHF, AICD implanted, hyperlipidemia, MI, chronic vertigo, chronic headaches who presents to the emergency department after a fall.  According to the patient he states he went outside to get ice cream, was walking back inside and became very dizzy.  He states shortly after we will walking back inside he thinks he passed out.  Patient fell to the floor per wife and hit the back of his head.  Wife states she heard the patient fall and came into the room and he was in and out of consciousness per wife.  Took several minutes before the patient came to and was acting somewhat back to normal but slower than normal.  Wife states since the fall he has slowly improved and is now largely back to normal.  Patient states a history of vertigo for the past 2-1/2 years as well as chronic headaches.  Denies any chest pain or trouble breathing.  Denies any nausea or diaphoresis.     Past Medical History:  Diagnosis Date  . AICD (automatic cardioverter/defibrillator) present    [employer]  . Arthritis    RA  . Balance problem   . CHF (congestive heart failure) ([place])   . Collagen vascular disease ([place])    RA since 2014  . Coronary artery disease   . Deviated nasal septum   . Dysrhythmia   . Hearing loss    TINNITUS  . High cholesterol   . Leg weakness   . Myocardial infarction ([place])   . Presence of permanent cardiac pacemaker    WITH DEFIBRILATOR ([employer])  . Seasonal allergies    RHINITIS  . Shortness of breath dyspnea    COUGH  . Sinus headache   . Sinusitis    CHRONIC  . Sleep apnea   . Vertigo    DYSEQUILIBRIUM    Patient Active Problem List   Diagnosis Date Noted  . V-tach ([place]) 08/20/2014  .  Hyperthyroidism 08/20/2014  . Carotid stenosis 08/20/2014  . Dyspepsia 08/20/2014  . Malnutrition of moderate degree ([place]) 08/19/2014  . Syncope and collapse 08/18/2014  . Chest pain 07/15/2014  . CAD (coronary artery disease) 07/15/2014  . Fall at home 07/15/2014  . Nausea and vomiting 07/15/2014  . Nausea & vomiting 07/15/2014  . Abnormality of gait 12/13/2013  . Low back pain 12/13/2013  . Arthritis   . Sinus headache   . Injury of kidney 12/02/2013  . Arteriosclerosis of coronary artery 12/02/2013  . Body water dehydration 12/02/2013  . Sinus infection 12/02/2013  . Idiopathic ventricular tachycardia ([place]) 09/14/2012  . Heart failure, systolic ([place]) 45/62/5638  . Automatic implantable cardioverter-defibrillator in situ 07/26/2012  . Acute MI, subendocardial ([place]) 07/12/2012  . Essential (primary) hypertension 07/12/2012  . HLD (hyperlipidemia) 07/12/2012  . BP (high blood pressure) 07/12/2012  . Acute non-ST segment elevation myocardial infarction ([place]) 07/12/2012  . Arthritis or polyarthritis, rheumatoid ([place]) 07/12/2012    Past Surgical History:  Procedure Laterality Date  . CARDIAC SURGERY    . CATARACT EXTRACTION     BILATERAL  . CORONARY ANGIOPLASTY     STENTS  . CORONARY ARTERY BYPASS GRAFT  1992  . heart pump    . INSERT / REPLACE /  REMOVE PACEMAKER    . SEPTOPLASTY      Prior to Admission medications   Medication Sig Start Date End Date Taking? Authorizing Provider  acetaminophen (TYLENOL) 325 MG tablet Take 2 tablets (650 mg total) by mouth every 6 (six) hours as needed for mild pain (or Fever >/= 101). 07/18/14   Gouru, Aruna, MD  amiodarone (PACERONE) 400 MG tablet Take 400 mg by mouth daily.    [provider]  aspirin EC 81 MG tablet Take 81 mg by mouth daily.    [provider]  cetirizine (ZYRTEC) 10 MG tablet Take 10 mg by mouth daily.    [provider]  Cholecalciferol (VITAMIN D3) 2000 UNITS TABS Take 2,000 Units by  mouth daily.     [provider]  docusate sodium (COLACE) 100 MG capsule Take 100 mg by mouth every evening.    [provider]  fluticasone (FLONASE) 50 MCG/ACT nasal spray Place 1 spray into both nostrils daily as needed for rhinitis.     [provider]  furosemide (LASIX) 40 MG tablet Take 40 mg by mouth daily as needed for edema.    [provider]  metoprolol succinate (TOPROL-XL) 25 MG 24 hr tablet Take 25 mg by mouth 2 (two) times daily. Take 50mg  every morning and 25mg  every evening.    [provider]  mexiletine (MEXITIL) 150 MG capsule Take 150 mg by mouth 2 (two) times daily.    [provider]  nitroGLYCERIN (NITROSTAT) 0.4 MG SL tablet Place 0.4 mg under the tongue every 5 (five) minutes as needed for chest pain.    [provider]  spironolactone (ALDACTONE) 25 MG tablet Take 25 mg by mouth daily.    [provider]    Allergies  Allergen Reactions  . Pravastatin Other (See Comments)    Reaction:  Joint pain     Family History  Problem Relation Age of Onset  . Alzheimer's disease Mother     Social History Social History   Tobacco Use  . Smoking status: Never Smoker  . Smokeless tobacco: Never Used  Substance Use Topics  . Alcohol use: No    Alcohol/week: 0.0 oz  . Drug use: No    Review of Systems Constitutional: Negative for fever.  Possible loss of consciousness. Cardiovascular: Negative for chest pain. Respiratory: Negative for shortness of breath. Gastrointestinal: Negative for abdominal pain, vomiting  Genitourinary: Negative for dysuria. Musculoskeletal: Negative for neck or back pain. Skin: Abrasion versus laceration to occipital scalp. Neurological: Mild headache, but patient states this is chronic for the past 2.5 years. All other ROS negative  ____________________________________________   PHYSICAL EXAM:  VITAL SIGNS: ED Triage Vitals  Enc Vitals Group     BP 12/01/16  2240 136/72     Pulse Rate 12/01/16 2240 70     Resp 12/01/16 2240 19     Temp 12/01/16 2240 (!) 97.4 F (36.3 C)     Temp Source 12/01/16 2240 Oral     SpO2 12/01/16 2240 98 %     Weight 12/01/16 2242 175 lb (79.4 kg)     Height 12/01/16 2242 5\' 8"  (1.727 m)     Head Circumference --      Peak Flow --      Pain Score 12/01/16 2238 7     Pain Loc --      Pain Edu? --      Excl. in [place]? --    Constitutional: Alert and  oriented. Well appearing and in no distress. Eyes: Normal exam ENT   Head: Normocephalic and atraumatic.   Mouth/Throat: Mucous membranes are moist. Cardiovascular: Normal rate, regular rhythm. No murmur Respiratory: Normal respiratory effort without tachypnea nor retractions. Breath sounds are clear  Gastrointestinal: Soft and nontender. No distention.   Musculoskeletal: Nontender with normal range of motion in all extremities.  Neurologic:  Normal speech and language. No gross focal neurologic deficits.  Equal grip strength.  No pronator drift.  5/5 motor in all extremities, no cranial nerve deficits. Skin:  Skin is warm, dry.  Abrasion versus laceration to back of scalp has dried blood currently we will clean and reassess. Psychiatric: Mood and affect are normal.   ____________________________________________    EKG  EKG reviewed and interpreted by myself shows sinus rhythm at 71 bpm, left axis deviation, nonspecific ST changes.  Right bundle branch block.  ____________________________________________    RADIOLOGY  CT scan of the head is negative.  CT scan of the neck is negative.  ____________________________________________   INITIAL IMPRESSION / ASSESSMENT AND PLAN / ED COURSE  Pertinent labs & imaging results that were available during my care of the patient were reviewed by me and considered in my medical decision making (see chart for details).  Patient presents to the emergency department after a fall and possible syncopal episode at  home.  Differential would include syncope, seizure, fall, closed head injury, electrolyte abnormality, dehydration.  The patient does state he rarely takes Lasix but gained 2 or 3 pounds yesterday and fluid so he took Lasix last night and states he has lost 3 or 4 pounds since taking Lasix.  Currently the patient appears well, answers all questions appropriately, is alert and oriented x4.  Wife states he is largely back to normal possibly slightly slower than normal per wife, but much improved from earlier.  We will check labs, EKG.  CT imaging of the head and neck are normal.  We will IV hydrate with 500 cc of normal saline.  We will attempt to interrogate the patient's AICD.  Overall patient appears well.  Patient's labs show an elevated troponin 0.18.  We were able to interrogate the patient's pacemaker/AICD.  Technician states patient had a run of ventricular tachycardia 260 bpm with a single shock delivered around 10:13 PM tonight.  This likely explains the patient's symptoms and syncopal episode.  We will admit to the hospital for further treatment.  Patient agreeable to this plan of care.  I reviewed the patient's records including his most recent cardiology records from Dr. Ubaldo Glassing.  Patient has an AICD for ventricular tachycardia but has not had any breakthrough episodes according to his last note.  ____________________________________________   FINAL CLINICAL IMPRESSION(S) / ED DIAGNOSES  Syncope Fall Ventricular tachycardia status post AICD defibrillation   Harvest Dark, MD 12/02/16 [phone]

## 2016-12-01 NOTE — ED Notes (Signed)
Dr. Kerman Passey made aware of critical troponin lab value of 0.18

## 2016-12-02 ENCOUNTER — Other Ambulatory Visit: Payer: Self-pay

## 2016-12-02 ENCOUNTER — Encounter: Payer: Self-pay | Admitting: Internal Medicine

## 2016-12-02 DIAGNOSIS — I214 Non-ST elevation (NSTEMI) myocardial infarction: Secondary | ICD-10-CM | POA: Diagnosis present

## 2016-12-02 DIAGNOSIS — S0101XA Laceration without foreign body of scalp, initial encounter: Secondary | ICD-10-CM | POA: Diagnosis present

## 2016-12-02 DIAGNOSIS — Z820 Family history of epilepsy and other diseases of the nervous system: Secondary | ICD-10-CM | POA: Diagnosis not present

## 2016-12-02 DIAGNOSIS — I2510 Atherosclerotic heart disease of native coronary artery without angina pectoris: Secondary | ICD-10-CM | POA: Diagnosis present

## 2016-12-02 DIAGNOSIS — Z95810 Presence of automatic (implantable) cardiac defibrillator: Secondary | ICD-10-CM | POA: Diagnosis not present

## 2016-12-02 DIAGNOSIS — I248 Other forms of acute ischemic heart disease: Secondary | ICD-10-CM | POA: Diagnosis present

## 2016-12-02 DIAGNOSIS — Z66 Do not resuscitate: Secondary | ICD-10-CM | POA: Diagnosis present

## 2016-12-02 DIAGNOSIS — E785 Hyperlipidemia, unspecified: Secondary | ICD-10-CM | POA: Diagnosis present

## 2016-12-02 DIAGNOSIS — Z955 Presence of coronary angioplasty implant and graft: Secondary | ICD-10-CM | POA: Diagnosis not present

## 2016-12-02 DIAGNOSIS — I252 Old myocardial infarction: Secondary | ICD-10-CM | POA: Diagnosis not present

## 2016-12-02 DIAGNOSIS — I472 Ventricular tachycardia: Secondary | ICD-10-CM | POA: Diagnosis present

## 2016-12-02 DIAGNOSIS — Z7982 Long term (current) use of aspirin: Secondary | ICD-10-CM | POA: Diagnosis not present

## 2016-12-02 DIAGNOSIS — I110 Hypertensive heart disease with heart failure: Secondary | ICD-10-CM | POA: Diagnosis present

## 2016-12-02 DIAGNOSIS — I429 Cardiomyopathy, unspecified: Secondary | ICD-10-CM | POA: Diagnosis present

## 2016-12-02 DIAGNOSIS — W1830XA Fall on same level, unspecified, initial encounter: Secondary | ICD-10-CM | POA: Diagnosis not present

## 2016-12-02 DIAGNOSIS — Z951 Presence of aortocoronary bypass graft: Secondary | ICD-10-CM | POA: Diagnosis not present

## 2016-12-02 DIAGNOSIS — R55 Syncope and collapse: Secondary | ICD-10-CM | POA: Diagnosis not present

## 2016-12-02 DIAGNOSIS — Z79899 Other long term (current) drug therapy: Secondary | ICD-10-CM | POA: Diagnosis not present

## 2016-12-02 LAB — URINALYSIS, ROUTINE W REFLEX MICROSCOPIC
BILIRUBIN URINE: NEGATIVE
Glucose, UA: NEGATIVE mg/dL
Hgb urine dipstick: NEGATIVE
KETONES UR: NEGATIVE mg/dL
Leukocytes, UA: NEGATIVE
NITRITE: NEGATIVE
PH: 6 (ref 5.0–8.0)
PROTEIN: NEGATIVE mg/dL
Specific Gravity, Urine: 1.015 (ref 1.005–1.030)

## 2016-12-02 LAB — CBC
HEMATOCRIT: 38.3 % — AB (ref 40.0–52.0)
Hemoglobin: 12.7 g/dL — ABNORMAL LOW (ref 13.0–18.0)
MCH: 31.8 pg (ref 26.0–34.0)
MCHC: 33.1 g/dL (ref 32.0–36.0)
MCV: 96.2 fL (ref 80.0–100.0)
PLATELETS: 154 10*3/uL (ref 150–440)
RBC: 3.98 MIL/uL — ABNORMAL LOW (ref 4.40–5.90)
RDW: 14.5 % (ref 11.5–14.5)
WBC: 9.2 10*3/uL (ref 3.8–10.6)

## 2016-12-02 LAB — PROTIME-INR
INR: 0.94
PROTHROMBIN TIME: 12.5 s (ref 11.4–15.2)

## 2016-12-02 LAB — TROPONIN I
TROPONIN I: 0.18 ng/mL — AB (ref <0.03)
Troponin I: 0.21 ng/mL (ref <0.03)
Troponin I: 0.21 ng/mL (ref <0.03)

## 2016-12-02 LAB — MAGNESIUM: Magnesium: 2.3 mg/dL (ref 1.7–2.4)

## 2016-12-02 LAB — HEPARIN LEVEL (UNFRACTIONATED): HEPARIN UNFRACTIONATED: 0.71 [IU]/mL — AB (ref 0.30–0.70)

## 2016-12-02 LAB — APTT

## 2016-12-02 MED ORDER — METOPROLOL SUCCINATE ER 50 MG PO TB24: 50.0000 mg | ORAL_TABLET | Freq: Two times a day (BID) | ORAL | 1 refills | Status: DC

## 2016-12-02 MED ORDER — DEXTROSE-NACL 5-0.9 % IV SOLN
INTRAVENOUS | Status: DC
  Administered 2016-12-02: 04:00:00 via INTRAVENOUS

## 2016-12-02 MED ORDER — ACETAMINOPHEN 325 MG PO TABS
650.0000 mg | ORAL_TABLET | ORAL | Status: DC | PRN
  Administered 2016-12-02: 650 mg via ORAL
  Filled 2016-12-02: qty 2

## 2016-12-02 MED ORDER — FLUTICASONE PROPIONATE 50 MCG/ACT NA SUSP
1.0000 | Freq: Every day | NASAL | Status: DC | PRN
  Filled 2016-12-02: qty 16

## 2016-12-02 MED ORDER — ASPIRIN 81 MG PO CHEW
324.0000 mg | CHEWABLE_TABLET | ORAL | Status: AC
  Administered 2016-12-02: 324 mg via ORAL
  Filled 2016-12-02: qty 4

## 2016-12-02 MED ORDER — LORATADINE 10 MG PO TABS
10.0000 mg | ORAL_TABLET | Freq: Every day | ORAL | Status: DC
  Administered 2016-12-02: 10 mg via ORAL
  Filled 2016-12-02: qty 1

## 2016-12-02 MED ORDER — METOPROLOL SUCCINATE ER 50 MG PO TB24: 50.0000 mg | ORAL_TABLET | Freq: Two times a day (BID) | ORAL | Status: DC

## 2016-12-02 MED ORDER — VITAMIN D 1000 UNITS PO TABS
2000.0000 [IU] | ORAL_TABLET | Freq: Every day | ORAL | Status: DC
  Administered 2016-12-02: 2000 [IU] via ORAL
  Filled 2016-12-02: qty 2

## 2016-12-02 MED ORDER — ONDANSETRON HCL 4 MG/2ML IJ SOLN: 4.0000 mg | Freq: Four times a day (QID) | INTRAMUSCULAR | Status: DC | PRN

## 2016-12-02 MED ORDER — ATORVASTATIN CALCIUM 10 MG PO TABS: 10.0000 mg | ORAL_TABLET | Freq: Every day | ORAL | 0 refills | Status: DC

## 2016-12-02 MED ORDER — ASPIRIN 300 MG RE SUPP: 300.0000 mg | RECTAL | Status: AC

## 2016-12-02 MED ORDER — DOCUSATE SODIUM 100 MG PO CAPS: 100.0000 mg | ORAL_CAPSULE | Freq: Every evening | ORAL | Status: DC

## 2016-12-02 MED ORDER — NITROGLYCERIN 0.4 MG SL SUBL: 0.4000 mg | SUBLINGUAL_TABLET | SUBLINGUAL | Status: DC | PRN

## 2016-12-02 MED ORDER — ATORVASTATIN CALCIUM 10 MG PO TABS: 10.0000 mg | ORAL_TABLET | Freq: Every day | ORAL | Status: DC

## 2016-12-02 MED ORDER — MEXILETINE HCL 150 MG PO CAPS
150.0000 mg | ORAL_CAPSULE | Freq: Two times a day (BID) | ORAL | Status: DC
  Filled 2016-12-02 (×2): qty 1

## 2016-12-02 MED ORDER — AMIODARONE HCL 200 MG PO TABS
400.0000 mg | ORAL_TABLET | Freq: Every day | ORAL | Status: DC
  Administered 2016-12-02: 400 mg via ORAL
  Filled 2016-12-02: qty 2

## 2016-12-02 MED ORDER — METOPROLOL SUCCINATE ER 50 MG PO TB24: 25.0000 mg | ORAL_TABLET | Freq: Two times a day (BID) | ORAL | Status: DC

## 2016-12-02 MED ORDER — HEPARIN (PORCINE) IN NACL 100-0.45 UNIT/ML-% IJ SOLN
850.0000 [IU]/h | INTRAMUSCULAR | Status: DC
  Administered 2016-12-02: 950 [IU]/h via INTRAVENOUS
  Filled 2016-12-02: qty 250

## 2016-12-02 MED ORDER — ASPIRIN EC 81 MG PO TBEC: 81.0000 mg | DELAYED_RELEASE_TABLET | Freq: Every day | ORAL | Status: DC

## 2016-12-02 MED ORDER — SPIRONOLACTONE 25 MG PO TABS
25.0000 mg | ORAL_TABLET | Freq: Every day | ORAL | Status: DC
  Administered 2016-12-02: 25 mg via ORAL
  Filled 2016-12-02: qty 1

## 2016-12-02 MED ORDER — HEPARIN BOLUS VIA INFUSION
4000.0000 [IU] | Freq: Once | INTRAVENOUS | Status: AC
  Administered 2016-12-02: 4000 [IU] via INTRAVENOUS
  Filled 2016-12-02: qty 4000

## 2016-12-02 NOTE — Progress Notes (Signed)
Pt discharged to home via wc.  Instructions  given to pt.  Questions answered.  No distress.  

## 2016-12-02 NOTE — Progress Notes (Signed)
ANTICOAGULATION CONSULT NOTE - Initial Consult  Pharmacy Consult for heparin drip Indication: chest pain/ACS  Allergies  Allergen Reactions  . Pravastatin Other (See Comments)    Reaction:  Joint pain     Patient Measurements: Height: 5\' 8"  (172.7 cm) Weight: 175 lb (79.4 kg) IBW/kg (Calculated) : 68.4 Heparin Dosing Weight: 79kg  Vital Signs: Temp: 97.5 F (36.4 C) (11/27 0247) Temp Source: Oral (11/27 0247) BP: 128/61 (11/27 0247) Pulse Rate: 75 (11/27 0247)  Labs: Recent Labs    12/01/16 2244  HGB 12.8*  HCT 38.7*  PLT 160  CREATININE 1.20  TROPONINI 0.18*    Estimated Creatinine Clearance: 50.7 mL/min (by C-G formula based on SCr of 1.2 mg/dL).   Medical History: Past Medical History:  Diagnosis Date  . AICD (automatic cardioverter/defibrillator) present    [employer]  . Arthritis    RA  . Balance problem   . CHF (congestive heart failure) ([place])   . Collagen vascular disease ([place])    RA since 2014  . Coronary artery disease   . Deviated nasal septum   . Dysrhythmia   . Hearing loss    TINNITUS  . High cholesterol   . Leg weakness   . Myocardial infarction ([place])   . Presence of permanent cardiac pacemaker    WITH DEFIBRILATOR ([employer])  . Seasonal allergies    RHINITIS  . Shortness of breath dyspnea    COUGH  . Sinus headache   . Sinusitis    CHRONIC  . Sleep apnea   . Vertigo    DYSEQUILIBRIUM    Medications:  No anticoagulation in PTA meds.  Assessment:   Goal of Therapy:  Heparin level 0.3-0.7 units/ml Monitor platelets by anticoagulation protocol: Yes   Plan:  4000 unit bolus and initial rate of 950 units/hr. First heparin level 8 hours after start of infusion.  Jewel Mcafee S 12/02/2016,3:35 AM

## 2016-12-02 NOTE — Consult Note (Signed)
Reason for Consult:V Tach, Syncope, ACID discharge Referring Physician: Dr Fritzi Mandes hospitalist, Estell Harpin MD primary Cardioologist Dr Jeralene Peters is an 76 y.o. male.  HPI: 76 year old white male known history of coronary disease CAD in place and pacemaker recently had an episode of syncope brought to the emergency room interrogation suggested AICD discharge for ventricular tachycardia with a rate of   240.  Patient gives a history of known coronary disease known cardiomyopathy heart failure therapy which appears to be compensated has had episodes of weakness intermittently but denies any significant palpitations or tachycardia is been followed by EP Dr. Marcello Moores at [facility].  Patient is currently on amiodarone and mexiletine his last AICD discharge was 2016 when amiodarone was reduced.  Patient denied any significant chest pain with this episode and feels much improved now had borderline troponins.  Past Medical History:  Diagnosis Date  . AICD (automatic cardioverter/defibrillator) present    [employer]  . Arthritis    RA  . Balance problem   . CHF (congestive heart failure) ([place])   . Collagen vascular disease ([place])    RA since 2014  . Coronary artery disease   . Deviated nasal septum   . Dysrhythmia   . Hearing loss    TINNITUS  . High cholesterol   . Leg weakness   . Myocardial infarction ([place])   . Presence of permanent cardiac pacemaker    WITH DEFIBRILATOR ([employer])  . Seasonal allergies    RHINITIS  . Shortness of breath dyspnea    COUGH  . Sinus headache   . Sinusitis    CHRONIC  . Sleep apnea   . Vertigo    DYSEQUILIBRIUM    Past Surgical History:  Procedure Laterality Date  . CARDIAC SURGERY    . CATARACT EXTRACTION     BILATERAL  . CORONARY ANGIOPLASTY     STENTS  . CORONARY ARTERY BYPASS GRAFT  1992  . heart pump    . INSERT / REPLACE / REMOVE PACEMAKER    . SEPTOPLASTY      Family History  Problem Relation Age of Onset  .  Alzheimer's disease Mother     Social History:  reports that  has never smoked. he has never used smokeless tobacco. He reports that he does not drink alcohol or use drugs.  Allergies:  Allergies  Allergen Reactions  . Pravastatin Other (See Comments)    Reaction:  Joint pain     Medications: I have reviewed the patient's current medications.  Results for orders placed or performed during the hospital encounter of 12/01/16 (from the past 48 hour(s))  CBC     Status: Abnormal   Collection Time: 12/01/16 10:44 PM  Result Value Ref Range   WBC 8.2 3.8 - 10.6 K/uL   RBC 4.06 (L) 4.40 - 5.90 MIL/uL   Hemoglobin 12.8 (L) 13.0 - 18.0 g/dL   HCT 38.7 (L) 40.0 - 52.0 %   MCV 95.3 80.0 - 100.0 fL   MCH 31.5 26.0 - 34.0 pg   MCHC 33.1 32.0 - 36.0 g/dL   RDW 14.6 (H) 11.5 - 14.5 %   Platelets 160 150 - 440 K/uL  Comprehensive metabolic panel     Status: Abnormal   Collection Time: 12/01/16 10:44 PM  Result Value Ref Range   Sodium 135 135 - 145 mmol/L   Potassium 4.4 3.5 - 5.1 mmol/L   Chloride 99 (L) 101 - 111 mmol/L   CO2 24 22 -  32 mmol/L   Glucose, Bld 140 (H) 65 - 99 mg/dL   BUN 33 (H) 6 - 20 mg/dL   Creatinine, Ser 1.20 0.61 - 1.24 mg/dL   Calcium 8.5 (L) 8.9 - 10.3 mg/dL   Total Protein 6.6 6.5 - 8.1 g/dL   Albumin 3.2 (L) 3.5 - 5.0 g/dL   AST 48 (H) 15 - 41 U/L   ALT 38 17 - 63 U/L   Alkaline Phosphatase 39 38 - 126 U/L   Total Bilirubin 0.7 0.3 - 1.2 mg/dL   GFR calc non Af Amer 57 (L) >60 mL/min   GFR calc Af Amer >60 >60 mL/min    Comment: (NOTE) The eGFR has been calculated using the CKD EPI equation. This calculation has not been validated in all clinical situations. eGFR's persistently <60 mL/min signify possible Chronic Kidney Disease.    Anion gap 12 5 - 15  Ammonia     Status: None   Collection Time: 12/01/16 10:44 PM  Result Value Ref Range   Ammonia 16 9 - 35 umol/L  Ethanol     Status: None   Collection Time: 12/01/16 10:44 PM  Result Value Ref Range    Alcohol, Ethyl (B) <10 <10 mg/dL    Comment:        LOWEST DETECTABLE LIMIT FOR SERUM ALCOHOL IS 10 mg/dL FOR MEDICAL PURPOSES ONLY   Troponin I     Status: Abnormal   Collection Time: 12/01/16 10:44 PM  Result Value Ref Range   Troponin I 0.18 (HH) <0.03 ng/mL    Comment: CRITICAL RESULT CALLED TO, READ BACK BY AND VERIFIED WITH DAJEA SCOTT AT 2340 12/01/16 ALV   Urinalysis, Routine w reflex microscopic     Status: Abnormal   Collection Time: 12/02/16 12:55 AM  Result Value Ref Range   Color, Urine YELLOW (A) YELLOW   APPearance CLEAR (A) CLEAR   Specific Gravity, Urine 1.015 1.005 - 1.030   pH 6.0 5.0 - 8.0   Glucose, UA NEGATIVE NEGATIVE mg/dL   Hgb urine dipstick NEGATIVE NEGATIVE   Bilirubin Urine NEGATIVE NEGATIVE   Ketones, ur NEGATIVE NEGATIVE mg/dL   Protein, ur NEGATIVE NEGATIVE mg/dL   Nitrite NEGATIVE NEGATIVE   Leukocytes, UA NEGATIVE NEGATIVE  Troponin I     Status: Abnormal   Collection Time: 12/02/16  2:59 AM  Result Value Ref Range   Troponin I 0.21 (HH) <0.03 ng/mL    Comment: CRITICAL VALUE NOTED. VALUE IS CONSISTENT WITH PREVIOUSLY REPORTED/CALLED VALUE ALV  APTT     Status: Abnormal   Collection Time: 12/02/16  2:59 AM  Result Value Ref Range   aPTT <24 (L) 24 - 36 seconds  Protime-INR     Status: None   Collection Time: 12/02/16  2:59 AM  Result Value Ref Range   Prothrombin Time 12.5 11.4 - 15.2 seconds   INR 0.94   Troponin I     Status: Abnormal   Collection Time: 12/02/16  8:22 AM  Result Value Ref Range   Troponin I 0.21 (HH) <0.03 ng/mL    Comment: CRITICAL VALUE NOTED.  VALUE IS CONSISTENT WITH PREVIOUSLY REPORTED AND CALLED VALUE. DAS   Heparin level (unfractionated)     Status: Abnormal   Collection Time: 12/02/16 11:44 AM  Result Value Ref Range   Heparin Unfractionated 0.71 (H) 0.30 - 0.70 IU/mL    Comment:        IF HEPARIN RESULTS ARE BELOW EXPECTED VALUES, AND PATIENT DOSAGE HAS BEEN CONFIRMED,  SUGGEST FOLLOW UP  TESTING OF ANTITHROMBIN III LEVELS.   CBC     Status: Abnormal   Collection Time: 12/02/16 11:44 AM  Result Value Ref Range   WBC 9.2 3.8 - 10.6 K/uL   RBC 3.98 (L) 4.40 - 5.90 MIL/uL   Hemoglobin 12.7 (L) 13.0 - 18.0 g/dL   HCT 38.3 (L) 40.0 - 52.0 %   MCV 96.2 80.0 - 100.0 fL   MCH 31.8 26.0 - 34.0 pg   MCHC 33.1 32.0 - 36.0 g/dL   RDW 14.5 11.5 - 14.5 %   Platelets 154 150 - 440 K/uL    Ct Head Wo Contrast  Result Date: 12/01/2016 CLINICAL DATA:  Dizziness and fall. Posttraumatic headache. Cervical neck pain. Laceration to back of head. EXAM: CT HEAD WITHOUT CONTRAST CT CERVICAL SPINE WITHOUT CONTRAST TECHNIQUE: Multidetector CT imaging of the head and cervical spine was performed following the standard protocol without intravenous contrast. Multiplanar CT image reconstructions of the cervical spine were also generated. COMPARISON:  Head CT 11/07/2016. Head and cervical spine CT 09/14/2014 FINDINGS: CT HEAD FINDINGS Brain: Stable degree of atrophy and chronic small vessel ischemia. No intracranial hemorrhage, mass effect, or midline shift. No hydrocephalus. The basilar cisterns are patent. No evidence of territorial infarct or acute ischemia. No extra-axial or intracranial fluid collection. Vascular: Atherosclerosis of skullbase vasculature without hyperdense vessel or abnormal calcification. Skull: No fracture or focal lesion. Sinuses/Orbits: Chronic opacification of lower left mastoid air cells. Chronic mucosal thickening of the right maxillary sinus with central hyperdensity. Bilateral cataract resection. Other: None. CT CERVICAL SPINE FINDINGS Alignment: Minimal anterolisthesis of C7 on T1 is unchanged from prior exam. No traumatic subluxation. C1 is well aligned on C2. Skull base and vertebrae: No acute fracture. Vertebral body heights are maintained. The dens and skull base are intact. Soft tissues and spinal canal: No prevertebral fluid or swelling. No visible canal hematoma. Disc  levels: Diffuse disc space narrowing and endplate spurring, findings most prominent at C4-C5. Scattered facet arthropathy. Upper chest: No acute abnormality. Other: Carotid calcifications. IMPRESSION: 1. No acute intracranial abnormality. No skull fracture. Stable atrophy and chronic small vessel ischemia. 2. No acute fracture or subluxation of the cervical spine. Multilevel degenerative change. Electronically Signed   By: Jeb Levering M.D.   On: 12/01/2016 23:24   Ct Cervical Spine Wo Contrast  Result Date: 12/01/2016 CLINICAL DATA:  Dizziness and fall. Posttraumatic headache. Cervical neck pain. Laceration to back of head. EXAM: CT HEAD WITHOUT CONTRAST CT CERVICAL SPINE WITHOUT CONTRAST TECHNIQUE: Multidetector CT imaging of the head and cervical spine was performed following the standard protocol without intravenous contrast. Multiplanar CT image reconstructions of the cervical spine were also generated. COMPARISON:  Head CT 11/07/2016. Head and cervical spine CT 09/14/2014 FINDINGS: CT HEAD FINDINGS Brain: Stable degree of atrophy and chronic small vessel ischemia. No intracranial hemorrhage, mass effect, or midline shift. No hydrocephalus. The basilar cisterns are patent. No evidence of territorial infarct or acute ischemia. No extra-axial or intracranial fluid collection. Vascular: Atherosclerosis of skullbase vasculature without hyperdense vessel or abnormal calcification. Skull: No fracture or focal lesion. Sinuses/Orbits: Chronic opacification of lower left mastoid air cells. Chronic mucosal thickening of the right maxillary sinus with central hyperdensity. Bilateral cataract resection. Other: None. CT CERVICAL SPINE FINDINGS Alignment: Minimal anterolisthesis of C7 on T1 is unchanged from prior exam. No traumatic subluxation. C1 is well aligned on C2. Skull base and vertebrae: No acute fracture. Vertebral body heights are maintained. The dens and skull base  are intact. Soft tissues and spinal  canal: No prevertebral fluid or swelling. No visible canal hematoma. Disc levels: Diffuse disc space narrowing and endplate spurring, findings most prominent at C4-C5. Scattered facet arthropathy. Upper chest: No acute abnormality. Other: Carotid calcifications. IMPRESSION: 1. No acute intracranial abnormality. No skull fracture. Stable atrophy and chronic small vessel ischemia. 2. No acute fracture or subluxation of the cervical spine. Multilevel degenerative change. Electronically Signed   By: Jeb Levering M.D.   On: 12/01/2016 23:24    Review of Systems  Constitutional: Positive for malaise/fatigue.  HENT: Negative.   Eyes: Negative.   Respiratory: Positive for shortness of breath.   Cardiovascular: Positive for palpitations, leg swelling and PND.  Gastrointestinal: Negative.   Genitourinary: Negative.   Musculoskeletal: Positive for myalgias.  Skin: Negative.   Neurological: Positive for loss of consciousness and weakness.  Endo/Heme/Allergies: Negative.   Psychiatric/Behavioral: Negative.    Blood pressure (!) 99/56, pulse 66, temperature 97.7 F (36.5 C), temperature source Oral, resp. rate 17, height _0  (1.727 m), weight 175 lb (79.4 kg), SpO2 93 %. Physical Exam  Nursing note and vitals reviewed. Constitutional: He is oriented to person, place, and time. He appears well-developed and well-nourished.  HENT:  Head: Normocephalic and atraumatic.  Eyes: Conjunctivae and EOM are normal. Pupils are equal, round, and reactive to light.  Neck: Normal range of motion. Neck supple.  Cardiovascular: Normal rate and regular rhythm. Exam reveals gallop.  Respiratory: Effort normal and breath sounds normal.  GI: Soft. Bowel sounds are normal. There is tenderness.  Musculoskeletal: Normal range of motion.  Neurological: He is alert and oriented to person, place, and time. He has normal reflexes.  Skin: Skin is warm and dry.  Psychiatric: He has a normal mood and affect.     Assessment/Plan: V tach rate 240 Syncope AICD discharge CHF CM CAD RA Borderline troponin . PLAN D/C heparin IV demand ischemia from AICD discharge Agree with AICD Continue Amiodarone 475m QD Increase Metoprolol 50 mg BID Continue Mexitil 150 mg bid Agree with spironolactone   Ok to d/c home today  F/U with Fath 1-2 weeks   [name] 12/02/2016, 2:00 PM

## 2016-12-02 NOTE — H&P (Signed)
[facility] [place] NAME: [name]    MR#:  [id number]  DATE OF BIRTH:  09-28-1940  DATE OF ADMISSION:  12/01/2016  PRIMARY CARE PHYSICIAN: [name], MD   REQUESTING/REFERRING PHYSICIAN:   CHIEF COMPLAINT:   Chief Complaint  Patient presents with  . Fall  . Laceration    HISTORY OF PRESENT ILLNESS: [name]  is a 76 y.o. male with a known history of AICD placement, pacemaker, arthritis, congestive heart failure, collagen vascular disease, coronary artery disease, hyperlipidemia presented to the emergency for fall and laceration. Patient went out for an ice cream and was walking back inside became dizzy and fell down. He has a laceration in the back of the head. He lost consciousness for a few minutes before he recovered. No evidence of any seizure according to patient's wife. Patient has history of vertigo for the last 2 years and also has chronic headaches. No complaints of any chest pain. Patient was evaluated in the emergency room his troponin was elevated second set. Hospitalist service was consulted for further care. He was worked up with CT head which showed no acute abnormality and CT cervical spine which showed no fractures.  PAST MEDICAL HISTORY:   Past Medical History:  Diagnosis Date  . AICD (automatic cardioverter/defibrillator) present    [employer]  . Arthritis    RA  . Balance problem   . CHF (congestive heart failure) ([place])   . Collagen vascular disease ([place])    RA since 2014  . Coronary artery disease   . Deviated nasal septum   . Dysrhythmia   . Hearing loss    TINNITUS  . High cholesterol   . Leg weakness   . Myocardial infarction ([place])   . Presence of permanent cardiac pacemaker    WITH DEFIBRILATOR ([employer])  . Seasonal allergies    RHINITIS  . Shortness of breath dyspnea    COUGH  . Sinus headache   . Sinusitis    CHRONIC  . Sleep apnea   . Vertigo     DYSEQUILIBRIUM    PAST SURGICAL HISTORY:  Past Surgical History:  Procedure Laterality Date  . CARDIAC SURGERY    . CATARACT EXTRACTION     BILATERAL  . CORONARY ANGIOPLASTY     STENTS  . CORONARY ARTERY BYPASS GRAFT  1992  . heart pump    . INSERT / REPLACE / REMOVE PACEMAKER    . SEPTOPLASTY      SOCIAL HISTORY:  Social History   Tobacco Use  . Smoking status: Never Smoker  . Smokeless tobacco: Never Used  Substance Use Topics  . Alcohol use: No    Alcohol/week: 0.0 oz    FAMILY HISTORY:  Family History  Problem Relation Age of Onset  . Alzheimer's disease Mother     DRUG ALLERGIES:  Allergies  Allergen Reactions  . Pravastatin Other (See Comments)    Reaction:  Joint pain     REVIEW OF SYSTEMS:   CONSTITUTIONAL: No fever, fatigue or weakness.  EYES: No blurred or double vision.  EARS, NOSE, AND THROAT: No tinnitus or ear pain.  RESPIRATORY: No cough, shortness of breath, wheezing or hemoptysis.  CARDIOVASCULAR: No chest pain, orthopnea, edema.  GASTROINTESTINAL: No nausea, vomiting, diarrhea or abdominal pain.  GENITOURINARY: No dysuria, hematuria.  ENDOCRINE: No polyuria, nocturia,  HEMATOLOGY: No anemia, easy bruising or bleeding SKIN: Laceration posterior scalp area. MUSCULOSKELETAL: No joint pain or arthritis.  NEUROLOGIC: No tingling, numbness, weakness.  Felt dizzy and passed out. PSYCHIATRY: No anxiety or depression.   MEDICATIONS AT HOME:  Prior to Admission medications   Medication Sig Start Date End Date Taking? Authorizing Provider  acetaminophen (TYLENOL) 325 MG tablet Take 2 tablets (650 mg total) by mouth every 6 (six) hours as needed for mild pain (or Fever >/= 101). 07/18/14  Yes Gouru, Aruna, MD  amiodarone (PACERONE) 400 MG tablet Take 400 mg by mouth daily.   Yes [provider]  aspirin EC 81 MG tablet Take 81 mg by mouth daily.   Yes [provider]  cetirizine (ZYRTEC) 10 MG tablet Take 10 mg by mouth daily.    Yes [provider]  Cholecalciferol (VITAMIN D3) 2000 UNITS TABS Take 2,000 Units by mouth daily.    Yes [provider]  docusate sodium (COLACE) 100 MG capsule Take 100 mg by mouth every evening.   Yes [provider]  fluticasone (FLONASE) 50 MCG/ACT nasal spray Place 1 spray into both nostrils daily as needed for rhinitis.    Yes [provider]  furosemide (LASIX) 40 MG tablet Take 40 mg by mouth daily as needed for edema.   Yes [provider]  metoprolol succinate (TOPROL-XL) 25 MG 24 hr tablet Take 25 mg by mouth 2 (two) times daily. Take 50mg  every morning and 25mg  every evening.   Yes [provider]  mexiletine (MEXITIL) 150 MG capsule Take 150 mg by mouth 2 (two) times daily.   Yes [provider]  nitroGLYCERIN (NITROSTAT) 0.4 MG SL tablet Place 0.4 mg under the tongue every 5 (five) minutes as needed for chest pain.   Yes [provider]  spironolactone (ALDACTONE) 25 MG tablet Take 25 mg by mouth daily.   Yes [provider]      PHYSICAL EXAMINATION:   VITAL SIGNS: Blood pressure 128/61, pulse 75, temperature (!) 97.5 F (36.4 C), temperature source Oral, resp. rate (!) 23, height 5\' 8"  (1.727 m), weight 79.4 kg (175 lb), SpO2 95 %.  GENERAL:  76 y.o.-year-old patient lying in the bed with no acute distress.  EYES: Pupils equal, round, reactive to light and accommodation. No scleral icterus. Extraocular muscles intact.  HEENT: Head atraumatic, normocephalic. Oropharynx and nasopharynx clear.  NECK:  Supple, no jugular venous distention. No thyroid enlargement, no tenderness.  LUNGS: Normal breath sounds bilaterally, no wheezing, rales,rhonchi or crepitation. No use of accessory muscles of respiration.  CARDIOVASCULAR: S1, S2 normal. No murmurs, rubs, or gallops.  ABDOMEN: Soft, nontender, nondistended. Bowel sounds present. No organomegaly or mass.  EXTREMITIES: No pedal edema, cyanosis, or  clubbing.  NEUROLOGIC: Cranial nerves II through XII are intact. Muscle strength 5/5 in all extremities. Sensation intact. Gait not checked.  PSYCHIATRIC: The patient is alert and oriented x 3.  SKIN: Laceration of skin occipital area  LABORATORY PANEL:   CBC Recent Labs  Lab 12/01/16 2244  WBC 8.2  HGB 12.8*  HCT 38.7*  PLT 160  MCV 95.3  MCH 31.5  MCHC 33.1  RDW 14.6*   ------------------------------------------------------------------------------------------------------------------  Chemistries  Recent Labs  Lab 12/01/16 2244  NA 135  K 4.4  CL 99*  CO2 24  GLUCOSE 140*  BUN 33*  CREATININE 1.20  CALCIUM 8.5*  AST 48*  ALT 38  ALKPHOS 39  BILITOT 0.7   ------------------------------------------------------------------------------------------------------------------ estimated creatinine clearance is 50.7 mL/min (by C-G formula based on SCr of 1.2 mg/dL). ------------------------------------------------------------------------------------------------------------------ No results for input(s): TSH, T4TOTAL,  T3FREE, THYROIDAB in the last 72 hours.  Invalid input(s): FREET3   Coagulation profile No results for input(s): INR, PROTIME in the last 168 hours. ------------------------------------------------------------------------------------------------------------------- No results for input(s): DDIMER in the last 72 hours. -------------------------------------------------------------------------------------------------------------------  Cardiac Enzymes Recent Labs  Lab 12/01/16 2244  TROPONINI 0.18*   ------------------------------------------------------------------------------------------------------------------ Invalid input(s): POCBNP  ---------------------------------------------------------------------------------------------------------------  Urinalysis    Component Value Date/Time   COLORURINE YELLOW (A) 12/02/2016 0055   APPEARANCEUR CLEAR  (A) 12/02/2016 0055   APPEARANCEUR Clear 09/28/2013 0807   LABSPEC 1.015 12/02/2016 0055   LABSPEC 1.015 09/28/2013 0807   PHURINE 6.0 12/02/2016 0055   GLUCOSEU NEGATIVE 12/02/2016 0055   GLUCOSEU Negative 09/28/2013 0807   HGBUR NEGATIVE 12/02/2016 0055   BILIRUBINUR NEGATIVE 12/02/2016 0055   BILIRUBINUR Negative 09/28/2013 0807   KETONESUR NEGATIVE 12/02/2016 0055   PROTEINUR NEGATIVE 12/02/2016 0055   NITRITE NEGATIVE 12/02/2016 0055   LEUKOCYTESUR NEGATIVE 12/02/2016 0055   LEUKOCYTESUR Negative 09/28/2013 0807     RADIOLOGY: Ct Head Wo Contrast  Result Date: 12/01/2016 CLINICAL DATA:  Dizziness and fall. Posttraumatic headache. Cervical neck pain. Laceration to back of head. EXAM: CT HEAD WITHOUT CONTRAST CT CERVICAL SPINE WITHOUT CONTRAST TECHNIQUE: Multidetector CT imaging of the head and cervical spine was performed following the standard protocol without intravenous contrast. Multiplanar CT image reconstructions of the cervical spine were also generated. COMPARISON:  Head CT 11/07/2016. Head and cervical spine CT 09/14/2014 FINDINGS: CT HEAD FINDINGS Brain: Stable degree of atrophy and chronic small vessel ischemia. No intracranial hemorrhage, mass effect, or midline shift. No hydrocephalus. The basilar cisterns are patent. No evidence of territorial infarct or acute ischemia. No extra-axial or intracranial fluid collection. Vascular: Atherosclerosis of skullbase vasculature without hyperdense vessel or abnormal calcification. Skull: No fracture or focal lesion. Sinuses/Orbits: Chronic opacification of lower left mastoid air cells. Chronic mucosal thickening of the right maxillary sinus with central hyperdensity. Bilateral cataract resection. Other: None. CT CERVICAL SPINE FINDINGS Alignment: Minimal anterolisthesis of C7 on T1 is unchanged from prior exam. No traumatic subluxation. C1 is well aligned on C2. Skull base and vertebrae: No acute fracture. Vertebral body heights are  maintained. The dens and skull base are intact. Soft tissues and spinal canal: No prevertebral fluid or swelling. No visible canal hematoma. Disc levels: Diffuse disc space narrowing and endplate spurring, findings most prominent at C4-C5. Scattered facet arthropathy. Upper chest: No acute abnormality. Other: Carotid calcifications. IMPRESSION: 1. No acute intracranial abnormality. No skull fracture. Stable atrophy and chronic small vessel ischemia. 2. No acute fracture or subluxation of the cervical spine. Multilevel degenerative change. Electronically Signed   By: Jeb Levering M.D.   On: 12/01/2016 23:24   Ct Cervical Spine Wo Contrast  Result Date: 12/01/2016 CLINICAL DATA:  Dizziness and fall. Posttraumatic headache. Cervical neck pain. Laceration to back of head. EXAM: CT HEAD WITHOUT CONTRAST CT CERVICAL SPINE WITHOUT CONTRAST TECHNIQUE: Multidetector CT imaging of the head and cervical spine was performed following the standard protocol without intravenous contrast. Multiplanar CT image reconstructions of the cervical spine were also generated. COMPARISON:  Head CT 11/07/2016. Head and cervical spine CT 09/14/2014 FINDINGS: CT HEAD FINDINGS Brain: Stable degree of atrophy and chronic small vessel ischemia. No intracranial hemorrhage, mass effect, or midline shift. No hydrocephalus. The basilar cisterns are patent. No evidence of territorial infarct or acute ischemia. No extra-axial or intracranial fluid collection. Vascular: Atherosclerosis of skullbase vasculature without hyperdense vessel or abnormal calcification. Skull: No fracture or focal lesion. Sinuses/Orbits: Chronic opacification of lower  left mastoid air cells. Chronic mucosal thickening of the right maxillary sinus with central hyperdensity. Bilateral cataract resection. Other: None. CT CERVICAL SPINE FINDINGS Alignment: Minimal anterolisthesis of C7 on T1 is unchanged from prior exam. No traumatic subluxation. C1 is well aligned on C2.  Skull base and vertebrae: No acute fracture. Vertebral body heights are maintained. The dens and skull base are intact. Soft tissues and spinal canal: No prevertebral fluid or swelling. No visible canal hematoma. Disc levels: Diffuse disc space narrowing and endplate spurring, findings most prominent at C4-C5. Scattered facet arthropathy. Upper chest: No acute abnormality. Other: Carotid calcifications. IMPRESSION: 1. No acute intracranial abnormality. No skull fracture. Stable atrophy and chronic small vessel ischemia. 2. No acute fracture or subluxation of the cervical spine. Multilevel degenerative change. Electronically Signed   By: Jeb Levering M.D.   On: 12/01/2016 23:24    EKG: Orders placed or performed during the hospital encounter of 12/07/14  . ED EKG  . ED EKG  . EKG 12-Lead  . EKG 12-Lead    IMPRESSION AND PLAN:  76 year old male patient with history of coronary artery disease, congestive heart failure, collagenous vascular disease, hypertension, hyperlipidemia, AICD placement presented to the emergency room with fall and laceration. Patient passed out.  Admitting diagnosis 1. Syncope 2. Non-STEMI 3. Coronary artery disease 4. Hyperlipidemia Treatment plan Admit patient to telemetry Start patient on IV heparin drip for anticoagulation Cycle troponin Check echocardiogram Cardiology consultation Resume amiodarone and aspirin   All the records are reviewed and case discussed with ED provider. Management plans discussed with the patient, family and they are in agreement.  CODE STATUS:DNR    Code Status Orders  (From admission, onward)        Start     Ordered   12/02/16 0226  Do not attempt resuscitation (DNR)  Continuous    Question Answer Comment  In the event of cardiac or respiratory ARREST Do not call a "code blue"   In the event of cardiac or respiratory ARREST Do not perform Intubation, CPR, defibrillation or ACLS   In the event of cardiac or  respiratory ARREST Use medication by any route, position, wound care, and other measures to relive pain and suffering. May use oxygen, suction and manual treatment of airway obstruction as needed for comfort.   Comments Nurse may pronounce      12/02/16 0225    Code Status History    Date Active Date Inactive Code Status Order ID Comments User Context   08/18/2014 22:15 08/20/2014 14:38 DNR [id number]  Loletha Grayer, MD ED   07/15/2014 03:26 07/18/2014 17:28 Full Code [id number]  [name], MD ED       TOTAL TIME TAKING CARE OF THIS PATIENT: 52 minutes.    Saundra Shelling M.D on 12/02/2016 at 2:51 AM  Between 7am to 6pm - Pager - [phone]  After 6pm go to [email or web address] - password [email or web address] [facility]  [facility]  Office  [phone]  CC: Primary care physician; [name], MD

## 2016-12-02 NOTE — Discharge Summary (Addendum)
[name] NAME: [name]    MR#:  [id number]  DATE OF BIRTH:  [birth year]  DATE OF ADMISSION:  12/01/2016 ADMITTING PHYSICIAN: Saundra Shelling, MD  DATE OF DISCHARGE: 12/02/2016  PRIMARY CARE PHYSICIAN: [name], MD    ADMISSION DIAGNOSIS:  Ventricular tachycardia ([place]) [I47.2] Syncope, unspecified syncope type [R55]  DISCHARGE DIAGNOSIS:  Ventricular tachycardia s/p AICD shock--doing well now -Syncope suspected due to VT  SECONDARY DIAGNOSIS:   Past Medical History:  Diagnosis Date  . AICD (automatic cardioverter/defibrillator) present    [employer]  . Arthritis    RA  . Balance problem   . CHF (congestive heart failure) ([place])   . Collagen vascular disease ([place])    RA since 2014  . Coronary artery disease   . Deviated nasal septum   . Dysrhythmia   . Hearing loss    TINNITUS  . High cholesterol   . Leg weakness   . Myocardial infarction ([place])   . Presence of permanent cardiac pacemaker    WITH DEFIBRILATOR ([employer])  . Seasonal allergies    RHINITIS  . Shortness of breath dyspnea    COUGH  . Sinus headache   . Sinusitis    CHRONIC  . Sleep apnea   . Vertigo    DYSEQUILIBRIUM    HOSPITAL COURSE:  [name] is a 76 y.o. male with a past medical history of CHF, AICD implanted, hyperlipidemia, MI, chronic vertigo, chronic headaches who presents to the emergency department after a fall.  According to the patient he states he went outside to get ice cream, was walking back inside and became very dizzy.  He states shortly after we will walking back inside he thinks he passed out.  Patient fell to the floor per wife and hit the back of his head.   1. Syncope with collapse due to VT--s/p AICD shock -on Amiodarone -increase BB to 50 mg bid -seen by Dr Clayborn Bigness. AICD interrogation showed VT of 260 with one shock -doing well -electrolytes ok  2. HTN Cont home  meds  3. CAD  4.DVT prophylaxis on lovenox  Overall stable for d/c Spoke with pt, wife and Dr Clayborn Bigness  CONSULTS OBTAINED:  Treatment Team:  Yolonda Kida, MD  DRUG ALLERGIES:   Allergies  Allergen Reactions  . Pravastatin Other (See Comments)    Reaction:  Joint pain     DISCHARGE MEDICATIONS:   Current Discharge Medication List    CONTINUE these medications which have CHANGED   Details  metoprolol succinate (TOPROL-XL) 50 MG 24 hr tablet Take 1 tablet (50 mg total) by mouth 2 (two) times daily. Qty: 60 tablet, Refills: 1      CONTINUE these medications which have NOT CHANGED   Details  acetaminophen (TYLENOL) 325 MG tablet Take 2 tablets (650 mg total) by mouth every 6 (six) hours as needed for mild pain (or Fever >/= 101).    amiodarone (PACERONE) 400 MG tablet Take 400 mg by mouth daily.    aspirin EC 81 MG tablet Take 81 mg by mouth daily.    cetirizine (ZYRTEC) 10 MG tablet Take 10 mg by mouth daily.    Cholecalciferol (VITAMIN D3) 2000 UNITS TABS Take 2,000 Units by mouth daily.     docusate sodium (COLACE) 100 MG capsule Take 100 mg by mouth every evening.    fluticasone (FLONASE) 50 MCG/ACT nasal spray Place 1 spray into both nostrils daily  as needed for rhinitis.     furosemide (LASIX) 40 MG tablet Take 40 mg by mouth daily as needed for edema.    mexiletine (MEXITIL) 150 MG capsule Take 150 mg by mouth 2 (two) times daily.    nitroGLYCERIN (NITROSTAT) 0.4 MG SL tablet Place 0.4 mg under the tongue every 5 (five) minutes as needed for chest pain.    spironolactone (ALDACTONE) 25 MG tablet Take 25 mg by mouth daily.        If you experience worsening of your admission symptoms, develop shortness of breath, life threatening emergency, suicidal or homicidal thoughts you must seek medical attention immediately by calling 911 or calling your MD immediately  if symptoms less severe.  You Must read complete instructions/literature along with all the  possible adverse reactions/side effects for all the Medicines you take and that have been prescribed to you. Take any new Medicines after you have completely understood and accept all the possible adverse reactions/side effects.   Please note  You were cared for by a hospitalist during your hospital stay. If you have any questions about your discharge medications or the care you received while you were in the hospital after you are discharged, you can call the unit and asked to speak with the hospitalist on call if the hospitalist that took care of you is not available. Once you are discharged, your primary care physician will handle any further medical issues. Please note that NO REFILLS for any discharge medications will be authorized once you are discharged, as it is imperative that you return to your primary care physician (or establish a relationship with a primary care physician if you do not have one) for your aftercare needs so that they can reassess your need for medications and monitor your lab values. Today   SUBJECTIVE   I am ok...want to go home  VITAL SIGNS:  Blood pressure (!) 99/56, pulse 66, temperature 97.7 F (36.5 C), temperature source Oral, resp. rate 17, height 5\' 8"  (1.727 m), weight 79.4 kg (175 lb), SpO2 93 %.  I/O:    Intake/Output Summary (Last 24 hours) at 12/02/2016 1519 Last data filed at 12/02/2016 1318 Gross per 24 hour  Intake 1152.45 ml  Output 700 ml  Net 452.45 ml    PHYSICAL EXAMINATION:  GENERAL:  76 y.o.-year-old patient lying in the bed with no acute distress.  EYES: Pupils equal, round, reactive to light and accommodation. No scleral icterus. Extraocular muscles intact.  HEENT: Head atraumatic, normocephalic. Oropharynx and nasopharynx clear.  NECK:  Supple, no jugular venous distention. No thyroid enlargement, no tenderness.  LUNGS: Normal breath sounds bilaterally, no wheezing, rales,rhonchi or crepitation. No use of accessory muscles of  respiration.  CARDIOVASCULAR: S1, S2 normal. No murmurs, rubs, or gallops.  ABDOMEN: Soft, non-tender, non-distended. Bowel sounds present. No organomegaly or mass.  EXTREMITIES: No pedal edema, cyanosis, or clubbing.  NEUROLOGIC: Cranial nerves II through XII are intact. Muscle strength 5/5 in all extremities. Sensation intact. Gait not checked.  PSYCHIATRIC: The patient is alert and oriented x 3.  SKIN: No obvious rash, lesion, or ulcer.   DATA REVIEW:   CBC  Recent Labs  Lab 12/02/16 1144  WBC 9.2  HGB 12.7*  HCT 38.3*  PLT 154    Chemistries  Recent Labs  Lab 12/01/16 2244 12/02/16 1433  NA 135  --   K 4.4  --   CL 99*  --   CO2 24  --   GLUCOSE 140*  --  BUN 33*  --   CREATININE 1.20  --   CALCIUM 8.5*  --   MG  --  2.3  AST 48*  --   ALT 38  --   ALKPHOS 39  --   BILITOT 0.7  --     Microbiology Results   No results found for this or any previous visit (from the past 240 hour(s)).  RADIOLOGY:  Ct Head Wo Contrast  Result Date: 12/01/2016 CLINICAL DATA:  Dizziness and fall. Posttraumatic headache. Cervical neck pain. Laceration to back of head. EXAM: CT HEAD WITHOUT CONTRAST CT CERVICAL SPINE WITHOUT CONTRAST TECHNIQUE: Multidetector CT imaging of the head and cervical spine was performed following the standard protocol without intravenous contrast. Multiplanar CT image reconstructions of the cervical spine were also generated. COMPARISON:  Head CT 11/07/2016. Head and cervical spine CT 09/14/2014 FINDINGS: CT HEAD FINDINGS Brain: Stable degree of atrophy and chronic small vessel ischemia. No intracranial hemorrhage, mass effect, or midline shift. No hydrocephalus. The basilar cisterns are patent. No evidence of territorial infarct or acute ischemia. No extra-axial or intracranial fluid collection. Vascular: Atherosclerosis of skullbase vasculature without hyperdense vessel or abnormal calcification. Skull: No fracture or focal lesion. Sinuses/Orbits: Chronic  opacification of lower left mastoid air cells. Chronic mucosal thickening of the right maxillary sinus with central hyperdensity. Bilateral cataract resection. Other: None. CT CERVICAL SPINE FINDINGS Alignment: Minimal anterolisthesis of C7 on T1 is unchanged from prior exam. No traumatic subluxation. C1 is well aligned on C2. Skull base and vertebrae: No acute fracture. Vertebral body heights are maintained. The dens and skull base are intact. Soft tissues and spinal canal: No prevertebral fluid or swelling. No visible canal hematoma. Disc levels: Diffuse disc space narrowing and endplate spurring, findings most prominent at C4-C5. Scattered facet arthropathy. Upper chest: No acute abnormality. Other: Carotid calcifications. IMPRESSION: 1. No acute intracranial abnormality. No skull fracture. Stable atrophy and chronic small vessel ischemia. 2. No acute fracture or subluxation of the cervical spine. Multilevel degenerative change. Electronically Signed   By: Jeb Levering M.D.   On: 12/01/2016 23:24   Ct Cervical Spine Wo Contrast  Result Date: 12/01/2016 CLINICAL DATA:  Dizziness and fall. Posttraumatic headache. Cervical neck pain. Laceration to back of head. EXAM: CT HEAD WITHOUT CONTRAST CT CERVICAL SPINE WITHOUT CONTRAST TECHNIQUE: Multidetector CT imaging of the head and cervical spine was performed following the standard protocol without intravenous contrast. Multiplanar CT image reconstructions of the cervical spine were also generated. COMPARISON:  Head CT 11/07/2016. Head and cervical spine CT 09/14/2014 FINDINGS: CT HEAD FINDINGS Brain: Stable degree of atrophy and chronic small vessel ischemia. No intracranial hemorrhage, mass effect, or midline shift. No hydrocephalus. The basilar cisterns are patent. No evidence of territorial infarct or acute ischemia. No extra-axial or intracranial fluid collection. Vascular: Atherosclerosis of skullbase vasculature without hyperdense vessel or abnormal  calcification. Skull: No fracture or focal lesion. Sinuses/Orbits: Chronic opacification of lower left mastoid air cells. Chronic mucosal thickening of the right maxillary sinus with central hyperdensity. Bilateral cataract resection. Other: None. CT CERVICAL SPINE FINDINGS Alignment: Minimal anterolisthesis of C7 on T1 is unchanged from prior exam. No traumatic subluxation. C1 is well aligned on C2. Skull base and vertebrae: No acute fracture. Vertebral body heights are maintained. The dens and skull base are intact. Soft tissues and spinal canal: No prevertebral fluid or swelling. No visible canal hematoma. Disc levels: Diffuse disc space narrowing and endplate spurring, findings most prominent at C4-C5. Scattered facet arthropathy. Upper chest: No  acute abnormality. Other: Carotid calcifications. IMPRESSION: 1. No acute intracranial abnormality. No skull fracture. Stable atrophy and chronic small vessel ischemia. 2. No acute fracture or subluxation of the cervical spine. Multilevel degenerative change. Electronically Signed   By: Jeb Levering M.D.   On: 12/01/2016 23:24     Management plans discussed with the patient, family and they are in agreement.  CODE STATUS:     Code Status Orders  (From admission, onward)        Start     Ordered   12/02/16 0226  Do not attempt resuscitation (DNR)  Continuous    Question Answer Comment  In the event of cardiac or respiratory ARREST Do not call a "code blue"   In the event of cardiac or respiratory ARREST Do not perform Intubation, CPR, defibrillation or ACLS   In the event of cardiac or respiratory ARREST Use medication by any route, position, wound care, and other measures to relive pain and suffering. May use oxygen, suction and manual treatment of airway obstruction as needed for comfort.   Comments Nurse may pronounce      12/02/16 0225    Code Status History    Date Active Date Inactive Code Status Order ID Comments User Context    08/18/2014 22:15 08/20/2014 14:38 DNR [id number]  Loletha Grayer, MD ED   07/15/2014 03:26 07/18/2014 17:28 Full Code [id number]  [name], MD ED    Advance Directive Documentation     Most Recent Value  Type of Advance Directive  Healthcare Power of Attorney, Living will  Pre-existing out of facility DNR order (yellow form or pink MOST form)  No data  "MOST" Form in Place?  No data      TOTAL TIME TAKING CARE OF THIS PATIENT: 40 minutes.    Fritzi Mandes M.D on 12/02/2016 at 3:19 PM  Between 7am to 6pm - Pager - [phone] After 6pm go to [email or web address] - password [email or web address] [facility]  Office  [phone]  CC: Primary care physician; [name], MD

## 2016-12-02 NOTE — Progress Notes (Signed)
ANTICOAGULATION CONSULT NOTE -Follow up [name] for heparin drip Indication: chest pain/ACS  Allergies  Allergen Reactions  . Pravastatin Other (See Comments)    Reaction:  Joint pain     Patient Measurements: Height: 5\' 8"  (172.7 cm) Weight: 175 lb (79.4 kg) IBW/kg (Calculated) : 68.4 Heparin Dosing Weight: 79kg  Vital Signs: Temp: 97.7 F (36.5 C) (11/27 0754) Temp Source: Oral (11/27 0754) BP: 99/56 (11/27 0754) Pulse Rate: 66 (11/27 0754)  Labs: Recent Labs    12/01/16 2244 12/02/16 0259 12/02/16 0822 12/02/16 1144  HGB 12.8*  --   --  12.7*  HCT 38.7*  --   --  38.3*  PLT 160  --   --  154  APTT  --  <24*  --   --   LABPROT  --  12.5  --   --   INR  --  0.94  --   --   HEPARINUNFRC  --   --   --  0.71*  CREATININE 1.20  --   --   --   TROPONINI 0.18* 0.21* 0.21*  --     Estimated Creatinine Clearance: 50.7 mL/min (by C-G formula based on SCr of 1.2 mg/dL).   Medical History: Past Medical History:  Diagnosis Date  . AICD (automatic cardioverter/defibrillator) present    [employer]  . Arthritis    RA  . Balance problem   . CHF (congestive heart failure) ([place])   . Collagen vascular disease ([place])    RA since 2014  . Coronary artery disease   . Deviated nasal septum   . Dysrhythmia   . Hearing loss    TINNITUS  . High cholesterol   . Leg weakness   . Myocardial infarction ([place])   . Presence of permanent cardiac pacemaker    WITH DEFIBRILATOR ([employer])  . Seasonal allergies    RHINITIS  . Shortness of breath dyspnea    COUGH  . Sinus headache   . Sinusitis    CHRONIC  . Sleep apnea   . Vertigo    DYSEQUILIBRIUM    Medications:  No anticoagulation in PTA meds.  Assessment: Heparin currently infusing at 950 units/hr  11/27 1200 Heparin level resulted at 0.71  Goal of Therapy:  Heparin level 0.3-0.7 units/ml Monitor platelets by anticoagulation protocol: Yes   Plan:  Will decrease rate to 850  units/hr and recheck Heparin level in 6 hours. Spoke to RN about changes to infusion rate.  Paulina Fusi, PharmD, BCPS 12/02/2016 1:26 PM

## 2016-12-04 DIAGNOSIS — I255 Ischemic cardiomyopathy: Secondary | ICD-10-CM | POA: Diagnosis not present

## 2016-12-04 DIAGNOSIS — I2510 Atherosclerotic heart disease of native coronary artery without angina pectoris: Secondary | ICD-10-CM | POA: Diagnosis not present

## 2016-12-04 DIAGNOSIS — Z95810 Presence of automatic (implantable) cardiac defibrillator: Secondary | ICD-10-CM | POA: Diagnosis not present

## 2016-12-04 DIAGNOSIS — I472 Ventricular tachycardia: Secondary | ICD-10-CM | POA: Diagnosis not present

## 2016-12-04 DIAGNOSIS — I214 Non-ST elevation (NSTEMI) myocardial infarction: Secondary | ICD-10-CM | POA: Diagnosis not present

## 2016-12-10 DIAGNOSIS — G43719 Chronic migraine without aura, intractable, without status migrainosus: Secondary | ICD-10-CM | POA: Diagnosis not present

## 2016-12-11 DIAGNOSIS — R269 Unspecified abnormalities of gait and mobility: Secondary | ICD-10-CM | POA: Diagnosis not present

## 2016-12-11 DIAGNOSIS — G44321 Chronic post-traumatic headache, intractable: Secondary | ICD-10-CM | POA: Diagnosis not present

## 2016-12-11 DIAGNOSIS — R42 Dizziness and giddiness: Secondary | ICD-10-CM | POA: Diagnosis not present

## 2016-12-17 DIAGNOSIS — G43719 Chronic migraine without aura, intractable, without status migrainosus: Secondary | ICD-10-CM | POA: Diagnosis not present

## 2016-12-23 DIAGNOSIS — R4182 Altered mental status, unspecified: Secondary | ICD-10-CM | POA: Diagnosis not present

## 2016-12-23 DIAGNOSIS — I2510 Atherosclerotic heart disease of native coronary artery without angina pectoris: Secondary | ICD-10-CM | POA: Diagnosis not present

## 2016-12-23 DIAGNOSIS — Z043 Encounter for examination and observation following other accident: Secondary | ICD-10-CM | POA: Diagnosis not present

## 2016-12-23 DIAGNOSIS — W050XXA Fall from non-moving wheelchair, initial encounter: Secondary | ICD-10-CM | POA: Diagnosis not present

## 2016-12-23 DIAGNOSIS — I5022 Chronic systolic (congestive) heart failure: Secondary | ICD-10-CM | POA: Diagnosis not present

## 2016-12-23 DIAGNOSIS — Y92512 Supermarket, store or market as the place of occurrence of the external cause: Secondary | ICD-10-CM | POA: Diagnosis not present

## 2016-12-23 DIAGNOSIS — S199XXA Unspecified injury of neck, initial encounter: Secondary | ICD-10-CM | POA: Diagnosis not present

## 2016-12-23 DIAGNOSIS — S0180XA Unspecified open wound of other part of head, initial encounter: Secondary | ICD-10-CM | POA: Diagnosis not present

## 2016-12-23 DIAGNOSIS — S0990XA Unspecified injury of head, initial encounter: Secondary | ICD-10-CM | POA: Diagnosis not present

## 2016-12-23 DIAGNOSIS — Y33XXXA Other specified events, undetermined intent, initial encounter: Secondary | ICD-10-CM | POA: Diagnosis not present

## 2016-12-23 DIAGNOSIS — E785 Hyperlipidemia, unspecified: Secondary | ICD-10-CM | POA: Diagnosis not present

## 2016-12-23 DIAGNOSIS — R569 Unspecified convulsions: Secondary | ICD-10-CM | POA: Diagnosis not present

## 2016-12-23 DIAGNOSIS — R55 Syncope and collapse: Secondary | ICD-10-CM | POA: Diagnosis not present

## 2016-12-23 DIAGNOSIS — I214 Non-ST elevation (NSTEMI) myocardial infarction: Secondary | ICD-10-CM | POA: Diagnosis not present

## 2016-12-23 DIAGNOSIS — G4089 Other seizures: Secondary | ICD-10-CM | POA: Diagnosis not present

## 2016-12-23 DIAGNOSIS — S01401A Unspecified open wound of right cheek and temporomandibular area, initial encounter: Secondary | ICD-10-CM | POA: Diagnosis not present

## 2016-12-23 DIAGNOSIS — I472 Ventricular tachycardia: Secondary | ICD-10-CM | POA: Diagnosis not present

## 2016-12-23 DIAGNOSIS — R918 Other nonspecific abnormal finding of lung field: Secondary | ICD-10-CM | POA: Diagnosis not present

## 2016-12-23 DIAGNOSIS — I255 Ischemic cardiomyopathy: Secondary | ICD-10-CM | POA: Diagnosis not present

## 2016-12-23 DIAGNOSIS — S069X9A Unspecified intracranial injury with loss of consciousness of unspecified duration, initial encounter: Secondary | ICD-10-CM | POA: Diagnosis not present

## 2016-12-23 DIAGNOSIS — S0993XA Unspecified injury of face, initial encounter: Secondary | ICD-10-CM | POA: Diagnosis not present

## 2016-12-23 DIAGNOSIS — S0181XA Laceration without foreign body of other part of head, initial encounter: Secondary | ICD-10-CM | POA: Diagnosis not present

## 2016-12-23 DIAGNOSIS — J320 Chronic maxillary sinusitis: Secondary | ICD-10-CM | POA: Diagnosis not present

## 2016-12-24 DIAGNOSIS — I4510 Unspecified right bundle-branch block: Secondary | ICD-10-CM | POA: Diagnosis present

## 2016-12-24 DIAGNOSIS — Z8249 Family history of ischemic heart disease and other diseases of the circulatory system: Secondary | ICD-10-CM | POA: Diagnosis not present

## 2016-12-24 DIAGNOSIS — S0990XA Unspecified injury of head, initial encounter: Secondary | ICD-10-CM | POA: Diagnosis not present

## 2016-12-24 DIAGNOSIS — I110 Hypertensive heart disease with heart failure: Secondary | ICD-10-CM | POA: Diagnosis present

## 2016-12-24 DIAGNOSIS — I472 Ventricular tachycardia: Secondary | ICD-10-CM | POA: Diagnosis present

## 2016-12-24 DIAGNOSIS — R55 Syncope and collapse: Secondary | ICD-10-CM | POA: Diagnosis not present

## 2016-12-24 DIAGNOSIS — Z7982 Long term (current) use of aspirin: Secondary | ICD-10-CM | POA: Diagnosis not present

## 2016-12-24 DIAGNOSIS — I5022 Chronic systolic (congestive) heart failure: Secondary | ICD-10-CM | POA: Diagnosis present

## 2016-12-24 DIAGNOSIS — I493 Ventricular premature depolarization: Secondary | ICD-10-CM | POA: Diagnosis not present

## 2016-12-24 DIAGNOSIS — M069 Rheumatoid arthritis, unspecified: Secondary | ICD-10-CM | POA: Diagnosis present

## 2016-12-24 DIAGNOSIS — Z955 Presence of coronary angioplasty implant and graft: Secondary | ICD-10-CM | POA: Diagnosis not present

## 2016-12-24 DIAGNOSIS — I252 Old myocardial infarction: Secondary | ICD-10-CM | POA: Diagnosis not present

## 2016-12-24 DIAGNOSIS — Z9181 History of falling: Secondary | ICD-10-CM | POA: Diagnosis not present

## 2016-12-24 DIAGNOSIS — E079 Disorder of thyroid, unspecified: Secondary | ICD-10-CM | POA: Diagnosis present

## 2016-12-24 DIAGNOSIS — Z4502 Encounter for adjustment and management of automatic implantable cardiac defibrillator: Secondary | ICD-10-CM | POA: Diagnosis not present

## 2016-12-24 DIAGNOSIS — Y33XXXA Other specified events, undetermined intent, initial encounter: Secondary | ICD-10-CM | POA: Diagnosis not present

## 2016-12-24 DIAGNOSIS — E875 Hyperkalemia: Secondary | ICD-10-CM | POA: Diagnosis present

## 2016-12-24 DIAGNOSIS — S0083XA Contusion of other part of head, initial encounter: Secondary | ICD-10-CM | POA: Diagnosis present

## 2016-12-24 DIAGNOSIS — I10 Essential (primary) hypertension: Secondary | ICD-10-CM | POA: Diagnosis not present

## 2016-12-24 DIAGNOSIS — J329 Chronic sinusitis, unspecified: Secondary | ICD-10-CM | POA: Diagnosis present

## 2016-12-24 DIAGNOSIS — I214 Non-ST elevation (NSTEMI) myocardial infarction: Secondary | ICD-10-CM | POA: Diagnosis present

## 2016-12-24 DIAGNOSIS — I255 Ischemic cardiomyopathy: Secondary | ICD-10-CM | POA: Diagnosis present

## 2016-12-24 DIAGNOSIS — M316 Other giant cell arteritis: Secondary | ICD-10-CM | POA: Diagnosis present

## 2016-12-24 DIAGNOSIS — E785 Hyperlipidemia, unspecified: Secondary | ICD-10-CM | POA: Diagnosis present

## 2016-12-24 DIAGNOSIS — Z95810 Presence of automatic (implantable) cardiac defibrillator: Secondary | ICD-10-CM | POA: Diagnosis not present

## 2016-12-24 DIAGNOSIS — I2510 Atherosclerotic heart disease of native coronary artery without angina pectoris: Secondary | ICD-10-CM | POA: Diagnosis present

## 2016-12-24 DIAGNOSIS — R918 Other nonspecific abnormal finding of lung field: Secondary | ICD-10-CM | POA: Diagnosis present

## 2016-12-24 DIAGNOSIS — Z951 Presence of aortocoronary bypass graft: Secondary | ICD-10-CM | POA: Diagnosis not present

## 2016-12-24 DIAGNOSIS — G4730 Sleep apnea, unspecified: Secondary | ICD-10-CM | POA: Diagnosis present

## 2017-01-05 DIAGNOSIS — M545 Low back pain: Secondary | ICD-10-CM | POA: Diagnosis not present

## 2017-01-05 DIAGNOSIS — M069 Rheumatoid arthritis, unspecified: Secondary | ICD-10-CM | POA: Diagnosis not present

## 2017-01-05 DIAGNOSIS — E039 Hypothyroidism, unspecified: Secondary | ICD-10-CM | POA: Diagnosis not present

## 2017-01-05 DIAGNOSIS — I255 Ischemic cardiomyopathy: Secondary | ICD-10-CM | POA: Diagnosis not present

## 2017-01-05 DIAGNOSIS — R531 Weakness: Secondary | ICD-10-CM | POA: Diagnosis not present

## 2017-01-05 DIAGNOSIS — I5022 Chronic systolic (congestive) heart failure: Secondary | ICD-10-CM | POA: Diagnosis not present

## 2017-01-05 DIAGNOSIS — I2510 Atherosclerotic heart disease of native coronary artery without angina pectoris: Secondary | ICD-10-CM | POA: Diagnosis not present

## 2017-01-05 DIAGNOSIS — I110 Hypertensive heart disease with heart failure: Secondary | ICD-10-CM | POA: Diagnosis not present

## 2017-01-05 DIAGNOSIS — G4730 Sleep apnea, unspecified: Secondary | ICD-10-CM | POA: Diagnosis not present

## 2017-01-07 DIAGNOSIS — I2510 Atherosclerotic heart disease of native coronary artery without angina pectoris: Secondary | ICD-10-CM | POA: Diagnosis not present

## 2017-01-07 DIAGNOSIS — M545 Low back pain: Secondary | ICD-10-CM | POA: Diagnosis not present

## 2017-01-07 DIAGNOSIS — M069 Rheumatoid arthritis, unspecified: Secondary | ICD-10-CM | POA: Diagnosis not present

## 2017-01-07 DIAGNOSIS — I255 Ischemic cardiomyopathy: Secondary | ICD-10-CM | POA: Diagnosis not present

## 2017-01-07 DIAGNOSIS — I110 Hypertensive heart disease with heart failure: Secondary | ICD-10-CM | POA: Diagnosis not present

## 2017-01-07 DIAGNOSIS — I5022 Chronic systolic (congestive) heart failure: Secondary | ICD-10-CM | POA: Diagnosis not present

## 2017-01-08 DIAGNOSIS — I255 Ischemic cardiomyopathy: Secondary | ICD-10-CM | POA: Diagnosis not present

## 2017-01-08 DIAGNOSIS — I5022 Chronic systolic (congestive) heart failure: Secondary | ICD-10-CM | POA: Diagnosis not present

## 2017-01-08 DIAGNOSIS — I472 Ventricular tachycardia: Secondary | ICD-10-CM | POA: Diagnosis not present

## 2017-01-08 DIAGNOSIS — I2510 Atherosclerotic heart disease of native coronary artery without angina pectoris: Secondary | ICD-10-CM | POA: Diagnosis not present

## 2017-01-08 DIAGNOSIS — I10 Essential (primary) hypertension: Secondary | ICD-10-CM | POA: Diagnosis not present

## 2017-01-08 DIAGNOSIS — I214 Non-ST elevation (NSTEMI) myocardial infarction: Secondary | ICD-10-CM | POA: Diagnosis not present

## 2017-01-09 DIAGNOSIS — I110 Hypertensive heart disease with heart failure: Secondary | ICD-10-CM | POA: Diagnosis not present

## 2017-01-09 DIAGNOSIS — M545 Low back pain: Secondary | ICD-10-CM | POA: Diagnosis not present

## 2017-01-09 DIAGNOSIS — I5022 Chronic systolic (congestive) heart failure: Secondary | ICD-10-CM | POA: Diagnosis not present

## 2017-01-09 DIAGNOSIS — M069 Rheumatoid arthritis, unspecified: Secondary | ICD-10-CM | POA: Diagnosis not present

## 2017-01-09 DIAGNOSIS — I2510 Atherosclerotic heart disease of native coronary artery without angina pectoris: Secondary | ICD-10-CM | POA: Diagnosis not present

## 2017-01-09 DIAGNOSIS — I255 Ischemic cardiomyopathy: Secondary | ICD-10-CM | POA: Diagnosis not present

## 2017-01-13 DIAGNOSIS — M069 Rheumatoid arthritis, unspecified: Secondary | ICD-10-CM | POA: Diagnosis not present

## 2017-01-13 DIAGNOSIS — L0390 Cellulitis, unspecified: Secondary | ICD-10-CM | POA: Diagnosis not present

## 2017-01-13 DIAGNOSIS — I5022 Chronic systolic (congestive) heart failure: Secondary | ICD-10-CM | POA: Diagnosis not present

## 2017-01-13 DIAGNOSIS — I2510 Atherosclerotic heart disease of native coronary artery without angina pectoris: Secondary | ICD-10-CM | POA: Diagnosis not present

## 2017-01-13 DIAGNOSIS — I110 Hypertensive heart disease with heart failure: Secondary | ICD-10-CM | POA: Diagnosis not present

## 2017-01-13 DIAGNOSIS — M545 Low back pain: Secondary | ICD-10-CM | POA: Diagnosis not present

## 2017-01-13 DIAGNOSIS — I255 Ischemic cardiomyopathy: Secondary | ICD-10-CM | POA: Diagnosis not present

## 2017-01-15 DIAGNOSIS — I5022 Chronic systolic (congestive) heart failure: Secondary | ICD-10-CM | POA: Diagnosis not present

## 2017-01-15 DIAGNOSIS — I2510 Atherosclerotic heart disease of native coronary artery without angina pectoris: Secondary | ICD-10-CM | POA: Diagnosis not present

## 2017-01-15 DIAGNOSIS — I255 Ischemic cardiomyopathy: Secondary | ICD-10-CM | POA: Diagnosis not present

## 2017-01-15 DIAGNOSIS — I110 Hypertensive heart disease with heart failure: Secondary | ICD-10-CM | POA: Diagnosis not present

## 2017-01-15 DIAGNOSIS — M069 Rheumatoid arthritis, unspecified: Secondary | ICD-10-CM | POA: Diagnosis not present

## 2017-01-15 DIAGNOSIS — M545 Low back pain: Secondary | ICD-10-CM | POA: Diagnosis not present

## 2017-01-16 DIAGNOSIS — I509 Heart failure, unspecified: Secondary | ICD-10-CM | POA: Diagnosis not present

## 2017-01-16 DIAGNOSIS — I472 Ventricular tachycardia: Secondary | ICD-10-CM | POA: Diagnosis not present

## 2017-01-16 DIAGNOSIS — L03115 Cellulitis of right lower limb: Secondary | ICD-10-CM | POA: Diagnosis not present

## 2017-01-16 DIAGNOSIS — Z6829 Body mass index (BMI) 29.0-29.9, adult: Secondary | ICD-10-CM | POA: Diagnosis not present

## 2017-01-16 DIAGNOSIS — R51 Headache: Secondary | ICD-10-CM | POA: Diagnosis not present

## 2017-01-20 DIAGNOSIS — I5022 Chronic systolic (congestive) heart failure: Secondary | ICD-10-CM | POA: Diagnosis not present

## 2017-01-20 DIAGNOSIS — M069 Rheumatoid arthritis, unspecified: Secondary | ICD-10-CM | POA: Diagnosis not present

## 2017-01-20 DIAGNOSIS — I110 Hypertensive heart disease with heart failure: Secondary | ICD-10-CM | POA: Diagnosis not present

## 2017-01-20 DIAGNOSIS — I2510 Atherosclerotic heart disease of native coronary artery without angina pectoris: Secondary | ICD-10-CM | POA: Diagnosis not present

## 2017-01-20 DIAGNOSIS — I255 Ischemic cardiomyopathy: Secondary | ICD-10-CM | POA: Diagnosis not present

## 2017-01-20 DIAGNOSIS — M545 Low back pain: Secondary | ICD-10-CM | POA: Diagnosis not present

## 2017-01-21 DIAGNOSIS — I2510 Atherosclerotic heart disease of native coronary artery without angina pectoris: Secondary | ICD-10-CM | POA: Diagnosis not present

## 2017-01-21 DIAGNOSIS — M545 Low back pain: Secondary | ICD-10-CM | POA: Diagnosis not present

## 2017-01-21 DIAGNOSIS — I5022 Chronic systolic (congestive) heart failure: Secondary | ICD-10-CM | POA: Diagnosis not present

## 2017-01-21 DIAGNOSIS — I255 Ischemic cardiomyopathy: Secondary | ICD-10-CM | POA: Diagnosis not present

## 2017-01-21 DIAGNOSIS — M069 Rheumatoid arthritis, unspecified: Secondary | ICD-10-CM | POA: Diagnosis not present

## 2017-01-21 DIAGNOSIS — I110 Hypertensive heart disease with heart failure: Secondary | ICD-10-CM | POA: Diagnosis not present

## 2017-01-22 DIAGNOSIS — Z6826 Body mass index (BMI) 26.0-26.9, adult: Secondary | ICD-10-CM | POA: Diagnosis not present

## 2017-01-22 DIAGNOSIS — E038 Other specified hypothyroidism: Secondary | ICD-10-CM | POA: Diagnosis not present

## 2017-01-22 DIAGNOSIS — L03115 Cellulitis of right lower limb: Secondary | ICD-10-CM | POA: Diagnosis not present

## 2017-01-23 DIAGNOSIS — I472 Ventricular tachycardia: Secondary | ICD-10-CM | POA: Diagnosis not present

## 2017-01-23 DIAGNOSIS — Z4502 Encounter for adjustment and management of automatic implantable cardiac defibrillator: Secondary | ICD-10-CM | POA: Diagnosis not present

## 2017-01-23 DIAGNOSIS — R55 Syncope and collapse: Secondary | ICD-10-CM | POA: Diagnosis not present

## 2017-01-27 DIAGNOSIS — M545 Low back pain: Secondary | ICD-10-CM | POA: Diagnosis not present

## 2017-01-27 DIAGNOSIS — I110 Hypertensive heart disease with heart failure: Secondary | ICD-10-CM | POA: Diagnosis not present

## 2017-01-27 DIAGNOSIS — I5022 Chronic systolic (congestive) heart failure: Secondary | ICD-10-CM | POA: Diagnosis not present

## 2017-01-27 DIAGNOSIS — I2510 Atherosclerotic heart disease of native coronary artery without angina pectoris: Secondary | ICD-10-CM | POA: Diagnosis not present

## 2017-01-27 DIAGNOSIS — M069 Rheumatoid arthritis, unspecified: Secondary | ICD-10-CM | POA: Diagnosis not present

## 2017-01-27 DIAGNOSIS — I255 Ischemic cardiomyopathy: Secondary | ICD-10-CM | POA: Diagnosis not present

## 2017-01-30 DIAGNOSIS — I255 Ischemic cardiomyopathy: Secondary | ICD-10-CM | POA: Diagnosis not present

## 2017-01-30 DIAGNOSIS — I110 Hypertensive heart disease with heart failure: Secondary | ICD-10-CM | POA: Diagnosis not present

## 2017-01-30 DIAGNOSIS — I5022 Chronic systolic (congestive) heart failure: Secondary | ICD-10-CM | POA: Diagnosis not present

## 2017-01-30 DIAGNOSIS — M069 Rheumatoid arthritis, unspecified: Secondary | ICD-10-CM | POA: Diagnosis not present

## 2017-01-30 DIAGNOSIS — I2510 Atherosclerotic heart disease of native coronary artery without angina pectoris: Secondary | ICD-10-CM | POA: Diagnosis not present

## 2017-01-30 DIAGNOSIS — M545 Low back pain: Secondary | ICD-10-CM | POA: Diagnosis not present

## 2017-02-03 DIAGNOSIS — I110 Hypertensive heart disease with heart failure: Secondary | ICD-10-CM | POA: Diagnosis not present

## 2017-02-03 DIAGNOSIS — I5022 Chronic systolic (congestive) heart failure: Secondary | ICD-10-CM | POA: Diagnosis not present

## 2017-02-03 DIAGNOSIS — M545 Low back pain: Secondary | ICD-10-CM | POA: Diagnosis not present

## 2017-02-03 DIAGNOSIS — I2510 Atherosclerotic heart disease of native coronary artery without angina pectoris: Secondary | ICD-10-CM | POA: Diagnosis not present

## 2017-02-03 DIAGNOSIS — M069 Rheumatoid arthritis, unspecified: Secondary | ICD-10-CM | POA: Diagnosis not present

## 2017-02-03 DIAGNOSIS — I255 Ischemic cardiomyopathy: Secondary | ICD-10-CM | POA: Diagnosis not present

## 2017-02-05 DIAGNOSIS — L03115 Cellulitis of right lower limb: Secondary | ICD-10-CM | POA: Diagnosis not present

## 2017-02-05 DIAGNOSIS — Z136 Encounter for screening for cardiovascular disorders: Secondary | ICD-10-CM | POA: Diagnosis not present

## 2017-02-05 DIAGNOSIS — Z125 Encounter for screening for malignant neoplasm of prostate: Secondary | ICD-10-CM | POA: Diagnosis not present

## 2017-02-05 DIAGNOSIS — E785 Hyperlipidemia, unspecified: Secondary | ICD-10-CM | POA: Diagnosis not present

## 2017-02-05 DIAGNOSIS — Z0000 Encounter for general adult medical examination without abnormal findings: Secondary | ICD-10-CM | POA: Diagnosis not present

## 2017-02-05 DIAGNOSIS — Z79899 Other long term (current) drug therapy: Secondary | ICD-10-CM | POA: Diagnosis not present

## 2017-02-05 DIAGNOSIS — Z6829 Body mass index (BMI) 29.0-29.9, adult: Secondary | ICD-10-CM | POA: Diagnosis not present

## 2017-02-06 DIAGNOSIS — I110 Hypertensive heart disease with heart failure: Secondary | ICD-10-CM | POA: Diagnosis not present

## 2017-02-06 DIAGNOSIS — M069 Rheumatoid arthritis, unspecified: Secondary | ICD-10-CM | POA: Diagnosis not present

## 2017-02-06 DIAGNOSIS — I255 Ischemic cardiomyopathy: Secondary | ICD-10-CM | POA: Diagnosis not present

## 2017-02-06 DIAGNOSIS — M545 Low back pain: Secondary | ICD-10-CM | POA: Diagnosis not present

## 2017-02-06 DIAGNOSIS — I2510 Atherosclerotic heart disease of native coronary artery without angina pectoris: Secondary | ICD-10-CM | POA: Diagnosis not present

## 2017-02-06 DIAGNOSIS — I5022 Chronic systolic (congestive) heart failure: Secondary | ICD-10-CM | POA: Diagnosis not present

## 2017-02-10 DIAGNOSIS — I255 Ischemic cardiomyopathy: Secondary | ICD-10-CM | POA: Diagnosis not present

## 2017-02-10 DIAGNOSIS — M069 Rheumatoid arthritis, unspecified: Secondary | ICD-10-CM | POA: Diagnosis not present

## 2017-02-10 DIAGNOSIS — I5022 Chronic systolic (congestive) heart failure: Secondary | ICD-10-CM | POA: Diagnosis not present

## 2017-02-10 DIAGNOSIS — I2510 Atherosclerotic heart disease of native coronary artery without angina pectoris: Secondary | ICD-10-CM | POA: Diagnosis not present

## 2017-02-10 DIAGNOSIS — M545 Low back pain: Secondary | ICD-10-CM | POA: Diagnosis not present

## 2017-02-10 DIAGNOSIS — I110 Hypertensive heart disease with heart failure: Secondary | ICD-10-CM | POA: Diagnosis not present

## 2017-02-12 DIAGNOSIS — H8110 Benign paroxysmal vertigo, unspecified ear: Secondary | ICD-10-CM | POA: Diagnosis not present

## 2017-02-12 DIAGNOSIS — L03115 Cellulitis of right lower limb: Secondary | ICD-10-CM | POA: Diagnosis not present

## 2017-02-12 DIAGNOSIS — E538 Deficiency of other specified B group vitamins: Secondary | ICD-10-CM | POA: Diagnosis not present

## 2017-02-12 DIAGNOSIS — E875 Hyperkalemia: Secondary | ICD-10-CM | POA: Diagnosis not present

## 2017-02-12 DIAGNOSIS — M7918 Myalgia, other site: Secondary | ICD-10-CM | POA: Diagnosis not present

## 2017-02-12 DIAGNOSIS — I509 Heart failure, unspecified: Secondary | ICD-10-CM | POA: Diagnosis not present

## 2017-02-12 DIAGNOSIS — E559 Vitamin D deficiency, unspecified: Secondary | ICD-10-CM | POA: Diagnosis not present

## 2017-02-12 DIAGNOSIS — M069 Rheumatoid arthritis, unspecified: Secondary | ICD-10-CM | POA: Diagnosis not present

## 2017-02-12 DIAGNOSIS — G43719 Chronic migraine without aura, intractable, without status migrainosus: Secondary | ICD-10-CM | POA: Diagnosis not present

## 2017-02-12 DIAGNOSIS — M5481 Occipital neuralgia: Secondary | ICD-10-CM | POA: Diagnosis not present

## 2017-02-12 DIAGNOSIS — R531 Weakness: Secondary | ICD-10-CM | POA: Diagnosis not present

## 2017-02-12 DIAGNOSIS — M316 Other giant cell arteritis: Secondary | ICD-10-CM | POA: Diagnosis not present

## 2017-02-12 DIAGNOSIS — I472 Ventricular tachycardia: Secondary | ICD-10-CM | POA: Diagnosis not present

## 2017-02-12 DIAGNOSIS — R700 Elevated erythrocyte sedimentation rate: Secondary | ICD-10-CM | POA: Diagnosis not present

## 2017-02-12 DIAGNOSIS — E038 Other specified hypothyroidism: Secondary | ICD-10-CM | POA: Diagnosis not present

## 2017-02-13 DIAGNOSIS — I2510 Atherosclerotic heart disease of native coronary artery without angina pectoris: Secondary | ICD-10-CM | POA: Diagnosis not present

## 2017-02-13 DIAGNOSIS — M545 Low back pain: Secondary | ICD-10-CM | POA: Diagnosis not present

## 2017-02-13 DIAGNOSIS — M069 Rheumatoid arthritis, unspecified: Secondary | ICD-10-CM | POA: Diagnosis not present

## 2017-02-13 DIAGNOSIS — I5022 Chronic systolic (congestive) heart failure: Secondary | ICD-10-CM | POA: Diagnosis not present

## 2017-02-13 DIAGNOSIS — I255 Ischemic cardiomyopathy: Secondary | ICD-10-CM | POA: Diagnosis not present

## 2017-02-13 DIAGNOSIS — I110 Hypertensive heart disease with heart failure: Secondary | ICD-10-CM | POA: Diagnosis not present

## 2017-02-17 DIAGNOSIS — I110 Hypertensive heart disease with heart failure: Secondary | ICD-10-CM | POA: Diagnosis not present

## 2017-02-17 DIAGNOSIS — I255 Ischemic cardiomyopathy: Secondary | ICD-10-CM | POA: Diagnosis not present

## 2017-02-17 DIAGNOSIS — M545 Low back pain: Secondary | ICD-10-CM | POA: Diagnosis not present

## 2017-02-17 DIAGNOSIS — I5022 Chronic systolic (congestive) heart failure: Secondary | ICD-10-CM | POA: Diagnosis not present

## 2017-02-17 DIAGNOSIS — M069 Rheumatoid arthritis, unspecified: Secondary | ICD-10-CM | POA: Diagnosis not present

## 2017-02-17 DIAGNOSIS — I2510 Atherosclerotic heart disease of native coronary artery without angina pectoris: Secondary | ICD-10-CM | POA: Diagnosis not present

## 2017-02-18 DIAGNOSIS — R0989 Other specified symptoms and signs involving the circulatory and respiratory systems: Secondary | ICD-10-CM | POA: Diagnosis not present

## 2017-02-18 DIAGNOSIS — R5381 Other malaise: Secondary | ICD-10-CM | POA: Diagnosis not present

## 2017-02-18 DIAGNOSIS — D638 Anemia in other chronic diseases classified elsewhere: Secondary | ICD-10-CM | POA: Diagnosis not present

## 2017-02-18 DIAGNOSIS — R700 Elevated erythrocyte sedimentation rate: Secondary | ICD-10-CM | POA: Diagnosis not present

## 2017-02-18 DIAGNOSIS — G44321 Chronic post-traumatic headache, intractable: Secondary | ICD-10-CM | POA: Diagnosis not present

## 2017-02-18 DIAGNOSIS — R531 Weakness: Secondary | ICD-10-CM | POA: Diagnosis not present

## 2017-02-18 DIAGNOSIS — R5383 Other fatigue: Secondary | ICD-10-CM | POA: Diagnosis not present

## 2017-02-18 DIAGNOSIS — M069 Rheumatoid arthritis, unspecified: Secondary | ICD-10-CM | POA: Diagnosis not present

## 2017-02-18 DIAGNOSIS — R1013 Epigastric pain: Secondary | ICD-10-CM | POA: Diagnosis not present

## 2017-02-19 ENCOUNTER — Other Ambulatory Visit: Payer: Self-pay

## 2017-02-19 ENCOUNTER — Other Ambulatory Visit: Payer: Self-pay | Admitting: Internal Medicine

## 2017-02-19 ENCOUNTER — Inpatient Hospital Stay
Admission: EM | Admit: 2017-02-19 | Discharge: 2017-02-22 | DRG: 640 | Disposition: A | Discharge status: Home Health | Payer: Medicare Other | Attending: Internal Medicine | Admitting: Internal Medicine

## 2017-02-19 DIAGNOSIS — I214 Non-ST elevation (NSTEMI) myocardial infarction: Secondary | ICD-10-CM | POA: Diagnosis not present

## 2017-02-19 DIAGNOSIS — I255 Ischemic cardiomyopathy: Secondary | ICD-10-CM | POA: Diagnosis not present

## 2017-02-19 DIAGNOSIS — E871 Hypo-osmolality and hyponatremia: Principal | ICD-10-CM

## 2017-02-19 DIAGNOSIS — E039 Hypothyroidism, unspecified: Secondary | ICD-10-CM | POA: Diagnosis present

## 2017-02-19 DIAGNOSIS — J302 Other seasonal allergic rhinitis: Secondary | ICD-10-CM | POA: Diagnosis present

## 2017-02-19 DIAGNOSIS — N184 Chronic kidney disease, stage 4 (severe): Secondary | ICD-10-CM | POA: Diagnosis present

## 2017-02-19 DIAGNOSIS — I25798 Atherosclerosis of other coronary artery bypass graft(s) with other forms of angina pectoris: Secondary | ICD-10-CM | POA: Diagnosis not present

## 2017-02-19 DIAGNOSIS — R700 Elevated erythrocyte sedimentation rate: Secondary | ICD-10-CM

## 2017-02-19 DIAGNOSIS — Z8679 Personal history of other diseases of the circulatory system: Secondary | ICD-10-CM | POA: Diagnosis not present

## 2017-02-19 DIAGNOSIS — I472 Ventricular tachycardia: Secondary | ICD-10-CM | POA: Diagnosis present

## 2017-02-19 DIAGNOSIS — I5023 Acute on chronic systolic (congestive) heart failure: Secondary | ICD-10-CM | POA: Diagnosis present

## 2017-02-19 DIAGNOSIS — R5381 Other malaise: Secondary | ICD-10-CM | POA: Diagnosis not present

## 2017-02-19 DIAGNOSIS — E7800 Pure hypercholesterolemia, unspecified: Secondary | ICD-10-CM | POA: Diagnosis present

## 2017-02-19 DIAGNOSIS — Z7982 Long term (current) use of aspirin: Secondary | ICD-10-CM | POA: Diagnosis not present

## 2017-02-19 DIAGNOSIS — I252 Old myocardial infarction: Secondary | ICD-10-CM | POA: Diagnosis not present

## 2017-02-19 DIAGNOSIS — E875 Hyperkalemia: Secondary | ICD-10-CM | POA: Diagnosis present

## 2017-02-19 DIAGNOSIS — M069 Rheumatoid arthritis, unspecified: Secondary | ICD-10-CM | POA: Diagnosis present

## 2017-02-19 DIAGNOSIS — I10 Essential (primary) hypertension: Secondary | ICD-10-CM | POA: Diagnosis not present

## 2017-02-19 DIAGNOSIS — E785 Hyperlipidemia, unspecified: Secondary | ICD-10-CM | POA: Diagnosis present

## 2017-02-19 DIAGNOSIS — N179 Acute kidney failure, unspecified: Secondary | ICD-10-CM

## 2017-02-19 DIAGNOSIS — I509 Heart failure, unspecified: Secondary | ICD-10-CM | POA: Diagnosis not present

## 2017-02-19 DIAGNOSIS — N289 Disorder of kidney and ureter, unspecified: Secondary | ICD-10-CM

## 2017-02-19 DIAGNOSIS — Z95810 Presence of automatic (implantable) cardiac defibrillator: Secondary | ICD-10-CM | POA: Diagnosis not present

## 2017-02-19 DIAGNOSIS — B370 Candidal stomatitis: Secondary | ICD-10-CM | POA: Diagnosis present

## 2017-02-19 DIAGNOSIS — N183 Chronic kidney disease, stage 3 (moderate): Secondary | ICD-10-CM | POA: Diagnosis not present

## 2017-02-19 DIAGNOSIS — M316 Other giant cell arteritis: Secondary | ICD-10-CM | POA: Diagnosis present

## 2017-02-19 DIAGNOSIS — H9190 Unspecified hearing loss, unspecified ear: Secondary | ICD-10-CM | POA: Diagnosis present

## 2017-02-19 DIAGNOSIS — R0600 Dyspnea, unspecified: Secondary | ICD-10-CM

## 2017-02-19 DIAGNOSIS — R7989 Other specified abnormal findings of blood chemistry: Secondary | ICD-10-CM | POA: Diagnosis not present

## 2017-02-19 DIAGNOSIS — I2510 Atherosclerotic heart disease of native coronary artery without angina pectoris: Secondary | ICD-10-CM | POA: Diagnosis present

## 2017-02-19 DIAGNOSIS — Z888 Allergy status to other drugs, medicaments and biological substances status: Secondary | ICD-10-CM

## 2017-02-19 DIAGNOSIS — R531 Weakness: Secondary | ICD-10-CM | POA: Diagnosis not present

## 2017-02-19 DIAGNOSIS — Z79899 Other long term (current) drug therapy: Secondary | ICD-10-CM | POA: Diagnosis not present

## 2017-02-19 DIAGNOSIS — Z7952 Long term (current) use of systemic steroids: Secondary | ICD-10-CM | POA: Diagnosis not present

## 2017-02-19 DIAGNOSIS — R5383 Other fatigue: Secondary | ICD-10-CM | POA: Diagnosis not present

## 2017-02-19 DIAGNOSIS — Z66 Do not resuscitate: Secondary | ICD-10-CM | POA: Diagnosis present

## 2017-02-19 DIAGNOSIS — R0602 Shortness of breath: Secondary | ICD-10-CM | POA: Diagnosis not present

## 2017-02-19 DIAGNOSIS — R1013 Epigastric pain: Secondary | ICD-10-CM

## 2017-02-19 DIAGNOSIS — D638 Anemia in other chronic diseases classified elsewhere: Secondary | ICD-10-CM

## 2017-02-19 LAB — URINALYSIS, COMPLETE (UACMP) WITH MICROSCOPIC
Glucose, UA: NEGATIVE mg/dL
Hgb urine dipstick: NEGATIVE
Ketones, ur: NEGATIVE mg/dL
LEUKOCYTES UA: NEGATIVE
Nitrite: NEGATIVE
PH: 5 (ref 5.0–8.0)
Protein, ur: 30 mg/dL — AB
RBC / HPF: NONE SEEN RBC/hpf (ref 0–5)

## 2017-02-19 LAB — CBC
HEMATOCRIT: 34.4 % — AB (ref 40.0–52.0)
Hemoglobin: 11.1 g/dL — ABNORMAL LOW (ref 13.0–18.0)
MCH: 31.8 pg (ref 26.0–34.0)
MCHC: 32.3 g/dL (ref 32.0–36.0)
MCV: 98.4 fL (ref 80.0–100.0)
PLATELETS: 226 10*3/uL (ref 150–440)
RBC: 3.5 MIL/uL — ABNORMAL LOW (ref 4.40–5.90)
RDW: 15.6 % — AB (ref 11.5–14.5)
WBC: 11.2 10*3/uL — ABNORMAL HIGH (ref 3.8–10.6)

## 2017-02-19 LAB — BASIC METABOLIC PANEL
Anion gap: 16 — ABNORMAL HIGH (ref 5–15)
BUN: 45 mg/dL — AB (ref 6–20)
CHLORIDE: 93 mmol/L — AB (ref 101–111)
CO2: 17 mmol/L — AB (ref 22–32)
CREATININE: 1.92 mg/dL — AB (ref 0.61–1.24)
Calcium: 8.7 mg/dL — ABNORMAL LOW (ref 8.9–10.3)
GFR calc Af Amer: 37 mL/min — ABNORMAL LOW (ref >60)
GFR calc non Af Amer: 32 mL/min — ABNORMAL LOW (ref >60)
GLUCOSE: 166 mg/dL — AB (ref 65–99)
POTASSIUM: 5 mmol/L (ref 3.5–5.1)
SODIUM: 126 mmol/L — AB (ref 135–145)

## 2017-02-19 LAB — TROPONIN I: TROPONIN I: 0.03 ng/mL — AB (ref <0.03)

## 2017-02-19 MED ORDER — SODIUM CHLORIDE 0.9 % IV BOLUS (SEPSIS): 1000.0000 mL | Freq: Once | INTRAVENOUS | Status: DC

## 2017-02-19 MED ORDER — SODIUM CHLORIDE 0.9 % IV BOLUS (SEPSIS)
1000.0000 mL | Freq: Once | INTRAVENOUS | Status: AC
  Administered 2017-02-19: 1000 mL via INTRAVENOUS

## 2017-02-19 MED ORDER — ACETAMINOPHEN 500 MG PO TABS
ORAL_TABLET | ORAL | Status: AC
  Administered 2017-02-19: 1000 mg via ORAL
  Filled 2017-02-19: qty 2

## 2017-02-19 MED ORDER — ACETAMINOPHEN 500 MG PO TABS
1000.0000 mg | ORAL_TABLET | Freq: Once | ORAL | Status: AC
  Administered 2017-02-19: 1000 mg via ORAL

## 2017-02-19 NOTE — H&P (Signed)
[facility] NAME: [name]    MR#:  [id number]  DATE OF BIRTH:  12-Sep-1940  DATE OF ADMISSION:  02/19/2017  PRIMARY CARE PHYSICIAN: Cyndi Bender, PA-C   REQUESTING/REFERRING PHYSICIAN:   CHIEF COMPLAINT:   Chief Complaint  Patient presents with  . Weakness    HISTORY OF PRESENT ILLNESS: [name]  is a 77 y.o. male with a known history of CHF, rheumatoid arthritis, coronary artery disease, VT, S/P AICD placement.  Patient was also recently started on prednisone for possible temporal arteritis, due to headache and elevated ESR.  He was just notified by rheumatology that temporal artery biopsy was negative for temporal arteritis.  However, he was advised to continue prednisone treatment.  He presented to emergency room this time for generalized weakness, SOB and nausea going on for the past few days.  He had blood test done as outpatient and he was recommended by the physician to present to emergency room for abnormal blood test results.  Patient denied any fever or chills, no vomiting/diarrhea.  He does admit to some intermittent mild abdominal pain, going on for the past week.  No chest pain/cough, bleeding. Blood test done in the emergency room, remarkable for elevated creatinine level of 1.92 and hyponatremia at 126. Patient is admitted for further evaluation and treatment  PAST MEDICAL HISTORY:   Past Medical History:  Diagnosis Date  . AICD (automatic cardioverter/defibrillator) present    [employer]  . Arthritis    RA  . Balance problem   . CHF (congestive heart failure) ([place])   . Collagen vascular disease ([place])    RA since 2014  . Coronary artery disease   . Deviated nasal septum   . Dysrhythmia   . Hearing loss    TINNITUS  . High cholesterol   . Leg weakness   . Myocardial infarction ([place])   . Presence of permanent cardiac pacemaker    WITH DEFIBRILATOR ([employer])  . Seasonal  allergies    RHINITIS  . Shortness of breath dyspnea    COUGH  . Sinus headache   . Sinusitis    CHRONIC  . Sleep apnea   . Vertigo    DYSEQUILIBRIUM    PAST SURGICAL HISTORY:  Past Surgical History:  Procedure Laterality Date  . CARDIAC SURGERY    . CATARACT EXTRACTION     BILATERAL  . CORONARY ANGIOPLASTY     STENTS  . CORONARY ARTERY BYPASS GRAFT  1992  . heart pump    . INSERT / REPLACE / REMOVE PACEMAKER    . SEPTOPLASTY      SOCIAL HISTORY:  Social History   Tobacco Use  . Smoking status: Never Smoker  . Smokeless tobacco: Never Used  Substance Use Topics  . Alcohol use: No    Alcohol/week: 0.0 oz    FAMILY HISTORY:  Family History  Problem Relation Age of Onset  . Alzheimer's disease Mother     DRUG ALLERGIES:  Allergies  Allergen Reactions  . Pravastatin Other (See Comments)    Reaction:  Joint pain     REVIEW OF SYSTEMS:   CONSTITUTIONAL: No fever, but patient complains of extreme fatigue and generalized weakness.  EYES: No vision changes.  EARS, NOSE, AND THROAT: No ear pain.  RESPIRATORY: Positive for shortness of breath with exertion; no cough, wheezing or hemoptysis.  CARDIOVASCULAR: No chest pain, orthopnea; positive for chronic lower extremities edema.  GASTROINTESTINAL: Positive for nausea  and intermittent abdominal pain; no vomiting, diarrhea.  GENITOURINARY: No dysuria, hematuria.  ENDOCRINE: No polyuria, nocturia,  HEMATOLOGY: No bleeding. SKIN: No rash or lesion. MUSCULOSKELETAL: Positive history of osteoarthritis.   NEUROLOGIC: No focal weakness.  Positive history of chronic headaches. PSYCHIATRY: No anxiety or depression.   MEDICATIONS AT HOME:  Prior to Admission medications   Medication Sig Start Date End Date Taking? Authorizing Provider  acetaminophen (TYLENOL) 325 MG tablet Take 2 tablets (650 mg total) by mouth every 6 (six) hours as needed for mild pain (or Fever >/= 101). 07/18/14  Yes Gouru, Aruna, MD  amiodarone  (PACERONE) 400 MG tablet Take 300 mg by mouth daily.    Yes [provider]  aspirin EC 81 MG tablet Take 81 mg by mouth daily.   Yes [provider]  cetirizine (ZYRTEC) 10 MG tablet Take 10 mg by mouth daily.   Yes [provider]  Cholecalciferol (VITAMIN D3) 2000 UNITS TABS Take 2,000 Units by mouth daily.    Yes [provider]  docusate sodium (COLACE) 100 MG capsule Take 100 mg by mouth every evening.   Yes [provider]  furosemide (LASIX) 40 MG tablet Take 40 mg by mouth daily as needed for edema.   Yes [provider]  leflunomide (ARAVA) 20 MG tablet Take 20 mg by mouth every evening.   Yes [provider]  levothyroxine (SYNTHROID, LEVOTHROID) 50 MCG tablet Take 1 tablet by mouth every morning. 02/13/17  Yes [provider]  metoprolol succinate (TOPROL-XL) 50 MG 24 hr tablet Take 1 tablet (50 mg total) by mouth 2 (two) times daily. Patient taking differently: Take 25 mg by mouth daily.  12/02/16  Yes Fritzi Mandes, MD  mexiletine (MEXITIL) 150 MG capsule Take 150 mg by mouth 3 (three) times daily.    Yes [provider]  nitroGLYCERIN (NITROSTAT) 0.4 MG SL tablet Place 0.4 mg under the tongue every 5 (five) minutes as needed for chest pain.   Yes [provider]  predniSONE (DELTASONE) 20 MG tablet Take 60 mg by mouth daily with breakfast.  02/16/17  Yes [provider]  traMADol (ULTRAM) 50 MG tablet Take 1 tablet by mouth 3 (three) times daily as needed. 02/18/17  Yes [provider]  vitamin B-12 (CYANOCOBALAMIN) 1000 MCG tablet Take 1,000 mcg by mouth daily.   Yes [provider]  cefUROXime (CEFTIN) 500 MG tablet Take 1 tablet by mouth 2 (two) times daily. 02/05/17   [provider]  fluticasone (FLONASE) 50 MCG/ACT nasal spray Place 1 spray into both nostrils daily as needed for rhinitis.     [provider]  spironolactone (ALDACTONE) 25 MG tablet Take  25 mg by mouth daily.    [provider]      PHYSICAL EXAMINATION:   VITAL SIGNS: Blood pressure 103/69, pulse (!) 54, resp. rate 19, height 5' 8" (1.727 m), weight 83 kg (183 lb), SpO2 95 %.  GENERAL:  77 y.o.-year-old patient lying in the bed with no acute distress.  EYES: Pupils equal, round, reactive to light and accommodation. No scleral icterus. Extraocular muscles intact.  HEENT: Head atraumatic, normocephalic. Oropharynx and nasopharynx clear.  NECK:  Supple, no jugular venous distention. No thyroid enlargement, no tenderness.  LUNGS: Reduced breath sounds bilaterally, no wheezing, rales,rhonchi or crepitation. No use of accessory muscles of respiration.  CARDIOVASCULAR: S1, S2 normal. No S3/S4.  ABDOMEN: Soft, nontender, nondistended. Bowel sounds present. No organomegaly or mass.  EXTREMITIES: 1+ bilateral  pedal edema is noted; no erythema or tenderness in the lower extremities.  NEUROLOGIC: No focal weakness. gait not checked.  PSYCHIATRIC: The patient is alert and oriented x 3.  SKIN: No obvious rash, lesion, or ulcer.   LABORATORY PANEL:   CBC Recent Labs  Lab 02/19/17 1758  WBC 11.2*  HGB 11.1*  HCT 34.4*  PLT 226  MCV 98.4  MCH 31.8  MCHC 32.3  RDW 15.6*   ------------------------------------------------------------------------------------------------------------------  Chemistries  Recent Labs  Lab 02/19/17 1758  NA 126*  K 5.0  CL 93*  CO2 17*  GLUCOSE 166*  BUN 45*  CREATININE 1.92*  CALCIUM 8.7*   ------------------------------------------------------------------------------------------------------------------ estimated creatinine clearance is 33.8 mL/min (A) (by C-G formula based on SCr of 1.92 mg/dL (H)). ------------------------------------------------------------------------------------------------------------------ No results for input(s): TSH, T4TOTAL, T3FREE, THYROIDAB in the last 72 hours.  Invalid input(s):  FREET3   Coagulation profile No results for input(s): INR, PROTIME in the last 168 hours. ------------------------------------------------------------------------------------------------------------------- No results for input(s): DDIMER in the last 72 hours. -------------------------------------------------------------------------------------------------------------------  Cardiac Enzymes Recent Labs  Lab 02/19/17 1758  TROPONINI 0.03*   ------------------------------------------------------------------------------------------------------------------ Invalid input(s): POCBNP  ---------------------------------------------------------------------------------------------------------------  Urinalysis    Component Value Date/Time   COLORURINE YELLOW 02/19/2017 2131   APPEARANCEUR CLEAR 02/19/2017 2131   APPEARANCEUR Clear 09/28/2013 0807   LABSPEC >1.030 (H) 02/19/2017 2131   LABSPEC 1.015 09/28/2013 0807   PHURINE 5.0 02/19/2017 2131   GLUCOSEU NEGATIVE 02/19/2017 2131   GLUCOSEU Negative 09/28/2013 0807   HGBUR NEGATIVE 02/19/2017 2131   BILIRUBINUR SMALL (A) 02/19/2017 2131   BILIRUBINUR Negative 09/28/2013 0807   KETONESUR NEGATIVE 02/19/2017 2131   PROTEINUR 30 (A) 02/19/2017 2131   NITRITE NEGATIVE 02/19/2017 2131   LEUKOCYTESUR NEGATIVE 02/19/2017 2131   LEUKOCYTESUR Negative 09/28/2013 0807     RADIOLOGY: No results found.  EKG: Orders placed or performed during the hospital encounter of 02/19/17  . ED EKG  . ED EKG  . EKG 12-Lead  . EKG 12-Lead    IMPRESSION AND PLAN:  1.  Acute hyponatremia, of unclear etiology.  Patient looks clinically dry.  We will start gentle hydration with normal saline and monitor sodium level closely.  Will check urine sodium and osmolality for further evaluation.  We will also check a chest x-ray and BNP.  Hyponatremia could be caused by medication side effects, like amiodarone use. 2.  Acute renal failure, likely prerenal.  We  will start gentle IV hydration with normal saline and monitor kidney function closely.  Avoid nephrotoxic medications. 3.  Elevated ESR, in 90s, per records.  Patient has been following as outpatient with rheumatology.  He was recently started on prednisone, 60 mg a day, for possible temporal arteritis.  Temporal artery biopsy was reported negative just recently, a few days ago.  We will likely attempt to taper off prednisone. 4.  CHF, clinically compensated, continue medical treatment. 5.  VT, status post recent ICD placement, currently well controlled,  on high-dose amiodarone and mexiletine, started 2 months ago. 6.  Nausea and generalized weakness, likely multifactorial, from medication side effects (amiodarone?) worsened by acute renal failure.  Continue supportive measures, will use Zofran as needed.  Will discuss with cardiology about possible medication dose adjustment.  All the records are reviewed and case discussed with ED provider. Management plans discussed with the patient, family and they are in agreement.  CODE STATUS: Code Status History    Date Active Date Inactive Code Status Order ID Comments User Context   12/02/2016 02:25  12/02/2016 18:36 DNR [id number]  Saundra Shelling, MD ED   08/18/2014 22:15 08/20/2014 14:38 DNR [id number]  Loletha Grayer, MD ED   07/15/2014 03:26 07/18/2014 17:28 Full Code [id number]  [name], MD ED    Questions for Most Recent Historical Code Status (Order [id number])    Question Answer Comment   In the event of cardiac or respiratory ARREST Do not call a "code blue"    In the event of cardiac or respiratory ARREST Do not perform Intubation, CPR, defibrillation or ACLS    In the event of cardiac or respiratory ARREST Use medication by any route, position, wound care, and other measures to relive pain and suffering. May use oxygen, suction and manual treatment of airway obstruction as needed for comfort.    Comments Nurse may pronounce          Advance Directive Documentation     Most Recent Value  Type of Advance Directive  Living will  Pre-existing out of facility DNR order (yellow form or pink MOST form)  No data  "MOST" Form in Place?  No data       TOTAL TIME TAKING CARE OF THIS PATIENT: 45 minutes.    Amelia Jo M.D on 02/19/2017 at 11:41 PM  Between 7am to 6pm - Pager - [phone]  After 6pm go to [email or web address] - password [email or web address] [facility]  Office  [phone]  CC: Primary care physician; Cyndi Bender, PA-C

## 2017-02-19 NOTE — ED Provider Notes (Signed)
[facility] Emergency Department Provider Note  Time seen: 9:46 PM  I have reviewed the triage vital signs and the nursing notes.   HISTORY  Chief Complaint Weakness    HPI [name] is a 77 y.o. male with a past medical history of AICD, arthritis, past MI, hyperlipidemia, presents to the emergency department for generalized fatigue.  According to the patient for the past 1-2 weeks he has been feeling extremely weak, short of breath at times especially with exertion, states nausea with dry heaving but denies any vomiting.  Denies any abdominal pain or chest pain.  Denies any cough congestion or fever.  Denies dysuria or difficulty urinating.  Patient went to his doctor yesterday as well as today, was called back this evening telling him to go to the emergency department due to abnormal labs and possible dehydration.  Past Medical History:  Diagnosis Date  . AICD (automatic cardioverter/defibrillator) present    [employer]  . Arthritis    RA  . Balance problem   . CHF (congestive heart failure) ([place])   . Collagen vascular disease ([place])    RA since 2014  . Coronary artery disease   . Deviated nasal septum   . Dysrhythmia   . Hearing loss    TINNITUS  . High cholesterol   . Leg weakness   . Myocardial infarction ([place])   . Presence of permanent cardiac pacemaker    WITH DEFIBRILATOR ([employer])  . Seasonal allergies    RHINITIS  . Shortness of breath dyspnea    COUGH  . Sinus headache   . Sinusitis    CHRONIC  . Sleep apnea   . Vertigo    DYSEQUILIBRIUM    Patient Active Problem List   Diagnosis Date Noted  . Non-STEMI (non-ST elevated myocardial infarction) ([place]) 12/02/2016  . V-tach ([place]) 08/20/2014  . Hyperthyroidism 08/20/2014  . Carotid stenosis 08/20/2014  . Dyspepsia 08/20/2014  . Malnutrition of moderate degree ([place]) 08/19/2014  . Syncope and collapse 08/18/2014  . Chest pain 07/15/2014  . CAD (coronary artery  disease) 07/15/2014  . Fall at home 07/15/2014  . Nausea and vomiting 07/15/2014  . Nausea & vomiting 07/15/2014  . Abnormality of gait 12/13/2013  . Low back pain 12/13/2013  . Arthritis   . Sinus headache   . Injury of kidney 12/02/2013  . Arteriosclerosis of coronary artery 12/02/2013  . Body water dehydration 12/02/2013  . Sinus infection 12/02/2013  . Idiopathic ventricular tachycardia ([place]) 09/14/2012  . Heart failure, systolic ([place]) 24/40/1027  . Automatic implantable cardioverter-defibrillator in situ 07/26/2012  . Acute MI, subendocardial ([place]) 07/12/2012  . Essential (primary) hypertension 07/12/2012  . HLD (hyperlipidemia) 07/12/2012  . BP (high blood pressure) 07/12/2012  . Acute non-ST segment elevation myocardial infarction ([place]) 07/12/2012  . Arthritis or polyarthritis, rheumatoid ([place]) 07/12/2012    Past Surgical History:  Procedure Laterality Date  . CARDIAC SURGERY    . CATARACT EXTRACTION     BILATERAL  . CORONARY ANGIOPLASTY     STENTS  . CORONARY ARTERY BYPASS GRAFT  1992  . heart pump    . INSERT / REPLACE / REMOVE PACEMAKER    . SEPTOPLASTY      Prior to Admission medications   Medication Sig Start Date End Date Taking? Authorizing Provider  acetaminophen (TYLENOL) 325 MG tablet Take 2 tablets (650 mg total) by mouth every 6 (six) hours as needed for mild pain (or Fever >/= 101). 07/18/14   Gouru,  Illene Silver, MD  amiodarone (PACERONE) 400 MG tablet Take 400 mg by mouth daily.    [provider]  aspirin EC 81 MG tablet Take 81 mg by mouth daily.    [provider]  cetirizine (ZYRTEC) 10 MG tablet Take 10 mg by mouth daily.    [provider]  Cholecalciferol (VITAMIN D3) 2000 UNITS TABS Take 2,000 Units by mouth daily.     [provider]  docusate sodium (COLACE) 100 MG capsule Take 100 mg by mouth every evening.    [provider]  fluticasone (FLONASE) 50 MCG/ACT nasal spray Place 1 spray into both nostrils  daily as needed for rhinitis.     [provider]  furosemide (LASIX) 40 MG tablet Take 40 mg by mouth daily as needed for edema.    [provider]  metoprolol succinate (TOPROL-XL) 50 MG 24 hr tablet Take 1 tablet (50 mg total) by mouth 2 (two) times daily. 12/02/16   Fritzi Mandes, MD  mexiletine (MEXITIL) 150 MG capsule Take 150 mg by mouth 2 (two) times daily.    [provider]  nitroGLYCERIN (NITROSTAT) 0.4 MG SL tablet Place 0.4 mg under the tongue every 5 (five) minutes as needed for chest pain.    [provider]  spironolactone (ALDACTONE) 25 MG tablet Take 25 mg by mouth daily.    [provider]    Allergies  Allergen Reactions  . Pravastatin Other (See Comments)    Reaction:  Joint pain     Family History  Problem Relation Age of Onset  . Alzheimer's disease Mother     Social History Social History   Tobacco Use  . Smoking status: Never Smoker  . Smokeless tobacco: Never Used  Substance Use Topics  . Alcohol use: No    Alcohol/week: 0.0 oz  . Drug use: No    Review of Systems Constitutional: Negative for fever.  Positive for generalized fatigue and weakness worse times 1 week. Eyes: Negative for visual complaints ENT: Negative for recent illness/congestion Cardiovascular: Negative for chest pain. Respiratory: Mild shortness of breath with exertion. Gastrointestinal: Negative for abdominal pain.  Negative for vomiting or diarrhea but states nausea with occasional dry heaves. Genitourinary: Negative for urinary compaints Musculoskeletal: Patient states mild swelling to right lower extremity which is been present since a hospital admission in December with several small areas of cellulitis status post antibiotics. Skin: Patient has several small areas of scarring to the right lower extremity was told in the past that there was infectious but was treated with antibiotics and is improving. Neurological: Moderate  headache All other ROS negative  ____________________________________________   PHYSICAL EXAM:  VITAL SIGNS: ED Triage Vitals  Enc Vitals Group     BP 02/19/17 1750 107/62     Pulse Rate 02/19/17 1750 (!) 59     Resp 02/19/17 1750 20     Temp --      Temp src --      SpO2 02/19/17 1750 96 %     Weight 02/19/17 1750 183 lb (83 kg)     Height 02/19/17 1750 5\' 8"  (1.727 m)     Head Circumference --      Peak Flow --      Pain Score 02/19/17 1756 9     Pain Loc --      Pain Edu? --      Excl. in [place]? --     Constitutional: Alert. Well appearing and in no distress.  Eyes: Normal exam ENT   Head: Normocephalic and atraumatic.   Mouth/Throat: Mucous membranes are moist. Cardiovascular: Normal rate, regular rhythm.  Respiratory: Normal respiratory effort without tachypnea nor retractions. Breath sounds are clear  Gastrointestinal: Soft and nontender. No distention.   Musculoskeletal: Nontender with normal range of motion in all extremities.  Neurologic:  Normal speech and language. No gross focal neurologic deficits Skin:  Skin is warm, dry and intact.  Psychiatric: Mood and affect are normal. Speech and behavior are normal.   ____________________________________________    EKG  EKG reviewed and interpreted by myself shows sinus bradycardia 59 bpm with a widened QRS, left axis deviation, prolonged PR interval as well as QTC, nonspecific ST changes.  Morphology largely unchanged from prior EKG 12/07/14  ____________________________________________   INITIAL IMPRESSION / ASSESSMENT AND PLAN / ED COURSE  Pertinent labs & imaging results that were available during my care of the patient were reviewed by me and considered in my medical decision making (see chart for details).  Patient presents emergency department for generalized weakness, worsening over the past 1 week.  Differential is quite broad but would include infectious etiology, metabolic/left light abnormality,  dehydration.  Overall patient appears well, no acute distress.  Patient's labs show hyponatremia at 126 which appears to be new for the patient.  Creatinine has increased to 1.9 from a baseline of 1.1 which again appears to be new for the patient.  We will check a bladder scan to rule out urinary retention and continue to closely monitor the patient in the emergency department.  We will start on IV fluids given the patient's progressive weakness over the past 1 week with hyponatremia and acute renal insufficiency anticipate likely admission to the hospital for continued treatment.  I discussed this with the patient who is agreeable.  Patient's urinalysis is negative.  Discussed patient with the hospitalist patient will be admitted for renal insufficiency and hyponatremia.  Patient agreeable to this plan of care.  ____________________________________________   FINAL CLINICAL IMPRESSION(S) / ED DIAGNOSES  Hyponatremia Acute renal insufficiency    Harvest Dark, MD 02/19/17 2259

## 2017-02-19 NOTE — ED Triage Notes (Signed)
Pt c/o pain all over and generalized weakness for the last week - denies chest pain, shortness of breath, or dizziness - pt was at PCP yesterday and they called today and stated that the pt was dehydrated and needed to be seen in the ER

## 2017-02-19 NOTE — ED Notes (Signed)
First nurse note  Presents with weakness and possible dehydration  Sent in by PCP

## 2017-02-20 ENCOUNTER — Other Ambulatory Visit: Payer: Self-pay

## 2017-02-20 ENCOUNTER — Ambulatory Visit: Admission: RE | Admit: 2017-02-20 | Payer: Medicare Other | Source: Ambulatory Visit

## 2017-02-20 ENCOUNTER — Inpatient Hospital Stay: Payer: Medicare Other

## 2017-02-20 LAB — CBC
HEMATOCRIT: 33.3 % — AB (ref 40.0–52.0)
HEMOGLOBIN: 10.8 g/dL — AB (ref 13.0–18.0)
MCH: 31.6 pg (ref 26.0–34.0)
MCHC: 32.5 g/dL (ref 32.0–36.0)
MCV: 97.3 fL (ref 80.0–100.0)
Platelets: 183 10*3/uL (ref 150–440)
RBC: 3.43 MIL/uL — ABNORMAL LOW (ref 4.40–5.90)
RDW: 15.6 % — ABNORMAL HIGH (ref 11.5–14.5)
WBC: 11.9 10*3/uL — AB (ref 3.8–10.6)

## 2017-02-20 LAB — BASIC METABOLIC PANEL
ANION GAP: 17 — AB (ref 5–15)
BUN: 49 mg/dL — ABNORMAL HIGH (ref 6–20)
CHLORIDE: 95 mmol/L — AB (ref 101–111)
CO2: 12 mmol/L — ABNORMAL LOW (ref 22–32)
Calcium: 8.1 mg/dL — ABNORMAL LOW (ref 8.9–10.3)
Creatinine, Ser: 2.1 mg/dL — ABNORMAL HIGH (ref 0.61–1.24)
GFR calc non Af Amer: 29 mL/min — ABNORMAL LOW (ref >60)
GFR, EST AFRICAN AMERICAN: 33 mL/min — AB (ref >60)
Glucose, Bld: 106 mg/dL — ABNORMAL HIGH (ref 65–99)
POTASSIUM: 5.2 mmol/L — AB (ref 3.5–5.1)
Sodium: 124 mmol/L — ABNORMAL LOW (ref 135–145)

## 2017-02-20 LAB — GLUCOSE, CAPILLARY: GLUCOSE-CAPILLARY: 94 mg/dL (ref 65–99)

## 2017-02-20 LAB — BRAIN NATRIURETIC PEPTIDE: B NATRIURETIC PEPTIDE 5: 1621 pg/mL — AB (ref 0.0–100.0)

## 2017-02-20 MED ORDER — METOPROLOL SUCCINATE ER 50 MG PO TB24: 25.0000 mg | ORAL_TABLET | Freq: Every day | ORAL | Status: DC

## 2017-02-20 MED ORDER — HEPARIN SODIUM (PORCINE) 5000 UNIT/ML IJ SOLN
5000.0000 [IU] | Freq: Three times a day (TID) | INTRAMUSCULAR | Status: DC
  Administered 2017-02-20 – 2017-02-22 (×6): 5000 [IU] via SUBCUTANEOUS
  Filled 2017-02-20 (×6): qty 1

## 2017-02-20 MED ORDER — MEXILETINE HCL 150 MG PO CAPS
150.0000 mg | ORAL_CAPSULE | Freq: Three times a day (TID) | ORAL | Status: DC
  Administered 2017-02-20 – 2017-02-22 (×7): 150 mg via ORAL
  Filled 2017-02-20 (×9): qty 1

## 2017-02-20 MED ORDER — VITAMIN D 1000 UNITS PO TABS
2000.0000 [IU] | ORAL_TABLET | Freq: Every day | ORAL | Status: DC
  Administered 2017-02-20 – 2017-02-22 (×3): 2000 [IU] via ORAL
  Filled 2017-02-20 (×3): qty 2

## 2017-02-20 MED ORDER — ACETAMINOPHEN 325 MG PO TABS
650.0000 mg | ORAL_TABLET | Freq: Four times a day (QID) | ORAL | Status: DC | PRN
  Administered 2017-02-20 – 2017-02-21 (×2): 650 mg via ORAL
  Filled 2017-02-20 (×2): qty 2

## 2017-02-20 MED ORDER — DOCUSATE SODIUM 100 MG PO CAPS
100.0000 mg | ORAL_CAPSULE | Freq: Two times a day (BID) | ORAL | Status: DC
  Administered 2017-02-20 – 2017-02-22 (×5): 100 mg via ORAL
  Filled 2017-02-20 (×6): qty 1

## 2017-02-20 MED ORDER — VITAMIN B-12 1000 MCG PO TABS
1000.0000 ug | ORAL_TABLET | Freq: Every day | ORAL | Status: DC
  Administered 2017-02-20 – 2017-02-22 (×3): 1000 ug via ORAL
  Filled 2017-02-20 (×3): qty 1

## 2017-02-20 MED ORDER — HYDROCODONE-ACETAMINOPHEN 5-325 MG PO TABS: 1.0000 | ORAL_TABLET | ORAL | Status: DC | PRN

## 2017-02-20 MED ORDER — BISACODYL 5 MG PO TBEC: 5.0000 mg | DELAYED_RELEASE_TABLET | Freq: Every day | ORAL | Status: DC | PRN

## 2017-02-20 MED ORDER — SODIUM CHLORIDE 0.9 % IV SOLN
INTRAVENOUS | Status: DC
  Administered 2017-02-20 – 2017-02-22 (×4): via INTRAVENOUS

## 2017-02-20 MED ORDER — TRAZODONE HCL 50 MG PO TABS
25.0000 mg | ORAL_TABLET | Freq: Every evening | ORAL | Status: DC | PRN
Start: 2017-02-20 — End: 2017-02-22

## 2017-02-20 MED ORDER — METOCLOPRAMIDE HCL 5 MG PO TABS: 5.0000 mg | ORAL_TABLET | Freq: Four times a day (QID) | ORAL | Status: DC | PRN

## 2017-02-20 MED ORDER — PREDNISONE 50 MG PO TABS
50.0000 mg | ORAL_TABLET | Freq: Every day | ORAL | Status: DC
  Administered 2017-02-20 – 2017-02-22 (×3): 50 mg via ORAL
  Filled 2017-02-20 (×3): qty 1

## 2017-02-20 MED ORDER — AMIODARONE HCL 100 MG PO TABS
300.0000 mg | ORAL_TABLET | Freq: Every day | ORAL | Status: DC
  Administered 2017-02-20 – 2017-02-22 (×3): 300 mg via ORAL
  Filled 2017-02-20 (×3): qty 1

## 2017-02-20 MED ORDER — ASPIRIN EC 81 MG PO TBEC
81.0000 mg | DELAYED_RELEASE_TABLET | Freq: Every day | ORAL | Status: DC
  Administered 2017-02-20 – 2017-02-22 (×3): 81 mg via ORAL
  Filled 2017-02-20 (×3): qty 1

## 2017-02-20 MED ORDER — NITROGLYCERIN 0.4 MG SL SUBL: 0.4000 mg | SUBLINGUAL_TABLET | SUBLINGUAL | Status: DC | PRN

## 2017-02-20 MED ORDER — ONDANSETRON HCL 4 MG/2ML IJ SOLN: 4.0000 mg | Freq: Four times a day (QID) | INTRAMUSCULAR | Status: DC | PRN

## 2017-02-20 MED ORDER — TRAMADOL HCL 50 MG PO TABS
50.0000 mg | ORAL_TABLET | Freq: Three times a day (TID) | ORAL | Status: DC | PRN
  Administered 2017-02-20 – 2017-02-21 (×3): 50 mg via ORAL
  Filled 2017-02-20 (×3): qty 1

## 2017-02-20 MED ORDER — ACETAMINOPHEN 650 MG RE SUPP
650.0000 mg | Freq: Four times a day (QID) | RECTAL | Status: DC | PRN
  Filled 2017-02-20: qty 1

## 2017-02-20 MED ORDER — METOCLOPRAMIDE HCL 5 MG/ML IJ SOLN: 5.0000 mg | Freq: Four times a day (QID) | INTRAMUSCULAR | Status: DC | PRN

## 2017-02-20 MED ORDER — LEFLUNOMIDE 20 MG PO TABS
20.0000 mg | ORAL_TABLET | Freq: Every evening | ORAL | Status: DC
  Administered 2017-02-20 – 2017-02-21 (×2): 20 mg via ORAL
  Filled 2017-02-20 (×3): qty 1

## 2017-02-20 MED ORDER — FLUTICASONE PROPIONATE 50 MCG/ACT NA SUSP
1.0000 | Freq: Every day | NASAL | Status: DC | PRN
  Filled 2017-02-20: qty 16

## 2017-02-20 MED ORDER — LORATADINE 10 MG PO TABS
10.0000 mg | ORAL_TABLET | Freq: Every day | ORAL | Status: DC
  Administered 2017-02-20 – 2017-02-22 (×3): 10 mg via ORAL
  Filled 2017-02-20 (×3): qty 1

## 2017-02-20 MED ORDER — ONDANSETRON HCL 4 MG PO TABS: 4.0000 mg | ORAL_TABLET | Freq: Four times a day (QID) | ORAL | Status: DC | PRN

## 2017-02-20 MED ORDER — LEVOTHYROXINE SODIUM 50 MCG PO TABS
50.0000 ug | ORAL_TABLET | Freq: Every day | ORAL | Status: DC
  Administered 2017-02-20 – 2017-02-22 (×3): 50 ug via ORAL
  Filled 2017-02-20 (×3): qty 1

## 2017-02-20 NOTE — Plan of Care (Signed)
  Progressing Pain Managment: General experience of comfort will improve 02/20/2017 2125 - Progressing by Jeri Cos, RN

## 2017-02-20 NOTE — Progress Notes (Addendum)
[place] [facility] [place] NAME: [name]    MR#:  [id number]  DATE OF BIRTH:  April 30, 1940  SUBJECTIVE:  CHIEF COMPLAINT:   Chief Complaint  Patient presents with  . Weakness   Generalized weakness. REVIEW OF SYSTEMS:  Review of Systems  Constitutional: Positive for malaise/fatigue. Negative for chills and fever.  HENT: Negative for sore throat.   Eyes: Negative for blurred vision and double vision.  Respiratory: Negative for cough, hemoptysis, shortness of breath, wheezing and stridor.   Cardiovascular: Negative for chest pain, palpitations, orthopnea and leg swelling.  Gastrointestinal: Negative for abdominal pain, blood in stool, diarrhea, melena, nausea and vomiting.  Genitourinary: Negative for dysuria, flank pain and hematuria.  Musculoskeletal: Negative for back pain and joint pain.  Skin: Negative for rash.  Neurological: Positive for weakness. Negative for dizziness, sensory change, focal weakness, seizures, loss of consciousness and headaches.  Endo/Heme/Allergies: Negative for polydipsia.  Psychiatric/Behavioral: Negative for depression. The patient is not nervous/anxious.     DRUG ALLERGIES:   Allergies  Allergen Reactions  . Pravastatin Other (See Comments)    Reaction:  Joint pain    VITALS:  Blood pressure 108/65, pulse (!) 57, temperature 97.9 F (36.6 C), temperature source Oral, resp. rate 16, height 5' 8" (1.727 m), weight 192 lb 3.2 oz (87.2 kg), SpO2 97 %. PHYSICAL EXAMINATION:  Physical Exam  Constitutional: He is oriented to person, place, and time and well-developed, well-nourished, and in no distress.  HENT:  Head: Normocephalic.  Mouth/Throat: Oropharynx is clear and moist.  Eyes: Conjunctivae and EOM are normal. Pupils are equal, round, and reactive to light. No scleral icterus.  Neck: Normal range of motion. Neck supple. No JVD present. No tracheal deviation present.  Cardiovascular: Normal rate,  regular rhythm and normal heart sounds. Exam reveals no gallop.  No murmur heard. Pulmonary/Chest: Effort normal and breath sounds normal. No respiratory distress. He has no wheezes. He has no rales.  Abdominal: Soft. Bowel sounds are normal. He exhibits no distension. There is no tenderness. There is no rebound.  Musculoskeletal: Normal range of motion. He exhibits no edema or tenderness.  Neurological: He is alert and oriented to person, place, and time. No cranial nerve deficit.  Skin: No rash noted. No erythema.  Psychiatric: Affect normal.   LABORATORY PANEL:  Male CBC Recent Labs  Lab 02/20/17 0622  WBC 11.9*  HGB 10.8*  HCT 33.3*  PLT 183   ------------------------------------------------------------------------------------------------------------------ Chemistries  Recent Labs  Lab 02/20/17 0510  NA 124*  K 5.2*  CL 95*  CO2 12*  GLUCOSE 106*  BUN 49*  CREATININE 2.10*  CALCIUM 8.1*   RADIOLOGY:  Dg Chest 2 View  Result Date: 02/20/2017 CLINICAL DATA:  Shortness of breath. EXAM: CHEST  2 VIEW COMPARISON:  02/25/2016. FINDINGS: AICD in stable position. A catheter is noted overlying the right mid chest. This could represent a catheter overlying the chest. Prior CABG. Stable cardiomegaly. Low lung volumes. No focal infiltrate. No pleural effusion or pneumothorax. Old right rib fractures. IMPRESSION: 1. AICD in stable position. Prior CABG. Cardiomegaly. No pulmonary venous congestion. 2.  Low lung volumes. Electronically Signed   By: Marcello Moores  Register   On: 02/20/2017 07:33   ASSESSMENT AND PLAN:   1.  Acute hyponatremia, of unclear etiology.   Continue normal saline and monitor sodium level  2.  Acute renal failure, likely prerenal.   Continue IV hydration with normal saline and monitor kidney function closely.  Avoid nephrotoxic medications.  3.  Elevated ESR, in 90s, per records.  Patient has been following as outpatient with rheumatology.  He was recently started  on prednisone, 60 mg a day, for possible temporal arteritis.  Temporal artery biopsy was reported negative just recently, a few days ago.   taper prednisone.  4.   Chronic systolic CHF ejection fraction 25-30%, clinically compensated,  hold the Lasix and spironolactone.  Follow-up [facility] cardiologist.  5.  VT, status post recent ICD placement, currently well controlled,  on high-dose amiodarone and mexiletine, started 2 months ago.  6.  Nausea and generalized weakness,  Zofran as needed.    PT evaluation.  Mild hyperkalemia.  Continue normal saline IV and follow-up BMP.  All the records are reviewed and case discussed with Care Management/Social Worker. Management plans discussed with the patient, his wife and they are in agreement.  CODE STATUS: DNR  TOTAL TIME TAKING CARE OF THIS PATIENT: 37 minutes.   More than 50% of the time was spent in counseling/coordination of care: YES  POSSIBLE D/C IN 2 DAYS, DEPENDING ON CLINICAL CONDITION.   Demetrios Loll M.D on 02/20/2017 at 3:36 PM  Between 7am to 6pm - Pager - [phone]  After 6pm go to [email or web address] - [facility]

## 2017-02-20 NOTE — ED Notes (Addendum)
Patient transported to 241 

## 2017-02-20 NOTE — Consult Note (Signed)
[facility] Consultation Note  Patient ID: [name], MRN: [id number], DOB/AGE: Dec 08, 1940 77 y.o. Admit date: 02/19/2017   Date of Consult: 02/20/2017 Primary Physician: Cyndi Bender, PA-C Primary Cardiologist: Ubaldo Glassing  Chief Complaint:  Chief Complaint  Patient presents with  . Weakness   Reason for Consult: Heart failure  HPI: 77 y.o. male with known urinary artery disease sleep apnea old myocardial infarction chronic kidney disease stage IV with previous history of ventricular tachycardia on amiodarone for which the patient has new onset shortness of breath consistent with congestive heart failure.  The patient did have a 0.03 troponin level most consistent with demand ischemia but no evidence of myocardial infarction.  Patient since has felt very well with no evidence of pulmonary edema shortness of breath or lower extremity edema and in improved a great deal.  The patient has been ambulating well without any further issues but did have some short runs of ventricular tachycardia not significantly changed from before.  Past Medical History:  Diagnosis Date  . AICD (automatic cardioverter/defibrillator) present    [employer]  . Arthritis    RA  . Balance problem   . CHF (congestive heart failure) ([place])   . Collagen vascular disease ([place])    RA since 2014  . Coronary artery disease   . Deviated nasal septum   . Dysrhythmia   . Hearing loss    TINNITUS  . High cholesterol   . Leg weakness   . Myocardial infarction ([place])   . Presence of permanent cardiac pacemaker    WITH DEFIBRILATOR ([employer])  . Seasonal allergies    RHINITIS  . Shortness of breath dyspnea    COUGH  . Sinus headache   . Sinusitis    CHRONIC  . Sleep apnea   . Vertigo    DYSEQUILIBRIUM      Surgical History:  Past Surgical History:  Procedure Laterality Date  . CARDIAC SURGERY    . CATARACT EXTRACTION     BILATERAL  . CORONARY ANGIOPLASTY     STENTS  .  CORONARY ARTERY BYPASS GRAFT  1992  . heart pump    . INSERT / REPLACE / REMOVE PACEMAKER    . SEPTOPLASTY       Home Meds: Prior to Admission medications   Medication Sig Start Date End Date Taking? Authorizing Provider  acetaminophen (TYLENOL) 325 MG tablet Take 2 tablets (650 mg total) by mouth every 6 (six) hours as needed for mild pain (or Fever >/= 101). 07/18/14  Yes Gouru, Aruna, MD  amiodarone (PACERONE) 400 MG tablet Take 300 mg by mouth daily.    Yes [provider]  aspirin EC 81 MG tablet Take 81 mg by mouth daily.   Yes [provider]  cetirizine (ZYRTEC) 10 MG tablet Take 10 mg by mouth daily.   Yes [provider]  Cholecalciferol (VITAMIN D3) 2000 UNITS TABS Take 2,000 Units by mouth daily.    Yes [provider]  docusate sodium (COLACE) 100 MG capsule Take 100 mg by mouth every evening.   Yes [provider]  furosemide (LASIX) 40 MG tablet Take 40 mg by mouth daily as needed for edema.   Yes [provider]  leflunomide (ARAVA) 20 MG tablet Take 20 mg by mouth every evening.   Yes [provider]  levothyroxine (SYNTHROID, LEVOTHROID) 50 MCG tablet Take 1 tablet by mouth every morning. 02/13/17  Yes [provider]  metoprolol succinate (TOPROL-XL) 50 MG  24 hr tablet Take 1 tablet (50 mg total) by mouth 2 (two) times daily. Patient taking differently: Take 25 mg by mouth daily.  12/02/16  Yes Fritzi Mandes, MD  mexiletine (MEXITIL) 150 MG capsule Take 150 mg by mouth 3 (three) times daily.    Yes [provider]  nitroGLYCERIN (NITROSTAT) 0.4 MG SL tablet Place 0.4 mg under the tongue every 5 (five) minutes as needed for chest pain.   Yes [provider]  predniSONE (DELTASONE) 20 MG tablet Take 60 mg by mouth daily with breakfast.  02/16/17  Yes [provider]  traMADol (ULTRAM) 50 MG tablet Take 1 tablet by mouth 3 (three) times daily as needed. 02/18/17  Yes [provider]  vitamin B-12 (CYANOCOBALAMIN) 1000 MCG tablet Take 1,000 mcg by mouth daily.   Yes [provider]  cefUROXime (CEFTIN) 500 MG tablet Take 1 tablet by mouth 2 (two) times daily. 02/05/17   [provider]  fluticasone (FLONASE) 50 MCG/ACT nasal spray Place 1 spray into both nostrils daily as needed for rhinitis.     [provider]  spironolactone (ALDACTONE) 25 MG tablet Take 25 mg by mouth daily.    [provider]    Inpatient Medications:  . amiodarone  300 mg Oral Daily  . aspirin EC  81 mg Oral Daily  . cholecalciferol  2,000 Units Oral Daily  . docusate sodium  100 mg Oral BID  . heparin  5,000 Units Subcutaneous Q8H  . leflunomide  20 mg Oral QPM  . levothyroxine  50 mcg Oral QAC breakfast  . loratadine  10 mg Oral Daily  . mexiletine  150 mg Oral TID  . predniSONE  50 mg Oral Q breakfast  . vitamin B-12  1,000 mcg Oral Daily   . sodium chloride 75 mL/hr at 02/20/17 0106    Allergies:  Allergies  Allergen Reactions  . Pravastatin Other (See Comments)    Reaction:  Joint pain     Social History   Socioeconomic History  . Marital status: Married    Spouse name: [name]  . Number of children: 3  . Years of education: 78  . Highest education level: Not on file  Social Needs  . Financial resource strain: Not on file  . Food insecurity - worry: Not on file  . Food insecurity - inability: Not on file  . Transportation needs - medical: Not on file  . Transportation needs - non-medical: Not on file  Occupational History    Comment: Retired  Tobacco Use  . Smoking status: Never Smoker  . Smokeless tobacco: Never Used  Substance and Sexual Activity  . Alcohol use: No    Alcohol/week: 0.0 oz  . Drug use: No  . Sexual activity: Not on file  Other Topics Concern  . Not on file  Social History Narrative   Patient lives at home with his wife [name]).   Retired.   Education 12th grade.   Right handed.   Caffeine one  cup daily.     Family History  Problem Relation Age of Onset  . Alzheimer's disease Mother      Review of Systems Positive for shortness of breath Negative for: General:  chills, fever, night sweats or weight changes.  Cardiovascular: PND orthopnea syncope dizziness  Dermatological skin lesions rashes Respiratory: Cough congestion Urologic: Frequent urination urination at night and hematuria Abdominal: negative for nausea, vomiting, diarrhea, bright red blood per rectum, melena, or hematemesis Neurologic: negative for  visual changes, and/or hearing changes  All other systems reviewed and are otherwise negative except as noted above.  Labs: Recent Labs    02/19/17 1758  TROPONINI 0.03*   Lab Results  Component Value Date   WBC 11.9 (H) 02/20/2017   HGB 10.8 (L) 02/20/2017   HCT 33.3 (L) 02/20/2017   MCV 97.3 02/20/2017   PLT 183 02/20/2017    Recent Labs  Lab 02/20/17 0510  NA 124*  K 5.2*  CL 95*  CO2 12*  BUN 49*  CREATININE 2.10*  CALCIUM 8.1*  GLUCOSE 106*   No results found for: CHOL, HDL, LDLCALC, TRIG No results found for: DDIMER  Radiology/Studies:  Dg Chest 2 View  Result Date: 02/20/2017 CLINICAL DATA:  Shortness of breath. EXAM: CHEST  2 VIEW COMPARISON:  02/25/2016. FINDINGS: AICD in stable position. A catheter is noted overlying the right mid chest. This could represent a catheter overlying the chest. Prior CABG. Stable cardiomegaly. Low lung volumes. No focal infiltrate. No pleural effusion or pneumothorax. Old right rib fractures. IMPRESSION: 1. AICD in stable position. Prior CABG. Cardiomegaly. No pulmonary venous congestion. 2.  Low lung volumes. Electronically Signed   By: Marcello Moores  Register   On: 02/20/2017 07:33    EKG: Normal sinus rhythm with left axis deviation right bundle branch block  Weights: Filed Weights   02/19/17 1750 02/20/17 0100  Weight: 183 lb (83 kg) 192 lb 3.2 oz (87.2 kg)     Physical Exam: Blood pressure 108/65,  pulse (!) 57, temperature 97.9 F (36.6 C), temperature source Oral, resp. rate 16, height 5\' 8"  (1.727 m), weight 192 lb 3.2 oz (87.2 kg), SpO2 97 %. Body mass index is 29.22 kg/m. General: Well developed, well nourished, in no acute distress. Head eyes ears nose throat: Normocephalic, atraumatic, sclera non-icteric, no xanthomas, nares are without discharge. No apparent thyromegaly and/or mass  Lungs: Normal respiratory effort.  no wheezes, no rales, no rhonchi.  Heart: RRR with normal S1 S2. no murmur gallop, no rub, PMI is normal size and placement, carotid upstroke normal without bruit, jugular venous pressure is normal Abdomen: Soft, non-tender, non-distended with normoactive bowel sounds. No hepatomegaly. No rebound/guarding. No obvious abdominal masses. Abdominal aorta is normal size without bruit Extremities: No edema. no cyanosis, no clubbing, no ulcers  Peripheral : 2+ bilateral upper extremity pulses, 2+ bilateral femoral pulses, 2+ bilateral dorsal pedal pulse Neuro: Alert and oriented. No facial asymmetry. No focal deficit. Moves all extremities spontaneously. Musculoskeletal: Normal muscle tone without kyphosis Psych:  Responds to questions appropriately with a normal affect.    Assessment: 77 year old male with known dilated cardiomyopathy coronary artery disease with acute on chronic systolic dysfunction heart failure sleep apnea with a run of ventricular tachycardia most consistent with heart failure and hypoxia and no current evidence of myocardial infarction  Plan: 1.  Continue diuresis and oxygenation for acute on chronic systolic dysfunction heart failure 2.  Continue treatment with amiodarone for ventricular tachycardia 3.  No further cardiac diagnostics necessary at this time 4.  Begin ambulation and follow for improvements of symptoms and possible discharged home tomorrow  Signed, Corey Skains M.D. [facility] 02/20/2017, 4:00 PM

## 2017-02-20 NOTE — ED Notes (Addendum)
Verbal order confirmed from Dr. Lucky Rathke for 2nd bolus of 1,000 mL to be run at 110mL an hour

## 2017-02-20 NOTE — Care Management (Signed)
Patient admitted from home for hyponatremia.  Is currently open to [employer] and PT.

## 2017-02-21 LAB — BASIC METABOLIC PANEL
ANION GAP: 18 — AB (ref 5–15)
BUN: 65 mg/dL — ABNORMAL HIGH (ref 6–20)
CALCIUM: 7.8 mg/dL — AB (ref 8.9–10.3)
CO2: 14 mmol/L — AB (ref 22–32)
Chloride: 95 mmol/L — ABNORMAL LOW (ref 101–111)
Creatinine, Ser: 2.24 mg/dL — ABNORMAL HIGH (ref 0.61–1.24)
GFR, EST AFRICAN AMERICAN: 31 mL/min — AB (ref >60)
GFR, EST NON AFRICAN AMERICAN: 27 mL/min — AB (ref >60)
GLUCOSE: 123 mg/dL — AB (ref 65–99)
Potassium: 5.9 mmol/L — ABNORMAL HIGH (ref 3.5–5.1)
Sodium: 127 mmol/L — ABNORMAL LOW (ref 135–145)

## 2017-02-21 LAB — GLUCOSE, CAPILLARY
GLUCOSE-CAPILLARY: 126 mg/dL — AB (ref 65–99)
Glucose-Capillary: 106 mg/dL — ABNORMAL HIGH (ref 65–99)
Glucose-Capillary: 128 mg/dL — ABNORMAL HIGH (ref 65–99)

## 2017-02-21 LAB — POTASSIUM: POTASSIUM: 4.4 mmol/L (ref 3.5–5.1)

## 2017-02-21 MED ORDER — NYSTATIN 100000 UNIT/ML MT SUSP
5.0000 mL | Freq: Four times a day (QID) | OROMUCOSAL | Status: DC
  Administered 2017-02-21 – 2017-02-22 (×4): 500000 [IU] via ORAL
  Filled 2017-02-21 (×4): qty 5

## 2017-02-21 MED ORDER — DEXTROSE 50 % IV SOLN
50.0000 mL | Freq: Once | INTRAVENOUS | Status: AC
  Administered 2017-02-21: 50 mL via INTRAVENOUS

## 2017-02-21 MED ORDER — INSULIN ASPART 100 UNIT/ML IV SOLN
10.0000 [IU] | Freq: Once | INTRAVENOUS | Status: AC
  Administered 2017-02-21: 10 [IU] via INTRAVENOUS
  Filled 2017-02-21: qty 0.1

## 2017-02-21 MED ORDER — SODIUM POLYSTYRENE SULFONATE 15 GM/60ML PO SUSP
30.0000 g | Freq: Once | ORAL | Status: AC
  Administered 2017-02-21: 30 g via ORAL
  Filled 2017-02-21: qty 120

## 2017-02-21 MED ORDER — SODIUM CHLORIDE 0.9 % IV SOLN
1.0000 g | Freq: Once | INTRAVENOUS | Status: AC
  Administered 2017-02-21: 1 g via INTRAVENOUS
  Filled 2017-02-21: qty 10

## 2017-02-21 MED ORDER — DEXTROSE 250 MG/ML IV SOLN
25.0000 g | Freq: Once | INTRAVENOUS | Status: DC
  Filled 2017-02-21: qty 100

## 2017-02-21 MED ORDER — DEXTROSE 50 % IV SOLN
INTRAVENOUS | Status: AC
  Administered 2017-02-21: 50 mL via INTRAVENOUS
  Filled 2017-02-21: qty 50

## 2017-02-21 NOTE — Evaluation (Signed)
Physical Therapy Evaluation Patient Details Name: [name] MRN: [id number] DOB: 1940/11/14 Today's Date: 02/21/2017   History of Present Illness  [name] is a 77yo white male who comes to [place] on 2/14 s/p a "few days" of generalized weakness, SOB, and nausea. Pt reports 4 falls in the past 3 months, 3 rleated to cardiac problems. PMH: CHF, RA, CAD, VT, s/p AICD, recent prednisone, MI, vertigo. At baseline, pt tolerates household AMB with SPC only, has been current with HHPT since D/C from [place], but feels he has mqade onyl limited progress d/t inability to rest sufficicently since long hospitalization.   Clinical Impression  Pt admitted with above diagnosis. Pt currently with functional limitations due to the deficits listed below (see "PT Problem List"). Upon entry, the patient is received supine in bed, wife present. The pt is awake but somnolent and agreeable to participate. Pt c/o generalized fatigue and exhaustion, breathlessness interrupting the continuity of his sentences. The pt is alert and oriented x3, pleasant, conversational, and following simple and multi-step commands consistently. Functional mobility assessment demonstrates severe strength impairment globally, the pt now requiring Mod-assist physical assistance for bed mobility, and maximal effort to perform transfers; pt is unable to attempt safe AMB at this time.The patient performed these at a higher level of independence PTA. The patient is educated on the importance of staying current with rehabilitation at home, rather than waiting on self-imposed bed rest until he feels better. The patient is reluctant to accept recommendations for skilled placement. He then questions the recommendation for [facility] for mobility from PT shoulder he decide to DC to home, showing limited insight into his current deficits. Pt will benefit from skilled PT intervention to increase independence and safety with basic mobility in preparation for  discharge to the venue listed below.       Follow Up Recommendations SNF;Supervision for mobility/OOB;Supervision/Assistance - 24 hour    Equipment Recommendations  (If pt refuses STR placement, pt will need WC for safe mobility within home. )    Recommendations for Other Services       Precautions / Restrictions Precautions Precautions: Fall Restrictions Weight Bearing Restrictions: No      Mobility  Bed Mobility Overal bed mobility: Needs Assistance Bed Mobility: Supine to Sit     Supine to sit: Min assist     General bed mobility comments: pt pulls self to sitting with holding hand of PT.   Transfers Overall transfer level: Needs assistance Equipment used: 1 person hand held assist Transfers: Sit to/from Stand Sit to Stand: Min assist         General transfer comment: requires near maximal effort to rise 1x   Ambulation/Gait Ambulation/Gait assistance: (not appropriate at this time, )              Stairs            Wheelchair Mobility    Modified Rankin (Stroke Patients Only)       Balance Overall balance assessment: Needs assistance;Mild deficits observed, not formally tested;History of Falls                                           Pertinent Vitals/Pain Pain Assessment: No/denies pain    Home Living Family/patient expects to be discharged to:: Private residence Living Arrangements: Spouse/significant other Available Help at Discharge: Family Type of Home: House Home Access: Stairs to  enter Entrance Stairs-Rails: Right Entrance Stairs-Number of Steps: 3 at carport with 1 rail.  Home Layout: One level Home Equipment: Walker - 2 wheels;Cane - single point;Bedside commode;Shower seat      Prior Function Level of Independence: Needs assistance   Gait / Transfers Assistance Needed: SPC for household distances           Hand Dominance        Extremity/Trunk Assessment   Upper Extremity  Assessment Upper Extremity Assessment: Generalized weakness    Lower Extremity Assessment Lower Extremity Assessment: Generalized weakness       Communication      Cognition Arousal/Alertness: Awake/alert Behavior During Therapy: WFL for tasks assessed/performed Overall Cognitive Status: Within Functional Limits for tasks assessed                                        General Comments      Exercises     Assessment/Plan    PT Assessment Patient needs continued PT services  PT Problem List Decreased strength;Decreased activity tolerance;Decreased mobility;Decreased safety awareness       PT Treatment Interventions DME instruction;Balance training;Gait training;Stair training;Patient/family education;Therapeutic activities;Functional mobility training;Therapeutic exercise    PT Goals (Current goals can be found in the Care Plan section)  Acute Rehab PT Goals Patient Stated Goal: pt wishes to gain mor erest to improve his current state of health PT Goal Formulation: With patient Time For Goal Achievement: 03/07/17 Potential to Achieve Goals: Fair    Frequency Min 2X/week   Barriers to discharge Inaccessible home environment      Co-evaluation               AM-PAC PT "6 Clicks" Daily Activity  Outcome Measure Difficulty turning over in bed (including adjusting bedclothes, sheets and blankets)?: A Lot Difficulty moving from lying on back to sitting on the side of the bed? : A Lot Difficulty sitting down on and standing up from a chair with arms (e.g., wheelchair, bedside commode, etc,.)?: Unable Help needed moving to and from a bed to chair (including a wheelchair)?: A Lot Help needed walking in hospital room?: Total Help needed climbing 3-5 steps with a railing? : Total 6 Click Score: 9    End of Session   Activity Tolerance: Patient tolerated treatment well;Patient limited by fatigue;Patient limited by lethargy Patient left: in bed;with  call bell/phone within reach;with bed alarm set;with family/visitor present Nurse Communication: Mobility status PT Visit Diagnosis: Muscle weakness (generalized) (M62.81);Other abnormalities of gait and mobility (R26.89);Difficulty in walking, not elsewhere classified (R26.2)    Time: 1430-1455 PT Time Calculation (min) (ACUTE ONLY): 25 min   Charges:   PT Evaluation $PT Eval High Complexity: 1 High PT Treatments $Therapeutic Activity: 8-22 mins   PT G Codes:        3:10 PM, 03/20/17 Etta Grandchild, PT, DPT Physical Therapist - [place] [phone] ([place])  [phone] (mobile)   Thirza Pellicano C 2017-03-20, 3:06 PM

## 2017-02-21 NOTE — Progress Notes (Signed)
Patient ID: [name], male   DOB: 1940-08-18, 77 y.o.   MRN: [id number]  [facility] PROGRESS NOTE  [name] NTI:[id number] DOB: 1940/10/26 DOA: 02/19/2017 PCP: Jermaine Bender, PA-C  HPI/Subjective: Patient feels better than when he came in.  He was on 3 courses of antibiotics for leg cellulitis.  The last antibiotic was Bactrim.  He was admitted with dehydration.  He has had poor appetite at home.  Objective: Vitals:   02/21/17 0415 02/21/17 0802  BP: 117/62 119/71  Pulse: 62 63  Resp: 18 18  Temp: 98 F (36.7 C) (!) 97.3 F (36.3 C)  SpO2: 94% 94%    Filed Weights   02/19/17 1750 02/20/17 0100  Weight: 83 kg (183 lb) 87.2 kg (192 lb 3.2 oz)    ROS: Review of Systems  Constitutional: Negative for chills and fever.  Eyes: Negative for blurred vision.  Respiratory: Positive for shortness of breath. Negative for cough.   Cardiovascular: Negative for chest pain.  Gastrointestinal: Negative for abdominal pain, constipation, diarrhea, nausea and vomiting.  Genitourinary: Negative for dysuria.  Musculoskeletal: Negative for joint pain.  Neurological: Negative for dizziness and headaches.   Exam: Physical Exam  Constitutional: He is oriented to person, place, and time.  HENT:  Nose: No mucosal edema.  Mouth/Throat: No oropharyngeal exudate or posterior oropharyngeal edema.  Thrush seen in the mouth  Eyes: Conjunctivae, EOM and lids are normal. Pupils are equal, round, and reactive to light.  Neck: No JVD present. Carotid bruit is not present. No edema present. No thyroid mass and no thyromegaly present.  Cardiovascular: S1 normal and S2 normal. Exam reveals no gallop.  No murmur heard. Pulses:      Dorsalis pedis pulses are 2+ on the right side, and 2+ on the left side.  Respiratory: No respiratory distress. He has decreased breath sounds in the right lower field and the left lower field. He has no wheezes. He has no rhonchi. He has no rales.  GI: Soft.  Bowel sounds are normal. There is no tenderness.  Musculoskeletal:       Right ankle: He exhibits swelling.       Left ankle: He exhibits swelling.  Lymphadenopathy:    He has no cervical adenopathy.  Neurological: He is alert and oriented to person, place, and time. No cranial nerve deficit.  Skin: Skin is warm. Nails show no clubbing.  Right leg with numerous scab seen.  No surrounding cellulitis seen.  Psychiatric: He has a normal mood and affect.      Data Reviewed: Basic Metabolic Panel: Recent Labs  Lab 02/19/17 1758 02/20/17 0510 02/21/17 0453  NA 126* 124* 127*  K 5.0 5.2* 5.9*  CL 93* 95* 95*  CO2 17* 12* 14*  GLUCOSE 166* 106* 123*  BUN 45* 49* 65*  CREATININE 1.92* 2.10* 2.24*  CALCIUM 8.7* 8.1* 7.8*   CBC: Recent Labs  Lab 02/19/17 1758 02/20/17 0622  WBC 11.2* 11.9*  HGB 11.1* 10.8*  HCT 34.4* 33.3*  MCV 98.4 97.3  PLT 226 183   Cardiac Enzymes: Recent Labs  Lab 02/19/17 1758  TROPONINI 0.03*   BNP (last 3 results) Recent Labs    02/20/17 0622  BNP 1,621.0*     CBG: Recent Labs  Lab 02/20/17 0753 02/21/17 0819 02/21/17 1250  GLUCAP 94 106* 126*     Studies: Dg Chest 2 View  Result Date: 02/20/2017 CLINICAL DATA:  Shortness of breath. EXAM: CHEST  2 VIEW COMPARISON:  02/25/2016.  FINDINGS: AICD in stable position. A catheter is noted overlying the right mid chest. This could represent a catheter overlying the chest. Prior CABG. Stable cardiomegaly. Low lung volumes. No focal infiltrate. No pleural effusion or pneumothorax. Old right rib fractures. IMPRESSION: 1. AICD in stable position. Prior CABG. Cardiomegaly. No pulmonary venous congestion. 2.  Low lung volumes. Electronically Signed   By: Marcello Moores  Register   On: 02/20/2017 07:33    Scheduled Meds: . amiodarone  300 mg Oral Daily  . aspirin EC  81 mg Oral Daily  . cholecalciferol  2,000 Units Oral Daily  . docusate sodium  100 mg Oral BID  . heparin  5,000 Units Subcutaneous Q8H   . leflunomide  20 mg Oral QPM  . levothyroxine  50 mcg Oral QAC breakfast  . loratadine  10 mg Oral Daily  . mexiletine  150 mg Oral TID  . nystatin  5 mL Oral QID  . predniSONE  50 mg Oral Q breakfast  . vitamin B-12  1,000 mcg Oral Daily   Continuous Infusions: . sodium chloride 50 mL/hr at 02/21/17 1213    Assessment/Plan:  1. Acute kidney injury on chronic kidney disease stage III.  Monitor with gentle IV fluid hydration.  Hold diuretics and meds that can affect kidney function.  Bactrim could also be a culprit. 2. Hyponatremia.  Bactrim could also be a culprit along with his poor appetite.  Gentle IV fluid hydration. 3. Hyperkalemia.  Give Kayexalate.  Insulin and D50 and calcium given.  Recheck potassium today. 4. History of ventricular tachycardia on amiodarone and mexiletine 5. Hypothyroidism unspecified on levothyroxine 6. Temporal arteritis on prednisone 7. Weakness we will get physical therapy evaluation for tomorrow 8. History systolic congestive heart failure.  No signs currently.  Watch closely with IV fluid hydration. 9. Oral thrush.  Start nystatin swish and swallow  Code Status:     Code Status Orders  (From admission, onward)        Start     Ordered   02/20/17 0042  Do not attempt resuscitation (DNR)  Continuous    Question Answer Comment  In the event of cardiac or respiratory ARREST Do not call a "code blue"   In the event of cardiac or respiratory ARREST Do not perform Intubation, CPR, defibrillation or ACLS   In the event of cardiac or respiratory ARREST Use medication by any route, position, wound care, and other measures to relive pain and suffering. May use oxygen, suction and manual treatment of airway obstruction as needed for comfort.   Comments Nurse may pronounce      02/20/17 0041    Code Status History    Date Active Date Inactive Code Status Order ID Comments User Context   12/02/2016 02:25 12/02/2016 18:36 DNR [id number]  Saundra Shelling,  MD ED   08/18/2014 22:15 08/20/2014 14:38 DNR [id number]  Loletha Grayer, MD ED   07/15/2014 03:26 07/18/2014 17:28 Full Code [id number]  [name], MD ED    Advance Directive Documentation     Most Recent Value  Type of Advance Directive  Living will  Pre-existing out of facility DNR order (yellow form or pink MOST form)  No data  "MOST" Form in Place?  No data     Family Communication: Wife at the bedside Disposition Plan: To be determined  Consultants:  Cardiology  Nephrology  Time spent: 28 minutes  [place]

## 2017-02-21 NOTE — Consult Note (Signed)
[facility] CONSULT NOTE    Date: 02/21/2017                  Patient Name:  [name]  MRN: [id number]  DOB: 1940/03/04  Age / Sex: 77 y.o., male         PCP: Cyndi Bender, PA-C                 Service Requesting Consult: Hospitalist                 Reason for Consult: Acute renal failure/CKD stage III            History of Present Illness: Patient is a 77 y.o. male with a PMHx of congestive heart failure, rheumatoid arthritis, coronary artery disease, AICD placement, myocardial infarction, chronic kidney disease stage III baseline EGFR 57, obstructive sleep apnea, vertigo, who was admitted to [facility] on 02/19/2017 for evaluation of weakness.  Recently as an outpatient he was being treated for lower extremity cellulitis with Bactrim.  He also apparently was recently having headaches and there was some concern for temporal arteritis.  He was on redness on for this issue.  He presented with weakness.  He has had rather poor p.o. intake at home of the past several days.  Patient also appears to have hyponatremia with a serum sodium of 127.  He denies ingestion of any NSAIDs.   Medications: Outpatient medications: Medications Prior to Admission  Medication Sig Dispense Refill Last Dose  . acetaminophen (TYLENOL) 325 MG tablet Take 2 tablets (650 mg total) by mouth every 6 (six) hours as needed for mild pain (or Fever >/= 101).   02/19/2017 at Unknown time  . amiodarone (PACERONE) 400 MG tablet Take 300 mg by mouth daily.    02/18/2017 at Unknown time  . aspirin EC 81 MG tablet Take 81 mg by mouth daily.   02/19/2017 at Unknown time  . cetirizine (ZYRTEC) 10 MG tablet Take 10 mg by mouth daily.   02/19/2017 at Unknown time  . Cholecalciferol (VITAMIN D3) 2000 UNITS TABS Take 2,000 Units by mouth daily.    02/19/2017 at Unknown time  . docusate sodium (COLACE) 100 MG capsule Take 100 mg by mouth every evening.   02/18/2017 at Unknown time  . furosemide (LASIX) 40 MG  tablet Take 40 mg by mouth daily as needed for edema.   prn at prn  . leflunomide (ARAVA) 20 MG tablet Take 20 mg by mouth every evening.   02/18/2017 at Unknown time  . levothyroxine (SYNTHROID, LEVOTHROID) 50 MCG tablet Take 1 tablet by mouth every morning.   02/19/2017 at Unknown time  . metoprolol succinate (TOPROL-XL) 50 MG 24 hr tablet Take 1 tablet (50 mg total) by mouth 2 (two) times daily. (Patient taking differently: Take 25 mg by mouth daily. ) 60 tablet 1 02/19/2017 at Unknown time  . mexiletine (MEXITIL) 150 MG capsule Take 150 mg by mouth 3 (three) times daily.    02/19/2017 at Unknown time  . nitroGLYCERIN (NITROSTAT) 0.4 MG SL tablet Place 0.4 mg under the tongue every 5 (five) minutes as needed for chest pain.   prn at prn  . predniSONE (DELTASONE) 20 MG tablet Take 60 mg by mouth daily with breakfast.    02/19/2017 at Unknown time  . traMADol (ULTRAM) 50 MG tablet Take 1 tablet by mouth 3 (three) times daily as needed.   prn at prn  . vitamin B-12 (CYANOCOBALAMIN) 1000 MCG tablet  Take 1,000 mcg by mouth daily.   02/19/2017 at Unknown time  . cefUROXime (CEFTIN) 500 MG tablet Take 1 tablet by mouth 2 (two) times daily.   Not Taking at Unknown time  . fluticasone (FLONASE) 50 MCG/ACT nasal spray Place 1 spray into both nostrils daily as needed for rhinitis.    Not Taking at Unknown time  . spironolactone (ALDACTONE) 25 MG tablet Take 25 mg by mouth daily.   Not Taking at Unknown time    Current medications: Current Facility-Administered Medications  Medication Dose Route Frequency Provider Last Rate Last Dose  . 0.9 %  sodium chloride infusion   Intravenous Continuous Loletha Grayer, MD 50 mL/hr at 02/21/17 1213    . acetaminophen (TYLENOL) tablet 650 mg  650 mg Oral Q6H PRN Amelia Jo, MD   650 mg at 02/21/17 1511   Or  . acetaminophen (TYLENOL) suppository 650 mg  650 mg Rectal Q6H PRN Amelia Jo, MD      . amiodarone (PACERONE) tablet 300 mg  300 mg Oral Daily Amelia Jo,  MD   300 mg at 02/21/17 1038  . aspirin EC tablet 81 mg  81 mg Oral Daily Amelia Jo, MD   81 mg at 02/21/17 1038  . bisacodyl (DULCOLAX) EC tablet 5 mg  5 mg Oral Daily PRN Amelia Jo, MD      . cholecalciferol (VITAMIN D) tablet 2,000 Units  2,000 Units Oral Daily Amelia Jo, MD   2,000 Units at 02/21/17 1038  . docusate sodium (COLACE) capsule 100 mg  100 mg Oral BID Amelia Jo, MD   100 mg at 02/21/17 1038  . fluticasone (FLONASE) 50 MCG/ACT nasal spray 1 spray  1 spray Each Nare Daily PRN Amelia Jo, MD      . heparin injection 5,000 Units  5,000 Units Subcutaneous Q8H Amelia Jo, MD   5,000 Units at 02/21/17 1452  . HYDROcodone-acetaminophen (NORCO/VICODIN) 5-325 MG per tablet 1-2 tablet  1-2 tablet Oral Q4H PRN Amelia Jo, MD      . leflunomide (ARAVA) tablet 20 mg  20 mg Oral QPM Amelia Jo, MD   20 mg at 02/20/17 1823  . levothyroxine (SYNTHROID, LEVOTHROID) tablet 50 mcg  50 mcg Oral QAC breakfast Amelia Jo, MD   50 mcg at 02/21/17 0844  . loratadine (CLARITIN) tablet 10 mg  10 mg Oral Daily Amelia Jo, MD   10 mg at 02/21/17 1038  . metoCLOPramide (REGLAN) tablet 5 mg  5 mg Oral Q6H PRN Demetrios Loll, MD       Or  . metoCLOPramide (REGLAN) injection 5 mg  5 mg Intravenous Q6H PRN Demetrios Loll, MD      . mexiletine (MEXITIL) capsule 150 mg  150 mg Oral TID Amelia Jo, MD   150 mg at 02/21/17 1541  . nystatin (MYCOSTATIN) 100000 UNIT/ML suspension 500,000 Units  5 mL Oral QID Loletha Grayer, MD   500,000 Units at 02/21/17 1452  . predniSONE (DELTASONE) tablet 50 mg  50 mg Oral Q breakfast Demetrios Loll, MD   50 mg at 02/21/17 0844  . traMADol (ULTRAM) tablet 50 mg  50 mg Oral TID PRN Amelia Jo, MD   50 mg at 02/20/17 2107  . traZODone (DESYREL) tablet 25 mg  25 mg Oral QHS PRN Amelia Jo, MD      . vitamin B-12 (CYANOCOBALAMIN) tablet 1,000 mcg  1,000 mcg Oral Daily Amelia Jo, MD   1,000 mcg at 02/21/17 1038      Allergies:  Allergies  Allergen  Reactions  . Pravastatin Other (See Comments)    Reaction:  Joint pain       Past Medical History: Past Medical History:  Diagnosis Date  . AICD (automatic cardioverter/defibrillator) present    [employer]  . Arthritis    RA  . Balance problem   . CHF (congestive heart failure) ([place])   . Collagen vascular disease ([employer])    RA since 2014  . Coronary artery disease   . Deviated nasal septum   . Dysrhythmia   . Hearing loss    TINNITUS  . High cholesterol   . Leg weakness   . Myocardial infarction ([place])   . Presence of permanent cardiac pacemaker    WITH DEFIBRILATOR ([employer])  . Seasonal allergies    RHINITIS  . Shortness of breath dyspnea    COUGH  . Sinus headache   . Sinusitis    CHRONIC  . Sleep apnea   . Vertigo    DYSEQUILIBRIUM     Past Surgical History: Past Surgical History:  Procedure Laterality Date  . CARDIAC SURGERY    . CATARACT EXTRACTION     BILATERAL  . CORONARY ANGIOPLASTY     STENTS  . CORONARY ARTERY BYPASS GRAFT  1992  . heart pump    . INSERT / REPLACE / REMOVE PACEMAKER    . SEPTOPLASTY       Family History: Family History  Problem Relation Age of Onset  . Alzheimer's disease Mother      Social History: Social History   Socioeconomic History  . Marital status: Married    Spouse name: [name]  . Number of children: 3  . Years of education: 103  . Highest education level: Not on file  Social Needs  . Financial resource strain: Not on file  . Food insecurity - worry: Not on file  . Food insecurity - inability: Not on file  . Transportation needs - medical: Not on file  . Transportation needs - non-medical: Not on file  Occupational History    Comment: Retired  Tobacco Use  . Smoking status: Never Smoker  . Smokeless tobacco: Never Used  Substance and Sexual Activity  . Alcohol use: No    Alcohol/week: 0.0 oz  . Drug use: No  . Sexual activity: Not on file  Other Topics Concern  . Not on file  Social  History Narrative   Patient lives at home with his wife [name]).   Retired.   Education 12th grade.   Right handed.   Caffeine one cup daily.     Review of Systems: Review of Systems  Constitutional: Positive for malaise/fatigue. Negative for chills and fever.  HENT: Negative for ear pain, hearing loss and tinnitus.   Eyes: Negative for blurred vision and double vision.  Respiratory: Negative for cough, hemoptysis and sputum production.   Cardiovascular: Negative for chest pain, palpitations and orthopnea.  Gastrointestinal: Positive for nausea. Negative for abdominal pain, heartburn and vomiting.  Genitourinary: Negative for dysuria, frequency and urgency.  Musculoskeletal: Negative for joint pain and myalgias.  Skin: Positive for rash.  Neurological: Positive for weakness. Negative for dizziness.  Endo/Heme/Allergies: Negative for polydipsia. Does not bruise/bleed easily.  Psychiatric/Behavioral: Negative for depression and hallucinations.     Vital Signs: Blood pressure 119/71, pulse 74, temperature (!) 97.3 F (36.3 C), temperature source Oral, resp. rate 18, height 5' 8"  (1.727 m), weight 87.2 kg (192 lb 3.2 oz), SpO2 96 %.  Weight trends: Danley Danker  Weights   02/19/17 1750 02/20/17 0100  Weight: 83 kg (183 lb) 87.2 kg (192 lb 3.2 oz)    Physical Exam: General: NAD, resting in bed  Head: Normocephalic, atraumatic.  Eyes: Anicteric, EOMI  Nose: Mucous membranes moist, not inflammed, nonerythematous.  Throat: Oropharynx nonerythematous, no exudate appreciated.   Neck: Supple, trachea midline.  Lungs:  Normal respiratory effort. Clear to auscultation BL without crackles or wheezes.  Heart: S1S2 no rubs.  Abdomen:  BS normoactive. Soft, Nondistended, non-tender.  No masses or organomegaly.  Extremities: No pretibial edema.  Neurologic: A&O X3, Motor strength is 5/5 in the all 4 extremities  Skin: Excoriations RLE    Lab results: Basic Metabolic Panel: Recent Labs  Lab  02/19/17 1758 02/20/17 0510 02/21/17 0453 02/21/17 1359  NA 126* 124* 127*  --   K 5.0 5.2* 5.9* 4.4  CL 93* 95* 95*  --   CO2 17* 12* 14*  --   GLUCOSE 166* 106* 123*  --   BUN 45* 49* 65*  --   CREATININE 1.92* 2.10* 2.24*  --   CALCIUM 8.7* 8.1* 7.8*  --     Liver Function Tests: No results for input(s): AST, ALT, ALKPHOS, BILITOT, PROT, ALBUMIN in the last 168 hours. No results for input(s): LIPASE, AMYLASE in the last 168 hours. No results for input(s): AMMONIA in the last 168 hours.  CBC: Recent Labs  Lab 02/19/17 1758 02/20/17 0622  WBC 11.2* 11.9*  HGB 11.1* 10.8*  HCT 34.4* 33.3*  MCV 98.4 97.3  PLT 226 183    Cardiac Enzymes: Recent Labs  Lab 02/19/17 1758  TROPONINI 0.03*    BNP: Invalid input(s): POCBNP  CBG: Recent Labs  Lab 02/20/17 0753 02/21/17 0819 02/21/17 1250 02/21/17 1710  GLUCAP 94 106* 126* 128*    Microbiology: No results found for this or any previous visit.  Coagulation Studies: No results for input(s): LABPROT, INR in the last 72 hours.  Urinalysis: Recent Labs    02/19/17 2131  COLORURINE YELLOW  LABSPEC >1.030*  PHURINE 5.0  GLUCOSEU NEGATIVE  HGBUR NEGATIVE  BILIRUBINUR SMALL*  KETONESUR NEGATIVE  PROTEINUR 30*  NITRITE NEGATIVE  LEUKOCYTESUR NEGATIVE      Imaging: Dg Chest 2 View  Result Date: 02/20/2017 CLINICAL DATA:  Shortness of breath. EXAM: CHEST  2 VIEW COMPARISON:  02/25/2016. FINDINGS: AICD in stable position. A catheter is noted overlying the right mid chest. This could represent a catheter overlying the chest. Prior CABG. Stable cardiomegaly. Low lung volumes. No focal infiltrate. No pleural effusion or pneumothorax. Old right rib fractures. IMPRESSION: 1. AICD in stable position. Prior CABG. Cardiomegaly. No pulmonary venous congestion. 2.  Low lung volumes. Electronically Signed   By: Marcello Moores  Register   On: 02/20/2017 07:33      Assessment & Plan: Pt is a 77 y.o. male  with a PMHx of  congestive heart failure, rheumatoid arthritis, coronary artery disease, AICD placement, myocardial infarction, chronic kidney disease stage III baseline EGFR 57, obstructive sleep apnea, vertigo, who was admitted to [facility] on 02/19/2017 for evaluation of weakness.  1.  Acute renal failure/chronic kidney disease stage III/proteinuria.  The patient has known chronic kidney disease with a baseline EGFR of 57.  Acute renal failure likely related to recent poor p.o. intake as well as use of Bactrim.  Maintain the patient on 0.9 normal saline at 50 cc/h.  Check renal ultrasound to make sure there is no underlying obstruction.  2.  Hyponatremia.  Serum sodium  currently 127.  Continue to monitor serum sodium as we hydrate the patient.  3.  Hyperkalemia.  Serum potassium was 5.9 earlier this a.m. but is now down to 4.4.  Suspect that Bactrim was leading to the hyperkalemia.  Continue to monitor serum potassium.  4.  Thanks for consultation.

## 2017-02-21 NOTE — Progress Notes (Signed)
[facility] Encounter Note  Patient: [name] / Admit Date: 02/19/2017 / Date of Encounter: 02/21/2017, 6:11 AM   Subjective: Patient is still somewhat confused today and unclear whether this is due to hyponatremia or consistent with his.  Patient remains with hyponatremia despite current treatment.  Patient has not had any significant heart failure type symptoms and exam is improved despite elevated BNP and significant acute on chronic renal failure.  Heart rate better controlled at this time on appropriate medication management.  No evidence of significant ventricular tachycardia.  No evidence of myocardial infarction with this admission with a troponin of 0.03 consistent with demand ischemia  Review of Systems: Positive for: Shortness of breath Negative for: Vision change, hearing change, syncope, dizziness, nausea, vomiting,diarrhea, bloody stool, stomach pain, cough, congestion, diaphoresis, urinary frequency, urinary pain,skin lesions, skin rashes Others previously listed  Objective: Telemetry: Normal sinus rhythm Physical Exam: Blood pressure 117/62, pulse 62, temperature 98 F (36.7 C), resp. rate 18, height 5\' 8"  (1.727 m), weight 192 lb 3.2 oz (87.2 kg), SpO2 94 %. Body mass index is 29.22 kg/m. General: Well developed, well nourished, in no acute distress. Head: Normocephalic, atraumatic, sclera non-icteric, no xanthomas, nares are without discharge. Neck: No apparent masses Lungs: Normal respirations with no wheezes, no rhonchi, no rales , no crackles   Heart: Regular rate and rhythm, normal S1 S2, no murmur, no rub, no gallop, PMI is normal size and placement, carotid upstroke normal without bruit, jugular venous pressure normal Abdomen: Soft, non-tender, non-distended with normoactive bowel sounds. No hepatosplenomegaly. Abdominal aorta is normal size without bruit Extremities: Trace edema, no clubbing, no cyanosis, no ulcers,  Peripheral: 2+  radial, 2+ femoral, 2+ dorsal pedal pulses Neuro: Not alert and oriented. Moves all extremities spontaneously. Psych: Does not responds to questions appropriately with a normal affect.   Intake/Output Summary (Last 24 hours) at 02/21/2017 0611 Last data filed at 02/21/2017 0223 Gross per 24 hour  Intake 780 ml  Output 350 ml  Net 430 ml    Inpatient Medications:  . amiodarone  300 mg Oral Daily  . aspirin EC  81 mg Oral Daily  . cholecalciferol  2,000 Units Oral Daily  . docusate sodium  100 mg Oral BID  . heparin  5,000 Units Subcutaneous Q8H  . leflunomide  20 mg Oral QPM  . levothyroxine  50 mcg Oral QAC breakfast  . loratadine  10 mg Oral Daily  . mexiletine  150 mg Oral TID  . predniSONE  50 mg Oral Q breakfast  . vitamin B-12  1,000 mcg Oral Daily   Infusions:  . sodium chloride 75 mL/hr at 02/20/17 1824    Labs: Recent Labs    02/20/17 0510 02/21/17 0453  NA 124* 127*  K 5.2* 5.9*  CL 95* 95*  CO2 12* 14*  GLUCOSE 106* 123*  BUN 49* 65*  CREATININE 2.10* 2.24*  CALCIUM 8.1* 7.8*   No results for input(s): AST, ALT, ALKPHOS, BILITOT, PROT, ALBUMIN in the last 72 hours. Recent Labs    02/19/17 1758 02/20/17 0622  WBC 11.2* 11.9*  HGB 11.1* 10.8*  HCT 34.4* 33.3*  MCV 98.4 97.3  PLT 226 183   Recent Labs    02/19/17 1758  TROPONINI 0.03*   Invalid input(s): POCBNP No results for input(s): HGBA1C in the last 72 hours.   Weights: Filed Weights   02/19/17 1750 02/20/17 0100  Weight: 183 lb (83 kg) 192 lb 3.2 oz (87.2 kg)  Radiology/Studies:  Dg Chest 2 View  Result Date: 02/20/2017 CLINICAL DATA:  Shortness of breath. EXAM: CHEST  2 VIEW COMPARISON:  02/25/2016. FINDINGS: AICD in stable position. A catheter is noted overlying the right mid chest. This could represent a catheter overlying the chest. Prior CABG. Stable cardiomegaly. Low lung volumes. No focal infiltrate. No pleural effusion or pneumothorax. Old right rib fractures. IMPRESSION:  1. AICD in stable position. Prior CABG. Cardiomegaly. No pulmonary venous congestion. 2.  Low lung volumes. Electronically Signed   By: Marcello Moores  Register   On: 02/20/2017 07:33     Assessment and Recommendation  77 y.o. male with known coronary artery disease status post old myocardial infarction and severe cardiomyopathy with sleep apnea and acute on chronic renal failure and systolic dysfunction congestive heart failure with hyponatremia and no current evidence of myocardial infarction 1.  Continue to abstain from intravenous diuretics due to acute on chronic renal failure and hyponatremia 2.  Consideration of consultation with nephrology for further assessment and treatment of possible cardiorenal failure 3.  Continue current medication management including amiodarone for history of ventricular tachycardia currently stable 4.  No further cardiac diagnostics necessary at this time 5.  For potential improvements and need for adjustments of medications next line 6.  No further intervention of minimal elevation of with demand ischemia  Signed, Serafina Royals M.D. FACC

## 2017-02-22 ENCOUNTER — Inpatient Hospital Stay: Payer: Medicare Other

## 2017-02-22 LAB — BASIC METABOLIC PANEL
Anion gap: 12 (ref 5–15)
BUN: 58 mg/dL — AB (ref 6–20)
CHLORIDE: 101 mmol/L (ref 101–111)
CO2: 17 mmol/L — AB (ref 22–32)
CREATININE: 1.65 mg/dL — AB (ref 0.61–1.24)
Calcium: 7.8 mg/dL — ABNORMAL LOW (ref 8.9–10.3)
GFR calc Af Amer: 45 mL/min — ABNORMAL LOW (ref >60)
GFR calc non Af Amer: 38 mL/min — ABNORMAL LOW (ref >60)
Glucose, Bld: 85 mg/dL (ref 65–99)
POTASSIUM: 3.7 mmol/L (ref 3.5–5.1)
Sodium: 130 mmol/L — ABNORMAL LOW (ref 135–145)

## 2017-02-22 LAB — GLUCOSE, CAPILLARY: Glucose-Capillary: 80 mg/dL (ref 65–99)

## 2017-02-22 MED ORDER — NYSTATIN 100000 UNIT/ML MT SUSP: 5.0000 mL | Freq: Four times a day (QID) | OROMUCOSAL | 0 refills | Status: DC

## 2017-02-22 MED ORDER — PREDNISONE 20 MG PO TABS: 50.0000 mg | ORAL_TABLET | Freq: Every day | ORAL | Status: DC

## 2017-02-22 NOTE — Progress Notes (Signed)
Pt to be discharged today. Pt walking in room with walker. Bathed sef in b.r. Iv and tele removed. disch instruction and prescrip given to pt and wife. Discharged via w.c.

## 2017-02-22 NOTE — Discharge Instructions (Signed)
Recommend checking BMP in follow up appointment Recommend not taking Bactrim in the future   Acute Kidney Injury, Adult Acute kidney injury is a sudden worsening of kidney function. The kidneys are organs that have several jobs. They filter the blood to remove waste products and extra fluid. They also maintain a healthy balance of minerals and hormones in the body, which helps control blood pressure and keep bones strong. With this condition, your kidneys do not do their jobs as well as they should. This condition ranges from mild to severe. Over time it may develop into long-lasting (chronic) kidney disease. Early detection and treatment may prevent acute kidney injury from developing into a chronic condition. What are the causes? Common causes of this condition include:  A problem with blood flow to the kidneys. This may be caused by: ? Low blood pressure (hypotension) or shock. ? Blood loss. ? Heart and blood vessel (cardiovascular) disease. ? Severe burns. ? Liver disease.  Direct damage to the kidneys. This may be caused by: ? Certain medicines. ? A kidney infection. ? Poisoning. ? Being around or in contact with toxic substances. ? A surgical wound. ? A hard, direct hit to the kidney area.  A sudden blockage of urine flow. This may be caused by: ? Cancer. ? Kidney stones. ? An enlarged prostate in males.  What are the signs or symptoms? Symptoms of this condition may not be obvious until the condition becomes severe. Symptoms of this condition can include:  Tiredness (lethargy), or difficulty staying awake.  Nausea or vomiting.  Swelling (edema) of the face, legs, ankles, or feet.  Problems with urination, such as: ? Abdominal pain, or pain along the side of your stomach (flank). ? Decreased urine production. ? Decrease in the force of urine flow.  Muscle twitches and cramps, especially in the legs.  Confusion or trouble concentrating.  Loss of  appetite.  Fever.  How is this diagnosed? This condition may be diagnosed with tests, including:  Blood tests.  Urine tests.  Imaging tests.  A test in which a sample of tissue is removed from the kidneys to be examined under a microscope (kidney biopsy).  How is this treated? Treatment for this condition depends on the cause and how severe the condition is. In mild cases, treatment may not be needed. The kidneys may heal on their own. In more severe cases, treatment will involve:  Treating the cause of the kidney injury. This may involve changing any medicines you are taking or adjusting your dosage.  Fluids. You may need specialized IV fluids to balance your body's needs.  Having a catheter placed to drain urine and prevent blockages.  Preventing problems from occurring. This may mean avoiding certain medicines or procedures that can cause further injury to the kidneys.  In some cases treatment may also require:  A procedure to remove toxic wastes from the body (dialysis or continuous renal replacement therapy - CRRT).  Surgery. This may be done to repair a torn kidney, or to remove the blockage from the urinary system.  Follow these instructions at home: Medicines  Take over-the-counter and prescription medicines only as told by your health care provider.  Do not take any new medicines without your health care provider's approval. Many medicines can worsen your kidney damage.  Do not take any vitamin and mineral supplements without your health care provider's approval. Many nutritional supplements can worsen your kidney damage. Lifestyle  If your health care provider prescribed changes to your  diet, follow them. You may need to decrease the amount of protein you eat.  Achieve and maintain a healthy weight. If you need help with this, ask your health care provider.  Start or continue an exercise plan. Try to exercise at least 30 minutes a day, 5 days a week.  Do not  use any tobacco products, such as cigarettes, chewing tobacco, and e-cigarettes. If you need help quitting, ask your health care provider. General instructions  Keep track of your blood pressure. Report changes in your blood pressure as told by your health care provider.  Stay up to date with immunizations. Ask your health care provider which immunizations you need.  Keep all follow-up visits as told by your health care provider. This is important. Where to find more information:  American Association of Kidney Patients: BombTimer.gl  National Kidney Foundation: www.kidney.[place]: https://mathis.com/  Life Options [facility]: ? www.lifeoptions.org ? www.kidneyschool.org Contact a health care provider if:  Your symptoms get worse.  You develop new symptoms. Get help right away if:  You develop symptoms of worsening kidney disease, which include: ? Headaches. ? Abnormally dark or light skin. ? Easy bruising. ? Frequent hiccups. ? Chest pain. ? Shortness of breath. ? End of menstruation in women. ? Seizures. ? Confusion or altered mental status. ? Abdominal or back pain. ? Itchiness.  You have a fever.  Your body is producing less urine.  You have pain or bleeding when you urinate. Summary  Acute kidney injury is a sudden worsening of kidney function.  Acute kidney injury can be caused by problems with blood flow to the kidneys, direct damage to the kidneys, and sudden blockage of urine flow.  Symptoms of this condition may not be obvious until it becomes severe. Symptoms may include edema, lethargy, confusion, nausea or vomiting, and problems passing urine.  This condition can usually be diagnosed with blood tests, urine tests, and imaging tests. Sometimes a kidney biopsy is done to diagnose this condition.  Treatment for this condition often involves treating the underlying cause. It is treated with fluids, medicines, dialysis, diet  changes, or surgery. This information is not intended to replace advice given to you by your health care provider. Make sure you discuss any questions you have with your health care provider. Document Released: 07/08/2010 Document Revised: 04/24/2016 Document Reviewed: 12/14/2015 Elsevier Interactive Patient Education  [employer].

## 2017-02-22 NOTE — Discharge Summary (Signed)
[place] [facility] [place] NAME: [name]    MR#:  [id number]  [place]:  02-02-1940  DATE OF ADMISSION:  02/19/2017 ADMITTING PHYSICIAN: Amelia Jo, MD  DATE OF DISCHARGE: 02/22/2017  1:17 PM  PRIMARY CARE PHYSICIAN: Cyndi Bender, PA-C    ADMISSION DIAGNOSIS:  Hyponatremia [E87.1] Weakness [R53.1] Renal insufficiency [N28.9]  DISCHARGE DIAGNOSIS:  Active Problems:   Hyponatremia   SECONDARY DIAGNOSIS:   Past Medical History:  Diagnosis Date  . AICD (automatic cardioverter/defibrillator) present    [employer]  . Arthritis    RA  . Balance problem   . CHF (congestive heart failure) ([place])   . Collagen vascular disease ([place])    RA since 2014  . Coronary artery disease   . Deviated nasal septum   . Dysrhythmia   . Hearing loss    TINNITUS  . High cholesterol   . Leg weakness   . Myocardial infarction ([place])   . Presence of permanent cardiac pacemaker    WITH DEFIBRILATOR ([employer])  . Seasonal allergies    RHINITIS  . Shortness of breath dyspnea    COUGH  . Sinus headache   . Sinusitis    CHRONIC  . Sleep apnea   . Vertigo    DYSEQUILIBRIUM    HOSPITAL COURSE:   1.  Acute kidney injury on chronic kidney disease stage III.  I started gentle IV fluid hydration.  We held Lasix at this point secondary to acute kidney injury.  Bactrim is the likely culprit causing acute kidney injury.  Patient's appetite has improved.  Patient's creatinine had peaked at 2.24 and came down to 1.65.  Patient was seen in consultation by nephrology and they will follow-up as outpatient.  I do recommend checking a BMP as outpatient.  I also recommended not giving this patient Bactrim in the future. 2.  Hyponatremia.  Bactrim is also the likely culprit for this.  The patient was given gentle IV fluid hydration.  Sodium was as low as 124 and came up to 130. 3.  Hyperkalemia.  Bactrim is also the likely culprit with this  also.  Also with a acute kidney injury can worsen this.  But patient was treated with Kayexalate with good response. 4.  History of ventricular tachycardia on amiodarone and mexiletine. 5.  Hypothyroidism unspecified on levothyroxine 6.  Temporal arteritis on large dose of prednisone.  Hopefully can taper this as quickly as possible in the future. 7.  Weakness.  Physical therapy recommended home with home health.  Patient has a walker at home. 8.  History of systolic congestive heart failure.  No signs even with giving IV fluid hydration.  Can go back on Lasix as needed as outpatient. 9.  Oral thrush on nystatin swish and swallow.  Likely secondary to prednisone    DISCHARGE CONDITIONS:   Satisfactory  CONSULTS OBTAINED:  Treatment Team:  Corey Skains, MD Anthonette Legato, MD  DRUG ALLERGIES:   Allergies  Allergen Reactions  . Pravastatin Other (See Comments)    Reaction:  Joint pain     DISCHARGE MEDICATIONS:   Allergies as of 02/22/2017      Reactions   Pravastatin Other (See Comments)   Reaction:  Joint pain       Medication List    STOP taking these medications   cefUROXime 500 MG tablet Commonly known as:  CEFTIN   metoprolol succinate 50 MG 24 hr tablet Commonly known as:  TOPROL-XL   spironolactone 25 MG tablet Commonly known as:  ALDACTONE     TAKE these medications   acetaminophen 325 MG tablet Commonly known as:  TYLENOL Take 2 tablets (650 mg total) by mouth every 6 (six) hours as needed for mild pain (or Fever >/= 101).   amiodarone 400 MG tablet Commonly known as:  PACERONE Take 300 mg by mouth daily.   aspirin EC 81 MG tablet Take 81 mg by mouth daily.   cetirizine 10 MG tablet Commonly known as:  ZYRTEC Take 10 mg by mouth daily.   docusate sodium 100 MG capsule Commonly known as:  COLACE Take 100 mg by mouth every evening.   fluticasone 50 MCG/ACT nasal spray Commonly known as:  FLONASE Place 1 spray into both nostrils daily as  needed for rhinitis.   furosemide 40 MG tablet Commonly known as:  LASIX Take 40 mg by mouth daily as needed for edema.   leflunomide 20 MG tablet Commonly known as:  ARAVA Take 20 mg by mouth every evening.   levothyroxine 50 MCG tablet Commonly known as:  SYNTHROID, LEVOTHROID Take 1 tablet by mouth every morning.   mexiletine 150 MG capsule Commonly known as:  MEXITIL Take 150 mg by mouth 3 (three) times daily.   nitroGLYCERIN 0.4 MG SL tablet Commonly known as:  NITROSTAT Place 0.4 mg under the tongue every 5 (five) minutes as needed for chest pain.   nystatin 100000 UNIT/ML suspension Commonly known as:  MYCOSTATIN Take 5 mLs (500,000 Units total) by mouth 4 (four) times daily.   predniSONE 20 MG tablet Commonly known as:  DELTASONE Take 2.5 tablets (50 mg total) by mouth daily with breakfast. What changed:  how much to take   traMADol 50 MG tablet Commonly known as:  ULTRAM Take 1 tablet by mouth 3 (three) times daily as needed.   vitamin B-12 1000 MCG tablet Commonly known as:  CYANOCOBALAMIN Take 1,000 mcg by mouth daily.   Vitamin D3 2000 units Tabs Take 2,000 Units by mouth daily.        DISCHARGE INSTRUCTIONS:    Follow-up cardiology 1 week, follow-up PMD 1 week.  Nephrology will call him for an appointment  If you experience worsening of your admission symptoms, develop shortness of breath, life threatening emergency, suicidal or homicidal thoughts you must seek medical attention immediately by calling 911 or calling your MD immediately  if symptoms less severe.  You Must read complete instructions/literature along with all the possible adverse reactions/side effects for all the Medicines you take and that have been prescribed to you. Take any new Medicines after you have completely understood and accept all the possible adverse reactions/side effects.   Please note  You were cared for by a hospitalist during your hospital stay. If you have any  questions about your discharge medications or the care you received while you were in the hospital after you are discharged, you can call the unit and asked to speak with the hospitalist on call if the hospitalist that took care of you is not available. Once you are discharged, your primary care physician will handle any further medical issues. Please note that NO REFILLS for any discharge medications will be authorized once you are discharged, as it is imperative that you return to your primary care physician (or establish a relationship with a primary care physician if you do not have one) for your aftercare needs so that they can reassess your need for medications and monitor  your lab values.    Today   CHIEF COMPLAINT:   Chief Complaint  Patient presents with  . Weakness    HISTORY OF PRESENT ILLNESS:  [name]  is a 77 y.o. male came in for weakness and found to have acute kidney injury, hyponatremia and hyperkalemia   VITAL SIGNS:  Blood pressure 123/63, pulse 76, temperature 97.6 F (36.4 C), temperature source Oral, resp. rate 16, height 5\' 8"  (1.727 m), weight 86.2 kg (190 lb), SpO2 92 %.    PHYSICAL EXAMINATION:  GENERAL:  77 y.o.-year-old patient lying in the bed with no acute distress.  EYES: Pupils equal, round, reactive to light and accommodation. No scleral icterus. Extraocular muscles intact.  HEENT: Head atraumatic, normocephalic. Oropharynx and nasopharynx clear.  NECK:  Supple, no jugular venous distention. No thyroid enlargement, no tenderness.  LUNGS: Normal breath sounds bilaterally, no wheezing, rales,rhonchi or crepitation. No use of accessory muscles of respiration.  CARDIOVASCULAR: S1, S2 normal. No murmurs, rubs, or gallops.  ABDOMEN: Soft, non-tender, non-distended. Bowel sounds present. No organomegaly or mass.  EXTREMITIES: No pedal edema, cyanosis, or clubbing.  NEUROLOGIC: Cranial nerves II through XII are intact. Muscle strength 5/5 in all  extremities. Sensation intact. Gait not checked.  PSYCHIATRIC: The patient is alert and oriented x 3.  SKIN: Right lower extremity with scab seen.  No signs of ongoing infection.  DATA REVIEW:   CBC Recent Labs  Lab 02/20/17 0622  WBC 11.9*  HGB 10.8*  HCT 33.3*  PLT 183    Chemistries  Recent Labs  Lab 02/22/17 0456  NA 130*  K 3.7  CL 101  CO2 17*  GLUCOSE 85  BUN 58*  CREATININE 1.65*  CALCIUM 7.8*    Cardiac Enzymes Recent Labs  Lab 02/19/17 1758  TROPONINI 0.03*      RADIOLOGY:  US Renal  Result Date: 02/22/2017 CLINICAL DATA:  Acute renal failure. Chronic kidney disease stage 3. EXAM: RENAL / URINARY TRACT ULTRASOUND COMPLETE COMPARISON:  CT 01/10/2014 FINDINGS: Right Kidney: Length: 9.6 cm. Echogenicity within normal limits. No mass or hydronephrosis visualized. Left Kidney: Length: 11.6 cm. Echogenicity within normal limits. No mass or hydronephrosis visualized. Bladder: Appears normal for degree of bladder distention. IMPRESSION: Normal renal ultrasound. Electronically Signed   By: Marin Olp M.D.   On: 02/22/2017 09:58       Management plans discussed with the patient, family and they are in agreement.  CODE STATUS:     Code Status Orders  (From admission, onward)        Start     Ordered   02/20/17 0042  Do not attempt resuscitation (DNR)  Continuous    Question Answer Comment  In the event of cardiac or respiratory ARREST Do not call a "code blue"   In the event of cardiac or respiratory ARREST Do not perform Intubation, CPR, defibrillation or ACLS   In the event of cardiac or respiratory ARREST Use medication by any route, position, wound care, and other measures to relive pain and suffering. May use oxygen, suction and manual treatment of airway obstruction as needed for comfort.   Comments Nurse may pronounce      02/20/17 0041    Code Status History    Date Active Date Inactive Code Status Order ID Comments User Context    12/02/2016 02:25 12/02/2016 18:36 DNR [id number]  Saundra Shelling, MD ED   08/18/2014 22:15 08/20/2014 14:38 DNR [id number]  Loletha Grayer, MD ED   07/15/2014 03:26 07/18/2014  17:28 Full Code [id number]  [name], MD ED    Advance Directive Documentation     Most Recent Value  Type of Advance Directive  Living will  Pre-existing out of facility DNR order (yellow form or pink MOST form)  No data  "MOST" Form in Place?  No data      TOTAL TIME TAKING CARE OF THIS PATIENT: 35 minutes.    Loletha Grayer M.D on 02/22/2017 at 3:32 PM  Between 7am to 6pm - Pager - [phone]  After 6pm go to [email or web address] - Proofreader  [employer] Office  [phone]  CC: Primary care physician; Cyndi Bender, PA-C

## 2017-02-22 NOTE — Progress Notes (Signed)
[facility] Encounter Note  Patient: [name] / Admit Date: 02/19/2017 / Date of Encounter: 02/22/2017, 7:09 AM   Subjective: Patient has had some improvements overnight with minimal confusion but significantly improved.  Patient remains with hyponatremia but significant improvements in duration and discontinuation of Bactrim.  Patient has not had any significant heart failure type symptoms and exam is improved despite elevated BNP and significant acute on chronic renal failure.  Heart rate better controlled at this time on appropriate medication management.  No evidence of significant ventricular tachycardia.  No evidence of myocardial infarction with this admission with a troponin of 0.03 consistent with demand ischemia  Review of Systems: Positive for: Shortness of breath Negative for: Vision change, hearing change, syncope, dizziness, nausea, vomiting,diarrhea, bloody stool, stomach pain, cough, congestion, diaphoresis, urinary frequency, urinary pain,skin lesions, skin rashes Others previously listed  Objective: Telemetry: Normal sinus rhythm Physical Exam: Blood pressure 120/63, pulse 76, temperature 98.3 F (36.8 C), temperature source Oral, resp. rate 18, height 5\' 8"  (1.727 m), weight 190 lb (86.2 kg), SpO2 91 %. Body mass index is 28.89 kg/m. General: Well developed, well nourished, in no acute distress. Head: Normocephalic, atraumatic, sclera non-icteric, no xanthomas, nares are without discharge. Neck: No apparent masses Lungs: Normal respirations with no wheezes, no rhonchi, no rales , no crackles   Heart: Regular rate and rhythm, normal S1 S2, no murmur, no rub, no gallop, PMI is normal size and placement, carotid upstroke normal without bruit, jugular venous pressure normal Abdomen: Soft, non-tender, non-distended with normoactive bowel sounds. No hepatosplenomegaly. Abdominal aorta is normal size without bruit Extremities: Trace edema, no  clubbing, no cyanosis, no ulcers,  Peripheral: 2+ radial, 2+ femoral, 2+ dorsal pedal pulses Neuro: Not alert and oriented. Moves all extremities spontaneously. Psych: Does not responds to questions appropriately with a normal affect.   Intake/Output Summary (Last 24 hours) at 02/22/2017 0709 Last data filed at 02/22/2017 0348 Gross per 24 hour  Intake 240 ml  Output 600 ml  Net -360 ml    Inpatient Medications:  . amiodarone  300 mg Oral Daily  . aspirin EC  81 mg Oral Daily  . cholecalciferol  2,000 Units Oral Daily  . docusate sodium  100 mg Oral BID  . heparin  5,000 Units Subcutaneous Q8H  . leflunomide  20 mg Oral QPM  . levothyroxine  50 mcg Oral QAC breakfast  . loratadine  10 mg Oral Daily  . mexiletine  150 mg Oral TID  . nystatin  5 mL Oral QID  . predniSONE  50 mg Oral Q breakfast  . vitamin B-12  1,000 mcg Oral Daily   Infusions:  . sodium chloride 50 mL/hr at 02/22/17 0540    Labs: Recent Labs    02/21/17 0453 02/21/17 1359 02/22/17 0456  NA 127*  --  130*  K 5.9* 4.4 3.7  CL 95*  --  101  CO2 14*  --  17*  GLUCOSE 123*  --  85  BUN 65*  --  58*  CREATININE 2.24*  --  1.65*  CALCIUM 7.8*  --  7.8*   No results for input(s): AST, ALT, ALKPHOS, BILITOT, PROT, ALBUMIN in the last 72 hours. Recent Labs    02/19/17 1758 02/20/17 0622  WBC 11.2* 11.9*  HGB 11.1* 10.8*  HCT 34.4* 33.3*  MCV 98.4 97.3  PLT 226 183   Recent Labs    02/19/17 1758  TROPONINI 0.03*   Invalid input(s): POCBNP No  results for input(s): HGBA1C in the last 72 hours.   Weights: Filed Weights   02/19/17 1750 02/20/17 0100 02/22/17 0348  Weight: 183 lb (83 kg) 192 lb 3.2 oz (87.2 kg) 190 lb (86.2 kg)     Radiology/Studies:  Dg Chest 2 View  Result Date: 02/20/2017 CLINICAL DATA:  Shortness of breath. EXAM: CHEST  2 VIEW COMPARISON:  02/25/2016. FINDINGS: AICD in stable position. A catheter is noted overlying the right mid chest. This could represent a catheter  overlying the chest. Prior CABG. Stable cardiomegaly. Low lung volumes. No focal infiltrate. No pleural effusion or pneumothorax. Old right rib fractures. IMPRESSION: 1. AICD in stable position. Prior CABG. Cardiomegaly. No pulmonary venous congestion. 2.  Low lung volumes. Electronically Signed   By: Marcello Moores  Register   On: 02/20/2017 07:33     Assessment and Recommendation  77 y.o. male with known coronary artery disease status post old myocardial infarction and severe cardiomyopathy with sleep apnea and acute on chronic renal failure and systolic dysfunction congestive heart failure with hyponatremia and no current evidence of myocardial infarction 1.  Continue to abstain from intravenous diuretics due to acute on chronic renal failure and hyponatremia with recent mild hydration helping with his hyponatremia 2.  Continuation of treatment as per nephrology 3.  Continue current medication management including amiodarone for history of ventricular tachycardia currently stable 4.  No further cardiac diagnostics necessary at this time 5.  Again ambulation and follow for improvements and possible discharge home from cardiac standpoint to with follow-up next week as needed  Signed, Serafina Royals M.D. FACC

## 2017-02-22 NOTE — Care Management Note (Signed)
Case Management Note  Patient Details  Name: [name] MRN: [id number] Date of Birth: 08/29/1940  Subjective/Objective:       A resumption of care order for HH=PT, RN was called to [place] at [facility].              Action/Plan:   Expected Discharge Date:  02/22/17               Expected Discharge Plan:  [place]  In-House Referral:     Discharge planning Services  CM Consult  Post Acute Care Choice:  Home Health Choice offered to:  Patient  DME Arranged:    DME Agency:     HH Arranged:  RN, PT HH Agency:  [place]  Status of Service:  Completed, signed off  If discussed at [place] of Stay Meetings, dates discussed:    Additional Comments:  Rheannon Cerney A, RN 02/22/2017, 11:06 AM

## 2017-02-24 DIAGNOSIS — K8020 Calculus of gallbladder without cholecystitis without obstruction: Secondary | ICD-10-CM | POA: Diagnosis present

## 2017-02-24 DIAGNOSIS — R945 Abnormal results of liver function studies: Secondary | ICD-10-CM | POA: Diagnosis not present

## 2017-02-24 DIAGNOSIS — Z951 Presence of aortocoronary bypass graft: Secondary | ICD-10-CM | POA: Diagnosis not present

## 2017-02-24 DIAGNOSIS — M6281 Muscle weakness (generalized): Secondary | ICD-10-CM | POA: Diagnosis present

## 2017-02-24 DIAGNOSIS — G4730 Sleep apnea, unspecified: Secondary | ICD-10-CM | POA: Diagnosis present

## 2017-02-24 DIAGNOSIS — Z95810 Presence of automatic (implantable) cardiac defibrillator: Secondary | ICD-10-CM | POA: Diagnosis not present

## 2017-02-24 DIAGNOSIS — Z85828 Personal history of other malignant neoplasm of skin: Secondary | ICD-10-CM | POA: Diagnosis not present

## 2017-02-24 DIAGNOSIS — J811 Chronic pulmonary edema: Secondary | ICD-10-CM | POA: Diagnosis not present

## 2017-02-24 DIAGNOSIS — D638 Anemia in other chronic diseases classified elsewhere: Secondary | ICD-10-CM | POA: Diagnosis present

## 2017-02-24 DIAGNOSIS — M069 Rheumatoid arthritis, unspecified: Secondary | ICD-10-CM | POA: Diagnosis present

## 2017-02-24 DIAGNOSIS — I252 Old myocardial infarction: Secondary | ICD-10-CM | POA: Diagnosis not present

## 2017-02-24 DIAGNOSIS — Z79890 Hormone replacement therapy (postmenopausal): Secondary | ICD-10-CM | POA: Diagnosis not present

## 2017-02-24 DIAGNOSIS — E7800 Pure hypercholesterolemia, unspecified: Secondary | ICD-10-CM | POA: Diagnosis present

## 2017-02-24 DIAGNOSIS — I110 Hypertensive heart disease with heart failure: Secondary | ICD-10-CM | POA: Diagnosis present

## 2017-02-24 DIAGNOSIS — E785 Hyperlipidemia, unspecified: Secondary | ICD-10-CM | POA: Diagnosis present

## 2017-02-24 DIAGNOSIS — I5023 Acute on chronic systolic (congestive) heart failure: Secondary | ICD-10-CM | POA: Diagnosis present

## 2017-02-24 DIAGNOSIS — R918 Other nonspecific abnormal finding of lung field: Secondary | ICD-10-CM | POA: Diagnosis not present

## 2017-02-24 DIAGNOSIS — I255 Ischemic cardiomyopathy: Secondary | ICD-10-CM | POA: Diagnosis present

## 2017-02-24 DIAGNOSIS — I2510 Atherosclerotic heart disease of native coronary artery without angina pectoris: Secondary | ICD-10-CM | POA: Diagnosis present

## 2017-02-24 DIAGNOSIS — E039 Hypothyroidism, unspecified: Secondary | ICD-10-CM | POA: Diagnosis present

## 2017-02-24 DIAGNOSIS — J329 Chronic sinusitis, unspecified: Secondary | ICD-10-CM | POA: Diagnosis present

## 2017-02-24 DIAGNOSIS — Z7952 Long term (current) use of systemic steroids: Secondary | ICD-10-CM | POA: Diagnosis not present

## 2017-02-24 DIAGNOSIS — R7989 Other specified abnormal findings of blood chemistry: Secondary | ICD-10-CM | POA: Diagnosis not present

## 2017-02-24 DIAGNOSIS — M316 Other giant cell arteritis: Secondary | ICD-10-CM | POA: Diagnosis not present

## 2017-02-24 DIAGNOSIS — K761 Chronic passive congestion of liver: Secondary | ICD-10-CM | POA: Diagnosis present

## 2017-02-24 DIAGNOSIS — J90 Pleural effusion, not elsewhere classified: Secondary | ICD-10-CM | POA: Diagnosis not present

## 2017-02-24 DIAGNOSIS — R531 Weakness: Secondary | ICD-10-CM | POA: Diagnosis not present

## 2017-02-24 DIAGNOSIS — Z7982 Long term (current) use of aspirin: Secondary | ICD-10-CM | POA: Diagnosis not present

## 2017-02-24 DIAGNOSIS — I517 Cardiomegaly: Secondary | ICD-10-CM | POA: Diagnosis not present

## 2017-02-24 LAB — PROTEIN ELECTROPHORESIS, SERUM
A/G Ratio: 1.1 (ref 0.7–1.7)
ALBUMIN ELP: 3.1 g/dL (ref 2.9–4.4)
ALPHA-1-GLOBULIN: 0.3 g/dL (ref 0.0–0.4)
ALPHA-2-GLOBULIN: 1.2 g/dL — AB (ref 0.4–1.0)
Beta Globulin: 0.7 g/dL (ref 0.7–1.3)
GLOBULIN, TOTAL: 2.7 g/dL (ref 2.2–3.9)
Gamma Globulin: 0.4 g/dL (ref 0.4–1.8)
TOTAL PROTEIN ELP: 5.8 g/dL — AB (ref 6.0–8.5)

## 2017-02-24 LAB — PROTEIN ELECTRO, RANDOM URINE
ALBUMIN ELP UR: 20.4 %
ALPHA-1-GLOBULIN, U: 4.4 %
ALPHA-2-GLOBULIN, U: 30.2 %
BETA GLOBULIN, U: 34.7 %
GAMMA GLOBULIN, U: 10.3 %
TOTAL PROTEIN, URINE-UPE24: 28.3 mg/dL

## 2017-02-26 ENCOUNTER — Telehealth: Payer: Self-pay

## 2017-02-26 NOTE — Telephone Encounter (Signed)
Flagged on EMMI report for not having a follow up appointment scheduled.  Attempted to call patient at [phone], though was informed that patient was in the hospital at [facility].

## 2017-03-03 DIAGNOSIS — R748 Abnormal levels of other serum enzymes: Secondary | ICD-10-CM | POA: Diagnosis not present

## 2017-03-03 DIAGNOSIS — N179 Acute kidney failure, unspecified: Secondary | ICD-10-CM | POA: Diagnosis not present

## 2017-03-03 DIAGNOSIS — E038 Other specified hypothyroidism: Secondary | ICD-10-CM | POA: Diagnosis not present

## 2017-03-03 DIAGNOSIS — I509 Heart failure, unspecified: Secondary | ICD-10-CM | POA: Diagnosis not present

## 2017-03-03 DIAGNOSIS — Z79899 Other long term (current) drug therapy: Secondary | ICD-10-CM | POA: Diagnosis not present

## 2017-03-03 DIAGNOSIS — Z1331 Encounter for screening for depression: Secondary | ICD-10-CM | POA: Diagnosis not present

## 2017-03-04 DIAGNOSIS — M069 Rheumatoid arthritis, unspecified: Secondary | ICD-10-CM | POA: Diagnosis not present

## 2017-03-04 DIAGNOSIS — R531 Weakness: Secondary | ICD-10-CM | POA: Diagnosis not present

## 2017-03-04 DIAGNOSIS — R932 Abnormal findings on diagnostic imaging of liver and biliary tract: Secondary | ICD-10-CM | POA: Diagnosis not present

## 2017-03-04 DIAGNOSIS — G44321 Chronic post-traumatic headache, intractable: Secondary | ICD-10-CM | POA: Diagnosis not present

## 2017-03-04 DIAGNOSIS — R5383 Other fatigue: Secondary | ICD-10-CM | POA: Diagnosis not present

## 2017-03-04 DIAGNOSIS — Z4502 Encounter for adjustment and management of automatic implantable cardiac defibrillator: Secondary | ICD-10-CM | POA: Diagnosis not present

## 2017-03-04 DIAGNOSIS — R740 Nonspecific elevation of levels of transaminase and lactic acid dehydrogenase [LDH]: Secondary | ICD-10-CM | POA: Diagnosis not present

## 2017-03-05 DIAGNOSIS — M545 Low back pain: Secondary | ICD-10-CM | POA: Diagnosis not present

## 2017-03-05 DIAGNOSIS — I5022 Chronic systolic (congestive) heart failure: Secondary | ICD-10-CM | POA: Diagnosis not present

## 2017-03-05 DIAGNOSIS — I2510 Atherosclerotic heart disease of native coronary artery without angina pectoris: Secondary | ICD-10-CM | POA: Diagnosis not present

## 2017-03-05 DIAGNOSIS — I110 Hypertensive heart disease with heart failure: Secondary | ICD-10-CM | POA: Diagnosis not present

## 2017-03-05 DIAGNOSIS — M069 Rheumatoid arthritis, unspecified: Secondary | ICD-10-CM | POA: Diagnosis not present

## 2017-03-05 DIAGNOSIS — I255 Ischemic cardiomyopathy: Secondary | ICD-10-CM | POA: Diagnosis not present

## 2017-03-06 DIAGNOSIS — E039 Hypothyroidism, unspecified: Secondary | ICD-10-CM | POA: Diagnosis not present

## 2017-03-06 DIAGNOSIS — M6281 Muscle weakness (generalized): Secondary | ICD-10-CM | POA: Diagnosis not present

## 2017-03-06 DIAGNOSIS — I5023 Acute on chronic systolic (congestive) heart failure: Secondary | ICD-10-CM | POA: Diagnosis not present

## 2017-03-06 DIAGNOSIS — G4730 Sleep apnea, unspecified: Secondary | ICD-10-CM | POA: Diagnosis not present

## 2017-03-06 DIAGNOSIS — R899 Unspecified abnormal finding in specimens from other organs, systems and tissues: Secondary | ICD-10-CM | POA: Diagnosis not present

## 2017-03-06 DIAGNOSIS — M316 Other giant cell arteritis: Secondary | ICD-10-CM | POA: Diagnosis not present

## 2017-03-06 DIAGNOSIS — M5481 Occipital neuralgia: Secondary | ICD-10-CM | POA: Diagnosis not present

## 2017-03-06 DIAGNOSIS — M069 Rheumatoid arthritis, unspecified: Secondary | ICD-10-CM | POA: Diagnosis not present

## 2017-03-06 DIAGNOSIS — I255 Ischemic cardiomyopathy: Secondary | ICD-10-CM | POA: Diagnosis not present

## 2017-03-06 DIAGNOSIS — M545 Low back pain: Secondary | ICD-10-CM | POA: Diagnosis not present

## 2017-03-06 DIAGNOSIS — G43719 Chronic migraine without aura, intractable, without status migrainosus: Secondary | ICD-10-CM | POA: Diagnosis not present

## 2017-03-06 DIAGNOSIS — I2510 Atherosclerotic heart disease of native coronary artery without angina pectoris: Secondary | ICD-10-CM | POA: Diagnosis not present

## 2017-03-06 DIAGNOSIS — I110 Hypertensive heart disease with heart failure: Secondary | ICD-10-CM | POA: Diagnosis not present

## 2017-03-06 DIAGNOSIS — M7918 Myalgia, other site: Secondary | ICD-10-CM | POA: Diagnosis not present

## 2017-03-09 DIAGNOSIS — M6281 Muscle weakness (generalized): Secondary | ICD-10-CM | POA: Diagnosis not present

## 2017-03-09 DIAGNOSIS — M069 Rheumatoid arthritis, unspecified: Secondary | ICD-10-CM | POA: Diagnosis not present

## 2017-03-09 DIAGNOSIS — I2510 Atherosclerotic heart disease of native coronary artery without angina pectoris: Secondary | ICD-10-CM | POA: Diagnosis not present

## 2017-03-09 DIAGNOSIS — I5023 Acute on chronic systolic (congestive) heart failure: Secondary | ICD-10-CM | POA: Diagnosis not present

## 2017-03-09 DIAGNOSIS — I110 Hypertensive heart disease with heart failure: Secondary | ICD-10-CM | POA: Diagnosis not present

## 2017-03-09 DIAGNOSIS — I255 Ischemic cardiomyopathy: Secondary | ICD-10-CM | POA: Diagnosis not present

## 2017-03-10 DIAGNOSIS — I5022 Chronic systolic (congestive) heart failure: Secondary | ICD-10-CM | POA: Diagnosis not present

## 2017-03-10 DIAGNOSIS — I5023 Acute on chronic systolic (congestive) heart failure: Secondary | ICD-10-CM | POA: Diagnosis not present

## 2017-03-10 DIAGNOSIS — I2510 Atherosclerotic heart disease of native coronary artery without angina pectoris: Secondary | ICD-10-CM | POA: Diagnosis not present

## 2017-03-10 DIAGNOSIS — I214 Non-ST elevation (NSTEMI) myocardial infarction: Secondary | ICD-10-CM | POA: Diagnosis not present

## 2017-03-10 DIAGNOSIS — I255 Ischemic cardiomyopathy: Secondary | ICD-10-CM | POA: Diagnosis not present

## 2017-03-10 DIAGNOSIS — I472 Ventricular tachycardia: Secondary | ICD-10-CM | POA: Diagnosis not present

## 2017-03-10 DIAGNOSIS — I10 Essential (primary) hypertension: Secondary | ICD-10-CM | POA: Diagnosis not present

## 2017-03-11 DIAGNOSIS — R21 Rash and other nonspecific skin eruption: Secondary | ICD-10-CM | POA: Diagnosis not present

## 2017-03-11 DIAGNOSIS — L905 Scar conditions and fibrosis of skin: Secondary | ICD-10-CM | POA: Diagnosis not present

## 2017-03-11 DIAGNOSIS — L03115 Cellulitis of right lower limb: Secondary | ICD-10-CM | POA: Diagnosis not present

## 2017-03-11 DIAGNOSIS — R234 Changes in skin texture: Secondary | ICD-10-CM | POA: Diagnosis not present

## 2017-03-11 DIAGNOSIS — L97819 Non-pressure chronic ulcer of other part of right lower leg with unspecified severity: Secondary | ICD-10-CM | POA: Diagnosis not present

## 2017-03-12 DIAGNOSIS — I2510 Atherosclerotic heart disease of native coronary artery without angina pectoris: Secondary | ICD-10-CM | POA: Diagnosis not present

## 2017-03-12 DIAGNOSIS — M6281 Muscle weakness (generalized): Secondary | ICD-10-CM | POA: Diagnosis not present

## 2017-03-12 DIAGNOSIS — M069 Rheumatoid arthritis, unspecified: Secondary | ICD-10-CM | POA: Diagnosis not present

## 2017-03-12 DIAGNOSIS — I5023 Acute on chronic systolic (congestive) heart failure: Secondary | ICD-10-CM | POA: Diagnosis not present

## 2017-03-12 DIAGNOSIS — I110 Hypertensive heart disease with heart failure: Secondary | ICD-10-CM | POA: Diagnosis not present

## 2017-03-12 DIAGNOSIS — I255 Ischemic cardiomyopathy: Secondary | ICD-10-CM | POA: Diagnosis not present

## 2017-03-13 ENCOUNTER — Other Ambulatory Visit (HOSPITAL_COMMUNITY): Payer: Self-pay | Admitting: Neurology

## 2017-03-13 ENCOUNTER — Other Ambulatory Visit: Payer: Self-pay | Admitting: Neurology

## 2017-03-13 DIAGNOSIS — M316 Other giant cell arteritis: Secondary | ICD-10-CM

## 2017-03-13 DIAGNOSIS — R29898 Other symptoms and signs involving the musculoskeletal system: Secondary | ICD-10-CM

## 2017-03-16 ENCOUNTER — Ambulatory Visit
Admission: RE | Admit: 2017-03-16 | Discharge: 2017-03-16 | Disposition: A | Discharge status: Home / Self Care | Payer: Medicare Other | Source: Ambulatory Visit | Attending: Internal Medicine | Admitting: Internal Medicine

## 2017-03-16 DIAGNOSIS — Z95810 Presence of automatic (implantable) cardiac defibrillator: Secondary | ICD-10-CM | POA: Diagnosis not present

## 2017-03-16 DIAGNOSIS — J90 Pleural effusion, not elsewhere classified: Secondary | ICD-10-CM | POA: Insufficient documentation

## 2017-03-16 DIAGNOSIS — R700 Elevated erythrocyte sedimentation rate: Secondary | ICD-10-CM

## 2017-03-16 DIAGNOSIS — K5730 Diverticulosis of large intestine without perforation or abscess without bleeding: Secondary | ICD-10-CM | POA: Insufficient documentation

## 2017-03-16 DIAGNOSIS — I517 Cardiomegaly: Secondary | ICD-10-CM | POA: Diagnosis not present

## 2017-03-16 DIAGNOSIS — I2510 Atherosclerotic heart disease of native coronary artery without angina pectoris: Secondary | ICD-10-CM | POA: Diagnosis not present

## 2017-03-16 DIAGNOSIS — R740 Nonspecific elevation of levels of transaminase and lactic acid dehydrogenase [LDH]: Secondary | ICD-10-CM | POA: Diagnosis not present

## 2017-03-16 DIAGNOSIS — R1013 Epigastric pain: Secondary | ICD-10-CM

## 2017-03-16 DIAGNOSIS — I5023 Acute on chronic systolic (congestive) heart failure: Secondary | ICD-10-CM | POA: Diagnosis not present

## 2017-03-16 DIAGNOSIS — M6281 Muscle weakness (generalized): Secondary | ICD-10-CM | POA: Diagnosis not present

## 2017-03-16 DIAGNOSIS — R531 Weakness: Secondary | ICD-10-CM | POA: Diagnosis not present

## 2017-03-16 DIAGNOSIS — D638 Anemia in other chronic diseases classified elsewhere: Secondary | ICD-10-CM

## 2017-03-16 DIAGNOSIS — I110 Hypertensive heart disease with heart failure: Secondary | ICD-10-CM | POA: Diagnosis not present

## 2017-03-16 DIAGNOSIS — R5383 Other fatigue: Secondary | ICD-10-CM | POA: Insufficient documentation

## 2017-03-16 DIAGNOSIS — R932 Abnormal findings on diagnostic imaging of liver and biliary tract: Secondary | ICD-10-CM | POA: Diagnosis present

## 2017-03-16 DIAGNOSIS — K8020 Calculus of gallbladder without cholecystitis without obstruction: Secondary | ICD-10-CM | POA: Diagnosis not present

## 2017-03-16 DIAGNOSIS — M069 Rheumatoid arthritis, unspecified: Secondary | ICD-10-CM | POA: Diagnosis not present

## 2017-03-16 DIAGNOSIS — I255 Ischemic cardiomyopathy: Secondary | ICD-10-CM | POA: Diagnosis not present

## 2017-03-17 ENCOUNTER — Other Ambulatory Visit: Payer: Self-pay | Admitting: Radiology

## 2017-03-17 DIAGNOSIS — I2510 Atherosclerotic heart disease of native coronary artery without angina pectoris: Secondary | ICD-10-CM | POA: Diagnosis not present

## 2017-03-17 DIAGNOSIS — M069 Rheumatoid arthritis, unspecified: Secondary | ICD-10-CM | POA: Diagnosis not present

## 2017-03-17 DIAGNOSIS — M6281 Muscle weakness (generalized): Secondary | ICD-10-CM | POA: Diagnosis not present

## 2017-03-17 DIAGNOSIS — I110 Hypertensive heart disease with heart failure: Secondary | ICD-10-CM | POA: Diagnosis not present

## 2017-03-17 DIAGNOSIS — I255 Ischemic cardiomyopathy: Secondary | ICD-10-CM | POA: Diagnosis not present

## 2017-03-17 DIAGNOSIS — I5023 Acute on chronic systolic (congestive) heart failure: Secondary | ICD-10-CM | POA: Diagnosis not present

## 2017-03-19 ENCOUNTER — Encounter (HOSPITAL_COMMUNITY): Payer: Self-pay

## 2017-03-19 ENCOUNTER — Ambulatory Visit (HOSPITAL_COMMUNITY)
Admission: RE | Admit: 2017-03-19 | Discharge: 2017-03-19 | Disposition: A | Discharge status: Home / Self Care | Payer: Medicare Other | Source: Ambulatory Visit | Attending: Neurology | Admitting: Neurology

## 2017-03-19 ENCOUNTER — Observation Stay (HOSPITAL_COMMUNITY)
Admission: RE | Admit: 2017-03-19 | Discharge: 2017-03-19 | Disposition: A | Discharge status: Home / Self Care | Payer: Medicare Other | Source: Ambulatory Visit | Attending: Neurology | Admitting: Neurology

## 2017-03-19 DIAGNOSIS — M316 Other giant cell arteritis: Secondary | ICD-10-CM

## 2017-03-19 DIAGNOSIS — R29898 Other symptoms and signs involving the musculoskeletal system: Secondary | ICD-10-CM

## 2017-03-19 NOTE — Progress Notes (Signed)
Pt was DC, pt was told to see cardiologist or go to ED, he was given number for central scheduling to call to reschedule spinal when fluid load is better and can lay flat more comfortably. pt verbalized understanding.

## 2017-03-19 NOTE — Progress Notes (Signed)
On arrival into the room the pt states, I need help with my legs, I have so much swelling that I need fluid removed. Has cellulitis // being treating for in right leg. But over night he has gained 3 lbs and has new left leg additional edema, not eating, legs 3+ edema, tourniquet was placed on arm and has notable fluid retention, been sleeping in his chair, 3+ pitting edema in bilateral legs, right leg red with scabbed sore, only complains of SOB with exertion. I contacted Dr Dover// radiologist  to do spinal procedure and updated him on pts condition. He advised to lay pt flat an observe for 15 minutes. During this time pt has slight effort to speak but not to where I need to sit him up.

## 2017-03-20 DIAGNOSIS — I2510 Atherosclerotic heart disease of native coronary artery without angina pectoris: Secondary | ICD-10-CM | POA: Diagnosis not present

## 2017-03-20 DIAGNOSIS — I10 Essential (primary) hypertension: Secondary | ICD-10-CM | POA: Diagnosis not present

## 2017-03-20 DIAGNOSIS — I429 Cardiomyopathy, unspecified: Secondary | ICD-10-CM | POA: Diagnosis not present

## 2017-03-20 DIAGNOSIS — I5022 Chronic systolic (congestive) heart failure: Secondary | ICD-10-CM | POA: Diagnosis not present

## 2017-03-20 DIAGNOSIS — I5023 Acute on chronic systolic (congestive) heart failure: Secondary | ICD-10-CM | POA: Diagnosis not present

## 2017-03-20 DIAGNOSIS — I255 Ischemic cardiomyopathy: Secondary | ICD-10-CM | POA: Diagnosis not present

## 2017-03-20 DIAGNOSIS — I472 Ventricular tachycardia: Secondary | ICD-10-CM | POA: Diagnosis not present

## 2017-03-21 DIAGNOSIS — M069 Rheumatoid arthritis, unspecified: Secondary | ICD-10-CM | POA: Diagnosis not present

## 2017-03-21 DIAGNOSIS — I110 Hypertensive heart disease with heart failure: Secondary | ICD-10-CM | POA: Diagnosis not present

## 2017-03-21 DIAGNOSIS — M6281 Muscle weakness (generalized): Secondary | ICD-10-CM | POA: Diagnosis not present

## 2017-03-21 DIAGNOSIS — I2510 Atherosclerotic heart disease of native coronary artery without angina pectoris: Secondary | ICD-10-CM | POA: Diagnosis not present

## 2017-03-21 DIAGNOSIS — I255 Ischemic cardiomyopathy: Secondary | ICD-10-CM | POA: Diagnosis not present

## 2017-03-21 DIAGNOSIS — I5023 Acute on chronic systolic (congestive) heart failure: Secondary | ICD-10-CM | POA: Diagnosis not present

## 2017-03-23 DIAGNOSIS — M069 Rheumatoid arthritis, unspecified: Secondary | ICD-10-CM | POA: Diagnosis not present

## 2017-03-23 DIAGNOSIS — I5023 Acute on chronic systolic (congestive) heart failure: Secondary | ICD-10-CM | POA: Diagnosis not present

## 2017-03-23 DIAGNOSIS — I2510 Atherosclerotic heart disease of native coronary artery without angina pectoris: Secondary | ICD-10-CM | POA: Diagnosis not present

## 2017-03-23 DIAGNOSIS — I255 Ischemic cardiomyopathy: Secondary | ICD-10-CM | POA: Diagnosis not present

## 2017-03-23 DIAGNOSIS — M6281 Muscle weakness (generalized): Secondary | ICD-10-CM | POA: Diagnosis not present

## 2017-03-23 DIAGNOSIS — I110 Hypertensive heart disease with heart failure: Secondary | ICD-10-CM | POA: Diagnosis not present

## 2017-03-24 DIAGNOSIS — I110 Hypertensive heart disease with heart failure: Secondary | ICD-10-CM | POA: Diagnosis not present

## 2017-03-24 DIAGNOSIS — I255 Ischemic cardiomyopathy: Secondary | ICD-10-CM | POA: Diagnosis not present

## 2017-03-24 DIAGNOSIS — I2510 Atherosclerotic heart disease of native coronary artery without angina pectoris: Secondary | ICD-10-CM | POA: Diagnosis not present

## 2017-03-24 DIAGNOSIS — I5023 Acute on chronic systolic (congestive) heart failure: Secondary | ICD-10-CM | POA: Diagnosis not present

## 2017-03-24 DIAGNOSIS — M6281 Muscle weakness (generalized): Secondary | ICD-10-CM | POA: Diagnosis not present

## 2017-03-24 DIAGNOSIS — M069 Rheumatoid arthritis, unspecified: Secondary | ICD-10-CM | POA: Diagnosis not present

## 2017-03-25 DIAGNOSIS — M05749 Rheumatoid arthritis with rheumatoid factor of unspecified hand without organ or systems involvement: Secondary | ICD-10-CM | POA: Diagnosis not present

## 2017-03-25 DIAGNOSIS — G44321 Chronic post-traumatic headache, intractable: Secondary | ICD-10-CM | POA: Diagnosis not present

## 2017-03-25 DIAGNOSIS — R234 Changes in skin texture: Secondary | ICD-10-CM | POA: Diagnosis not present

## 2017-03-25 DIAGNOSIS — I5022 Chronic systolic (congestive) heart failure: Secondary | ICD-10-CM | POA: Diagnosis not present

## 2017-03-25 DIAGNOSIS — R531 Weakness: Secondary | ICD-10-CM | POA: Diagnosis not present

## 2017-03-25 DIAGNOSIS — G9340 Encephalopathy, unspecified: Secondary | ICD-10-CM | POA: Diagnosis not present

## 2017-03-25 DIAGNOSIS — L03115 Cellulitis of right lower limb: Secondary | ICD-10-CM | POA: Diagnosis not present

## 2017-03-25 DIAGNOSIS — R700 Elevated erythrocyte sedimentation rate: Secondary | ICD-10-CM | POA: Diagnosis not present

## 2017-03-25 DIAGNOSIS — B957 Other staphylococcus as the cause of diseases classified elsewhere: Secondary | ICD-10-CM | POA: Diagnosis not present

## 2017-03-26 DIAGNOSIS — M069 Rheumatoid arthritis, unspecified: Secondary | ICD-10-CM | POA: Diagnosis not present

## 2017-03-26 DIAGNOSIS — I110 Hypertensive heart disease with heart failure: Secondary | ICD-10-CM | POA: Diagnosis not present

## 2017-03-26 DIAGNOSIS — I2510 Atherosclerotic heart disease of native coronary artery without angina pectoris: Secondary | ICD-10-CM | POA: Diagnosis not present

## 2017-03-26 DIAGNOSIS — M6281 Muscle weakness (generalized): Secondary | ICD-10-CM | POA: Diagnosis not present

## 2017-03-26 DIAGNOSIS — I5023 Acute on chronic systolic (congestive) heart failure: Secondary | ICD-10-CM | POA: Diagnosis not present

## 2017-03-26 DIAGNOSIS — I255 Ischemic cardiomyopathy: Secondary | ICD-10-CM | POA: Diagnosis not present

## 2017-03-28 DIAGNOSIS — I255 Ischemic cardiomyopathy: Secondary | ICD-10-CM | POA: Diagnosis not present

## 2017-03-28 DIAGNOSIS — I5023 Acute on chronic systolic (congestive) heart failure: Secondary | ICD-10-CM | POA: Diagnosis not present

## 2017-03-28 DIAGNOSIS — M6281 Muscle weakness (generalized): Secondary | ICD-10-CM | POA: Diagnosis not present

## 2017-03-28 DIAGNOSIS — M069 Rheumatoid arthritis, unspecified: Secondary | ICD-10-CM | POA: Diagnosis not present

## 2017-03-28 DIAGNOSIS — I2510 Atherosclerotic heart disease of native coronary artery without angina pectoris: Secondary | ICD-10-CM | POA: Diagnosis not present

## 2017-03-28 DIAGNOSIS — I110 Hypertensive heart disease with heart failure: Secondary | ICD-10-CM | POA: Diagnosis not present

## 2017-03-30 DIAGNOSIS — I255 Ischemic cardiomyopathy: Secondary | ICD-10-CM | POA: Diagnosis not present

## 2017-03-30 DIAGNOSIS — M069 Rheumatoid arthritis, unspecified: Secondary | ICD-10-CM | POA: Diagnosis not present

## 2017-03-30 DIAGNOSIS — I110 Hypertensive heart disease with heart failure: Secondary | ICD-10-CM | POA: Diagnosis not present

## 2017-03-30 DIAGNOSIS — I2510 Atherosclerotic heart disease of native coronary artery without angina pectoris: Secondary | ICD-10-CM | POA: Diagnosis not present

## 2017-03-30 DIAGNOSIS — M6281 Muscle weakness (generalized): Secondary | ICD-10-CM | POA: Diagnosis not present

## 2017-03-30 DIAGNOSIS — I5023 Acute on chronic systolic (congestive) heart failure: Secondary | ICD-10-CM | POA: Diagnosis not present

## 2017-03-31 DIAGNOSIS — I2510 Atherosclerotic heart disease of native coronary artery without angina pectoris: Secondary | ICD-10-CM | POA: Diagnosis not present

## 2017-03-31 DIAGNOSIS — R748 Abnormal levels of other serum enzymes: Secondary | ICD-10-CM | POA: Diagnosis not present

## 2017-03-31 DIAGNOSIS — E038 Other specified hypothyroidism: Secondary | ICD-10-CM | POA: Diagnosis not present

## 2017-03-31 DIAGNOSIS — I10 Essential (primary) hypertension: Secondary | ICD-10-CM | POA: Diagnosis not present

## 2017-03-31 DIAGNOSIS — R51 Headache: Secondary | ICD-10-CM | POA: Diagnosis not present

## 2017-03-31 DIAGNOSIS — M159 Polyosteoarthritis, unspecified: Secondary | ICD-10-CM | POA: Diagnosis not present

## 2017-03-31 DIAGNOSIS — Z6828 Body mass index (BMI) 28.0-28.9, adult: Secondary | ICD-10-CM | POA: Diagnosis not present

## 2017-03-31 DIAGNOSIS — I509 Heart failure, unspecified: Secondary | ICD-10-CM | POA: Diagnosis not present

## 2017-04-01 ENCOUNTER — Encounter (INDEPENDENT_AMBULATORY_CARE_PROVIDER_SITE_OTHER): Payer: Self-pay | Admitting: Vascular Surgery

## 2017-04-01 ENCOUNTER — Ambulatory Visit (INDEPENDENT_AMBULATORY_CARE_PROVIDER_SITE_OTHER): Payer: Medicare Other | Admitting: Vascular Surgery

## 2017-04-01 VITALS — BP 111/63 | HR 72 | Resp 17 | Ht 68.0 in | Wt 184.0 lb

## 2017-04-01 DIAGNOSIS — I5020 Unspecified systolic (congestive) heart failure: Secondary | ICD-10-CM

## 2017-04-01 DIAGNOSIS — I5023 Acute on chronic systolic (congestive) heart failure: Secondary | ICD-10-CM | POA: Diagnosis not present

## 2017-04-01 DIAGNOSIS — L97912 Non-pressure chronic ulcer of unspecified part of right lower leg with fat layer exposed: Secondary | ICD-10-CM | POA: Diagnosis not present

## 2017-04-01 DIAGNOSIS — I255 Ischemic cardiomyopathy: Secondary | ICD-10-CM | POA: Diagnosis not present

## 2017-04-01 DIAGNOSIS — M069 Rheumatoid arthritis, unspecified: Secondary | ICD-10-CM | POA: Diagnosis not present

## 2017-04-01 DIAGNOSIS — M6281 Muscle weakness (generalized): Secondary | ICD-10-CM | POA: Diagnosis not present

## 2017-04-01 DIAGNOSIS — I110 Hypertensive heart disease with heart failure: Secondary | ICD-10-CM | POA: Diagnosis not present

## 2017-04-01 DIAGNOSIS — R600 Localized edema: Secondary | ICD-10-CM

## 2017-04-01 DIAGNOSIS — I2510 Atherosclerotic heart disease of native coronary artery without angina pectoris: Secondary | ICD-10-CM | POA: Diagnosis not present

## 2017-04-01 NOTE — Progress Notes (Signed)
Subjective:    Patient ID: [name], male    DOB: 04/26/1940, 77 y.o.   MRN: [id number] Chief Complaint  Patient presents with  . New Patient (Initial Visit)    BLE edema   Presents as a new patient referred by Dr. Manuella Ghazi for evaluation of bilateral lower extremity edema with ulcer formation.  Patient was seen with his wife.  The patient has been experiencing progressively worsening bilateral lower extremity edema since December 2018.  The patient developed ulcerations weeping clear fluid to the right calf.  The patient has been treated with oral antibiotics for cellulitis in the past.  The patient is receiving home visiting nursing services who are applying zinc oxide 3 layer Unna wrap to the right lower extremity and plain Unna wrap to the left lower extremity.  The patient has noted an improvement in his edema however his ulcerations remain.  The patient has not received any local wound care for these ulcerations.  The patient denies any bilateral lower extremity pain however states an overall weakness to his legs.  The patient denies any recent trauma or surgery to the bilateral lower extremity.  The patient denies any DVT history to the bilateral lower extremity.  The patient denies any fever, nausea or vomiting.  Review of Systems  Constitutional: Negative.   HENT: Negative.   Eyes: Negative.   Respiratory: Negative.   Cardiovascular: Positive for leg swelling.  Gastrointestinal: Negative.   Endocrine: Negative.   Genitourinary: Negative.   Musculoskeletal: Negative.   Skin: Positive for wound.  Allergic/Immunologic: Negative.   Neurological: Negative.   Hematological: Negative.   Psychiatric/Behavioral: Negative.       Objective:   Physical Exam  Constitutional: He is oriented to person, place, and time. He appears well-developed and well-nourished. No distress.  HENT:  Head: Normocephalic and atraumatic.  Eyes: Pupils are equal, round, and reactive to light.  Conjunctivae are normal.  Neck: Normal range of motion.  Cardiovascular: Normal rate, regular rhythm, normal heart sounds and intact distal pulses.  Pulses:      Radial pulses are 2+ on the right side, and 2+ on the left side.  Hard to palpate pedal pulses bilaterally  Pulmonary/Chest: Effort normal and breath sounds normal.  Musculoskeletal: Normal range of motion. He exhibits edema (Moderate nonpitting edema noted to the bilateral lower extremity).  Neurological: He is alert and oriented to person, place, and time.  Skin: He is not diaphoretic.  Right calf: 4-5 scattered less than 4 cm circular ulcerations noted to the right calf.  None have a healthy 100% granulation tissue wound bed.  Most are noted to have 100% fibrinous exudate to the wound bed.  They are not necrotic.  There is no infection.  There is no cellulitis.  There is no drainage.  Psychiatric: He has a normal mood and affect. His behavior is normal. Judgment and thought content normal.  Vitals reviewed.  BP 111/63 (BP Location: Right Arm, Patient Position: Sitting)   Pulse 72   Resp 17   Ht 5\' 8"  (1.727 m)   Wt 184 lb (83.5 kg)   BMI 27.98 kg/m   Past Medical History:  Diagnosis Date  . AICD (automatic cardioverter/defibrillator) present    [employer]  . Arthritis    RA  . Balance problem   . CHF (congestive heart failure) ([place])   . Collagen vascular disease ([place])    RA since 2014  . Coronary artery disease   . Deviated nasal septum   .  Dysrhythmia   . Hearing loss    TINNITUS  . High cholesterol   . Leg weakness   . Myocardial infarction ([place])   . Presence of permanent cardiac pacemaker    WITH DEFIBRILATOR ([employer])  . Seasonal allergies    RHINITIS  . Shortness of breath dyspnea    COUGH  . Sinus headache   . Sinusitis    CHRONIC  . Sleep apnea   . Vertigo    DYSEQUILIBRIUM   Social History   Socioeconomic History  . Marital status: Married    Spouse name: [name]  . Number of  children: 3  . Years of education: 50  . Highest education level: Not on file  Occupational History    Comment: Retired  Scientific laboratory technician  . Financial resource strain: Not on file  . Food insecurity:    Worry: Not on file    Inability: Not on file  . Transportation needs:    Medical: Not on file    Non-medical: Not on file  Tobacco Use  . Smoking status: Never Smoker  . Smokeless tobacco: Never Used  Substance and Sexual Activity  . Alcohol use: No    Alcohol/week: 0.0 oz  . Drug use: No  . Sexual activity: Not on file  Lifestyle  . Physical activity:    Days per week: Not on file    Minutes per session: Not on file  . Stress: Not on file  Relationships  . Social connections:    Talks on phone: Not on file    Gets together: Not on file    Attends religious service: Not on file    Active member of club or organization: Not on file    Attends meetings of clubs or organizations: Not on file    Relationship status: Not on file  . Intimate partner violence:    Fear of current or ex partner: Not on file    Emotionally abused: Not on file    Physically abused: Not on file    Forced sexual activity: Not on file  Other Topics Concern  . Not on file  Social History Narrative   Patient lives at home with his wife [name]).   Retired.   Education 12th grade.   Right handed.   Caffeine one cup daily.   Past Surgical History:  Procedure Laterality Date  . CARDIAC SURGERY    . CATARACT EXTRACTION     BILATERAL  . CORONARY ANGIOPLASTY     STENTS  . CORONARY ARTERY BYPASS GRAFT  1992  . heart pump    . INSERT / REPLACE / REMOVE PACEMAKER    . SEPTOPLASTY     Family History  Problem Relation Age of Onset  . Alzheimer's disease Mother    Allergies  Allergen Reactions  . Pravastatin Other (See Comments)    Reaction:  Joint pain       Assessment & Plan:  Presents as a new patient referred by Dr. Manuella Ghazi for evaluation of bilateral lower extremity edema with ulcer formation.   Patient was seen with his wife.  The patient has been experiencing progressively worsening bilateral lower extremity edema since December 2018.  The patient developed ulcerations weeping clear fluid to the right calf.  The patient has been treated with oral antibiotics for cellulitis in the past.  The patient is receiving home visiting nursing services who are applying zinc oxide 3 layer Unna wrap to the right lower extremity and plain Unna wrap to  the left lower extremity.  The patient has noted an improvement in his edema however his ulcerations remain.  The patient has not received any local wound care for these ulcerations.  The patient denies any bilateral lower extremity pain however states an overall weakness to his legs.  The patient denies any recent trauma or surgery to the bilateral lower extremity.  The patient denies any DVT history to the bilateral lower extremity.  The patient denies any fever, nausea or vomiting.  1. Bilateral lower extremity edema - New Recommend continued Unna boot therapy to the bilateral lower extremity. Recommend Unna boot therapy until the ulcerations to the right lower extremity are healed and his edema is better controlled.  At that point I will transition him into medical grade 1 compression socks. We reviewed appropriate elevation as heart level or higher as much as possible I will bring the patient back to undergo bilateral venous duplex in approximately 1 month to rule out any contributing venous disease. The patient currently has a visiting nurse who will continue to apply the Unna boots. I think the patient would benefit from a lymphedema pump as an added therapy I will bring the patient back in 1 month to assess his progress with Unna boot therapy, elevation and wound care at that time we can apply for lymphedema pump Patient and his wife are in agreement  - VAS [place] LOWER EXTREMITY VENOUS REFLUX; Future  2. Ulcer of right lower extremity with fat layer  exposed ([place]) - New Patient with ulcerations most likely due to a lymphedema exacerbation They are slow to heal with fibrinous exudate noted to each wound bed I will refer the patient to a wound doctor for local wound care We will also bring the patient back to undergo an ABI to rule out any contributing peripheral artery disease as the patient has multiple risk factors for peripheral arterial disease.  - VAS [place] ABI WITH/WO TBI; Future - AMB referral to wound care center  3. Systolic heart failure, unspecified HF chronicity (HCC) - Stable This is a contributing factor to the patient's lower extremity edema  Current Outpatient Medications on File Prior to Visit  Medication Sig Dispense Refill  . acetaminophen (TYLENOL) 325 MG tablet Take 2 tablets (650 mg total) by mouth every 6 (six) hours as needed for mild pain (or Fever >/= 101).    [name] amiodarone (PACERONE) 400 MG tablet Take 300 mg by mouth daily.     [name] aspirin EC 81 MG tablet Take 81 mg by mouth daily.    . cetirizine (ZYRTEC) 10 MG tablet Take 10 mg by mouth daily.    . Cholecalciferol (VITAMIN D3) 2000 UNITS TABS Take 2,000 Units by mouth daily.     [name] docusate sodium (COLACE) 100 MG capsule Take 100 mg by mouth every evening.    . furosemide (LASIX) 40 MG tablet Take 40 mg by mouth daily.     [name] levothyroxine (SYNTHROID, LEVOTHROID) 50 MCG tablet Take 1 tablet by mouth every morning.     . metoprolol succinate (TOPROL-XL) 25 MG 24 hr tablet Take 25 mg by mouth 2 (two) times daily.     [name] mexiletine (MEXITIL) 150 MG capsule Take 150 mg by mouth 3 (three) times daily.     . nitroGLYCERIN (NITROSTAT) 0.4 MG SL tablet Place 0.4 mg under the tongue every 5 (five) minutes as needed for chest pain.    . Polyethylene Glycol 3350 (PEG 3350) POWD Take by mouth.    [name]  potassium chloride (K-DUR) 10 MEQ tablet Take by mouth.    . traMADol (ULTRAM) 50 MG tablet Take 1 tablet by mouth 3 (three) times daily as needed.    . vitamin B-12  (CYANOCOBALAMIN) 1000 MCG tablet Take 1,000 mcg by mouth daily.    . fluticasone (FLONASE) 50 MCG/ACT nasal spray Place 1 spray into both nostrils daily as needed for rhinitis.     [name] leflunomide (ARAVA) 20 MG tablet Take 20 mg by mouth every evening.    . nystatin (MYCOSTATIN) 100000 UNIT/ML suspension Take 5 mLs (500,000 Units total) by mouth 4 (four) times daily. (Patient not taking: Reported on 04/01/2017) 60 mL 0  . predniSONE (DELTASONE) 20 MG tablet Take 2.5 tablets (50 mg total) by mouth daily with breakfast. (Patient not taking: Reported on 04/01/2017)     No current facility-administered medications on file prior to visit.    There are no Patient Instructions on file for this visit. No follow-ups on file.  Girtha Kilgore A Costella Schwarz, PA-C

## 2017-04-03 DIAGNOSIS — I2510 Atherosclerotic heart disease of native coronary artery without angina pectoris: Secondary | ICD-10-CM | POA: Diagnosis not present

## 2017-04-03 DIAGNOSIS — I255 Ischemic cardiomyopathy: Secondary | ICD-10-CM | POA: Diagnosis not present

## 2017-04-03 DIAGNOSIS — E038 Other specified hypothyroidism: Secondary | ICD-10-CM | POA: Diagnosis not present

## 2017-04-03 DIAGNOSIS — M069 Rheumatoid arthritis, unspecified: Secondary | ICD-10-CM | POA: Diagnosis not present

## 2017-04-03 DIAGNOSIS — I5023 Acute on chronic systolic (congestive) heart failure: Secondary | ICD-10-CM | POA: Diagnosis not present

## 2017-04-03 DIAGNOSIS — M6281 Muscle weakness (generalized): Secondary | ICD-10-CM | POA: Diagnosis not present

## 2017-04-03 DIAGNOSIS — I110 Hypertensive heart disease with heart failure: Secondary | ICD-10-CM | POA: Diagnosis not present

## 2017-04-07 DIAGNOSIS — I5023 Acute on chronic systolic (congestive) heart failure: Secondary | ICD-10-CM | POA: Diagnosis not present

## 2017-04-07 DIAGNOSIS — I110 Hypertensive heart disease with heart failure: Secondary | ICD-10-CM | POA: Diagnosis not present

## 2017-04-07 DIAGNOSIS — I255 Ischemic cardiomyopathy: Secondary | ICD-10-CM | POA: Diagnosis not present

## 2017-04-07 DIAGNOSIS — M6281 Muscle weakness (generalized): Secondary | ICD-10-CM | POA: Diagnosis not present

## 2017-04-07 DIAGNOSIS — I2510 Atherosclerotic heart disease of native coronary artery without angina pectoris: Secondary | ICD-10-CM | POA: Diagnosis not present

## 2017-04-07 DIAGNOSIS — M069 Rheumatoid arthritis, unspecified: Secondary | ICD-10-CM | POA: Diagnosis not present

## 2017-04-08 ENCOUNTER — Encounter: Payer: Medicare Other | Attending: Internal Medicine | Admitting: Internal Medicine

## 2017-04-08 DIAGNOSIS — L97512 Non-pressure chronic ulcer of other part of right foot with fat layer exposed: Secondary | ICD-10-CM | POA: Diagnosis not present

## 2017-04-08 DIAGNOSIS — L97521 Non-pressure chronic ulcer of other part of left foot limited to breakdown of skin: Secondary | ICD-10-CM | POA: Diagnosis not present

## 2017-04-08 DIAGNOSIS — I70238 Atherosclerosis of native arteries of right leg with ulceration of other part of lower right leg: Secondary | ICD-10-CM | POA: Diagnosis not present

## 2017-04-08 DIAGNOSIS — L97511 Non-pressure chronic ulcer of other part of right foot limited to breakdown of skin: Secondary | ICD-10-CM | POA: Diagnosis not present

## 2017-04-08 DIAGNOSIS — L97519 Non-pressure chronic ulcer of other part of right foot with unspecified severity: Secondary | ICD-10-CM | POA: Insufficient documentation

## 2017-04-08 DIAGNOSIS — L97312 Non-pressure chronic ulcer of right ankle with fat layer exposed: Secondary | ICD-10-CM | POA: Diagnosis not present

## 2017-04-08 DIAGNOSIS — L97812 Non-pressure chronic ulcer of other part of right lower leg with fat layer exposed: Secondary | ICD-10-CM | POA: Diagnosis not present

## 2017-04-08 DIAGNOSIS — I70233 Atherosclerosis of native arteries of right leg with ulceration of ankle: Secondary | ICD-10-CM | POA: Diagnosis not present

## 2017-04-08 DIAGNOSIS — L97211 Non-pressure chronic ulcer of right calf limited to breakdown of skin: Secondary | ICD-10-CM | POA: Insufficient documentation

## 2017-04-08 DIAGNOSIS — I255 Ischemic cardiomyopathy: Secondary | ICD-10-CM | POA: Insufficient documentation

## 2017-04-08 DIAGNOSIS — M069 Rheumatoid arthritis, unspecified: Secondary | ICD-10-CM | POA: Diagnosis not present

## 2017-04-08 DIAGNOSIS — L97212 Non-pressure chronic ulcer of right calf with fat layer exposed: Secondary | ICD-10-CM | POA: Diagnosis not present

## 2017-04-08 DIAGNOSIS — L97219 Non-pressure chronic ulcer of right calf with unspecified severity: Secondary | ICD-10-CM | POA: Diagnosis not present

## 2017-04-08 DIAGNOSIS — I70232 Atherosclerosis of native arteries of right leg with ulceration of calf: Secondary | ICD-10-CM | POA: Diagnosis not present

## 2017-04-08 DIAGNOSIS — I70235 Atherosclerosis of native arteries of right leg with ulceration of other part of foot: Secondary | ICD-10-CM | POA: Diagnosis not present

## 2017-04-09 DIAGNOSIS — M6281 Muscle weakness (generalized): Secondary | ICD-10-CM | POA: Diagnosis not present

## 2017-04-09 DIAGNOSIS — M069 Rheumatoid arthritis, unspecified: Secondary | ICD-10-CM | POA: Diagnosis not present

## 2017-04-09 DIAGNOSIS — I255 Ischemic cardiomyopathy: Secondary | ICD-10-CM | POA: Diagnosis not present

## 2017-04-09 DIAGNOSIS — I110 Hypertensive heart disease with heart failure: Secondary | ICD-10-CM | POA: Diagnosis not present

## 2017-04-09 DIAGNOSIS — I2510 Atherosclerotic heart disease of native coronary artery without angina pectoris: Secondary | ICD-10-CM | POA: Diagnosis not present

## 2017-04-09 DIAGNOSIS — I5023 Acute on chronic systolic (congestive) heart failure: Secondary | ICD-10-CM | POA: Diagnosis not present

## 2017-04-10 DIAGNOSIS — Z6828 Body mass index (BMI) 28.0-28.9, adult: Secondary | ICD-10-CM | POA: Diagnosis not present

## 2017-04-10 DIAGNOSIS — I509 Heart failure, unspecified: Secondary | ICD-10-CM | POA: Diagnosis not present

## 2017-04-10 DIAGNOSIS — J189 Pneumonia, unspecified organism: Secondary | ICD-10-CM | POA: Diagnosis not present

## 2017-04-10 NOTE — Progress Notes (Signed)
[name], [name] ([id number]) Visit Report for 04/08/2017 Abuse/Suicide Risk Screen Details Patient Name: [name], [name] Date of Service: 04/08/2017 9:45 AM Medical Record Number: [id number] Patient Account Number: [id number] Date of Birth/Sex: [birth year] (77 y.o. Male) Treating RN: Ahmed Prima Primary Care Gunter Conde: Cyndi Bender Other Clinician: Referring Seaborn Nakama: Marcelle Overlie Treating Ura Hausen/Extender: Ricard Dillon Weeks in Treatment: 0 Abuse/Suicide Risk Screen Items Answer ABUSE/SUICIDE RISK SCREEN: Has anyone close to you tried to hurt or harm you recentlyo No Do you feel uncomfortable with anyone in your familyo No Has anyone forced you do things that you didnot want to doo No Do you have any thoughts of harming yourselfo No Patient displays signs or symptoms of abuse and/or neglect. No Electronic Signature(s) Signed: 04/09/2017 4:32:30 PM By: Alric Quan Entered By: Alric Quan on 04/08/2017 10:21:00 Karger, [name] ([id number]) -------------------------------------------------------------------------------- Activities of Daily Living Details Patient Name: [name] Date of Service: 04/08/2017 9:45 AM Medical Record Number: [id number] Patient Account Number: [id number] Date of Birth/Sex: [birth year] (77 y.o. Male) Treating RN: Ahmed Prima Primary Care Myha Arizpe: Cyndi Bender Other Clinician: Referring Marcella Charlson: Marcelle Overlie Treating Mava Suares/Extender: Ricard Dillon Weeks in Treatment: 0 Activities of Daily Living Items Answer Activities of Daily Living (Please select one for each item) Drive Automobile Not Able Take Medications Need Assistance Use Telephone Completely [place] for Appearance Need Assistance Use Toilet Need Assistance Bath / Shower Need Assistance Dress Self Need Assistance Feed Self Completely Able Walk Need Assistance Get In / Out Bed Need Assistance Housework Not Able Prepare Meals Not  Able Handle Money Need Assistance Shop for Self Need Assistance Electronic Signature(s) Signed: 04/09/2017 4:32:30 PM By: Alric Quan Entered By: Alric Quan on 04/08/2017 10:21:46 Sena, [name] ([id number]) -------------------------------------------------------------------------------- Education Assessment Details Patient Name: [name] Date of Service: 04/08/2017 9:45 AM Medical Record Number: [id number] Patient Account Number: [id number] Date of Birth/Sex: Aug 10, 1940 (77 y.o. Male) Treating RN: Carolyne Fiscal, Debi Primary Care Pookela Sellin: Cyndi Bender Other Clinician: Referring Lounette Sloan: Marcelle Overlie Treating Nimah Uphoff/Extender: Tito Dine in Treatment: 0 Primary Learner Assessed: Patient Learning Preferences/Education Level/Primary Language Learning Preference: Explanation, Printed Material Highest Education Level: High School Preferred Language: English Cognitive Barrier Assessment/Beliefs Language Barrier: No Translator Needed: No Memory Deficit: No Emotional Barrier: No Cultural/Religious Beliefs Affecting Medical Care: No Physical Barrier Assessment Impaired Vision: Yes Glasses Impaired Hearing: No Decreased Hand dexterity: No Knowledge/Comprehension Assessment Knowledge Level: Medium Comprehension Level: Medium Ability to understand written Medium instructions: Ability to understand verbal Medium instructions: Motivation Assessment Anxiety Level: Calm Cooperation: Cooperative Education Importance: Acknowledges Need Interest in Health Problems: Asks Questions Perception: Coherent Willingness to Engage in Self- Medium Management Activities: Readiness to Engage in Self- Medium Management Activities: Electronic Signature(s) Signed: 04/09/2017 4:32:30 PM By: Alric Quan Entered By: Alric Quan on 04/08/2017 10:22:41 Shams, [name]  ([id number]) -------------------------------------------------------------------------------- Fall Risk Assessment Details Patient Name: [name] Date of Service: 04/08/2017 9:45 AM Medical Record Number: [id number] Patient Account Number: [id number] Date of Birth/Sex: [birth year] (77 y.o. Male) Treating RN: Carolyne Fiscal, Debi Primary Care Maudene Stotler: Cyndi Bender Other Clinician: Referring Michelene Keniston: Marcelle Overlie Treating Chi Garlow/Extender: Tito Dine in Treatment: 0 Fall Risk Assessment Items Have you had 2 or more falls in the last 12 monthso 0 Yes Have you had any fall that resulted in injury in the last 12 monthso 0 Yes FALL RISK ASSESSMENT: History of falling - immediate or within 3 months 25 Yes Secondary diagnosis 15 Yes Ambulatory aid None/bed rest/wheelchair/nurse 0 No Crutches/cane/walker 15 Yes  Furniture 0 No IV Access/Saline Lock 0 No Gait/Training Normal/bed rest/immobile 0 No Weak 10 Yes Impaired 20 Yes Mental Status Oriented to own ability 0 Yes Electronic Signature(s) Signed: 04/09/2017 4:32:30 PM By: Alric Quan Entered By: Alric Quan on 04/08/2017 10:24:36 Spainhour, [name] ([id number]) -------------------------------------------------------------------------------- [name] Assessment Details Patient Name: [name] Date of Service: 04/08/2017 9:45 AM Medical Record Number: [id number] Patient Account Number: [id number] Date of Birth/Sex: [birth year] (77 y.o. Male) Treating RN: Ahmed Prima Primary Care Jehiel Koepp: Cyndi Bender Other Clinician: Referring Shawniece Oyola: Marcelle Overlie Treating Lincoln Ginley/Extender: Ricard Dillon Weeks in Treatment: 0 [name] Assessment Items Site Locations + = Sensation present, - = Sensation absent, C = Callus, U = Ulcer R = Redness, W = Warmth, M = Maceration, PU = Pre-ulcerative lesion F = Fissure, S = Swelling, D = Dryness Assessment Right: Left: Other Deformity: No No Prior  [name] Ulcer: No No Prior Amputation: No No Charcot Joint: No No Ambulatory Status: Ambulatory With Help Assistance Device: Walker Gait: Steady Electronic Signature(s) Signed: 04/09/2017 4:32:30 PM By: Alric Quan Entered By: Alric Quan on 04/08/2017 10:32:12 Johanning, [name] ([id number]) -------------------------------------------------------------------------------- Nutrition Risk Assessment Details Patient Name: [name] Date of Service: 04/08/2017 9:45 AM Medical Record Number: [id number] Patient Account Number: [id number] Date of Birth/Sex: 1940-04-04 (77 y.o. Male) Treating RN: Carolyne Fiscal, Debi Primary Care Yaroslav Gombos: Cyndi Bender Other Clinician: Referring Jla Reynolds: Marcelle Overlie Treating Satine Hausner/Extender: Ricard Dillon Weeks in Treatment: 0 Height (in): 68 Weight (lbs): Body Mass Index (BMI): Nutrition Risk Assessment Items NUTRITION RISK SCREEN: I have an illness or condition that made me change the kind and/or amount of 2 Yes food I eat I eat fewer than two meals per day 3 Yes I eat few fruits and vegetables, or milk products 0 No I have three or more drinks of beer, liquor or wine almost every day 0 No I have tooth or mouth problems that make it hard for me to eat 0 No I don't always have enough money to buy the food I need 0 No I eat alone most of the time 0 No I take three or more different prescribed or over-the-counter drugs a day 1 Yes Without wanting to, I have lost or gained 10 pounds in the last six months 0 No I am not always physically able to shop, cook and/or feed myself 0 No Nutrition Protocols Good Risk Protocol Moderate Risk Protocol Electronic Signature(s) Signed: 04/09/2017 4:32:30 PM By: Alric Quan Entered By: Alric Quan on 04/08/2017 10:25:33

## 2017-04-11 DIAGNOSIS — I2510 Atherosclerotic heart disease of native coronary artery without angina pectoris: Secondary | ICD-10-CM | POA: Diagnosis not present

## 2017-04-11 DIAGNOSIS — M069 Rheumatoid arthritis, unspecified: Secondary | ICD-10-CM | POA: Diagnosis not present

## 2017-04-11 DIAGNOSIS — I110 Hypertensive heart disease with heart failure: Secondary | ICD-10-CM | POA: Diagnosis not present

## 2017-04-11 DIAGNOSIS — I255 Ischemic cardiomyopathy: Secondary | ICD-10-CM | POA: Diagnosis not present

## 2017-04-11 DIAGNOSIS — I5023 Acute on chronic systolic (congestive) heart failure: Secondary | ICD-10-CM | POA: Diagnosis not present

## 2017-04-11 DIAGNOSIS — M6281 Muscle weakness (generalized): Secondary | ICD-10-CM | POA: Diagnosis not present

## 2017-04-13 DIAGNOSIS — M6281 Muscle weakness (generalized): Secondary | ICD-10-CM | POA: Diagnosis not present

## 2017-04-13 DIAGNOSIS — I5023 Acute on chronic systolic (congestive) heart failure: Secondary | ICD-10-CM | POA: Diagnosis not present

## 2017-04-13 DIAGNOSIS — I255 Ischemic cardiomyopathy: Secondary | ICD-10-CM | POA: Diagnosis not present

## 2017-04-13 DIAGNOSIS — I110 Hypertensive heart disease with heart failure: Secondary | ICD-10-CM | POA: Diagnosis not present

## 2017-04-13 DIAGNOSIS — M069 Rheumatoid arthritis, unspecified: Secondary | ICD-10-CM | POA: Diagnosis not present

## 2017-04-13 DIAGNOSIS — I2510 Atherosclerotic heart disease of native coronary artery without angina pectoris: Secondary | ICD-10-CM | POA: Diagnosis not present

## 2017-04-13 NOTE — Progress Notes (Signed)
[name], [name] ([id number]) Visit Report for 04/08/2017 Allergy List Details Patient Name: [name], [name] Date of Service: 04/08/2017 9:45 AM Medical Record Number: [id number] Patient Account Number: [id number] Date of Birth/Sex: 25-Apr-1940 (77 y.o. Male) Treating RN: [name] Primary Care [name]: [name] Other Clinician: Referring [name]: [name] Treating [name]/Extender: [name] in Treatment: 0 Allergies Active Allergies NKDA Allergy Notes Electronic Signature(s) Signed: 04/09/2017 4:32:30 PM By: [name] Entered By: [name] [name], [name] ([id number]) -------------------------------------------------------------------------------- [place] Details Patient Name: [name] Date of Service: 04/08/2017 9:45 AM Medical Record Number: [id number] Patient Account Number: [id number] Date of Birth/Sex: Apr 23, 1940 (77 y.o. Male) Treating RN: [name] Primary Care Sharnee Douglass: [name] Other Clinician: Referring Dee Paden: [name] Treating [name]/Extender: [name] in Treatment: 0 Visit Information Patient Arrived: Walker Arrival Time: 09:54 Accompanied By: wife Transfer Assistance: EasyPivot Patient Lift Patient Identification Verified: Yes Secondary Verification Process Yes Completed: Patient Requires Transmission-Based No Precautions: Patient Has Alerts: Yes Patient Alerts: R ABI >220 Electronic Signature(s) Signed: 04/09/2017 4:32:30 PM By: [name] Entered By: [name] on 04/08/2017 11:05:08 [name], [name] ([id number]) -------------------------------------------------------------------------------- Clinic Level of Care Assessment Details Patient Name: [name] Date of Service: 04/08/2017 9:45 AM Medical Record Number: [id number] Patient Account Number: [id number] Date of Birth/Sex: July 09, 1940 (77 y.o.  Male) Treating RN: [name] Primary Care [name]: [name] Other Clinician: Referring Shunda Rabadi: [name] Treating Addalyne Vandehei/Extender: [name] in Treatment: 0 Clinic Level of Care Assessment Items TOOL 1 Quantity Score []  - Use when EandM and Procedure is performed on INITIAL visit 0 ASSESSMENTS - Nursing Assessment / Reassessment X - General Physical Exam (combine w/ comprehensive assessment (listed just below) when 1 20 performed on new pt. evals) X- 1 25 Comprehensive Assessment (HX, ROS, Risk Assessments, Wounds Hx, etc.) ASSESSMENTS - Wound and Skin Assessment / Reassessment []  - Dermatologic / Skin Assessment (not related to wound area) 0 ASSESSMENTS - Ostomy and/or Continence Assessment and Care []  - Incontinence Assessment and Management 0 []  - 0 Ostomy Care Assessment and Management (repouching, etc.) PROCESS - Coordination of Care X - Simple Patient / Family Education for ongoing care 1 15 []  - 0 Complex (extensive) Patient / Family Education for ongoing care X- 1 10 Staff obtains Programmer, systems, Records, Test Results / Process Orders []  - 0 Staff telephones HHA, Nursing Homes / Clarify orders / etc []  - 0 Routine Transfer to another Facility (non-emergent condition) []  - 0 Routine Hospital Admission (non-emergent condition) X- 1 15 New Admissions / Biomedical engineer / Ordering NPWT, Apligraf, etc. []  - 0 Emergency Hospital Admission (emergent condition) PROCESS - Special Needs []  - Pediatric / Minor Patient Management 0 []  - 0 Isolation Patient Management []  - 0 Hearing / Language / Visual special needs []  - 0 Assessment of Community assistance (transportation, D/C planning, etc.) []  - 0 Additional assistance / Altered mentation []  - 0 Support Surface(s) Assessment (bed, cushion, seat, etc.) [name], Jermaine E. ([name]) INTERVENTIONS - Miscellaneous []  - External ear exam 0 []  - 0 Patient Transfer (multiple staff /  Civil Service fast streamer / Similar devices) []  - 0 Simple Staple / Suture removal (25 or less) []  - 0 Complex Staple / Suture removal (26 or more) []  - 0 Hypo/Hyperglycemic Management (do not check if billed separately) X- 1 15 Ankle / Brachial Index (ABI) - do not check if billed separately Has the patient been seen at the hospital within the last  three years: Yes Total Score: 100 Level Of Care: New/Established - Level 3 Electronic Signature(s) Signed: 04/13/2017 9:08:37 AM By: [name], [name] on 04/08/2017 12:49:16 [name], [name] ([id number]) -------------------------------------------------------------------------------- Encounter Discharge Information Details Patient Name: [name] Date of Service: 04/08/2017 9:45 AM Medical Record Number: [id number] Patient Account Number: [id number] Date of Birth/Sex: [birth year] (77 y.o. Male) Treating RN: [name] Primary Care [name] Other Clinician: Referring [name]: [name] Treating Mathius Birkeland/Extender: [name] in Treatment: 0 Encounter Discharge Information Items Discharge Pain Level: 0 Discharge Condition: Stable Ambulatory Status: Walker Discharge Destination: Home Transportation: Private Auto Accompanied By: spouse Schedule Follow-up Appointment: Yes Medication Reconciliation completed and No provided to Patient/Care [name]: Provided on Clinical Summary of Care: 04/08/2017 Form Type Recipient Paper Patient LM Electronic Signature(s) Signed: 04/08/2017 4:21:37 PM By: [name] Entered By: [name] [name], [name] ([id number]) -------------------------------------------------------------------------------- Lower Extremity Assessment Details Patient Name: [name] Date of Service: 04/08/2017 9:45 AM Medical Record Number: [id number] Patient Account Number: [id number] Date of Birth/Sex:  01-22-1940 (77 y.o. Male) Treating RN: Carolyne Fiscal, Debi Primary Care Malakai Schoenherr: [name] Other Clinician: Referring Kiya Eno: [name] Treating Teyla Skidgel/Extender: [name] in Treatment: 0 Edema Assessment Assessed: [Left: No] [Right: No] [Left: Edema] [Right: :] Calf Left: Right: Point of Measurement: 27 cm From Medial Instep cm 30.5 cm Ankle Left: Right: Point of Measurement: 11 cm From Medial Instep cm 22.8 cm Vascular Assessment Claudication: Claudication Assessment [Right:Intermittent] Pulses: Dorsalis Pedis Palpable: [Right:Yes] Doppler Audible: [Right:Yes] Posterior Tibial Palpable: [Right:Yes] Doppler Audible: [Right:Yes] Extremity colors, hair growth, and conditions: Extremity Color: [Right:Hyperpigmented] Hair Growth on Extremity: [Right:Yes] Temperature of Extremity: [Right:Warm] Capillary Refill: [Right:> 3 seconds] Toe Nail Assessment Left: Right: Thick: Yes Discolored: Yes Deformed: Yes Improper Length and Hygiene: Yes Notes R ABI >220 Electronic Signature(s) Signed: 04/09/2017 4:32:30 PM By: [name] Entered By: [name] on 04/08/2017 11:04:27 [name], [name] ([id number]) Bulthuis, [name] ([id number]) -------------------------------------------------------------------------------- Multi Wound Chart Details Patient Name: [name] Date of Service: 04/08/2017 9:45 AM Medical Record Number: [id number] Patient Account Number: [id number] Date of Birth/Sex: 1940-04-14 (77 y.o. Male) Treating RN: [name] Primary Care Mialynn Shelvin: [name] Other Clinician: Referring Lejon Afzal: [name] Treating Sophira Rumler/Extender: [name] in Treatment: 0 Vital Signs Height(in): 68 Pulse(bpm): 59 Weight(lbs): Blood Pressure(mmHg): 107/55 Body Mass Index(BMI): Temperature(F): Respiratory Rate 16 (breaths/min): Photos: Wound Location: Right Lower Leg - Medial, Right Lower Leg -  Medial, Right [name] - Anterior Proximal Distal Wounding Event: Gradually Appeared Gradually Appeared Gradually Appeared Primary Etiology: Arterial Insufficiency Ulcer Arterial Insufficiency Ulcer Arterial Insufficiency Ulcer Comorbid History: Congestive Heart Failure, Congestive Heart Failure, Congestive Heart Failure, Coronary Artery Disease, Coronary Artery Disease, Coronary Artery Disease, Hypertension, Myocardial Hypertension, Myocardial Hypertension, Myocardial Infarction, Osteoarthritis Infarction, Osteoarthritis Infarction, Osteoarthritis Date Acquired: 01/15/2017 01/15/2017 01/15/2017 [name] of Treatment: 0 0 0 Wound Status: Open Open Open Clustered Quantity: 9 5 N/A Measurements L x W x D 0.6x0.5x0.1 0.6x0.4x0.2 0.9x0.7x0.3 (cm) Area (cm) : 0.236 0.188 0.495 Volume (cm) : 0.024 0.038 0.148 % Reduction in Area: 99.90% 99.00% N/A % Reduction in Volume: 99.90% 98.00% N/A Classification: Full Thickness Without Full Thickness Without Full Thickness Without Exposed Support Structures Exposed Support Structures Exposed Support Structures Exudate Amount: Large Large Large Exudate Type: Sanguinous Sanguinous Sanguinous Exudate Color: red red red Wound Margin: Distinct, outline attached Flat and Intact Flat and Intact Granulation Amount: Medium (34-66%) Medium (34-66%)  Medium (34-66%) Granulation Quality: Red Red Red Necrotic Amount: Medium (34-66%) Medium (34-66%) Medium (34-66%) Necrotic Tissue: Eschar, Adherent [place] Exposed Structures: ANSH, FAUBLE ([id number]) Fascia: No Fascia: No Fat Layer (Subcutaneous Fat Layer (Subcutaneous Fat Layer (Subcutaneous Tissue) Exposed: Yes Tissue) Exposed: No Tissue) Exposed: No Fascia: No Tendon: No Tendon: No Tendon: No Muscle: No Muscle: No Muscle: No Joint: No Joint: No Joint: No Bone: No Bone: No Bone: No Epithelialization: None None None Debridement: Debridement - Selective/Open Debridement -  Selective/Open N/A Wound Wound Pre-procedure 11:38 11:38 N/A Verification/Time Out Taken: Pain Control: Other Other N/A Tissue Debrided: [employer] N/A Level: Non-Viable Tissue Non-Viable Tissue N/A Debridement Area (sq cm): 0.3 0.24 N/A Instrument: Curette Curette N/A Bleeding: Moderate Moderate N/A Hemostasis Achieved: Pressure Pressure N/A Procedural Pain: 0 0 N/A Post Procedural Pain: 0 0 N/A Debridement Treatment Procedure was tolerated well Procedure was tolerated well N/A Response: Post Debridement 0.6x0.5x0.3 0.6x0.4x0.2 N/A Measurements L x W x D (cm) Post Debridement Volume: 0.071 0.038 N/A (cm) Periwound Skin Texture: No Abnormalities Noted No Abnormalities Noted Excoriation: No Induration: No Callus: No Crepitus: No Rash: No Scarring: No Periwound Skin Moisture: No Abnormalities Noted No Abnormalities Noted Maceration: No Dry/Scaly: No Periwound Skin Color: No Abnormalities Noted No Abnormalities Noted Atrophie Blanche: No Cyanosis: No Ecchymosis: No Erythema: No Hemosiderin Staining: No Mottled: No Pallor: No Rubor: No Erythema Location: N/A N/A N/A Temperature: No Abnormality No Abnormality No Abnormality Tenderness on Palpation: Yes Yes No Wound Preparation: Ulcer Cleansing: Ulcer Cleansing: Ulcer Cleansing: Other: soap Rinsed/Irrigated with Saline, Rinsed/Irrigated with Saline, and water Other: soap and water Other: soap and water Topical Anesthetic Applied: Topical Anesthetic Applied: Topical Anesthetic Applied: Other: lidocaine 4% Other: lidocaine 4% Other: lidocaine 4% Procedures Performed: Debridement Debridement N/A Wound Number: [id number]  Photos: [name], [name] ([id number]) Wound Location: Right [name] - Lateral, Proximal Right Malleolus - Lateral, Right Malleolus - Lateral Distal Wounding Event: Gradually Appeared Gradually Appeared Gradually Appeared Primary Etiology: Arterial Insufficiency Ulcer Arterial Insufficiency Ulcer  Arterial Insufficiency Ulcer Comorbid History: Congestive Heart Failure, Congestive Heart Failure, Congestive Heart Failure, Coronary Artery Disease, Coronary Artery Disease, Coronary Artery Disease, Hypertension, Myocardial Hypertension, Myocardial Hypertension, Myocardial Infarction, Osteoarthritis Infarction, Osteoarthritis Infarction, Osteoarthritis Date Acquired: 04/08/2017 01/15/2017 03/23/2017 [name] of Treatment: 0 0 0 Wound Status: Open Open Open Clustered Quantity: N/A N/A N/A Measurements L x W x D 0.5x0.5x0.3 0.5x0.6x0.2 0.2x0.2x0.1 (cm) Area (cm) : 0.196 0.236 0.031 Volume (cm) : 0.059 0.047 0.003 % Reduction in Area: -532.30% -232.40% N/A % Reduction in Volume: -1866.70% -571.40% N/A Classification: Full Thickness Without Full Thickness Without Full Thickness Without Exposed Support Structures Exposed Support Structures Exposed Support Structures Exudate Amount: Large None Present Medium Exudate Type: Sanguinous N/A Sanguinous Exudate Color: red N/A red Wound Margin: Flat and Intact Flat and Intact Flat and Intact Granulation Amount: Large (67-100%) Medium (34-66%) None Present (0%) Granulation Quality: Red Red N/A Necrotic Amount: None Present (0%) Medium (34-66%) Large (67-100%) Necrotic Tissue: N/A Eschar, Adherent [place] Exposed Structures: Fascia: No Fascia: No Fat Layer (Subcutaneous Fat Layer (Subcutaneous Fat Layer (Subcutaneous Tissue) Exposed: Yes Tissue) Exposed: No Tissue) Exposed: No Fascia: No Tendon: No Tendon: No Tendon: No Muscle: No Muscle: No Muscle: No Joint: No Joint: No Joint: No Bone: No Bone: No Bone: No Epithelialization: None None None Debridement: Debridement - Selective/Open N/A Debridement - Excisional Wound Pre-procedure 11:38 N/A 11:38 Verification/Time Out Taken: Pain Control: Other N/A Other Tissue Debrided: Slough N/A Subcutaneous, Slough Level:  Non-Viable Tissue N/A Skin/Subcutaneous Tissue Debridement  Area (sq cm): 0.25 N/A 0.04 Instrument: Curette N/A Curette Bleeding: Moderate N/A Moderate Hemostasis Achieved: Pressure N/A Pressure Procedural Pain: 0 N/A 0 Post Procedural Pain: 0 N/A 0 Debridement Treatment Procedure was tolerated well N/A Procedure was tolerated well Response: Heal, Izea E. ([id number]) Post Debridement 0.5x0.5x0.3 N/A 0.2x0.2x0.1 Measurements L x W x D (cm) Post Debridement Volume: 0.059 N/A 0.003 (cm) Periwound Skin Texture: No Abnormalities Noted No Abnormalities Noted No Abnormalities Noted Periwound Skin Moisture: No Abnormalities Noted No Abnormalities Noted No Abnormalities Noted Periwound Skin Color: Erythema: Yes Erythema: Yes No Abnormalities Noted Erythema Location: Circumferential Circumferential N/A Temperature: No Abnormality No Abnormality N/A Tenderness on Palpation: Yes Yes No Wound Preparation: Ulcer Cleansing: Ulcer Cleansing: Ulcer Cleansing: Rinsed/Irrigated with Saline, Rinsed/Irrigated with Saline, Rinsed/Irrigated with Saline Other: soap and water Other: soap and water Topical Anesthetic Applied: Topical Anesthetic Applied: Topical Anesthetic Applied: Other: lidocaine 4% Other: lidocaine 4% Other: lidocaine 4% Procedures Performed: Debridement N/A Debridement Treatment Notes Wound #1 (Right, Proximal, Medial Lower Leg) 1. Cleansed with: Clean wound with Normal Saline Cleanse wound with antibacterial soap and water 2. Anesthetic Topical Lidocaine 4% cream to wound bed prior to debridement 4. Dressing Applied: Prisma Ag 5. Secondary Dressing Applied ABD Pad 7. Secured with 3 Layer Compression System - Right Lower Extremity Wound #2 (Right, Distal, Medial Lower Leg) 1. Cleansed with: Clean wound with Normal Saline Cleanse wound with antibacterial soap and water 2. Anesthetic Topical Lidocaine 4% cream to wound bed prior to debridement 4. Dressing Applied: Prisma Ag 5. Secondary Dressing Applied ABD Pad 7.  Secured with 3 Layer Compression System - Right Lower Extremity Wound #3 (Right, Anterior [name]) 1. Cleansed with: Clean wound with Normal Saline Cleanse wound with antibacterial soap and water 2. Anesthetic Topical Lidocaine 4% cream to wound bed prior to debridement 4. Dressing Applied: Prisma Ag [name], [name]. ([id number]) 5. Secondary Dressing Applied ABD Pad 7. Secured with 3 Layer Compression System - Right Lower Extremity Wound #4 (Right, Proximal, Lateral [name]) 1. Cleansed with: Clean wound with Normal Saline Cleanse wound with antibacterial soap and water 2. Anesthetic Topical Lidocaine 4% cream to wound bed prior to debridement 4. Dressing Applied: Prisma Ag 5. Secondary Dressing Applied ABD Pad 7. Secured with 3 Layer Compression System - Right Lower Extremity Wound #5 (Right, Distal, Lateral Malleolus) 1. Cleansed with: Clean wound with Normal Saline Cleanse wound with antibacterial soap and water 2. Anesthetic Topical Lidocaine 4% cream to wound bed prior to debridement 4. Dressing Applied: Prisma Ag 5. Secondary Dressing Applied ABD Pad 7. Secured with 3 Layer Compression System - Right Lower Extremity Wound #6 (Right, Lateral Malleolus) 1. Cleansed with: Clean wound with Normal Saline Cleanse wound with antibacterial soap and water 2. Anesthetic Topical Lidocaine 4% cream to wound bed prior to debridement 4. Dressing Applied: Prisma Ag 5. Secondary Dressing Applied ABD Pad 7. Secured with 3 Layer Compression System - Right Lower Extremity Electronic Signature(s) Signed: 04/08/2017 4:49:21 PM By: Linton Ham MD Entered By: Linton Ham on 04/08/2017 16:23:46 [name], [name] ([id number]) -------------------------------------------------------------------------------- Multi-Disciplinary Care Plan Details Patient Name: [name] Date of Service: 04/08/2017 9:45 AM Medical Record Number: [id number] Patient Account Number:  [id number] Date of Birth/Sex: [birth year] (77 y.o. Male) Treating RN: [name] Primary Care Anees Vanecek: [name] Other Clinician: Referring Syler Norcia: [name] Treating Kytzia Gienger/Extender: [name] in Treatment: 0 Active Inactive ` Abuse / Safety / Falls / Self Care Management Nursing Diagnoses: History  of Falls Goals: Patient will remain injury free related to falls Date Initiated: 04/08/2017 Target Resolution Date: 05/08/2017 Goal Status: Active Interventions: Assess fall risk on admission and as needed Notes: ` Orientation to the Wound Care Program Nursing Diagnoses: Knowledge deficit related to the wound healing center program Goals: Patient/caregiver will verbalize understanding of the [place] Program Date Initiated: 04/08/2017 Target Resolution Date: 05/08/2017 Goal Status: Active Interventions: Provide education on orientation to the wound center Notes: ` Soft Tissue Infection Nursing Diagnoses: Impaired tissue integrity Potential for infection: soft tissue Goals: Patient will remain free of wound infection Date Initiated: 04/08/2017 Target Resolution Date: 05/08/2017 Goal Status: Active [name], [name] ([id number]) Interventions: Assess signs and symptoms of infection every visit Notes: ` Wound/Skin Impairment Nursing Diagnoses: Impaired tissue integrity Goals: Ulcer/skin breakdown will heal within 14 [name] Date Initiated: 04/08/2017 Target Resolution Date: 07/20/2017 Goal Status: Active Interventions: Assess patient/caregiver ability to perform ulcer/skin care regimen upon admission and as needed Provide education on ulcer and skin care Treatment Activities: Topical wound management initiated : 04/08/2017 Notes: Electronic Signature(s) Signed: 04/13/2017 9:08:37 AM By: [name], [name] on 04/08/2017 11:31:46 [name], [name]  ([id number]) -------------------------------------------------------------------------------- Pain Assessment Details Patient Name: [name] Date of Service: 04/08/2017 9:45 AM Medical Record Number: [id number] Patient Account Number: [id number] Date of Birth/Sex: Apr 10, 1940 (77 y.o. Male) Treating RN: [name] Primary Care Abednego Yeates: [name] Other Clinician: Referring Zaevion Parke: [name] Treating Neveah Bang/Extender: [name] in Treatment: 0 Active Problems Location of Pain Severity and Description of Pain Patient Has Paino No Site Locations Pain Management and Medication Current Pain Management: Electronic Signature(s) Signed: 04/09/2017 4:32:30 PM By: [name] Entered By: [name] on 04/08/2017 09:59:07 [name], [name] ([id number]) -------------------------------------------------------------------------------- Patient/Caregiver Education Details Patient Name: [name] Date of Service: 04/08/2017 9:45 AM Medical Record Number: [id number] Patient Account Number: [id number] Date of Birth/Gender: 04/15/1940 (77 y.o. Male) Treating RN: [name] Primary Care Physician: [name] Other Clinician: Referring Physician: Marcelle [name] Treating Physician/Extender: [name] in Treatment: 0 Education Assessment Education Provided To: Patient and Caregiver Education Topics Provided Venous: Handouts: Other: leg elevation and wrap precautions Methods: Explain/Verbal Responses: State content correctly Electronic Signature(s) Signed: 04/08/2017 4:21:37 PM By: [name] Entered By: [name] on 04/08/2017 12:18:29 [name], [name] ([id number]) -------------------------------------------------------------------------------- Wound Assessment Details Patient Name: [name] Date of Service: 04/08/2017 9:45 AM Medical Record Number: [id number] Patient Account Number:  [id number] Date of Birth/Sex: Dec 18, 1940 (77 y.o. Male) Treating RN: Carolyne Fiscal, Debi Primary Care Deshun Sedivy: [name] Other Clinician: Referring Venessa Wickham: [name] Treating Iasiah Ozment/Extender: [name] in Treatment: 0 Wound Status Wound Number: 1 Primary Arterial Insufficiency Ulcer Etiology: Wound Location: Right Lower Leg - Medial, Proximal Wound Open Wounding Event: Gradually Appeared Status: Date Acquired: 01/15/2017 Comorbid Congestive Heart Failure, Coronary Artery [name] Of Treatment: 0 History: Disease, Hypertension, Myocardial Infarction, Clustered Wound: No Osteoarthritis Photos Photo Uploaded By: [name] on 04/08/2017 12:38:10 Wound Measurements Length: (cm) 0.6 Width: (cm) 0.5 Depth: (cm) 0.1 Clustered Quantity: 9 Area: (cm) 0.236 Volume: (cm) 0.024 % Reduction in Area: 99.9% % Reduction in Volume: 99.9% Epithelialization: None Tunneling: No Undermining: No Wound Description Full Thickness Without Exposed Support Classification: Structures Wound Margin: Distinct, outline attached Exudate Large Amount: Exudate Type: Sanguinous Exudate Color: red Foul Odor After Cleansing: No Slough/Fibrino Yes Wound Bed Granulation Amount: Medium (34-66%) Exposed Structure Granulation Quality: Red Fascia Exposed: No Necrotic Amount:  Medium (34-66%) Fat Layer (Subcutaneous Tissue) Exposed: No Necrotic Quality: Eschar, Adherent Slough Tendon Exposed: No Muscle Exposed: No Joint Exposed: No Tamas, Tamir E. ([id number]) Bone Exposed: No Periwound Skin Texture Texture Color No Abnormalities Noted: No No Abnormalities Noted: No Moisture Temperature / Pain No Abnormalities Noted: No Temperature: No Abnormality Tenderness on Palpation: Yes Wound Preparation Ulcer Cleansing: Rinsed/Irrigated with Saline, Other: soap and water, Topical Anesthetic Applied: Other: lidocaine 4%, Treatment Notes Wound #1 (Right, Proximal,  Medial Lower Leg) 1. Cleansed with: Clean wound with Normal Saline Cleanse wound with antibacterial soap and water 2. Anesthetic Topical Lidocaine 4% cream to wound bed prior to debridement 4. Dressing Applied: Prisma Ag 5. Secondary Dressing Applied ABD Pad 7. Secured with 3 Layer Compression System - Right Lower Extremity Electronic Signature(s) Signed: 04/09/2017 4:32:30 PM By: [name] Signed: 04/13/2017 9:08:37 AM By: [name], [name] on 04/08/2017 11:48:22 [name], [name] ([id number]) -------------------------------------------------------------------------------- Wound Assessment Details Patient Name: [name] Date of Service: 04/08/2017 9:45 AM Medical Record Number: [id number] Patient Account Number: [id number] Date of Birth/Sex: 25-Oct-1940 (77 y.o. Male) Treating RN: [name] Primary Care Tennyson Wacha: [name] Other Clinician: Referring Maimouna Rondeau: [name] Treating Charmeka Freeburg/Extender: [name] in Treatment: 0 Wound Status Wound Number: 2 Primary Arterial Insufficiency Ulcer Etiology: Wound Location: Right Lower Leg - Medial, Distal Wound Open Wounding Event: Gradually Appeared Status: Date Acquired: 01/15/2017 Comorbid Congestive Heart Failure, Coronary Artery [name] Of Treatment: 0 History: Disease, Hypertension, Myocardial Infarction, Clustered Wound: No Osteoarthritis Photos Photo Uploaded By: [name] on 04/08/2017 12:38:10 Wound Measurements Length: (cm) 0.6 Width: (cm) 0.4 Depth: (cm) 0.2 Clustered Quantity: 5 Area: (cm) 0.188 Volume: (cm) 0.038 % Reduction in Area: 99% % Reduction in Volume: 98% Epithelialization: None Tunneling: No Undermining: No Wound Description Full Thickness Without Exposed Support Classification: Structures Wound Margin: Flat and Intact Exudate Large Amount: Exudate Type: Sanguinous Exudate Color: red Foul  Odor After Cleansing: No Slough/Fibrino Yes Wound Bed Granulation Amount: Medium (34-66%) Exposed Structure Granulation Quality: Red Fascia Exposed: No Necrotic Amount: Medium (34-66%) Fat Layer (Subcutaneous Tissue) Exposed: No Tendon Exposed: No Muscle Exposed: No Joint Exposed: No Mcintyre, Daltin E. ([id number]) Bone Exposed: No Periwound Skin Texture Texture Color No Abnormalities Noted: No No Abnormalities Noted: No Moisture Temperature / Pain No Abnormalities Noted: No Temperature: No Abnormality Tenderness on Palpation: Yes Wound Preparation Ulcer Cleansing: Rinsed/Irrigated with Saline, Other: soap and water, Topical Anesthetic Applied: Other: lidocaine 4%, Treatment Notes Wound #2 (Right, Distal, Medial Lower Leg) 1. Cleansed with: Clean wound with Normal Saline Cleanse wound with antibacterial soap and water 2. Anesthetic Topical Lidocaine 4% cream to wound bed prior to debridement 4. Dressing Applied: Prisma Ag 5. Secondary Dressing Applied ABD Pad 7. Secured with 3 Layer Compression System - Right Lower Extremity Electronic Signature(s) Signed: 04/09/2017 4:32:30 PM By: [name] Signed: 04/13/2017 9:08:37 AM By: [name], [name] on 04/08/2017 11:48:59 Cicero, [name] ([id number]) -------------------------------------------------------------------------------- Wound Assessment Details Patient Name: [name] Date of Service: 04/08/2017 9:45 AM Medical Record Number: [id number] Patient Account Number: [id number] Date of Birth/Sex: [birth year] (77 y.o. Male) Treating RN: [name] Primary Care Geana Walts: [name] Other Clinician: Referring Leonidus Rowand: [name] Treating Ryo Klang/Extender: [name] in Treatment: 0 Wound Status Wound Number: 3 Primary Arterial Insufficiency Ulcer Etiology: Wound Location: Right [name] - Anterior Wound Open Wounding Event:  Gradually Appeared  Status: Date Acquired: 01/15/2017 Comorbid Congestive Heart Failure, Coronary Artery [name] Of Treatment: 0 History: Disease, Hypertension, Myocardial Infarction, Clustered Wound: No Osteoarthritis Photos Photo Uploaded By: [name] on 04/08/2017 12:38:53 Wound Measurements Length: (cm) 0.9 Width: (cm) 0.7 Depth: (cm) 0.3 Area: (cm) 0.495 Volume: (cm) 0.148 % Reduction in Area: % Reduction in Volume: Epithelialization: None Tunneling: No Undermining: No Wound Description Full Thickness Without Exposed Support Classification: Structures Wound Margin: Flat and Intact Exudate Large Amount: Exudate Type: Sanguinous Exudate Color: red Foul Odor After Cleansing: No Slough/Fibrino Yes Wound Bed Granulation Amount: Medium (34-66%) Exposed Structure Granulation Quality: Red Fascia Exposed: No Necrotic Amount: Medium (34-66%) Fat Layer (Subcutaneous Tissue) Exposed: Yes Necrotic Quality: Eschar, Adherent Slough Tendon Exposed: No Muscle Exposed: No Joint Exposed: No Bone Exposed: No Keehan, Kyro E. ([id number]) Periwound Skin Texture Texture Color No Abnormalities Noted: No No Abnormalities Noted: No Callus: No Atrophie Blanche: No Crepitus: No Cyanosis: No Excoriation: No Ecchymosis: No Induration: No Erythema: No Rash: No Hemosiderin Staining: No Scarring: No Mottled: No Pallor: No Moisture Rubor: No No Abnormalities Noted: No Dry / Scaly: No Temperature / Pain Maceration: No Temperature: No Abnormality Wound Preparation Ulcer Cleansing: Other: soap and water, Topical Anesthetic Applied: Other: lidocaine 4%, Electronic Signature(s) Signed: 04/13/2017 9:08:37 AM By: [name], [name] on 04/08/2017 11:50:05 [name], [name] ([id number]) -------------------------------------------------------------------------------- Wound Assessment Details Patient Name: [name] Date of Service: 04/08/2017 9:45 AM Medical Record Number: [id number] Patient Account Number: [id number] Date of Birth/Sex: [birth year] (77 y.o. Male) Treating RN: Carolyne Fiscal, Debi Primary Care Neliah Cuyler: [name] Other Clinician: Referring Pratik Dalziel: [name] Treating Philemon Riedesel/Extender: [name] in Treatment: 0 Wound Status Wound Number: 4 Primary Arterial Insufficiency Ulcer Etiology: Wound Location: Right [name] - Lateral, Proximal Wound Open Wounding Event: Gradually Appeared Status: Date Acquired: 04/08/2017 Comorbid Congestive Heart Failure, Coronary Artery [name] Of Treatment: 0 History: Disease, Hypertension, Myocardial Infarction, Clustered Wound: No Osteoarthritis Photos Photo Uploaded By: [name] on 04/08/2017 12:38:53 Wound Measurements Length: (cm) 0.5 Width: (cm) 0.5 Depth: (cm) 0.3 Area: (cm) 0.196 Volume: (cm) 0.059 % Reduction in Area: -532.3% % Reduction in Volume: -1866.7% Epithelialization: None Tunneling: No Undermining: No Wound Description Full Thickness Without Exposed Support Foul O Classification: Structures Slough Wound Margin: Flat and Intact Exudate Large Amount: Exudate Type: Sanguinous Exudate Color: red dor After Cleansing: No /Fibrino No Wound Bed Granulation Amount: Large (67-100%) Exposed Structure Granulation Quality: Red Fascia Exposed: No Necrotic Amount: None Present (0%) Fat Layer (Subcutaneous Tissue) Exposed: No Tendon Exposed: No Muscle Exposed: No Joint Exposed: No Bone Exposed: No Rikard, Audley E. ([id number]) Periwound Skin Texture Texture Color No Abnormalities Noted: No No Abnormalities Noted: No Erythema: Yes Moisture Erythema Location: Circumferential No Abnormalities Noted: No Temperature / Pain Temperature: No Abnormality Tenderness on Palpation: Yes Wound Preparation Ulcer Cleansing: Rinsed/Irrigated with Saline, Other: soap and water, Topical Anesthetic  Applied: Other: lidocaine 4%, Electronic Signature(s) Signed: 04/09/2017 4:32:30 PM By: [name] Signed: 04/13/2017 9:08:37 AM By: [name], [name] on 04/08/2017 11:50:44 [name], [name] ([id number]) -------------------------------------------------------------------------------- Wound Assessment Details Patient Name: [name] Date of Service: 04/08/2017 9:45 AM Medical Record Number: [id number] Patient Account Number: [id number] Date of Birth/Sex: 07-Aug-1940 (77 y.o. Male) Treating RN: [name] Primary Care [employer]aya Mcjunkins: [name] Other Clinician: Referring Doniesha Landau: [name] Treating Chiniqua Kilcrease/Extender: [name] in Treatment: 0 Wound Status Wound Number:  5 Primary Arterial Insufficiency Ulcer Etiology: Wound Location: Right Malleolus - Lateral, Distal Wound Open Wounding Event: Gradually Appeared Status: Date Acquired: 01/15/2017 Comorbid Congestive Heart Failure, Coronary Artery [name] Of Treatment: 0 History: Disease, Hypertension, Myocardial Infarction, Clustered Wound: No Osteoarthritis Photos Photo Uploaded By: [name] on 04/08/2017 12:39:21 Wound Measurements Length: (cm) 0.5 Width: (cm) 0.6 Depth: (cm) 0.2 Area: (cm) 0.236 Volume: (cm) 0.047 % Reduction in Area: -232.4% % Reduction in Volume: -571.4% Epithelialization: None Tunneling: No Undermining: No Wound Description Full Thickness Without Exposed Support Classification: Structures Wound Margin: Flat and Intact Exudate None Present Amount: Foul Odor After Cleansing: No Slough/Fibrino Yes Wound Bed Granulation Amount: Medium (34-66%) Exposed Structure Granulation Quality: Red Fascia Exposed: No Necrotic Amount: Medium (34-66%) Fat Layer (Subcutaneous Tissue) Exposed: No Necrotic Quality: Eschar, Adherent Slough Tendon Exposed: No Muscle Exposed: No Joint Exposed: No Bone Exposed:  No Periwound Skin Texture Port, Anirudh E. ([id number]) Texture Color No Abnormalities Noted: No No Abnormalities Noted: No Erythema: Yes Moisture Erythema Location: Circumferential No Abnormalities Noted: No Temperature / Pain Temperature: No Abnormality Tenderness on Palpation: Yes Wound Preparation Ulcer Cleansing: Rinsed/Irrigated with Saline, Other: soap and water, Topical Anesthetic Applied: Other: lidocaine 4%, Electronic Signature(s) Signed: 04/09/2017 4:32:30 PM By: [name] Signed: 04/13/2017 9:08:37 AM By: [name], [name] on 04/08/2017 11:51:20 Jauregui, [name] ([id number]) -------------------------------------------------------------------------------- Wound Assessment Details Patient Name: [name] Date of Service: 04/08/2017 9:45 AM Medical Record Number: [id number] Patient Account Number: [id number] Date of Birth/Sex: 11/21/1940 (77 y.o. Male) Treating RN: [name] Primary Care Sema Stangler: [name] Other Clinician: Referring Micheil Klaus: [name] Treating Alija Riano/Extender: [name] in Treatment: 0 Wound Status Wound Number: 6 Primary Arterial Insufficiency Ulcer Etiology: Wound Location: Right Malleolus - Lateral Wound Open Wounding Event: Gradually Appeared Status: Date Acquired: 03/23/2017 Comorbid Congestive Heart Failure, Coronary Artery [name] Of Treatment: 0 History: Disease, Hypertension, Myocardial Infarction, Clustered Wound: No Osteoarthritis Photos Photo Uploaded By: [name] on 04/08/2017 12:39:22 Wound Measurements Length: (cm) 0.2 Width: (cm) 0.2 Depth: (cm) 0.1 Area: (cm) 0.031 Volume: (cm) 0.003 % Reduction in Area: % Reduction in Volume: Epithelialization: None Tunneling: No Undermining: No Wound Description Full Thickness Without Exposed Support Classification: Structures Wound Margin: Flat and  Intact Exudate Medium Amount: Exudate Type: Sanguinous Exudate Color: red Foul Odor After Cleansing: No Slough/Fibrino Yes Wound Bed Granulation Amount: None Present (0%) Exposed Structure Necrotic Amount: Large (67-100%) Fascia Exposed: No Necrotic Quality: Adherent Slough Fat Layer (Subcutaneous Tissue) Exposed: Yes Tendon Exposed: No Muscle Exposed: No Joint Exposed: No Bone Exposed: No Glauber, Aeric E. ([id number]) Periwound Skin Texture Texture Color No Abnormalities Noted: No No Abnormalities Noted: No Moisture No Abnormalities Noted: No Wound Preparation Ulcer Cleansing: Rinsed/Irrigated with Saline Topical Anesthetic Applied: Other: lidocaine 4%, Treatment Notes Wound #6 (Right, Lateral Malleolus) 1. Cleansed with: Clean wound with Normal Saline Cleanse wound with antibacterial soap and water 2. Anesthetic Topical Lidocaine 4% cream to wound bed prior to debridement 4. Dressing Applied: Prisma Ag 5. Secondary Dressing Applied ABD Pad 7. Secured with 3 Layer Compression System - Right Lower Extremity Electronic Signature(s) Signed: 04/13/2017 9:08:37 AM By: [name], [name] on 04/08/2017 11:52:50 Suddreth, [name] ([id number]) -------------------------------------------------------------------------------- Vitals Details Patient Name: [name] Date of Service: 04/08/2017 9:45 AM Medical Record Number: [id number] Patient Account Number: [id number] Date of Birth/Sex: July 04, 1940 (77 y.o. Male) Treating RN: Carolyne Fiscal, Debi  Primary Care Bao Bazen: [name] Other Clinician: Referring Stormy Sabol: [name] Treating Khiara Shuping/Extender: [name] in Treatment: 0 Vital Signs Time Taken: 09:59 Pulse (bpm): 74 Height (in): 68 Respiratory Rate (breaths/min): 16 Source: Stated Blood Pressure (mmHg): 107/55 Reference Range: 80 - 120 mg / dl Notes was unable to get temp orally  or axillary made Dr. Dellia Nims aware of same Electronic Signature(s) Signed: 04/09/2017 4:32:30 PM By: [name] Entered By: [name] on 04/08/2017 10:03:49

## 2017-04-13 NOTE — Progress Notes (Signed)
Jermaine, [name] ([id number]) Visit Report for 04/08/2017 Debridement Details Patient Name: [name], [name] Date of Service: 04/08/2017 9:45 AM Medical Record Number: [id number] Patient Account Number: [id number] Date of Birth/Sex: 1940/11/03 (77 y.o. Male) Treating RN: Cornell Barman Primary Care Provider: Cyndi Bender Other Clinician: Referring Provider: Marcelle Overlie Treating Provider/Extender: Ricard Dillon Weeks in Treatment: 0 Debridement Performed for Wound #1 Right,Proximal,Medial Lower Leg Assessment: Performed By: Physician Ricard Dillon, MD Debridement Type: Debridement Severity of Tissue Pre Fat layer exposed Debridement: Pre-procedure Verification/Time Yes - 11:38 Out Taken: Start Time: 11:39 Pain Control: Other : lidocaine 4% Total Area Debrided (L x W): 0.6 (cm) x 0.5 (cm) = 0.3 (cm) Tissue and other material Viable, Non-Viable, Slough, Slough debrided: Level: Non-Viable Tissue Debridement Description: Selective/Open Wound Instrument: Curette Bleeding: Moderate Hemostasis Achieved: Pressure End Time: 11:54 Procedural Pain: 0 Post Procedural Pain: 0 Response to Treatment: Procedure was tolerated well Post Debridement Measurements of Total Wound Length: (cm) 0.6 Width: (cm) 0.5 Depth: (cm) 0.3 Volume: (cm) 0.071 Character of Wound/Ulcer Post Debridement: Stable Severity of Tissue Post Debridement: Fat layer exposed Post Procedure Diagnosis Same as Pre-procedure Electronic Signature(s) Signed: 04/08/2017 4:49:21 PM By: Linton Ham MD Signed: 04/13/2017 9:08:37 AM By: Gretta Cool, BSN, RN, CWS, Kim RN, BSN Entered By: Gretta Cool, BSN, RN, CWS, Kim on 04/08/2017 11:55:02 Junious, [name] ([id number]) -------------------------------------------------------------------------------- Debridement Details Patient Name: [name] Date of Service: 04/08/2017 9:45 AM Medical Record Number: [id number] Patient Account Number: [id number] Date of  Birth/Sex: December 19, 1940 (77 y.o. Male) Treating RN: Cornell Barman Primary Care Provider: Cyndi Bender Other Clinician: Referring Provider: Marcelle Overlie Treating Provider/Extender: Ricard Dillon Weeks in Treatment: 0 Debridement Performed for Wound #2 Right,Distal,Medial Lower Leg Assessment: Performed By: Physician Ricard Dillon, MD Debridement Type: Debridement Severity of Tissue Pre Fat layer exposed Debridement: Pre-procedure Verification/Time Yes - 11:38 Out Taken: Start Time: 11:39 Pain Control: Other : lidocaine 4% Total Area Debrided (L x W): 0.6 (cm) x 0.4 (cm) = 0.24 (cm) Tissue and other material Viable, Non-Viable, Slough, Slough debrided: Level: Non-Viable Tissue Debridement Description: Selective/Open Wound Instrument: Curette Bleeding: Moderate Hemostasis Achieved: Pressure End Time: 11:54 Procedural Pain: 0 Post Procedural Pain: 0 Response to Treatment: Procedure was tolerated well Post Debridement Measurements of Total Wound Length: (cm) 0.6 Width: (cm) 0.4 Depth: (cm) 0.2 Volume: (cm) 0.038 Character of Wound/Ulcer Post Debridement: Stable Severity of Tissue Post Debridement: Fat layer exposed Post Procedure Diagnosis Same as Pre-procedure Electronic Signature(s) Signed: 04/08/2017 4:49:21 PM By: Linton Ham MD Signed: 04/13/2017 9:08:37 AM By: Gretta Cool, BSN, RN, CWS, Kim RN, BSN Entered By: Gretta Cool, BSN, RN, CWS, Kim on 04/08/2017 11:55:44 Hortman, [name] ([id number]) -------------------------------------------------------------------------------- Debridement Details Patient Name: [name] Date of Service: 04/08/2017 9:45 AM Medical Record Number: [id number] Patient Account Number: [id number] Date of Birth/Sex: November 10, 1940 (77 y.o. Male) Treating RN: Cornell Barman Primary Care Provider: Cyndi Bender Other Clinician: Referring Provider: Marcelle Overlie Treating Provider/Extender: Ricard Dillon Weeks in Treatment:  0 Debridement Performed for Wound #4 Right,Proximal,Lateral [name] Assessment: Performed By: Physician Ricard Dillon, MD Debridement Type: Debridement Severity of Tissue Pre Fat layer exposed Debridement: Pre-procedure Verification/Time Yes - 11:38 Out Taken: Start Time: 11:39 Pain Control: Other : lidocaine 4% Total Area Debrided (L x W): 0.5 (cm) x 0.5 (cm) = 0.25 (cm) Tissue and other material Viable, Non-Viable, Slough, Slough debrided: Level: Non-Viable Tissue Debridement Description: Selective/Open Wound Instrument: Curette Bleeding: Moderate Hemostasis Achieved: Pressure End Time: 11:54 Procedural Pain: 0 Post Procedural Pain: 0 Response  to Treatment: Procedure was tolerated well Post Debridement Measurements of Total Wound Length: (cm) 0.5 Width: (cm) 0.5 Depth: (cm) 0.3 Volume: (cm) 0.059 Character of Wound/Ulcer Post Debridement: Stable Severity of Tissue Post Debridement: Fat layer exposed Post Procedure Diagnosis Same as Pre-procedure Electronic Signature(s) Signed: 04/08/2017 4:49:21 PM By: Linton Ham MD Signed: 04/13/2017 9:08:37 AM By: Gretta Cool, BSN, RN, CWS, Kim RN, BSN Entered By: Gretta Cool, BSN, RN, CWS, Kim on 04/08/2017 11:56:19 Vieyra, [name] ([id number]) -------------------------------------------------------------------------------- Debridement Details Patient Name: [name] Date of Service: 04/08/2017 9:45 AM Medical Record Number: [id number] Patient Account Number: [id number] Date of Birth/Sex: Sep 10, 1940 (77 y.o. Male) Treating RN: Cornell Barman Primary Care Provider: Cyndi Bender Other Clinician: Referring Provider: Marcelle Overlie Treating Provider/Extender: Ricard Dillon Weeks in Treatment: 0 Debridement Performed for Wound #6 Right,Lateral Malleolus Assessment: Performed By: Physician Ricard Dillon, MD Debridement Type: Debridement Severity of Tissue Pre Fat layer exposed Debridement: Pre-procedure  Verification/Time Yes - 11:38 Out Taken: Start Time: 11:39 Pain Control: Other : lidocaine 4% Total Area Debrided (L x W): 0.2 (cm) x 0.2 (cm) = 0.04 (cm) Tissue and other material Viable, Non-Viable, Slough, Subcutaneous, Slough debrided: Level: Skin/Subcutaneous Tissue Debridement Description: Excisional Instrument: Curette Bleeding: Moderate Hemostasis Achieved: Pressure End Time: 11:54 Procedural Pain: 0 Post Procedural Pain: 0 Response to Treatment: Procedure was tolerated well Post Debridement Measurements of Total Wound Length: (cm) 0.2 Width: (cm) 0.2 Depth: (cm) 0.1 Volume: (cm) 0.003 Character of Wound/Ulcer Post Debridement: Stable Severity of Tissue Post Debridement: Fat layer exposed Post Procedure Diagnosis Same as Pre-procedure Electronic Signature(s) Signed: 04/08/2017 4:49:21 PM By: Linton Ham MD Signed: 04/13/2017 9:08:37 AM By: Gretta Cool, BSN, RN, CWS, Kim RN, BSN Entered By: Gretta Cool, BSN, RN, CWS, Kim on 04/08/2017 11:57:08 Barcenas, [name] ([id number]) -------------------------------------------------------------------------------- HPI Details Patient Name: [name] Date of Service: 04/08/2017 9:45 AM Medical Record Number: [id number] Patient Account Number: [id number] Date of Birth/Sex: April 17, 1940 (77 y.o. Male) Treating RN: Cornell Barman Primary Care Provider: Cyndi Bender Other Clinician: Referring Provider: Marcelle Overlie Treating Provider/Extender: Ricard Dillon Weeks in Treatment: 0 History of Present Illness HPI Description: 04/08/17; this is a complex 77 year old man referred here from [facility] and vascular. He had been referred there for bilateral lower extremity edema with ulcer formation predominantly on the right calf but also the right [name]. He had been receiving Unna boots bilaterally. The history here is long. He is not a diabetic however ICU looking through late 2018 he was worked up for chronic headaches,  elevated inflammatory markers including C-reactive protein and ESR. He went on to actually have a left temporal artery biopsy wasn't that was negative.he received about 6 weeks of high-dose prednisone 60 mg with improvement in his inflammatory markers. He was admitted to hospital in late November with ventricular tachycardia syncope. He has known ischemic cardiomyopathy. He was admitted in the hospital in mid December. Apparently this was precipitated by a syncopal spell falling out of his scooter while at [place]. There was ventricular arrhythmia. He has an implantable defibrillator and echocardiogram showed severe LV dysfunction with an EF of 20% and valvular regurgitations including mild AR, moderate MR. There was no stenosis. He ruled in for a non-ST elevation MI in the setting of V. tach.his wife states that sometime during this hospitalization she noted multiple areas of skin change on the right lower calf which became evident just after he left the hospital. He was back in hospital in February with acute renal failure hyponatremia. This responded to  fluid resuscitation.. Interestingly I can't see much description of his right leg at that point in time.he was followed by Dr. Nehemiah Massed of dermatology for the necrotic wounds on his right leg. Apparently a biopsy was planned at one point but not done although in some notes that suggests it was. I cannot see these results area He was noted to have a lot of edema. Was treated with bilateral Unna boots edges really helped with the swelling they have been using Bactroban to small open areas predominantly on the right anterior lower leg His history is complicated by the fact that he has rheumatoid arthritis followed by rheumatology. He is followed by neurology for disabling headaches. At one point this was felt to be giant cell arteritis although a left temporal biopsy was apparently negative. He was given a prolonged course of prednisone at 60 mg  which managed his sedimentation rates but apparently did not prove improve the headaches. This is been tapered to off on by rheumatology on 03/13/17 Vascular had plans to do a venous reflux workup as well as arterial studies in May. They also wanted to get him a lymphedema pump. As mentioned he's been using bacitracin under Unna boot wraps to both lower legs Electronic Signature(s) Signed: 04/08/2017 4:49:21 PM By: Linton Ham MD Entered By: Linton Ham on 04/08/2017 16:36:00 Krotz, [name] ([id number]) -------------------------------------------------------------------------------- Physical Exam Details Patient Name: [name] Date of Service: 04/08/2017 9:45 AM Medical Record Number: [id number] Patient Account Number: [id number] Date of Birth/Sex: 02/12/1940 (77 y.o. Male) Treating RN: Cornell Barman Primary Care Provider: Cyndi Bender Other Clinician: Referring Provider: Marcelle Overlie Treating Provider/Extender: Ricard Dillon Weeks in Treatment: 0 Constitutional Patient is hypertensive.. Pulse regular and within target range for patient.[name] Respirations regular, non-labored and within target range.. Temperature is normal and within the target range for the patient.[name] appears in no distress. Eyes Conjunctivae clear. No discharge. Respiratory Respiratory effort is easy and symmetric bilaterally. Rate is normal at rest and on room air.. Bilateral breath sounds are clear and equal in all lobes with no wheezes, rales or rhonchi.. Cardiovascular 3/6 holosystolic murmur at the PMI radiating to the sella compatible with mitral regurgitation. JVP was not elevated. even had trouble feeling this man's femoral pulse although he wasn't undressed. I could not feel the popliteal pulse. Pedal pulses absent bilaterally.. has mild edema in both legs. This is apparently a major improvement from December according to his wife. Gastrointestinal (GI) Abdomen is soft and non-distended  without masses or tenderness. Bowel sounds active in all quadrants.. Lymphatic none palpable in the popliteal or inguinal area bilaterally. Integumentary (Hair, Skin) multiple small areas on the right calf both anteriorly and posteriorly. Almost all the areas on the right posterior calf for fully closed. There are several that area of adherent black eschar and nonviable subcutaneous tissue requiring debridement anteriorly.. Neurological reflexes reduced at the knee jerks and ankle jerks. Left plantar flexor right equivocal to extensor. Psychiatric No evidence of depression, anxiety, or agitation. Calm, cooperative, and communicative. Appropriate interactions and affect.. Notes wound exam; pupils skin areas on the right anterior right posterior calf. There is an area on the left lateral [name]. All the areas on the left posterior calf are closed. Anteriorly there are small punched out areas with thick eschar and necrotic debris under the eschar. I debrided 6 of these 2 revealed generally pretty good-looking granulation underneath. There is no evidence of surrounding infection. The cause of these wounds is really not clear. His wife states  that they thought this was secondary to edema and coexistent infection I am doubtful that is true. He could have venous reflux disease and I'm sure he did have lymphedema although these do not look like the type of wounds that people traditionally get from this. I am concerned about the lack of peripheral pulses in his feet or his popliteal area vis-o-vis 4 layer compression Electronic Signature(s) Signed: 04/08/2017 4:49:21 PM By: Linton Ham MD Entered By: Linton Ham on 04/08/2017 16:40:24 Kwolek, [name] ([id number]) -------------------------------------------------------------------------------- Physician Orders Details Patient Name: [name] Date of Service: 04/08/2017 9:45 AM Medical Record Number: [id number] Patient Account Number:  [id number] Date of Birth/Sex: [birth year] (77 y.o. Male) Treating RN: Cornell Barman Primary Care Provider: Cyndi Bender Other Clinician: Referring Provider: Marcelle Overlie Treating Provider/Extender: Ricard Dillon Weeks in Treatment: 0 Verbal / Phone Orders: No Diagnosis Coding Wound Cleansing o Clean wound with Normal Saline. Anesthetic (add to Medication List) o Topical Lidocaine 4% cream applied to wound bed prior to debridement (In Clinic Only). Primary Wound Dressing o Silver Collagen - all wounds Secondary Dressing o ABD pad - over collegen, Unna paste to [place] at top Dressing Change Frequency o Change Dressing Monday, Wednesday, Friday Follow-up Appointments o Return Appointment in 1 week. Edema Control o 3 Layer Compression System - Right Ginger Blue Visits o Home Health Nurse may visit PRN to address patientos wound care needs. o FACE TO FACE ENCOUNTER: MEDICARE and MEDICAID PATIENTS: I certify that this patient is under my care and that I had a face-to-face encounter that meets the physician face-to-face encounter requirements with this patient on this date. The encounter with the patient was in whole or in part for the following MEDICAL CONDITION: (primary reason for [place]) MEDICAL NECESSITY: I certify, that based on my findings, NURSING services are a medically necessary home health service. HOME BOUND STATUS: I certify that my clinical findings support that this patient is homebound (i.e., Due to illness or injury, pt requires aid of supportive devices such as crutches, cane, wheelchairs, walkers, the use of special transportation or the assistance of another person to leave their place of residence. There is a normal inability to leave the home and doing so requires considerable and taxing effort. Other absences are for medical reasons / religious services and are infrequent or of short  duration when for other reasons). o If current dressing causes regression in wound condition, may D/C ordered dressing product/s and apply Normal Saline Moist Dressing daily until next [address] / Other MD appointment. [place] of regression in wound condition at [phone]. o Please direct any NON-WOUND related issues/requests for orders to patient's Primary Care Physician Consults o Vascular - ABI/TBI move up appointment Patient Medications Allergies: NKDA Notifications Medication Indication Start End lidocaine DOSE topical 4 % cream - cream topical JACOLBY, RISBY ([id number]) Electronic Signature(s) Signed: 04/08/2017 4:49:21 PM By: Linton Ham MD Signed: 04/13/2017 9:08:37 AM By: Gretta Cool, BSN, RN, CWS, Kim RN, BSN Entered By: Gretta Cool, BSN, RN, CWS, Kim on 04/08/2017 12:01:42 KAYTON, DUNAJ ([id number]) -------------------------------------------------------------------------------- Prescription 04/08/2017 Patient Name: [name] Provider: Ricard Dillon MD Date of Birth: Feb 11, 1940 NPI#: [id number] Sex: Male DEA#: [id number] Phone #: [phone] License #: [id number] Patient Address: [facility] [place], [place] [address] [address] [place], [place] [address] [phone] Allergies NKDA Medication Medication: Route: Strength:  Form: lidocaine 4 % topical cream topical 4% cream Class: TOPICAL LOCAL ANESTHETICS Dose: Frequency / Time: Indication: cream topical Number of Refills: Number of Units: 0 Generic Substitution: Start Date: End Date: One Time Use: Substitution Permitted No Note to Pharmacy: Signature(s): Date(s): Electronic Signature(s) Signed: 04/08/2017 4:49:21 PM By: Linton Ham MD Signed: 04/13/2017 9:08:37 AM By: Gretta Cool, BSN, RN, CWS, Kim RN, BSN Entered By: Gretta Cool, BSN, RN, CWS, Kim on 04/08/2017  12:01:43 Dexter, [name] ([id number]) --------------------------------------------------------------------------------  Problem List Details Patient Name: [name] Date of Service: 04/08/2017 9:45 AM Medical Record Number: [id number] Patient Account Number: [id number] Date of Birth/Sex: 06-29-1940 (77 y.o. 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 limited to 04/08/2017 Yes breakdown of skin L97.211 Non-pressure chronic ulcer of right calf limited to breakdown 04/08/2017 Yes of skin L97.511 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 limited to 04/08/2017 Yes breakdown of skin I25.5 Ischemic cardiomyopathy 04/08/2017 Yes Inactive Problems Resolved Problems Electronic Signature(s) Signed: 04/08/2017 4:49:21 PM By: Linton Ham MD Entered By: Linton Ham on 04/08/2017 16:23:33 Miner, [name] ([id number]) -------------------------------------------------------------------------------- Progress Note Details Patient Name: [name] Date of Service: 04/08/2017 9:45 AM Medical Record Number: [id number] Patient Account Number: [id number] Date of Birth/Sex: 1940-09-28 (77 y.o. Male) Treating RN: Cornell Barman Primary Care Provider: Cyndi Bender Other Clinician: Referring Provider: Marcelle Overlie Treating Provider/Extender: Ricard Dillon Weeks in Treatment: 0 Subjective History of Present Illness (HPI) 04/08/17; this is a complex 77 year old man referred here from [facility] and vascular. He had been referred there for bilateral lower extremity edema with ulcer formation predominantly on the right calf but also the right [name]. He had  been receiving Unna boots bilaterally. The history here is long. He is not a diabetic however ICU looking through late 2018 he was worked up for chronic headaches, elevated inflammatory markers including C-reactive protein and ESR. He went on to actually have a left temporal artery biopsy wasn't that was negative.he received about 6 weeks of high-dose prednisone 60 mg with improvement in his inflammatory markers. He was admitted to hospital in late November with ventricular tachycardia syncope. He has known ischemic cardiomyopathy. He was admitted in the hospital in mid December. Apparently this was precipitated by a syncopal spell falling out of his scooter while at [place]. There was ventricular arrhythmia. He has an implantable defibrillator and echocardiogram showed severe LV dysfunction with an EF of 20% and valvular regurgitations including mild AR, moderate MR. There was no stenosis. He ruled in for a non-ST elevation MI in the setting of V. tach.his wife states that sometime during this hospitalization she noted multiple areas of skin change on the right lower calf which became evident just after he left the hospital. He was back in hospital in February with acute renal failure hyponatremia. This responded to fluid resuscitation.. Interestingly I can't see much description of his right leg at that point in time.he was followed by Dr. Nehemiah Massed of dermatology for the necrotic wounds on his right leg. Apparently a biopsy was planned at one point but not done although in some notes that suggests it was. I cannot see these results area He was noted to have a lot of edema. Was treated with bilateral Unna boots edges really helped with the  swelling they have been using Bactroban to small open areas predominantly on the right anterior lower leg His history is complicated by the fact that he has rheumatoid arthritis followed by rheumatology. He is followed by neurology for disabling headaches. At  one point this was felt to be giant cell arteritis although a left temporal biopsy was apparently negative. He was given a prolonged course of prednisone at 60 mg which managed his sedimentation rates but apparently did not prove improve the headaches. This is been tapered to off on by rheumatology on 03/13/17 Vascular had plans to do a venous reflux workup as well as arterial studies in May. They also wanted to get him a lymphedema pump. As mentioned he's been using bacitracin under Unna boot wraps to both lower legs Wound History Patient presents with 5 open wounds that have been present for approximately Jan 15 2017. Patient has been treating wounds in the following manner: unna boot. Laboratory tests have not been performed in the last month. Patient reportedly has not tested positive for an antibiotic resistant organism. Patient reportedly has not tested positive for osteomyelitis. Patient reportedly has had testing performed to evaluate circulation in the legs. Patient experiences the following problems associated with their wounds: swelling. Patient History Information obtained from Patient. Allergies NKDA Brandt, GARL SPEIGNER ([id number]) Family History No family history of Cancer, Diabetes, Heart Disease, Hereditary Spherocytosis, Hypertension, Kidney Disease, Lung Disease, Seizures, Stroke, Thyroid Problems, Tuberculosis. Social History Never smoker, Marital Status - Married, Alcohol Use - Never, Drug Use - No History, Caffeine Use - Rarely. Medical History Cardiovascular Patient has history of Congestive Heart Failure, Coronary Artery Disease, Hypertension, Myocardial Infarction Endocrine Denies history of Type I Diabetes, Type II Diabetes Musculoskeletal Patient has history of Osteoarthritis Review of Systems (ROS) Eyes Complains or has symptoms of Glasses / Contacts. Ear/Nose/Mouth/Throat The patient has no complaints or symptoms. Hematologic/Lymphatic The patient has no  complaints or symptoms. Respiratory Complains or has symptoms of Shortness of Breath. Cardiovascular Complains or has symptoms of Chest pain, arteriosclerosis of coronary artery hyperlipidemia idiopathic ventricular tachycardia carotid stenosis Gastrointestinal Denies complaints or symptoms of Nausea. Endocrine Complains or has symptoms of Thyroid disease. Genitourinary hx kidney injury Immunological The patient has no complaints or symptoms. Integumentary (Skin) Complains or has symptoms of Wounds. Neurologic syncope Oncologic The patient has no complaints or symptoms. Psychiatric The patient has no complaints or symptoms. Objective Constitutional Patient is hypertensive.. Pulse regular and within target range for patient.[name] Respirations regular, non-labored and within target range.. Temperature is normal and within the target range for the patient.[name] appears in no distress. Vitals Time Taken: 9:59 AM, Height: 68 in, Source: Stated, Pulse: 74 bpm, Respiratory Rate: 16 breaths/min, Blood Pressure: Boney, Kyion E. ([id number]) 107/55 mmHg. General Notes: was unable to get temp orally or axillary made Dr. Dellia Nims aware of same Eyes Conjunctivae clear. No discharge. Respiratory Respiratory effort is easy and symmetric bilaterally. Rate is normal at rest and on room air.. Bilateral breath sounds are clear and equal in all lobes with no wheezes, rales or rhonchi.. Cardiovascular 3/6 holosystolic murmur at the PMI radiating to the sella compatible with mitral regurgitation. JVP was not elevated. even had trouble feeling this man's femoral pulse although he wasn't undressed. I could not feel the popliteal pulse. Pedal pulses absent bilaterally.. has mild edema in both legs. This is apparently a major improvement from December according to his wife. Gastrointestinal (GI) Abdomen is soft and non-distended without masses or tenderness. Bowel sounds active in all  quadrants.. Lymphatic none palpable in the popliteal or inguinal area bilaterally. Neurological reflexes reduced at the knee jerks and ankle jerks. Left plantar flexor right equivocal to extensor. Psychiatric No evidence of depression, anxiety, or agitation. Calm, cooperative, and communicative. Appropriate interactions and affect.. General Notes: wound exam; pupils skin areas on the right anterior right posterior calf. There is an area on the left lateral [name]. All the areas on the left posterior calf are closed. Anteriorly there are small punched out areas with thick eschar and necrotic debris under the eschar. I debrided 6 of these 2 revealed generally pretty good-looking granulation underneath. There is no evidence of surrounding infection. The cause of these wounds is really not clear. His wife states that they thought this was secondary to edema and coexistent infection I am doubtful that is true. He could have venous reflux disease and I'm sure he did have lymphedema although these do not look like the type of wounds that people traditionally get from this. I am concerned about the lack of peripheral pulses in his feet or his popliteal area vis--vis 4 layer compression Integumentary (Hair, Skin) multiple small areas on the right calf both anteriorly and posteriorly. Almost all the areas on the right posterior calf for fully closed. There are several that area of adherent black eschar and nonviable subcutaneous tissue requiring debridement anteriorly.. Wound #1 status is Open. Original cause of wound was Gradually Appeared. The wound is located on the Right,Proximal,Medial Lower Leg. The wound measures 0.6cm length x 0.5cm width x 0.1cm depth; 0.236cm^2 area and 0.024cm^3 volume. There is no tunneling or undermining noted. There is a large amount of sanguinous drainage noted. The wound margin is distinct with the outline attached to the wound base. There is medium (34-66%) red  granulation within the wound bed. There is a medium (34-66%) amount of necrotic tissue within the wound bed including Eschar and Adherent Slough. Periwound temperature was noted as No Abnormality. The periwound has tenderness on palpation. Wound #2 status is Open. Original cause of wound was Gradually Appeared. The wound is located on the Right,Distal,Medial Lower Leg. The wound measures 0.6cm length x 0.4cm width x 0.2cm depth; 0.188cm^2 area and 0.038cm^3 volume. There is no tunneling or undermining noted. There is a large amount of sanguinous drainage noted. The wound margin is flat and intact. There is medium (34-66%) red granulation within the wound bed. There is a medium (34-66%) amount of necrotic tissue within the wound bed. Periwound temperature was noted as No Abnormality. The periwound has tenderness on palpation. Wound #3 status is Open. Original cause of wound was Gradually Appeared. The wound is located on the Right,Anterior Lower Leg. The wound measures 0.9cm length x 0.7cm width x 0.3cm depth; 0.495cm^2 area and 0.148cm^3 volume. There is Fat Layer (Subcutaneous Tissue) Exposed exposed. There is no tunneling or undermining noted. There is a large amount of sanguinous drainage noted. The wound margin is flat and intact. There is medium (34-66%) red granulation within the Hinshaw, Trino E. ([id number]) wound bed. There is a medium (34-66%) amount of necrotic tissue within the wound bed including Eschar and Adherent Slough. The periwound skin appearance did not exhibit: Callus, Crepitus, Excoriation, Induration, Rash, Scarring, Dry/Scaly, Maceration, Atrophie Blanche, Cyanosis, Ecchymosis, Hemosiderin Staining, Mottled, Pallor, Rubor, Erythema. Periwound temperature was noted as No Abnormality. Wound #4 status is Open. Original cause of wound was Gradually Appeared. The wound is located on the Right,Proximal,Lateral Lower Leg. The wound measures 0.5cm length x 0.5cm width x 0.3cm  depth; 0.196cm^2 area and 0.059cm^3 volume. There is no tunneling or undermining noted. There is a large amount of sanguinous drainage noted. The wound margin is flat and intact. There is large (67-100%) red granulation within the wound bed. There is no necrotic tissue within the wound bed. The periwound skin appearance exhibited: Erythema. The surrounding wound skin color is noted with erythema which is circumferential. Periwound temperature was noted as No Abnormality. The periwound has tenderness on palpation. Wound #5 status is Open. Original cause of wound was Gradually Appeared. The wound is located on the Right,Distal,Lateral Lower Leg. The wound measures 0.5cm length x 0.6cm width x 0.2cm depth; 0.236cm^2 area and 0.047cm^3 volume. There is no tunneling or undermining noted. There is a none present amount of drainage noted. The wound margin is flat and intact. There is medium (34-66%) red granulation within the wound bed. There is a medium (34-66%) amount of necrotic tissue within the wound bed including Eschar and Adherent Slough. The periwound skin appearance exhibited: Erythema. The surrounding wound skin color is noted with erythema which is circumferential. Periwound temperature was noted as No Abnormality. The periwound has tenderness on palpation. Wound #6 status is Open. Original cause of wound was Gradually Appeared. The wound is located on the Right,Lateral Malleolus. The wound measures 0.2cm length x 0.2cm width x 0.1cm depth; 0.031cm^2 area and 0.003cm^3 volume. There is Fat Layer (Subcutaneous Tissue) Exposed exposed. There is no tunneling or undermining noted. There is a medium amount of sanguinous drainage noted. The wound margin is flat and intact. There is no granulation within the wound bed. There is a large (67-100%) amount of necrotic tissue within the wound bed including Adherent Slough. 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 There was a Selective/Open Wound Non-Viable Tissue Debridement with a total area of 0.3 sq cm performed by Ricard Dillon, MD. With the following instrument(s): Curette. to remove Viable and Non-Viable tissue/material Material removed includes [place] and after achieving pain control using Other (lidocaine 4%). No specimens were taken. A time out was conducted at 11:38, prior to the start of the procedure. A Moderate amount of bleeding was controlled with Pressure. The procedure was tolerated well with a pain level of 0 throughout and a pain level of 0 following the procedure. Post Debridement Measurements: 0.6cm length x 0.5cm width x 0.3cm depth; 0.071cm^3 volume. Character of Wound/Ulcer Post Debridement is stable. Severity of Tissue Post Debridement is: Fat layer exposed. Post procedure Diagnosis Wound #1: Same as Pre-Procedure Audia, Dominque E. ([id number]) Wound #2 Pre-procedure diagnosis of Wound #2 is an Arterial Insufficiency Ulcer located on the Right,Distal,Medial Lower Leg .Severity of Tissue Pre Debridement is: Fat layer exposed. There was a Selective/Open Wound Non-Viable Tissue Debridement with a total area of 0.24 sq cm performed by Ricard Dillon, MD. With the following instrument(s): Curette. to remove Viable and Non-Viable tissue/material Material removed includes [place] and after achieving pain control using Other (lidocaine 4%). No specimens were taken. A time out was conducted at 11:38, prior to the start  of  the procedure. A Moderate amount of bleeding was controlled with Pressure. The procedure was tolerated well with a pain level of 0 throughout and a pain level of 0 following the procedure. Post Debridement Measurements: 0.6cm length x 0.4cm width x 0.2cm depth; 0.038cm^3 volume. Character of Wound/Ulcer Post Debridement is stable. Severity of Tissue Post Debridement is: Fat layer exposed. Post procedure Diagnosis Wound #2: Same as Pre-Procedure Wound #4 Pre-procedure diagnosis of Wound #4 is an Arterial Insufficiency Ulcer located on the Right,Proximal,Lateral [name] .Severity of Tissue Pre Debridement is: Fat layer exposed. There was a Selective/Open Wound Non-Viable Tissue Debridement with a total area of 0.25 sq cm performed by Ricard Dillon, MD. With the following instrument(s): Curette. to remove Viable and Non-Viable tissue/material Material removed includes [place] and after achieving pain control using Other (lidocaine 4%). No specimens were taken. A time out was conducted at 11:38, prior to the start of the procedure. A Moderate amount of bleeding was controlled with Pressure. The procedure was tolerated well with a pain level of 0 throughout and a pain level of 0 following the procedure. Post Debridement Measurements: 0.5cm length x 0.5cm width x 0.3cm depth; 0.059cm^3 volume. Character of Wound/Ulcer Post Debridement is stable. Severity of Tissue Post Debridement is: Fat layer exposed. Post procedure Diagnosis Wound #4: Same as Pre-Procedure Wound #6 Pre-procedure diagnosis of Wound #6 is an Arterial Insufficiency Ulcer located on the Right,Lateral Malleolus .Severity of Tissue Pre Debridement is: Fat layer exposed. There was a Excisional Skin/Subcutaneous Tissue Debridement with a total area of 0.04 sq cm performed by Ricard Dillon, MD. With the following instrument(s): Curette. to remove Viable and Non-Viable tissue/material Material removed includes Subcutaneous  Tissue, and [place] after achieving pain control using Other (lidocaine 4%). No specimens were taken. A time out was conducted at 11:38, prior to the start of the procedure. A Moderate amount of bleeding was controlled with Pressure. The procedure was tolerated well with a pain level of 0 throughout and a pain level of 0 following the procedure. Post Debridement Measurements: 0.2cm length x 0.2cm width x 0.1cm depth; 0.003cm^3 volume. Character of Wound/Ulcer Post Debridement is stable. Severity of Tissue Post Debridement is: Fat layer exposed. Post procedure Diagnosis Wound #6: Same as Pre-Procedure Plan Wound Cleansing: Clean wound with Normal Saline. Anesthetic (add to Medication List): Topical Lidocaine 4% cream applied to wound bed prior to debridement (In Clinic Only). Primary Wound Dressing: Silver Collagen - all wounds Secondary Dressing: ABD pad - over collegen, Unna paste to [place] at top Dressing Change Frequency: Change Dressing Monday, Wednesday, Friday Follow-up Appointments: Return Appointment in 1 week. NISSIM, FLEISCHER ([id number]) Edema Control: 3 Layer Compression System - Right Lower Extremity Home Health: [facility] Nurse may visit [facility] to address patient s wound care needs. FACE TO FACE ENCOUNTER: MEDICARE and MEDICAID PATIENTS: I certify that this patient is under my care and that I had a face-to-face encounter that meets the physician face-to-face encounter requirements with this patient on this date. The encounter with the patient was in whole or in part for the following MEDICAL CONDITION: (primary reason for [place]) MEDICAL NECESSITY: I certify, that based on my findings, NURSING services are a medically necessary home health service. HOME BOUND STATUS: I certify that my clinical findings support that this patient is homebound (i.e., Due to illness or injury, pt requires aid of supportive devices such as  crutches, cane, wheelchairs, walkers, the use of special transportation  or the assistance of another person to leave their place of residence. There is a normal inability to leave the home and doing so requires considerable and taxing effort. Other absences are for medical reasons / religious services and are infrequent or of short duration when for other reasons). If current dressing causes regression in wound condition, may D/C ordered dressing product/s and apply Normal Saline Moist Dressing daily until next [place] / Other MD appointment. [place] of regression in wound condition at [phone]. Please direct any NON-WOUND related issues/requests for orders to patient's Primary Care Physician Consults ordered were: Vascular - ABI/TBI move up appointment The following medication(s) was prescribed: lidocaine topical 4 % cream cream topical was prescribed at facility #1 atypical ulcers on the right leg. Many of these of healed especially posteriorly on the calf. I did for 5 small debridements using a #3 curet. The base of these looks generally healthy. I am puzzled about the exact cause of this.looking at this one would wonder whether these were vasculitic ulcers or vasculopathy induced ulcers of uncertain cause.. #2 with regards to his macrovascular assessment. His ABIs in the right leg were noncompressible although I could not feel any pulses in his feet nor is popliteal pulses. I could not feel the femoral pulse on the right but he wasn't completely undressed. I think this needs to be looked at with some urgency to make sure that we can put for later compression on this man safely. In view of this I reduced him to 3 layer compression from the Unna boots #3 there was some suggestion that this patient be referred to [place] apparently his dermatologist in [place] his rheumatologist. I'm not sure where we are with this #4 was no evidence of cardiac issues  at the bedside. He has mitral regurgitation although reviewing his echocardiogram shows this to be unknown phenomenon. Recent echocardiogram dated not make reference to a source of peripheral emboli. I couldn't convince myself about any element of heart failure at the bedside although the patient did mention orthopnea #5 we applied silver collagen to the open areas on the right leg that were debrided/ABDs/3 layer compression #6 we will see if we can get the arterial arterial studies including arterial Dopplers ABIs and TBIs at vein and vascular Electronic Signature(s) Signed: 04/08/2017 4:49:21 PM By: Linton Ham MD Entered By: Linton Ham on 04/08/2017 16:44:21 Hoopingarner, [name] ([id number]) -------------------------------------------------------------------------------- ROS/PFSH Details Patient Name: [name] Date of Service: 04/08/2017 9:45 AM Medical Record Number: [id number] Patient Account Number: [id number] Date of Birth/Sex: [birth year] (77 y.o. Male) Treating RN: Ahmed Prima Primary Care Provider: Cyndi Bender Other Clinician: Referring Provider: Marcelle Overlie Treating Provider/Extender: Ricard Dillon Weeks in Treatment: 0 Information Obtained From Patient Wound History Do you currently have one or more open woundso Yes How many open wounds do you currently haveo 5 Approximately how long have you had your woundso Jan 15 2017 How have you been treating your wound(s) until nowo unna boot Has your wound(s) ever healed and then re-openedo No Have you had any lab work done in the past montho No Have you tested positive for an antibiotic resistant organism (MRSA, VRE)o No Have you tested positive for osteomyelitis (bone infection)o No Have you had any tests for circulation on your legso Yes Who ordered the testo [place], [place] Where was the test doneo [facility] Have you had other problems associated with your woundso Swelling Eyes Complaints and  Symptoms: Positive for: Glasses / Contacts  Respiratory Complaints and Symptoms: Positive for: Shortness of Breath Cardiovascular Complaints and Symptoms: Positive for: Chest pain Review of System Notes: arteriosclerosis of coronary artery hyperlipidemia idiopathic ventricular tachycardia carotid stenosis Medical History: Positive for: Congestive Heart Failure; Coronary Artery Disease; Hypertension; Myocardial Infarction Gastrointestinal Complaints and Symptoms: Negative for: Nausea Endocrine Complaints and Symptoms: Positive for: Thyroid disease Losito, ELSON ULBRICH ([id number]) Medical History: Negative for: Type I Diabetes; Type II Diabetes Integumentary (Skin) Complaints and Symptoms: Positive for: Wounds Ear/Nose/Mouth/Throat Complaints and Symptoms: No Complaints or Symptoms Hematologic/Lymphatic Complaints and Symptoms: No Complaints or Symptoms Genitourinary Complaints and Symptoms: Review of System Notes: hx kidney injury Immunological Complaints and Symptoms: No Complaints or Symptoms Musculoskeletal Medical History: Positive for: Osteoarthritis Neurologic Complaints and Symptoms: Review of System Notes: syncope Oncologic Complaints and Symptoms: No Complaints or Symptoms Psychiatric Complaints and Symptoms: No Complaints or Symptoms Immunizations Pneumococcal Vaccine: Received Pneumococcal Vaccination: No Implantable Devices Family and Social History Cancer: No; Diabetes: No; Heart Disease: No; Hereditary Spherocytosis: No; Hypertension: No; Kidney Disease: No; Lung Disease: No; Seizures: No; Stroke: No; Thyroid Problems: No; Tuberculosis: No; Never smoker; Marital Status - Married; KARSYN, ROCHIN. ([id number]) Alcohol Use: Never; Drug Use: No History; Caffeine Use: Rarely; Financial Concerns: No; Food, Clothing or Shelter Needs: No; Support System Lacking: No; Transportation Concerns: No; Advanced Directives: No; Patient does not want  information on Advanced Directives; Do not resuscitate: No; Living Will: Yes (Not Provided); Medical Power of Attorney: Yes - Marsa Aris (Not Provided) Electronic Signature(s) Signed: 04/08/2017 4:49:21 PM By: Linton Ham MD Signed: 04/09/2017 4:32:30 PM By: Alric Quan Entered By: Alric Quan on 04/08/2017 10:20:39 Towner, [name] ([id number]) -------------------------------------------------------------------------------- Prospect Details Patient Name: [name] Date of Service: 04/08/2017 Medical Record Number: [id number] Patient Account Number: [id number] Date of Birth/Sex: June 21, 1940 (77 y.o. 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 limited to breakdown of skin I25.5 Ischemic cardiomyopathy Facility Procedures CPT4 Code: 41324401 Description: 99213 - WOUND CARE VISIT-LEV 3 EST PT Modifier: Quantity: 1 CPT4 Code: [id number] Description: 11042 - DEB SUBQ TISSUE 20 SQ CM/< ICD-10 Diagnosis Description L97.211 Non-pressure chronic ulcer of right calf limited to breakdown o Modifier: 25 f skin Quantity: 1 CPT4 Code: [id number] Description: 97597 - DEBRIDE WOUND 1ST 20 SQ CM OR < ICD-10 Diagnosis Description L97.211 Non-pressure chronic ulcer of right calf limited to breakdown o Modifier: f skin Quantity: 1 Physician Procedures CPT4 Code Description: [id number] 56433 - WC PHYS LEVEL 4 - NEW PT ICD-10 Diagnosis Description L97.211 Non-pressure chronic ulcer of right calf limited to breakdown of L97.521 Non-pressure chronic ulcer of other  part of left [name] limited to L97.511  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 limited to I25.5 Ischemic cardiomyopathy Modifier: 25 skin breakdown of s breakdown of Quantity: 1 kin skin CPT4 Code Description: [id number] 11042 - WC PHYS SUBQ TISS 20 SQ CM ICD-10 Diagnosis Description L97.211 Non-pressure chronic ulcer of right calf limited to breakdown of Modifier: 25 skin Quantity: 1 CPT4 Code Description: 1660630 97597 - WC PHYS DEBR WO ANESTH 20 SQ CM ICD-10 Diagnosis Description L97.211 Non-pressure chronic ulcer of right calf limited to breakdown of Modifier: skin Quantity: 1  Electronic Signature(s) CAIDENCE, HIGASHI ([id number]) Signed: 04/08/2017 4:45:57 PM By: Linton Ham MD Entered By: Linton Ham on 04/08/2017 16:45:57

## 2017-04-15 ENCOUNTER — Encounter: Payer: Medicare Other | Admitting: Internal Medicine

## 2017-04-15 DIAGNOSIS — L97519 Non-pressure chronic ulcer of other part of right foot with unspecified severity: Secondary | ICD-10-CM | POA: Diagnosis not present

## 2017-04-15 DIAGNOSIS — I255 Ischemic cardiomyopathy: Secondary | ICD-10-CM | POA: Diagnosis not present

## 2017-04-15 DIAGNOSIS — I70233 Atherosclerosis of native arteries of right leg with ulceration of ankle: Secondary | ICD-10-CM | POA: Diagnosis not present

## 2017-04-15 DIAGNOSIS — L97521 Non-pressure chronic ulcer of other part of left foot limited to breakdown of skin: Secondary | ICD-10-CM | POA: Diagnosis not present

## 2017-04-15 DIAGNOSIS — L97212 Non-pressure chronic ulcer of right calf with fat layer exposed: Secondary | ICD-10-CM | POA: Diagnosis not present

## 2017-04-15 DIAGNOSIS — L97211 Non-pressure chronic ulcer of right calf limited to breakdown of skin: Secondary | ICD-10-CM | POA: Diagnosis not present

## 2017-04-15 DIAGNOSIS — L97812 Non-pressure chronic ulcer of other part of right lower leg with fat layer exposed: Secondary | ICD-10-CM | POA: Diagnosis not present

## 2017-04-15 DIAGNOSIS — I70238 Atherosclerosis of native arteries of right leg with ulceration of other part of lower right leg: Secondary | ICD-10-CM | POA: Diagnosis not present

## 2017-04-15 DIAGNOSIS — L97219 Non-pressure chronic ulcer of right calf with unspecified severity: Secondary | ICD-10-CM | POA: Diagnosis not present

## 2017-04-15 DIAGNOSIS — L97511 Non-pressure chronic ulcer of other part of right foot limited to breakdown of skin: Secondary | ICD-10-CM | POA: Diagnosis not present

## 2017-04-15 DIAGNOSIS — I70232 Atherosclerosis of native arteries of right leg with ulceration of calf: Secondary | ICD-10-CM | POA: Diagnosis not present

## 2017-04-15 DIAGNOSIS — L97312 Non-pressure chronic ulcer of right ankle with fat layer exposed: Secondary | ICD-10-CM | POA: Diagnosis not present

## 2017-04-16 DIAGNOSIS — I255 Ischemic cardiomyopathy: Secondary | ICD-10-CM | POA: Diagnosis not present

## 2017-04-16 DIAGNOSIS — M069 Rheumatoid arthritis, unspecified: Secondary | ICD-10-CM | POA: Diagnosis not present

## 2017-04-16 DIAGNOSIS — I110 Hypertensive heart disease with heart failure: Secondary | ICD-10-CM | POA: Diagnosis not present

## 2017-04-16 DIAGNOSIS — I2510 Atherosclerotic heart disease of native coronary artery without angina pectoris: Secondary | ICD-10-CM | POA: Diagnosis not present

## 2017-04-16 DIAGNOSIS — M6281 Muscle weakness (generalized): Secondary | ICD-10-CM | POA: Diagnosis not present

## 2017-04-16 DIAGNOSIS — I5023 Acute on chronic systolic (congestive) heart failure: Secondary | ICD-10-CM | POA: Diagnosis not present

## 2017-04-17 DIAGNOSIS — I2510 Atherosclerotic heart disease of native coronary artery without angina pectoris: Secondary | ICD-10-CM | POA: Diagnosis not present

## 2017-04-17 DIAGNOSIS — M069 Rheumatoid arthritis, unspecified: Secondary | ICD-10-CM | POA: Diagnosis not present

## 2017-04-17 DIAGNOSIS — I255 Ischemic cardiomyopathy: Secondary | ICD-10-CM | POA: Diagnosis not present

## 2017-04-17 DIAGNOSIS — I5023 Acute on chronic systolic (congestive) heart failure: Secondary | ICD-10-CM | POA: Diagnosis not present

## 2017-04-17 DIAGNOSIS — I110 Hypertensive heart disease with heart failure: Secondary | ICD-10-CM | POA: Diagnosis not present

## 2017-04-17 DIAGNOSIS — M6281 Muscle weakness (generalized): Secondary | ICD-10-CM | POA: Diagnosis not present

## 2017-04-17 NOTE — Progress Notes (Signed)
RAWLEIGH, RODE ([id number]) Visit Report for 04/15/2017 Debridement Details Patient Name: [name], [name] Date of Service: 04/15/2017 1:30 PM Medical Record Number: [id number] Patient Account Number: [id number] Date of Birth/Sex: Sep 24, 1940 (76 y.o. M) Treating RN: Cornell Barman Primary Care Provider: Cyndi Bender Other Clinician: Referring Provider: Cyndi Bender Treating Provider/Extender: Tito Dine in Treatment: 1 Debridement Performed for Wound #3 Right,Anterior Lower Leg Assessment: Performed By: Physician Ricard Dillon, MD Debridement Type: Debridement Severity of Tissue Pre Fat layer exposed Debridement: Pre-procedure Verification/Time Yes - 14:15 Out Taken: Start Time: 14:15 Pain Control: Other : lidocaine 4% Total Area Debrided (L x W): 0.5 (cm) x 0.7 (cm) = 0.35 (cm) Tissue and other material Viable, Non-Viable, Slough, Subcutaneous, Slough debrided: Level: Skin/Subcutaneous Tissue Debridement Description: Excisional Instrument: Curette Bleeding: Minimum Hemostasis Achieved: Pressure End Time: 14:15 Procedural Pain: 0 Post Procedural Pain: 0 Response to Treatment: Procedure was tolerated well Post Debridement Measurements of Total Wound Length: (cm) 0.5 Width: (cm) 0.7 Depth: (cm) 0.3 Volume: (cm) 0.082 Character of Wound/Ulcer Post Debridement: Stable Severity of Tissue Post Debridement: Fat layer exposed Post Procedure Diagnosis Same as Pre-procedure Electronic Signature(s) Signed: 04/15/2017 2:55:02 PM By: Gretta Cool, BSN, RN, CWS, Kim RN, BSN Signed: 04/15/2017 5:02:04 PM By: Linton Ham MD Entered By: Linton Ham on 04/15/2017 14:22:30 Ausburn, [name] ([id number]) -------------------------------------------------------------------------------- Debridement Details Patient Name: [name] Date of Service: 04/15/2017 1:30 PM Medical Record Number: [id number] Patient Account Number: [id number] Date of Birth/Sex:  September 20, 1940 (76 y.o. M) Treating RN: Cornell Barman Primary Care Provider: Cyndi Bender Other Clinician: Referring Provider: Cyndi Bender Treating Provider/Extender: Tito Dine in Treatment: 1 Debridement Performed for Wound #5 Right,Distal,Lateral Lower Leg Assessment: Performed By: Physician Ricard Dillon, MD Debridement Type: Debridement Severity of Tissue Pre Fat layer exposed Debridement: Pre-procedure Verification/Time Yes - 14:15 Out Taken: Start Time: 14:15 Pain Control: Other : lidocaine 4% Total Area Debrided (L x W): 0.4 (cm) x 0.5 (cm) = 0.2 (cm) Tissue and other material Viable, Non-Viable, Slough, Subcutaneous, Slough debrided: Level: Skin/Subcutaneous Tissue Debridement Description: Excisional Instrument: Curette Bleeding: Minimum Hemostasis Achieved: Pressure End Time: 14:15 Procedural Pain: 0 Post Procedural Pain: 0 Response to Treatment: Procedure was tolerated well Post Debridement Measurements of Total Wound Length: (cm) 0.4 Width: (cm) 0.5 Depth: (cm) 0.2 Volume: (cm) 0.031 Character of Wound/Ulcer Post Debridement: Stable Severity of Tissue Post Debridement: Fat layer exposed Post Procedure Diagnosis Same as Pre-procedure Electronic Signature(s) Signed: 04/15/2017 2:55:02 PM By: Gretta Cool, BSN, RN, CWS, Kim RN, BSN Signed: 04/15/2017 5:02:04 PM By: Linton Ham MD Entered By: Linton Ham on 04/15/2017 14:22:45 Manrique, [name] ([id number]) -------------------------------------------------------------------------------- Debridement Details Patient Name: [name] Date of Service: 04/15/2017 1:30 PM Medical Record Number: [id number] Patient Account Number: [id number] Date of Birth/Sex: Nov 14, 1940 (76 y.o. M) Treating RN: Cornell Barman Primary Care Provider: Cyndi Bender Other Clinician: Referring Provider: Cyndi Bender Treating Provider/Extender: Tito Dine in Treatment: 1 Debridement Performed  for Wound #6 Right,Lateral Malleolus Assessment: Performed By: Physician Ricard Dillon, MD Debridement Type: Debridement Severity of Tissue Pre Fat layer exposed Debridement: Pre-procedure Verification/Time Yes - 14:15 Out Taken: Start Time: 14:15 Pain Control: Other : lidocaine 4% Total Area Debrided (L x W): 0.4 (cm) x 0.4 (cm) = 0.16 (cm) Tissue and other material Viable, Non-Viable, Slough, Subcutaneous, Slough debrided: Level: Skin/Subcutaneous Tissue Debridement Description: Excisional Instrument: Curette Bleeding: Minimum Hemostasis Achieved: Pressure End Time: 14:15 Procedural Pain: 0 Post Procedural Pain: 0 Response to Treatment: Procedure was tolerated well  Post Debridement Measurements of Total Wound Length: (cm) 0.4 Width: (cm) 0.4 Depth: (cm) 0.2 Volume: (cm) 0.025 Character of Wound/Ulcer Post Debridement: Stable Severity of Tissue Post Debridement: Fat layer exposed Post Procedure Diagnosis Same as Pre-procedure Electronic Signature(s) Signed: 04/15/2017 2:55:02 PM By: Gretta Cool, BSN, RN, CWS, Kim RN, BSN Signed: 04/15/2017 5:02:04 PM By: Linton Ham MD Entered By: Linton Ham on 04/15/2017 14:22:57 [place], [name] ([id number]) -------------------------------------------------------------------------------- HPI Details Patient Name: [name] Date of Service: 04/15/2017 1:30 PM Medical Record Number: [id number] Patient Account Number: [id number] Date of Birth/Sex: [birth year] (76 y.o. M) Treating RN: Cornell Barman Primary Care Provider: Cyndi Bender Other Clinician: Referring Provider: Cyndi Bender Treating Provider/Extender: Tito Dine in Treatment: 1 History of Present Illness HPI Description: 04/08/17; this is a complex 77 year old man referred here from [facility]. He had been referred there for bilateral lower extremity edema with ulcer formation predominantly on the right calf but also the right  [name]. He had been receiving Unna boots bilaterally. The history here is long. He is not a diabetic however ICU looking through late 2018 he was worked up for chronic headaches, elevated inflammatory markers including C-reactive protein and ESR. He went on to actually have a left temporal artery biopsy wasn't that was negative.he received about 6 weeks of high-dose prednisone 60 mg with improvement in his inflammatory markers. He was admitted to hospital in late November with ventricular tachycardia syncope. He has known ischemic cardiomyopathy. He was admitted in the hospital in mid December. Apparently this was precipitated by a syncopal spell falling out of his scooter while at [place]. There was ventricular arrhythmia. He has an implantable defibrillator and echocardiogram showed severe LV dysfunction with an EF of 20% and valvular regurgitations including mild AR, moderate MR. There was no stenosis. He ruled in for a non-ST elevation MI in the setting of V. tach.his wife states that sometime during this hospitalization she noted multiple areas of skin change on the right lower calf which became evident just after he left the hospital. He was back in hospital in February with acute renal failure hyponatremia. This responded to fluid resuscitation.. Interestingly I can't see much description of his right leg at that point in time.he was followed by Dr. Nehemiah Massed of dermatology for the necrotic wounds on his right leg. Apparently a biopsy was planned at one point but not done although in some notes that suggests it was. I cannot see these results area He was noted to have a lot of edema. Was treated with bilateral Unna boots edges really helped with the swelling they have been using Bactroban to small open areas predominantly on the right anterior lower leg His history is complicated by the fact that he has rheumatoid arthritis followed by rheumatology. He is followed by neurology for disabling  headaches. At one point this was felt to be giant cell arteritis although a left temporal biopsy was apparently negative. He was given a prolonged course of prednisone at 60 mg which managed his sedimentation rates but apparently did not prove improve the headaches. This is been tapered to off on by rheumatology on 03/13/17 Vascular had plans to do a venous reflux workup as well as arterial studies in May. They also wanted to get him a lymphedema pump. As mentioned he's been using bacitracin under Unna boot wraps to both lower legs 04/15/17; the patient arrives with most of his wounds improved. These are small punched out wounds. Most of them remaining ones are  on the right anterior calf with the most problematic over the right lateral malleolus. There are no new areas. The symptom complex or potential symptom complex we are dealing with his chronic disabling headaches with inflammatory markers not responsive to prednisone and with a negative temporal biopsy, lower extremity weakness, skin ulcerations just on the right leg. We have managed to get his arterial studies moved up to April 30. He has a rheumatology consult at [facility] in June. He has seen dermatology locally, rheumatology locally, neurology locally. Electronic Signature(s) Signed: 04/15/2017 5:02:04 PM By: Linton Ham MD Entered By: Linton Ham on 04/15/2017 14:34:47 Schembri, [name] ([id number]) -------------------------------------------------------------------------------- Physical Exam Details Patient Name: [name] Date of Service: 04/15/2017 1:30 PM Medical Record Number: [id number] Patient Account Number: [id number] Date of Birth/Sex: Oct 24, 1940 (76 y.o. M) Treating RN: Cornell Barman Primary Care Provider: Cyndi Bender Other Clinician: Referring Provider: Cyndi Bender Treating Provider/Extender: Tito Dine in Treatment: 1 Constitutional Sitting or standing Blood Pressure is within target range for  patient.. Pulse regular and within target range for patient.[name] Respirations regular, non-labored and within target range.. Temperature is normal and within the target range for the patient.[name] appears in no distress. Notes wound exam; wounds are on the anterior right tibial area that is still open. More problematic lady on the right lateral malleolus. The area on the right lateral [name] appears to have closed. Most of the areas on the right posterior calf were already closed when he came here. Several of these areas including the right lateral malleolus and 4 on the right anterior calf have adherent surface debris. Using a #3 curet I removed the subcutaneous tissue. Hemostasis with direct pressure most of these that are left with a healthy-looking base. The area over the right lateral malleolus I think will still be problematic. According to patient he does not have any other wound areas in any other location Electronic Signature(s) Signed: 04/15/2017 5:02:04 PM By: Linton Ham MD Entered By: Linton Ham on 04/15/2017 14:38:02 Ginyard, [name] ([id number]) -------------------------------------------------------------------------------- Physician Orders Details Patient Name: [name] Date of Service: 04/15/2017 1:30 PM Medical Record Number: [id number] Patient Account Number: [id number] Date of Birth/Sex: 27-Apr-1940 (76 y.o. M) Treating RN: Cornell Barman Primary Care Provider: Cyndi Bender Other Clinician: Referring Provider: Cyndi Bender Treating Provider/Extender: Tito Dine in Treatment: 1 Verbal / Phone Orders: No Diagnosis Coding Wound Cleansing Wound #1 Right,Proximal,Medial Lower Leg o Clean wound with Normal Saline. Wound #2 Right,Distal,Medial Lower Leg o Clean wound with Normal Saline. Wound #3 Right,Anterior Lower Leg o Clean wound with Normal Saline. Wound #4 Right,Proximal,Lateral Lower Leg o Clean wound with Normal Saline. Wound #5  Right,Distal,Lateral Lower Leg o Clean wound with Normal Saline. Wound #6 Right,Lateral Malleolus o Clean wound with Normal Saline. Anesthetic (add to Medication List) Wound #1 Right,Proximal,Medial Lower Leg o Topical Lidocaine 4% cream applied to wound bed prior to debridement (In Clinic Only). Wound #2 Right,Distal,Medial Lower Leg o Topical Lidocaine 4% cream applied to wound bed prior to debridement (In Clinic Only). Wound #3 Right,Anterior Lower Leg o Topical Lidocaine 4% cream applied to wound bed prior to debridement (In Clinic Only). Wound #4 Right,Proximal,Lateral Lower Leg o Topical Lidocaine 4% cream applied to wound bed prior to debridement (In Clinic Only). Wound #5 Right,Distal,Lateral Lower Leg o Topical Lidocaine 4% cream applied to wound bed prior to debridement (In Clinic Only). Wound #6 Right,Lateral Malleolus o Topical Lidocaine 4% cream applied to wound bed prior to debridement (In Clinic  Only). Primary Wound Dressing Wound #1 Right,Proximal,Medial Lower Leg o Silver Collagen - all wounds Wound #2 Right,Distal,Medial Lower Leg o Silver Collagen - all wounds Dohse, LUCIA MCCREADIE ([id number]) Wound #3 Right,Anterior Lower Leg o Silver Collagen - all wounds Wound #4 Right,Proximal,Lateral Lower Leg o Silver Collagen - all wounds Wound #5 Right,Distal,Lateral Lower Leg o Silver Collagen - all wounds Wound #6 Right,Lateral Malleolus o Silver Collagen - all wounds Secondary Dressing Wound #1 Right,Proximal,Medial Lower Leg o ABD pad - over collegen, Unna paste to Anchor at top Wound #2 Right,Distal,Medial Lower Leg o ABD pad - over collegen, Unna paste to Anchor at top Wound #3 Right,Anterior Lower Leg o ABD pad - over collegen, Unna paste to Anchor at top Wound #4 Right,Proximal,Lateral Lower Leg o ABD pad - over collegen, Unna paste to Anchor at top Wound #5 Right,Distal,Lateral Lower Leg o ABD pad - over collegen, Unna  paste to Anchor at top Wound #6 Right,Lateral Malleolus o ABD pad - over collegen, Unna paste to Anchor at top Dressing Change Frequency Wound #1 Right,Proximal,Medial Lower Leg o Change Dressing Monday, Wednesday, Friday Wound #2 Right,Distal,Medial Lower Leg o Change Dressing Monday, Wednesday, Friday Wound #3 Right,Anterior Lower Leg o Change Dressing Monday, Wednesday, Friday Wound #4 Right,Proximal,Lateral Lower Leg o Change Dressing Monday, Wednesday, Friday Wound #5 Right,Distal,Lateral Lower Leg o Change Dressing Monday, Wednesday, Friday Wound #6 Right,Lateral Malleolus o Change Dressing Monday, Wednesday, Friday Follow-up Appointments Wound #1 Right,Proximal,Medial Lower Leg o Return Appointment in 1 week. Wound #2 Right,Distal,Medial Lower Leg Kratky, HA PLACERES. ([id number]) o Return Appointment in 1 week. Wound #3 Right,Anterior Lower Leg o Return Appointment in 1 week. Wound #4 Right,Proximal,Lateral Lower Leg o Return Appointment in 1 week. Wound #5 Right,Distal,Lateral Lower Leg o Return Appointment in 1 week. Wound #6 Right,Lateral Malleolus o Return Appointment in 1 week. Edema Control Wound #1 Right,Proximal,Medial Lower Leg o 3 Layer Compression System - Right Lower Extremity Wound #2 Right,Distal,Medial Lower Leg o 3 Layer Compression System - Right Lower Extremity Wound #3 Right,Anterior Lower Leg o 3 Layer Compression System - Right Lower Extremity Wound #4 Right,Proximal,Lateral Lower Leg o 3 Layer Compression System - Right Lower Extremity Wound #5 Right,Distal,Lateral Lower Leg o 3 Layer Compression System - Right Lower Extremity Wound #6 Right,Lateral Malleolus o 3 Layer Compression System - Right Lower Extremity Home Health Wound #1 Right,Proximal,Medial Lower Leg o [place] Visits o Home Health Nurse may visit PRN to address patientos wound care needs. o FACE TO FACE ENCOUNTER: MEDICARE  and MEDICAID PATIENTS: I certify that this patient is under my care and that I had a face-to-face encounter that meets the physician face-to-face encounter requirements with this patient on this date. The encounter with the patient was in whole or in part for the following MEDICAL CONDITION: (primary reason for [place]) MEDICAL NECESSITY: I certify, that based on my findings, NURSING services are a medically necessary home health service. HOME BOUND STATUS: I certify that my clinical findings support that this patient is homebound (i.e., Due to illness or injury, pt requires aid of supportive devices such as crutches, cane, wheelchairs, walkers, the use of special transportation or the assistance of another person to leave their place of residence. There is a normal inability to leave the home and doing so requires considerable and taxing effort. Other absences are for medical reasons / religious services and are infrequent or of short duration when for other reasons). o If current dressing causes regression in wound condition,  may D/C ordered dressing product/s and apply Normal Saline Moist Dressing daily until next [place] / Other MD appointment. [place] of regression in wound condition at [phone]. o Please direct any NON-WOUND related issues/requests for orders to patient's Primary Care Physician Wound #2 Right,Distal,Medial Lower Leg o [place] Nurse may visit PRN to address patientos wound care needs. o FACE TO FACE ENCOUNTER: MEDICARE and MEDICAID PATIENTS: I certify that this patient is under my care and that I had a face-to-face encounter that meets the physician face-to-face encounter requirements with this REID, REGAS. ([id number]) patient on this date. The encounter with the patient was in whole or in part for the following MEDICAL CONDITION: (primary reason for [place]) MEDICAL  NECESSITY: I certify, that based on my findings, NURSING services are a medically necessary home health service. HOME BOUND STATUS: I certify that my clinical findings support that this patient is homebound (i.e., Due to illness or injury, pt requires aid of supportive devices such as crutches, cane, wheelchairs, walkers, the use of special transportation or the assistance of another person to leave their place of residence. There is a normal inability to leave the home and doing so requires considerable and taxing effort. Other absences are for medical reasons / religious services and are infrequent or of short duration when for other reasons). o If current dressing causes regression in wound condition, may D/C ordered dressing product/s and apply Normal Saline Moist Dressing daily until next [place] / Other MD appointment. [place] of regression in wound condition at [phone]. o Please direct any NON-WOUND related issues/requests for orders to patient's Primary Care Physician Wound #3 [place] Nurse may visit PRN to address patientos wound care needs. o FACE TO FACE ENCOUNTER: MEDICARE and MEDICAID PATIENTS: I certify that this patient is under my care and that I had a face-to-face encounter that meets the physician face-to-face encounter requirements with this patient on this date. The encounter with the patient was in whole or in part for the following MEDICAL CONDITION: (primary reason for [place]) MEDICAL NECESSITY: I certify, that based on my findings, NURSING services are a medically necessary home health service. HOME BOUND STATUS: I certify that my clinical findings support that this patient is homebound (i.e., Due to illness or injury, pt requires aid of supportive devices such as crutches, cane, wheelchairs, walkers, the use of special transportation or the assistance of  another person to leave their place of residence. There is a normal inability to leave the home and doing so requires considerable and taxing effort. Other absences are for medical reasons / religious services and are infrequent or of short duration when for other reasons). o If current dressing causes regression in wound condition, may D/C ordered dressing product/s and apply Normal Saline Moist Dressing daily until next [place] / Other MD appointment. [place] of regression in wound condition at [phone]. o Please direct any NON-WOUND related issues/requests for orders to patient's Primary Care Physician Wound #4 Right,Proximal,Lateral Lower Leg o [place] Nurse may visit PRN to address patientos wound care needs. o FACE TO FACE ENCOUNTER: MEDICARE and MEDICAID PATIENTS: I certify that this patient is under my care and that I had a face-to-face encounter that meets the physician face-to-face encounter requirements with this patient on this date. The encounter with  the patient was in whole or in part for the following MEDICAL CONDITION: (primary reason for [place]) MEDICAL NECESSITY: I certify, that based on my findings, NURSING services are a medically necessary home health service. HOME BOUND STATUS: I certify that my clinical findings support that this patient is homebound (i.e., Due to illness or injury, pt requires aid of supportive devices such as crutches, cane, wheelchairs, walkers, the use of special transportation or the assistance of another person to leave their place of residence. There is a normal inability to leave the home and doing so requires considerable and taxing effort. Other absences are for medical reasons / religious services and are infrequent or of short duration when for other reasons). o If current dressing causes regression in wound condition, may D/C ordered dressing product/s  and apply Normal Saline Moist Dressing daily until next [place] / Other MD appointment. [place] of regression in wound condition at [phone]. o Please direct any NON-WOUND related issues/requests for orders to patient's Primary Care Physician Wound #5 Right,Distal,Lateral Lower Leg o [place] Nurse may visit PRN to address patientos wound care needs. o FACE TO FACE ENCOUNTER: MEDICARE and MEDICAID PATIENTS: I certify that this patient is under my care and that I had a face-to-face encounter that meets the physician face-to-face encounter requirements with this patient on this date. The encounter with the patient was in whole or in part for the following MEDICAL CONDITION: (primary reason for [place]) MEDICAL NECESSITY: I certify, that based on my findings, NURSING services are a medically necessary home health service. HOME BOUND STATUS: I certify that my clinical findings support that this patient is homebound (i.e., Due to illness or injury, pt requires aid of supportive devices such as crutches, cane, wheelchairs, walkers, the use of special transportation or the Gallardo, [name]. ([id number]) assistance of another person to leave their place of residence. There is a normal inability to leave the home and doing so requires considerable and taxing effort. Other absences are for medical reasons / religious services and are infrequent or of short duration when for other reasons). o If current dressing causes regression in wound condition, may D/C ordered dressing product/s and apply Normal Saline Moist Dressing daily until next [place] / Other MD appointment. [place] of regression in wound condition at [phone]. o Please direct any NON-WOUND related issues/requests for orders to patient's Primary Care Physician Wound #6 [place] Nurse may visit PRN to address patientos wound care needs. o FACE TO FACE ENCOUNTER: MEDICARE and MEDICAID PATIENTS: I certify that this patient is under my care and that I had a face-to-face encounter that meets the physician face-to-face encounter requirements with this patient on this date. The encounter with the patient was in whole or in part for the following MEDICAL CONDITION: (primary reason for [place]) MEDICAL NECESSITY: I certify, that based on my findings, NURSING services are a medically necessary home health service. HOME BOUND STATUS: I certify that my clinical findings support that this patient is homebound (i.e., Due to illness or injury, pt requires aid of supportive devices such as crutches, cane, wheelchairs, walkers, the use of special transportation or the assistance of another person to leave their place of residence. There is a normal inability to leave the home and doing so requires considerable and taxing effort. Other absences are for medical reasons / religious  services and are infrequent or of short duration when for other reasons). o If current dressing causes regression in wound condition, may D/C ordered dressing product/s and apply Normal Saline Moist Dressing daily until next [place] / Other MD appointment. [place] of regression in wound condition at [phone]. o Please direct any NON-WOUND related issues/requests for orders to patient's Primary Care Physician Electronic Signature(s) Signed: 04/15/2017 2:55:02 PM By: Gretta Cool, BSN, RN, CWS, Kim RN, BSN Signed: 04/15/2017 5:02:04 PM By: Linton Ham MD Entered By: Gretta Cool, BSN, RN, CWS, Kim on 04/15/2017 14:18:07 Teal, [name] ([id number]) -------------------------------------------------------------------------------- Problem List Details Patient Name: [name] Date of Service: 04/15/2017 1:30 PM Medical Record Number:  [id number] Patient Account Number: [id number] Date of Birth/Sex: [birth year] (76 y.o. 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 M) Treating RN: Cornell Barman Primary Care Provider: Cyndi Bender Other Clinician: Referring Provider: Cyndi Bender Treating Provider/Extender: Tito Dine in Treatment: 1 Subjective History of Present Illness (HPI) 04/08/17; this is a complex 77 year old man referred here from [facility]. He had been referred there for bilateral lower extremity edema with ulcer formation predominantly on the right calf but also the right [name]. He had been receiving Unna boots bilaterally. The history here is long. He is not a diabetic however ICU looking through late 2018 he was worked up for chronic headaches, elevated inflammatory markers including C-reactive protein and ESR. He went on to actually have a  left temporal artery biopsy wasn't that was negative.he received about 6 weeks of high-dose prednisone 60 mg with improvement in his inflammatory markers. He was admitted to hospital in late November with ventricular tachycardia syncope. He has known ischemic cardiomyopathy. He was admitted in the hospital in mid December. Apparently this was precipitated by a syncopal spell falling out of his scooter while at [place]. There was ventricular arrhythmia. He has an implantable defibrillator and echocardiogram showed severe LV dysfunction with an EF of 20% and valvular regurgitations including mild AR, moderate MR. There was no stenosis. He ruled in for a non-ST elevation MI in the setting of V. tach.his wife states that sometime during this hospitalization she noted multiple areas of skin change on the right lower calf which became evident just after he left the hospital. He was back in hospital in February with acute renal failure hyponatremia. This responded to fluid resuscitation.. Interestingly I can't see much description of his right leg at that point in time.he was followed by Dr. Nehemiah Massed of dermatology for the necrotic wounds on his right leg. Apparently a biopsy was planned at one point but not done although in some notes that suggests it was. I cannot see these  results area He was noted to have a lot of edema. Was treated with bilateral Unna boots edges really helped with the swelling they have been using Bactroban to small open areas predominantly on the right anterior lower leg His history is complicated by the fact that he has rheumatoid arthritis followed by rheumatology. He is followed by neurology for disabling headaches. At one point this was felt to be giant cell arteritis although a left temporal biopsy was apparently negative. He was given a prolonged course of prednisone at 60 mg which managed his sedimentation rates but apparently did not prove improve the headaches. This is  been tapered to off on by rheumatology on 03/13/17 Vascular had plans to do a venous reflux workup as well as arterial studies in May. They also wanted to get him a lymphedema pump. As mentioned he's been using bacitracin under Unna boot wraps to both lower legs 04/15/17; the patient arrives with most of his wounds improved. These are small punched out wounds. Most of them remaining ones are on the right anterior calf with the most problematic over the right lateral malleolus. There are no new areas. The symptom complex or potential symptom complex we are dealing with his chronic disabling headaches with inflammatory markers not responsive to prednisone and with a negative temporal biopsy, lower extremity weakness, skin ulcerations just on the right leg. We have managed to get his arterial studies moved up to April 30. He has a rheumatology consult at [facility] in June. He has seen dermatology locally, rheumatology locally, neurology locally. ARLISS, FRISINA ([id number]) Objective Constitutional Sitting or standing Blood Pressure is within target range for patient.. Pulse regular and within target range for patient.[name] Respirations regular, non-labored and within target range.. Temperature is normal and within the target range for the patient.[name] appears in no distress. Vitals Time Taken: 1:51 PM, Height: 68 in, Temperature: 97.6 F, Pulse: 68 bpm, Respiratory Rate: 20 breaths/min, Blood Pressure: 101/63 mmHg. General Notes: wound exam; wounds are on the anterior right tibial area that is still open. More problematic lady on the right lateral malleolus. The area on the right lateral [name] appears to have closed. Most of the areas on the right posterior calf were already closed when he came here. Several of these areas including the right lateral malleolus and 4 on the right anterior calf have adherent surface debris. Using a #3 curet I removed the subcutaneous tissue. Hemostasis with direct pressure most  of these that are left with a healthy-looking base. The area over the right lateral malleolus I think will still be problematic. According to patient he does not have any other wound areas in any other location Integumentary (Hair, Skin) Wound #1 status is Open. Original cause of wound was Gradually Appeared. The wound is located on the Right,Proximal,Medial Lower Leg. The wound measures 0.2cm length x 0.2cm width x 0.1cm depth; 0.031cm^2 area and 0.003cm^3 volume. There is no tunneling or undermining noted. There is a large amount of sanguinous drainage noted. The wound margin is distinct with the outline attached to the wound base. There is no granulation within the wound bed. There is a large (67-100%) amount of necrotic tissue within the wound bed including Eschar and Adherent Slough. The periwound skin appearance did not exhibit: Callus, Crepitus, Excoriation, Induration, Rash, Scarring, Dry/Scaly, Maceration, Atrophie Blanche, Cyanosis, Ecchymosis, Hemosiderin Staining, Mottled, Pallor, Rubor, Erythema. Periwound temperature was noted as No Abnormality. The periwound has tenderness on palpation. Wound #2 status is Open. Original cause of wound  was Gradually Appeared. The wound is located on the Right,Distal,Medial Lower Leg. The wound measures 0.3cm length x 0.2cm width x 0.1cm depth; 0.047cm^2 area and 0.005cm^3 volume. There is no tunneling or undermining noted. There is a large amount of sanguinous drainage noted. The wound margin is flat and intact. There is medium (34-66%) red granulation within the wound bed. There is a medium (34-66%) amount of necrotic tissue within the wound bed. The periwound skin appearance did not exhibit: Callus, Crepitus, Excoriation, Induration, Rash, Scarring, Dry/Scaly, Maceration, Atrophie Blanche, Cyanosis, Ecchymosis, Hemosiderin Staining, Mottled, Pallor, Rubor, Erythema. Periwound temperature was noted as No Abnormality. The periwound has tenderness on  palpation. Wound #3 status is Open. Original cause of wound was Gradually Appeared. The wound is located on the Right,Anterior Lower Leg. The wound measures 0.5cm length x 0.7cm width x 0.2cm depth; 0.275cm^2 area and 0.055cm^3 volume. There is Fat Layer (Subcutaneous Tissue) Exposed exposed. There is no tunneling or undermining noted. There is a large amount of sanguinous drainage noted. The wound margin is flat and intact. There is medium (34-66%) red granulation within the wound bed. There is a medium (34-66%) amount of necrotic tissue within the wound bed including Eschar and Adherent Slough. The periwound skin appearance did not exhibit: Callus, Crepitus, Excoriation, Induration, Rash, Scarring, Dry/Scaly, Maceration, Atrophie Blanche, Cyanosis, Ecchymosis, Hemosiderin Staining, Mottled, Pallor, Rubor, Erythema. Periwound temperature was noted as No Abnormality. Wound #4 status is Open. Original cause of wound was Gradually Appeared. The wound is located on the Right,Proximal,Lateral Lower Leg. The wound measures 0.3cm length x 0.3cm width x 0.1cm depth; 0.071cm^2 area and 0.007cm^3 volume. There is no tunneling or undermining noted. There is a large amount of sanguinous drainage noted. The wound margin is flat and intact. There is large (67-100%) red granulation within the wound bed. There is no necrotic tissue within the wound bed. The periwound skin appearance exhibited: Erythema. The surrounding wound skin color is noted with erythema which is circumferential. Periwound temperature was noted as No Abnormality. The periwound has tenderness on palpation. Wound #5 status is Open. Original cause of wound was Gradually Appeared. The wound is located on the Right,Distal,Lateral Lower Leg. The wound measures 0.4cm length x 0.5cm width x 0.1cm depth; 0.157cm^2 area and 0.016cm^3 volume. There is no tunneling or undermining noted. There is a none present amount of drainage noted. The wound margin  is flat and intact. There is medium (34-66%) red granulation within the wound bed. There is a medium (34-66%) amount of necrotic tissue [name], Jermaine E. ([id number]) within the wound bed including Eschar and Adherent Slough. The periwound skin appearance exhibited: Erythema. The surrounding wound skin color is noted with erythema which is circumferential. Periwound temperature was noted as No Abnormality. The periwound has tenderness on palpation. Wound #6 status is Open. Original cause of wound was Gradually Appeared. The wound is located on the Right,Lateral Malleolus. The wound measures 0.4cm length x 0.4cm width x 0.2cm depth; 0.126cm^2 area and 0.025cm^3 volume. There is Fat Layer (Subcutaneous Tissue) Exposed exposed. There is no tunneling or undermining noted. There is a none present amount of drainage noted. The wound margin is flat and intact. There is no granulation within the wound bed. There is a large (67-100%) amount of necrotic tissue within the wound bed including Eschar. The periwound skin appearance did not exhibit: Callus, Crepitus, Excoriation, Induration, Rash, Scarring, Dry/Scaly, Maceration, Atrophie Blanche, Cyanosis, Ecchymosis, Hemosiderin Staining, Mottled, Pallor, Rubor, Erythema. 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 There was a Excisional Skin/Subcutaneous Tissue Debridement with a total area of 0.35 sq cm performed by Ricard Dillon, MD. With the following instrument(s): Curette. to  remove Viable and Non-Viable tissue/material Material removed includes Subcutaneous Tissue, and [place] after achieving pain control using Other (lidocaine 4%). No specimens were taken. A time out was conducted at 14:15, prior to the start of the procedure. A Minimum amount of bleeding was controlled with Pressure. The procedure was tolerated well with a pain level of 0 throughout and a pain level of 0 following the procedure. Post Debridement Measurements: 0.5cm length x 0.7cm width x 0.3cm depth; 0.082cm^3 volume. Character of Wound/Ulcer Post Debridement is stable. Severity of Tissue Post Debridement is: Fat layer exposed. Post procedure Diagnosis Wound #3: Same as Pre-Procedure Wound #5 Pre-procedure diagnosis of Wound #5 is an Arterial Insufficiency Ulcer located on the Right,Distal,Lateral Lower Leg .Severity of Tissue Pre Debridement is: Fat layer exposed. There was a Excisional Skin/Subcutaneous Tissue Debridement with a total area of 0.2 sq cm performed by Ricard Dillon, MD. With the following instrument(s): Curette. to remove Viable and Non-Viable tissue/material Material removed includes Subcutaneous Tissue, and [place] after achieving pain control using Other (lidocaine 4%). No specimens were taken. A time out was conducted at 14:15, prior to the start of the procedure. A Minimum amount of bleeding was controlled with Pressure. The procedure was tolerated well with a pain level of 0 throughout and a pain level of 0 following the procedure. Post Debridement Measurements: 0.4cm length x 0.5cm width x 0.2cm depth; 0.031cm^3 volume. Character of Wound/Ulcer Post Debridement is stable. Severity of Tissue Post Debridement is: Fat layer exposed. Post procedure Diagnosis Wound #5: Same as Pre-Procedure [place], [name]. ([id number]) Wound #6 Pre-procedure diagnosis of Wound #6 is an Arterial Insufficiency Ulcer located on the Right,Lateral Malleolus .Severity  of Tissue Pre Debridement is: Fat layer exposed. There was a Excisional Skin/Subcutaneous Tissue Debridement with a total area of 0.16 sq cm performed by Ricard Dillon, MD. With the following instrument(s): Curette. to remove Viable and Non-Viable tissue/material Material removed includes Subcutaneous Tissue, and [place] after achieving pain control using Other (lidocaine 4%). No specimens were taken. A time out was conducted at 14:15, prior to the start of the procedure. A Minimum amount of bleeding was controlled with Pressure. The procedure was tolerated well with a pain level of 0 throughout and a pain level of 0 following the procedure. Post Debridement Measurements: 0.4cm length x 0.4cm width x 0.2cm depth; 0.025cm^3 volume. Character of Wound/Ulcer Post Debridement is stable. Severity of Tissue Post Debridement is: Fat layer exposed. Post procedure Diagnosis Wound #6: Same as Pre-Procedure Plan Wound Cleansing: Wound #1 Right,Proximal,Medial Lower Leg: Clean wound with Normal Saline. Wound #2 Right,Distal,Medial Lower Leg: Clean wound with Normal Saline. Wound #3 Right,Anterior Lower Leg: Clean wound with Normal Saline. Wound #4 Right,Proximal,Lateral Lower Leg: Clean wound with Normal Saline. Wound #5 Right,Distal,Lateral Lower Leg: Clean wound with Normal Saline. Wound #6 Right,Lateral Malleolus: Clean wound with Normal Saline. Anesthetic (add to Medication List): Wound #  1 Right,Proximal,Medial Lower Leg: Topical Lidocaine 4% cream applied to wound bed prior to debridement (In Clinic Only). Wound #2 Right,Distal,Medial Lower Leg: Topical Lidocaine 4% cream applied to wound bed prior to debridement (In Clinic Only). Wound #3 Right,Anterior Lower Leg: Topical Lidocaine 4% cream applied to wound bed prior to debridement (In Clinic Only). Wound #4 Right,Proximal,Lateral Lower Leg: Topical Lidocaine 4% cream applied to wound bed prior to debridement (In Clinic  Only). Wound #5 Right,Distal,Lateral Lower Leg: Topical Lidocaine 4% cream applied to wound bed prior to debridement (In Clinic Only). Wound #6 Right,Lateral Malleolus: Topical Lidocaine 4% cream applied to wound bed prior to debridement (In Clinic Only). Primary Wound Dressing: Wound #1 Right,Proximal,Medial Lower Leg: under 3K impression Silver Collagen - all wounds Wound #2 Right,Distal,Medial Lower Leg: Silver Collagen - all wounds Wound #3 Right,Anterior Lower Leg: Silver Collagen - all wounds Wound #4 Right,Proximal,Lateral Lower Leg: Silver Collagen - all wounds Wound #5 Right,Distal,Lateral Lower Leg: Silver Collagen - all wounds Wound #6 Right,Lateral Malleolus: Mehrer, DUSTEN ELLINWOOD ([id number]) Silver Collagen - all wounds Secondary Dressing: Wound #1 Right,Proximal,Medial Lower Leg: ABD pad - over collegen, Unna paste to Anchor at top Wound #2 Right,Distal,Medial Lower Leg: ABD pad - over collegen, Unna paste to Anchor at top Wound #3 Right,Anterior Lower Leg: ABD pad - over collegen, Unna paste to Anchor at top Wound #4 Right,Proximal,Lateral Lower Leg: ABD pad - over collegen, Unna paste to Anchor at top Wound #5 Right,Distal,Lateral Lower Leg: ABD pad - over collegen, Unna paste to Anchor at top Wound #6 Right,Lateral Malleolus: ABD pad - over collegen, Unna paste to Anchor at top Dressing Change Frequency: Wound #1 Right,Proximal,Medial Lower Leg: Change Dressing Monday, Wednesday, Friday Wound #2 Right,Distal,Medial Lower Leg: Change Dressing Monday, Wednesday, Friday Wound #3 Right,Anterior Lower Leg: Change Dressing Monday, Wednesday, Friday Wound #4 Right,Proximal,Lateral Lower Leg: Change Dressing Monday, Wednesday, Friday Wound #5 Right,Distal,Lateral Lower Leg: Change Dressing Monday, Wednesday, Friday Wound #6 Right,Lateral Malleolus: Change Dressing Monday, Wednesday, Friday Follow-up Appointments: Wound #1 Right,Proximal,Medial Lower Leg: Return  Appointment in 1 week. Wound #2 Right,Distal,Medial Lower Leg: Return Appointment in 1 week. Wound #3 Right,Anterior Lower Leg: Return Appointment in 1 week. Wound #4 Right,Proximal,Lateral Lower Leg: Return Appointment in 1 week. Wound #5 Right,Distal,Lateral Lower Leg: Return Appointment in 1 week. Wound #6 Right,Lateral Malleolus: Return Appointment in 1 week. Edema Control: Wound #1 Right,Proximal,Medial Lower Leg: 3 Layer Compression System - Right Lower Extremity Wound #2 Right,Distal,Medial Lower Leg: 3 Layer Compression System - Right Lower Extremity Wound #3 Right,Anterior Lower Leg: 3 Layer Compression System - Right Lower Extremity Wound #4 Right,Proximal,Lateral Lower Leg: 3 Layer Compression System - Right Lower Extremity Wound #5 Right,Distal,Lateral Lower Leg: 3 Layer Compression System - Right Lower Extremity Wound #6 Right,Lateral Malleolus: 3 Layer Compression System - Right Lower Extremity Home Health: Wound #1 Right,Proximal,Medial Lower Leg: [place] Nurse may visit PRN to address patient s wound care needs. FACE TO FACE ENCOUNTER: MEDICARE and MEDICAID PATIENTS: I certify that this patient is under my care and that I had a face-to-face encounter that meets the physician face-to-face encounter requirements with this patient on this date. The encounter with the patient was in whole or in part for the following MEDICAL CONDITION: (primary reason for Home Babineaux, [name] ([id number]) Healthcare) MEDICAL NECESSITY: I certify, that based on my findings, NURSING services are a medically necessary home health service. HOME BOUND STATUS: I certify that my clinical findings support that this patient is  homebound (i.e., Due to illness or injury, pt requires aid of supportive devices such as crutches, cane, wheelchairs, walkers, the use of special transportation or the assistance of another person to leave their place of residence. There  is a normal inability to leave the home and doing so requires considerable and taxing effort. Other absences are for medical reasons / religious services and are infrequent or of short duration when for other reasons). If current dressing causes regression in wound condition, may D/C ordered dressing product/s and apply Normal Saline Moist Dressing daily until next [place] / Other MD appointment. [place] of regression in wound condition at [phone]. Please direct any NON-WOUND related issues/requests for orders to patient's Primary Care Physician Wound #2 Right,Distal,Medial Lower Leg: [place] Nurse may visit PRN to address patient s wound care needs. FACE TO FACE ENCOUNTER: MEDICARE and MEDICAID PATIENTS: I certify that this patient is under my care and that I had a face-to-face encounter that meets the physician face-to-face encounter requirements with this patient on this date. The encounter with the patient was in whole or in part for the following MEDICAL CONDITION: (primary reason for [place]) MEDICAL NECESSITY: I certify, that based on my findings, NURSING services are a medically necessary home health service. HOME BOUND STATUS: I certify that my clinical findings support that this patient is homebound (i.e., Due to illness or injury, pt requires aid of supportive devices such as crutches, cane, wheelchairs, walkers, the use of special transportation or the assistance of another person to leave their place of residence. There is a normal inability to leave the home and doing so requires considerable and taxing effort. Other absences are for medical reasons / religious services and are infrequent or of short duration when for other reasons). If current dressing causes regression in wound condition, may D/C ordered dressing product/s and apply Normal Saline Moist Dressing daily until next [place] /  Other MD appointment. [place] of regression in wound condition at [phone]. Please direct any NON-WOUND related issues/requests for orders to patient's Primary Care Physician Wound #3 Right,Anterior Lower Leg: [place] Nurse may visit PRN to address patient s wound care needs. FACE TO FACE ENCOUNTER: MEDICARE and MEDICAID PATIENTS: I certify that this patient is under my care and that I had a face-to-face encounter that meets the physician face-to-face encounter requirements with this patient on this date. The encounter with the patient was in whole or in part for the following MEDICAL CONDITION: (primary reason for [place]) MEDICAL NECESSITY: I certify, that based on my findings, NURSING services are a medically necessary home health service. HOME BOUND STATUS: I certify that my clinical findings support that this patient is homebound (i.e., Due to illness or injury, pt requires aid of supportive devices such as crutches, cane, wheelchairs, walkers, the use of special transportation or the assistance of another person to leave their place of residence. There is a normal inability to leave the home and doing so requires considerable and taxing effort. Other absences are for medical reasons / religious services and are infrequent or of short duration when for other reasons). If current dressing causes regression in wound condition, may D/C ordered dressing product/s and apply Normal Saline Moist Dressing daily until next [place] / Other MD appointment. [place] of regression in wound condition at [phone]. Please direct any NON-WOUND related issues/requests for orders to  patient's Primary Care Physician Wound #4 Right,Proximal,Lateral Lower Leg: [place] Nurse may visit PRN to address patient s wound care needs. FACE TO FACE ENCOUNTER: MEDICARE and MEDICAID PATIENTS:  I certify that this patient is under my care and that I had a face-to-face encounter that meets the physician face-to-face encounter requirements with this patient on this date. The encounter with the patient was in whole or in part for the following MEDICAL CONDITION: (primary reason for [place]) MEDICAL NECESSITY: I certify, that based on my findings, NURSING services are a medically necessary home health service. HOME BOUND STATUS: I certify that my clinical findings support that this patient is homebound (i.e., Due to illness or injury, pt requires aid of supportive devices such as crutches, cane, wheelchairs, walkers, the use of special transportation or the assistance of another person to leave their place of residence. There is a normal inability to leave the home and doing so requires considerable and taxing effort. Other absences are for medical reasons / religious services and are infrequent or of short duration when for other reasons). If current dressing causes regression in wound condition, may D/C ordered dressing product/s and apply Normal Saline Moist Dressing daily until next [place] / Other MD appointment. [place] of regression in wound condition at [phone]. Please direct any NON-WOUND related issues/requests for orders to patient's Primary Care Physician Wound #5 Right,Distal,Lateral Lower Leg: [place] Visits COURY, GRIEGER ([id number]) Home Health Nurse may visit PRN to address patient s wound care needs. FACE TO FACE ENCOUNTER: MEDICARE and MEDICAID PATIENTS: I certify that this patient is under my care and that I had a face-to-face encounter that meets the physician face-to-face encounter requirements with this patient on this date. The encounter with the patient was in whole or in part for the following MEDICAL CONDITION: (primary reason for [place]) MEDICAL NECESSITY: I certify, that based on my  findings, NURSING services are a medically necessary home health service. HOME BOUND STATUS: I certify that my clinical findings support that this patient is homebound (i.e., Due to illness or injury, pt requires aid of supportive devices such as crutches, cane, wheelchairs, walkers, the use of special transportation or the assistance of another person to leave their place of residence. There is a normal inability to leave the home and doing so requires considerable and taxing effort. Other absences are for medical reasons / religious services and are infrequent or of short duration when for other reasons). If current dressing causes regression in wound condition, may D/C ordered dressing product/s and apply Normal Saline Moist Dressing daily until next [place] / Other MD appointment. [place] of regression in wound condition at [phone]. Please direct any NON-WOUND related issues/requests for orders to patient's Primary Care Physician Wound #6 Right,Lateral Malleolus: [place] Nurse may visit PRN to address patient s wound care needs. FACE TO FACE ENCOUNTER: MEDICARE and MEDICAID PATIENTS: I certify that this patient is under my care and that I had a face-to-face encounter that meets the physician face-to-face encounter requirements with this patient on this date. The encounter with the patient was in whole or in part for the following MEDICAL CONDITION: (primary reason for [place]) MEDICAL NECESSITY: I certify, that based on my findings, NURSING services are a medically necessary home health service. HOME BOUND STATUS: I certify that my clinical findings support that this patient is homebound (i.e., Due  to illness or injury, pt requires aid of supportive devices such as crutches, cane, wheelchairs, walkers, the use of special transportation or the assistance of another person to leave their place of residence. There is a  normal inability to leave the home and doing so requires considerable and taxing effort. Other absences are for medical reasons / religious services and are infrequent or of short duration when for other reasons). If current dressing causes regression in wound condition, may D/C ordered dressing product/s and apply Normal Saline Moist Dressing daily until next [place] / Other MD appointment. [place] of regression in wound condition at [phone]. Please direct any NON-WOUND related issues/requests for orders to patient's Primary Care Physician #1we continued with silver collagen to all the open wound areas #2 if the patient develops subsequent wounds or especially wounds in another location anatomically these will definitely need to be biopsied. #3 tolerated 3 alert compression and most of these areas look better. #4 I'm really at a loss to explain this if this were a vasculitis or vasculopathy of probably would be on both legs at least Electronic Signature(s) Signed: 04/15/2017 5:02:04 PM By: Linton Ham MD Entered By: Linton Ham on 04/15/2017 14:40:12 [place], [name] ([id number]) -------------------------------------------------------------------------------- SuperBill Details Patient Name: [name] Date of Service: 04/15/2017 Medical Record Number: [id number] Patient Account Number: [id number] Date of Birth/Sex: July 31, 1940 (76 y.o. 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 limited to breakdown of skin I25.5  Ischemic cardiomyopathy Facility Procedures CPT4 Code Description: [id number] - DEB SUBQ TISSUE 20 SQ CM/< ICD-10 Diagnosis Description 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

## 2017-04-17 NOTE — Progress Notes (Signed)
[name], [name] ([id number]) Visit Report for 04/15/2017 Arrival Information Details Patient Name: [name], [name] Date of Service: 04/15/2017 1:30 PM Medical Record Number: [id number] Patient Account Number: [id number] Date of Birth/Sex: 1940/04/07 (76 y.o. M) Treating RN: Montey Hora Primary Care Bresha Hosack: Cyndi Bender Other Clinician: Referring Kathan Kirker: Cyndi Bender Treating Jayziah Bankhead/Extender: Tito Dine in Treatment: 1 Visit Information History Since Last Visit Added or deleted any medications: No Patient Arrived: Walker Any new allergies or adverse reactions: No Arrival Time: 13:49 Had a fall or experienced change in No Accompanied By: souse activities of daily living that may affect Transfer Assistance: None risk of falls: Patient Identification Verified: Yes Signs or symptoms of abuse/neglect since last visito No Secondary Verification Process Completed: Yes Hospitalized since last visit: No Patient Requires Transmission-Based No Implantable device outside of the clinic excluding No Precautions: cellular tissue based products placed in the center Patient Has Alerts: Yes since last visit: Patient Alerts: R ABI Has Dressing in Place as Prescribed: Yes >220 Has Compression in Place as Prescribed: Yes Pain Present Now: No Electronic Signature(s) Signed: 04/15/2017 5:03:40 PM By: Montey Hora Entered By: Montey Hora on 04/15/2017 13:51:45 Fetterolf, [name] ([id number]) -------------------------------------------------------------------------------- Encounter Discharge Information Details Patient Name: [name] Date of Service: 04/15/2017 1:30 PM Medical Record Number: [id number] Patient Account Number: [id number] Date of Birth/Sex: 1940-02-26 (76 y.o. M) Treating RN: Cornell Barman Primary Care Ramon Brant: Cyndi Bender Other Clinician: Referring Karrah Mangini: Cyndi Bender Treating Anaise Sterbenz/Extender: Tito Dine in  Treatment: 1 Encounter Discharge Information Items Discharge Pain Level: 0 Discharge Condition: Stable Ambulatory Status: Walker Discharge Destination: Home Transportation: Private Auto Accompanied By: wife Schedule Follow-up Appointment: Yes Medication Reconciliation completed and No provided to Patient/Care Inga Noller: Provided on Clinical Summary of Care: 04/15/2017 Form Type Recipient Paper Patient LM Electronic Signature(s) Signed: 04/15/2017 2:55:02 PM By: Gretta Cool, BSN, RN, CWS, Kim RN, BSN Entered By: Gretta Cool, BSN, RN, CWS, Kim on 04/15/2017 14:35:48 Shimko, [name] ([id number]) -------------------------------------------------------------------------------- Lower Extremity Assessment Details Patient Name: [name] Date of Service: 04/15/2017 1:30 PM Medical Record Number: [id number] Patient Account Number: [id number] Date of Birth/Sex: 12/17/1940 (76 y.o. M) Treating RN: Montey Hora Primary Care Dulse Rutan: Cyndi Bender Other Clinician: Referring Ryver Zadrozny: Cyndi Bender Treating Daylynn Stumpp/Extender: Tito Dine in Treatment: 1 Edema Assessment Assessed: [Left: No] [Right: No] [Left: Edema] [Right: :] Calf Left: Right: Point of Measurement: 27 cm From Medial Instep cm 30.6 cm Ankle Left: Right: Point of Measurement: 11 cm From Medial Instep cm 22.5 cm Vascular Assessment Pulses: Dorsalis Pedis Palpable: [Right:Yes] Posterior Tibial Extremity colors, hair growth, and conditions: Extremity Color: [Right:Mottled] Hair Growth on Extremity: [Right:No] Temperature of Extremity: [Right:Warm] Capillary Refill: [Right:< 3 seconds] Electronic Signature(s) Signed: 04/15/2017 5:03:40 PM By: Montey Hora Entered By: Montey Hora on 04/15/2017 14:04:27 Tunks, [name] ([id number]) -------------------------------------------------------------------------------- Multi Wound Chart Details Patient Name: [name] Date of Service: 04/15/2017  1:30 PM Medical Record Number: [id number] Patient Account Number: [id number] Date of Birth/Sex: [birth year] (76 y.o. M) Treating RN: Cornell Barman Primary Care Dorance Spink: Cyndi Bender Other Clinician: Referring Analiah Drum: Cyndi Bender Treating Nikya Busler/Extender: Tito Dine in Treatment: 1 Vital Signs Height(in): 68 Pulse(bpm): 62 Weight(lbs): Blood Pressure(mmHg): 101/63 Body Mass Index(BMI): Temperature(F): 97.6 Respiratory Rate 20 (breaths/min): Photos: [1:No Photos] [2:No Photos] [3:No Photos] Wound Location: [1:Right Lower Leg - Medial, Proximal] [2:Right Lower Leg - Medial, Distal] [3:Right Lower Leg - Anterior] Wounding Event: [1:Gradually Appeared] [2:Gradually Appeared] [3:Gradually Appeared] Primary Etiology: [1:Arterial Insufficiency Ulcer] [2:Arterial Insufficiency Ulcer] [  3:Arterial Insufficiency Ulcer] Comorbid History: [1:Congestive Heart Failure, Coronary Artery Disease, Hypertension, Myocardial Infarction, Osteoarthritis] [2:Congestive Heart Failure, Coronary Artery Disease, Hypertension, Myocardial Infarction, Osteoarthritis] [3:Congestive Heart  Failure, Coronary Artery Disease, Hypertension, Myocardial Infarction, Osteoarthritis] Date Acquired: [1:01/15/2017] [2:01/15/2017] [3:01/15/2017] Weeks of Treatment: [1:1] [2:1] [3:1] Wound Status: [1:Open] [2:Open] [3:Open] Clustered Quantity: [1:9] [2:5] [3:N/A] Measurements L x W x D [1:0.2x0.2x0.1] [2:0.3x0.2x0.1] [3:0.5x0.7x0.2] (cm) Area (cm) : [1:0.031] [2:0.047] [3:0.275] Volume (cm) : [1:0.003] [2:0.005] [3:0.055] % Reduction in Area: [1:86.90%] [2:75.00%] [3:44.40%] % Reduction in Volume: [1:87.50%] [2:86.80%] [3:62.80%] Classification: [1:Full Thickness Without Exposed Support Structures] [2:Full Thickness Without Exposed Support Structures] [3:Full Thickness Without Exposed Support Structures] Exudate Amount: [1:Large] [2:Large] [3:Large] Exudate Type: [1:Sanguinous] [2:Sanguinous]  [3:Sanguinous] Exudate Color: [1:red] [2:red] [3:red] Wound Margin: [1:Distinct, outline attached] [2:Flat and Intact] [3:Flat and Intact] Granulation Amount: [1:None Present (0%)] [2:Medium (34-66%)] [3:Medium (34-66%)] Granulation Quality: [1:N/A] [2:Red] [3:Red] Necrotic Amount: [1:Large (67-100%)] [2:Medium (34-66%)] [3:Medium (34-66%)] Necrotic Tissue: [1:Eschar, Adherent Slough] [2:N/A] [3:Eschar, Adherent Slough] Exposed Structures: [1:Fascia: No Fat Layer (Subcutaneous Tissue) Exposed: No Tendon: No Muscle: No Joint: No Bone: No] [2:Fascia: No Fat Layer (Subcutaneous Tissue) Exposed: No Tendon: No Muscle: No Joint: No Bone: No] [3:Fat Layer (Subcutaneous Tissue) Exposed: Yes  Fascia: No Tendon: No Muscle: No Joint: No Bone: No] Epithelialization: [1:None] [2:None] [3:None] Debridement: N/A N/A Debridement - Excisional Pre-procedure N/A N/A 14:15 Verification/Time Out Taken: Pain Control: N/A N/A Other Tissue Debrided: N/A N/A Subcutaneous, Slough Level: N/A N/A Skin/Subcutaneous Tissue Debridement Area (sq cm): N/A N/A 0.35 Instrument: N/A N/A Curette Bleeding: N/A N/A Minimum Hemostasis Achieved: N/A N/A Pressure Procedural Pain: N/A N/A 0 Post Procedural Pain: N/A N/A 0 Debridement Treatment N/A N/A Procedure was tolerated well Response: Post Debridement N/A N/A 0.5x0.7x0.3 Measurements L x W x D (cm) Post Debridement Volume: N/A N/A 0.082 (cm) Periwound Skin Texture: Excoriation: No Excoriation: No Excoriation: No Induration: No Induration: No Induration: No Callus: No Callus: No Callus: No Crepitus: No Crepitus: No Crepitus: No Rash: No Rash: No Rash: No Scarring: No Scarring: No Scarring: No Periwound Skin Moisture: Maceration: No Maceration: No Maceration: No Dry/Scaly: No Dry/Scaly: No Dry/Scaly: No Periwound Skin Color: Atrophie Blanche: No Atrophie Blanche: No Atrophie Blanche: No Cyanosis: No Cyanosis: No Cyanosis: No Ecchymosis:  No Ecchymosis: No Ecchymosis: No Erythema: No Erythema: No Erythema: No Hemosiderin Staining: No Hemosiderin Staining: No Hemosiderin Staining: No Mottled: No Mottled: No Mottled: No Pallor: No Pallor: No Pallor: No Rubor: No Rubor: No Rubor: No Erythema Location: N/A N/A N/A Temperature: No Abnormality No Abnormality No Abnormality Tenderness on Palpation: Yes Yes No Wound Preparation: Ulcer Cleansing: Ulcer Cleansing: Ulcer Cleansing: Other: soap Rinsed/Irrigated with Saline, Rinsed/Irrigated with Saline, and water Other: soap and water Other: soap and water Topical Anesthetic Applied: Topical Anesthetic Applied: Topical Anesthetic Applied: Other: lidocaine 4% Other: lidocaine 4% Other: lidocaine 4% Procedures Performed: N/A N/A Debridement Wound Number: [id number]  Photos: No Photos No Photos No Photos Wound Location: Right Lower Leg - Lateral, Right Lower Leg - Lateral, Right Malleolus - Lateral Proximal Distal Wounding Event: Gradually Appeared Gradually Appeared Gradually Appeared Primary Etiology: Arterial Insufficiency Ulcer Arterial Insufficiency Ulcer Arterial Insufficiency Ulcer Comorbid History: Congestive Heart Failure, Congestive Heart Failure, Congestive Heart Failure, Coronary Artery Disease, Coronary Artery Disease, Coronary Artery Disease, Hypertension, Myocardial Hypertension, Myocardial Hypertension, Myocardial Infarction, Osteoarthritis Infarction, Osteoarthritis Infarction, Osteoarthritis Date Acquired: 04/08/2017 01/15/2017 03/23/2017 Weeks of Treatment: 1 1 1  Cataldo, [name] ([id number]) Wound Status: Open Open Open Clustered Quantity: N/A N/A N/A Measurements L  x W x D 0.3x0.3x0.1 0.4x0.5x0.1 0.4x0.4x0.2 (cm) Area (cm) : 0.071 0.157 0.126 Volume (cm) : 0.007 0.016 0.025 % Reduction in Area: 63.80% 33.50% -306.50% % Reduction in Volume: 88.10% 66.00% -733.30% Classification: Full Thickness Without Full Thickness Without Full Thickness  Without Exposed Support Structures Exposed Support Structures Exposed Support Structures Exudate Amount: Large None Present None Present Exudate Type: Sanguinous N/A N/A Exudate Color: red N/A N/A Wound Margin: Flat and Intact Flat and Intact Flat and Intact Granulation Amount: Large (67-100%) Medium (34-66%) None Present (0%) Granulation Quality: Red Red N/A Necrotic Amount: None Present (0%) Medium (34-66%) Large (67-100%) Necrotic Tissue: N/A Eschar, Adherent Slough Eschar Exposed Structures: Fascia: No Fascia: No Fat Layer (Subcutaneous Fat Layer (Subcutaneous Fat Layer (Subcutaneous Tissue) Exposed: Yes Tissue) Exposed: No Tissue) Exposed: No Fascia: No Tendon: No Tendon: No Tendon: No Muscle: No Muscle: No Muscle: No Joint: No Joint: No Joint: No Bone: No Bone: No Bone: No Epithelialization: None None None Debridement: N/A Debridement - Excisional Debridement - Excisional Pre-procedure N/A 14:15 14:15 Verification/Time Out Taken: Pain Control: N/A Other Other Tissue Debrided: N/A Subcutaneous, Slough Subcutaneous, Slough Level: N/A Skin/Subcutaneous Tissue Skin/Subcutaneous Tissue Debridement Area (sq cm): N/A 0.2 0.16 Instrument: N/A Curette Curette Bleeding: N/A Minimum Minimum Hemostasis Achieved: N/A Pressure Pressure Procedural Pain: N/A 0 0 Post Procedural Pain: N/A 0 0 Debridement Treatment N/A Procedure was tolerated well Procedure was tolerated well Response: Post Debridement N/A 0.4x0.5x0.2 0.4x0.4x0.2 Measurements L x W x D (cm) Post Debridement Volume: N/A 0.031 0.025 (cm) Periwound Skin Texture: No Abnormalities Noted No Abnormalities Noted Excoriation: No Induration: No Callus: No Crepitus: No Rash: No Scarring: No Periwound Skin Moisture: No Abnormalities Noted No Abnormalities Noted Maceration: No Dry/Scaly: No Periwound Skin Color: Erythema: Yes Erythema: Yes Atrophie Blanche: No Cyanosis: No Ecchymosis: No Erythema:  No Hosein, Mayra E. ([id number]) Hemosiderin Staining: No Mottled: No Pallor: No Rubor: No Erythema Location: Circumferential Circumferential N/A Temperature: No Abnormality No Abnormality N/A Tenderness on Palpation: Yes Yes No Wound Preparation: Ulcer Cleansing: Ulcer Cleansing: Ulcer Cleansing: Rinsed/Irrigated with Saline, Rinsed/Irrigated with Saline, Rinsed/Irrigated with Saline Other: soap and water Other: soap and water Topical Anesthetic Applied: Topical Anesthetic Applied: Topical Anesthetic Applied: Other: lidocaine 4% Other: lidocaine 4% Other: lidocaine 4% Procedures Performed: N/A Debridement Debridement Treatment Notes Electronic Signature(s) Signed: 04/15/2017 5:02:04 PM By: Linton Ham MD Entered By: Linton Ham on 04/15/2017 14:22:15 Droessler, [name] ([id number]) -------------------------------------------------------------------------------- Multi-Disciplinary Care Plan Details Patient Name: [name] Date of Service: 04/15/2017 1:30 PM Medical Record Number: [id number] Patient Account Number: [id number] Date of Birth/Sex: 1940-05-31 (76 y.o. M) Treating RN: Cornell Barman Primary Care Murielle Stang: Cyndi Bender Other Clinician: Referring Juliet Vasbinder: Cyndi Bender Treating Zakkery Dorian/Extender: Tito Dine in Treatment: 1 Active Inactive ` Abuse / Safety / Falls / Self Care Management Nursing Diagnoses: History of Falls Goals: Patient will remain injury free related to falls Date Initiated: 04/08/2017 Target Resolution Date: 05/08/2017 Goal Status: Active Interventions: Assess fall risk on admission and as needed Notes: ` Orientation to the Wound Care Program Nursing Diagnoses: Knowledge deficit related to the wound healing center program Goals: Patient/caregiver will verbalize understanding of the [place] Program Date Initiated: 04/08/2017 Target Resolution Date: 05/08/2017 Goal Status:  Active Interventions: Provide education on orientation to the wound center Notes: ` Soft Tissue Infection Nursing Diagnoses: Impaired tissue integrity Potential for infection: soft tissue Goals: Patient will remain free of wound infection Date Initiated: 04/08/2017 Target Resolution Date: 05/08/2017 Goal Status: Active GUALBERTO, WAHLEN ([id number])  Interventions: Assess signs and symptoms of infection every visit Notes: ` Wound/Skin Impairment Nursing Diagnoses: Impaired tissue integrity Goals: Ulcer/skin breakdown will heal within 14 weeks Date Initiated: 04/08/2017 Target Resolution Date: 07/20/2017 Goal Status: Active Interventions: Assess patient/caregiver ability to perform ulcer/skin care regimen upon admission and as needed Provide education on ulcer and skin care Treatment Activities: Topical wound management initiated : 04/08/2017 Notes: Electronic Signature(s) Signed: 04/15/2017 2:55:02 PM By: Gretta Cool, BSN, RN, CWS, Kim RN, BSN Entered By: Gretta Cool, BSN, RN, CWS, Kim on 04/15/2017 14:14:33 Poteet, [name] ([id number]) -------------------------------------------------------------------------------- Pain Assessment Details Patient Name: [name] Date of Service: 04/15/2017 1:30 PM Medical Record Number: [id number] Patient Account Number: [id number] Date of Birth/Sex: May 09, 1940 (76 y.o. M) Treating RN: Montey Hora Primary Care Arianna Haydon: Cyndi Bender Other Clinician: Referring Clotine Heiner: Cyndi Bender Treating Dashel Goines/Extender: Tito Dine in Treatment: 1 Active Problems Location of Pain Severity and Description of Pain Patient Has Paino No Site Locations Pain Management and Medication Current Pain Management: Electronic Signature(s) Signed: 04/15/2017 5:03:40 PM By: Montey Hora Entered By: Montey Hora on 04/15/2017 13:51:54 Forrest, [name]  ([id number]) -------------------------------------------------------------------------------- Patient/Caregiver Education Details Patient Name: [name] Date of Service: 04/15/2017 1:30 PM Medical Record Number: [id number] Patient Account Number: [id number] Date of Birth/Gender: [birth year] (76 y.o. M) Treating RN: Cornell Barman Primary Care Physician: Cyndi Bender Other Clinician: Referring Physician: Cyndi Bender Treating Physician/Extender: Tito Dine in Treatment: 1 Education Assessment Education Provided To: Patient Education Topics Provided Wound Debridement: Handouts: Wound Debridement Methods: Explain/Verbal Responses: State content correctly Wound/Skin Impairment: Handouts: Caring for Your Ulcer Methods: Explain/Verbal Responses: State content correctly Electronic Signature(s) Signed: 04/15/2017 2:55:02 PM By: Gretta Cool, BSN, RN, CWS, Kim RN, BSN Entered By: Gretta Cool, BSN, RN, CWS, Kim on 04/15/2017 14:36:02 Belton, [name] ([id number]) -------------------------------------------------------------------------------- Wound Assessment Details Patient Name: [name] Date of Service: 04/15/2017 1:30 PM Medical Record Number: [id number] Patient Account Number: [id number] Date of Birth/Sex: [birth year] (76 y.o. M) Treating RN: Montey Hora Primary Care Emelie Newsom: Cyndi Bender Other Clinician: Referring Harnoor Kohles: Cyndi Bender Treating Laniqua Torrens/Extender: Tito Dine in Treatment: 1 Wound Status Wound Number: 1 Primary Arterial Insufficiency Ulcer Etiology: Wound Location: Right Lower Leg - Medial, Proximal Wound Open Wounding Event: Gradually Appeared Status: Date Acquired: 01/15/2017 Comorbid Congestive Heart Failure, Coronary Artery Weeks Of Treatment: 1 History: Disease, Hypertension, Myocardial Infarction, Clustered Wound: No Osteoarthritis Photos Photo Uploaded By: Montey Hora on 04/15/2017 15:11:20 Wound  Measurements Length: (cm) 0.2 Width: (cm) 0.2 Depth: (cm) 0.1 Clustered Quantity: 9 Area: (cm) 0.031 Volume: (cm) 0.003 % Reduction in Area: 86.9% % Reduction in Volume: 87.5% Epithelialization: None Tunneling: No Undermining: No Wound Description Full Thickness Without Exposed Support Classification: Structures Wound Margin: Distinct, outline attached Exudate Large Amount: Exudate Type: Sanguinous Exudate Color: red Foul Odor After Cleansing: No Slough/Fibrino Yes Wound Bed Granulation Amount: None Present (0%) Exposed Structure Necrotic Amount: Large (67-100%) Fascia Exposed: No Necrotic Quality: Eschar, Adherent Slough Fat Layer (Subcutaneous Tissue) Exposed: No Tendon Exposed: No Muscle Exposed: No Joint Exposed: No Goranson, Olivier E. ([id number]) Bone Exposed: No Periwound Skin Texture Texture Color No Abnormalities Noted: No No Abnormalities Noted: No Callus: No Atrophie Blanche: No Crepitus: No Cyanosis: No Excoriation: No Ecchymosis: No Induration: No Erythema: No Rash: No Hemosiderin Staining: No Scarring: No Mottled: No Pallor: No Moisture Rubor: No No Abnormalities Noted: No Dry / Scaly: No Temperature / Pain Maceration: No Temperature: No Abnormality Tenderness on Palpation: Yes Wound Preparation Ulcer Cleansing: Rinsed/Irrigated with Saline, Other: soap  and water, Topical Anesthetic Applied: Other: lidocaine 4%, Treatment Notes Wound #1 (Right, Proximal, Medial Lower Leg) 1. Cleansed with: Clean wound with Normal Saline 2. Anesthetic Topical Lidocaine 4% cream to wound bed prior to debridement 4. Dressing Applied: Prisma Ag 5. Secondary Dressing Applied ABD Pad 7. Secured with 3 Layer Compression System - Right Lower Extremity Notes unna boot to anchor Electronic Signature(s) Signed: 04/15/2017 5:03:40 PM By: Montey Hora Entered By: Montey Hora on 04/15/2017 14:00:14 Reining, [name]  ([id number]) -------------------------------------------------------------------------------- Wound Assessment Details Patient Name: [name] Date of Service: 04/15/2017 1:30 PM Medical Record Number: [id number] Patient Account Number: [id number] Date of Birth/Sex: [birth year] (76 y.o. M) Treating RN: Montey Hora Primary Care Murtaza Shell: Cyndi Bender Other Clinician: Referring Camellia Popescu: Cyndi Bender Treating Harlee Eckroth/Extender: Tito Dine in Treatment: 1 Wound Status Wound Number: 2 Primary Arterial Insufficiency Ulcer Etiology: Wound Location: Right Lower Leg - Medial, Distal Wound Open Wounding Event: Gradually Appeared Status: Date Acquired: 01/15/2017 Comorbid Congestive Heart Failure, Coronary Artery Weeks Of Treatment: 1 History: Disease, Hypertension, Myocardial Infarction, Clustered Wound: No Osteoarthritis Photos Photo Uploaded By: Montey Hora on 04/15/2017 15:11:21 Wound Measurements Length: (cm) 0.3 Width: (cm) 0.2 Depth: (cm) 0.1 Clustered Quantity: 5 Area: (cm) 0.047 Volume: (cm) 0.005 % Reduction in Area: 75% % Reduction in Volume: 86.8% Epithelialization: None Tunneling: No Undermining: No Wound Description Full Thickness Without Exposed Support Classification: Structures Wound Margin: Flat and Intact Exudate Large Amount: Exudate Type: Sanguinous Exudate Color: red Foul Odor After Cleansing: No Slough/Fibrino Yes Wound Bed Granulation Amount: Medium (34-66%) Exposed Structure Granulation Quality: Red Fascia Exposed: No Necrotic Amount: Medium (34-66%) Fat Layer (Subcutaneous Tissue) Exposed: No Tendon Exposed: No Muscle Exposed: No Joint Exposed: No Saline, Nash E. ([id number]) Bone Exposed: No Periwound Skin Texture Texture Color No Abnormalities Noted: No No Abnormalities Noted: No Callus: No Atrophie Blanche: No Crepitus: No Cyanosis: No Excoriation: No Ecchymosis: No Induration: No Erythema:  No Rash: No Hemosiderin Staining: No Scarring: No Mottled: No Pallor: No Moisture Rubor: No No Abnormalities Noted: No Dry / Scaly: No Temperature / Pain Maceration: No Temperature: No Abnormality Tenderness on Palpation: Yes Wound Preparation Ulcer Cleansing: Rinsed/Irrigated with Saline, Other: soap and water, Topical Anesthetic Applied: Other: lidocaine 4%, Treatment Notes Wound #2 (Right, Distal, Medial Lower Leg) 1. Cleansed with: Clean wound with Normal Saline 2. Anesthetic Topical Lidocaine 4% cream to wound bed prior to debridement 4. Dressing Applied: Prisma Ag 5. Secondary Dressing Applied ABD Pad 7. Secured with 3 Layer Compression System - Right Lower Extremity Notes unna boot to anchor Electronic Signature(s) Signed: 04/15/2017 5:03:40 PM By: Montey Hora Entered By: Montey Hora on 04/15/2017 14:01:09 Wardlow, [name] ([id number]) -------------------------------------------------------------------------------- Wound Assessment Details Patient Name: [name] Date of Service: 04/15/2017 1:30 PM Medical Record Number: [id number] Patient Account Number: [id number] Date of Birth/Sex: 1940-12-31 (76 y.o. M) Treating RN: Montey Hora Primary Care Elainna Eshleman: Cyndi Bender Other Clinician: Referring Rigdon Macomber: Cyndi Bender Treating Arella Blinder/Extender: Tito Dine in Treatment: 1 Wound Status Wound Number: 3 Primary Arterial Insufficiency Ulcer Etiology: Wound Location: Right Lower Leg - Anterior Wound Open Wounding Event: Gradually Appeared Status: Date Acquired: 01/15/2017 Comorbid Congestive Heart Failure, Coronary Artery Weeks Of Treatment: 1 History: Disease, Hypertension, Myocardial Infarction, Clustered Wound: No Osteoarthritis Photos Photo Uploaded By: Montey Hora on 04/15/2017 15:11:41 Wound Measurements Length: (cm) 0.5 Width: (cm) 0.7 Depth: (cm) 0.2 Area: (cm) 0.275 Volume: (cm) 0.055 % Reduction in  Area: 44.4% % Reduction in Volume: 62.8% Epithelialization: None Tunneling: No  Undermining: No Wound Description Full Thickness Without Exposed Support Classification: Structures Wound Margin: Flat and Intact Exudate Large Amount: Exudate Type: Sanguinous Exudate Color: red Foul Odor After Cleansing: No Slough/Fibrino Yes Wound Bed Granulation Amount: Medium (34-66%) Exposed Structure Granulation Quality: Red Fascia Exposed: No Necrotic Amount: Medium (34-66%) Fat Layer (Subcutaneous Tissue) Exposed: Yes Necrotic Quality: Eschar, Adherent Slough Tendon Exposed: No Muscle Exposed: No Joint Exposed: No Bone Exposed: No Dietrick, Sevin E. ([id number]) Periwound Skin Texture Texture Color No Abnormalities Noted: No No Abnormalities Noted: No Callus: No Atrophie Blanche: No Crepitus: No Cyanosis: No Excoriation: No Ecchymosis: No Induration: No Erythema: No Rash: No Hemosiderin Staining: No Scarring: No Mottled: No Pallor: No Moisture Rubor: No No Abnormalities Noted: No Dry / Scaly: No Temperature / Pain Maceration: No Temperature: No Abnormality Wound Preparation Ulcer Cleansing: Other: soap and water, Topical Anesthetic Applied: Other: lidocaine 4%, Treatment Notes Wound #3 (Right, Anterior Lower Leg) 1. Cleansed with: Clean wound with Normal Saline 2. Anesthetic Topical Lidocaine 4% cream to wound bed prior to debridement 4. Dressing Applied: Prisma Ag 5. Secondary Dressing Applied ABD Pad 7. Secured with 3 Layer Compression System - Right Lower Extremity Notes unna boot to anchor Electronic Signature(s) Signed: 04/15/2017 5:03:40 PM By: Montey Hora Entered By: Montey Hora on 04/15/2017 14:01:57 Hoots, [name] ([id number]) -------------------------------------------------------------------------------- Wound Assessment Details Patient Name: [name] Date of Service: 04/15/2017 1:30 PM Medical Record Number:  [id number] Patient Account Number: [id number] Date of Birth/Sex: [birth year] (76 y.o. M) Treating RN: Montey Hora Primary Care Reata Petrov: Cyndi Bender Other Clinician: Referring Rahul Malinak: Cyndi Bender Treating Lamon Rotundo/Extender: Tito Dine in Treatment: 1 Wound Status Wound Number: 4 Primary Arterial Insufficiency Ulcer Etiology: Wound Location: Right Lower Leg - Lateral, Proximal Wound Open Wounding Event: Gradually Appeared Status: Date Acquired: 04/08/2017 Comorbid Congestive Heart Failure, Coronary Artery Weeks Of Treatment: 1 History: Disease, Hypertension, Myocardial Infarction, Clustered Wound: No Osteoarthritis Photos Photo Uploaded By: Montey Hora on 04/15/2017 15:11:41 Wound Measurements Length: (cm) 0.3 Width: (cm) 0.3 Depth: (cm) 0.1 Area: (cm) 0.071 Volume: (cm) 0.007 % Reduction in Area: 63.8% % Reduction in Volume: 88.1% Epithelialization: None Tunneling: No Undermining: No Wound Description Full Thickness Without Exposed Support Classification: Structures Wound Margin: Flat and Intact Exudate Large Amount: Exudate Type: Sanguinous Exudate Color: red Foul Odor After Cleansing: No Slough/Fibrino No Wound Bed Granulation Amount: Large (67-100%) Exposed Structure Granulation Quality: Red Fascia Exposed: No Necrotic Amount: None Present (0%) Fat Layer (Subcutaneous Tissue) Exposed: No Tendon Exposed: No Muscle Exposed: No Joint Exposed: No Bone Exposed: No Cubit, Bradie E. ([id number]) Periwound Skin Texture Texture Color No Abnormalities Noted: No No Abnormalities Noted: No Erythema: Yes Moisture Erythema Location: Circumferential No Abnormalities Noted: No Temperature / Pain Temperature: No Abnormality Tenderness on Palpation: Yes Wound Preparation Ulcer Cleansing: Rinsed/Irrigated with Saline, Other: soap and water, Topical Anesthetic Applied: Other: lidocaine 4%, Treatment Notes Wound #4 (Right, Proximal,  Lateral Lower Leg) 1. Cleansed with: Clean wound with Normal Saline 2. Anesthetic Topical Lidocaine 4% cream to wound bed prior to debridement 4. Dressing Applied: Prisma Ag 5. Secondary Dressing Applied ABD Pad 7. Secured with 3 Layer Compression System - Right Lower Extremity Notes unna boot to anchor Electronic Signature(s) Signed: 04/15/2017 5:03:40 PM By: Montey Hora Entered By: Montey Hora on 04/15/2017 14:02:16 Jardin, [name] ([id number]) -------------------------------------------------------------------------------- Wound Assessment Details Patient Name: [name] Date of Service: 04/15/2017 1:30 PM Medical Record Number: [id number] Patient Account Number: [id number] Date of Birth/Sex: 08/27/1940 (76 y.o. M) Treating RN:  Montey Hora Primary Care Luisana Lutzke: Cyndi Bender Other Clinician: Referring Ladashia Demarinis: Cyndi Bender Treating Nonna Renninger/Extender: Tito Dine in Treatment: 1 Wound Status Wound Number: 5 Primary Arterial Insufficiency Ulcer Etiology: Wound Location: Right Lower Leg - Lateral, Distal Wound Open Wounding Event: Gradually Appeared Status: Date Acquired: 01/15/2017 Comorbid Congestive Heart Failure, Coronary Artery Weeks Of Treatment: 1 History: Disease, Hypertension, Myocardial Infarction, Clustered Wound: No Osteoarthritis Photos Photo Uploaded By: Montey Hora on 04/15/2017 15:12:01 Wound Measurements Length: (cm) 0.4 Width: (cm) 0.5 Depth: (cm) 0.1 Area: (cm) 0.157 Volume: (cm) 0.016 % Reduction in Area: 33.5% % Reduction in Volume: 66% Epithelialization: None Tunneling: No Undermining: No Wound Description Full Thickness Without Exposed Support Classification: Structures Wound Margin: Flat and Intact Exudate None Present Amount: Foul Odor After Cleansing: No Slough/Fibrino Yes Wound Bed Granulation Amount: Medium (34-66%) Exposed Structure Granulation Quality: Red Fascia Exposed:  No Necrotic Amount: Medium (34-66%) Fat Layer (Subcutaneous Tissue) Exposed: No Necrotic Quality: Eschar, Adherent Slough Tendon Exposed: No Muscle Exposed: No Joint Exposed: No Bone Exposed: No Periwound Skin Texture Weimer, Parnell E. ([id number]) Texture Color No Abnormalities Noted: No No Abnormalities Noted: No Erythema: Yes Moisture Erythema Location: Circumferential No Abnormalities Noted: No Temperature / Pain Temperature: No Abnormality Tenderness on Palpation: Yes Wound Preparation Ulcer Cleansing: Rinsed/Irrigated with Saline, Other: soap and water, Topical Anesthetic Applied: Other: lidocaine 4%, Treatment Notes Wound #5 (Right, Distal, Lateral Lower Leg) 1. Cleansed with: Clean wound with Normal Saline 2. Anesthetic Topical Lidocaine 4% cream to wound bed prior to debridement 4. Dressing Applied: Prisma Ag 5. Secondary Dressing Applied ABD Pad 7. Secured with 3 Layer Compression System - Right Lower Extremity Notes unna boot to anchor Electronic Signature(s) Signed: 04/15/2017 5:03:40 PM By: Montey Hora Entered By: Montey Hora on 04/15/2017 14:03:15 Lasser, [name] ([id number]) -------------------------------------------------------------------------------- Wound Assessment Details Patient Name: [name] Date of Service: 04/15/2017 1:30 PM Medical Record Number: [id number] Patient Account Number: [id number] Date of Birth/Sex: 29-Jun-1940 (76 y.o. M) Treating RN: Montey Hora Primary Care Harjit Douds: Cyndi Bender Other Clinician: Referring Charls Custer: Cyndi Bender Treating Zion Lint/Extender: Tito Dine in Treatment: 1 Wound Status Wound Number: 6 Primary Arterial Insufficiency Ulcer Etiology: Wound Location: Right Malleolus - Lateral Wound Open Wounding Event: Gradually Appeared Status: Date Acquired: 03/23/2017 Comorbid Congestive Heart Failure, Coronary Artery Weeks Of Treatment: 1 History: Disease, Hypertension,  Myocardial Infarction, Clustered Wound: No Osteoarthritis Photos Photo Uploaded By: Montey Hora on 04/15/2017 15:12:02 Wound Measurements Length: (cm) 0.4 Width: (cm) 0.4 Depth: (cm) 0.2 Area: (cm) 0.126 Volume: (cm) 0.025 % Reduction in Area: -306.5% % Reduction in Volume: -733.3% Epithelialization: None Tunneling: No Undermining: No Wound Description Full Thickness Without Exposed Support Classification: Structures Wound Margin: Flat and Intact Exudate None Present Amount: Foul Odor After Cleansing: No Slough/Fibrino Yes Wound Bed Granulation Amount: None Present (0%) Exposed Structure Necrotic Amount: Large (67-100%) Fascia Exposed: No Necrotic Quality: Eschar Fat Layer (Subcutaneous Tissue) Exposed: Yes Tendon Exposed: No Muscle Exposed: No Joint Exposed: No Bone Exposed: No Periwound Skin Texture Ehly, Joaopedro E. ([id number]) Texture Color No Abnormalities Noted: No No Abnormalities Noted: No Callus: No Atrophie Blanche: No Crepitus: No Cyanosis: No Excoriation: No Ecchymosis: No Induration: No Erythema: No Rash: No Hemosiderin Staining: No Scarring: No Mottled: No Pallor: No Moisture Rubor: No No Abnormalities Noted: No Dry / Scaly: No Maceration: No Wound Preparation Ulcer Cleansing: Rinsed/Irrigated with Saline Topical Anesthetic Applied: Other: lidocaine 4%, Treatment Notes Wound #6 (Right, Lateral Malleolus) 1. Cleansed with: Clean wound with Normal Saline 2. Anesthetic  Topical Lidocaine 4% cream to wound bed prior to debridement 4. Dressing Applied: Prisma Ag 5. Secondary Dressing Applied ABD Pad 7. Secured with 3 Layer Compression System - Right Lower Extremity Notes unna boot to anchor Electronic Signature(s) Signed: 04/15/2017 5:03:40 PM By: Montey Hora Entered By: Montey Hora on 04/15/2017 14:03:38 Bradwell, [name]  ([id number]) -------------------------------------------------------------------------------- Vitals Details Patient Name: [name] Date of Service: 04/15/2017 1:30 PM Medical Record Number: [id number] Patient Account Number: [id number] Date of Birth/Sex: January 19, 1940 (76 y.o. M) Treating RN: Montey Hora Primary Care Blayn Whetsell: Cyndi Bender Other Clinician: Referring Relda Agosto: Cyndi Bender Treating Sumner Boesch/Extender: Tito Dine in Treatment: 1 Vital Signs Time Taken: 13:51 Temperature (F): 97.6 Height (in): 68 Pulse (bpm): 68 Respiratory Rate (breaths/min): 20 Blood Pressure (mmHg): 101/63 Reference Range: 80 - 120 mg / dl Electronic Signature(s) Signed: 04/15/2017 5:03:40 PM By: Montey Hora Entered By: Montey Hora on 04/15/2017 13:53:20

## 2017-04-20 DIAGNOSIS — I110 Hypertensive heart disease with heart failure: Secondary | ICD-10-CM | POA: Diagnosis not present

## 2017-04-20 DIAGNOSIS — M6281 Muscle weakness (generalized): Secondary | ICD-10-CM | POA: Diagnosis not present

## 2017-04-20 DIAGNOSIS — M069 Rheumatoid arthritis, unspecified: Secondary | ICD-10-CM | POA: Diagnosis not present

## 2017-04-20 DIAGNOSIS — I2510 Atherosclerotic heart disease of native coronary artery without angina pectoris: Secondary | ICD-10-CM | POA: Diagnosis not present

## 2017-04-20 DIAGNOSIS — I255 Ischemic cardiomyopathy: Secondary | ICD-10-CM | POA: Diagnosis not present

## 2017-04-20 DIAGNOSIS — I5023 Acute on chronic systolic (congestive) heart failure: Secondary | ICD-10-CM | POA: Diagnosis not present

## 2017-04-22 ENCOUNTER — Encounter: Payer: Medicare Other | Admitting: Internal Medicine

## 2017-04-22 DIAGNOSIS — L97212 Non-pressure chronic ulcer of right calf with fat layer exposed: Secondary | ICD-10-CM | POA: Diagnosis not present

## 2017-04-22 DIAGNOSIS — L97312 Non-pressure chronic ulcer of right ankle with fat layer exposed: Secondary | ICD-10-CM | POA: Diagnosis not present

## 2017-04-22 DIAGNOSIS — L97211 Non-pressure chronic ulcer of right calf limited to breakdown of skin: Secondary | ICD-10-CM | POA: Diagnosis not present

## 2017-04-22 DIAGNOSIS — I255 Ischemic cardiomyopathy: Secondary | ICD-10-CM | POA: Diagnosis not present

## 2017-04-22 DIAGNOSIS — L97521 Non-pressure chronic ulcer of other part of left foot limited to breakdown of skin: Secondary | ICD-10-CM | POA: Diagnosis not present

## 2017-04-22 DIAGNOSIS — I70233 Atherosclerosis of native arteries of right leg with ulceration of ankle: Secondary | ICD-10-CM | POA: Diagnosis not present

## 2017-04-22 DIAGNOSIS — L97519 Non-pressure chronic ulcer of other part of right foot with unspecified severity: Secondary | ICD-10-CM | POA: Diagnosis not present

## 2017-04-22 DIAGNOSIS — L97511 Non-pressure chronic ulcer of other part of right foot limited to breakdown of skin: Secondary | ICD-10-CM | POA: Diagnosis not present

## 2017-04-22 DIAGNOSIS — L97219 Non-pressure chronic ulcer of right calf with unspecified severity: Secondary | ICD-10-CM | POA: Diagnosis not present

## 2017-04-22 DIAGNOSIS — I70232 Atherosclerosis of native arteries of right leg with ulceration of calf: Secondary | ICD-10-CM | POA: Diagnosis not present

## 2017-04-23 DIAGNOSIS — M6281 Muscle weakness (generalized): Secondary | ICD-10-CM | POA: Diagnosis not present

## 2017-04-23 DIAGNOSIS — M069 Rheumatoid arthritis, unspecified: Secondary | ICD-10-CM | POA: Diagnosis not present

## 2017-04-23 DIAGNOSIS — I2510 Atherosclerotic heart disease of native coronary artery without angina pectoris: Secondary | ICD-10-CM | POA: Diagnosis not present

## 2017-04-23 DIAGNOSIS — I5023 Acute on chronic systolic (congestive) heart failure: Secondary | ICD-10-CM | POA: Diagnosis not present

## 2017-04-23 DIAGNOSIS — I255 Ischemic cardiomyopathy: Secondary | ICD-10-CM | POA: Diagnosis not present

## 2017-04-23 DIAGNOSIS — I110 Hypertensive heart disease with heart failure: Secondary | ICD-10-CM | POA: Diagnosis not present

## 2017-04-24 DIAGNOSIS — I2510 Atherosclerotic heart disease of native coronary artery without angina pectoris: Secondary | ICD-10-CM | POA: Diagnosis not present

## 2017-04-24 DIAGNOSIS — M069 Rheumatoid arthritis, unspecified: Secondary | ICD-10-CM | POA: Diagnosis not present

## 2017-04-24 DIAGNOSIS — I255 Ischemic cardiomyopathy: Secondary | ICD-10-CM | POA: Diagnosis not present

## 2017-04-24 DIAGNOSIS — I5023 Acute on chronic systolic (congestive) heart failure: Secondary | ICD-10-CM | POA: Diagnosis not present

## 2017-04-24 DIAGNOSIS — I110 Hypertensive heart disease with heart failure: Secondary | ICD-10-CM | POA: Diagnosis not present

## 2017-04-24 DIAGNOSIS — M6281 Muscle weakness (generalized): Secondary | ICD-10-CM | POA: Diagnosis not present

## 2017-04-27 DIAGNOSIS — I2510 Atherosclerotic heart disease of native coronary artery without angina pectoris: Secondary | ICD-10-CM | POA: Diagnosis not present

## 2017-04-27 DIAGNOSIS — I255 Ischemic cardiomyopathy: Secondary | ICD-10-CM | POA: Diagnosis not present

## 2017-04-27 DIAGNOSIS — M069 Rheumatoid arthritis, unspecified: Secondary | ICD-10-CM | POA: Diagnosis not present

## 2017-04-27 DIAGNOSIS — M6281 Muscle weakness (generalized): Secondary | ICD-10-CM | POA: Diagnosis not present

## 2017-04-27 DIAGNOSIS — I110 Hypertensive heart disease with heart failure: Secondary | ICD-10-CM | POA: Diagnosis not present

## 2017-04-27 DIAGNOSIS — I5023 Acute on chronic systolic (congestive) heart failure: Secondary | ICD-10-CM | POA: Diagnosis not present

## 2017-04-27 NOTE — Progress Notes (Signed)
CARA, AGUINO ([id number]) Visit Report for 04/22/2017 Debridement Details Patient Name: [name], MONDRY Date of Service: 04/22/2017 3:15 PM Medical Record Number: [id number] Patient Account Number: [id number] Date of Birth/Sex: Oct 23, 1940 (76 y.o. M) Treating RN: Cornell Barman Primary Care Provider: Cyndi Bender Other Clinician: Referring Provider: Cyndi Bender Treating Provider/Extender: Tito Dine in Treatment: 2 Debridement Performed for Wound #4 Right,Proximal,Lateral Lower Leg Assessment: Performed By: Physician Ricard Dillon, MD Debridement Type: Debridement Severity of Tissue Pre Fat layer exposed Debridement: Pre-procedure Verification/Time Yes - 16:00 Out Taken: Start Time: 16:01 Pain Control: Other : lidocaine 4% Total Area Debrided (L x W): 0.4 (cm) x 0.4 (cm) = 0.16 (cm) Tissue and other material Viable, Non-Viable, Eschar, Slough, Subcutaneous, Slough debrided: Level: Skin/Subcutaneous Tissue Debridement Description: Excisional Instrument: Curette Bleeding: Minimum Hemostasis Achieved: Pressure End Time: 16:05 Procedural Pain: 2 Post Procedural Pain: 2 Response to Treatment: Procedure was tolerated well Post Debridement Measurements of Total Wound Length: (cm) 0.4 Width: (cm) 0.4 Depth: (cm) 0.1 Volume: (cm) 0.013 Character of Wound/Ulcer Post Debridement: Stable Severity of Tissue Post Debridement: Fat layer exposed Post Procedure Diagnosis Same as Pre-procedure Electronic Signature(s) Signed: 04/22/2017 5:09:03 PM By: Linton Ham MD Signed: 04/22/2017 5:11:52 PM By: Gretta Cool, BSN, RN, CWS, Kim RN, BSN Entered By: Linton Ham on 04/22/2017 16:47:37 Fujikawa, [name] ([id number]) -------------------------------------------------------------------------------- Debridement Details Patient Name: [name] Date of Service: 04/22/2017 3:15 PM Medical Record Number: [id number] Patient Account Number: [id number] Date  of Birth/Sex: 04/19/1940 (76 y.o. M) Treating RN: Cornell Barman Primary Care Provider: Cyndi Bender Other Clinician: Referring Provider: Cyndi Bender Treating Provider/Extender: Tito Dine in Treatment: 2 Debridement Performed for Wound #6 Right,Lateral Malleolus Assessment: Performed By: Physician Ricard Dillon, MD Debridement Type: Debridement Severity of Tissue Pre Fat layer exposed Debridement: Pre-procedure Verification/Time Yes - 16:00 Out Taken: Start Time: 16:01 Pain Control: Other : lidocaine 4% Total Area Debrided (L x W): 0.5 (cm) x 0.5 (cm) = 0.25 (cm) Tissue and other material Viable, Non-Viable, Eschar, Slough, Subcutaneous, Slough debrided: Level: Skin/Subcutaneous Tissue Debridement Description: Excisional Instrument: Curette Bleeding: Minimum Hemostasis Achieved: Pressure End Time: 16:05 Procedural Pain: 2 Post Procedural Pain: 2 Response to Treatment: Procedure was tolerated well Post Debridement Measurements of Total Wound Length: (cm) 0.5 Width: (cm) 0.5 Depth: (cm) 0.3 Volume: (cm) 0.059 Character of Wound/Ulcer Post Debridement: Stable Severity of Tissue Post Debridement: Fat layer exposed Post Procedure Diagnosis Same as Pre-procedure Electronic Signature(s) Signed: 04/22/2017 5:09:03 PM By: Linton Ham MD Signed: 04/22/2017 5:11:52 PM By: Gretta Cool, BSN, RN, CWS, Kim RN, BSN Entered By: Linton Ham on 04/22/2017 16:47:47 [place], [name] ([id number]) -------------------------------------------------------------------------------- HPI Details Patient Name: [name] Date of Service: 04/22/2017 3:15 PM Medical Record Number: [id number] Patient Account Number: [id number] Date of Birth/Sex: 1940-10-01 (76 y.o. M) Treating RN: Cornell Barman Primary Care Provider: Cyndi Bender Other Clinician: Referring Provider: Cyndi Bender Treating Provider/Extender: Tito Dine in Treatment: 2 History of  Present Illness HPI Description: 04/08/17; this is a complex 77 year old man referred here from [facility]. He had been referred there for bilateral lower extremity edema with ulcer formation predominantly on the right calf but also the right [name]. He had been receiving Unna boots bilaterally. The history here is long. He is not a diabetic however ICU looking through late 2018 he was worked up for chronic headaches, elevated inflammatory markers including C-reactive protein and ESR. He went on to actually have a left temporal artery biopsy wasn't that was  negative.he received about 6 weeks of high-dose prednisone 60 mg with improvement in his inflammatory markers. He was admitted to hospital in late November with ventricular tachycardia syncope. He has known ischemic cardiomyopathy. He was admitted in the hospital in mid December. Apparently this was precipitated by a syncopal spell falling out of his scooter while at [place]. There was ventricular arrhythmia. He has an implantable defibrillator and echocardiogram showed severe LV dysfunction with an EF of 20% and valvular regurgitations including mild AR, moderate MR. There was no stenosis. He ruled in for a non-ST elevation MI in the setting of V. tach.his wife states that sometime during this hospitalization she noted multiple areas of skin change on the right lower calf which became evident just after he left the hospital. He was back in hospital in February with acute renal failure hyponatremia. This responded to fluid resuscitation.. Interestingly I can't see much description of his right leg at that point in time.he was followed by Dr. Nehemiah Massed of dermatology for the necrotic wounds on his right leg. Apparently a biopsy was planned at one point but not done although in some notes that suggests it was. I cannot see these results area He was noted to have a lot of edema. Was treated with bilateral Unna boots edges really helped  with the swelling they have been using Bactroban to small open areas predominantly on the right anterior lower leg His history is complicated by the fact that he has rheumatoid arthritis followed by rheumatology. He is followed by neurology for disabling headaches. At one point this was felt to be giant cell arteritis although a left temporal biopsy was apparently negative. He was given a prolonged course of prednisone at 60 mg which managed his sedimentation rates but apparently did not prove improve the headaches. This is been tapered to off on by rheumatology on 03/13/17 Vascular had plans to do a venous reflux workup as well as arterial studies in May. They also wanted to get him a lymphedema pump. As mentioned he's been using bacitracin under Unna boot wraps to both lower legs 04/15/17; the patient arrives with most of his wounds improved. These are small punched out wounds. Most of them remaining ones are on the right anterior calf with the most problematic over the right lateral malleolus. There are no new areas. The symptom complex or potential symptom complex we are dealing with his chronic disabling headaches with inflammatory markers not responsive to prednisone and with a negative temporal biopsy, lower extremity weakness, skin ulcerations just on the right leg. We have managed to get his arterial studies moved up to April 30. He has a rheumatology consult at [facility] in June. He has seen dermatology locally, rheumatology locally, neurology locally. 04/22/17; small punched out areas on the right leg anteriorly posteriorly. Most of these appear to have closed over. Some of them have eschar over the surface. The most problematic area appears to be over the right lateral malleolus. We've been using prisma to all of this. He has arterial studies on April 30 and a rheumatology consult at [facility] on June 28 Electronic Signature(s) Signed: 04/22/2017 5:09:03 PM By: Linton Ham MD Entered By: Linton Ham on 04/22/2017 16:52:55 Favero, [name] ([id number]) -------------------------------------------------------------------------------- Physical Exam Details Patient Name: [name] Date of Service: 04/22/2017 3:15 PM Medical Record Number: [id number] Patient Account Number: [id number] Date of Birth/Sex: Feb 06, 1940 (76 y.o. M) Treating RN: Cornell Barman Primary Care Provider: Cyndi Bender Other Clinician: Referring Provider: Cyndi Bender Treating Provider/Extender:  Deasiah Hagberg G Weeks in Treatment: 2 Constitutional Patient is hypotensive.but appears well. Pulse regular and within target range for patient.[name] Respirations regular, non-labored and within target range.. Temperature is normal and within the target range for the patient.[name] appears in no distress. Cardiovascular Pedal pulses absent on the right. Notes on exam; multiple areas on the right anterior and right posterior calf. Most problematic continues to be on the right lateral malleolus. The area on the right lateral [name] remains closed. Most of the areas on the right posterior calf were already closed when he came here. Some of these areas have surface eschar which I removed on the right anterior calf and right lateral malleolus. Electronic Signature(s) Signed: 04/22/2017 5:09:03 PM By: Linton Ham MD Entered By: Linton Ham on 04/22/2017 16:50:39 Dory, [name] ([id number]) -------------------------------------------------------------------------------- Physician Orders Details Patient Name: [name] Date of Service: 04/22/2017 3:15 PM Medical Record Number: [id number] Patient Account Number: [id number] Date of Birth/Sex: [birth year] (76 y.o. M) Treating RN: Cornell Barman Primary Care Provider: Cyndi Bender Other Clinician: Referring Provider: Cyndi Bender Treating Provider/Extender: Tito Dine in Treatment: 2 Verbal / Phone Orders: No Diagnosis Coding Wound  Cleansing Wound #4 Right,Proximal,Lateral Lower Leg o Clean wound with Normal Saline. Wound #5 Right,Distal,Lateral Lower Leg o Clean wound with Normal Saline. Wound #6 Right,Lateral Malleolus o Clean wound with Normal Saline. Anesthetic (add to Medication List) Wound #4 Right,Proximal,Lateral Lower Leg o Topical Lidocaine 4% cream applied to wound bed prior to debridement (In Clinic Only). Wound #5 Right,Distal,Lateral Lower Leg o Topical Lidocaine 4% cream applied to wound bed prior to debridement (In Clinic Only). Wound #6 Right,Lateral Malleolus o Topical Lidocaine 4% cream applied to wound bed prior to debridement (In Clinic Only). Primary Wound Dressing Wound #4 Right,Proximal,Lateral Lower Leg o Silver Collagen - all wounds Wound #5 Right,Distal,Lateral Lower Leg o Silver Collagen - all wounds Wound #6 Right,Lateral Malleolus o Silver Collagen - all wounds Secondary Dressing Wound #4 Right,Proximal,Lateral Lower Leg o ABD pad - over collegen, Unna paste to Anchor at top Wound #5 Right,Distal,Lateral Lower Leg o ABD pad - over collegen, Unna paste to Anchor at top Wound #6 Right,Lateral Malleolus o ABD pad - over collegen, Unna paste to Anchor at top Dressing Change Frequency Wound #4 Right,Proximal,Lateral Lower Leg o Change Dressing Monday, Wednesday, Friday AAKASH, HOLLOMON ([id number]) Wound #5 Right,Distal,Lateral Lower Leg o Change Dressing Monday, Wednesday, Friday Wound #6 Right,Lateral Malleolus o Change Dressing Monday, Wednesday, Friday Follow-up Appointments Wound #4 Right,Proximal,Lateral Lower Leg o Return Appointment in 1 week. Wound #5 Right,Distal,Lateral Lower Leg o Return Appointment in 1 week. Wound #6 Right,Lateral Malleolus o Return Appointment in 1 week. Edema Control Wound #4 Right,Proximal,Lateral Lower Leg o 3 Layer Compression System - Right Lower Extremity Wound #5 Right,Distal,Lateral Lower  Leg o 3 Layer Compression System - Right Lower Extremity Wound #6 Right,Lateral Malleolus o 3 Layer Compression System - Right Lower Extremity Home Health Wound #4 Right,Proximal,Lateral Lower Leg o Continue Home Health Visits o Home Health Nurse may visit PRN to address patientos wound care needs. o FACE TO FACE ENCOUNTER: MEDICARE and MEDICAID PATIENTS: I certify that this patient is under my care and that I had a face-to-face encounter that meets the physician face-to-face encounter requirements with this patient on this date. The encounter with the patient was in whole or in part for the following MEDICAL CONDITION: (primary reason for [place]) MEDICAL NECESSITY: I certify, that based on my findings, NURSING services are a medically  necessary home health service. HOME BOUND STATUS: I certify that my clinical findings support that this patient is homebound (i.e., Due to illness or injury, pt requires aid of supportive devices such as crutches, cane, wheelchairs, walkers, the use of special transportation or the assistance of another person to leave their place of residence. There is a normal inability to leave the home and doing so requires considerable and taxing effort. Other absences are for medical reasons / religious services and are infrequent or of short duration when for other reasons). o If current dressing causes regression in wound condition, may D/C ordered dressing product/s and apply Normal Saline Moist Dressing daily until next [place] / Other MD appointment. [place] of regression in wound condition at [phone]. o Please direct any NON-WOUND related issues/requests for orders to patient's Primary Care Physician Wound #5 Right,Distal,Lateral Lower Leg o [place] Nurse may visit PRN to address patientos wound care needs. o FACE TO FACE ENCOUNTER: MEDICARE and MEDICAID PATIENTS: I  certify that this patient is under my care and that I had a face-to-face encounter that meets the physician face-to-face encounter requirements with this patient on this date. The encounter with the patient was in whole or in part for the following MEDICAL CONDITION: (primary reason for [place]) MEDICAL NECESSITY: I certify, that based on my findings, NURSING services are a medically necessary home health service. HOME BOUND STATUS: I certify that my clinical findings support that this patient is homebound (i.e., Due to illness or injury, pt requires aid of supportive devices such as crutches, cane, wheelchairs, walkers, the use of special transportation or the assistance of another person to leave their place of residence. There is a normal inability to leave the home Primm, Miley E. ([id number]) and doing so requires considerable and taxing effort. Other absences are for medical reasons / religious services and are infrequent or of short duration when for other reasons). o If current dressing causes regression in wound condition, may D/C ordered dressing product/s and apply Normal Saline Moist Dressing daily until next [place] / Other MD appointment. [place] of regression in wound condition at [phone]. o Please direct any NON-WOUND related issues/requests for orders to patient's Primary Care Physician Wound #6 [place] Nurse may visit PRN to address patientos wound care needs. o FACE TO FACE ENCOUNTER: MEDICARE and MEDICAID PATIENTS: I certify that this patient is under my care and that I had a face-to-face encounter that meets the physician face-to-face encounter requirements with this patient on this date. The encounter with the patient was in whole or in part for the following MEDICAL CONDITION: (primary reason for [place]) MEDICAL NECESSITY: I certify, that based on  my findings, NURSING services are a medically necessary home health service. HOME BOUND STATUS: I certify that my clinical findings support that this patient is homebound (i.e., Due to illness or injury, pt requires aid of supportive devices such as crutches, cane, wheelchairs, walkers, the use of special transportation or the assistance of another person to leave their place of residence. There is a normal inability to leave the home and doing so requires considerable and taxing effort. Other absences are for medical reasons / religious services and are infrequent or of short duration when for other reasons). o If current dressing causes regression in wound condition, may D/C ordered dressing product/s and apply Normal Saline Moist Dressing  daily until next [place] / Other MD appointment. [place] of regression in wound condition at [phone]. o Please direct any NON-WOUND related issues/requests for orders to patient's Primary Care Physician Electronic Signature(s) Signed: 04/22/2017 5:09:03 PM By: Linton Ham MD Signed: 04/22/2017 5:11:52 PM By: Gretta Cool, BSN, RN, CWS, Kim RN, BSN Entered By: Gretta Cool, BSN, RN, CWS, Kim on 04/22/2017 16:15:08 [place], [name] ([id number]) -------------------------------------------------------------------------------- Problem List Details Patient Name: [name] Date of Service: 04/22/2017 3:15 PM Medical Record Number: [id number] Patient Account Number: [id number] Date of Birth/Sex: Feb 26, 1940 (76 y.o. 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 04/08/2017 Yes breakdown of skin L97.211 Non-pressure  chronic ulcer of right calf limited to breakdown 04/08/2017 Yes of skin L97.511 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 limited to 04/08/2017 Yes breakdown of skin I25.5 Ischemic cardiomyopathy 04/08/2017 Yes Inactive Problems Resolved Problems Electronic Signature(s) Signed: 04/22/2017 5:09:03 PM By: Linton Ham MD Entered By: Linton Ham on 04/22/2017 16:45:22 [place], [name] ([id number]) -------------------------------------------------------------------------------- Progress Note Details Patient Name: [name] Date of Service: 04/22/2017 3:15 PM Medical Record Number: [id number] Patient Account Number: [id number] Date of Birth/Sex: 12-01-1940 (76 y.o. M) Treating RN: Cornell Barman Primary Care Provider: Cyndi Bender Other Clinician: Referring Provider: Cyndi Bender Treating Provider/Extender: Tito Dine in Treatment: 2 Subjective History of Present Illness (HPI) 04/08/17; this is a complex 77 year old man referred here from [facility]. He had been referred there for bilateral lower extremity edema with ulcer formation predominantly on the right calf but also the right [name]. He had been receiving Unna boots bilaterally. The history here is long. He is not a diabetic however ICU looking through late 2018 he was worked up for chronic headaches, elevated inflammatory markers including C-reactive protein and ESR. He went on to actually have a left temporal artery biopsy wasn't that was negative.he received about 6 weeks of high-dose prednisone 60 mg with improvement in his inflammatory markers. He was admitted to hospital in late November with ventricular tachycardia syncope. He has known ischemic cardiomyopathy. He was admitted in the hospital in mid December. Apparently this was precipitated by a syncopal spell falling out of his scooter while at [place]. There was ventricular arrhythmia. He has an implantable defibrillator and  echocardiogram showed severe LV dysfunction with an EF of 20% and valvular regurgitations including mild AR, moderate MR. There was no stenosis. He ruled in for a non-ST elevation MI in the setting of V. tach.his wife states that sometime during this hospitalization she noted multiple areas of skin change on the right lower calf which became evident just after he left the hospital. He was back in hospital in February with acute renal failure hyponatremia. This responded to fluid resuscitation.. Interestingly I can't see much description of his right leg at that point in time.he was followed by Dr. Nehemiah Massed of dermatology for the necrotic wounds on his right leg. Apparently a biopsy was planned at one point but not done although in some notes that suggests it was. I cannot see these results area He was noted to have a lot of edema. Was treated with bilateral Unna boots edges really helped with the swelling they have been using Bactroban to small open areas  predominantly on the right anterior lower leg His history is complicated by the fact that he has rheumatoid arthritis followed by rheumatology. He is followed by neurology for disabling headaches. At one point this was felt to be giant cell arteritis although a left temporal biopsy was apparently negative. He was given a prolonged course of prednisone at 60 mg which managed his sedimentation rates but apparently did not prove improve the headaches. This is been tapered to off on by rheumatology on 03/13/17 Vascular had plans to do a venous reflux workup as well as arterial studies in May. They also wanted to get him a lymphedema pump. As mentioned he's been using bacitracin under Unna boot wraps to both lower legs 04/15/17; the patient arrives with most of his wounds improved. These are small punched out wounds. Most of them remaining ones are on the right anterior calf with the most problematic over the right lateral malleolus. There are no new  areas. The symptom complex or potential symptom complex we are dealing with his chronic disabling headaches with inflammatory markers not responsive to prednisone and with a negative temporal biopsy, lower extremity weakness, skin ulcerations just on the right leg. We have managed to get his arterial studies moved up to April 30. He has a rheumatology consult at [facility] in June. He has seen dermatology locally, rheumatology locally, neurology locally. 04/22/17; small punched out areas on the right leg anteriorly posteriorly. Most of these appear to have closed over. Some of them have eschar over the surface. The most problematic area appears to be over the right lateral malleolus. We've been using prisma to all of this. He has arterial studies on April 30 and a rheumatology consult at [facility] on June 28 RAYHAN, GROLEAU ([id number]) Objective Constitutional Patient is hypotensive.but appears well. Pulse regular and within target range for patient.[name] Respirations regular, non-labored and within target range.. Temperature is normal and within the target range for the patient.[name] appears in no distress. Vitals Time Taken: 3:43 PM, Height: 68 in, Temperature: 97.8 F, Pulse: 68 bpm, Respiratory Rate: 18 breaths/min, Blood Pressure: 92/58 mmHg. Cardiovascular Pedal pulses absent on the right. General Notes: on exam; multiple areas on the right anterior and right posterior calf. Most problematic continues to be on the right lateral malleolus. The area on the right lateral [name] remains closed. Most of the areas on the right posterior calf were already closed when he came here. Some of these areas have surface eschar which I removed on the right anterior calf and right lateral malleolus. Integumentary (Hair, Skin) Wound #1 status is Healed - Epithelialized. Original cause of wound was Gradually Appeared. The wound is located on the Right,Proximal,Medial Lower Leg. The wound measures 0cm length x 0cm width x  0cm depth; 0cm^2 area and 0cm^3 volume. There is no tunneling or undermining noted. There is a medium amount of serous drainage noted. The wound margin is distinct with the outline attached to the wound base. There is no granulation within the wound bed. There is a large (67- 100%) amount of necrotic tissue within the wound bed including Eschar and Adherent Slough. The periwound skin appearance did not exhibit: Callus, Crepitus, Excoriation, Induration, Rash, Scarring, Dry/Scaly, Maceration, Atrophie Blanche, Cyanosis, Ecchymosis, Hemosiderin Staining, Mottled, Pallor, Rubor, Erythema. Periwound temperature was noted as No Abnormality. The periwound has tenderness on palpation. Wound #2 status is Healed - Epithelialized. Original cause of wound was Gradually Appeared. The wound is located on the Right,Distal,Medial Lower Leg. The wound measures 0cm length  x 0cm width x 0cm depth; 0cm^2 area and 0cm^3 volume. There is no tunneling or undermining noted. There is a medium amount of serous drainage noted. The wound margin is flat and intact. There is no granulation within the wound bed. There is a large (67-100%) amount of necrotic tissue within the wound bed including Eschar. The periwound skin appearance did not exhibit: Callus, Crepitus, Excoriation, Induration, Rash, Scarring, Dry/Scaly, Maceration, Atrophie Blanche, Cyanosis, Ecchymosis, Hemosiderin Staining, Mottled, Pallor, Rubor, Erythema. Periwound temperature was noted as No Abnormality. The periwound has tenderness on palpation. Wound #3 status is Healed - Epithelialized. Original cause of wound was Gradually Appeared. The wound is located on the Right,Anterior Lower Leg. The wound measures 0cm length x 0cm width x 0cm depth; 0cm^2 area and 0cm^3 volume. There is Fat Layer (Subcutaneous Tissue) Exposed exposed. There is no tunneling or undermining noted. There is a large amount of sanguinous drainage noted. The wound margin is flat and  intact. There is no granulation within the wound bed. There is a large (67-100%) amount of necrotic tissue within the wound bed including Eschar and Adherent Slough. The periwound skin appearance did not exhibit: Callus, Crepitus, Excoriation, Induration, Rash, Scarring, Dry/Scaly, Maceration, Atrophie Blanche, Cyanosis, Ecchymosis, Hemosiderin Staining, Mottled, Pallor, Rubor, Erythema. Periwound temperature was noted as No Abnormality. Wound #4 status is Open. Original cause of wound was Gradually Appeared. The wound is located on the Right,Proximal,Lateral Lower Leg. The wound measures 0.4cm length x 0.4cm width x 0.1cm depth; 0.126cm^2 area and 0.013cm^3 volume. There is no tunneling or undermining noted. There is a medium amount of serous drainage noted. The wound margin is flat and intact. There is no granulation within the wound bed. There is a large (67-100%) amount of necrotic tissue within the wound bed including Eschar. The periwound skin appearance did not exhibit: Callus, Crepitus, Excoriation, Induration, Rash, Scarring, Dry/Scaly, Maceration, Atrophie Blanche, Cyanosis, Ecchymosis, Hemosiderin Staining, Mottled, Pallor, Rubor, Erythema. Periwound temperature was noted as No Abnormality. The periwound has tenderness on palpation. Wound #5 status is Open. Original cause of wound was Gradually Appeared. The wound is located on the Right,Distal,Lateral Lower Leg. The wound measures 0.4cm length x 0.4cm width x 0.1cm depth; 0.126cm^2 area and 0.013cm^3 volume. There Bonine, Irven E. ([id number]) is no tunneling or undermining noted. There is a none present amount of drainage noted. The wound margin is flat and intact. There is no granulation within the wound bed. There is a large (67-100%) amount of necrotic tissue within the wound bed including Eschar. The periwound skin appearance exhibited: Erythema. The surrounding wound skin color is noted with erythema which is circumferential.  Periwound temperature was noted as No Abnormality. The periwound has tenderness on palpation. Wound #6 status is Open. Original cause of wound was Gradually Appeared. The wound is located on the Right,Lateral Malleolus. The wound measures 0.5cm length x 0.5cm width x 0.2cm depth; 0.196cm^2 area and 0.039cm^3 volume. There is Fat Layer (Subcutaneous Tissue) Exposed exposed. There is no tunneling or undermining noted. There is a medium amount of serous drainage noted. The wound margin is flat and intact. There is no granulation within the wound bed. There is a large (67-100%) amount of necrotic tissue within the wound bed including Eschar. The periwound skin appearance did not exhibit: Callus, Crepitus, Excoriation, Induration, Rash, Scarring, Dry/Scaly, Maceration, Atrophie Blanche, Cyanosis, Ecchymosis, Hemosiderin Staining, Mottled, Pallor, Rubor, Erythema. Wound #7 status is Healed - Epithelialized. Original cause of wound was Gradually Appeared. The wound is located on the Right,Dorsal  [name]. The wound measures 0cm length x 0cm width x 0cm depth; 0cm^2 area and 0cm^3 volume. There is no tunneling or undermining noted. There is a small amount of serous drainage noted. The wound margin is flat and intact. There is no granulation within the wound bed. There is a large (67-100%) amount of necrotic tissue within the wound bed including Eschar. The periwound skin appearance exhibited: Erythema. The periwound skin appearance did not exhibit: Callus, Crepitus, Excoriation, Induration, Rash, Scarring, Dry/Scaly, Maceration, Atrophie Blanche, Cyanosis, Ecchymosis, Hemosiderin Staining, Mottled, Pallor, Rubor. The surrounding wound skin color is noted with erythema which is circumferential. Periwound temperature was noted as No Abnormality. The periwound has tenderness on palpation. 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 There was a Excisional Skin/Subcutaneous Tissue Debridement with a total area of 0.16 sq cm performed by Ricard Dillon, MD. With the following instrument(s): Curette. to remove Viable and Non-Viable tissue/material Material removed includes Eschar, Subcutaneous Tissue, and [place] after achieving pain control using Other (lidocaine 4%). No specimens were taken. A time out was conducted at 16:00, prior to the start of the procedure. A Minimum amount of bleeding was controlled with Pressure. The procedure was tolerated well with a pain level of 2 throughout and a pain level of 2 following the procedure. Post Debridement Measurements: 0.4cm length x 0.4cm width x 0.1cm depth; 0.013cm^3 volume. Character of Wound/Ulcer Post Debridement is stable. Severity of Tissue Post Debridement is: Fat layer exposed. Post procedure Diagnosis Wound #4: Same as Pre-Procedure Wound #6 Pre-procedure diagnosis of Wound #6 is an Arterial Insufficiency Ulcer located on the Right,Lateral Malleolus .Severity of [place]ide, BENJIMIN HADDEN. ([id number]) Tissue Pre Debridement is: Fat layer exposed. There was a Excisional Skin/Subcutaneous Tissue Debridement with a total area of 0.25 sq cm performed by Ricard Dillon, MD. With the following instrument(s): Curette. to remove Viable and Non-Viable tissue/material Material removed includes Eschar, Subcutaneous Tissue, and [place] after achieving pain control using Other (lidocaine 4%). No specimens were taken. A time out was conducted at 16:00, prior to  the start of the procedure. A Minimum amount of bleeding was controlled with Pressure. The procedure was tolerated well with a pain level of 2 throughout and a pain level of 2 following the procedure. Post Debridement Measurements: 0.5cm length x 0.5cm width x 0.3cm depth; 0.059cm^3 volume. Character of Wound/Ulcer Post Debridement is stable. Severity of Tissue Post Debridement is: Fat layer exposed. Post procedure Diagnosis Wound #6: Same as Pre-Procedure Plan Wound Cleansing: Wound #4 Right,Proximal,Lateral Lower Leg: Clean wound with Normal Saline. Wound #5 Right,Distal,Lateral Lower Leg: Clean wound with Normal Saline. Wound #6 Right,Lateral Malleolus: Clean wound with Normal Saline. Anesthetic (add to Medication List): Wound #4 Right,Proximal,Lateral Lower Leg: Topical Lidocaine 4% cream applied to wound bed prior to debridement (In Clinic Only). Wound #5 Right,Distal,Lateral Lower Leg: Topical Lidocaine 4% cream applied to wound bed prior to debridement (In Clinic Only). Wound #6 Right,Lateral Malleolus: Topical Lidocaine 4% cream applied to wound bed prior to debridement (In Clinic Only). Primary Wound Dressing: Wound #4 Right,Proximal,Lateral  Lower Leg: Silver Collagen - all wounds Wound #5 Right,Distal,Lateral Lower Leg: Silver Collagen - all wounds Wound #6 Right,Lateral Malleolus: Silver Collagen - all wounds Secondary Dressing: Wound #4 Right,Proximal,Lateral Lower Leg: ABD pad - over collegen, Unna paste to Anchor at top Wound #5 Right,Distal,Lateral Lower Leg: ABD pad - over collegen, Unna paste to Anchor at top Wound #6 Right,Lateral Malleolus: ABD pad - over collegen, Unna paste to Anchor at top Dressing Change Frequency: Wound #4 Right,Proximal,Lateral Lower Leg: Change Dressing Monday, Wednesday, Friday Wound #5 Right,Distal,Lateral Lower Leg: Change Dressing Monday, Wednesday, Friday Wound #6 Right,Lateral Malleolus: Change Dressing Monday, Wednesday,  Friday Follow-up Appointments: Wound #4 Right,Proximal,Lateral Lower Leg: Return Appointment in 1 week. Wound #5 Right,Distal,Lateral Lower Leg: Return Appointment in 1 week. Wound #6 Right,Lateral Malleolus: Return Appointment in 1 week. THURLOW, GALLAGA ([id number]) Edema Control: Wound #4 Right,Proximal,Lateral Lower Leg: 3 Layer Compression System - Right Lower Extremity Wound #5 Right,Distal,Lateral Lower Leg: 3 Layer Compression System - Right Lower Extremity Wound #6 Right,Lateral Malleolus: 3 Layer Compression System - Right Lower Extremity Home Health: Wound #4 Right,Proximal,Lateral Lower Leg: [place] Nurse may visit PRN to address patient s wound care needs. FACE TO FACE ENCOUNTER: MEDICARE and MEDICAID PATIENTS: I certify that this patient is under my care and that I had a face-to-face encounter that meets the physician face-to-face encounter requirements with this patient on this date. The encounter with the patient was in whole or in part for the following MEDICAL CONDITION: (primary reason for [place]) MEDICAL NECESSITY: I certify, that based on my findings, NURSING services are a medically necessary home health service. HOME BOUND STATUS: I certify that my clinical findings support that this patient is homebound (i.e., Due to illness or injury, pt requires aid of supportive devices such as crutches, cane, wheelchairs, walkers, the use of special transportation or the assistance of another person to leave their place of residence. There is a normal inability to leave the home and doing so requires considerable and taxing effort. Other absences are for medical reasons / religious services and are infrequent or of short duration when for other reasons). If current dressing causes regression in wound condition, may D/C ordered dressing product/s and apply Normal Saline Moist Dressing daily until next [place] / Other MD  appointment. [place] of regression in wound condition at [phone]. Please direct any NON-WOUND related issues/requests for orders to patient's Primary Care Physician Wound #5 Right,Distal,Lateral Lower Leg: [place] Nurse may visit PRN to address patient s wound care needs. FACE TO FACE ENCOUNTER: MEDICARE and MEDICAID PATIENTS: I certify that this patient is under my care and that I had a face-to-face encounter that meets the physician face-to-face encounter requirements with this patient on this date. The encounter with the patient was in whole or in part for the following MEDICAL CONDITION: (primary reason for [place]) MEDICAL NECESSITY: I certify, that based on my findings, NURSING services are a medically necessary home health service. HOME BOUND STATUS: I certify that my clinical findings support that this patient is homebound (i.e., Due to illness or injury, pt requires aid of supportive devices such as crutches, cane, wheelchairs, walkers, the use of special transportation or the assistance of another person to leave their place of residence. There is a normal inability to leave the home and doing so requires considerable and taxing effort. Other absences are for medical reasons / religious services and are  infrequent or of short duration when for other reasons). If current dressing causes regression in wound condition, may D/C ordered dressing product/s and apply Normal Saline Moist Dressing daily until next [place] / Other MD appointment. [place] of regression in wound condition at [phone]. Please direct any NON-WOUND related issues/requests for orders to patient's Primary Care Physician Wound #6 Right,Lateral Malleolus: [place] Nurse may visit PRN to address patient s wound care needs. FACE TO FACE ENCOUNTER: MEDICARE and MEDICAID PATIENTS: I certify  that this patient is under my care and that I had a face-to-face encounter that meets the physician face-to-face encounter requirements with this patient on this date. The encounter with the patient was in whole or in part for the following MEDICAL CONDITION: (primary reason for [place]) MEDICAL NECESSITY: I certify, that based on my findings, NURSING services are a medically necessary home health service. HOME BOUND STATUS: I certify that my clinical findings support that this patient is homebound (i.e., Due to illness or injury, pt requires aid of supportive devices such as crutches, cane, wheelchairs, walkers, the use of special transportation or the assistance of another person to leave their place of residence. There is a normal inability to leave the home and doing so requires considerable and taxing effort. Other absences are for medical reasons / religious services and are infrequent or of short duration when for other reasons). If current dressing causes regression in wound condition, may D/C ordered dressing product/s and apply Normal Saline Moist Dressing daily until next [place] / Other MD appointment. [place] of regression in wound condition at [phone]. Please direct any NON-WOUND related issues/requests for orders to patient's Primary Care Physician HUGHEY, RITTENBERRY ([id number]) #1Silver collagen to all wounds/ABDs #23layer impression that he is tolerating well. #3 I don't see anything new here. Nothing that I really felt needed to be biopsied. #4 there is a lot of reason to believe there is an exotic diagnosis here however I am unsure about this. Most of his areas that seem to be improving. We'll check his arterial studies on the 30th. He actually came [place] [facility] however Electronic Signature(s) Signed: 04/22/2017 5:09:03 PM By: Linton Ham MD Entered By: Linton Ham on 04/22/2017 16:53:51 [place], [name]  ([id number]) -------------------------------------------------------------------------------- SuperBill Details Patient Name: [name] Date of Service: 04/22/2017 Medical Record Number: [id number] Patient Account Number: [id number] Date of Birth/Sex: 06/11/1940 (76 y.o. M) Treating RN: Cornell Barman Primary Care Provider: Cyndi Bender Other Clinician: Referring Provider: Cyndi Bender Treating Provider/Extender: Tito Dine in Treatment: 2 Diagnosis Coding ICD-10 Codes Code Description [phone] Non-pressure chronic ulcer of other part of left [name] limited to breakdown of skin L97.211 Non-pressure chronic ulcer of right calf limited to breakdown of skin L97.511 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 limited to breakdown of skin I25.5 Ischemic cardiomyopathy Facility Procedures CPT4 Code: [id number] Description: 83094 - DEB SUBQ TISSUE 20 SQ CM/< ICD-10 Diagnosis Description 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

## 2017-04-27 NOTE — Progress Notes (Signed)
STAR, CHEESE ([id number]) Visit Report for 04/22/2017 Arrival Information Details Patient Name: [name], [name] Date of Service: 04/22/2017 3:15 PM Medical Record Number: [id number] Patient Account Number: [id number] Date of Birth/Sex: June 12, 1940 (76 y.o. M) Treating RN: Roger Shelter Primary Care Brunette Lavalle: Cyndi Bender Other Clinician: Referring Rohit Deloria: Cyndi Bender Treating [place]/Extender: Tito Dine in Treatment: 2 Visit Information History Since Last Visit All ordered tests and consults were completed: No Patient Arrived: Walker Added or deleted any medications: No Arrival Time: 15:43 Any new allergies or adverse reactions: No Accompanied By: wife Had a fall or experienced change in No Transfer Assistance: None activities of daily living that may affect Patient Identification Verified: Yes risk of falls: Secondary Verification Process Completed: Yes Signs or symptoms of abuse/neglect since last visito No Patient Requires Transmission-Based No Hospitalized since last visit: No Precautions: Implantable device outside of the clinic excluding No Patient Has Alerts: Yes cellular tissue based products placed in the center Patient Alerts: R ABI since last visit: >220 Pain Present Now: No Electronic Signature(s) Signed: 04/22/2017 4:34:44 PM By: Roger Shelter Entered By: Roger Shelter on 04/22/2017 15:43:31 Kalafut, [name] ([id number]) -------------------------------------------------------------------------------- Encounter Discharge Information Details Patient Name: [name] Date of Service: 04/22/2017 3:15 PM Medical Record Number: [id number] Patient Account Number: [id number] Date of Birth/Sex: 1940-02-12 (76 y.o. M) Treating RN: Cornell Barman Primary Care Preesha Benjamin: Cyndi Bender Other Clinician: Referring Breydon Senters: Cyndi Bender Treating Ethal Gotay/Extender: Tito Dine in Treatment: 2 Encounter Discharge  Information Items Discharge Pain Level: 0 Discharge Condition: Stable Ambulatory Status: Walker Discharge Destination: Home Transportation: Private Auto Accompanied By: spouse Schedule Follow-up Appointment: Yes Medication Reconciliation completed and No provided to Patient/Care Rejoice Heatwole: Provided on Clinical Summary of Care: 04/22/2017 Form Type Recipient Paper Patient LM Electronic Signature(s) Signed: 04/22/2017 4:38:38 PM By: Montey Hora Entered By: Montey Hora on 04/22/2017 16:38:38 Rufer, [name] ([id number]) -------------------------------------------------------------------------------- Lower Extremity Assessment Details Patient Name: [name] Date of Service: 04/22/2017 3:15 PM Medical Record Number: [id number] Patient Account Number: [id number] Date of Birth/Sex: 30-Apr-1940 (76 y.o. M) Treating RN: Roger Shelter Primary Care Shandy Vi: Cyndi Bender Other Clinician: Referring Tressia Labrum: Cyndi Bender Treating Heaven Meeker/Extender: Tito Dine in Treatment: 2 Edema Assessment Assessed: [Left: No] [Right: No] [Left: Edema] [Right: :] Calf Left: Right: Point of Measurement: 27 cm From Medial Instep cm 30.6 cm Ankle Left: Right: Point of Measurement: 11 cm From Medial Instep cm 22.2 cm Vascular Assessment Claudication: Claudication Assessment [Right:None] Pulses: Dorsalis Pedis Palpable: [Right:Yes] Posterior Tibial Extremity colors, hair growth, and conditions: Extremity Color: [Right:Hyperpigmented] Hair Growth on Extremity: [Right:No] Temperature of Extremity: [Right:Cool] Capillary Refill: [Right:> 3 seconds] Toe Nail Assessment Left: Right: Thick: Yes Discolored: Yes Deformed: Yes Improper Length and Hygiene: Yes Electronic Signature(s) Signed: 04/22/2017 4:34:44 PM By: Roger Shelter Entered By: Roger Shelter on 04/22/2017 15:58:49 Herbold, [name]  ([id number]) -------------------------------------------------------------------------------- Multi Wound Chart Details Patient Name: [name] Date of Service: 04/22/2017 3:15 PM Medical Record Number: [id number] Patient Account Number: [id number] Date of Birth/Sex: February 01, 1940 (76 y.o. M) Treating RN: Cornell Barman Primary Care Manases Etchison: Cyndi Bender Other Clinician: Referring Genoveva Singleton: Cyndi Bender Treating Dajon Lazar/Extender: Tito Dine in Treatment: 2 Vital Signs Height(in): 68 Pulse(bpm): 37 Weight(lbs): Blood Pressure(mmHg): 92/58 Body Mass Index(BMI): Temperature(F): 97.8 Respiratory Rate 18 (breaths/min): Photos: Wound Location: Right, Proximal, Medial Lower Right, Distal, Medial Lower Right, Anterior Lower Leg Leg Leg Wounding Event: Gradually Appeared Gradually Appeared Gradually Appeared Primary Etiology: Arterial Insufficiency Ulcer Arterial Insufficiency Ulcer Arterial Insufficiency Ulcer  Comorbid History: Congestive Heart Failure, Congestive Heart Failure, Congestive Heart Failure, Coronary Artery Disease, Coronary Artery Disease, Coronary Artery Disease, Hypertension, Myocardial Hypertension, Myocardial Hypertension, Myocardial Infarction, Osteoarthritis Infarction, Osteoarthritis Infarction, Osteoarthritis Date Acquired: 01/15/2017 01/15/2017 01/15/2017 Weeks of Treatment: 2 2 2  Wound Status: Healed - Epithelialized Healed - Epithelialized Healed - Epithelialized Clustered Quantity: 9 5 N/A Measurements L x W x D 0x0x0 0x0x0 0x0x0 (cm) Area (cm) : 0 0 0 Volume (cm) : 0 0 0 % Reduction in Area: 100.00% 100.00% 100.00% % Reduction in Volume: 100.00% 100.00% 100.00% Classification: Full Thickness Without Full Thickness Without Full Thickness Without Exposed Support Structures Exposed Support Structures Exposed Support Structures Exudate Amount: Medium Medium Large Exudate Type: Serous Serous Sanguinous Exudate Color: amber amber  red Wound Margin: Distinct, outline attached Flat and Intact Flat and Intact Granulation Amount: None Present (0%) None Present (0%) None Present (0%) Necrotic Amount: Large (67-100%) Large (67-100%) Large (67-100%) Necrotic Tissue: Eschar, Adherent Slough Eschar Eschar, Adherent Slough Exposed Structures: Fascia: No Fascia: No Fat Layer (Subcutaneous Fat Layer (Subcutaneous Fat Layer (Subcutaneous Tissue) Exposed: Yes [name], Jermaine E. ([id number]) Tissue) Exposed: No Tissue) Exposed: No Fascia: No Tendon: No Tendon: No Tendon: No Muscle: No Muscle: No Muscle: No Joint: No Joint: No Joint: No Bone: No Bone: No Bone: No Epithelialization: Small (1-33%) None None Debridement: N/A N/A N/A Pain Control: N/A N/A N/A Tissue Debrided: N/A N/A N/A Level: N/A N/A N/A Debridement Area (sq cm): N/A N/A N/A Instrument: N/A N/A N/A Bleeding: N/A N/A N/A Hemostasis Achieved: N/A N/A N/A Procedural Pain: N/A N/A N/A Post Procedural Pain: N/A N/A N/A Debridement Treatment N/A N/A N/A Response: Post Debridement N/A N/A N/A Measurements L x W x D (cm) Post Debridement Volume: N/A N/A N/A (cm) Periwound Skin Texture: Excoriation: No Excoriation: No Excoriation: No Induration: No Induration: No Induration: No Callus: No Callus: No Callus: No Crepitus: No Crepitus: No Crepitus: No Rash: No Rash: No Rash: No Scarring: No Scarring: No Scarring: No Periwound Skin Moisture: Maceration: No Maceration: No Maceration: No Dry/Scaly: No Dry/Scaly: No Dry/Scaly: No Periwound Skin Color: Atrophie Blanche: No Atrophie Blanche: No Atrophie Blanche: No Cyanosis: No Cyanosis: No Cyanosis: No Ecchymosis: No Ecchymosis: No Ecchymosis: No Erythema: No Erythema: No Erythema: No Hemosiderin Staining: No Hemosiderin Staining: No Hemosiderin Staining: No Mottled: No Mottled: No Mottled: No Pallor: No Pallor: No Pallor: No Rubor: No Rubor: No Rubor: No Erythema  Location: N/A N/A N/A Temperature: No Abnormality No Abnormality No Abnormality Tenderness on Palpation: Yes Yes No Wound Preparation: Ulcer Cleansing: Ulcer Cleansing: Ulcer Cleansing: Other: soap Rinsed/Irrigated with Saline, Rinsed/Irrigated with Saline, and water Other: soap and water Other: soap and water Topical Anesthetic Applied: Topical Anesthetic Applied: Topical Anesthetic Applied: None None None Procedures Performed: N/A N/A N/A Wound Number: [id number]  Photos: [name], [name] ([id number]) Wound Location: Right Lower Leg - Lateral, Right Lower Leg - Lateral, Right Malleolus - Lateral Proximal Distal Wounding Event: Gradually Appeared Gradually Appeared Gradually Appeared Primary Etiology: Arterial Insufficiency Ulcer Arterial Insufficiency Ulcer Arterial Insufficiency Ulcer Comorbid History: Congestive Heart Failure, Congestive Heart Failure, Congestive Heart Failure, Coronary Artery Disease, Coronary Artery Disease, Coronary Artery Disease, Hypertension, Myocardial Hypertension, Myocardial Hypertension, Myocardial Infarction, Osteoarthritis Infarction, Osteoarthritis Infarction, Osteoarthritis Date Acquired: 04/08/2017 01/15/2017 03/23/2017 Weeks of Treatment: 2 2 2  Wound Status: Open Open Open Clustered Quantity: N/A N/A N/A Measurements L x W x D 0.4x0.4x0.1 0.4x0.4x0.1 0.5x0.5x0.2 (cm) Area (cm) : 0.126 0.126 0.196 Volume (cm) : 0.013 0.013 0.039 % Reduction in  Area: 35.70% 46.60% -532.30% % Reduction in Volume: 78.00% 72.30% -1200.00% Classification: Full Thickness Without Full Thickness Without Full Thickness Without Exposed Support Structures Exposed Support Structures Exposed Support Structures Exudate Amount: Medium None Present Medium Exudate Type: Serous N/A Serous Exudate Color: amber N/A amber Wound Margin: Flat and Intact Flat and Intact Flat and Intact Granulation Amount: None Present (0%) None Present (0%) None Present (0%) Necrotic Amount: Large  (67-100%) Large (67-100%) Large (67-100%) Necrotic Tissue: Eschar Eschar Eschar Exposed Structures: Fascia: No Fascia: No Fat Layer (Subcutaneous Fat Layer (Subcutaneous Fat Layer (Subcutaneous Tissue) Exposed: Yes Tissue) Exposed: No Tissue) Exposed: No Fascia: No Tendon: No Tendon: No Tendon: No Muscle: No Muscle: No Muscle: No Joint: No Joint: No Joint: No Bone: No Bone: No Bone: No Epithelialization: None None None Debridement: Debridement - Excisional N/A Debridement - Excisional Pre-procedure 16:00 N/A 16:00 Verification/Time Out Taken: Pain Control: Other N/A Other Tissue Debrided: Necrotic/Eschar, N/A Necrotic/Eschar, Subcutaneous, Slough Subcutaneous, Slough Level: Skin/Subcutaneous Tissue N/A Skin/Subcutaneous Tissue Debridement Area (sq cm): 0.16 N/A 0.25 Instrument: Curette N/A Curette Bleeding: Minimum N/A Minimum Hemostasis Achieved: Pressure N/A Pressure Procedural Pain: 2 N/A 2 Post Procedural Pain: 2 N/A 2 Debridement Treatment Procedure was tolerated well N/A Procedure was tolerated well Response: Post Debridement 0.4x0.4x0.1 N/A 0.5x0.5x0.3 Measurements L x W x D (cm) Post Debridement Volume: 0.013 N/A 0.059 (cm) Periwound Skin Texture: No Abnormalities Noted [name], Jermaine E. ([id number]) Excoriation: No Excoriation: No Induration: No Induration: No Callus: No Callus: No Crepitus: No Crepitus: No Rash: No Rash: No Scarring: No Scarring: No Periwound Skin Moisture: Maceration: No No Abnormalities Noted Maceration: No Dry/Scaly: No Dry/Scaly: No Periwound Skin Color: Atrophie Blanche: No Erythema: Yes Atrophie Blanche: No Cyanosis: No Cyanosis: No Ecchymosis: No Ecchymosis: No Erythema: No Erythema: No Hemosiderin Staining: No Hemosiderin Staining: No Mottled: No Mottled: No Pallor: No Pallor: No Rubor: No Rubor: No Erythema Location: N/A Circumferential N/A Temperature: No Abnormality No Abnormality N/A Tenderness  on Palpation: Yes Yes No Wound Preparation: Ulcer Cleansing: Ulcer Cleansing: Ulcer Cleansing: Rinsed/Irrigated with Saline, Rinsed/Irrigated with Saline, Rinsed/Irrigated with Saline Other: soap and water Other: soap and water Topical Anesthetic Applied: Topical Anesthetic Applied: None None Procedures Performed: Debridement N/A Debridement Wound Number: 7 N/A N/A Photos: N/A N/A Wound Location: Right [name] - Dorsal N/A N/A Wounding Event: Gradually Appeared N/A N/A Primary Etiology: Lupus N/A N/A Comorbid History: Congestive Heart Failure, N/A N/A Coronary Artery Disease, Hypertension, Myocardial Infarction, Osteoarthritis Date Acquired: 04/22/2017 N/A N/A Weeks of Treatment: 0 N/A N/A Wound Status: Healed - Epithelialized N/A N/A Clustered Quantity: N/A N/A N/A Measurements L x W x D 0x0x0 N/A N/A (cm) Area (cm) : 0 N/A N/A Volume (cm) : 0 N/A N/A % Reduction in Area: N/A N/A N/A % Reduction in Volume: N/A N/A N/A Classification: Partial Thickness N/A N/A Exudate Amount: Small N/A N/A Exudate Type: Serous N/A N/A Exudate Color: amber N/A N/A [name], Jermaine E. ([id number]) Wound Margin: Flat and Intact N/A N/A Granulation Amount: None Present (0%) N/A N/A Necrotic Amount: Large (67-100%) N/A N/A Necrotic Tissue: Eschar N/A N/A Exposed Structures: N/A N/A N/A Epithelialization: Small (1-33%) N/A N/A Debridement: N/A N/A N/A Pain Control: N/A N/A N/A Tissue Debrided: N/A N/A N/A Level: N/A N/A N/A Debridement Area (sq cm): N/A N/A N/A Instrument: N/A N/A N/A Bleeding: N/A N/A N/A Hemostasis Achieved: N/A N/A N/A Procedural Pain: N/A N/A N/A Post Procedural Pain: N/A N/A N/A Debridement Treatment N/A N/A N/A Response: Post Debridement N/A N/A N/A Measurements L x  W x D (cm) Post Debridement Volume: N/A N/A N/A (cm) Periwound Skin Texture: Excoriation: No N/A N/A Induration: No Callus: No Crepitus: No Rash: No Scarring: No Periwound Skin  Moisture: Maceration: No N/A N/A Dry/Scaly: No Periwound Skin Color: Erythema: Yes N/A N/A Atrophie Blanche: No Cyanosis: No Ecchymosis: No Hemosiderin Staining: No Mottled: No Pallor: No Rubor: No Erythema Location: Circumferential N/A N/A Temperature: No Abnormality N/A N/A Tenderness on Palpation: Yes N/A N/A Wound Preparation: Ulcer Cleansing: N/A N/A Rinsed/Irrigated with Saline Topical Anesthetic Applied: None Procedures Performed: N/A N/A N/A Treatment Notes Wound #4 (Right, Proximal, Lateral Lower Leg) 1. Cleansed with: Clean wound with Normal Saline 2. Anesthetic Topical Lidocaine 4% cream to wound bed prior to debridement 4. Dressing Applied: [name], [name] ([id number]) Prisma Ag 5. Secondary Dressing Applied ABD Pad 7. Secured with 3 Layer Compression System - Right Lower Extremity Notes unna boot to anchor Wound #5 (Right, Distal, Lateral Lower Leg) 1. Cleansed with: Clean wound with Normal Saline 2. Anesthetic Topical Lidocaine 4% cream to wound bed prior to debridement 4. Dressing Applied: Prisma Ag 5. Secondary Dressing Applied ABD Pad 7. Secured with 3 Layer Compression System - Right Lower Extremity Notes unna boot to anchor Wound #6 (Right, Lateral Malleolus) 1. Cleansed with: Clean wound with Normal Saline 2. Anesthetic Topical Lidocaine 4% cream to wound bed prior to debridement 4. Dressing Applied: Prisma Ag 5. Secondary Dressing Applied ABD Pad 7. Secured with 3 Layer Compression System - Right Lower Extremity Notes unna boot to anchor Electronic Signature(s) Signed: 04/22/2017 5:09:03 PM By: Linton Ham MD Entered By: Linton Ham on 04/22/2017 16:45:34 Meckler, [name] ([id number]) -------------------------------------------------------------------------------- Multi-Disciplinary Care Plan Details Patient Name: [name] Date of Service: 04/22/2017 3:15 PM Medical Record Number: [id number] Patient Account  Number: [id number] Date of Birth/Sex: 1940/03/02 (76 y.o. M) Treating RN: Cornell Barman Primary Care Surafel Hilleary: Cyndi Bender Other Clinician: Referring Vona Whiters: Cyndi Bender Treating Jovahn Breit/Extender: Tito Dine in Treatment: 2 Active Inactive ` Abuse / Safety / Falls / Self Care Management Nursing Diagnoses: History of Falls Goals: Patient will remain injury free related to falls Date Initiated: 04/08/2017 Target Resolution Date: 05/08/2017 Goal Status: Active Interventions: Assess fall risk on admission and as needed Notes: ` Orientation to the Wound Care Program Nursing Diagnoses: Knowledge deficit related to the wound healing center program Goals: Patient/caregiver will verbalize understanding of the [facility] Date Initiated: 04/08/2017 Target Resolution Date: 05/08/2017 Goal Status: Active Interventions: Provide education on orientation to the wound center Notes: ` Soft Tissue Infection Nursing Diagnoses: Impaired tissue integrity Potential for infection: soft tissue Goals: Patient will remain free of wound infection Date Initiated: 04/08/2017 Target Resolution Date: 05/08/2017 Goal Status: Active [name], [name] ([id number]) Interventions: Assess signs and symptoms of infection every visit Notes: ` Wound/Skin Impairment Nursing Diagnoses: Impaired tissue integrity Goals: Ulcer/skin breakdown will heal within 14 weeks Date Initiated: 04/08/2017 Target Resolution Date: 07/20/2017 Goal Status: Active Interventions: Assess patient/caregiver ability to perform ulcer/skin care regimen upon admission and as needed Provide education on ulcer and skin care Treatment Activities: Topical wound management initiated : 04/08/2017 Notes: Electronic Signature(s) Signed: 04/22/2017 5:11:52 PM By: Gretta Cool, BSN, RN, CWS, Kim RN, BSN Entered By: Gretta Cool, BSN, RN, CWS, Kim on 04/22/2017 16:06:29 Harris, [name]  ([id number]) -------------------------------------------------------------------------------- Pain Assessment Details Patient Name: [name] Date of Service: 04/22/2017 3:15 PM Medical Record Number: [id number] Patient Account Number: [id number] Date of Birth/Sex: [birth year] (76 y.o. M) Treating RN: Roger Shelter Primary Care  Kennede Lusk: Cyndi Bender Other Clinician: Referring Alayiah Fontes: Cyndi Bender Treating Syrenity Klepacki/Extender: Tito Dine in Treatment: 2 Active Problems Location of Pain Severity and Description of Pain Patient Has Paino No Site Locations Pain Management and Medication Current Pain Management: Electronic Signature(s) Signed: 04/22/2017 4:34:44 PM By: Roger Shelter Entered By: Roger Shelter on 04/22/2017 15:43:37 Burgo, [name] ([id number]) -------------------------------------------------------------------------------- Patient/Caregiver Education Details Patient Name: [name] Date of Service: 04/22/2017 3:15 PM Medical Record Number: [id number] Patient Account Number: [id number] Date of Birth/Gender: [birth year] (76 y.o. M) Treating RN: Montey Hora Primary Care Physician: Cyndi Bender Other Clinician: Referring Physician: Cyndi Bender Treating Physician/Extender: Tito Dine in Treatment: 2 Education Assessment Education Provided To: Patient Education Topics Provided Venous: Handouts: Other: leg elevation Methods: Explain/Verbal Responses: State content correctly Electronic Signature(s) Signed: 04/22/2017 4:39:21 PM By: Montey Hora Entered By: Montey Hora on 04/22/2017 16:38:56 Calzada, [name] ([id number]) -------------------------------------------------------------------------------- Wound Assessment Details Patient Name: [name] Date of Service: 04/22/2017 3:15 PM Medical Record Number: [id number] Patient Account Number: [id number] Date of Birth/Sex: Jan 18, 1940 (76 y.o.  M) Treating RN: Cornell Barman Primary Care Shams Fill: Cyndi Bender Other Clinician: Referring Lailany [name]: Cyndi Bender Treating Daimon Kean/Extender: Tito Dine in Treatment: 2 Wound Status Wound Number: 1 Primary Arterial Insufficiency Ulcer Etiology: Wound Location: Right, Proximal, Medial Lower Leg Wound Healed - Epithelialized Wounding Event: Gradually Appeared Status: Date Acquired: 01/15/2017 Comorbid Congestive Heart Failure, Coronary Artery Weeks Of Treatment: 2 History: Disease, Hypertension, Myocardial Infarction, Clustered Wound: No Osteoarthritis Photos Photo Uploaded By: Roger Shelter on 04/22/2017 16:17:37 Wound Measurements Length: (cm) 0 % R Width: (cm) 0 % R Depth: (cm) 0 Epi Clustered Quantity: 9 Tun Area: (cm) 0 Un Volume: (cm) 0 eduction in Area: 100% eduction in Volume: 100% thelialization: Small (1-33%) neling: No dermining: No Wound Description Full Thickness Without Exposed Support Classification: Structures Wound Margin: Distinct, outline attached Exudate Medium Amount: Exudate Type: Serous Exudate Color: amber Foul Odor After Cleansing: No Slough/Fibrino Yes Wound Bed Granulation Amount: None Present (0%) Exposed Structure Necrotic Amount: Large (67-100%) Fascia Exposed: No Necrotic Quality: Eschar, Adherent Slough Fat Layer (Subcutaneous Tissue) Exposed: No Tendon Exposed: No Muscle Exposed: No Joint Exposed: No [name], Jermaine E. ([id number]) Bone Exposed: No Periwound Skin Texture Texture Color No Abnormalities Noted: No No Abnormalities Noted: No Callus: No Atrophie Blanche: No Crepitus: No Cyanosis: No Excoriation: No Ecchymosis: No Induration: No Erythema: No Rash: No Hemosiderin Staining: No Scarring: No Mottled: No Pallor: No Moisture Rubor: No No Abnormalities Noted: No Dry / Scaly: No Temperature / Pain Maceration: No Temperature: No Abnormality Tenderness on Palpation: Yes Wound  Preparation Ulcer Cleansing: Rinsed/Irrigated with Saline, Other: soap and water, Topical Anesthetic Applied: None Electronic Signature(s) Signed: 04/22/2017 5:11:52 PM By: Gretta Cool, BSN, RN, CWS, Kim RN, BSN Entered By: Gretta Cool, BSN, RN, CWS, Kim on 04/22/2017 16:11:31 [name], [name] ([id number]) -------------------------------------------------------------------------------- Wound Assessment Details Patient Name: [name] Date of Service: 04/22/2017 3:15 PM Medical Record Number: [id number] Patient Account Number: [id number] Date of Birth/Sex: [birth year] (76 y.o. M) Treating RN: Cornell Barman Primary Care Jurell Basista: Cyndi Bender Other Clinician: Referring Deeya Richeson: Cyndi Bender Treating Akon Reinoso/Extender: Tito Dine in Treatment: 2 Wound Status Wound Number: 2 Primary Arterial Insufficiency Ulcer Etiology: Wound Location: Right, Distal, Medial Lower Leg Wound Healed - Epithelialized Wounding Event: Gradually Appeared Status: Date Acquired: 01/15/2017 Comorbid Congestive Heart Failure, Coronary Artery Weeks Of Treatment: 2 History: Disease, Hypertension, Myocardial Infarction, Clustered Wound: No Osteoarthritis Photos Photo Uploaded By: Roger Shelter on 04/22/2017  16:18:21 Wound Measurements Length: (cm) 0 % R Width: (cm) 0 % R Depth: (cm) 0 Epi Clustered Quantity: 5 Tun Area: (cm) 0 Un Volume: (cm) 0 eduction in Area: 100% eduction in Volume: 100% thelialization: None neling: No dermining: No Wound Description Full Thickness Without Exposed Support Classification: Structures Wound Margin: Flat and Intact Exudate Medium Amount: Exudate Type: Serous Exudate Color: amber Foul Odor After Cleansing: No Slough/Fibrino Yes Wound Bed Granulation Amount: None Present (0%) Exposed Structure Necrotic Amount: Large (67-100%) Fascia Exposed: No Necrotic Quality: Eschar Fat Layer (Subcutaneous Tissue) Exposed: No Tendon Exposed: No Muscle  Exposed: No Joint Exposed: No [name], Jermaine E. ([id number]) Bone Exposed: No Periwound Skin Texture Texture Color No Abnormalities Noted: No No Abnormalities Noted: No Callus: No Atrophie Blanche: No Crepitus: No Cyanosis: No Excoriation: No Ecchymosis: No Induration: No Erythema: No Rash: No Hemosiderin Staining: No Scarring: No Mottled: No Pallor: No Moisture Rubor: No No Abnormalities Noted: No Dry / Scaly: No Temperature / Pain Maceration: No Temperature: No Abnormality Tenderness on Palpation: Yes Wound Preparation Ulcer Cleansing: Rinsed/Irrigated with Saline, Other: soap and water, Topical Anesthetic Applied: None Electronic Signature(s) Signed: 04/22/2017 5:11:52 PM By: Gretta Cool, BSN, RN, CWS, Kim RN, BSN Entered By: Gretta Cool, BSN, RN, CWS, Kim on 04/22/2017 16:11:31 [name], [name] ([id number]) -------------------------------------------------------------------------------- Wound Assessment Details Patient Name: [name] Date of Service: 04/22/2017 3:15 PM Medical Record Number: [id number] Patient Account Number: [id number] Date of Birth/Sex: 1940-04-08 (76 y.o. M) Treating RN: Cornell Barman Primary Care Turon Kilmer: Cyndi Bender Other Clinician: Referring Cecilia Nishikawa: Cyndi Bender Treating Arushi Partridge/Extender: Tito Dine in Treatment: 2 Wound Status Wound Number: 3 Primary Arterial Insufficiency Ulcer Etiology: Wound Location: Right, Anterior Lower Leg Wound Healed - Epithelialized Wounding Event: Gradually Appeared Status: Date Acquired: 01/15/2017 Comorbid Congestive Heart Failure, Coronary Artery Weeks Of Treatment: 2 History: Disease, Hypertension, Myocardial Infarction, Clustered Wound: No Osteoarthritis Photos Photo Uploaded By: Roger Shelter on 04/22/2017 16:18:49 Wound Measurements Length: (cm) 0 % Re Width: (cm) 0 % Re Depth: (cm) 0 Epit Area: (cm) 0 Tun Volume: (cm) 0 Und duction in Area: 100% duction in Volume:  100% helialization: None neling: No ermining: No Wound Description Full Thickness Without Exposed Support Classification: Structures Wound Margin: Flat and Intact Exudate Large Amount: Exudate Type: Sanguinous Exudate Color: red Foul Odor After Cleansing: No Slough/Fibrino Yes Wound Bed Granulation Amount: None Present (0%) Exposed Structure Necrotic Amount: Large (67-100%) Fascia Exposed: No Necrotic Quality: Eschar, Adherent Slough Fat Layer (Subcutaneous Tissue) Exposed: Yes Tendon Exposed: No Muscle Exposed: No Joint Exposed: No Bone Exposed: No [name], Jermaine E. ([id number]) Periwound Skin Texture Texture Color No Abnormalities Noted: No No Abnormalities Noted: No Callus: No Atrophie Blanche: No Crepitus: No Cyanosis: No Excoriation: No Ecchymosis: No Induration: No Erythema: No Rash: No Hemosiderin Staining: No Scarring: No Mottled: No Pallor: No Moisture Rubor: No No Abnormalities Noted: No Dry / Scaly: No Temperature / Pain Maceration: No Temperature: No Abnormality Wound Preparation Ulcer Cleansing: Other: soap and water, Topical Anesthetic Applied: None Electronic Signature(s) Signed: 04/22/2017 5:11:52 PM By: Gretta Cool, BSN, RN, CWS, Kim RN, BSN Entered By: Gretta Cool, BSN, RN, CWS, Kim on 04/22/2017 16:11:32 [place], [name] ([id number]) -------------------------------------------------------------------------------- Wound Assessment Details Patient Name: [name] Date of Service: 04/22/2017 3:15 PM Medical Record Number: [id number] Patient Account Number: [id number] Date of Birth/Sex: 1940/02/17 (76 y.o. M) Treating RN: Roger Shelter Primary Care Dalasia Predmore: Cyndi Bender Other Clinician: Referring Kalep Full: Cyndi Bender Treating Emelia Sandoval/Extender: Ricard Dillon Weeks in Treatment: 2 Wound Status Wound  Number: 4 Primary Arterial Insufficiency Ulcer Etiology: Wound Location: Right Lower Leg - Lateral, Proximal Wound  Open Wounding Event: Gradually Appeared Status: Date Acquired: 04/08/2017 Comorbid Congestive Heart Failure, Coronary Artery Weeks Of Treatment: 2 History: Disease, Hypertension, Myocardial Infarction, Clustered Wound: No Osteoarthritis Photos Photo Uploaded By: Roger Shelter on 04/22/2017 16:19:12 Wound Measurements Length: (cm) 0.4 Width: (cm) 0.4 Depth: (cm) 0.1 Area: (cm) 0.126 Volume: (cm) 0.013 % Reduction in Area: 35.7% % Reduction in Volume: 78% Epithelialization: None Tunneling: No Undermining: No Wound Description Full Thickness Without Exposed Support Classification: Structures Wound Margin: Flat and Intact Exudate Medium Amount: Exudate Type: Serous Exudate Color: amber Foul Odor After Cleansing: No Slough/Fibrino No Wound Bed Granulation Amount: None Present (0%) Exposed Structure Necrotic Amount: Large (67-100%) Fascia Exposed: No Necrotic Quality: Eschar Fat Layer (Subcutaneous Tissue) Exposed: No Tendon Exposed: No Muscle Exposed: No Joint Exposed: No Bone Exposed: No [name], Azariah E. ([id number]) Periwound Skin Texture Texture Color No Abnormalities Noted: No No Abnormalities Noted: No Callus: No Atrophie Blanche: No Crepitus: No Cyanosis: No Excoriation: No Ecchymosis: No Induration: No Erythema: No Rash: No Hemosiderin Staining: No Scarring: No Mottled: No Pallor: No Moisture Rubor: No No Abnormalities Noted: No Dry / Scaly: No Temperature / Pain Maceration: No Temperature: No Abnormality Tenderness on Palpation: Yes Wound Preparation Ulcer Cleansing: Rinsed/Irrigated with Saline, Other: soap and water, Topical Anesthetic Applied: None Treatment Notes Wound #4 (Right, Proximal, Lateral Lower Leg) 1. Cleansed with: Clean wound with Normal Saline 2. Anesthetic Topical Lidocaine 4% cream to wound bed prior to debridement 4. Dressing Applied: Prisma Ag 5. Secondary Dressing Applied ABD Pad 7. Secured with 3 Layer  Compression System - Right Lower Extremity Notes unna boot to anchor Electronic Signature(s) Signed: 04/22/2017 4:34:44 PM By: Roger Shelter Entered By: Roger Shelter on 04/22/2017 16:08:28 Johannsen, [name] ([id number]) -------------------------------------------------------------------------------- Wound Assessment Details Patient Name: [name] Date of Service: 04/22/2017 3:15 PM Medical Record Number: [id number] Patient Account Number: [id number] Date of Birth/Sex: 04-15-1940 (76 y.o. M) Treating RN: Roger Shelter Primary Care Erinn Mendosa: Cyndi Bender Other Clinician: Referring Naveen Lorusso: Cyndi Bender Treating Dametri Ozburn/Extender: Tito Dine in Treatment: 2 Wound Status Wound Number: 5 Primary Arterial Insufficiency Ulcer Etiology: Wound Location: Right Lower Leg - Lateral, Distal Wound Open Wounding Event: Gradually Appeared Status: Date Acquired: 01/15/2017 Comorbid Congestive Heart Failure, Coronary Artery Weeks Of Treatment: 2 History: Disease, Hypertension, Myocardial Infarction, Clustered Wound: No Osteoarthritis Photos Photo Uploaded By: Roger Shelter on 04/22/2017 16:19:37 Wound Measurements Length: (cm) 0.4 Width: (cm) 0.4 Depth: (cm) 0.1 Area: (cm) 0.126 Volume: (cm) 0.013 % Reduction in Area: 46.6% % Reduction in Volume: 72.3% Epithelialization: None Tunneling: No Undermining: No Wound Description Full Thickness Without Exposed Support Classification: Structures Wound Margin: Flat and Intact Exudate None Present Amount: Foul Odor After Cleansing: No Slough/Fibrino Yes Wound Bed Granulation Amount: None Present (0%) Exposed Structure Necrotic Amount: Large (67-100%) Fascia Exposed: No Necrotic Quality: Eschar Fat Layer (Subcutaneous Tissue) Exposed: No Tendon Exposed: No Muscle Exposed: No Joint Exposed: No Bone Exposed: No Periwound Skin Texture Mccaslin, Feras E. ([id number]) Texture Color No  Abnormalities Noted: No No Abnormalities Noted: No Erythema: Yes Moisture Erythema Location: Circumferential No Abnormalities Noted: No Temperature / Pain Temperature: No Abnormality Tenderness on Palpation: Yes Wound Preparation Ulcer Cleansing: Rinsed/Irrigated with Saline, Other: soap and water, Treatment Notes Wound #5 (Right, Distal, Lateral Lower Leg) 1. Cleansed with: Clean wound with Normal Saline 2. Anesthetic Topical Lidocaine 4% cream to wound bed prior to debridement 4. Dressing Applied:  Prisma Ag 5. Secondary Dressing Applied ABD Pad 7. Secured with 3 Layer Compression System - Right Lower Extremity Notes unna boot to anchor Electronic Signature(s) Signed: 04/22/2017 4:34:44 PM By: Roger Shelter Entered By: Roger Shelter on 04/22/2017 16:09:31 Miceli, [name] ([id number]) -------------------------------------------------------------------------------- Wound Assessment Details Patient Name: [name] Date of Service: 04/22/2017 3:15 PM Medical Record Number: [id number] Patient Account Number: [id number] Date of Birth/Sex: January 01, 1941 (76 y.o. M) Treating RN: Roger Shelter Primary Care Rylin Seavey: Cyndi Bender Other Clinician: Referring Isadore Bokhari: Cyndi Bender Treating Earnie Bechard/Extender: Tito Dine in Treatment: 2 Wound Status Wound Number: 6 Primary Arterial Insufficiency Ulcer Etiology: Wound Location: Right Malleolus - Lateral Wound Open Wounding Event: Gradually Appeared Status: Date Acquired: 03/23/2017 Comorbid Congestive Heart Failure, Coronary Artery Weeks Of Treatment: 2 History: Disease, Hypertension, Myocardial Infarction, Clustered Wound: No Osteoarthritis Photos Photo Uploaded By: Roger Shelter on 04/22/2017 16:20:00 Wound Measurements Length: (cm) 0.5 Width: (cm) 0.5 Depth: (cm) 0.2 Area: (cm) 0.196 Volume: (cm) 0.039 % Reduction in Area: -532.3% % Reduction in Volume:  -1200% Epithelialization: None Tunneling: No Undermining: No Wound Description Full Thickness Without Exposed Support Classification: Structures Wound Margin: Flat and Intact Exudate Medium Amount: Exudate Type: Serous Exudate Color: amber Foul Odor After Cleansing: No Slough/Fibrino Yes Wound Bed Granulation Amount: None Present (0%) Exposed Structure Necrotic Amount: Large (67-100%) Fascia Exposed: No Necrotic Quality: Eschar Fat Layer (Subcutaneous Tissue) Exposed: Yes Tendon Exposed: No Muscle Exposed: No Joint Exposed: No Bone Exposed: No Fiveash, Jorden E. ([id number]) Periwound Skin Texture Texture Color No Abnormalities Noted: No No Abnormalities Noted: No Callus: No Atrophie Blanche: No Crepitus: No Cyanosis: No Excoriation: No Ecchymosis: No Induration: No Erythema: No Rash: No Hemosiderin Staining: No Scarring: No Mottled: No Pallor: No Moisture Rubor: No No Abnormalities Noted: No Dry / Scaly: No Maceration: No Wound Preparation Ulcer Cleansing: Rinsed/Irrigated with Saline Topical Anesthetic Applied: None Treatment Notes Wound #6 (Right, Lateral Malleolus) 1. Cleansed with: Clean wound with Normal Saline 2. Anesthetic Topical Lidocaine 4% cream to wound bed prior to debridement 4. Dressing Applied: Prisma Ag 5. Secondary Dressing Applied ABD Pad 7. Secured with 3 Layer Compression System - Right Lower Extremity Notes unna boot to anchor Electronic Signature(s) Signed: 04/22/2017 4:34:44 PM By: Roger Shelter Entered By: Roger Shelter on 04/22/2017 16:08:56 Proch, [name] ([id number]) -------------------------------------------------------------------------------- Wound Assessment Details Patient Name: [name] Date of Service: 04/22/2017 3:15 PM Medical Record Number: [id number] Patient Account Number: [id number] Date of Birth/Sex: [birth year] (76 y.o. M) Treating RN: Cornell Barman Primary Care Rynlee Lisbon: Cyndi Bender Other Clinician: Referring Tia Hieronymus: Cyndi Bender Treating Myrla Malanowski/Extender: Tito Dine in Treatment: 2 Wound Status Wound Number: 7 Primary Lupus Etiology: Wound Location: Right [name] - Dorsal Wound Healed - Epithelialized Wounding Event: Gradually Appeared Status: Date Acquired: 04/22/2017 Comorbid Congestive Heart Failure, Coronary Artery Weeks Of Treatment: 0 History: Disease, Hypertension, Myocardial Infarction, Clustered Wound: No Osteoarthritis Photos Photo Uploaded By: Roger Shelter on 04/22/2017 16:20:20 Wound Measurements Length: (cm) 0 % Re Width: (cm) 0 % Re Depth: (cm) 0 Epit Area: (cm) 0 Tun Volume: (cm) 0 Und duction in Area: duction in Volume: helialization: Small (1-33%) neling: No ermining: No Wound Description Classification: Partial Thickness Wound Margin: Flat and Intact Exudate Amount: Small Exudate Type: Serous Exudate Color: amber Foul Odor After Cleansing: No Slough/Fibrino Yes Wound Bed Granulation Amount: None Present (0%) Necrotic Amount: Large (67-100%) Necrotic Quality: Eschar Periwound Skin Texture Texture Color No Abnormalities Noted: No No Abnormalities Noted: No Callus: No Atrophie Blanche: No  Crepitus: No Cyanosis: No Versteeg, AQEEL NORGAARD ([id number]) Excoriation: No Ecchymosis: No Induration: No Erythema: Yes Rash: No Erythema Location: Circumferential Scarring: No Hemosiderin Staining: No Mottled: No Moisture Pallor: No No Abnormalities Noted: No Rubor: No Dry / Scaly: No Maceration: No Temperature / Pain Temperature: No Abnormality Tenderness on Palpation: Yes Wound Preparation Ulcer Cleansing: Rinsed/Irrigated with Saline Topical Anesthetic Applied: None Electronic Signature(s) Signed: 04/22/2017 5:11:52 PM By: Gretta Cool, BSN, RN, CWS, Kim RN, BSN Entered By: Gretta Cool, BSN, RN, CWS, Kim on 04/22/2017 16:13:18 [place]ford, [name]  ([id number]) -------------------------------------------------------------------------------- Vitals Details Patient Name: [name] Date of Service: 04/22/2017 3:15 PM Medical Record Number: [id number] Patient Account Number: [id number] Date of Birth/Sex: 1940-10-24 (76 y.o. M) Treating RN: Roger Shelter Primary Care Letizia Hook: Cyndi Bender Other Clinician: Referring Shaeleigh Graw: Cyndi Bender Treating Dorinne Graeff/Extender: Tito Dine in Treatment: 2 Vital Signs Time Taken: 15:43 Temperature (F): 97.8 Height (in): 68 Pulse (bpm): 68 Respiratory Rate (breaths/min): 18 Blood Pressure (mmHg): 92/58 Reference Range: 80 - 120 mg / dl Electronic Signature(s) Signed: 04/22/2017 4:34:44 PM By: Roger Shelter Entered By: Roger Shelter on 04/22/2017 15:43:43

## 2017-04-28 DIAGNOSIS — M069 Rheumatoid arthritis, unspecified: Secondary | ICD-10-CM | POA: Diagnosis not present

## 2017-04-28 DIAGNOSIS — M6281 Muscle weakness (generalized): Secondary | ICD-10-CM | POA: Diagnosis not present

## 2017-04-28 DIAGNOSIS — I255 Ischemic cardiomyopathy: Secondary | ICD-10-CM | POA: Diagnosis not present

## 2017-04-28 DIAGNOSIS — I5023 Acute on chronic systolic (congestive) heart failure: Secondary | ICD-10-CM | POA: Diagnosis not present

## 2017-04-28 DIAGNOSIS — I2510 Atherosclerotic heart disease of native coronary artery without angina pectoris: Secondary | ICD-10-CM | POA: Diagnosis not present

## 2017-04-28 DIAGNOSIS — I110 Hypertensive heart disease with heart failure: Secondary | ICD-10-CM | POA: Diagnosis not present

## 2017-04-29 ENCOUNTER — Encounter: Payer: Medicare Other | Admitting: Internal Medicine

## 2017-04-29 DIAGNOSIS — S91001A Unspecified open wound, right ankle, initial encounter: Secondary | ICD-10-CM | POA: Diagnosis not present

## 2017-04-29 DIAGNOSIS — L97212 Non-pressure chronic ulcer of right calf with fat layer exposed: Secondary | ICD-10-CM | POA: Diagnosis not present

## 2017-04-29 DIAGNOSIS — L97211 Non-pressure chronic ulcer of right calf limited to breakdown of skin: Secondary | ICD-10-CM | POA: Diagnosis not present

## 2017-04-29 DIAGNOSIS — R531 Weakness: Secondary | ICD-10-CM | POA: Diagnosis not present

## 2017-04-29 DIAGNOSIS — L97219 Non-pressure chronic ulcer of right calf with unspecified severity: Secondary | ICD-10-CM | POA: Diagnosis not present

## 2017-04-29 DIAGNOSIS — L97521 Non-pressure chronic ulcer of other part of left foot limited to breakdown of skin: Secondary | ICD-10-CM | POA: Diagnosis not present

## 2017-04-29 DIAGNOSIS — I255 Ischemic cardiomyopathy: Secondary | ICD-10-CM | POA: Diagnosis not present

## 2017-04-29 DIAGNOSIS — L97819 Non-pressure chronic ulcer of other part of right lower leg with unspecified severity: Secondary | ICD-10-CM | POA: Diagnosis not present

## 2017-04-29 DIAGNOSIS — I70232 Atherosclerosis of native arteries of right leg with ulceration of calf: Secondary | ICD-10-CM | POA: Diagnosis not present

## 2017-04-29 DIAGNOSIS — L97511 Non-pressure chronic ulcer of other part of right foot limited to breakdown of skin: Secondary | ICD-10-CM | POA: Diagnosis not present

## 2017-04-29 DIAGNOSIS — L97519 Non-pressure chronic ulcer of other part of right foot with unspecified severity: Secondary | ICD-10-CM | POA: Diagnosis not present

## 2017-04-30 DIAGNOSIS — I2510 Atherosclerotic heart disease of native coronary artery without angina pectoris: Secondary | ICD-10-CM | POA: Diagnosis not present

## 2017-04-30 DIAGNOSIS — I110 Hypertensive heart disease with heart failure: Secondary | ICD-10-CM | POA: Diagnosis not present

## 2017-04-30 DIAGNOSIS — I5023 Acute on chronic systolic (congestive) heart failure: Secondary | ICD-10-CM | POA: Diagnosis not present

## 2017-04-30 DIAGNOSIS — M6281 Muscle weakness (generalized): Secondary | ICD-10-CM | POA: Diagnosis not present

## 2017-04-30 DIAGNOSIS — I255 Ischemic cardiomyopathy: Secondary | ICD-10-CM | POA: Diagnosis not present

## 2017-04-30 DIAGNOSIS — M069 Rheumatoid arthritis, unspecified: Secondary | ICD-10-CM | POA: Diagnosis not present

## 2017-05-01 DIAGNOSIS — I5023 Acute on chronic systolic (congestive) heart failure: Secondary | ICD-10-CM | POA: Diagnosis not present

## 2017-05-01 DIAGNOSIS — I472 Ventricular tachycardia: Secondary | ICD-10-CM | POA: Diagnosis not present

## 2017-05-01 DIAGNOSIS — I2510 Atherosclerotic heart disease of native coronary artery without angina pectoris: Secondary | ICD-10-CM | POA: Diagnosis not present

## 2017-05-01 DIAGNOSIS — I110 Hypertensive heart disease with heart failure: Secondary | ICD-10-CM | POA: Diagnosis not present

## 2017-05-01 DIAGNOSIS — I5022 Chronic systolic (congestive) heart failure: Secondary | ICD-10-CM | POA: Diagnosis not present

## 2017-05-01 DIAGNOSIS — M6281 Muscle weakness (generalized): Secondary | ICD-10-CM | POA: Diagnosis not present

## 2017-05-01 DIAGNOSIS — I214 Non-ST elevation (NSTEMI) myocardial infarction: Secondary | ICD-10-CM | POA: Diagnosis not present

## 2017-05-01 DIAGNOSIS — M069 Rheumatoid arthritis, unspecified: Secondary | ICD-10-CM | POA: Diagnosis not present

## 2017-05-01 DIAGNOSIS — I255 Ischemic cardiomyopathy: Secondary | ICD-10-CM | POA: Diagnosis not present

## 2017-05-03 NOTE — Progress Notes (Signed)
KRISTOFOR, MICHALOWSKI ([id number]) Visit Report for 04/29/2017 HPI Details Patient Name: [name], [name] Date of Service: 04/29/2017 3:00 PM Medical Record Number: [id number] Patient Account Number: [id number] Date of Birth/Sex: 12/06/1940 (77 y.o. M) Treating RN: Cornell Barman Primary Care Provider: Cyndi Bender Other Clinician: Referring Provider: Cyndi Bender Treating Provider/Extender: Tito Dine in Treatment: 3 History of Present Illness HPI Description: 04/08/17; this is a complex 77 year old man referred here from [facility]. He had been referred there for bilateral lower extremity edema with ulcer formation predominantly on the right calf but also the right [name]. He had been receiving Unna boots bilaterally. The history here is long. He is not a diabetic however ICU looking through late 2018 he was worked up for chronic headaches, elevated inflammatory markers including C-reactive protein and ESR. He went on to actually have a left temporal artery biopsy wasn't that was negative.he received about 6 weeks of high-dose prednisone 60 mg with improvement in his inflammatory markers. He was admitted to hospital in late November with ventricular tachycardia syncope. He has known ischemic cardiomyopathy. He was admitted in the hospital in mid December. Apparently this was precipitated by a syncopal spell falling out of his scooter while at [place]. There was ventricular arrhythmia. He has an implantable defibrillator and echocardiogram showed severe LV dysfunction with an EF of 20% and valvular regurgitations including mild AR, moderate MR. There was no stenosis. He ruled in for a non-ST elevation MI in the setting of V. tach.his wife states that sometime during this hospitalization she noted multiple areas of skin change on the right lower calf which became evident just after he left the hospital. He was back in hospital in February with acute renal failure  hyponatremia. This responded to fluid resuscitation.. Interestingly I can't see much description of his right leg at that point in time.he was followed by Dr. Nehemiah Massed of dermatology for the necrotic wounds on his right leg. Apparently a biopsy was planned at one point but not done although in some notes that suggests it was. I cannot see these results area He was noted to have a lot of edema. Was treated with bilateral Unna boots edges really helped with the swelling they have been using Bactroban to small open areas predominantly on the right anterior lower leg His history is complicated by the fact that he has rheumatoid arthritis followed by rheumatology. He is followed by neurology for disabling headaches. At one point this was felt to be giant cell arteritis although a left temporal biopsy was apparently negative. He was given a prolonged course of prednisone at 60 mg which managed his sedimentation rates but apparently did not prove improve the headaches. This is been tapered to off on by rheumatology on 03/13/17 Vascular had plans to do a venous reflux workup as well as arterial studies in May. They also wanted to get him a lymphedema pump. As mentioned he's been using bacitracin under Unna boot wraps to both lower legs 04/15/17; the patient arrives with most of his wounds improved. These are small punched out wounds. Most of them remaining ones are on the right anterior calf with the most problematic over the right lateral malleolus. There are no new areas. The symptom complex or potential symptom complex we are dealing with his chronic disabling headaches with inflammatory markers not responsive to prednisone and with a negative temporal biopsy, lower extremity weakness, skin ulcerations just on the right leg. We have managed to get his arterial studies  moved up to April 30. He has a rheumatology consult at [facility] in June. He has seen dermatology locally, rheumatology locally, neurology  locally. 04/22/17; small punched out areas on the right leg anteriorly posteriorly. Most of these appear to have closed over. Some of them have eschar over the surface. The most problematic area appears to be over the right lateral malleolus. We've been using prisma to all of this. He has arterial studies on April 30 and a rheumatology consult at [facility] on June 28 04/29/17; most of the small punched out areas on the right leg posteriorly are closed. He has 2 or 3 openings anteriorly but most of these appear to be on the way to closing. Still problematically over the right lateral malleolus and right lateral [name] with almost ischemic-looking eschar. His arterial studies that I ordered are due to be done next week on the 30th so we should Langstaff, An E. ([id number]) have them available for our next visit hopefully. He also saw a rheumatologist at [facility] and according to the patient he did 8 vials of blood. Finally he has scaling rash on his left [name] and what looks to be at [facility] area on the left anterior leg Electronic Signature(s) Signed: 04/29/2017 5:40:08 PM By: Linton Ham MD Entered By: Linton Ham on 04/29/2017 17:21:00 Sutter, [name] ([id number]) -------------------------------------------------------------------------------- Physical Exam Details Patient Name: [name] Date of Service: 04/29/2017 3:00 PM Medical Record Number: [id number] Patient Account Number: [id number] Date of Birth/Sex: [birth year] (77 y.o. M) Treating RN: Cornell Barman Primary Care Provider: Cyndi Bender Other Clinician: Referring Provider: Cyndi Bender Treating Provider/Extender: Tito Dine in Treatment: 3 Constitutional Patient is hypotensive.. Pulse regular and within target range for patient.[name] Respirations regular, non-labored and within target range.. Temperature is normal and within the target range for the patient.[name] appears in no distress. Eyes Conjunctivae clear. No  discharge. Respiratory Respiratory effort is easy and symmetric bilaterally. Rate is normal at rest and on room air.. Cardiovascular Pedal pulses absent bilaterally.. Lymphatic nonpalpable the popliteal area bilaterally. Integumentary (Hair, Skin) other than the right and left legs I don't see any other primary skin issues.[name] Psychiatric No evidence of depression, anxiety, or agitation. Calm, cooperative, and communicative. Appropriate interactions and affect.. Notes wound exam; multiple areas on the right posterior and anterior calf are healed. Most problematic areas are on the right lateral malleolus and the right lateral [name]. There is tightly adherent eschar over the surface of both of these areas, I would like to wait and see what his vascular supply is before I consider debridement. 2 or 3 smaller areas on the right anterior leg are still open. In general all of this looks better oOn the left with a looks like tinea pedis with scaling erythema on the sole of his [name]. Small erythematous area with central clearing on the anterior left leg looks like tinea corpus Electronic Signature(s) Signed: 04/29/2017 5:40:08 PM By: Linton Ham MD Entered By: Linton Ham on 04/29/2017 17:26:09 Harvel, [name] ([id number]) -------------------------------------------------------------------------------- Physician Orders Details Patient Name: [name] Date of Service: 04/29/2017 3:00 PM Medical Record Number: [id number] Patient Account Number: [id number] Date of Birth/Sex: [birth year] (76 y.o. M) Treating RN: Cornell Barman Primary Care Provider: Cyndi Bender Other Clinician: Referring Provider: Cyndi Bender Treating Provider/Extender: Tito Dine in Treatment: 3 Verbal / Phone Orders: No Diagnosis Coding Wound Cleansing Wound #4 Right,Proximal,Lateral Lower Leg o Clean wound with Normal Saline. Wound #5 Right,Distal,Lateral Lower Leg o Clean wound with  Normal  Saline. Wound #6 Right,Lateral Malleolus o Clean wound with Normal Saline. Wound #8 Right,Lateral [name] o Clean wound with Normal Saline. Anesthetic (add to Medication List) Wound #4 Right,Proximal,Lateral Lower Leg o Topical Lidocaine 4% cream applied to wound bed prior to debridement (In Clinic Only). Wound #5 Right,Distal,Lateral Lower Leg o Topical Lidocaine 4% cream applied to wound bed prior to debridement (In Clinic Only). Wound #6 Right,Lateral Malleolus o Topical Lidocaine 4% cream applied to wound bed prior to debridement (In Clinic Only). Wound #8 Right,Lateral [name] o Topical Lidocaine 4% cream applied to wound bed prior to debridement (In Clinic Only). Primary Wound Dressing Wound #4 Right,Proximal,Lateral Lower Leg o Silver Collagen - all wounds Wound #5 Right,Distal,Lateral Lower Leg o Silver Collagen - all wounds Wound #6 Right,Lateral Malleolus o Silver Collagen - all wounds Wound #8 Right,Lateral [name] o Silver Collagen - all wounds Secondary Dressing Wound #4 Right,Proximal,Lateral Lower Leg o ABD pad - over collegen, Unna paste to Anchor at top Wound #5 Right,Distal,Lateral Lower Leg Radoncic, SILVIANO NEUSER ([id number]) o ABD pad - over collegen, Unna paste to Anchor at top Wound #6 Right,Lateral Malleolus o ABD pad - over collegen, Unna paste to Anchor at top Wound #8 Right,Lateral [name] o ABD pad - over collegen, Unna paste to Anchor at top Dressing Change Frequency Wound #4 Right,Proximal,Lateral Lower Leg o Change Dressing Monday, Wednesday, Friday Wound #5 Right,Distal,Lateral Lower Leg o Change Dressing Monday, Wednesday, Friday Wound #6 Right,Lateral Malleolus o Change Dressing Monday, Wednesday, Friday Wound #8 Right,Lateral [name] o Change Dressing Monday, Wednesday, Friday Follow-up Appointments Wound #4 Right,Proximal,Lateral Lower Leg o Return Appointment in 1 week. Wound #5 Right,Distal,Lateral Lower  Leg o Return Appointment in 1 week. Wound #6 Right,Lateral Malleolus o Return Appointment in 1 week. Wound #8 Right,Lateral [name] o Return Appointment in 1 week. Edema Control Wound #4 Right,Proximal,Lateral Lower Leg o 3 Layer Compression System - Right Lower Extremity Wound #5 Right,Distal,Lateral Lower Leg o 3 Layer Compression System - Right Lower Extremity Wound #6 Right,Lateral Malleolus o 3 Layer Compression System - Right Lower Extremity Wound #8 Right,Lateral [name] o 3 Layer Compression System - Right Lower Extremity Home Health Wound #4 Right,Proximal,Lateral Lower Leg o Continue Home Health Visits o Home Health Nurse may visit PRN to address patientos wound care needs. o FACE TO FACE ENCOUNTER: MEDICARE and MEDICAID PATIENTS: I certify that this patient is under my care and that I had a face-to-face encounter that meets the physician face-to-face encounter requirements with this patient on this date. The encounter with the patient was in whole or in part for the following MEDICAL CONDITION: (primary reason for [place]) MEDICAL NECESSITY: I certify, that based on my findings, NURSING services are a medically necessary home health service. HOME BOUND STATUS: I certify that my Karl, TREMAR WICKENS ([id number]) clinical findings support that this patient is homebound (i.e., Due to illness or injury, pt requires aid of supportive devices such as crutches, cane, wheelchairs, walkers, the use of special transportation or the assistance of another person to leave their place of residence. There is a normal inability to leave the home and doing so requires considerable and taxing effort. Other absences are for medical reasons / religious services and are infrequent or of short duration when for other reasons). o If current dressing causes regression in wound condition, may D/C ordered dressing product/s and apply Normal Saline Moist Dressing daily until next  [place] / Other MD appointment. [place] of regression in wound condition  at [phone]. o Please direct any NON-WOUND related issues/requests for orders to patient's Primary Care Physician Wound #5 Right,Distal,Lateral Lower Leg o [place] Nurse may visit PRN to address patientos wound care needs. o FACE TO FACE ENCOUNTER: MEDICARE and MEDICAID PATIENTS: I certify that this patient is under my care and that I had a face-to-face encounter that meets the physician face-to-face encounter requirements with this patient on this date. The encounter with the patient was in whole or in part for the following MEDICAL CONDITION: (primary reason for [place]) MEDICAL NECESSITY: I certify, that based on my findings, NURSING services are a medically necessary home health service. HOME BOUND STATUS: I certify that my clinical findings support that this patient is homebound (i.e., Due to illness or injury, pt requires aid of supportive devices such as crutches, cane, wheelchairs, walkers, the use of special transportation or the assistance of another person to leave their place of residence. There is a normal inability to leave the home and doing so requires considerable and taxing effort. Other absences are for medical reasons / religious services and are infrequent or of short duration when for other reasons). o If current dressing causes regression in wound condition, may D/C ordered dressing product/s and apply Normal Saline Moist Dressing daily until next [place] / Other MD appointment. [place] of regression in wound condition at [phone]. o Please direct any NON-WOUND related issues/requests for orders to patient's Primary Care Physician Wound #6 [place] Nurse may visit PRN to address patientos wound care needs. o  FACE TO FACE ENCOUNTER: MEDICARE and MEDICAID PATIENTS: I certify that this patient is under my care and that I had a face-to-face encounter that meets the physician face-to-face encounter requirements with this patient on this date. The encounter with the patient was in whole or in part for the following MEDICAL CONDITION: (primary reason for [place]) MEDICAL NECESSITY: I certify, that based on my findings, NURSING services are a medically necessary home health service. HOME BOUND STATUS: I certify that my clinical findings support that this patient is homebound (i.e., Due to illness or injury, pt requires aid of supportive devices such as crutches, cane, wheelchairs, walkers, the use of special transportation or the assistance of another person to leave their place of residence. There is a normal inability to leave the home and doing so requires considerable and taxing effort. Other absences are for medical reasons / religious services and are infrequent or of short duration when for other reasons). o If current dressing causes regression in wound condition, may D/C ordered dressing product/s and apply Normal Saline Moist Dressing daily until next [place] / Other MD appointment. [place] of regression in wound condition at [phone]. o Please direct any NON-WOUND related issues/requests for orders to patient's Primary Care Physician Wound #8 [place] Nurse may visit PRN to address patientos wound care needs. o FACE TO FACE ENCOUNTER: MEDICARE and MEDICAID PATIENTS: I certify that this patient is under my care and that I had a face-to-face encounter that meets the physician face-to-face encounter requirements with this patient on this date. The encounter with the patient was in whole or in part for the following MEDICAL CONDITION: (primary reason for [place]) MEDICAL NECESSITY: I  certify, that based on my findings, NURSING services are a medically necessary home health service.  HOME BOUND STATUS: I certify that my clinical findings support that this patient is homebound (i.e., Due to illness or injury, pt requires aid of supportive devices such as crutches, cane, wheelchairs, walkers, the use of special transportation or the assistance of another person to leave their place of residence. There is a normal inability to leave the home and doing so requires considerable and taxing effort. Other absences are for medical reasons / religious services and are infrequent or of short duration when for other reasons). KENNER, LEWAN ([id number]) o If current dressing causes regression in wound condition, may D/C ordered dressing product/s and apply Normal Saline Moist Dressing daily until next [place] / Other MD appointment. [place] of regression in wound condition at [phone]. o Please direct any NON-WOUND related issues/requests for orders to patient's Primary Care Physician Notes Cream prescribed for rash Left lower leg Electronic Signature(s) Signed: 04/29/2017 5:40:08 PM By: Linton Ham MD Signed: 04/29/2017 8:51:29 PM By: Gretta Cool, BSN, RN, CWS, Kim RN, BSN Entered By: Gretta Cool, BSN, RN, CWS, Kim on 04/29/2017 16:05:43 [place], [name] ([id number]) -------------------------------------------------------------------------------- Problem List Details Patient Name: [name] Date of Service: 04/29/2017 3:00 PM Medical Record Number: [id number] Patient Account Number: [id number] Date of Birth/Sex: 1940-02-14 (76 y.o. M) Treating RN: Cornell Barman Primary Care Provider: Cyndi Bender Other Clinician: Referring Provider: Cyndi Bender Treating Provider/Extender: Tito Dine in Treatment: 3 Active Problems ICD-10 Impacting Encounter Code Description Active Date Wound Healing Diagnosis L97.521 Non-pressure  chronic ulcer of other part of left [name] limited to 04/08/2017 Yes breakdown of skin L97.211 Non-pressure chronic ulcer of right calf limited to breakdown 04/08/2017 Yes of skin L97.511 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 limited to 04/08/2017 Yes breakdown of skin I25.5 Ischemic cardiomyopathy 04/08/2017 Yes Inactive Problems Resolved Problems Electronic Signature(s) Signed: 04/29/2017 5:40:08 PM By: Linton Ham MD Entered By: Linton Ham on 04/29/2017 17:13:44 [place], [name] ([id number]) -------------------------------------------------------------------------------- Progress Note Details Patient Name: [name] Date of Service: 04/29/2017 3:00 PM Medical Record Number: [id number] Patient Account Number: [id number] Date of Birth/Sex: [birth year] (76 y.o. M) Treating RN: Cornell Barman Primary Care Provider: Cyndi Bender Other Clinician: Referring Provider: Cyndi Bender Treating Provider/Extender: Tito Dine in Treatment: 3 Subjective History of Present Illness (HPI) 04/08/17; this is a complex 77 year old man referred here from [facility]. He had been referred there for bilateral lower extremity edema with ulcer formation predominantly on the right calf but also the right [name]. He had been receiving Unna boots bilaterally. The history here is long. He is not a diabetic however ICU looking through late 2018 he was worked up for chronic headaches, elevated inflammatory markers including C-reactive protein and ESR. He went on to actually have a left temporal artery biopsy wasn't that was negative.he received about 6 weeks of high-dose prednisone 60 mg with improvement in his inflammatory markers. He was admitted to hospital in late November with ventricular tachycardia syncope. He has known ischemic cardiomyopathy. He was admitted in the hospital in mid December. Apparently this was precipitated by a syncopal spell falling out of  his scooter while at [place]. There was ventricular arrhythmia. He has an implantable defibrillator and echocardiogram showed severe LV dysfunction with an EF of 20% and valvular regurgitations including mild AR, moderate MR. There was no stenosis. He ruled in for a non-ST elevation MI in the setting of V. tach.his wife states that sometime during this hospitalization she noted multiple areas  of skin change on the right lower calf which became evident just after he left the hospital. He was back in hospital in February with acute renal failure hyponatremia. This responded to fluid resuscitation.. Interestingly I can't see much description of his right leg at that point in time.he was followed by Dr. Nehemiah Massed of dermatology for the necrotic wounds on his right leg. Apparently a biopsy was planned at one point but not done although in some notes that suggests it was. I cannot see these results area He was noted to have a lot of edema. Was treated with bilateral Unna boots edges really helped with the swelling they have been using Bactroban to small open areas predominantly on the right anterior lower leg His history is complicated by the fact that he has rheumatoid arthritis followed by rheumatology. He is followed by neurology for disabling headaches. At one point this was felt to be giant cell arteritis although a left temporal biopsy was apparently negative. He was given a prolonged course of prednisone at 60 mg which managed his sedimentation rates but apparently did not prove improve the headaches. This is been tapered to off on by rheumatology on 03/13/17 Vascular had plans to do a venous reflux workup as well as arterial studies in May. They also wanted to get him a lymphedema pump. As mentioned he's been using bacitracin under Unna boot wraps to both lower legs 04/15/17; the patient arrives with most of his wounds improved. These are small punched out wounds. Most of them remaining ones are on  the right anterior calf with the most problematic over the right lateral malleolus. There are no new areas. The symptom complex or potential symptom complex we are dealing with his chronic disabling headaches with inflammatory markers not responsive to prednisone and with a negative temporal biopsy, lower extremity weakness, skin ulcerations just on the right leg. We have managed to get his arterial studies moved up to April 30. He has a rheumatology consult at [facility] in June. He has seen dermatology locally, rheumatology locally, neurology locally. 04/22/17; small punched out areas on the right leg anteriorly posteriorly. Most of these appear to have closed over. Some of them have eschar over the surface. The most problematic area appears to be over the right lateral malleolus. We've been using prisma to all of this. He has arterial studies on April 30 and a rheumatology consult at [facility] on June 28 04/29/17; most of the small punched out areas on the right leg posteriorly are closed. He has 2 or 3 openings anteriorly but most of these appear to be on the way to closing. Still problematically over the right lateral malleolus and right lateral [name] with almost ischemic-looking eschar. His arterial studies that I ordered are due to be done next week on the 30th so we should have them available for our next visit hopefully. He also saw a rheumatologist at [facility] and according to the patient he did 8 vials of blood. Finally he has scaling rash on his left [name] and what looks to be at [facility] area on the left anterior leg Badilla, Lymon E. ([id number]) Objective Constitutional Patient is hypotensive.. Pulse regular and within target range for patient.[name] Respirations regular, non-labored and within target range.. Temperature is normal and within the target range for the patient.[name] appears in no distress. Vitals Time Taken: 3:15 PM, Height: 68 in, Temperature: 97.8 F, Pulse: 62 bpm, Respiratory Rate: 18  breaths/min, Blood Pressure: 93/57 mmHg. Eyes Conjunctivae clear. No discharge. Respiratory  Respiratory effort is easy and symmetric bilaterally. Rate is normal at rest and on room air.. Cardiovascular Pedal pulses absent bilaterally.. Lymphatic nonpalpable the popliteal area bilaterally. Psychiatric No evidence of depression, anxiety, or agitation. Calm, cooperative, and communicative. Appropriate interactions and affect.. General Notes: wound exam; multiple areas on the right posterior and anterior calf are healed. Most problematic areas are on the right lateral malleolus and the right lateral [name]. There is tightly adherent eschar over the surface of both of these areas, I would like to wait and see what his vascular supply is before I consider debridement. 2 or 3 smaller areas on the right anterior leg are still open. In general all of this looks better On the left with a looks like tinea pedis with scaling erythema on the sole of his [name]. Small erythematous area with central clearing on the anterior left leg looks like tinea corpus Integumentary (Hair, Skin) other than the right and left legs I don't see any other primary skin issues.. Wound #4 status is Open. Original cause of wound was Gradually Appeared. The wound is located on the Right,Proximal,Lateral Lower Leg. The wound measures 0.4cm length x 0.3cm width x 0.1cm depth; 0.094cm^2 area and 0.009cm^3 volume. Wound #5 status is Open. Original cause of wound was Gradually Appeared. The wound is located on the Right,Distal,Lateral Lower Leg. The wound measures 0.3cm length x 0.33cm width x 0.1cm depth; 0.078cm^2 area and 0.008cm^3 volume. Wound #6 status is Open. Original cause of wound was Gradually Appeared. The wound is located on the Right,Lateral Malleolus. The wound measures 0.5cm length x 0.6cm width x 0.2cm depth; 0.236cm^2 area and 0.047cm^3 volume. Wound #8 status is Open. Original cause of wound was Gradually Appeared.  The wound is located on the Right,Lateral [name]. The wound measures 0.3cm length x 0.3cm width x 0.1cm depth; 0.071cm^2 area and 0.007cm^3 volume. There is no tunneling or undermining noted. There is a none present amount of drainage noted. The wound margin is flat and intact. There is no granulation within the wound bed. There is a large (67-100%) amount of necrotic tissue within the wound bed including Eschar. The periwound skin appearance did not exhibit: Callus, Crepitus, Excoriation, Induration, Rash, Scarring, Dry/Scaly, Maceration, Atrophie Blanche, Cyanosis, Ecchymosis, Hemosiderin Staining, Mottled, Pallor, Rubor, Erythema. BRANSON, KRANZ ([id number]) Periwound temperature was noted as No Abnormality. 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 There was a Three Layer Compression Therapy Procedure by Cornell Barman, RN. Post procedure Diagnosis Wound #4: Same as Pre-Procedure Notes: Patient tolerating three layer wrap. Plan Wound Cleansing: Wound #4 Right,Proximal,Lateral Lower Leg: Clean wound with Normal Saline. Wound #5 Right,Distal,Lateral Lower Leg: Clean wound with Normal Saline. Wound #6 Right,Lateral Malleolus: Clean wound with Normal Saline. Wound #8 Right,Lateral [name]: Clean wound with Normal Saline. Anesthetic (add to Medication List): Wound #4 Right,Proximal,Lateral Lower Leg: Topical Lidocaine 4% cream applied to wound bed prior to debridement (In Clinic Only). Wound #5 Right,Distal,Lateral Lower Leg: Topical Lidocaine 4% cream applied to  wound bed prior to debridement (In Clinic Only). Wound #6 Right,Lateral Malleolus: Topical Lidocaine 4% cream applied to wound bed prior to debridement (  In Clinic Only). Wound #8 Right,Lateral [name]: Topical Lidocaine 4% cream applied to wound bed prior to debridement (In Clinic Only). Primary Wound Dressing: Wound #4 Right,Proximal,Lateral Lower Leg: Silver Collagen - all wounds Wound #5 Right,Distal,Lateral Lower Leg: Silver Collagen - all wounds Wound #6 Right,Lateral Malleolus: Boursiquot, TREYVIN GLIDDEN ([id number]) Silver Collagen - all wounds Wound #8 Right,Lateral [name]: Silver Collagen - all wounds Secondary Dressing: Wound #4 Right,Proximal,Lateral Lower Leg: ABD pad - over collegen, Unna paste to Anchor at top Wound #5 Right,Distal,Lateral Lower Leg: ABD pad - over collegen, Unna paste to Anchor at top Wound #6 Right,Lateral Malleolus: ABD pad - over collegen, Unna paste to Anchor at top Wound #8 Right,Lateral [name]: ABD pad - over collegen, Unna paste to Anchor at top Dressing Change Frequency: Wound #4 Right,Proximal,Lateral Lower Leg: Change Dressing Monday, Wednesday, Friday Wound #5 Right,Distal,Lateral Lower Leg: Change Dressing Monday, Wednesday, Friday Wound #6 Right,Lateral Malleolus: Change Dressing Monday, Wednesday, Friday Wound #8 Right,Lateral [name]: Change Dressing Monday, Wednesday, Friday Follow-up Appointments: Wound #4 Right,Proximal,Lateral Lower Leg: Return Appointment in 1 week. Wound #5 Right,Distal,Lateral Lower Leg: Return Appointment in 1 week. Wound #6 Right,Lateral Malleolus: Return Appointment in 1 week. Wound #8 Right,Lateral [name]: Return Appointment in 1 week. Edema Control: Wound #4 Right,Proximal,Lateral Lower Leg: 3 Layer Compression System - Right Lower Extremity Wound #5 Right,Distal,Lateral Lower Leg: 3 Layer Compression System - Right Lower Extremity Wound #6 Right,Lateral Malleolus: 3 Layer Compression System - Right Lower  Extremity Wound #8 Right,Lateral [name]: 3 Layer Compression System - Right Lower Extremity Home Health: Wound #4 Right,Proximal,Lateral Lower Leg: [place] Nurse may visit PRN to address patient s wound care needs. FACE TO FACE ENCOUNTER: MEDICARE and MEDICAID PATIENTS: I certify that this patient is under my care and that I had a face-to-face encounter that meets the physician face-to-face encounter requirements with this patient on this date. The encounter with the patient was in whole or in part for the following MEDICAL CONDITION: (primary reason for [place]) MEDICAL NECESSITY: I certify, that based on my findings, NURSING services are a medically necessary home health service. HOME BOUND STATUS: I certify that my clinical findings support that this patient is homebound (i.e., Due to illness or injury, pt requires aid of supportive devices such as crutches, cane, wheelchairs, walkers, the use of special transportation or the assistance of another person to leave their place of residence. There is a normal inability to leave the home and doing so requires considerable and taxing effort. Other absences are for medical reasons / religious services and are infrequent or of short duration when for other reasons). If current dressing causes regression in wound condition, may D/C ordered dressing product/s and apply Normal Saline Moist Dressing daily until next [place] / Other MD appointment. [place] of regression in wound condition at [phone]. Please direct any NON-WOUND related issues/requests for orders to patient's Primary Care Physician Wound #5 Right,Distal,Lateral Lower Leg: [place] Nurse may visit PRN to address patient s wound care needs. FACE TO FACE ENCOUNTER: MEDICARE and MEDICAID PATIENTS: I certify that this patient is under my care and that I Mcclune, Caiden E.  ([id number]) had a face-to-face encounter that meets the physician face-to-face encounter requirements with this patient on this date. The encounter with the patient was in whole or in part for the following MEDICAL CONDITION: (primary reason for [place]) MEDICAL NECESSITY: I certify, that based on my findings, NURSING services  are a medically necessary home health service. HOME BOUND STATUS: I certify that my clinical findings support that this patient is homebound (i.e., Due to illness or injury, pt requires aid of supportive devices such as crutches, cane, wheelchairs, walkers, the use of special transportation or the assistance of another person to leave their place of residence. There is a normal inability to leave the home and doing so requires considerable and taxing effort. Other absences are for medical reasons / religious services and are infrequent or of short duration when for other reasons). If current dressing causes regression in wound condition, may D/C ordered dressing product/s and apply Normal Saline Moist Dressing daily until next [place] / Other MD appointment. [place] of regression in wound condition at [phone]. Please direct any NON-WOUND related issues/requests for orders to patient's Primary Care Physician Wound #6 Right,Lateral Malleolus: [place] Nurse may visit PRN to address patient s wound care needs. FACE TO FACE ENCOUNTER: MEDICARE and MEDICAID PATIENTS: I certify that this patient is under my care and that I had a face-to-face encounter that meets the physician face-to-face encounter requirements with this patient on this date. The encounter with the patient was in whole or in part for the following MEDICAL CONDITION: (primary reason for [place]) MEDICAL NECESSITY: I certify, that based on my findings, NURSING services are a medically necessary home health service. HOME BOUND  STATUS: I certify that my clinical findings support that this patient is homebound (i.e., Due to illness or injury, pt requires aid of supportive devices such as crutches, cane, wheelchairs, walkers, the use of special transportation or the assistance of another person to leave their place of residence. There is a normal inability to leave the home and doing so requires considerable and taxing effort. Other absences are for medical reasons / religious services and are infrequent or of short duration when for other reasons). If current dressing causes regression in wound condition, may D/C ordered dressing product/s and apply Normal Saline Moist Dressing daily until next [place] / Other MD appointment. [place] of regression in wound condition at [phone]. Please direct any NON-WOUND related issues/requests for orders to patient's Primary Care Physician Wound #8 Right,Lateral [name]: [place] Nurse may visit PRN to address patient s wound care needs. FACE TO FACE ENCOUNTER: MEDICARE and MEDICAID PATIENTS: I certify that this patient is under my care and that I had a face-to-face encounter that meets the physician face-to-face encounter requirements with this patient on this date. The encounter with the patient was in whole or in part for the following MEDICAL CONDITION: (primary reason for [place]) MEDICAL NECESSITY: I certify, that based on my findings, NURSING services are a medically necessary home health service. HOME BOUND STATUS: I certify that my clinical findings support that this patient is homebound (i.e., Due to illness or injury, pt requires aid of supportive devices such as crutches, cane, wheelchairs, walkers, the use of special transportation or the assistance of another person to leave their place of residence. There is a normal inability to leave the home and doing so requires considerable and taxing effort.  Other absences are for medical reasons / religious services and are infrequent or of short duration when for other reasons). If current dressing causes regression in wound condition, may D/C ordered dressing product/s and apply Normal Saline Moist Dressing daily until next [place] / Other MD appointment. [facility] of [facility] in wound condition at [phone]. Please direct any NON-WOUND related issues/requests for orders to patient's Primary Care Physician General Notes: Cream prescribed for rash Left lower leg #1continuous over collagen to all wounds/ABDs/3-lead compression #2 I still want to know about his macrovascular status and he is going for those studies on April 30 #3 he was able to move up his academic rheumatology consult to do 2 this morning. We'll look forward to information we might gain about the overall inflammatory status vis--vis his symptom complex 4 I recommended terbinafine cream 1% twice daily to the left [name] and once daily to the left anterior leg area ASMAR, BROZEK ([id number]) Electronic Signature(s) Signed: 04/29/2017 5:40:08 PM By: Linton Ham MD Entered By: Linton Ham on 04/29/2017 17:28:55 Weingarten, [name] ([id number]) -------------------------------------------------------------------------------- SuperBill Details Patient Name: [name] Date of Service: 04/29/2017 Medical Record Number: [id number] Patient Account Number: [id number] Date of Birth/Sex: [birth year] (76 y.o. 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 L97.511 Non-pressure chronic  ulcer of other part of right [name] limited to breakdown of skin I25.5 Ischemic cardiomyopathy Facility Procedures CPT4 Code Description: [id number] (Facility Use Only) [phone] - APPLY MULTLAY COMPRS LWR RT LEG Modifier: Quantity: 1 Physician Procedures CPT4 Code Description: [id number] - WC PHYS LEVEL 3 - EST PT ICD-10 Diagnosis Description L97.521 Non-pressure chronic ulcer of other part of left [name] limited t L97.211 Non-pressure chronic ulcer of right calf limited to breakdown o L97.511  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 limited Modifier: o breakdown of sk f skin to breakdown of s Quantity: 1 in kin Electronic Signature(s) Signed: 04/29/2017 5:40:08 PM By: Linton Ham MD Previous Signature: 04/29/2017 4:57:35 PM Version By: Gretta Cool, BSN, RN, CWS, Kim RN, BSN Entered By: Linton Ham on 04/29/2017 17:28:26

## 2017-05-03 NOTE — Progress Notes (Signed)
TAYSEAN, WAGER ([id number]) Visit Report for 04/29/2017 Arrival Information Details Patient Name: [name], MCGUE Date of Service: 04/29/2017 3:00 PM Medical Record Number: [id number] Patient Account Number: [id number] Date of Birth/Sex: 09/29/1940 (76 y.o. M) Treating RN: Roger Shelter Primary Care Koreen Lizaola: Cyndi Bender Other Clinician: Referring Kimo Bancroft: Cyndi Bender Treating Tanecia Mccay/Extender: Tito Dine in Treatment: 3 Visit Information History Since Last Visit All ordered tests and consults were completed: No Patient Arrived: Walker Added or deleted any medications: No Arrival Time: 15:15 Any new allergies or adverse reactions: No Accompanied By: spouse Had a fall or experienced change in No Transfer Assistance: None activities of daily living that may affect Patient Identification Verified: Yes risk of falls: Secondary Verification Process Completed: Yes Signs or symptoms of abuse/neglect since last visito No Patient Requires Transmission-Based No Hospitalized since last visit: No Precautions: Implantable device outside of the clinic excluding No Patient Has Alerts: Yes cellular tissue based products placed in the center Patient Alerts: R ABI since last visit: >220 Pain Present Now: No Electronic Signature(s) Signed: 04/29/2017 4:28:16 PM By: Roger Shelter Entered By: Roger Shelter on 04/29/2017 15:15:52 Gray, [name] ([id number]) -------------------------------------------------------------------------------- Compression Therapy Details Patient Name: [name] Date of Service: 04/29/2017 3:00 PM Medical Record Number: [id number] Patient Account Number: [id number] Date of Birth/Sex: 11/25/1940 (76 y.o. M) Treating RN: Cornell Barman Primary Care Kenslie Abbruzzese: Cyndi Bender Other Clinician: Referring Jodine Muchmore: Cyndi Bender Treating Shawnta Schlegel/Extender: Tito Dine in Treatment: 3 Compression Therapy Performed for  Wound Assessment: Wound #4 Right,Proximal,Lateral Lower Leg Performed By: Clinician Cornell Barman, RN Compression Type: Three Layer Post Procedure Diagnosis Same as Pre-procedure Notes Patient tolerating three layer wrap Electronic Signature(s) Signed: 04/29/2017 8:51:29 PM By: Gretta Cool, BSN, RN, CWS, Kim RN, BSN Entered By: Gretta Cool, BSN, RN, CWS, Kim on 04/29/2017 16:02:33 Oettinger, [name] ([id number]) -------------------------------------------------------------------------------- Encounter Discharge Information Details Patient Name: [name] Date of Service: 04/29/2017 3:00 PM Medical Record Number: [id number] Patient Account Number: [id number] Date of Birth/Sex: 1940/10/30 (76 y.o. M) Treating RN: Cornell Barman Primary Care Malayasia Mirkin: Cyndi Bender Other Clinician: Referring Elie Leppo: Cyndi Bender Treating Granite Godman/Extender: Tito Dine in Treatment: 3 Encounter Discharge Information Items Discharge Pain Level: 0 Discharge Condition: Stable Ambulatory Status: Walker Discharge Destination: Home Transportation: Private Auto Accompanied By: spouse Schedule Follow-up Appointment: Yes Medication Reconciliation completed and No provided to Patient/Care Han Lysne: Provided on Clinical Summary of Care: 04/29/2017 Form Type Recipient Paper Patient LM Electronic Signature(s) Signed: 04/29/2017 4:57:40 PM By: Montey Hora Entered By: Montey Hora on 04/29/2017 16:57:40 Anastas, [name] ([id number]) -------------------------------------------------------------------------------- Lower Extremity Assessment Details Patient Name: [name] Date of Service: 04/29/2017 3:00 PM Medical Record Number: [id number] Patient Account Number: [id number] Date of Birth/Sex: May 04, 1940 (76 y.o. M) Treating RN: Roger Shelter Primary Care Cristabel Bicknell: Cyndi Bender Other Clinician: Referring Aamari Strawderman: Cyndi Bender Treating Daisa Stennis/Extender: Tito Dine in  Treatment: 3 Edema Assessment Assessed: [Left: No] [Right: No] Edema: [Left: Yes] [Right: No] Calf Left: Right: Point of Measurement: 27 cm From Medial Instep 30.5 cm 29 cm Ankle Left: Right: Point of Measurement: 11 cm From Medial Instep 24.2 cm 21.5 cm Vascular Assessment Claudication: Claudication Assessment [Left:None] [Right:None] Pulses: Dorsalis Pedis Palpable: [Left:Yes] [Right:Yes] Posterior Tibial Extremity colors, hair growth, and conditions: Extremity Color: [Left:Normal] [Right:Hyperpigmented] Hair Growth on Extremity: [Left:Yes] [Right:Yes] Temperature of Extremity: [Left:Warm] [Right:Warm] Capillary Refill: [Left:< 3 seconds] [Right:< 3 seconds] Toe Nail Assessment Left: Right: Thick: Yes Yes Discolored: Yes Yes Deformed: Yes Yes Improper Length and Hygiene: Yes Yes  Electronic Signature(s) Signed: 04/29/2017 4:28:16 PM By: Roger Shelter Entered By: Roger Shelter on 04/29/2017 15:33:34 Howatt, [name] ([id number]) -------------------------------------------------------------------------------- Multi Wound Chart Details Patient Name: [name] Date of Service: 04/29/2017 3:00 PM Medical Record Number: [id number] Patient Account Number: [id number] Date of Birth/Sex: [birth year] (76 y.o. M) Treating RN: Cornell Barman Primary Care Yannet Rincon: Cyndi Bender Other Clinician: Referring Chelsia Serres: Cyndi Bender Treating Ariya Bohannon/Extender: Tito Dine in Treatment: 3 Vital Signs Height(in): 90 Pulse(bpm): 72 Weight(lbs): Blood Pressure(mmHg): 93/57 Body Mass Index(BMI): Temperature(F): 97.8 Respiratory Rate 18 (breaths/min): Photos: Wound Location: Right, Proximal, Lateral Lower Right, Distal, Lateral Lower Right, Lateral Malleolus Leg Leg Wounding Event: Gradually Appeared Gradually Appeared Gradually Appeared Primary Etiology: Arterial Insufficiency Ulcer Arterial Insufficiency Ulcer Arterial Insufficiency Ulcer Comorbid History:  N/A N/A N/A Date Acquired: 04/08/2017 01/15/2017 03/23/2017 Weeks of Treatment: 3 3 3  Wound Status: Open Open Open Measurements L x W x D 0.4x0.3x0.1 0.3x0.33x0.1 0.5x0.6x0.2 (cm) Area (cm) : 0.094 0.078 0.236 Volume (cm) : 0.009 0.008 0.047 % Reduction in Area: 52.00% 66.90% -661.30% % Reduction in Volume: 84.70% 83.00% -1466.70% Classification: Full Thickness Without Full Thickness Without Full Thickness Without Exposed Support Structures Exposed Support Structures Exposed Support Structures Exudate Amount: N/A N/A N/A Wound Margin: N/A N/A N/A Granulation Amount: N/A N/A N/A Necrotic Amount: N/A N/A N/A Necrotic Tissue: N/A N/A N/A Epithelialization: N/A N/A N/A Periwound Skin Texture: No Abnormalities Noted No Abnormalities Noted No Abnormalities Noted Periwound Skin Moisture: No Abnormalities Noted No Abnormalities Noted No Abnormalities Noted Periwound Skin Color: No Abnormalities Noted No Abnormalities Noted No Abnormalities Noted Temperature: N/A N/A N/A Tenderness on Palpation: No No No Wound Preparation: N/A N/A N/A Procedures Performed: Compression Therapy N/A N/A HERBERTO, LEDWELL ([id number]) Wound Number: 8 N/A N/A Photos: N/A N/A Wound Location: Right [name] - Lateral N/A N/A Wounding Event: Gradually Appeared N/A N/A Primary Etiology: To be determined N/A N/A Comorbid History: Congestive Heart Failure, N/A N/A Coronary Artery Disease, Hypertension, Myocardial Infarction, Osteoarthritis Date Acquired: 04/29/2017 N/A N/A Weeks of Treatment: 0 N/A N/A Wound Status: Open N/A N/A Measurements L x W x D 0.3x0.3x0.1 N/A N/A (cm) Area (cm) : 0.071 N/A N/A Volume (cm) : 0.007 N/A N/A % Reduction in Area: N/A N/A N/A % Reduction in Volume: N/A N/A N/A Classification: Partial Thickness N/A N/A Exudate Amount: None Present N/A N/A Wound Margin: Flat and Intact N/A N/A Granulation Amount: None Present (0%) N/A N/A Necrotic Amount: Large (67-100%) N/A N/A Necrotic  Tissue: Eschar N/A N/A Exposed Structures: Fascia: No N/A N/A Fat Layer (Subcutaneous Tissue) Exposed: No Tendon: No Muscle: No Joint: No Bone: No Epithelialization: Large (67-100%) N/A N/A Periwound Skin Texture: Excoriation: No N/A N/A Induration: No Callus: No Crepitus: No Rash: No Scarring: No Periwound Skin Moisture: Maceration: No N/A N/A Dry/Scaly: No Periwound Skin Color: Atrophie Blanche: No N/A N/A Cyanosis: No Ecchymosis: No Erythema: No Hemosiderin Staining: No Mottled: No Pallor: No Rubor: No Temperature: No Abnormality N/A N/A Beyl, Erlin E. ([id number]) Tenderness on Palpation: No N/A N/A Wound Preparation: Ulcer Cleansing: N/A N/A Rinsed/Irrigated with Saline Topical Anesthetic Applied: Other: lidocaine 4% Procedures Performed: N/A N/A N/A Treatment Notes Wound #4 (Right, Proximal, Lateral Lower Leg) 1. Cleansed with: Clean wound with Normal Saline 2. Anesthetic Topical Lidocaine 4% cream to wound bed prior to debridement 4. Dressing Applied: Prisma Ag 5. Secondary Dressing Applied ABD Pad 7. Secured with 3 Layer Compression System - Right Lower Extremity Wound #5 (Right, Distal, Lateral Lower Leg) 1. Cleansed with: Clean wound  with Normal Saline 2. Anesthetic Topical Lidocaine 4% cream to wound bed prior to debridement 4. Dressing Applied: Prisma Ag 5. Secondary Dressing Applied ABD Pad 7. Secured with 3 Layer Compression System - Right Lower Extremity Wound #6 (Right, Lateral Malleolus) 1. Cleansed with: Clean wound with Normal Saline 2. Anesthetic Topical Lidocaine 4% cream to wound bed prior to debridement 4. Dressing Applied: Prisma Ag 5. Secondary Dressing Applied ABD Pad 7. Secured with 3 Layer Compression System - Right Lower Extremity Wound #8 (Right, Lateral [name]) 1. Cleansed with: Clean wound with Normal Saline 2. Anesthetic Topical Lidocaine 4% cream to wound bed prior to debridement 4. Dressing  Applied: Prisma Ag CHAN, SHEAHAN. ([id number]) 5. Secondary Dressing Applied ABD Pad 7. Secured with 3 Layer Compression System - Right Lower Extremity Electronic Signature(s) Signed: 04/29/2017 5:40:08 PM By: Linton Ham MD Entered By: Linton Ham on 04/29/2017 17:13:51 Hollenbach, [name] ([id number]) -------------------------------------------------------------------------------- Multi-Disciplinary Care Plan Details Patient Name: [name] Date of Service: 04/29/2017 3:00 PM Medical Record Number: [id number] Patient Account Number: [id number] Date of Birth/Sex: 08/04/1940 (76 y.o. M) Treating RN: Cornell Barman Primary Care Yael Coppess: Cyndi Bender Other Clinician: Referring Briceson Broadwater: Cyndi Bender Treating Inetha Maret/Extender: Tito Dine in Treatment: 3 Active Inactive ` Abuse / Safety / Falls / Self Care Management Nursing Diagnoses: History of Falls Goals: Patient will remain injury free related to falls Date Initiated: 04/08/2017 Target Resolution Date: 05/08/2017 Goal Status: Active Interventions: Assess fall risk on admission and as needed Notes: ` Orientation to the Wound Care Program Nursing Diagnoses: Knowledge deficit related to the wound healing center program Goals: Patient/caregiver will verbalize understanding of the [place] Program Date Initiated: 04/08/2017 Target Resolution Date: 05/08/2017 Goal Status: Active Interventions: Provide education on orientation to the wound center Notes: ` Soft Tissue Infection Nursing Diagnoses: Impaired tissue integrity Potential for infection: soft tissue Goals: Patient will remain free of wound infection Date Initiated: 04/08/2017 Target Resolution Date: 05/08/2017 Goal Status: Active TOMOKI, LUCKEN ([id number]) Interventions: Assess signs and symptoms of infection every visit Notes: ` Wound/Skin Impairment Nursing Diagnoses: Impaired tissue integrity Goals: Ulcer/skin  breakdown will heal within 14 weeks Date Initiated: 04/08/2017 Target Resolution Date: 07/20/2017 Goal Status: Active Interventions: Assess patient/caregiver ability to perform ulcer/skin care regimen upon admission and as needed Provide education on ulcer and skin care Treatment Activities: Topical wound management initiated : 04/08/2017 Notes: Electronic Signature(s) Signed: 04/29/2017 8:51:29 PM By: Gretta Cool, BSN, RN, CWS, Kim RN, BSN Entered By: Gretta Cool, BSN, RN, CWS, Kim on 04/29/2017 16:01:08 Stamos, [name] ([id number]) -------------------------------------------------------------------------------- Non-Wound Condition Assessment Details Patient Name: [name] Date of Service: 04/29/2017 3:00 PM Medical Record Number: [id number] Patient Account Number: [id number] Date of Birth/Sex: [birth year] (76 y.o. M) Treating RN: Cornell Barman Primary Care Diavian Furgason: Cyndi Bender Other Clinician: Referring Narayan Scull: Cyndi Bender Treating Rasaan Brotherton/Extender: Tito Dine in Treatment: 3 Non-Wound Condition: Condition: Other Dermatologic Condition Location: Leg Side: Left Periwound Skin Texture Texture Color No Abnormalities Noted: No No Abnormalities Noted: No Rash: Yes Moisture No Abnormalities Noted: No Dry / Scaly: Yes Electronic Signature(s) Signed: 04/29/2017 8:51:29 PM By: Gretta Cool, BSN, RN, CWS, Kim RN, BSN Entered By: Gretta Cool, BSN, RN, CWS, Kim on 04/29/2017 16:00:36 Schissler, [name] ([id number]) -------------------------------------------------------------------------------- Pain Assessment Details Patient Name: [name] Date of Service: 04/29/2017 3:00 PM Medical Record Number: [id number] Patient Account Number: [id number] Date of Birth/Sex: 08-23-1940 (76 y.o. M) Treating RN: Roger Shelter Primary Care Raffael Bugarin: Cyndi Bender Other Clinician: Referring Lanetta Figuero: Cyndi Bender  Treating Jamesrobert Ohanesian/Extender: Ricard Dillon Weeks in Treatment:  3 Active Problems Location of Pain Severity and Description of Pain Patient Has Paino No Site Locations Pain Management and Medication Current Pain Management: Electronic Signature(s) Signed: 04/29/2017 4:28:16 PM By: Roger Shelter Entered By: Roger Shelter on 04/29/2017 15:15:58 Wilmott, [name] ([id number]) -------------------------------------------------------------------------------- Patient/Caregiver Education Details Patient Name: [name] Date of Service: 04/29/2017 3:00 PM Medical Record Number: [id number] Patient Account Number: [id number] Date of Birth/Gender: 16-Jun-1940 (76 y.o. M) Treating RN: Montey Hora Primary Care Physician: Cyndi Bender Other Clinician: Referring Physician: Cyndi Bender Treating Physician/Extender: Tito Dine in Treatment: 3 Education Assessment Education Provided To: Patient Education Topics Provided Venous: Handouts: Other: leg elevation Methods: Explain/Verbal Responses: State content correctly Electronic Signature(s) Signed: 04/29/2017 5:00:43 PM By: Montey Hora Entered By: Montey Hora on 04/29/2017 16:57:58 Bustamante, [name] ([id number]) -------------------------------------------------------------------------------- Wound Assessment Details Patient Name: [name] Date of Service: 04/29/2017 3:00 PM Medical Record Number: [id number] Patient Account Number: [id number] Date of Birth/Sex: January 09, 1940 (76 y.o. M) Treating RN: Roger Shelter Primary Care Adalene Gulotta: Cyndi Bender Other Clinician: Referring Ilisha Blust: Cyndi Bender Treating Ahliya Glatt/Extender: Tito Dine in Treatment: 3 Wound Status Wound Number: 4 Primary Etiology: Arterial Insufficiency Ulcer Wound Location: Right, Proximal, Lateral Lower Leg Wound Status: Open Wounding Event: Gradually Appeared Date Acquired: 04/08/2017 Weeks Of Treatment: 3 Clustered Wound: No Photos Photo Uploaded By: Montey Hora  on 04/29/2017 15:42:13 Wound Measurements Length: (cm) 0.4 Width: (cm) 0.3 Depth: (cm) 0.1 Area: (cm) 0.094 Volume: (cm) 0.009 % Reduction in Area: 52% % Reduction in Volume: 84.7% Wound Description Full Thickness Without Exposed Support Classification: Structures Periwound Skin Texture Texture Color No Abnormalities Noted: No No Abnormalities Noted: No Moisture No Abnormalities Noted: No Treatment Notes Wound #4 (Right, Proximal, Lateral Lower Leg) 1. Cleansed with: Clean wound with Normal Saline 2. Anesthetic Topical Lidocaine 4% cream to wound bed prior to debridement Amalfitano, Zyad E. ([id number]) 4. Dressing Applied: Prisma Ag 5. Secondary Dressing Applied ABD Pad 7. Secured with 3 Layer Compression System - Right Lower Extremity Electronic Signature(s) Signed: 04/29/2017 4:28:16 PM By: Roger Shelter Entered By: Roger Shelter on 04/29/2017 15:26:49 Hodzic, [name] ([id number]) -------------------------------------------------------------------------------- Wound Assessment Details Patient Name: [name] Date of Service: 04/29/2017 3:00 PM Medical Record Number: [id number] Patient Account Number: [id number] Date of Birth/Sex: 1940-05-12 (76 y.o. M) Treating RN: Roger Shelter Primary Care Romona Murdy: Cyndi Bender Other Clinician: Referring Dorleen Kissel: Cyndi Bender Treating Giorgia Wahler/Extender: Tito Dine in Treatment: 3 Wound Status Wound Number: 5 Primary Etiology: Arterial Insufficiency Ulcer Wound Location: Right, Distal, Lateral Lower Leg Wound Status: Open Wounding Event: Gradually Appeared Date Acquired: 01/15/2017 Weeks Of Treatment: 3 Clustered Wound: No Photos Photo Uploaded By: Montey Hora on 04/29/2017 15:42:14 Wound Measurements Length: (cm) 0.3 Width: (cm) 0.33 Depth: (cm) 0.1 Area: (cm) 0.078 Volume: (cm) 0.008 % Reduction in Area: 66.9% % Reduction in Volume: 83% Wound Description Full  Thickness Without Exposed Support Classification: Structures Periwound Skin Texture Texture Color No Abnormalities Noted: No No Abnormalities Noted: No Moisture No Abnormalities Noted: No Treatment Notes Wound #5 (Right, Distal, Lateral Lower Leg) 1. Cleansed with: Clean wound with Normal Saline 2. Anesthetic Topical Lidocaine 4% cream to wound bed prior to debridement Mcever, Candido E. ([id number]) 4. Dressing Applied: Prisma Ag 5. Secondary Dressing Applied ABD Pad 7. Secured with 3 Layer Compression System - Right Lower Extremity Electronic Signature(s) Signed: 04/29/2017 4:28:16 PM By: Roger Shelter Entered By: Roger Shelter on 04/29/2017 15:26:49 Vigna, Emmerson E. (  [id number]) -------------------------------------------------------------------------------- Wound Assessment Details Patient Name: JAHEIM, CANINO Date of Service: 04/29/2017 3:00 PM Medical Record Number: [id number] Patient Account Number: [id number] Date of Birth/Sex: 1940-09-10 (76 y.o. M) Treating RN: Roger Shelter Primary Care Ariyan Sinnett: Cyndi Bender Other Clinician: Referring Tobias Avitabile: Cyndi Bender Treating Denzil Mceachron/Extender: Tito Dine in Treatment: 3 Wound Status Wound Number: 6 Primary Etiology: Arterial Insufficiency Ulcer Wound Location: Right, Lateral Malleolus Wound Status: Open Wounding Event: Gradually Appeared Date Acquired: 03/23/2017 Weeks Of Treatment: 3 Clustered Wound: No Photos Photo Uploaded By: Montey Hora on 04/29/2017 15:43:06 Wound Measurements Length: (cm) 0.5 Width: (cm) 0.6 Depth: (cm) 0.2 Area: (cm) 0.236 Volume: (cm) 0.047 % Reduction in Area: -661.3% % Reduction in Volume: -1466.7% Wound Description Full Thickness Without Exposed Support Classification: Structures Periwound Skin Texture Texture Color No Abnormalities Noted: No No Abnormalities Noted: No Moisture No Abnormalities Noted: No Treatment Notes Wound #6 (Right,  Lateral Malleolus) 1. Cleansed with: Clean wound with Normal Saline 2. Anesthetic Topical Lidocaine 4% cream to wound bed prior to debridement Desmarais, Tannar E. ([id number]) 4. Dressing Applied: Prisma Ag 5. Secondary Dressing Applied ABD Pad 7. Secured with 3 Layer Compression System - Right Lower Extremity Electronic Signature(s) Signed: 04/29/2017 4:28:16 PM By: Roger Shelter Entered By: Roger Shelter on 04/29/2017 15:26:49 Lampe, [name] ([id number]) -------------------------------------------------------------------------------- Wound Assessment Details Patient Name: [name] Date of Service: 04/29/2017 3:00 PM Medical Record Number: [id number] Patient Account Number: [id number] Date of Birth/Sex: [birth year] (76 y.o. M) Treating RN: Roger Shelter Primary Care Virgilene Stryker: Cyndi Bender Other Clinician: Referring Sheralee Qazi: Cyndi Bender Treating Blondine Hottel/Extender: Tito Dine in Treatment: 3 Wound Status Wound Number: 8 Primary To be determined Etiology: Wound Location: Right [name] - Lateral Wound Open Wounding Event: Gradually Appeared Status: Date Acquired: 04/29/2017 Comorbid Congestive Heart Failure, Coronary Artery Weeks Of Treatment: 0 History: Disease, Hypertension, Myocardial Infarction, Clustered Wound: No Osteoarthritis Photos Photo Uploaded By: Montey Hora on 04/29/2017 15:43:07 Wound Measurements Length: (cm) 0.3 Width: (cm) 0.3 Depth: (cm) 0.1 Area: (cm) 0.071 Volume: (cm) 0.007 % Reduction in Area: % Reduction in Volume: Epithelialization: Large (67-100%) Tunneling: No Undermining: No Wound Description Classification: Partial Thickness Foul Wound Margin: Flat and Intact Sloug Exudate Amount: None Present Odor After Cleansing: No h/Fibrino Yes Wound Bed Granulation Amount: None Present (0%) Exposed Structure Necrotic Amount: Large (67-100%) Fascia Exposed: No Necrotic Quality: Eschar Fat Layer  (Subcutaneous Tissue) Exposed: No Tendon Exposed: No Muscle Exposed: No Joint Exposed: No Bone Exposed: No Periwound Skin Texture Texture Color No Abnormalities Noted: No No Abnormalities Noted: No Helming, Mihran E. ([id number]) Callus: No Atrophie Blanche: No Crepitus: No Cyanosis: No Excoriation: No Ecchymosis: No Induration: No Erythema: No Rash: No Hemosiderin Staining: No Scarring: No Mottled: No Pallor: No Moisture Rubor: No No Abnormalities Noted: No Dry / Scaly: No Temperature / Pain Maceration: No Temperature: No Abnormality Wound Preparation Ulcer Cleansing: Rinsed/Irrigated with Saline Topical Anesthetic Applied: Other: lidocaine 4%, Treatment Notes Wound #8 (Right, Lateral [name]) 1. Cleansed with: Clean wound with Normal Saline 2. Anesthetic Topical Lidocaine 4% cream to wound bed prior to debridement 4. Dressing Applied: Prisma Ag 5. Secondary Dressing Applied ABD Pad 7. Secured with 3 Layer Compression System - Right Lower Extremity Electronic Signature(s) Signed: 04/29/2017 4:28:16 PM By: Roger Shelter Entered By: Roger Shelter on 04/29/2017 15:28:45 Speelman, [name] ([id number]) -------------------------------------------------------------------------------- Vitals Details Patient Name: [name] Date of Service: 04/29/2017 3:00 PM Medical Record Number: [id number] Patient Account Number: [id number] Date of Birth/Sex: [birth year] (76 y.o. M) Treating  RN: Roger Shelter Primary Care Zayaan Kozak: Cyndi Bender Other Clinician: Referring Marilena Trevathan: Cyndi Bender Treating Enrico Eaddy/Extender: Tito Dine in Treatment: 3 Vital Signs Time Taken: 15:15 Temperature (F): 97.8 Height (in): 68 Pulse (bpm): 62 Respiratory Rate (breaths/min): 18 Blood Pressure (mmHg): 93/57 Reference Range: 80 - 120 mg / dl Electronic Signature(s) Signed: 04/29/2017 4:28:16 PM By: Roger Shelter Entered By: Roger Shelter on 04/29/2017  15:16:54

## 2017-05-04 DIAGNOSIS — I5023 Acute on chronic systolic (congestive) heart failure: Secondary | ICD-10-CM | POA: Diagnosis not present

## 2017-05-04 DIAGNOSIS — I110 Hypertensive heart disease with heart failure: Secondary | ICD-10-CM | POA: Diagnosis not present

## 2017-05-04 DIAGNOSIS — M069 Rheumatoid arthritis, unspecified: Secondary | ICD-10-CM | POA: Diagnosis not present

## 2017-05-04 DIAGNOSIS — I255 Ischemic cardiomyopathy: Secondary | ICD-10-CM | POA: Diagnosis not present

## 2017-05-04 DIAGNOSIS — M6281 Muscle weakness (generalized): Secondary | ICD-10-CM | POA: Diagnosis not present

## 2017-05-04 DIAGNOSIS — I2510 Atherosclerotic heart disease of native coronary artery without angina pectoris: Secondary | ICD-10-CM | POA: Diagnosis not present

## 2017-05-05 ENCOUNTER — Ambulatory Visit (INDEPENDENT_AMBULATORY_CARE_PROVIDER_SITE_OTHER): Payer: Medicare Other | Admitting: Vascular Surgery

## 2017-05-05 ENCOUNTER — Ambulatory Visit (INDEPENDENT_AMBULATORY_CARE_PROVIDER_SITE_OTHER): Payer: Medicare Other

## 2017-05-05 DIAGNOSIS — L97912 Non-pressure chronic ulcer of unspecified part of right lower leg with fat layer exposed: Secondary | ICD-10-CM | POA: Diagnosis not present

## 2017-05-05 DIAGNOSIS — I5023 Acute on chronic systolic (congestive) heart failure: Secondary | ICD-10-CM | POA: Diagnosis not present

## 2017-05-05 DIAGNOSIS — I255 Ischemic cardiomyopathy: Secondary | ICD-10-CM | POA: Diagnosis not present

## 2017-05-05 DIAGNOSIS — M069 Rheumatoid arthritis, unspecified: Secondary | ICD-10-CM | POA: Diagnosis not present

## 2017-05-05 DIAGNOSIS — I110 Hypertensive heart disease with heart failure: Secondary | ICD-10-CM | POA: Diagnosis not present

## 2017-05-05 DIAGNOSIS — E039 Hypothyroidism, unspecified: Secondary | ICD-10-CM | POA: Diagnosis not present

## 2017-05-05 DIAGNOSIS — G4730 Sleep apnea, unspecified: Secondary | ICD-10-CM | POA: Diagnosis not present

## 2017-05-05 DIAGNOSIS — S81811D Laceration without foreign body, right lower leg, subsequent encounter: Secondary | ICD-10-CM | POA: Diagnosis not present

## 2017-05-05 DIAGNOSIS — M545 Low back pain: Secondary | ICD-10-CM | POA: Diagnosis not present

## 2017-05-05 DIAGNOSIS — I2510 Atherosclerotic heart disease of native coronary artery without angina pectoris: Secondary | ICD-10-CM | POA: Diagnosis not present

## 2017-05-06 ENCOUNTER — Encounter: Payer: Medicare Other | Attending: Internal Medicine | Admitting: Internal Medicine

## 2017-05-06 DIAGNOSIS — L97512 Non-pressure chronic ulcer of other part of right foot with fat layer exposed: Secondary | ICD-10-CM | POA: Diagnosis not present

## 2017-05-06 DIAGNOSIS — I890 Lymphedema, not elsewhere classified: Secondary | ICD-10-CM | POA: Diagnosis not present

## 2017-05-06 DIAGNOSIS — I110 Hypertensive heart disease with heart failure: Secondary | ICD-10-CM | POA: Diagnosis not present

## 2017-05-06 DIAGNOSIS — Z95810 Presence of automatic (implantable) cardiac defibrillator: Secondary | ICD-10-CM | POA: Diagnosis not present

## 2017-05-06 DIAGNOSIS — I472 Ventricular tachycardia: Secondary | ICD-10-CM | POA: Diagnosis not present

## 2017-05-06 DIAGNOSIS — I252 Old myocardial infarction: Secondary | ICD-10-CM | POA: Diagnosis not present

## 2017-05-06 DIAGNOSIS — I255 Ischemic cardiomyopathy: Secondary | ICD-10-CM | POA: Diagnosis not present

## 2017-05-06 DIAGNOSIS — L97516 Non-pressure chronic ulcer of other part of right foot with bone involvement without evidence of necrosis: Secondary | ICD-10-CM | POA: Diagnosis not present

## 2017-05-06 DIAGNOSIS — L97521 Non-pressure chronic ulcer of other part of left foot limited to breakdown of skin: Secondary | ICD-10-CM | POA: Insufficient documentation

## 2017-05-06 DIAGNOSIS — Z9889 Other specified postprocedural states: Secondary | ICD-10-CM | POA: Diagnosis not present

## 2017-05-06 DIAGNOSIS — I509 Heart failure, unspecified: Secondary | ICD-10-CM | POA: Diagnosis not present

## 2017-05-06 DIAGNOSIS — I2510 Atherosclerotic heart disease of native coronary artery without angina pectoris: Secondary | ICD-10-CM | POA: Insufficient documentation

## 2017-05-06 DIAGNOSIS — L97519 Non-pressure chronic ulcer of other part of right foot with unspecified severity: Secondary | ICD-10-CM | POA: Diagnosis not present

## 2017-05-06 DIAGNOSIS — I771 Stricture of artery: Secondary | ICD-10-CM | POA: Diagnosis not present

## 2017-05-06 DIAGNOSIS — L97211 Non-pressure chronic ulcer of right calf limited to breakdown of skin: Secondary | ICD-10-CM | POA: Diagnosis not present

## 2017-05-06 DIAGNOSIS — M069 Rheumatoid arthritis, unspecified: Secondary | ICD-10-CM | POA: Insufficient documentation

## 2017-05-06 DIAGNOSIS — L97312 Non-pressure chronic ulcer of right ankle with fat layer exposed: Secondary | ICD-10-CM | POA: Diagnosis not present

## 2017-05-06 DIAGNOSIS — I70233 Atherosclerosis of native arteries of right leg with ulceration of ankle: Secondary | ICD-10-CM | POA: Diagnosis not present

## 2017-05-08 DIAGNOSIS — M069 Rheumatoid arthritis, unspecified: Secondary | ICD-10-CM | POA: Diagnosis not present

## 2017-05-08 DIAGNOSIS — S81811D Laceration without foreign body, right lower leg, subsequent encounter: Secondary | ICD-10-CM | POA: Diagnosis not present

## 2017-05-08 DIAGNOSIS — I255 Ischemic cardiomyopathy: Secondary | ICD-10-CM | POA: Diagnosis not present

## 2017-05-08 DIAGNOSIS — I110 Hypertensive heart disease with heart failure: Secondary | ICD-10-CM | POA: Diagnosis not present

## 2017-05-08 DIAGNOSIS — I2510 Atherosclerotic heart disease of native coronary artery without angina pectoris: Secondary | ICD-10-CM | POA: Diagnosis not present

## 2017-05-08 DIAGNOSIS — I5023 Acute on chronic systolic (congestive) heart failure: Secondary | ICD-10-CM | POA: Diagnosis not present

## 2017-05-09 NOTE — Progress Notes (Signed)
DEMONTEZ, NOVACK ([id number]) Visit Report for 05/06/2017 Debridement Details Patient Name: [name], [name] Date of Service: 05/06/2017 2:15 PM Medical Record Number: [id number] Patient Account Number: [id number] Date of Birth/Sex: [birth year] (77 y.o. M) Treating RN: Montey Hora Primary Care Provider: Cyndi Bender Other Clinician: Referring Provider: Cyndi Bender Treating Provider/Extender: Tito Dine in Treatment: 4 Debridement Performed for Wound #6 Right,Lateral Malleolus Assessment: Performed By: Physician Ricard Dillon, MD Debridement Type: Debridement Severity of Tissue Pre Fat layer exposed Debridement: Pre-procedure Verification/Time Yes - 15:05 Out Taken: Start Time: 15:05 Pain Control: Lidocaine 4% Topical Solution Total Area Debrided (L x W): 0.6 (cm) x 0.6 (cm) = 0.36 (cm) Tissue and other material Viable, Non-Viable, Eschar, Slough, Subcutaneous, Slough debrided: Level: Skin/Subcutaneous Tissue Debridement Description: Excisional Instrument: Curette Bleeding: Minimum Hemostasis Achieved: Pressure End Time: 15:07 Procedural Pain: 0 Post Procedural Pain: 0 Response to Treatment: Procedure was tolerated well Post Debridement Measurements of Total Wound Length: (cm) 0.6 Width: (cm) 0.6 Depth: (cm) 0.2 Volume: (cm) 0.057 Character of Wound/Ulcer Post Debridement: Improved Severity of Tissue Post Debridement: Fat layer exposed Post Procedure Diagnosis Same as Pre-procedure Electronic Signature(s) Signed: 05/06/2017 4:28:35 PM By: Linton Ham MD Signed: 05/06/2017 4:48:19 PM By: Montey Hora Entered By: Linton Ham on 05/06/2017 16:08:00 [name], [name] ([id number]) -------------------------------------------------------------------------------- Debridement Details Patient Name: [name] Date of Service: 05/06/2017 2:15 PM Medical Record Number: [id number] Patient Account Number: [id number] Date of Birth/Sex:  1940/03/04 (77 y.o. M) Treating RN: Montey Hora Primary Care Provider: Cyndi Bender Other Clinician: Referring Provider: Cyndi Bender Treating Provider/Extender: Tito Dine in Treatment: 4 Debridement Performed for Wound #8 Right,Lateral [name] Assessment: Performed By: Physician Ricard Dillon, MD Debridement Type: Debridement Pre-procedure Verification/Time Yes - 15:07 Out Taken: Start Time: 15:07 Pain Control: Lidocaine 4% Topical Solution Total Area Debrided (L x W): 0.4 (cm) x 0.5 (cm) = 0.2 (cm) Tissue and other material Viable, Non-Viable, Eschar, Slough, Subcutaneous, Slough debrided: Level: Skin/Subcutaneous Tissue Debridement Description: Excisional Instrument: Curette Bleeding: Minimum Hemostasis Achieved: Pressure End Time: 15:09 Procedural Pain: 0 Post Procedural Pain: 0 Response to Treatment: Procedure was tolerated well Post Debridement Measurements of Total Wound Length: (cm) 0.4 Width: (cm) 0.5 Depth: (cm) 0.2 Volume: (cm) 0.031 Character of Wound/Ulcer Post Debridement: Improved Post Procedure Diagnosis Same as Pre-procedure Electronic Signature(s) Signed: 05/06/2017 4:28:35 PM By: Linton Ham MD Signed: 05/06/2017 4:48:19 PM By: Montey Hora Entered By: Linton Ham on 05/06/2017 16:08:16 [name], [name] ([id number]) -------------------------------------------------------------------------------- HPI Details Patient Name: [name] Date of Service: 05/06/2017 2:15 PM Medical Record Number: [id number] Patient Account Number: [id number] Date of Birth/Sex: [birth year] (77 y.o. M) Treating RN: Montey Hora Primary Care Provider: Cyndi Bender Other Clinician: Referring Provider: Cyndi Bender Treating Provider/Extender: Tito Dine in Treatment: 4 History of Present Illness HPI Description: 04/08/17; this is a complex 77 year old man referred here from  [facility]. He had  been referred there for bilateral lower extremity edema with ulcer formation predominantly on the right calf but also the right [name]. He had been receiving Unna boots bilaterally. The history here is long. He is not a diabetic however ICU looking through late 2018 he was worked up for chronic headaches, elevated inflammatory markers including C-reactive protein and ESR. He went on to actually have a left temporal artery biopsy wasn't that was negative.he received about 6 weeks of high-dose prednisone 60 mg with improvement in his inflammatory markers. He was admitted to hospital in late November with ventricular tachycardia  syncope. He has known ischemic cardiomyopathy. He was admitted in the hospital in mid December. Apparently this was precipitated by a syncopal spell falling out of his scooter while at [place]. There was ventricular arrhythmia. He has an implantable defibrillator and echocardiogram showed severe LV dysfunction with an EF of 20% and valvular regurgitations including mild AR, moderate MR. There was no stenosis. He ruled in for a non-ST elevation MI in the setting of V. tach.his wife states that sometime during this hospitalization she noted multiple areas of skin change on the right lower calf which became evident just after he left the hospital. He was back in hospital in February with acute renal failure hyponatremia. This responded to fluid resuscitation.. Interestingly I can't see much description of his right leg at that point in time.he was followed by Dr. Nehemiah Massed of dermatology for the necrotic wounds on his right leg. Apparently a biopsy was planned at one point but not done although in some notes that suggests it was. I cannot see these results area He was noted to have a lot of edema. Was treated with bilateral Unna boots edges really helped with the swelling they have been using Bactroban to small open areas predominantly on the right anterior lower leg His history is  complicated by the fact that he has rheumatoid arthritis followed by rheumatology. He is followed by neurology for disabling headaches. At one point this was felt to be giant cell arteritis although a left temporal biopsy was apparently negative. He was given a prolonged course of prednisone at 60 mg which managed his sedimentation rates but apparently did not prove improve the headaches. This is been tapered to off on by rheumatology on 03/13/17 Vascular had plans to do a venous reflux workup as well as arterial studies in May. They also wanted to get him a lymphedema pump. As mentioned he's been using bacitracin under Unna boot wraps to both lower legs 04/15/17; the patient arrives with most of his wounds improved. These are small punched out wounds. Most of them remaining ones are on the right anterior calf with the most problematic over the right lateral malleolus. There are no new areas. The symptom complex or potential symptom complex we are dealing with his chronic disabling headaches with inflammatory markers not responsive to prednisone and with a negative temporal biopsy, lower extremity weakness, skin ulcerations just on the right leg. We have managed to get his arterial studies moved up to April 30. He has a rheumatology consult at [facility] in June. He has seen dermatology locally, rheumatology locally, neurology locally. 04/22/17; small punched out areas on the right leg anteriorly posteriorly. Most of these appear to have closed over. Some of them have eschar over the surface. The most problematic area appears to be over the right lateral malleolus. We've been using prisma to all of this. He has arterial studies on April 30 and a rheumatology consult at [facility] on June 28 04/29/17; most of the small punched out areas on the right leg posteriorly are closed. He has 2 or 3 openings anteriorly but most of these appear to be on the way to closing. Still problematically over the right lateral malleolus  and right lateral [name] with almost ischemic-looking eschar. His arterial studies that I ordered are due to be done next week on the 30th so we should have them available for our next visit hopefully. He also saw a rheumatologist at [facility] and according to the patient he did 8 vials of blood. Finally  he has scaling rash on his left [name] and what looks to be at [facility] area on the left anterior leg 05/06/17; most of the small punched out areas on the right leg posteriorly and anteriorly are closed. He still has one small one anteriorly one over the right lateral malleolus and one over the right lateral [name]. SOSAIA, PITTINGER ([id number]) He had his arterial studies they didn't seem to do waveform analysis not exactly sure why however in any case is ABIs were noncompressible bilaterally. They did not provide TBIs although looking at the pressures it appears that his TBIs are quite normal. I therefore went ahead and debrided the area over the right lateral malleolus and the right lateral [name] Electronic Signature(s) Signed: 05/06/2017 4:28:35 PM By: Linton Ham MD Entered By: Linton Ham on 05/06/2017 16:09:51 [name], [name] ([id number]) -------------------------------------------------------------------------------- Physical Exam Details Patient Name: [name] Date of Service: 05/06/2017 2:15 PM Medical Record Number: [id number] Patient Account Number: [id number] Date of Birth/Sex: 28-Feb-1940 (77 y.o. M) Treating RN: Montey Hora Primary Care Provider: Cyndi Bender Other Clinician: Referring Provider: Cyndi Bender Treating Provider/Extender: Tito Dine in Treatment: 4 Constitutional Patient is hypotensive.however he appears well. Pulse regular and within target range for patient.. Temperature is normal and within the target range for the patient.[name] appears in no distress. Notes when exam; the multiple areas on the right posterior and anterior calves are  either completely epithelialized are scabbed over. He has a small open area right anteriorly also the remaining areas on the right lateral malleolus and right lateral [name]. Using a #5 curet the right lateral malleolus and right lateral [name] were debrided. Hemostasis with direct pressure.we remove thick eschar and nonviable subcutaneous tissue Electronic Signature(s) Signed: 05/06/2017 4:28:35 PM By: Linton Ham MD Entered By: Linton Ham on 05/06/2017 16:13:17 [name], [name] ([id number]) -------------------------------------------------------------------------------- Physician Orders Details Patient Name: [name] Date of Service: 05/06/2017 2:15 PM Medical Record Number: [id number] Patient Account Number: [id number] Date of Birth/Sex: 1940-09-08 (77 y.o. M) Treating RN: Montey Hora Primary Care Provider: Cyndi Bender Other Clinician: Referring Provider: Cyndi Bender Treating Provider/Extender: Tito Dine in Treatment: 4 Verbal / Phone Orders: No Diagnosis Coding Wound Cleansing Wound #4 Right,Proximal,Lateral Lower Leg o Clean wound with Normal Saline. Wound #6 Right,Lateral Malleolus o Clean wound with Normal Saline. Wound #8 Right,Lateral [name] o Clean wound with Normal Saline. Anesthetic (add to Medication List) Wound #4 Right,Proximal,Lateral Lower Leg o Topical Lidocaine 4% cream applied to wound bed prior to debridement (In Clinic Only). Wound #6 Right,Lateral Malleolus o Topical Lidocaine 4% cream applied to wound bed prior to debridement (In Clinic Only). Wound #8 Right,Lateral [name] o Topical Lidocaine 4% cream applied to wound bed prior to debridement (In Clinic Only). Primary Wound Dressing Wound #4 Right,Proximal,Lateral Lower Leg o Silver Collagen - all wounds Wound #6 Right,Lateral Malleolus o Silver Collagen - all wounds Wound #8 Right,Lateral [name] o Silver Collagen - all wounds Secondary Dressing Wound #4  Right,Proximal,Lateral Lower Leg o ABD pad - over collegen, Unna paste to Anchor at top Wound #6 Right,Lateral Malleolus o ABD pad - over collegen, Unna paste to Anchor at top Wound #8 Right,Lateral [name] o ABD pad - over collegen, Unna paste to Anchor at top o Other - foam and cover bandage to left 1st met head Dressing Change Frequency Wound #4 Right,Proximal,Lateral Lower Leg o Change Dressing Monday, Wednesday, Friday LAVELL, SUPPLE ([id number]) Wound #6 Right,Lateral Malleolus o Change Dressing Monday, Wednesday, Friday Wound #  8 Right,Lateral [name] o Change Dressing Monday, Wednesday, Friday Follow-up Appointments Wound #4 Right,Proximal,Lateral Lower Leg o Return Appointment in 1 week. Wound #6 Right,Lateral Malleolus o Return Appointment in 1 week. Wound #8 Right,Lateral [name] o Return Appointment in 1 week. Edema Control Wound #4 Right,Proximal,Lateral Lower Leg o 3 Layer Compression System - Right Lower Extremity Wound #6 Right,Lateral Malleolus o 3 Layer Compression System - Right Lower Extremity Wound #8 Right,Lateral [name] o 3 Layer Compression System - Right Lower Extremity Home Health Wound #4 Right,Proximal,Lateral Lower Leg o Continue Home Health Visits o Home Health Nurse may visit PRN to address patientos wound care needs. o FACE TO FACE ENCOUNTER: MEDICARE and MEDICAID PATIENTS: I certify that this patient is under my care and that I had a face-to-face encounter that meets the physician face-to-face encounter requirements with this patient on this date. The encounter with the patient was in whole or in part for the following MEDICAL CONDITION: (primary reason for [place]) MEDICAL NECESSITY: I certify, that based on my findings, NURSING services are a medically necessary home health service. HOME BOUND STATUS: I certify that my clinical findings support that this patient is homebound (i.e., Due to illness or injury, pt  requires aid of supportive devices such as crutches, cane, wheelchairs, walkers, the use of special transportation or the assistance of another person to leave their place of residence. There is a normal inability to leave the home and doing so requires considerable and taxing effort. Other absences are for medical reasons / religious services and are infrequent or of short duration when for other reasons). o If current dressing causes regression in wound condition, may D/C ordered dressing product/s and apply Normal Saline Moist Dressing daily until next [place] / Other MD appointment. [name] of regression in wound condition at [phone]. o Please direct any NON-WOUND related issues/requests for orders to patient's Primary Care Physician Wound #6 [place] Nurse may visit PRN to address patientos wound care needs. o FACE TO FACE ENCOUNTER: MEDICARE and MEDICAID PATIENTS: I certify that this patient is under my care and that I had a face-to-face encounter that meets the physician face-to-face encounter requirements with this patient on this date. The encounter with the patient was in whole or in part for the following MEDICAL CONDITION: (primary reason for [place]) MEDICAL NECESSITY: I certify, that based on my findings, NURSING services are a medically necessary home health service. HOME BOUND STATUS: I certify that my clinical findings support that this patient is homebound (i.e., Due to illness or injury, pt requires aid of supportive devices such as crutches, cane, wheelchairs, walkers, the use of special transportation or the assistance of another person to leave their place of residence. There is a normal inability to leave the home Nop, Nobel E. ([id number]) and doing so requires considerable and taxing effort. Other absences are for medical reasons / religious services  and are infrequent or of short duration when for other reasons). o If current dressing causes regression in wound condition, may D/C ordered dressing product/s and apply Normal Saline Moist Dressing daily until next [place] / Other MD appointment. [place] of regression in wound condition at [phone]. o Please direct any NON-WOUND related issues/requests for orders to patient's Primary Care Physician Wound #8 [place] Nurse may visit PRN to address patientos wound care needs. o FACE TO  FACE ENCOUNTER: MEDICARE and MEDICAID PATIENTS: I certify that this patient is under my care and that I had a face-to-face encounter that meets the physician face-to-face encounter requirements with this patient on this date. The encounter with the patient was in whole or in part for the following MEDICAL CONDITION: (primary reason for [place]) MEDICAL NECESSITY: I certify, that based on my findings, NURSING services are a medically necessary home health service. HOME BOUND STATUS: I certify that my clinical findings support that this patient is homebound (i.e., Due to illness or injury, pt requires aid of supportive devices such as crutches, cane, wheelchairs, walkers, the use of special transportation or the assistance of another person to leave their place of residence. There is a normal inability to leave the home and doing so requires considerable and taxing effort. Other absences are for medical reasons / religious services and are infrequent or of short duration when for other reasons). o If current dressing causes regression in wound condition, may D/C ordered dressing product/s and apply Normal Saline Moist Dressing daily until next [place] / Other MD appointment. [place] of regression in wound condition at [phone]. o Please direct any NON-WOUND related  issues/requests for orders to patient's Primary Care Physician Electronic Signature(s) Signed: 05/06/2017 4:28:35 PM By: Linton Ham MD Signed: 05/06/2017 4:48:19 PM By: Montey Hora Entered By: Montey Hora on 05/06/2017 15:11:57 [name], [name] ([id number]) -------------------------------------------------------------------------------- Problem List Details Patient Name: [name] Date of Service: 05/06/2017 2:15 PM Medical Record Number: [id number] Patient Account Number: [id number] Date of Birth/Sex: 1940-07-29 (77 y.o. 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 breakdown 04/08/2017 Yes of skin L97.511 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 limited to 04/08/2017 Yes breakdown of skin I25.5 Ischemic cardiomyopathy 04/08/2017 Yes Inactive Problems Resolved Problems Electronic Signature(s) Signed: 05/06/2017 4:28:35 PM By: Linton Ham MD Entered By: Linton Ham on 05/06/2017 16:07:30 [name], [name] ([id number]) -------------------------------------------------------------------------------- Progress Note Details Patient Name: [name] Date of Service: 05/06/2017 2:15 PM Medical Record Number: [id number] Patient Account Number: [id number] Date of Birth/Sex: 1940-05-26 (77 y.o. M) Treating RN: Montey Hora Primary Care Provider: Cyndi Bender Other Clinician: Referring Provider: Cyndi Bender Treating Provider/Extender: Tito Dine in Treatment: 4 Subjective History of Present Illness (HPI) 04/08/17; this is a complex  77 year old man referred here from [place] [facility] [facility]. He had been referred there for bilateral lower extremity edema with ulcer formation predominantly on the right calf but also the right [name]. He had been receiving Unna boots bilaterally. The history here is long. He is not a diabetic however ICU looking through late 2018 he was worked up for chronic headaches, elevated inflammatory markers including C-reactive protein and ESR. He went on to actually have a left temporal artery biopsy wasn't that was negative.he received about 6 weeks of high-dose prednisone 60 mg with improvement in his inflammatory markers. He was admitted to hospital in late November with ventricular tachycardia syncope. He has known ischemic cardiomyopathy. He was admitted in the hospital in mid December. Apparently this was precipitated by a syncopal spell falling out of his scooter while at [place]. There was ventricular  arrhythmia. He has an implantable defibrillator and echocardiogram showed severe LV dysfunction with an EF of 20% and valvular regurgitations including mild AR, moderate MR. There was no stenosis. He ruled in for a non-ST elevation MI in the setting of V. tach.his wife states that sometime during this hospitalization she noted multiple areas of skin change on the right lower calf which became evident just after he left the hospital. He was back in hospital in February with acute renal failure hyponatremia. This responded to fluid resuscitation.. Interestingly I can't see much description of his right leg at that point in time.he was followed by Dr. Nehemiah Massed of dermatology for the necrotic wounds on his right leg. Apparently a biopsy was planned at one point but not done although in some notes that suggests it was. I cannot see these results area He was noted to have a lot of edema. Was treated with bilateral Unna boots edges really helped with the swelling they have been using Bactroban to small  open areas predominantly on the right anterior lower leg His history is complicated by the fact that he has rheumatoid arthritis followed by rheumatology. He is followed by neurology for disabling headaches. At one point this was felt to be giant cell arteritis although a left temporal biopsy was apparently negative. He was given a prolonged course of prednisone at 60 mg which managed his sedimentation rates but apparently did not prove improve the headaches. This is been tapered to off on by rheumatology on 03/13/17 Vascular had plans to do a venous reflux workup as well as arterial studies in May. They also wanted to get him a lymphedema pump. As mentioned he's been using bacitracin under Unna boot wraps to both lower legs 04/15/17; the patient arrives with most of his wounds improved. These are small punched out wounds. Most of them remaining ones are on the right anterior calf with the most problematic over the right lateral malleolus. There are no new areas. The symptom complex or potential symptom complex we are dealing with his chronic disabling headaches with inflammatory markers not responsive to prednisone and with a negative temporal biopsy, lower extremity weakness, skin ulcerations just on the right leg. We have managed to get his arterial studies moved up to April 30. He has a rheumatology consult at [facility] in June. He has seen dermatology locally, rheumatology locally, neurology locally. 04/22/17; small punched out areas on the right leg anteriorly posteriorly. Most of these appear to have closed over. Some of them have eschar over the surface. The most problematic area appears to be over the right lateral malleolus. We've been using prisma to all of this. He has arterial studies on April 30 and a rheumatology consult at [facility] on June 28 04/29/17; most of the small punched out areas on the right leg posteriorly are closed. He has 2 or 3 openings anteriorly but most of these appear to be on  the way to closing. Still problematically over the right lateral malleolus and right lateral [name] with almost ischemic-looking eschar. His arterial studies that I ordered are due to be done next week on the 30th so we should have them available for our next visit hopefully. He also saw a rheumatologist at [facility] and according to the patient he did 8 vials of blood. Finally he has scaling rash on his left [name] and what looks to be at [facility] area on the left anterior leg 05/06/17; most of the small punched out areas on the right leg posteriorly  and anteriorly are closed. He still has one small one [name], Jermaine E. ([id number]) anteriorly one over the right lateral malleolus and one over the right lateral [name]. He had his arterial studies they didn't seem to do waveform analysis not exactly sure why however in any case is ABIs were noncompressible bilaterally. They did not provide TBIs although looking at the pressures it appears that his TBIs are quite normal. I therefore went ahead and debrided the area over the right lateral malleolus and the right lateral [name] Objective Constitutional Patient is hypotensive.however he appears well. Pulse regular and within target range for patient.. Temperature is normal and within the target range for the patient.[name] appears in no distress. Vitals Time Taken: 2:30 PM, Height: 68 in, Temperature: 97.6 F, Pulse: 67 bpm, Respiratory Rate: 18 breaths/min, Blood Pressure: 82/69 mmHg. General Notes: Took BP manually and it was 92/54 General Notes: when exam; the multiple areas on the right posterior and anterior calves are either completely epithelialized are scabbed over. He has a small open area right anteriorly also the remaining areas on the right lateral malleolus and right lateral [name]. Using a #5 curet the right lateral malleolus and right lateral [name] were debrided. Hemostasis with direct pressure.we remove thick eschar and nonviable subcutaneous  tissue Integumentary (Hair, Skin) Wound #4 status is Open. Original cause of wound was Gradually Appeared. The wound is located on the Right,Proximal,Lateral Lower Leg. The wound measures 0.2cm length x 0.2cm width x 0.1cm depth; 0.031cm^2 area and 0.003cm^3 volume. There is no tunneling or undermining noted. There is a small amount of drainage noted. The wound margin is flat and intact. There is medium (34-66%) red granulation within the wound bed. There is a medium (34-66%) amount of necrotic tissue within the wound bed including Adherent Slough. Periwound temperature was noted as No Abnormality. The periwound has tenderness on palpation. Wound #5 status is Healed - Epithelialized. Original cause of wound was Gradually Appeared. The wound is located on the Right,Distal,Lateral Lower Leg. The wound measures 0cm length x 0cm width x 0cm depth; 0cm^2 area and 0cm^3 volume. Wound #6 status is Open. Original cause of wound was Gradually Appeared. The wound is located on the Right,Lateral Malleolus. The wound measures 0.6cm length x 0.6cm width x 0.2cm depth; 0.283cm^2 area and 0.057cm^3 volume. There is no tunneling or undermining noted. There is a medium amount of serosanguineous drainage noted. The wound margin is distinct with the outline attached to the wound base. There is no granulation within the wound bed. There is a large (67-100%) amount of necrotic tissue within the wound bed including Eschar. Periwound temperature was noted as No Abnormality. The periwound has tenderness on palpation. Wound #8 status is Open. Original cause of wound was Gradually Appeared. The wound is located on the Right,Lateral [name]. The wound measures 0.4cm length x 0.5cm width x 0.1cm depth; 0.157cm^2 area and 0.016cm^3 volume. There is no tunneling or undermining noted. There is a medium amount of serosanguineous drainage noted. The wound margin is flat and intact. There is no granulation within the wound bed. There  is a large (67-100%) amount of necrotic tissue within the wound bed including Eschar. The periwound skin appearance did not exhibit: Callus, Crepitus, Excoriation, Induration, Rash, Scarring, Dry/Scaly, Maceration, Atrophie Blanche, Cyanosis, Ecchymosis, Hemosiderin Staining, Mottled, Pallor, Rubor, Erythema. Periwound temperature was noted as No Abnormality. 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 There was a Excisional Skin/Subcutaneous Tissue Debridement with a total area of 0.36 sq cm performed by Ricard Dillon, MD. With the following instrument(s): Curette. to remove Viable and Non-Viable tissue/material Material removed includes Eschar, Subcutaneous Tissue, and [place] after achieving pain control using Lidocaine 4% Topical Solution. No specimens were taken. A time out was conducted at 15:05, prior to the start of the procedure. A Minimum amount of bleeding was controlled with Pressure. The procedure was tolerated well with a pain level of 0 throughout and a pain level of 0 following the procedure. Post Debridement Measurements: 0.6cm length x 0.6cm width x 0.2cm depth; 0.057cm^3 volume. Character of Wound/Ulcer Post Debridement is improved. Severity of Tissue Post Debridement is: Fat layer exposed. Post procedure Diagnosis Wound #6: Same as Pre-Procedure Wound #8 Pre-procedure diagnosis of Wound #8 is a To be determined located on the Right,Lateral [name] . There was a  Excisional Skin/Subcutaneous Tissue Debridement with a total area of 0.2 sq cm performed by Ricard Dillon, MD. With the following instrument(s): Curette. to remove Viable and Non-Viable tissue/material Material removed includes Eschar, Subcutaneous Tissue, and [place] after achieving pain control using Lidocaine 4% Topical Solution. No specimens were taken. A time out was conducted at 15:07, prior to the start of the procedure. A Minimum amount of bleeding was controlled with Pressure. The procedure was tolerated well with a pain level of 0 throughout and a pain level of 0 following the procedure. Post Debridement Measurements: 0.4cm length x 0.5cm width x 0.2cm depth; 0.031cm^3 volume. Character of Wound/Ulcer Post Debridement is improved. Post procedure Diagnosis Wound #8: Same as Pre-Procedure Plan Wound Cleansing: Wound #4 Right,Proximal,Lateral Lower Leg: Clean wound with Normal Saline. Wound #6 Right,Lateral Malleolus: Clean wound with Normal Saline. Wound #8 Right,Lateral [name]: Clean wound with Normal Saline. Anesthetic (add to Medication List): Wound #4 Right,Proximal,Lateral Lower Leg: Topical Lidocaine 4% cream applied to wound bed prior to debridement (In Clinic Only). JOSELUIS, ALESSIO ([id number]) Wound #6 Right,Lateral Malleolus: Topical Lidocaine 4% cream applied to wound bed prior to debridement (In Clinic Only). Wound #8 Right,Lateral [name]: Topical Lidocaine 4% cream applied to wound bed prior to debridement (In Clinic Only). Primary Wound Dressing: Wound #4 Right,Proximal,Lateral Lower Leg: Silver Collagen - all wounds Wound #6 Right,Lateral Malleolus: Silver Collagen - all wounds Wound #8 Right,Lateral [name]: Silver Collagen - all wounds Secondary Dressing: Wound #4 Right,Proximal,Lateral Lower Leg: ABD pad - over collegen, Unna paste to Anchor at top Wound #6 Right,Lateral Malleolus: ABD pad - over collegen, Unna paste to Anchor at top Wound #8  Right,Lateral [name]: ABD pad - over collegen, Unna paste to Anchor at top Other - foam and cover bandage to left 1st met head Dressing Change Frequency: Wound #4 Right,Proximal,Lateral Lower Leg: Change Dressing Monday, Wednesday, Friday Wound #6 Right,Lateral Malleolus: Change Dressing Monday, Wednesday, Friday Wound #8 Right,Lateral [name]: Change Dressing Monday, Wednesday, Friday Follow-up Appointments: Wound #4 Right,Proximal,Lateral Lower Leg: Return Appointment in 1 week. Wound #6 Right,Lateral Malleolus: Return Appointment in 1 week. Wound #8 Right,Lateral [name]: Return Appointment in 1 week. Edema Control: Wound #4 Right,Proximal,Lateral Lower Leg: 3 Layer Compression System - Right  Lower Extremity Wound #6 Right,Lateral Malleolus: 3 Layer Compression System - Right Lower Extremity Wound #8 Right,Lateral [name]: 3 Layer Compression System - Right Lower Extremity Home Health: Wound #4 Right,Proximal,Lateral Lower Leg: [place] Nurse may visit PRN to address patient s wound care needs. FACE TO FACE ENCOUNTER: MEDICARE and MEDICAID PATIENTS: I certify that this patient is under my care and that I had a face-to-face encounter that meets the physician face-to-face encounter requirements with this patient on this date. The encounter with the patient was in whole or in part for the following MEDICAL CONDITION: (primary reason for [place]) MEDICAL NECESSITY: I certify, that based on my findings, NURSING services are a medically necessary home health service. HOME BOUND STATUS: I certify that my clinical findings support that this patient is homebound (i.e., Due to illness or injury, pt requires aid of supportive devices such as crutches, cane, wheelchairs, walkers, the use of special transportation or the assistance of another person to leave their place of residence. There is a normal inability to leave the home and doing so requires considerable  and taxing effort. Other absences are for medical reasons / religious services and are infrequent or of short duration when for other reasons). If current dressing causes regression in wound condition, may D/C ordered dressing product/s and apply Normal Saline Moist Dressing daily until next [place] / Other MD appointment. [place] of regression in wound condition at [phone]. Please direct any NON-WOUND related issues/requests for orders to patient's Primary Care Physician Wound #6 Right,Lateral Malleolus: [place] Nurse may visit PRN to address patient s wound care needs. [name], BRUUN ([id number]) FACE TO FACE ENCOUNTER: MEDICARE and MEDICAID PATIENTS: I certify that this patient is under my care and that I had a face-to-face encounter that meets the physician face-to-face encounter requirements with this patient on this date. The encounter with the patient was in whole or in part for the following MEDICAL CONDITION: (primary reason for [place]) MEDICAL NECESSITY: I certify, that based on my findings, NURSING services are a medically necessary home health service. HOME BOUND STATUS: I certify that my clinical findings support that this patient is homebound (i.e., Due to illness or injury, pt requires aid of supportive devices such as crutches, cane, wheelchairs, walkers, the use of special transportation or the assistance of another person to leave their place of residence. There is a normal inability to leave the home and doing so requires considerable and taxing effort. Other absences are for medical reasons / religious services and are infrequent or of short duration when for other reasons). If current dressing causes regression in wound condition, may D/C ordered dressing product/s and apply Normal Saline Moist Dressing daily until next [place] / Other MD appointment. [place] of  regression in wound condition at [phone]. Please direct any NON-WOUND related issues/requests for orders to patient's Primary Care Physician Wound #8 Right,Lateral [name]: [place] Nurse may visit PRN to address patient s wound care needs. FACE TO FACE ENCOUNTER: MEDICARE and MEDICAID PATIENTS: I certify that this patient is under my care and that I had a face-to-face encounter that meets the physician face-to-face encounter requirements with this patient on this date. The encounter with the patient was in whole or in part for the following MEDICAL CONDITION: (primary reason for [place]) MEDICAL NECESSITY: I certify, that based on my findings, NURSING services are  a medically necessary home health service. HOME BOUND STATUS: I certify that my clinical findings support that this patient is homebound (i.e., Due to illness or injury, pt requires aid of supportive devices such as crutches, cane, wheelchairs, walkers, the use of special transportation or the assistance of another person to leave their place of residence. There is a normal inability to leave the home and doing so requires considerable and taxing effort. Other absences are for medical reasons / religious services and are infrequent or of short duration when for other reasons). If current dressing causes regression in wound condition, may D/C ordered dressing product/s and apply Normal Saline Moist Dressing daily until next [place] / Other MD appointment. [place] of regression in wound condition at [phone]. Please direct any NON-WOUND related issues/requests for orders to patient's Primary Care Physician #1 exact etiology of this remains unclear nevertheless a lot of the wounds on the right leg have healed. #2 debridement of the 2 remaining nonviable surfaces was done today in view of the fact that his TBIs were normal. We did not get waveform analysis which  is unfortunate although I'm not sure why. #3 I'm continuing with silver collagen/ABDs/3 alert compression Electronic Signature(s) Signed: 05/06/2017 4:28:35 PM By: Linton Ham MD Entered By: Linton Ham on 05/06/2017 16:14:38 [name], [name] ([id number]) -------------------------------------------------------------------------------- SuperBill Details Patient Name: [name] Date of Service: 05/06/2017 Medical Record Number: [id number] Patient Account Number: [id number] Date of Birth/Sex: 28-Dec-1940 (77 y.o. 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 I25.5 Ischemic cardiomyopathy Facility Procedures CPT4 Code Description: [id number] - DEB SUBQ TISSUE 20 SQ CM/< ICD-10 Diagnosis Description L97.511 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 limited Modifier: to breakdown of Quantity: 1 skin Physician Procedures CPT4 Code Description: [id number] - WC PHYS SUBQ TISS 20 SQ CM ICD-10 Diagnosis Description L97.511 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 limited Modifier: to breakdown of Quantity: 1 skin Electronic Signature(s) Signed: 05/06/2017 4:28:35 PM By: Linton Ham MD Entered By: Linton Ham on 05/06/2017 16:15:03

## 2017-05-09 NOTE — Progress Notes (Signed)
[name], [name] ([id number]) Visit Report for 05/06/2017 Arrival Information Details Patient Name: [name], [name] Date of Service: 05/06/2017 2:15 PM Medical Record Number: [id number] Patient Account Number: [id number] Date of Birth/Sex: 1940/02/08 (76 y.o. M) Treating RN: Ahmed Prima Primary Care Vester Balthazor: Cyndi Bender Other Clinician: Referring Morgane Joerger: Cyndi Bender Treating Kemora Pinard/Extender: Tito Dine in Treatment: 4 Visit Information History Since Last Visit All ordered tests and consults were completed: No Patient Arrived: Walker Added or deleted any medications: No Arrival Time: 14:29 Any new allergies or adverse reactions: No Accompanied By: wife Had a fall or experienced change in No Transfer Assistance: EasyPivot Patient activities of daily living that may affect Lift risk of falls: Patient Identification Verified: Yes Signs or symptoms of abuse/neglect since last visito No Secondary Verification Process Yes Hospitalized since last visit: No Completed: Implantable device outside of the clinic excluding No Patient Requires Transmission-Based No cellular tissue based products placed in the center Precautions: since last visit: Patient Has Alerts: Yes Has Dressing in Place as Prescribed: Yes Patient Alerts: R ABI >[address] as Prescribed: Yes Pain Present Now: No Electronic Signature(s) Signed: 05/06/2017 4:27:21 PM By: Alric Quan Entered By: Alric Quan on 05/06/2017 14:30:22 Hawthorne, [name] ([id number]) -------------------------------------------------------------------------------- Encounter Discharge Information Details Patient Name: [name] Date of Service: 05/06/2017 2:15 PM Medical Record Number: [id number] Patient Account Number: [id number] Date of Birth/Sex: [birth year] (76 y.o. M) Treating RN: Montey Hora Primary Care Deaven Barron: Cyndi Bender Other Clinician: Referring Anyla Israelson: Cyndi Bender Treating Exzavier Ruderman/Extender: Tito Dine in Treatment: 4 Encounter Discharge Information Items Discharge Pain Level: 0 Discharge Condition: Stable Ambulatory Status: Walker Discharge Destination: Home Transportation: Private Auto Accompanied By: wife Schedule Follow-up Appointment: Yes Medication Reconciliation completed and No provided to Patient/Care Rodneisha Bonnet: Provided on Clinical Summary of Care: 05/06/2017 Form Type Recipient Paper Patient LM Electronic Signature(s) Signed: 05/06/2017 4:21:17 PM By: Roger Shelter Entered By: Roger Shelter on 05/06/2017 15:30:38 [name], [name] ([id number]) -------------------------------------------------------------------------------- Lower Extremity Assessment Details Patient Name: [name] Date of Service: 05/06/2017 2:15 PM Medical Record Number: [id number] Patient Account Number: [id number] Date of Birth/Sex: Jan 24, 1940 (76 y.o. M) Treating RN: Ahmed Prima Primary Care Duvan Mousel: Cyndi Bender Other Clinician: Referring Jamani Bearce: Cyndi Bender Treating Jonathin Heinicke/Extender: Tito Dine in Treatment: 4 Edema Assessment Assessed: [Left: No] [Right: No] [Left: Edema] [Right: :] Calf Left: Right: Point of Measurement: 27 cm From Medial Instep 29.7 cm cm Ankle Left: Right: Point of Measurement: 11 cm From Medial Instep 20.6 cm cm Vascular Assessment Pulses: Dorsalis Pedis Palpable: [Left:Yes] Posterior Tibial Extremity colors, hair growth, and conditions: Extremity Color: [Left:Normal] Temperature of Extremity: [Left:Warm] Capillary Refill: [Left:< 3 seconds] Toe Nail Assessment Left: Right: Thick: Yes Discolored: Yes Deformed: Yes Improper Length and Hygiene: Yes Electronic Signature(s) Signed: 05/06/2017 4:27:21 PM By: Alric Quan Entered By: Alric Quan on 05/06/2017 14:39:28 [name], [name]  ([id number]) -------------------------------------------------------------------------------- Multi Wound Chart Details Patient Name: [name] Date of Service: 05/06/2017 2:15 PM Medical Record Number: [id number] Patient Account Number: [id number] Date of Birth/Sex: 1940-08-10 (77 y.o. M) Treating RN: Montey Hora Primary Care Gavriela Cashin: Cyndi Bender Other Clinician: Referring Shannell Mikkelsen: Cyndi Bender Treating Huie Ghuman/Extender: Tito Dine in Treatment: 4 Vital Signs Height(in): 68 Pulse(bpm): 39 Weight(lbs): Blood Pressure(mmHg): 82/69 Body Mass Index(BMI): Temperature(F): 97.6 Respiratory Rate 18 (breaths/min): Photos: [4:No Photos] [5:No Photos] [6:No Photos] Wound Location: [4:Right Lower Leg - Lateral, Proximal] [5:Right, Distal, Lateral Lower Leg] [6:Right Malleolus - Lateral] Wounding Event: [4:Gradually Appeared] [5:Gradually  Appeared] [6:Gradually Appeared] Primary Etiology: [4:Arterial Insufficiency Ulcer] [5:Arterial Insufficiency Ulcer] [6:Arterial Insufficiency Ulcer] Comorbid History: [4:Congestive Heart Failure, Coronary Artery Disease, Hypertension, Myocardial Infarction, Osteoarthritis] [5:N/A] [6:Congestive Heart Failure, Coronary Artery Disease, Hypertension, Myocardial Infarction, Osteoarthritis] Date Acquired: [4:04/08/2017] [5:01/15/2017] [6:03/23/2017] Weeks of Treatment: [4:4] [5:4] [6:4] Wound Status: [4:Open] [5:Healed - Epithelialized] [6:Open] Measurements L x W x D [4:0.2x0.2x0.1] [5:0x0x0] [6:0.6x0.6x0.2] (cm) Area (cm) : [4:0.031] [5:0] [6:0.283] Volume (cm) : [4:0.003] [5:0] [6:0.057] % Reduction in Area: [4:84.20%] [5:100.00%] [6:-812.90%] % Reduction in Volume: [4:94.90%] [5:100.00%] [6:-1800.00%] Classification: [4:Full Thickness Without Exposed Support Structures] [5:Full Thickness Without Exposed Support Structures] [6:Full Thickness Without Exposed Support Structures] Exudate Amount: [4:Small] [5:N/A]  [6:Medium] Exudate Type: [4:N/A] [5:N/A] [6:Serosanguineous] Exudate Color: [4:N/A] [5:N/A] [6:red, brown] Wound Margin: [4:Flat and Intact] [5:N/A] [6:Distinct, outline attached] Granulation Amount: [4:Medium (34-66%)] [5:N/A] [6:None Present (0%)] Granulation Quality: [4:Red] [5:N/A] [6:N/A] Necrotic Amount: [4:Medium (34-66%)] [5:N/A] [6:Large (67-100%)] Necrotic Tissue: [4:Adherent Slough] [5:N/A] [6:Eschar] Exposed Structures: [4:Fascia: No Fat Layer (Subcutaneous Tissue) Exposed: No Tendon: No Muscle: No Joint: No Bone: No] [5:N/A] [6:Fascia: No Fat Layer (Subcutaneous Tissue) Exposed: No Tendon: No Muscle: No Joint: No Bone: No] Epithelialization: [4:None] [5:N/A] [6:None] Debridement: [4:N/A] [5:N/A] [6:Debridement - Excisional] Pre-procedure N/A N/A 15:05 Verification/Time Out Taken: Pain Control: N/A N/A Lidocaine 4% Topical Solution Tissue Debrided: N/A N/A Necrotic/Eschar, Subcutaneous, Slough Level: N/A N/A Skin/Subcutaneous Tissue Debridement Area (sq cm): N/A N/A 0.36 Instrument: N/A N/A Curette Bleeding: N/A N/A Minimum Hemostasis Achieved: N/A N/A Pressure Procedural Pain: N/A N/A 0 Post Procedural Pain: N/A N/A 0 Debridement Treatment N/A N/A Procedure was tolerated well Response: Post Debridement N/A N/A 0.6x0.6x0.2 Measurements L x W x D (cm) Post Debridement Volume: N/A N/A 0.057 (cm) Periwound Skin Texture: No Abnormalities Noted No Abnormalities Noted No Abnormalities Noted Periwound Skin Moisture: No Abnormalities Noted No Abnormalities Noted No Abnormalities Noted Periwound Skin Color: No Abnormalities Noted No Abnormalities Noted No Abnormalities Noted Temperature: No Abnormality N/A No Abnormality Tenderness on Palpation: Yes No Yes Wound Preparation: Ulcer Cleansing: N/A Ulcer Cleansing: Rinsed/Irrigated with Saline, Rinsed/Irrigated with Saline, Other: soap and water Other: soap and water Topical Anesthetic Applied: Topical Anesthetic  Applied: Other: lidocaine 4% Other: lidocaine 4% Procedures Performed: N/A N/A Debridement Wound Number: 8 N/A N/A Photos: No Photos N/A N/A Wound Location: Right [name] - Lateral N/A N/A Wounding Event: Gradually Appeared N/A N/A Primary Etiology: To be determined N/A N/A Comorbid History: Congestive Heart Failure, N/A N/A Coronary Artery Disease, Hypertension, Myocardial Infarction, Osteoarthritis Date Acquired: 04/29/2017 N/A N/A Weeks of Treatment: 1 N/A N/A Wound Status: Open N/A N/A Measurements L x W x D 0.4x0.5x0.1 N/A N/A (cm) Area (cm) : 0.157 N/A N/A Volume (cm) : 0.016 N/A N/A % Reduction in Area: -121.10% N/A N/A % Reduction in Volume: -128.60% N/A N/A Classification: Partial Thickness N/A N/A Exudate Amount: Medium N/A N/A Exudate Type: Serosanguineous N/A N/A Exudate Color: red, brown N/A N/A Wound Margin: Flat and Intact N/A N/A Granulation Amount: None Present (0%) N/A N/A Granulation Quality: N/A N/A N/A [name], Jermaine E. ([id number]) Necrotic Amount: Large (67-100%) N/A N/A Necrotic Tissue: Eschar N/A N/A Exposed Structures: Fascia: No N/A N/A Fat Layer (Subcutaneous Tissue) Exposed: No Tendon: No Muscle: No Joint: No Bone: No Epithelialization: Large (67-100%) N/A N/A Debridement: Debridement - Excisional N/A N/A Pre-procedure 15:07 N/A N/A Verification/Time Out Taken: Pain Control: Lidocaine 4% Topical Solution N/A N/A Tissue Debrided: Necrotic/Eschar, N/A N/A Subcutaneous, Slough Level: Skin/Subcutaneous Tissue N/A N/A Debridement Area (sq cm): 0.2 N/A N/A Instrument: Curette N/A  N/A Bleeding: Minimum N/A N/A Hemostasis Achieved: Pressure N/A N/A Procedural Pain: 0 N/A N/A Post Procedural Pain: 0 N/A N/A Debridement Treatment Procedure was tolerated well N/A N/A Response: Post Debridement 0.4x0.5x0.2 N/A N/A Measurements L x W x D (cm) Post Debridement Volume: 0.031 N/A N/A (cm) Periwound Skin Texture: Excoriation: No N/A  N/A Induration: No Callus: No Crepitus: No Rash: No Scarring: No Periwound Skin Moisture: Maceration: No N/A N/A Dry/Scaly: No Periwound Skin Color: Atrophie Blanche: No N/A N/A Cyanosis: No Ecchymosis: No Erythema: No Hemosiderin Staining: No Mottled: No Pallor: No Rubor: No Temperature: No Abnormality N/A N/A Tenderness on Palpation: No N/A N/A Wound Preparation: Ulcer Cleansing: N/A N/A Rinsed/Irrigated with Saline, Other: soap and water Topical Anesthetic Applied: Other: lidocaine 4% Procedures Performed: Debridement N/A N/A Treatment Notes [name], Jermaine MENZER. ([id number]) Wound #4 (Right, Proximal, Lateral Lower Leg) 1. Cleansed with: Clean wound with Normal Saline 2. Anesthetic Topical Lidocaine 4% cream to wound bed prior to debridement 4. Dressing Applied: Prisma Ag 7. Secured with 3 Layer Compression System - Right Lower Extremity Wound #6 (Right, Lateral Malleolus) 1. Cleansed with: Clean wound with Normal Saline 2. Anesthetic Topical Lidocaine 4% cream to wound bed prior to debridement 4. Dressing Applied: Prisma Ag 7. Secured with 3 Layer Compression System - Right Lower Extremity Wound #8 (Right, Lateral [name]) 1. Cleansed with: Clean wound with Normal Saline 2. Anesthetic Topical Lidocaine 4% cream to wound bed prior to debridement 4. Dressing Applied: Prisma Ag 7. Secured with 3 Layer Compression System - Right Lower Extremity Electronic Signature(s) Signed: 05/06/2017 4:28:35 PM By: Linton Ham MD Entered By: Linton Ham on 05/06/2017 16:07:42 [name], [name] ([id number]) -------------------------------------------------------------------------------- [place] Details Patient Name: [name] Date of Service: 05/06/2017 2:15 PM Medical Record Number: [id number] Patient Account Number: [id number] Date of Birth/Sex: 08/23/1940 (76 y.o. M) Treating RN: Montey Hora Primary Care Saud Bail: Cyndi Bender  Other Clinician: Referring Marlys Stegmaier: Cyndi Bender Treating Kary Colaizzi/Extender: Tito Dine in Treatment: 4 Active Inactive ` Abuse / Safety / Falls / Self Care Management Nursing Diagnoses: History of Falls Goals: Patient will remain injury free related to falls Date Initiated: 04/08/2017 Target Resolution Date: 05/08/2017 Goal Status: Active Interventions: Assess fall risk on admission and as needed Notes: ` Orientation to the Wound Care Program Nursing Diagnoses: Knowledge deficit related to the wound healing center program Goals: Patient/caregiver will verbalize understanding of the [place] Program Date Initiated: 04/08/2017 Target Resolution Date: 05/08/2017 Goal Status: Active Interventions: Provide education on orientation to the wound center Notes: ` Soft Tissue Infection Nursing Diagnoses: Impaired tissue integrity Potential for infection: soft tissue Goals: Patient will remain free of wound infection Date Initiated: 04/08/2017 Target Resolution Date: 05/08/2017 Goal Status: Active [name], [name] ([id number]) Interventions: Assess signs and symptoms of infection every visit Notes: ` Wound/Skin Impairment Nursing Diagnoses: Impaired tissue integrity Goals: Ulcer/skin breakdown will heal within 14 weeks Date Initiated: 04/08/2017 Target Resolution Date: 07/20/2017 Goal Status: Active Interventions: Assess patient/caregiver ability to perform ulcer/skin care regimen upon admission and as needed Provide education on ulcer and skin care Treatment Activities: Topical wound management initiated : 04/08/2017 Notes: Electronic Signature(s) Signed: 05/06/2017 4:48:19 PM By: Montey Hora Entered By: Montey Hora on 05/06/2017 15:06:10 Age, [name] ([id number]) -------------------------------------------------------------------------------- Pain Assessment Details Patient Name: [name] Date of Service: 05/06/2017 2:15  PM Medical Record Number: [id number] Patient Account Number: [id number] Date of Birth/Sex: [birth year] (76 y.o. M) Treating RN: Ahmed Prima Primary Care Taiyo Kozma: Cyndi Bender Other  Clinician: Referring Adora Yeh: Cyndi Bender Treating Percival Glasheen/Extender: Tito Dine in Treatment: 4 Active Problems Location of Pain Severity and Description of Pain Patient Has Paino No Site Locations Pain Management and Medication Current Pain Management: Electronic Signature(s) Signed: 05/06/2017 4:27:21 PM By: Alric Quan Entered By: Alric Quan on 05/06/2017 14:30:28 Camargo, [name] ([id number]) -------------------------------------------------------------------------------- Patient/Caregiver Education Details Patient Name: [name] Date of Service: 05/06/2017 2:15 PM Medical Record Number: [id number] Patient Account Number: [id number] Date of Birth/Gender: Jun 24, 1940 (76 y.o. M) Treating RN: Roger Shelter Primary Care Physician: Cyndi Bender Other Clinician: Referring Physician: Cyndi Bender Treating Physician/Extender: Tito Dine in Treatment: 4 Education Assessment Education Provided To: Patient Education Topics Provided Wound Debridement: Handouts: Wound Debridement Methods: Explain/Verbal Responses: State content correctly Wound/Skin Impairment: Handouts: Caring for Your Ulcer Methods: Explain/Verbal Responses: State content correctly Electronic Signature(s) Signed: 05/06/2017 4:21:17 PM By: Roger Shelter Entered By: Roger Shelter on 05/06/2017 15:30:57 [name], [name] ([id number]) -------------------------------------------------------------------------------- Wound Assessment Details Patient Name: [name] Date of Service: 05/06/2017 2:15 PM Medical Record Number: [id number] Patient Account Number: [id number] Date of Birth/Sex: November 19, 1940 (76 y.o. M) Treating RN: Ahmed Prima Primary Care Barbee Mamula:  Cyndi Bender Other Clinician: Referring Seabron Iannello: Cyndi Bender Treating Dempsey Ahonen/Extender: Tito Dine in Treatment: 4 Wound Status Wound Number: 4 Primary Arterial Insufficiency Ulcer Etiology: Wound Location: Right Lower Leg - Lateral, Proximal Wound Open Wounding Event: Gradually Appeared Status: Date Acquired: 04/08/2017 Comorbid Congestive Heart Failure, Coronary Artery Weeks Of Treatment: 4 History: Disease, Hypertension, Myocardial Infarction, Clustered Wound: No Osteoarthritis Photos Photo Uploaded By: Alric Quan on 05/06/2017 16:21:05 Wound Measurements Length: (cm) 0.2 Width: (cm) 0.2 Depth: (cm) 0.1 Area: (cm) 0.031 Volume: (cm) 0.003 % Reduction in Area: 84.2% % Reduction in Volume: 94.9% Epithelialization: None Tunneling: No Undermining: No Wound Description Full Thickness Without Exposed Support Classification: Structures Wound Margin: Flat and Intact Exudate Small Amount: Foul Odor After Cleansing: No Slough/Fibrino Yes Wound Bed Granulation Amount: Medium (34-66%) Exposed Structure Granulation Quality: Red Fascia Exposed: No Necrotic Amount: Medium (34-66%) Fat Layer (Subcutaneous Tissue) Exposed: No Necrotic Quality: Adherent Slough Tendon Exposed: No Muscle Exposed: No Joint Exposed: No Bone Exposed: No Periwound Skin Texture Texture Color No Abnormalities Noted: No No Abnormalities Noted: No Martindelcampo, Jermaine E. ([id number]) Moisture Temperature / Pain No Abnormalities Noted: No Temperature: No Abnormality Tenderness on Palpation: Yes Wound Preparation Ulcer Cleansing: Rinsed/Irrigated with Saline, Other: soap and water, Topical Anesthetic Applied: Other: lidocaine 4%, Treatment Notes Wound #4 (Right, Proximal, Lateral Lower Leg) 1. Cleansed with: Clean wound with Normal Saline 2. Anesthetic Topical Lidocaine 4% cream to wound bed prior to debridement 4. Dressing Applied: Prisma Ag 7. Secured with 3  Layer Compression System - Right Lower Extremity Electronic Signature(s) Signed: 05/06/2017 4:27:21 PM By: Alric Quan Entered By: Alric Quan on 05/06/2017 14:42:43 [name], [name] ([id number]) -------------------------------------------------------------------------------- Wound Assessment Details Patient Name: [name] Date of Service: 05/06/2017 2:15 PM Medical Record Number: [id number] Patient Account Number: [id number] Date of Birth/Sex: September 14, 1940 (76 y.o. M) Treating RN: Ahmed Prima Primary Care Cezar Misiaszek: Cyndi Bender Other Clinician: Referring Latanza Pfefferkorn: Cyndi Bender Treating Roshelle Traub/Extender: Tito Dine in Treatment: 4 Wound Status Wound Number: 5 Primary Etiology: Arterial Insufficiency Ulcer Wound Location: Right, Distal, Lateral Lower Leg Wound Status: Healed - Epithelialized Wounding Event: Gradually Appeared Date Acquired: 01/15/2017 Weeks Of Treatment: 4 Clustered Wound: No Photos Photo Uploaded By: Alric Quan on 05/06/2017 16:21:05 Wound Measurements Length: (cm) 0 Width: (cm) 0 Depth: (cm) 0 Area: (cm) 0  Volume: (cm) 0 % Reduction in Area: 100% % Reduction in Volume: 100% Wound Description Full Thickness Without Exposed Support Classification: Structures Periwound Skin Texture Texture Color No Abnormalities Noted: No No Abnormalities Noted: No Moisture No Abnormalities Noted: No Electronic Signature(s) Signed: 05/06/2017 4:27:21 PM By: Alric Quan Entered By: Alric Quan on 05/06/2017 14:43:30 [name], [name] ([id number]) -------------------------------------------------------------------------------- Wound Assessment Details Patient Name: [name] Date of Service: 05/06/2017 2:15 PM Medical Record Number: [id number] Patient Account Number: [id number] Date of Birth/Sex: 07/19/1940 (76 y.o. M) Treating RN: Ahmed Prima Primary Care Arynn Armand: Cyndi Bender Other  Clinician: Referring Roniqua Kintz: Cyndi Bender Treating Margean Korell/Extender: Tito Dine in Treatment: 4 Wound Status Wound Number: 6 Primary Arterial Insufficiency Ulcer Etiology: Wound Location: Right Malleolus - Lateral Wound Open Wounding Event: Gradually Appeared Status: Date Acquired: 03/23/2017 Comorbid Congestive Heart Failure, Coronary Artery Weeks Of Treatment: 4 History: Disease, Hypertension, Myocardial Infarction, Clustered Wound: No Osteoarthritis Photos Photo Uploaded By: Alric Quan on 05/06/2017 16:21:46 Wound Measurements Length: (cm) 0.6 Width: (cm) 0.6 Depth: (cm) 0.2 Area: (cm) 0.283 Volume: (cm) 0.057 % Reduction in Area: -812.9% % Reduction in Volume: -1800% Epithelialization: None Tunneling: No Undermining: No Wound Description Full Thickness Without Exposed Support Classification: Structures Wound Margin: Distinct, outline attached Exudate Medium Amount: Exudate Type: Serosanguineous Exudate Color: red, brown Foul Odor After Cleansing: No Slough/Fibrino Yes Wound Bed Granulation Amount: None Present (0%) Exposed Structure Necrotic Amount: Large (67-100%) Fascia Exposed: No Necrotic Quality: Eschar Fat Layer (Subcutaneous Tissue) Exposed: No Tendon Exposed: No Muscle Exposed: No Joint Exposed: No Bone Exposed: No Periwound Skin Texture Texture Color Wilden, Jermaine E. ([id number]) No Abnormalities Noted: No No Abnormalities Noted: No Moisture Temperature / Pain No Abnormalities Noted: No Temperature: No Abnormality Tenderness on Palpation: Yes Wound Preparation Ulcer Cleansing: Rinsed/Irrigated with Saline, Other: soap and water, Topical Anesthetic Applied: Other: lidocaine 4%, Treatment Notes Wound #6 (Right, Lateral Malleolus) 1. Cleansed with: Clean wound with Normal Saline 2. Anesthetic Topical Lidocaine 4% cream to wound bed prior to debridement 4. Dressing Applied: Prisma Ag 7. Secured with 3  Layer Compression System - Right Lower Extremity Electronic Signature(s) Signed: 05/06/2017 4:27:21 PM By: Alric Quan Entered By: Alric Quan on 05/06/2017 14:45:06 [name], [name] ([id number]) -------------------------------------------------------------------------------- Wound Assessment Details Patient Name: [name] Date of Service: 05/06/2017 2:15 PM Medical Record Number: [id number] Patient Account Number: [id number] Date of Birth/Sex: 01-16-1940 (76 y.o. M) Treating RN: Ahmed Prima Primary Care Karlen Barbar: Cyndi Bender Other Clinician: Referring Kyrstin Campillo: Cyndi Bender Treating Ruhan Borak/Extender: Tito Dine in Treatment: 4 Wound Status Wound Number: 8 Primary To be determined Etiology: Wound Location: Right [name] - Lateral Wound Open Wounding Event: Gradually Appeared Status: Date Acquired: 04/29/2017 Comorbid Congestive Heart Failure, Coronary Artery Weeks Of Treatment: 1 History: Disease, Hypertension, Myocardial Infarction, Clustered Wound: No Osteoarthritis Photos Photo Uploaded By: Alric Quan on 05/06/2017 16:21:47 Wound Measurements Length: (cm) 0.4 Width: (cm) 0.5 Depth: (cm) 0.1 Area: (cm) 0.157 Volume: (cm) 0.016 % Reduction in Area: -121.1% % Reduction in Volume: -128.6% Epithelialization: Large (67-100%) Tunneling: No Undermining: No Wound Description Classification: Partial Thickness Wound Margin: Flat and Intact Exudate Amount: Medium Exudate Type: Serosanguineous Exudate Color: red, brown Foul Odor After Cleansing: No Slough/Fibrino Yes Wound Bed Granulation Amount: None Present (0%) Exposed Structure Necrotic Amount: Large (67-100%) Fascia Exposed: No Necrotic Quality: Eschar Fat Layer (Subcutaneous Tissue) Exposed: No Tendon Exposed: No Muscle Exposed: No Joint Exposed: No Bone Exposed: No Periwound Skin Texture Texture Color No Abnormalities Noted: No No Abnormalities  Noted:  No [name], [name] ([id number]) Callus: No Atrophie Blanche: No Crepitus: No Cyanosis: No Excoriation: No Ecchymosis: No Induration: No Erythema: No Rash: No Hemosiderin Staining: No Scarring: No Mottled: No Pallor: No Moisture Rubor: No No Abnormalities Noted: No Dry / Scaly: No Temperature / Pain Maceration: No Temperature: No Abnormality Wound Preparation Ulcer Cleansing: Rinsed/Irrigated with Saline, Other: soap and water, Topical Anesthetic Applied: Other: lidocaine 4%, Treatment Notes Wound #8 (Right, Lateral [name]) 1. Cleansed with: Clean wound with Normal Saline 2. Anesthetic Topical Lidocaine 4% cream to wound bed prior to debridement 4. Dressing Applied: Prisma Ag 7. Secured with 3 Layer Compression System - Right Lower Extremity Electronic Signature(s) Signed: 05/06/2017 4:27:21 PM By: Alric Quan Entered By: Alric Quan on 05/06/2017 14:46:21 [name], [name] ([id number]) -------------------------------------------------------------------------------- Vitals Details Patient Name: [name] Date of Service: 05/06/2017 2:15 PM Medical Record Number: [id number] Patient Account Number: [id number] Date of Birth/Sex: Sep 14, 1940 (76 y.o. M) Treating RN: Ahmed Prima Primary Care Ramal Eckhardt: Cyndi Bender Other Clinician: Referring Agron Swiney: Cyndi Bender Treating Jermaine Arzola/Extender: Tito Dine in Treatment: 4 Vital Signs Time Taken: 14:30 Temperature (F): 97.6 Height (in): 68 Pulse (bpm): 67 Respiratory Rate (breaths/min): 18 Blood Pressure (mmHg): 82/69 Reference Range: 80 - 120 mg / dl Notes Took BP manually and it was 92/54 Electronic Signature(s) Signed: 05/06/2017 4:27:21 PM By: Alric Quan Entered By: Alric Quan on 05/06/2017 14:35:53

## 2017-05-11 DIAGNOSIS — I2510 Atherosclerotic heart disease of native coronary artery without angina pectoris: Secondary | ICD-10-CM | POA: Diagnosis not present

## 2017-05-11 DIAGNOSIS — I255 Ischemic cardiomyopathy: Secondary | ICD-10-CM | POA: Diagnosis not present

## 2017-05-11 DIAGNOSIS — I110 Hypertensive heart disease with heart failure: Secondary | ICD-10-CM | POA: Diagnosis not present

## 2017-05-11 DIAGNOSIS — M069 Rheumatoid arthritis, unspecified: Secondary | ICD-10-CM | POA: Diagnosis not present

## 2017-05-11 DIAGNOSIS — S81811D Laceration without foreign body, right lower leg, subsequent encounter: Secondary | ICD-10-CM | POA: Diagnosis not present

## 2017-05-11 DIAGNOSIS — I5023 Acute on chronic systolic (congestive) heart failure: Secondary | ICD-10-CM | POA: Diagnosis not present

## 2017-05-13 ENCOUNTER — Encounter: Payer: Medicare Other | Admitting: Internal Medicine

## 2017-05-13 DIAGNOSIS — I255 Ischemic cardiomyopathy: Secondary | ICD-10-CM | POA: Diagnosis not present

## 2017-05-13 DIAGNOSIS — L97515 Non-pressure chronic ulcer of other part of right foot with muscle involvement without evidence of necrosis: Secondary | ICD-10-CM | POA: Diagnosis not present

## 2017-05-13 DIAGNOSIS — L97312 Non-pressure chronic ulcer of right ankle with fat layer exposed: Secondary | ICD-10-CM | POA: Diagnosis not present

## 2017-05-13 DIAGNOSIS — I472 Ventricular tachycardia: Secondary | ICD-10-CM | POA: Diagnosis not present

## 2017-05-13 DIAGNOSIS — I70235 Atherosclerosis of native arteries of right leg with ulceration of other part of foot: Secondary | ICD-10-CM | POA: Diagnosis not present

## 2017-05-13 DIAGNOSIS — L97211 Non-pressure chronic ulcer of right calf limited to breakdown of skin: Secondary | ICD-10-CM | POA: Diagnosis not present

## 2017-05-13 DIAGNOSIS — L97521 Non-pressure chronic ulcer of other part of left foot limited to breakdown of skin: Secondary | ICD-10-CM | POA: Diagnosis not present

## 2017-05-13 DIAGNOSIS — L97519 Non-pressure chronic ulcer of other part of right foot with unspecified severity: Secondary | ICD-10-CM | POA: Diagnosis not present

## 2017-05-13 DIAGNOSIS — I70233 Atherosclerosis of native arteries of right leg with ulceration of ankle: Secondary | ICD-10-CM | POA: Diagnosis not present

## 2017-05-13 DIAGNOSIS — L97512 Non-pressure chronic ulcer of other part of right foot with fat layer exposed: Secondary | ICD-10-CM | POA: Diagnosis not present

## 2017-05-15 ENCOUNTER — Other Ambulatory Visit: Payer: Self-pay | Admitting: Neurology

## 2017-05-15 DIAGNOSIS — R29898 Other symptoms and signs involving the musculoskeletal system: Secondary | ICD-10-CM | POA: Diagnosis not present

## 2017-05-15 DIAGNOSIS — M545 Low back pain, unspecified: Secondary | ICD-10-CM

## 2017-05-15 DIAGNOSIS — G8929 Other chronic pain: Secondary | ICD-10-CM | POA: Diagnosis not present

## 2017-05-16 DIAGNOSIS — I110 Hypertensive heart disease with heart failure: Secondary | ICD-10-CM | POA: Diagnosis not present

## 2017-05-16 DIAGNOSIS — I2510 Atherosclerotic heart disease of native coronary artery without angina pectoris: Secondary | ICD-10-CM | POA: Diagnosis not present

## 2017-05-16 DIAGNOSIS — I5023 Acute on chronic systolic (congestive) heart failure: Secondary | ICD-10-CM | POA: Diagnosis not present

## 2017-05-16 DIAGNOSIS — M069 Rheumatoid arthritis, unspecified: Secondary | ICD-10-CM | POA: Diagnosis not present

## 2017-05-16 DIAGNOSIS — I255 Ischemic cardiomyopathy: Secondary | ICD-10-CM | POA: Diagnosis not present

## 2017-05-16 DIAGNOSIS — S81811D Laceration without foreign body, right lower leg, subsequent encounter: Secondary | ICD-10-CM | POA: Diagnosis not present

## 2017-05-16 NOTE — Progress Notes (Signed)
KAISEN, ACKERS ([id number]) Visit Report for 05/13/2017 Arrival Information Details Patient Name: [name], [name] Date of Service: 05/13/2017 9:30 AM Medical Record Number: [id number] Patient Account Number: [id number] Date of Birth/Sex: [birth year] (77 y.o. M) Treating RN: Ahmed Prima Primary Care Darcey Cardy: Cyndi Bender Other Clinician: Referring Vallie Teters: Cyndi Bender Treating Kena Limon/Extender: Tito Dine in Treatment: 5 Visit Information History Since Last Visit All ordered tests and consults were completed: No Patient Arrived: Cane Added or deleted any medications: No Arrival Time: 09:33 Any new allergies or adverse reactions: No Accompanied By: spouse Had a fall or experienced change in No Transfer Assistance: EasyPivot Patient activities of daily living that may affect Lift risk of falls: Patient Identification Verified: Yes Signs or symptoms of abuse/neglect since last visito No Secondary Verification Process Yes Hospitalized since last visit: No Completed: Implantable device outside of the clinic excluding No Patient Requires Transmission-Based No cellular tissue based products placed in the center Precautions: since last visit: Patient Has Alerts: Yes Has Dressing in Place as Prescribed: Yes Patient Alerts: R ABI >220 Has Compression in Place as Prescribed: Yes Pain Present Now: No Electronic Signature(s) Signed: 05/13/2017 4:48:17 PM By: Alric Quan Entered By: Alric Quan on 05/13/2017 09:34:52 [place], [name] ([id number]) -------------------------------------------------------------------------------- Encounter Discharge Information Details Patient Name: [name] Date of Service: 05/13/2017 9:30 AM Medical Record Number: [id number] Patient Account Number: [id number] Date of Birth/Sex: 22-Feb-1940 (77 y.o. M) Treating RN: Montey Hora Primary Care Deronte Solis: Cyndi Bender Other Clinician: Referring Maryem Shuffler: Cyndi Bender Treating Yoshio Seliga/Extender: Tito Dine in Treatment: 5 Encounter Discharge Information Items Discharge Condition: Stable Ambulatory Status: Cane Discharge Destination: Home Transportation: Private Auto Accompanied By: spouse Schedule Follow-up Appointment: Yes Clinical Summary of Care: Electronic Signature(s) Signed: 05/13/2017 10:43:00 AM By: Montey Hora Entered By: Montey Hora on 05/13/2017 10:43:00 [place], [name] ([id number]) -------------------------------------------------------------------------------- Lower Extremity Assessment Details Patient Name: [name] Date of Service: 05/13/2017 9:30 AM Medical Record Number: [id number] Patient Account Number: [id number] Date of Birth/Sex: Oct 30, 1940 (77 y.o. M) Treating RN: Ahmed Prima Primary Care Deborahann Poteat: Cyndi Bender Other Clinician: Referring Julien Berryman: Cyndi Bender Treating Arien Benincasa/Extender: Tito Dine in Treatment: 5 Edema Assessment Assessed: [Left: No] [Right: No] [Left: Edema] [Right: :] Calf Left: Right: Point of Measurement: 27 cm From Medial Instep 29 cm cm Ankle Left: Right: Point of Measurement: 11 cm From Medial Instep 21.4 cm cm Vascular Assessment Pulses: Dorsalis Pedis Palpable: [Left:Yes] Posterior Tibial Extremity colors, hair growth, and conditions: Extremity Color: [Left:Normal] Temperature of Extremity: [Left:Warm] Capillary Refill: [Left:< 3 seconds] Toe Nail Assessment Left: Right: Thick: Yes Discolored: Yes Deformed: Yes Improper Length and Hygiene: Yes Electronic Signature(s) Signed: 05/13/2017 4:48:17 PM By: Alric Quan Entered By: Alric Quan on 05/13/2017 09:47:14 [place]ag[place], [name] ([id number]) -------------------------------------------------------------------------------- Multi Wound Chart Details Patient Name: [name] Date of Service: 05/13/2017 9:30 AM Medical Record Number: [id number] Patient Account  Number: [id number] Date of Birth/Sex: [birth year] (77 y.o. M) Treating RN: Cornell Barman Primary Care Halaina Vanduzer: Cyndi Bender Other Clinician: Referring Tong Pieczynski: Cyndi Bender Treating Dontrey Snellgrove/Extender: Tito Dine in Treatment: 5 Vital Signs Height(in): 68 Pulse(bpm): 70 Weight(lbs): Blood Pressure(mmHg): 84/48 Body Mass Index(BMI): Temperature(F): 97.6 Respiratory Rate 16 (breaths/min): Photos: [4:No Photos] [6:No Photos] [8:No Photos] Wound Location: [4:Right Lower Leg - Lateral, Proximal] [6:Right Malleolus - Lateral] [8:Right [name] - Lateral] Wounding Event: [4:Gradually Appeared] [6:Gradually Appeared] [8:Gradually Appeared] Primary Etiology: [4:Arterial Insufficiency Ulcer] [6:Arterial Insufficiency Ulcer] [8:Arterial Insufficiency Ulcer] Comorbid History: [4:Congestive Heart Failure, Coronary Artery Disease, Hypertension,  Myocardial Infarction, Osteoarthritis] [6:Congestive Heart Failure, Coronary Artery Disease, Hypertension, Myocardial Infarction, Osteoarthritis] [8:Congestive Heart  Failure, Coronary Artery Disease, Hypertension, Myocardial Infarction, Osteoarthritis] Date Acquired: [4:04/08/2017] [6:03/23/2017] [8:04/29/2017] Weeks of Treatment: [4:5] [6:5] [8:2] Wound Status: [4:Open] [6:Open] [8:Open] Measurements L x W x D [4:0.4x0.2x0.1] [6:0.7x0.7x0.2] [8:0.6x0.8x0.1] (cm) Area (cm) : [4:0.063] [6:0.385] [8:0.377] Volume (cm) : [4:0.006] [6:0.077] [8:0.038] % Reduction in Area: [4:67.90%] [6:-1141.90%] [8:-431.00%] % Reduction in Volume: [4:89.80%] [6:-2466.70%] [8:-442.90%] Classification: [4:Full Thickness Without Exposed Support Structures] [6:Full Thickness Without Exposed Support Structures] [8:Partial Thickness] Exudate Amount: [4:Small] [6:Medium] [8:Medium] Exudate Type: [4:Serosanguineous] [6:Serosanguineous] [8:Serosanguineous] Exudate Color: [4:red, brown] [6:red, brown] [8:red, brown] Wound Margin: [4:Flat and Intact] [6:Distinct, outline  attached] [8:Flat and Intact] Granulation Amount: [4:None Present (0%)] [6:None Present (0%)] [8:None Present (0%)] Necrotic Amount: [4:Large (67-100%)] [6:Large (67-100%)] [8:Large (67-100%)] Necrotic Tissue: [4:Eschar, Adherent Slough] [6:Eschar, Adherent Slough] [8:Eschar] Exposed Structures: [4:Fascia: No Fat Layer (Subcutaneous Tissue) Exposed: No Tendon: No Muscle: No Joint: No Bone: No] [6:Fascia: No Fat Layer (Subcutaneous Tissue) Exposed: No Tendon: No Muscle: No Joint: No Bone: No] [8:Fascia: No Fat Layer (Subcutaneous Tissue) Exposed:  No Tendon: No Muscle: No Joint: No Bone: No] Epithelialization: [4:None] [6:None] [8:Large (67-100%)] Debridement: [4:N/A N/A] [6:Debridement - Excisional 10:08] [8:Debridement - Excisional 10:08] Pre-procedure Verification/Time Out Taken: Pain Control: N/A Other Other Tissue Debrided: N/A Subcutaneous Muscle, Subcutaneous Level: N/A Skin/Subcutaneous Tissue Skin/Subcutaneous Tissue/Muscle Debridement Area (sq cm): N/A 0.49 0.48 Instrument: N/A Curette Curette Bleeding: N/A Minimum Minimum Hemostasis Achieved: N/A Pressure Pressure Procedural Pain: N/A 0 0 Post Procedural Pain: N/A 0 0 Debridement Treatment N/A Procedure was tolerated well Procedure was tolerated well Response: Post Debridement N/A 0.7x0.7x0.3 0.6x0.8x0.5 Measurements L x W x D (cm) Post Debridement Volume: N/A 0.115 0.188 (cm) Periwound Skin Texture: No Abnormalities Noted No Abnormalities Noted Excoriation: No Induration: No Callus: No Crepitus: No Rash: No Scarring: No Periwound Skin Moisture: No Abnormalities Noted No Abnormalities Noted Maceration: No Dry/Scaly: No Periwound Skin Color: No Abnormalities Noted No Abnormalities Noted Erythema: Yes Atrophie Blanche: No Cyanosis: No Ecchymosis: No Hemosiderin Staining: No Mottled: No Pallor: No Rubor: No Erythema Location: N/A N/A Circumferential Temperature: No Abnormality No Abnormality No  Abnormality Tenderness on Palpation: Yes Yes No Wound Preparation: Ulcer Cleansing: Ulcer Cleansing: Ulcer Cleansing: Rinsed/Irrigated with Saline, Rinsed/Irrigated with Saline, Rinsed/Irrigated with Saline, Other: soap and water Other: soap and water Other: soap and water Topical Anesthetic Applied: Topical Anesthetic Applied: Topical Anesthetic Applied: Other: lidocaine 4% Other: lidocaine 4% Other: lidocaine 4% Procedures Performed: N/A Debridement Debridement Treatment Notes Wound #4 (Right, Proximal, Lateral Lower Leg) 1. Cleansed with: Clean wound with Normal Saline Cleanse wound with antibacterial soap and water 2. Anesthetic Topical Lidocaine 4% cream to wound bed prior to debridement 3. Peri-wound Care: Other peri-wound care (specify in notes) 4. Dressing Applied: Prisma Ag KIP, CROPP. ([id number]) 5. Secondary Dressing Applied ABD Pad 7. Secured with 3 Layer Compression System - Right Lower Extremity Notes TCA Wound #6 (Right, Lateral Malleolus) 1. Cleansed with: Clean wound with Normal Saline Cleanse wound with antibacterial soap and water 2. Anesthetic Topical Lidocaine 4% cream to wound bed prior to debridement 3. Peri-wound Care: Other peri-wound care (specify in notes) 4. Dressing Applied: Prisma Ag 5. Secondary Dressing Applied ABD Pad 7. Secured with 3 Layer Compression System - Right Lower Extremity Notes TCA Wound #8 (Right, Lateral [name]) 1. Cleansed with: Clean wound with Normal Saline Cleanse wound with antibacterial soap and water 2. Anesthetic Topical Lidocaine 4% cream to wound bed prior to  debridement 3. Peri-wound Care: Other peri-wound care (specify in notes) 4. Dressing Applied: Prisma Ag 5. Secondary Dressing Applied ABD Pad 7. Secured with 3 Layer Compression System - Right Lower Extremity Notes TCA Electronic Signature(s) Signed: 05/14/2017 1:16:01 PM By: Linton Ham MD Entered By: Linton Ham on 05/13/2017  13:19:19 [place], [name] ([id number]) -------------------------------------------------------------------------------- Multi-Disciplinary Care Plan Details Patient Name: [name] Date of Service: 05/13/2017 9:30 AM Medical Record Number: [id number] Patient Account Number: [id number] Date of Birth/Sex: 1940-06-11 (77 y.o. M) Treating RN: Cornell Barman Primary Care Daleisa Halperin: Cyndi Bender Other Clinician: Referring Aili Casillas: Cyndi Bender Treating Darchelle Nunes/Extender: Tito Dine in Treatment: 5 Active Inactive ` Abuse / Safety / Falls / Self Care Management Nursing Diagnoses: History of Falls Goals: Patient will remain injury free related to falls Date Initiated: 04/08/2017 Target Resolution Date: 05/08/2017 Goal Status: Active Interventions: Assess fall risk on admission and as needed Notes: ` Orientation to the Wound Care Program Nursing Diagnoses: Knowledge deficit related to the wound healing center program Goals: Patient/caregiver will verbalize understanding of the [facility] Date Initiated: 04/08/2017 Target Resolution Date: 05/08/2017 Goal Status: Active Interventions: Provide education on orientation to the wound center Notes: ` Soft Tissue Infection Nursing Diagnoses: Impaired tissue integrity Potential for infection: soft tissue Goals: Patient will remain free of wound infection Date Initiated: 04/08/2017 Target Resolution Date: 05/08/2017 Goal Status: Active JAXSYN, AZAM ([id number]) Interventions: Assess signs and symptoms of infection every visit Notes: ` Wound/Skin Impairment Nursing Diagnoses: Impaired tissue integrity Goals: Ulcer/skin breakdown will heal within 14 weeks Date Initiated: 04/08/2017 Target Resolution Date: 07/20/2017 Goal Status: Active Interventions: Assess patient/caregiver ability to perform ulcer/skin care regimen upon admission and as needed Provide education on ulcer and skin care Treatment  Activities: Topical wound management initiated : 04/08/2017 Notes: Electronic Signature(s) Signed: 05/13/2017 5:47:31 PM By: Gretta Cool, BSN, RN, CWS, Kim RN, BSN Entered By: Gretta Cool, BSN, RN, CWS, Kim on 05/13/2017 10:07:55 [place], [name] ([id number]) -------------------------------------------------------------------------------- Pain Assessment Details Patient Name: [name] Date of Service: 05/13/2017 9:30 AM Medical Record Number: [id number] Patient Account Number: [id number] Date of Birth/Sex: [birth year] (77 y.o. M) Treating RN: Ahmed Prima Primary Care Glema Takaki: Cyndi Bender Other Clinician: Referring Aarion Kittrell: Cyndi Bender Treating Bertram Haddix/Extender: Tito Dine in Treatment: 5 Active Problems Location of Pain Severity and Description of Pain Patient Has Paino No Site Locations Pain Management and Medication Current Pain Management: Electronic Signature(s) Signed: 05/13/2017 4:48:17 PM By: Alric Quan Entered By: Alric Quan on 05/13/2017 09:34:58 [place], [name] ([id number]) -------------------------------------------------------------------------------- Patient/Caregiver Education Details Patient Name: [name] Date of Service: 05/13/2017 9:30 AM Medical Record Number: [id number] Patient Account Number: [id number] Date of Birth/Gender: 12-05-1940 (77 y.o. M) Treating RN: Montey Hora Primary Care Physician: Cyndi Bender Other Clinician: Referring Physician: Cyndi Bender Treating Physician/Extender: Tito Dine in Treatment: 5 Education Assessment Education Provided To: Patient Education Topics Provided Venous: Handouts: Other: leg elevation Methods: Explain/Verbal Responses: State content correctly Electronic Signature(s) Signed: 05/14/2017 4:34:09 PM By: Montey Hora Entered By: Montey Hora on 05/13/2017 10:43:22 Drewes, [name]  ([id number]) -------------------------------------------------------------------------------- Wound Assessment Details Patient Name: [name] Date of Service: 05/13/2017 9:30 AM Medical Record Number: [id number] Patient Account Number: [id number] Date of Birth/Sex: May 05, 1940 (77 y.o. M) Treating RN: Ahmed Prima Primary Care Laasya Peyton: Cyndi Bender Other Clinician: Referring Reese Stockman: Cyndi Bender Treating Jullien Granquist/Extender: Tito Dine in Treatment: 5 Wound Status Wound Number: 4 Primary Arterial Insufficiency Ulcer Etiology: Wound Location: Right Lower Leg - Lateral,  Proximal Wound Open Wounding Event: Gradually Appeared Status: Date Acquired: 04/08/2017 Comorbid Congestive Heart Failure, Coronary Artery Weeks Of Treatment: 5 History: Disease, Hypertension, Myocardial Infarction, Clustered Wound: No Osteoarthritis Photos Photo Uploaded By: Gretta Cool, BSN, RN, CWS, Kim on 05/14/2017 15:46:43 Wound Measurements Length: (cm) 0.4 Width: (cm) 0.2 Depth: (cm) 0.1 Area: (cm) 0.063 Volume: (cm) 0.006 % Reduction in Area: 67.9% % Reduction in Volume: 89.8% Epithelialization: None Tunneling: No Undermining: No Wound Description Full Thickness Without Exposed Support Classification: Structures Wound Margin: Flat and Intact Exudate Small Amount: Exudate Type: Serosanguineous Exudate Color: red, brown Foul Odor After Cleansing: No Slough/Fibrino Yes Wound Bed Granulation Amount: None Present (0%) Exposed Structure Necrotic Amount: Large (67-100%) Fascia Exposed: No Necrotic Quality: Eschar, Adherent Slough Fat Layer (Subcutaneous Tissue) Exposed: No Tendon Exposed: No Muscle Exposed: No Joint Exposed: No Bone Exposed: No Rambeau, Jermaine E. ([id number]) Periwound Skin Texture Texture Color No Abnormalities Noted: No No Abnormalities Noted: No Moisture Temperature / Pain No Abnormalities Noted: No Temperature: No Abnormality Tenderness on  Palpation: Yes Wound Preparation Ulcer Cleansing: Rinsed/Irrigated with Saline, Other: soap and water, Topical Anesthetic Applied: Other: lidocaine 4%, Treatment Notes Wound #4 (Right, Proximal, Lateral Lower Leg) 1. Cleansed with: Clean wound with Normal Saline Cleanse wound with antibacterial soap and water 2. Anesthetic Topical Lidocaine 4% cream to wound bed prior to debridement 3. Peri-wound Care: Other peri-wound care (specify in notes) 4. Dressing Applied: Prisma Ag 5. Secondary Dressing Applied ABD Pad 7. Secured with 3 Layer Compression System - Right Lower Extremity Notes TCA Electronic Signature(s) Signed: 05/13/2017 4:48:17 PM By: Alric Quan Entered By: Alric Quan on 05/13/2017 09:52:32 [place], [name] ([id number]) -------------------------------------------------------------------------------- Wound Assessment Details Patient Name: [name] Date of Service: 05/13/2017 9:30 AM Medical Record Number: [id number] Patient Account Number: [id number] Date of Birth/Sex: [birth year] (77 y.o. M) Treating RN: Ahmed Prima Primary Care Caelin Rosen: Cyndi Bender Other Clinician: Referring Saurav Crumble: Cyndi Bender Treating Laporche Martelle/Extender: Tito Dine in Treatment: 5 Wound Status Wound Number: 6 Primary Arterial Insufficiency Ulcer Etiology: Wound Location: Right Malleolus - Lateral Wound Open Wounding Event: Gradually Appeared Status: Date Acquired: 03/23/2017 Comorbid Congestive Heart Failure, Coronary Artery Weeks Of Treatment: 5 History: Disease, Hypertension, Myocardial Infarction, Clustered Wound: No Osteoarthritis Photos Photo Uploaded By: Gretta Cool, BSN, RN, CWS, Kim on 05/14/2017 15:46:44 Wound Measurements Length: (cm) 0.7 Width: (cm) 0.7 Depth: (cm) 0.2 Area: (cm) 0.385 Volume: (cm) 0.077 % Reduction in Area: -1141.9% % Reduction in Volume: -2466.7% Epithelialization: None Tunneling: No Undermining: No Wound  Description Full Thickness Without Exposed Support Foul Od Classification: Structures Slough/ Wound Margin: Distinct, outline attached Exudate Medium Amount: Exudate Type: Serosanguineous Exudate Color: red, brown or After Cleansing: No Fibrino Yes Wound Bed Granulation Amount: None Present (0%) Exposed Structure Necrotic Amount: Large (67-100%) Fascia Exposed: No Necrotic Quality: Eschar, Adherent Slough Fat Layer (Subcutaneous Tissue) Exposed: No Tendon Exposed: No Muscle Exposed: No Joint Exposed: No Bone Exposed: No Balicki, Jermaine E. ([id number]) Periwound Skin Texture Texture Color No Abnormalities Noted: No No Abnormalities Noted: No Moisture Temperature / Pain No Abnormalities Noted: No Temperature: No Abnormality Tenderness on Palpation: Yes Wound Preparation Ulcer Cleansing: Rinsed/Irrigated with Saline, Other: soap and water, Topical Anesthetic Applied: Other: lidocaine 4%, Treatment Notes Wound #6 (Right, Lateral Malleolus) 1. Cleansed with: Clean wound with Normal Saline Cleanse wound with antibacterial soap and water 2. Anesthetic Topical Lidocaine 4% cream to wound bed prior to debridement 3. Peri-wound Care: Other peri-wound care (specify in notes) 4. Dressing Applied: Prisma Ag 5. Secondary  Dressing Applied ABD Pad 7. Secured with 3 Layer Compression System - Right Lower Extremity Notes TCA Electronic Signature(s) Signed: 05/13/2017 4:48:17 PM By: Alric Quan Entered By: Alric Quan on 05/13/2017 09:51:53 Nong, [name] ([id number]) -------------------------------------------------------------------------------- Wound Assessment Details Patient Name: [name] Date of Service: 05/13/2017 9:30 AM Medical Record Number: [id number] Patient Account Number: [id number] Date of Birth/Sex: 04/30/1940 (77 y.o. M) Treating RN: Ahmed Prima Primary Care Davarion Cuffee: Cyndi Bender Other Clinician: Referring Jowan Skillin: Cyndi Bender Treating Donja Tipping/Extender: Tito Dine in Treatment: 5 Wound Status Wound Number: 8 Primary Arterial Insufficiency Ulcer Etiology: Wound Location: Right [name] - Lateral Wound Open Wounding Event: Gradually Appeared Status: Date Acquired: 04/29/2017 Comorbid Congestive Heart Failure, Coronary Artery Weeks Of Treatment: 2 History: Disease, Hypertension, Myocardial Infarction, Clustered Wound: No Osteoarthritis Photos Photo Uploaded By: Gretta Cool, BSN, RN, CWS, Kim on 05/14/2017 15:47:33 Wound Measurements Length: (cm) 0.6 Width: (cm) 0.8 Depth: (cm) 0.1 Area: (cm) 0.377 Volume: (cm) 0.038 % Reduction in Area: -431% % Reduction in Volume: -442.9% Epithelialization: Large (67-100%) Tunneling: No Undermining: No Wound Description Classification: Partial Thickness Foul Odor Wound Margin: Flat and Intact Slough/Fi Exudate Amount: Medium Exudate Type: Serosanguineous Exudate Color: red, brown After Cleansing: No brino Yes Wound Bed Granulation Amount: None Present (0%) Exposed Structure Necrotic Amount: Large (67-100%) Fascia Exposed: No Necrotic Quality: Eschar Fat Layer (Subcutaneous Tissue) Exposed: No Tendon Exposed: No Muscle Exposed: No Joint Exposed: No Bone Exposed: No Periwound Skin Texture Closs, Jermaine E. ([id number]) Texture Color No Abnormalities Noted: No No Abnormalities Noted: No Callus: No Atrophie Blanche: No Crepitus: No Cyanosis: No Excoriation: No Ecchymosis: No Induration: No Erythema: Yes Rash: No Erythema Location: Circumferential Scarring: No Hemosiderin Staining: No Mottled: No Moisture Pallor: No No Abnormalities Noted: No Rubor: No Dry / Scaly: No Maceration: No Temperature / Pain Temperature: No Abnormality Wound Preparation Ulcer Cleansing: Rinsed/Irrigated with Saline, Other: soap and water, Topical Anesthetic Applied: Other: lidocaine 4%, Treatment Notes Wound #8 (Right, Lateral [name]) 1. Cleansed  with: Clean wound with Normal Saline Cleanse wound with antibacterial soap and water 2. Anesthetic Topical Lidocaine 4% cream to wound bed prior to debridement 3. Peri-wound Care: Other peri-wound care (specify in notes) 4. Dressing Applied: Prisma Ag 5. Secondary Dressing Applied ABD Pad 7. Secured with 3 Layer Compression System - Right Lower Extremity Notes TCA Electronic Signature(s) Signed: 05/13/2017 4:48:17 PM By: Alric Quan Entered By: Alric Quan on 05/13/2017 09:53:47 Blake, [name] ([id number]) -------------------------------------------------------------------------------- Vitals Details Patient Name: [name] Date of Service: 05/13/2017 9:30 AM Medical Record Number: [id number] Patient Account Number: [id number] Date of Birth/Sex: 1940/10/13 (77 y.o. M) Treating RN: Ahmed Prima Primary Care Vonzella Althaus: Cyndi Bender Other Clinician: Referring Aleksia Freiman: Cyndi Bender Treating [place] Luppino/Extender: Tito Dine in Treatment: 5 Vital Signs Time Taken: 09:35 Temperature (F): 97.6 Height (in): 68 Pulse (bpm): 70 Respiratory Rate (breaths/min): 16 Blood Pressure (mmHg): 84/48 Reference Range: 80 - 120 mg / dl Notes took BP manually and it was 90/58 made MD Dellia Nims aware of same. Electronic Signature(s) Signed: 05/13/2017 4:48:17 PM By: Alric Quan Entered By: Alric Quan on 05/13/2017 09:38:56

## 2017-05-16 NOTE — Progress Notes (Signed)
[name] ([id number]) Visit Report for 05/13/2017 Debridement Details Patient Name: [name] Date of Service: 05/13/2017 9:30 AM Medical Record Number: [id number] Patient Account Number: [id number] Date of Birth/Sex: 08/06/1940 (77 y.o. M) Treating [name]: [name] Primary Care Provider: Cyndi [name] Other Clinician: Referring Provider: Cyndi [name] Treating Provider/Extender: [name] in Treatment: 5 Debridement Performed for Wound #6 Right,Lateral Malleolus Assessment: Performed By: Physician Jermaine Dillon, MD Debridement Type: Debridement Severity of Tissue Pre Fat layer exposed Debridement: Pre-procedure Verification/Time Yes - 10:08 Out Taken: Start Time: 10:08 Pain Control: Other : lidocaine 4% Total Area Debrided (L x W): 0.7 (cm) x 0.7 (cm) = 0.49 (cm) Tissue and other material Viable, Non-Viable, Subcutaneous debrided: Level: Skin/Subcutaneous Tissue Debridement Description: Excisional Instrument: Curette Bleeding: Minimum Hemostasis Achieved: Pressure End Time: 10:12 Procedural Pain: 0 Post Procedural Pain: 0 Response to Treatment: Procedure was tolerated well Level of Consciousness: Awake and Alert Post Procedure Vitals: Temperature: 98.7 Pulse: 66 Respiratory Rate: 16 Blood Pressure: Systolic Blood Pressure: 90 Diastolic Blood Pressure: 58 Post Debridement Measurements of Total Wound Length: (cm) 0.7 Width: (cm) 0.7 Depth: (cm) 0.3 Volume: (cm) 0.115 Character of Wound/Ulcer Post Debridement: Requires Further Debridement Severity of Tissue Post Debridement: Fat layer exposed Post Procedure Diagnosis Same as Pre-procedure [name] ([id number]) Electronic Signature(s) Signed: 05/13/2017 5:47:31 PM By: [name], BSN, [name], CWS, [name], BSN Signed: 05/14/2017 1:16:01 PM By: Jermaine Ham MD Entered By: [name] on 05/13/2017 13:19:40 [name]  ([id number]) -------------------------------------------------------------------------------- Debridement Details Patient Name: [name] Date of Service: 05/13/2017 9:30 AM Medical Record Number: [id number] Patient Account Number: [id number] Date of Birth/Sex: November 22, 1940 (77 y.o. M) Treating [name]: [name] Primary Care Provider: Cyndi [name] Other Clinician: Referring Provider: Cyndi [name] Treating Provider/Extender: [name] in Treatment: 5 Debridement Performed for Wound #8 Right,Lateral [name] Assessment: Performed By: Physician Jermaine Dillon, MD Debridement Type: Debridement Severity of Tissue Pre Muscle involvement without necrosis Debridement: Pre-procedure Verification/Time Yes - 10:08 Out Taken: Start Time: 10:08 Pain Control: Other : lidocaine 4% Total Area Debrided (L x W): 0.6 (cm) x 0.8 (cm) = 0.48 (cm) Tissue and other material Viable, Non-Viable, Muscle, Subcutaneous debrided: Level: Skin/Subcutaneous Tissue/Muscle Debridement Description: Excisional Instrument: Curette Bleeding: Minimum Hemostasis Achieved: Pressure End Time: 10:12 Procedural Pain: 0 Post Procedural Pain: 0 Response to Treatment: Procedure was tolerated well Level of Consciousness: Awake and Alert Post Procedure Vitals: Temperature: 98.7 Pulse: 66 Respiratory Rate: 16 Blood Pressure: Systolic Blood Pressure: 90 Diastolic Blood Pressure: 58 Post Debridement Measurements of Total Wound Length: (cm) 0.6 Width: (cm) 0.8 Depth: (cm) 0.5 Volume: (cm) 0.188 Character of Wound/Ulcer Post Debridement: Requires Further Debridement Severity of Tissue Post Debridement: Muscle involvement without necrosis Post Procedure Diagnosis Same as Pre-procedure Electronic Signature(s) Signed: 05/13/2017 5:47:31 PM By: [name], BSN, [name], CWS, [name], BSN Signed: 05/14/2017 1:16:01 PM By: Jermaine Ham MD [name] ([id number]) Entered By: [name] on  05/13/2017 13:20:07 [name] ([id number]) -------------------------------------------------------------------------------- HPI Details Patient Name: [name] Date of Service: 05/13/2017 9:30 AM Medical Record Number: [id number] Patient Account Number: [id number] Date of Birth/Sex: 1940-03-10 (77 y.o. M) Treating [name]: [name] Primary Care Provider: Cyndi [name] Other Clinician: Referring Provider: Cyndi [name] Treating Provider/Extender: [name] in Treatment: 5 History of Present Illness HPI Description: 04/08/17; this is a complex 77 year old man referred here from [facility]. He had been referred there for bilateral lower extremity edema with ulcer formation predominantly on  the right calf but also the right [name]. He had been receiving Unna boots bilaterally. The history here is long. He is not a diabetic however ICU looking through late 2018 he was worked up for chronic headaches, elevated inflammatory markers including C-reactive protein and ESR. He went on to actually have a left temporal artery biopsy wasn't that was negative.he received about 6 weeks of high-dose prednisone 60 mg with improvement in his inflammatory markers. He was admitted to hospital in late November with ventricular tachycardia syncope. He has known ischemic cardiomyopathy. He was admitted in the hospital in mid December. Apparently this was precipitated by a syncopal spell falling out of his scooter while at [place]. There was ventricular arrhythmia. He has an implantable defibrillator and echocardiogram showed severe LV dysfunction with an EF of 20% and valvular regurgitations including mild AR, moderate MR. There was no stenosis. He ruled in for a non-ST elevation MI in the setting of V. tach.his wife states that sometime during this hospitalization she noted multiple areas of skin change on the right lower calf which became evident just after he left the  hospital. He was back in hospital in February with acute renal failure hyponatremia. This responded to fluid resuscitation.. Interestingly I can't see much description of his right leg at that point in time.he was followed by Dr. Nehemiah Massed of dermatology for the necrotic wounds on his right leg. Apparently a biopsy was planned at one point but not done although in some notes that suggests it was. I cannot see these results area He was noted to have a lot of edema. Was treated with bilateral Unna boots edges really helped with the swelling they have been using Bactroban to small open areas predominantly on the right anterior lower leg His history is complicated by the fact that he has rheumatoid arthritis followed by rheumatology. He is followed by neurology for disabling headaches. At one point this was felt to be giant cell arteritis although a left temporal biopsy was apparently negative. He was given a prolonged course of prednisone at 60 mg which managed his sedimentation rates but apparently did not prove improve the headaches. This is been tapered to off on by rheumatology on 03/13/17 Vascular had plans to do a venous reflux workup as well as arterial studies in May. They also wanted to get him a lymphedema pump. As mentioned he's been using bacitracin under Unna boot wraps to both lower legs 04/15/17; the patient arrives with most of his wounds improved. These are small punched out wounds. Most of them remaining ones are on the right anterior calf with the most problematic over the right lateral malleolus. There are no new areas. The symptom complex or potential symptom complex we are dealing with his chronic disabling headaches with inflammatory markers not responsive to prednisone and with a negative temporal biopsy, lower extremity weakness, skin ulcerations just on the right leg. We have managed to get his arterial studies moved up to April 30. He has a rheumatology consult at [facility] in June.  He has seen dermatology locally, rheumatology locally, neurology locally. 04/22/17; small punched out areas on the right leg anteriorly posteriorly. Most of these appear to have closed over. Some of them have eschar over the surface. The most problematic area appears to be over the right lateral malleolus. We've been using prisma to all of this. He has arterial studies on April 30 and a rheumatology consult at [facility] on June 28 04/29/17; most of the small punched out areas  on the right leg posteriorly are closed. He has 2 or 3 openings anteriorly but most of these appear to be on the way to closing. Still problematically over the right lateral malleolus and right lateral [name] with almost ischemic-looking eschar. His arterial studies that I ordered are due to be done next week on the 30th so we should have them available for our next visit hopefully. He also saw a rheumatologist at [facility] and according to the patient he did 8 vials of blood. Finally he has scaling rash on his left [name] and what looks to be at [facility] area on the left anterior leg 05/06/17; most of the small punched out areas on the right leg posteriorly and anteriorly are closed. He still has one small one anteriorly one over the right lateral malleolus and one over the right lateral [name]. [name] ([id number]) He had his arterial studies they didn't seem to do waveform analysis not exactly sure why however in any case is ABIs were noncompressible bilaterally. They did not provide TBIs although looking at the pressures it appears that his TBIs are quite normal. I therefore went ahead and debrided the area over the right lateral malleolus and the right lateral [name] 05/13/17; most of the small punched out areas on the right leg posteriorly and anteriorly are closed. He continues to have problematic areas over the right lateral malleolus and the right lateral [name]. He still requiring aggressive debridement of these 2 wounds using silver  collagen I reviewed the note from rheumatology I don't think they came up with a specific diagnosis although he is known to have seropositive RA. They did a panel of lab work when I was able to see his his AMA was negative, anti-smooth muscle antibodies negative antineutrophil cytoplasmic antibodies negative,Liv/kia type 1 negative. Serum C3 and C4 were negative. I don't see his muscle enzymes specifically. He was referred back to his local rheumatologist for management of his known rheumatoid arthritis.the patient states he still feels weak and fatigued. He states he has numbness in both feet and apparently is known to have neuropathy Electronic Signature(s) Signed: 05/14/2017 1:16:01 PM By: Jermaine Ham MD Entered By: [name] on 05/13/2017 13:22:52 [name] ([id number]) -------------------------------------------------------------------------------- Physical Exam Details Patient Name: [name] Date of Service: 05/13/2017 9:30 AM Medical Record Number: [id number] Patient Account Number: [id number] Date of Birth/Sex: 12-04-1940 (77 y.o. M) Treating [name]: [name] Primary Care Provider: Cyndi [name] Other Clinician: Referring Provider: Cyndi [name] Treating Provider/Extender: [name] in Treatment: 5 Constitutional persistently low blood pressures although he appears to be well and this is being consistent from week to week. Pulse regular and within target range for patient.[name] Respirations regular, non-labored and within target range.. Temperature is normal and within the target range for the patient.[name] appears in no distress. Notes wound exam; multiple areas on the right posterior and right anterior calves are completely healed or scabbed over. He continues to have problematic areas over the right lateral malleolus and the right lateral [name]. Both of these required debridement with a #3 curet to remove necrotic subcutaneous tissue and in the case  of the right lateral [name] necrotic muscle by the look of this. This wound is curiously close to bone on this side. There is no evidence of surrounding infection Electronic Signature(s) Signed: 05/14/2017 1:16:01 PM By: Jermaine Ham MD Entered By: [name] on 05/13/2017 13:24:35 [name] ([id number]) -------------------------------------------------------------------------------- Physician Orders Details Patient Name: [name] Date of  Service: 05/13/2017 9:30 AM Medical Record Number: [id number] Patient Account Number: [id number] Date of Birth/Sex: 27-Jun-1940 (77 y.o. M) Treating [name]: [name] Primary Care Provider: Cyndi [name] Other Clinician: Referring Provider: Cyndi [name] Treating Provider/Extender: [name] in Treatment: 5 Verbal / Phone Orders: No Diagnosis Coding Wound Cleansing Wound #4 Right,Proximal,Lateral Lower Leg o Clean wound with Normal Saline. Wound #6 Right,Lateral Malleolus o Clean wound with Normal Saline. Wound #8 Right,Lateral [name] o Clean wound with Normal Saline. Anesthetic (add to Medication List) Wound #4 Right,Proximal,Lateral Lower Leg o Topical Lidocaine 4% cream applied to wound bed prior to debridement (In Clinic Only). Wound #6 Right,Lateral Malleolus o Topical Lidocaine 4% cream applied to wound bed prior to debridement (In Clinic Only). Wound #8 Right,Lateral [name] o Topical Lidocaine 4% cream applied to wound bed prior to debridement (In Clinic Only). Skin Barriers/Peri-Wound Care Wound #4 Right,Proximal,Lateral Lower Leg o Triamcinolone Acetonide Ointment (TAC) Wound #6 Right,Lateral Malleolus o Triamcinolone Acetonide Ointment (TAC) Wound #8 Right,Lateral [name] o Triamcinolone Acetonide Ointment (TAC) Primary Wound Dressing Wound #4 Right,Proximal,Lateral Lower Leg o Silver Collagen - all wounds Wound #6 Right,Lateral Malleolus o Silver Collagen - all wounds Wound #8  Right,Lateral [name] o Silver Collagen - all wounds Secondary Dressing Wound #4 Right,Proximal,Lateral Lower Leg o ABD pad - over collegen, Unna paste to [place] at top [name]. ([id number]) Wound #6 Right,Lateral Malleolus o ABD pad - over collegen, Unna paste to [place] at top Wound #8 Right,Lateral [name] o ABD pad - over collegen, Unna paste to [place] at top Dressing Change Frequency Wound #4 Right,Proximal,Lateral Lower Leg o Change Dressing Monday, Wednesday, Friday Wound #6 Right,Lateral Malleolus o Change Dressing Monday, Wednesday, Friday Wound #8 Right,Lateral [name] o Change Dressing Monday, Wednesday, Friday Follow-up Appointments Wound #4 Right,Proximal,Lateral Lower Leg o Return Appointment in 1 week. Wound #6 Right,Lateral Malleolus o Return Appointment in 1 week. Wound #8 Right,Lateral [name] o Return Appointment in 1 week. Edema Control Wound #4 Right,Proximal,Lateral Lower Leg o 3 Layer Compression System - Right Lower Extremity Wound #6 Right,Lateral Malleolus o 3 Layer Compression System - Right Lower Extremity Wound #8 Right,Lateral [name] o 3 Layer Compression System - Right Lower Extremity Home Health Wound #4 Right,Proximal,Lateral Lower Leg o Continue Home Health Visits o Home Health Nurse may visit PRN to address patientos wound care needs. o FACE TO FACE ENCOUNTER: MEDICARE and MEDICAID PATIENTS: I certify that this patient is under my care and that I had a face-to-face encounter that meets the physician face-to-face encounter requirements with this patient on this date. The encounter with the patient was in whole or in part for the following MEDICAL CONDITION: (primary reason for [name]) MEDICAL NECESSITY: I certify, that based on my findings, NURSING services are a medically necessary home health service. HOME BOUND STATUS: I certify that my clinical findings support that this patient is homebound (i.e., Due to  illness or injury, pt requires aid of supportive devices such as crutches, cane, wheelchairs, walkers, the use of special transportation or the assistance of another person to leave their place of residence. There is a normal inability to leave the home and doing so requires considerable and taxing effort. Other absences are for medical reasons / religious services and are infrequent or of short duration when for other reasons). o If current dressing causes regression in wound condition, may D/C ordered dressing product/s and apply Normal Saline Moist Dressing daily until next [place] / Other MD appointment. [place]  of regression in wound condition at [phone]. o Please direct any NON-WOUND related issues/requests for orders to patient's Primary Care Physician [name] ([id number]) Wound #6 [place] Nurse may visit PRN to address patientos wound care needs. o FACE TO FACE ENCOUNTER: MEDICARE and MEDICAID PATIENTS: I certify that this patient is under my care and that I had a face-to-face encounter that meets the physician face-to-face encounter requirements with this patient on this date. The encounter with the patient was in whole or in part for the following MEDICAL CONDITION: (primary reason for [place]) MEDICAL NECESSITY: I certify, that based on my findings, NURSING services are a medically necessary home health service. HOME BOUND STATUS: I certify that my clinical findings support that this patient is homebound (i.e., Due to illness or injury, pt requires aid of supportive devices such as crutches, cane, wheelchairs, walkers, the use of special transportation or the assistance of another person to leave their place of residence. There is a normal inability to leave the home and doing so requires considerable and taxing effort. Other absences are for medical reasons /  religious services and are infrequent or of short duration when for other reasons). o If current dressing causes regression in wound condition, may D/C ordered dressing product/s and apply Normal Saline Moist Dressing daily until next [place] / Other MD appointment. [place] of regression in wound condition at [phone]. o Please direct any NON-WOUND related issues/requests for orders to patient's Primary Care Physician Wound #8 [place] Nurse may visit PRN to address patientos wound care needs. o FACE TO FACE ENCOUNTER: MEDICARE and MEDICAID PATIENTS: I certify that this patient is under my care and that I had a face-to-face encounter that meets the physician face-to-face encounter requirements with this patient on this date. The encounter with the patient was in whole or in part for the following MEDICAL CONDITION: (primary reason for [place]) MEDICAL NECESSITY: I certify, that based on my findings, NURSING services are a medically necessary home health service. HOME BOUND STATUS: I certify that my clinical findings support that this patient is homebound (i.e., Due to illness or injury, pt requires aid of supportive devices such as crutches, cane, wheelchairs, walkers, the use of special transportation or the assistance of another person to leave their place of residence. There is a normal inability to leave the home and doing so requires considerable and taxing effort. Other absences are for medical reasons / religious services and are infrequent or of short duration when for other reasons). o If current dressing causes regression in wound condition, may D/C ordered dressing product/s and apply Normal Saline Moist Dressing daily until next [place] / Other MD appointment. [place] of regression in wound condition at [phone]. o Please direct any  NON-WOUND related issues/requests for orders to patient's Primary Care Physician Medications-please add to medication list. Wound #4 Right,Proximal,Lateral Lower Leg o P.O. Antibiotics - Doxycycine Wound #6 Right,Lateral Malleolus o P.O. Antibiotics - Doxycycine Wound #8 Right,Lateral [name] o P.O. Antibiotics - Doxycycine Patient Medications Allergies: NKDA Notifications Medication Indication Start End doxycycline monohydrate 05/14/2017 DOSE oral 100 mg capsule - 1capsule oral bid for 2 weeks DELNO, BLAISDELL ([id number]) Electronic Signature(s) Signed: 05/13/2017 8:26:34 PM By: Jermaine Ham MD Previous Signature: 05/13/2017 5:47:31 PM Version By: [name], BSN, [name], CWS, [name], BSN Entered By: [name] on 05/13/2017  20:26:33 ANTJUAN, ROTHE ([id number]) -------------------------------------------------------------------------------- Problem List Details Patient Name: HENLEY, BOETTNER Date of Service: 05/13/2017 9:30 AM Medical Record Number: [id number] Patient Account Number: [id number] Date of Birth/Sex: Sep 30, 1940 (77 y.o. 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 breakdown 04/08/2017 Yes of skin L97.511 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 limited to 04/08/2017 Yes breakdown of skin I25.5 Ischemic cardiomyopathy 04/08/2017 Yes Inactive Problems Resolved Problems Electronic Signature(s) Signed: 05/14/2017 1:16:01 PM By: Jermaine Ham MD Entered By: [name] on 05/13/2017 13:19:03 Junod, [name]  ([id number]) -------------------------------------------------------------------------------- Progress Note Details Patient Name: [name] Date of Service: 05/13/2017 9:30 AM Medical Record Number: [id number] Patient Account Number: [id number] Date of Birth/Sex: [birth year] (77 y.o. M) Treating [name]: [name] Primary Care Provider: Cyndi [name] Other Clinician: Referring Provider: Cyndi [name] Treating Provider/Extender: [name] in Treatment: 5 Subjective History of Present Illness (HPI) 04/08/17; this is a complex 77 year old man referred here from [facility]. He had been referred there for bilateral lower extremity edema with ulcer formation predominantly on the right calf but also the right [name]. He had been receiving Unna boots bilaterally. The history here is long. He is not a diabetic however ICU looking through late 2018 he was worked up for chronic headaches, elevated inflammatory markers including C-reactive protein and ESR. He went on to actually have a left temporal artery biopsy wasn't that was negative.he received about 6 weeks of high-dose prednisone 60 mg with improvement in his inflammatory markers. He was admitted to hospital in late November with ventricular tachycardia syncope. He has known ischemic cardiomyopathy. He was admitted in the hospital in mid December. Apparently this was precipitated by a syncopal spell falling out of his scooter while at [place]. There was ventricular arrhythmia. He has an implantable defibrillator and echocardiogram showed severe LV dysfunction with an EF of 20% and valvular regurgitations including mild AR, moderate MR. There was no stenosis. He ruled in for a non-ST elevation MI in the setting of V. tach.his wife states that sometime during this hospitalization she noted multiple areas of skin change on the right lower calf which became evident just after he left the hospital. He was back in hospital  in February with acute renal failure hyponatremia. This responded to fluid resuscitation.. Interestingly I can't see much description of his right leg at that point in time.he was followed by Dr. Nehemiah Massed of dermatology for the necrotic wounds on his right leg. Apparently a biopsy was planned at one point but not done although in some notes that suggests it was. I cannot see these results area He was noted to have a lot of edema. Was treated with bilateral Unna boots edges really helped with the swelling they have been using Bactroban to small open areas predominantly on the right anterior lower leg His history is complicated by the fact that he has rheumatoid arthritis followed by rheumatology. He is followed by neurology for disabling headaches. At one point this was felt to be giant cell arteritis although a left temporal biopsy was apparently negative. He was given a prolonged course of prednisone at 60 mg which managed his sedimentation rates but  apparently did not prove improve the headaches. This is been tapered to off on by rheumatology on 03/13/17 Vascular had plans to do a venous reflux workup as well as arterial studies in May. They also wanted to get him a lymphedema pump. As mentioned he's been using bacitracin under Unna boot wraps to both lower legs 04/15/17; the patient arrives with most of his wounds improved. These are small punched out wounds. Most of them remaining ones are on the right anterior calf with the most problematic over the right lateral malleolus. There are no new areas. The symptom complex or potential symptom complex we are dealing with his chronic disabling headaches with inflammatory markers not responsive to prednisone and with a negative temporal biopsy, lower extremity weakness, skin ulcerations just on the right leg. We have managed to get his arterial studies moved up to April 30. He has a rheumatology consult at [facility] in June. He has seen dermatology locally,  rheumatology locally, neurology locally. 04/22/17; small punched out areas on the right leg anteriorly posteriorly. Most of these appear to have closed over. Some of them have eschar over the surface. The most problematic area appears to be over the right lateral malleolus. We've been using prisma to all of this. He has arterial studies on April 30 and a rheumatology consult at [facility] on June 28 04/29/17; most of the small punched out areas on the right leg posteriorly are closed. He has 2 or 3 openings anteriorly but most of these appear to be on the way to closing. Still problematically over the right lateral malleolus and right lateral [name] with almost ischemic-looking eschar. His arterial studies that I ordered are due to be done next week on the 30th so we should have them available for our next visit hopefully. He also saw a rheumatologist at [facility] and according to the patient he did 8 vials of blood. Finally he has scaling rash on his left [name] and what looks to be at [facility] area on the left anterior leg 05/06/17; most of the small punched out areas on the right leg posteriorly and anteriorly are closed. He still has one small one Silas, Pierson E. ([id number]) anteriorly one over the right lateral malleolus and one over the right lateral [name]. He had his arterial studies they didn't seem to do waveform analysis not exactly sure why however in any case is ABIs were noncompressible bilaterally. They did not provide TBIs although looking at the pressures it appears that his TBIs are quite normal. I therefore went ahead and debrided the area over the right lateral malleolus and the right lateral [name] 05/13/17; most of the small punched out areas on the right leg posteriorly and anteriorly are closed. He continues to have problematic areas over the right lateral malleolus and the right lateral [name]. He still requiring aggressive debridement of these 2 wounds using silver collagen I reviewed the note  from rheumatology I don't think they came up with a specific diagnosis although he is known to have seropositive RA. They did a panel of lab work when I was able to see his his AMA was negative, anti-smooth muscle antibodies negative antineutrophil cytoplasmic antibodies negative,Liv/kia type 1 negative. Serum C3 and C4 were negative. I don't see his muscle enzymes specifically. He was referred back to his local rheumatologist for management of his known rheumatoid arthritis.the patient states he still feels weak and fatigued. He states he has numbness in both feet and apparently is known to have neuropathy Objective  Constitutional persistently low blood pressures although he appears to be well and this is being consistent from week to week. Pulse regular and within target range for patient.[name] Respirations regular, non-labored and within target range.. Temperature is normal and within the target range for the patient.[name] appears in no distress. Vitals Time Taken: 9:35 AM, Height: 68 in, Temperature: 97.6 F, Pulse: 70 bpm, Respiratory Rate: 16 breaths/min, Blood Pressure: 84/48 mmHg. General Notes: took BP manually and it was 90/58 made MD Dellia Nims aware of same. General Notes: wound exam; multiple areas on the right posterior and right anterior calves are completely healed or scabbed over. He continues to have problematic areas over the right lateral malleolus and the right lateral [name]. Both of these required debridement with a #3 curet to remove necrotic subcutaneous tissue and in the case of the right lateral [name] necrotic muscle by the look of this. This wound is curiously close to bone on this side. There is no evidence of surrounding infection Integumentary (Hair, Skin) Wound #4 status is Open. Original cause of wound was Gradually Appeared. The wound is located on the Right,Proximal,Lateral Lower Leg. The wound measures 0.4cm length x 0.2cm width x 0.1cm depth; 0.063cm^2 area and 0.006cm^3  volume. There is no tunneling or undermining noted. There is a small amount of serosanguineous drainage noted. The wound margin is flat and intact. There is no granulation within the wound bed. There is a large (67-100%) amount of necrotic tissue within the wound bed including Eschar and Adherent Slough. Periwound temperature was noted as No Abnormality. The periwound has tenderness on palpation. Wound #6 status is Open. Original cause of wound was Gradually Appeared. The wound is located on the Right,Lateral Malleolus. The wound measures 0.7cm length x 0.7cm width x 0.2cm depth; 0.385cm^2 area and 0.077cm^3 volume. There is no tunneling or undermining noted. There is a medium amount of serosanguineous drainage noted. The wound margin is distinct with the outline attached to the wound base. There is no granulation within the wound bed. There is a large (67-100%) amount of necrotic tissue within the wound bed including Eschar and Adherent Slough. Periwound temperature was noted as No Abnormality. The periwound has tenderness on palpation. Wound #8 status is Open. Original cause of wound was Gradually Appeared. The wound is located on the Right,Lateral [name]. The wound measures 0.6cm length x 0.8cm width x 0.1cm depth; 0.377cm^2 area and 0.038cm^3 volume. There is no tunneling or undermining noted. There is a medium amount of serosanguineous drainage noted. The wound margin is flat and intact. There is no granulation within the wound bed. There is a large (67-100%) amount of necrotic tissue within the wound [name], Herberth E. ([id number]) bed including Eschar. The periwound skin appearance exhibited: Erythema. The periwound skin appearance did not exhibit: Callus, Crepitus, Excoriation, Induration, Rash, Scarring, Dry/Scaly, Maceration, Atrophie Blanche, Cyanosis, Ecchymosis, Hemosiderin Staining, Mottled, Pallor, Rubor. The surrounding wound skin color is noted with erythema which is circumferential.  Periwound temperature was noted as No Abnormality. 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 There was a Excisional Skin/Subcutaneous Tissue Debridement  with a total area of 0.49 sq cm performed by Jermaine Dillon, MD. With the following instrument(s): Curette. to remove Viable and Non-Viable tissue/material Material removed includes Subcutaneous Tissue after achieving pain control using Other (lidocaine 4%). No specimens were taken. A time out was conducted at 10:08, prior to the start of the procedure. A Minimum amount of bleeding was controlled with Pressure. The procedure was tolerated well with a pain level of 0 throughout and a pain level of 0 following the procedure. Patient s Level of Consciousness post procedure was recorded as Awake and Alert and post-procedure vitals were taken including Temperature: 98.7 F, Pulse: 66 bpm, Respiratory Rate: 16 breaths/min, Blood Pressure: (90)/(58) mmHg. Post Debridement Measurements: 0.7cm length x 0.7cm width x 0.3cm depth; 0.115cm^3 volume. Character of Wound/Ulcer Post Debridement requires further debridement. Severity of Tissue Post Debridement is: Fat layer exposed. Post procedure Diagnosis Wound #6: Same as Pre-Procedure Wound #8 Pre-procedure diagnosis of Wound #8 is an Arterial Insufficiency Ulcer located on the Right,Lateral [name] .Severity of Tissue Pre Debridement is: Muscle involvement without necrosis. There was a Excisional Skin/Subcutaneous  Tissue/Muscle Debridement with a total area of 0.48 sq cm performed by Jermaine Dillon, MD. With the following instrument(s): Curette. to remove Viable and Non-Viable tissue/material Material removed includes Muscle and Subcutaneous Tissue and after achieving pain control using Other (lidocaine 4%). No specimens were taken. A time out was conducted at 10:08, prior to the start of the procedure. A Minimum amount of bleeding was controlled with Pressure. The procedure was tolerated well with a pain level of 0 throughout and a pain level of 0 following the procedure. Patient s Level of Consciousness post procedure was recorded as Awake and Alert and post-procedure vitals were taken including Temperature: 98.7 F, Pulse: 66 bpm, Respiratory Rate: 16 breaths/min, Blood Pressure: (90)/(58) mmHg. Post Debridement Measurements: 0.6cm length x 0.8cm width x 0.5cm depth; 0.188cm^3 volume. Character of Wound/Ulcer Post Debridement requires further debridement. Severity of Tissue Post Debridement is: Muscle involvement without necrosis. Post procedure Diagnosis Wound #8: Same as Pre-Procedure [name], Jermaine E. ([id number]) Plan Wound Cleansing: Wound #4 Right,Proximal,Lateral Lower Leg: Clean wound with Normal Saline. Wound #6 Right,Lateral Malleolus: Clean wound with Normal Saline. Wound #8 Right,Lateral [name]: Clean wound with Normal Saline. Anesthetic (add to Medication List): Wound #4 Right,Proximal,Lateral Lower Leg: Topical Lidocaine 4% cream applied to wound bed prior to debridement (In Clinic Only). Wound #6 Right,Lateral Malleolus: Topical Lidocaine 4% cream applied to wound bed prior to debridement (In Clinic Only). Wound #8 Right,Lateral [name]: Topical Lidocaine 4% cream applied to wound bed prior to debridement (In Clinic Only). Skin Barriers/Peri-Wound Care: Wound #4 Right,Proximal,Lateral Lower Leg: Triamcinolone Acetonide Ointment (TAC) Wound #6 Right,Lateral  Malleolus: Triamcinolone Acetonide Ointment (TAC) Wound #8 Right,Lateral [name]: Triamcinolone Acetonide Ointment (TAC) Primary Wound Dressing: Wound #4 Right,Proximal,Lateral Lower Leg: Silver Collagen - all wounds Wound #6 Right,Lateral Malleolus: Silver Collagen - all wounds Wound #8 Right,Lateral [name]: Silver Collagen - all wounds Secondary Dressing: Wound #4 Right,Proximal,Lateral Lower Leg: ABD pad - over collegen, Unna paste to Anchor at top Wound #6 Right,Lateral Malleolus: ABD pad - over collegen, Unna paste to Anchor at top Wound #8 Right,Lateral [name]: ABD pad - over collegen, Unna paste to Anchor at top Dressing Change Frequency: Wound #4 Right,Proximal,Lateral Lower Leg: Change Dressing Monday, Wednesday, Friday Wound #6 Right,Lateral Malleolus: Change Dressing Monday, Wednesday, Friday Wound #8 Right,Lateral [name]: Change Dressing Monday, Wednesday, Friday Follow-up Appointments: Wound #4 Right,Proximal,Lateral Lower Leg: Return Appointment in 1 week. Wound #  6 Right,Lateral Malleolus: Return Appointment in 1 week. Wound #8 Right,Lateral [name]: Return Appointment in 1 week. Edema Control: Wound #4 Right,Proximal,Lateral Lower Leg: 3 Layer Compression System - Right Lower Extremity Wound #6 Right,Lateral Malleolus: 3 Layer Compression System - Right Lower Extremity Pangelinan, Stirling E. ([id number]) Wound #8 Right,Lateral [name]: 3 Layer Compression System - Right Lower Extremity Home Health: Wound #4 Right,Proximal,Lateral Lower Leg: [place] Nurse may visit PRN to address patient s wound care needs. FACE TO FACE ENCOUNTER: MEDICARE and MEDICAID PATIENTS: I certify that this patient is under my care and that I had a face-to-face encounter that meets the physician face-to-face encounter requirements with this patient on this date. The encounter with the patient was in whole or in part for the following MEDICAL CONDITION: (primary reason  for [place]) MEDICAL NECESSITY: I certify, that based on my findings, NURSING services are a medically necessary home health service. HOME BOUND STATUS: I certify that my clinical findings support that this patient is homebound (i.e., Due to illness or injury, pt requires aid of supportive devices such as crutches, cane, wheelchairs, walkers, the use of special transportation or the assistance of another person to leave their place of residence. There is a normal inability to leave the home and doing so requires considerable and taxing effort. Other absences are for medical reasons / religious services and are infrequent or of short duration when for other reasons). If current dressing causes regression in wound condition, may D/C ordered dressing product/s and apply Normal Saline Moist Dressing daily until next [place] / Other MD appointment. [place] of regression in wound condition at [phone]. Please direct any NON-WOUND related issues/requests for orders to patient's Primary Care Physician Wound #6 Right,Lateral Malleolus: [place] Nurse may visit PRN to address patient s wound care needs. FACE TO FACE ENCOUNTER: MEDICARE and MEDICAID PATIENTS: I certify that this patient is under my care and that I had a face-to-face encounter that meets the physician face-to-face encounter requirements with this patient on this date. The encounter with the patient was in whole or in part for the following MEDICAL CONDITION: (primary reason for [place]) MEDICAL NECESSITY: I certify, that based on my findings, NURSING services are a medically necessary home health service. HOME BOUND STATUS: I certify that my clinical findings support that this patient is homebound (i.e., Due to illness or injury, pt requires aid of supportive devices such as crutches, cane, wheelchairs, walkers, the use of special transportation or the  assistance of another person to leave their place of residence. There is a normal inability to leave the home and doing so requires considerable and taxing effort. Other absences are for medical reasons / religious services and are infrequent or of short duration when for other reasons). If current dressing causes regression in wound condition, may D/C ordered dressing product/s and apply Normal Saline Moist Dressing daily until next [place] / Other MD appointment. [place] of regression in wound condition at [phone]. Please direct any NON-WOUND related issues/requests for orders to patient's Primary Care Physician Wound #8 Right,Lateral [name]: [place] Nurse may visit PRN to address patient s wound care needs. FACE TO FACE ENCOUNTER: MEDICARE and MEDICAID PATIENTS: I certify that this patient is under my care and that I had a face-to-face encounter that meets the physician face-to-face encounter requirements with this patient on this date. The encounter with  the patient was in whole or in part for the following MEDICAL CONDITION: (primary reason for [place]) MEDICAL NECESSITY: I certify, that based on my findings, NURSING services are a medically necessary home health service. HOME BOUND STATUS: I certify that my clinical findings support that this patient is homebound (i.e., Due to illness or injury, pt requires aid of supportive devices such as crutches, cane, wheelchairs, walkers, the use of special transportation or the assistance of another person to leave their place of residence. There is a normal inability to leave the home and doing so requires considerable and taxing effort. Other absences are for medical reasons / religious services and are infrequent or of short duration when for other reasons). If current dressing causes regression in wound condition, may D/C ordered dressing product/s and apply Normal  Saline Moist Dressing daily until next [place] / Other MD appointment. [place] of regression in wound condition at [phone]. Please direct any NON-WOUND related issues/requests for orders to patient's Primary Care Physician Medications-please add to medication list.: Wound #4 Right,Proximal,Lateral Lower Leg: P.O. Antibiotics - Doxycycine Wound #6 Right,Lateral Malleolus: P.O. Antibiotics - Doxycycine Wound #8 Right,Lateral [name]: P.O. Antibiotics - Doxycycine TYJON, BOWEN ([id number]) The following medication(s) was prescribed: doxycycline monohydrate oral 100 mg capsule 1capsule oral bid for 2 weeks starting 05/14/2017 #1 I'm going to continue with the Silver collagen to the 2 major wound areas #2 the pathogenesis of these wounds is unclear to me. I don't think his vascular study showed significant macrovascular disease. He does not appear to have a obvious microvascular cause/vasculopathy. The rheumatologist to do did not come up with a specific alternative diagnosis was the patient's interpretation of what he spent all. Indeed I didn't see anything specifically wrong with the lab work that I was able to see. I didn't see his repeat inflammatory markers although he does have RA as a possible contributor to this #3 emperic doxy 100 bid for 2 weeks as an antiinflammatory. If this shows some postitive effect then 50 bidoo Electronic Signature(s) Signed: 05/13/2017 8:28:43 PM By: Jermaine Ham MD Entered By: [name] on 05/13/2017 20:28:43 [name] ([id number]) -------------------------------------------------------------------------------- [name] Details Patient Name: [name] Date of Service: 05/13/2017 Medical Record Number: [id number] Patient Account Number: [id number] Date of Birth/Sex: Mar 07, 1940 (77 y.o. 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 limited to breakdown of skin I25.5 Ischemic cardiomyopathy Facility Procedures CPT4 Code Description: [id number] 11042 - DEB SUBQ TISSUE 20 SQ CM/< ICD-10 Diagnosis Description L97.211 Non-pressure chronic ulcer of right calf limited to breakdown o Modifier: 77 f skin Quantity: 1 CPT4 Code Description: [id number] 11043 - DEB MUSC/FASCIA 20 SQ CM/< ICD-10 Diagnosis Description L97.511 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 limited Modifier: to breakdown of Quantity: 1 skin Physician Procedures CPT4 Code Description: [id number] 47096 - WC PHYS SUBQ TISS 20 SQ CM ICD-10 Diagnosis Description L97.211 Non-pressure chronic ulcer of right calf limited to breakdown of Modifier: 59 skin Quantity: 1 CPT4 Code Description: [id number] 11043 - WC PHYS DEBR MUSCLE/FASCIA 20 SQ CM ICD-10 Diagnosis Description L97.511 Non-pressure chronic ulcer of other part of  right [name] limited to Modifier: breakdown of Quantity: 1 skin Electronic Signature(s) Signed: 05/14/2017 1:16:01 PM By: Jermaine Ham MD Entered By: [name] on 05/13/2017 13:26:54

## 2017-05-18 DIAGNOSIS — I5023 Acute on chronic systolic (congestive) heart failure: Secondary | ICD-10-CM | POA: Diagnosis not present

## 2017-05-18 DIAGNOSIS — I255 Ischemic cardiomyopathy: Secondary | ICD-10-CM | POA: Diagnosis not present

## 2017-05-18 DIAGNOSIS — S81811D Laceration without foreign body, right lower leg, subsequent encounter: Secondary | ICD-10-CM | POA: Diagnosis not present

## 2017-05-18 DIAGNOSIS — I2510 Atherosclerotic heart disease of native coronary artery without angina pectoris: Secondary | ICD-10-CM | POA: Diagnosis not present

## 2017-05-18 DIAGNOSIS — I110 Hypertensive heart disease with heart failure: Secondary | ICD-10-CM | POA: Diagnosis not present

## 2017-05-18 DIAGNOSIS — M069 Rheumatoid arthritis, unspecified: Secondary | ICD-10-CM | POA: Diagnosis not present

## 2017-05-20 ENCOUNTER — Encounter: Payer: Medicare Other | Admitting: Internal Medicine

## 2017-05-20 DIAGNOSIS — L97519 Non-pressure chronic ulcer of other part of right foot with unspecified severity: Secondary | ICD-10-CM | POA: Diagnosis not present

## 2017-05-20 DIAGNOSIS — I70233 Atherosclerosis of native arteries of right leg with ulceration of ankle: Secondary | ICD-10-CM | POA: Diagnosis not present

## 2017-05-20 DIAGNOSIS — L97512 Non-pressure chronic ulcer of other part of right foot with fat layer exposed: Secondary | ICD-10-CM | POA: Diagnosis not present

## 2017-05-20 DIAGNOSIS — I255 Ischemic cardiomyopathy: Secondary | ICD-10-CM | POA: Diagnosis not present

## 2017-05-20 DIAGNOSIS — L97211 Non-pressure chronic ulcer of right calf limited to breakdown of skin: Secondary | ICD-10-CM | POA: Diagnosis not present

## 2017-05-20 DIAGNOSIS — L97521 Non-pressure chronic ulcer of other part of left foot limited to breakdown of skin: Secondary | ICD-10-CM | POA: Diagnosis not present

## 2017-05-20 DIAGNOSIS — I472 Ventricular tachycardia: Secondary | ICD-10-CM | POA: Diagnosis not present

## 2017-05-20 DIAGNOSIS — L97312 Non-pressure chronic ulcer of right ankle with fat layer exposed: Secondary | ICD-10-CM | POA: Diagnosis not present

## 2017-05-20 DIAGNOSIS — S91302A Unspecified open wound, left foot, initial encounter: Secondary | ICD-10-CM | POA: Diagnosis not present

## 2017-05-20 DIAGNOSIS — I70235 Atherosclerosis of native arteries of right leg with ulceration of other part of foot: Secondary | ICD-10-CM | POA: Diagnosis not present

## 2017-05-20 DIAGNOSIS — L97515 Non-pressure chronic ulcer of other part of right foot with muscle involvement without evidence of necrosis: Secondary | ICD-10-CM | POA: Diagnosis not present

## 2017-05-20 DIAGNOSIS — L97219 Non-pressure chronic ulcer of right calf with unspecified severity: Secondary | ICD-10-CM | POA: Diagnosis not present

## 2017-05-21 ENCOUNTER — Ambulatory Visit
Admission: RE | Admit: 2017-05-21 | Discharge: 2017-05-21 | Disposition: A | Discharge status: Home / Self Care | Payer: Medicare Other | Source: Ambulatory Visit | Attending: Neurology | Admitting: Neurology

## 2017-05-21 DIAGNOSIS — M47816 Spondylosis without myelopathy or radiculopathy, lumbar region: Secondary | ICD-10-CM | POA: Insufficient documentation

## 2017-05-21 DIAGNOSIS — M545 Low back pain, unspecified: Secondary | ICD-10-CM

## 2017-05-21 DIAGNOSIS — S32000A Wedge compression fracture of unspecified lumbar vertebra, initial encounter for closed fracture: Secondary | ICD-10-CM | POA: Diagnosis not present

## 2017-05-21 DIAGNOSIS — M4856XA Collapsed vertebra, not elsewhere classified, lumbar region, initial encounter for fracture: Secondary | ICD-10-CM | POA: Diagnosis not present

## 2017-05-21 DIAGNOSIS — G8929 Other chronic pain: Secondary | ICD-10-CM | POA: Insufficient documentation

## 2017-05-21 DIAGNOSIS — J90 Pleural effusion, not elsewhere classified: Secondary | ICD-10-CM | POA: Diagnosis not present

## 2017-05-21 DIAGNOSIS — I700 Atherosclerosis of aorta: Secondary | ICD-10-CM | POA: Diagnosis not present

## 2017-05-22 ENCOUNTER — Ambulatory Visit (INDEPENDENT_AMBULATORY_CARE_PROVIDER_SITE_OTHER): Payer: Medicare Other | Admitting: Vascular Surgery

## 2017-05-22 ENCOUNTER — Encounter (INDEPENDENT_AMBULATORY_CARE_PROVIDER_SITE_OTHER): Payer: Medicare Other

## 2017-05-22 DIAGNOSIS — I255 Ischemic cardiomyopathy: Secondary | ICD-10-CM | POA: Diagnosis not present

## 2017-05-22 DIAGNOSIS — S81811D Laceration without foreign body, right lower leg, subsequent encounter: Secondary | ICD-10-CM | POA: Diagnosis not present

## 2017-05-22 DIAGNOSIS — I5023 Acute on chronic systolic (congestive) heart failure: Secondary | ICD-10-CM | POA: Diagnosis not present

## 2017-05-22 DIAGNOSIS — I2510 Atherosclerotic heart disease of native coronary artery without angina pectoris: Secondary | ICD-10-CM | POA: Diagnosis not present

## 2017-05-22 DIAGNOSIS — M069 Rheumatoid arthritis, unspecified: Secondary | ICD-10-CM | POA: Diagnosis not present

## 2017-05-22 DIAGNOSIS — I110 Hypertensive heart disease with heart failure: Secondary | ICD-10-CM | POA: Diagnosis not present

## 2017-05-22 NOTE — Progress Notes (Signed)
ABHISHEK, LEVESQUE ([id number]) Visit Report for 05/20/2017 Arrival Information Details Patient Name: BODHI, MORADI Date of Service: 05/20/2017 9:15 AM Medical Record Number: [id number] Patient Account Number: [id number] Date of Birth/Sex: 01-31-1940 (76 y.o. M) Treating RN: Roger Shelter Primary Care Sybrina Laning: Cyndi Bender Other Clinician: Referring Teren Franckowiak: Cyndi Bender Treating Hartman Minahan/Extender: Tito Dine in Treatment: 6 Visit Information History Since Last Visit All ordered tests and consults were completed: No Patient Arrived: Cane Added or deleted any medications: No Arrival Time: 09:38 Any new allergies or adverse reactions: No Accompanied By: wife Had a fall or experienced change in No Transfer Assistance: None activities of daily living that may affect Patient Identification Verified: Yes risk of falls: Secondary Verification Process Completed: Yes Signs or symptoms of abuse/neglect since last visito No Patient Requires Transmission-Based No Hospitalized since last visit: No Precautions: Implantable device outside of the clinic excluding No Patient Has Alerts: Yes cellular tissue based products placed in the center Patient Alerts: R ABI since last visit: >220 Pain Present Now: No Electronic Signature(s) Signed: 05/20/2017 3:23:03 PM By: Roger Shelter Entered By: Roger Shelter on 05/20/2017 09:38:56 Norberto, [name] ([id number]) -------------------------------------------------------------------------------- Encounter Discharge Information Details Patient Name: [name] Date of Service: 05/20/2017 9:15 AM Medical Record Number: [id number] Patient Account Number: [id number] Date of Birth/Sex: [birth year] (76 y.o. M) Treating RN: Montey Hora Primary Care Nathin Saran: Cyndi Bender Other Clinician: Referring Hiroshi Krummel: Cyndi Bender Treating Angelamarie Avakian/Extender: Tito Dine in Treatment: 6 Encounter Discharge  Information Items Discharge Condition: Stable Ambulatory Status: Cane Discharge Destination: Home Transportation: Private Auto Accompanied By: spouse Schedule Follow-up Appointment: Yes Clinical Summary of Care: Electronic Signature(s) Signed: 05/20/2017 11:51:36 AM By: Montey Hora Entered By: Montey Hora on 05/20/2017 11:51:36 Tetterton, [name] ([id number]) -------------------------------------------------------------------------------- Lower Extremity Assessment Details Patient Name: [name] Date of Service: 05/20/2017 9:15 AM Medical Record Number: [id number] Patient Account Number: [id number] Date of Birth/Sex: 03-14-1940 (76 y.o. M) Treating RN: Roger Shelter Primary Care Zully Frane: Cyndi Bender Other Clinician: Referring Charnice Zwilling: Cyndi Bender Treating Dawn Convery/Extender: Tito Dine in Treatment: 6 Edema Assessment Assessed: [Left: No] [Right: No] Edema: [Left: N] [Right: o] Calf Left: Right: Point of Measurement: 27 cm From Medial Instep 29 cm cm Ankle Left: Right: Point of Measurement: 11 cm From Medial Instep 21.2 cm cm Vascular Assessment Claudication: Claudication Assessment [Left:None] Pulses: Dorsalis Pedis Palpable: [Left:Yes] Posterior Tibial Extremity colors, hair growth, and conditions: Extremity Color: [Left:Hyperpigmented] Hair Growth on Extremity: [Left:Yes] Temperature of Extremity: [Left:Warm] Capillary Refill: [Left:< 3 seconds] Toe Nail Assessment Left: Right: Thick: Yes Discolored: Yes Deformed: No Improper Length and Hygiene: No Electronic Signature(s) Signed: 05/20/2017 3:23:03 PM By: Roger Shelter Entered By: Roger Shelter on 05/20/2017 09:51:43 Macken, [name] ([id number]) -------------------------------------------------------------------------------- Multi Wound Chart Details Patient Name: [name] Date of Service: 05/20/2017 9:15 AM Medical Record Number: [id number] Patient  Account Number: [id number] Date of Birth/Sex: [birth year] (76 y.o. M) Treating RN: Cornell Barman Primary Care Chavela Justiniano: Cyndi Bender Other Clinician: Referring Contrina Orona: Cyndi Bender Treating Talisa Petrak/Extender: Tito Dine in Treatment: 6 Vital Signs Height(in): 68 Pulse(bpm): 76 Weight(lbs): Blood Pressure(mmHg): 99/42 Body Mass Index(BMI): Temperature(F): 97.6 Respiratory Rate 16 (breaths/min): Photos: Wound Location: Right Lower Leg - Lateral, Right Malleolus - Lateral Right Foot - Lateral Proximal Wounding Event: Gradually Appeared Gradually Appeared Gradually Appeared Primary Etiology: Arterial Insufficiency Ulcer Arterial Insufficiency Ulcer Arterial Insufficiency Ulcer Comorbid History: Congestive Heart Failure, Congestive Heart Failure, Congestive Heart Failure, Coronary Artery Disease, Coronary Artery Disease, Coronary Artery Disease, Hypertension,  Myocardial Hypertension, Myocardial Hypertension, Myocardial Infarction, Osteoarthritis Infarction, Osteoarthritis Infarction, Osteoarthritis Date Acquired: 04/08/2017 03/23/2017 04/29/2017 Weeks of Treatment: 6 6 3  Wound Status: Open Open Open Measurements L x W x D 0.1x0.1x0.1 0.6x0.6x0.2 0.6x0.8x0.2 (cm) Area (cm) : 0.008 0.283 0.377 Volume (cm) : 0.001 0.057 0.075 % Reduction in Area: 95.90% -812.90% -431.00% % Reduction in Volume: 98.30% -1800.00% -971.40% Classification: Full Thickness Without Full Thickness Without Partial Thickness Exposed Support Structures Exposed Support Structures Exudate Amount: None Present Medium Medium Exudate Type: N/A Serosanguineous Serosanguineous Exudate Color: N/A red, brown red, brown Wound Margin: Flat and Intact Distinct, outline attached Flat and Intact Granulation Amount: None Present (0%) None Present (0%) None Present (0%) Granulation Quality: N/A N/A N/A Necrotic Amount: Large (67-100%) Large (67-100%) Large (67-100%) Necrotic Tissue: Eschar Eschar, Adherent  Slough Eschar, Adherent Slough Exposed Structures: Fascia: No Fascia: No Fascia: No Fat Layer (Subcutaneous Fat Layer (Subcutaneous Fat Layer (Subcutaneous Salomon, Garner E. ([id number]) Tissue) Exposed: No Tissue) Exposed: No Tissue) Exposed: No Tendon: No Tendon: No Tendon: No Muscle: No Muscle: No Muscle: No Joint: No Joint: No Joint: No Bone: No Bone: No Bone: No Epithelialization: Large (67-100%) None None Debridement: N/A Debridement - Excisional Debridement - Excisional Pre-procedure N/A 10:04 10:04 Verification/Time Out Taken: Pain Control: N/A Other Other Tissue Debrided: N/A Subcutaneous Muscle, Subcutaneous Level: N/A Skin/Subcutaneous Tissue Skin/Subcutaneous Tissue/Muscle Debridement Area (sq cm): N/A 0.36 0.48 Instrument: N/A Curette Curette Bleeding: N/A Minimum Minimum Hemostasis Achieved: N/A Pressure Pressure Procedural Pain: N/A 2 2 Post Procedural Pain: N/A 2 2 Debridement Treatment N/A Procedure was tolerated well Procedure was tolerated well Response: Post Debridement N/A 0.6x0.6x0.4 0.6x0.8x0.4 Measurements L x W x D (cm) Post Debridement Volume: N/A 0.113 0.151 (cm) Periwound Skin Texture: Excoriation: No Excoriation: No Excoriation: No Induration: No Induration: No Induration: No Callus: No Callus: No Callus: No Crepitus: No Crepitus: No Crepitus: No Rash: No Rash: No Rash: No Scarring: No Scarring: No Scarring: No Periwound Skin Moisture: Maceration: No Maceration: No Maceration: No Dry/Scaly: No Dry/Scaly: No Dry/Scaly: No Periwound Skin Color: Atrophie Blanche: No Atrophie Blanche: No Atrophie Blanche: No Cyanosis: No Cyanosis: No Cyanosis: No Ecchymosis: No Ecchymosis: No Ecchymosis: No Erythema: No Erythema: No Erythema: No Hemosiderin Staining: No Hemosiderin Staining: No Hemosiderin Staining: No Mottled: No Mottled: No Mottled: No Pallor: No Pallor: No Pallor: No Rubor: No Rubor: No Rubor:  No Temperature: No Abnormality No Abnormality No Abnormality Tenderness on Palpation: No Yes No Wound Preparation: Ulcer Cleansing: Ulcer Cleansing: Ulcer Cleansing: Rinsed/Irrigated with Saline, Rinsed/Irrigated with Saline, Rinsed/Irrigated with Saline, Other: soap and water Other: soap and water Other: soap and water Topical Anesthetic Applied: Topical Anesthetic Applied: Topical Anesthetic Applied: Other: lidocaine 4% Other: lidocaine 4% Other: lidocaine 4% Procedures Performed: N/A Debridement Debridement Wound Number: 9 N/A N/A Photos: N/A N/A AMES, HOBAN ([id number]) Wound Location: Left Metatarsal head first N/A N/A Wounding Event: Gradually Appeared N/A N/A Primary Etiology: Pressure Ulcer N/A N/A Comorbid History: Congestive Heart Failure, N/A N/A Coronary Artery Disease, Hypertension, Myocardial Infarction, Osteoarthritis Date Acquired: 05/06/2017 N/A N/A Weeks of Treatment: 0 N/A N/A Wound Status: Open N/A N/A Measurements L x W x D 0.5x0.5x0.1 N/A N/A (cm) Area (cm) : 0.196 N/A N/A Volume (cm) : 0.02 N/A N/A % Reduction in Area: N/A N/A N/A % Reduction in Volume: N/A N/A N/A Classification: Category/Stage II N/A N/A Exudate Amount: Medium N/A N/A Exudate Type: Serous N/A N/A Exudate Color: amber N/A N/A Wound Margin: Flat and Intact N/A N/A Granulation Amount: Medium (34-66%)  N/A N/A Granulation Quality: Pink N/A N/A Necrotic Amount: Medium (34-66%) N/A N/A Necrotic Tissue: Adherent Slough N/A N/A Exposed Structures: Fat Layer (Subcutaneous N/A N/A Tissue) Exposed: Yes Fascia: No Tendon: No Muscle: No Joint: No Bone: No Epithelialization: None N/A N/A Debridement: N/A N/A N/A Pain Control: N/A N/A N/A Tissue Debrided: N/A N/A N/A Level: N/A N/A N/A Debridement Area (sq cm): N/A N/A N/A Instrument: N/A N/A N/A Bleeding: N/A N/A N/A Hemostasis Achieved: N/A N/A N/A Procedural Pain: N/A N/A N/A Post Procedural Pain: N/A N/A  N/A Debridement Treatment N/A N/A N/A Response: Post Debridement N/A N/A N/A Measurements L x W x D (cm) Post Debridement Volume: N/A N/A N/A (cm) Periwound Skin Texture: Excoriation: No N/A N/A Induration: No Callus: No Crepitus: No Treto, Kolin E. ([id number]) Rash: No Scarring: No Periwound Skin Moisture: Maceration: No N/A N/A Dry/Scaly: No Periwound Skin Color: Atrophie Blanche: No N/A N/A Cyanosis: No Ecchymosis: No Erythema: No Hemosiderin Staining: No Mottled: No Pallor: No Rubor: No Temperature: N/A N/A N/A Tenderness on Palpation: No N/A N/A Wound Preparation: Ulcer Cleansing: N/A N/A Rinsed/Irrigated with Saline Topical Anesthetic Applied: Other: lidocaine 4% Procedures Performed: N/A N/A N/A Treatment Notes Wound #4 (Right, Proximal, Lateral Lower Leg) 1. Cleansed with: Clean wound with Normal Saline Cleanse wound with antibacterial soap and water 2. Anesthetic Topical Lidocaine 4% cream to wound bed prior to debridement 3. Peri-wound Care: Other peri-wound care (specify in notes) 4. Dressing Applied: Prisma Ag 5. Secondary Dressing Applied ABD Pad 7. Secured with 3 Layer Compression System - Right Lower Extremity Notes TCA Wound #6 (Right, Lateral Malleolus) 1. Cleansed with: Clean wound with Normal Saline Cleanse wound with antibacterial soap and water 2. Anesthetic Topical Lidocaine 4% cream to wound bed prior to debridement 3. Peri-wound Care: Other peri-wound care (specify in notes) 4. Dressing Applied: Prisma Ag 5. Secondary Dressing Applied ABD Pad 7. Secured with 3 Layer Compression System - Right Lower Extremity Kreiser, Kailer E. ([id number]) Notes TCA Wound #8 (Right, Lateral Foot) 1. Cleansed with: Clean wound with Normal Saline Cleanse wound with antibacterial soap and water 2. Anesthetic Topical Lidocaine 4% cream to wound bed prior to debridement 3. Peri-wound Care: Other peri-wound care (specify in notes) 4.  Dressing Applied: Prisma Ag 5. Secondary Dressing Applied ABD Pad 7. Secured with 3 Layer Compression System - Right Lower Extremity Notes TCA Wound #9 (Left Metatarsal head first) 1. Cleansed with: Clean wound with Normal Saline Cleanse wound with antibacterial soap and water 2. Anesthetic Topical Lidocaine 4% cream to wound bed prior to debridement 3. Peri-wound Care: Other peri-wound care (specify in notes) 4. Dressing Applied: Prisma Ag 5. Secondary Dressing Applied ABD Pad 7. Secured with 3 Layer Compression System - Right Lower Extremity Notes TCA Electronic Signature(s) Signed: 05/20/2017 4:50:03 PM By: Linton Ham MD Entered By: Linton Ham on 05/20/2017 16:39:25 Shock, [name] ([id number]) -------------------------------------------------------------------------------- Multi-Disciplinary Care Plan Details Patient Name: [name] Date of Service: 05/20/2017 9:15 AM Medical Record Number: [id number] Patient Account Number: [id number] Date of Birth/Sex: December 13, 1940 (76 y.o. M) Treating RN: Cornell Barman Primary Care Keyshla Tunison: Cyndi Bender Other Clinician: Referring Jazlynn Nemetz: Cyndi Bender Treating Kazim Corrales/Extender: Tito Dine in Treatment: 6 Active Inactive ` Abuse / Safety / Falls / Self Care Management Nursing Diagnoses: History of Falls Goals: Patient will remain injury free related to falls Date Initiated: 04/08/2017 Target Resolution Date: 05/08/2017 Goal Status: Active Interventions: Assess fall risk on admission and as needed Notes: ` Orientation to the Wound Care Program  Nursing Diagnoses: Knowledge deficit related to the wound healing center program Goals: Patient/caregiver will verbalize understanding of the [facility] Date Initiated: 04/08/2017 Target Resolution Date: 05/08/2017 Goal Status: Active Interventions: Provide education on orientation to the wound center Notes: ` Soft Tissue  Infection Nursing Diagnoses: Impaired tissue integrity Potential for infection: soft tissue Goals: Patient will remain free of wound infection Date Initiated: 04/08/2017 Target Resolution Date: 05/08/2017 Goal Status: Active AUDRY, PECINA ([id number]) Interventions: Assess signs and symptoms of infection every visit Notes: ` Wound/Skin Impairment Nursing Diagnoses: Impaired tissue integrity Goals: Ulcer/skin breakdown will heal within 14 weeks Date Initiated: 04/08/2017 Target Resolution Date: 07/20/2017 Goal Status: Active Interventions: Assess patient/caregiver ability to perform ulcer/skin care regimen upon admission and as needed Provide education on ulcer and skin care Treatment Activities: Topical wound management initiated : 04/08/2017 Notes: Electronic Signature(s) Signed: 05/20/2017 4:52:44 PM By: Gretta Cool, BSN, RN, CWS, Kim RN, BSN Entered By: Gretta Cool, BSN, RN, CWS, Kim on 05/20/2017 10:05:59 Roberti, [name] ([id number]) -------------------------------------------------------------------------------- Pain Assessment Details Patient Name: [name] Date of Service: 05/20/2017 9:15 AM Medical Record Number: [id number] Patient Account Number: [id number] Date of Birth/Sex: 13-Sep-1940 (76 y.o. M) Treating RN: Roger Shelter Primary Care Jamilla Galli: Cyndi Bender Other Clinician: Referring Johnay Mano: Cyndi Bender Treating Rolande Moe/Extender: Tito Dine in Treatment: 6 Active Problems Location of Pain Severity and Description of Pain Patient Has Paino No Site Locations Pain Management and Medication Current Pain Management: Electronic Signature(s) Signed: 05/20/2017 3:23:03 PM By: Roger Shelter Entered By: Roger Shelter on 05/20/2017 09:39:05 Tucker, [name] ([id number]) -------------------------------------------------------------------------------- Patient/Caregiver Education Details Patient Name: [name] Date of Service:  05/20/2017 9:15 AM Medical Record Number: [id number] Patient Account Number: [id number] Date of Birth/Gender: 03/07/1940 (76 y.o. M) Treating RN: Montey Hora Primary Care Physician: Cyndi Bender Other Clinician: Referring Physician: Cyndi Bender Treating Physician/Extender: Tito Dine in Treatment: 6 Education Assessment Education Provided To: Patient Education Topics Provided Venous: Handouts: Other: leg elevation Methods: Explain/Verbal Responses: State content correctly Electronic Signature(s) Signed: 05/20/2017 4:31:04 PM By: Montey Hora Entered By: Montey Hora on 05/20/2017 11:51:54 Carillo, [name] ([id number]) -------------------------------------------------------------------------------- Wound Assessment Details Patient Name: [name] Date of Service: 05/20/2017 9:15 AM Medical Record Number: [id number] Patient Account Number: [id number] Date of Birth/Sex: Jan 13, 1940 (76 y.o. M) Treating RN: Roger Shelter Primary Care Garo Heidelberg: Cyndi Bender Other Clinician: Referring Pattie Flaharty: Cyndi Bender Treating Jonee Lamore/Extender: Tito Dine in Treatment: 6 Wound Status Wound Number: 4 Primary Arterial Insufficiency Ulcer Etiology: Wound Location: Right Lower Leg - Lateral, Proximal Wound Open Wounding Event: Gradually Appeared Status: Date Acquired: 04/08/2017 Comorbid Congestive Heart Failure, Coronary Artery Weeks Of Treatment: 6 History: Disease, Hypertension, Myocardial Infarction, Clustered Wound: No Osteoarthritis Photos Photo Uploaded By: Roger Shelter on 05/20/2017 15:31:58 Wound Measurements Length: (cm) 0.1 Width: (cm) 0.1 Depth: (cm) 0.1 Area: (cm) 0.008 Volume: (cm) 0.001 % Reduction in Area: 95.9% % Reduction in Volume: 98.3% Epithelialization: Large (67-100%) Tunneling: No Undermining: No Wound Description Full Thickness Without Exposed Support Classification: Structures Wound Margin: Flat  and Intact Exudate None Present Amount: Foul Odor After Cleansing: No Slough/Fibrino Yes Wound Bed Granulation Amount: None Present (0%) Exposed Structure Necrotic Amount: Large (67-100%) Fascia Exposed: No Necrotic Quality: Eschar Fat Layer (Subcutaneous Tissue) Exposed: No Tendon Exposed: No Muscle Exposed: No Joint Exposed: No Bone Exposed: No Periwound Skin Texture Behlke, Quantavious E. ([id number]) Texture Color No Abnormalities Noted: No No Abnormalities Noted: No Callus: No Atrophie Blanche: No Crepitus: No Cyanosis: No Excoriation: No  Ecchymosis: No Induration: No Erythema: No Rash: No Hemosiderin Staining: No Scarring: No Mottled: No Pallor: No Moisture Rubor: No No Abnormalities Noted: No Dry / Scaly: No Temperature / Pain Maceration: No Temperature: No Abnormality Wound Preparation Ulcer Cleansing: Rinsed/Irrigated with Saline, Other: soap and water, Topical Anesthetic Applied: Other: lidocaine 4%, Treatment Notes Wound #4 (Right, Proximal, Lateral Lower Leg) 1. Cleansed with: Clean wound with Normal Saline Cleanse wound with antibacterial soap and water 2. Anesthetic Topical Lidocaine 4% cream to wound bed prior to debridement 3. Peri-wound Care: Other peri-wound care (specify in notes) 4. Dressing Applied: Prisma Ag 5. Secondary Dressing Applied ABD Pad 7. Secured with 3 Layer Compression System - Right Lower Extremity Notes TCA Electronic Signature(s) Signed: 05/20/2017 3:23:03 PM By: Roger Shelter Entered By: Roger Shelter on 05/20/2017 09:50:04 Kiehn, [name] ([id number]) -------------------------------------------------------------------------------- Wound Assessment Details Patient Name: [name] Date of Service: 05/20/2017 9:15 AM Medical Record Number: [id number] Patient Account Number: [id number] Date of Birth/Sex: November 19, 1940 (76 y.o. M) Treating RN: Roger Shelter Primary Care Nasrin Lanzo: Cyndi Bender Other  Clinician: Referring Chrisma Hurlock: Cyndi Bender Treating Calvin Chura/Extender: Tito Dine in Treatment: 6 Wound Status Wound Number: 6 Primary Arterial Insufficiency Ulcer Etiology: Wound Location: Right Malleolus - Lateral Wound Open Wounding Event: Gradually Appeared Status: Date Acquired: 03/23/2017 Comorbid Congestive Heart Failure, Coronary Artery Weeks Of Treatment: 6 History: Disease, Hypertension, Myocardial Infarction, Clustered Wound: No Osteoarthritis Photos Photo Uploaded By: Roger Shelter on 05/20/2017 15:31:59 Wound Measurements Length: (cm) 0.6 Width: (cm) 0.6 Depth: (cm) 0.2 Area: (cm) 0.283 Volume: (cm) 0.057 % Reduction in Area: -812.9% % Reduction in Volume: -1800% Epithelialization: None Tunneling: No Undermining: No Wound Description Full Thickness Without Exposed Support Classification: Structures Wound Margin: Distinct, outline attached Exudate Medium Amount: Exudate Type: Serosanguineous Exudate Color: red, brown Foul Odor After Cleansing: No Slough/Fibrino Yes Wound Bed Granulation Amount: None Present (0%) Exposed Structure Necrotic Amount: Large (67-100%) Fascia Exposed: No Necrotic Quality: Eschar, Adherent Slough Fat Layer (Subcutaneous Tissue) Exposed: No Tendon Exposed: No Muscle Exposed: No Joint Exposed: No Bone Exposed: No Bovard, Yvonne E. ([id number]) Periwound Skin Texture Texture Color No Abnormalities Noted: No No Abnormalities Noted: No Callus: No Atrophie Blanche: No Crepitus: No Cyanosis: No Excoriation: No Ecchymosis: No Induration: No Erythema: No Rash: No Hemosiderin Staining: No Scarring: No Mottled: No Pallor: No Moisture Rubor: No No Abnormalities Noted: No Dry / Scaly: No Temperature / Pain Maceration: No Temperature: No Abnormality Tenderness on Palpation: Yes Wound Preparation Ulcer Cleansing: Rinsed/Irrigated with Saline, Other: soap and water, Topical Anesthetic  Applied: Other: lidocaine 4%, Treatment Notes Wound #6 (Right, Lateral Malleolus) 1. Cleansed with: Clean wound with Normal Saline Cleanse wound with antibacterial soap and water 2. Anesthetic Topical Lidocaine 4% cream to wound bed prior to debridement 3. Peri-wound Care: Other peri-wound care (specify in notes) 4. Dressing Applied: Prisma Ag 5. Secondary Dressing Applied ABD Pad 7. Secured with 3 Layer Compression System - Right Lower Extremity Notes TCA Electronic Signature(s) Signed: 05/20/2017 3:23:03 PM By: Roger Shelter Entered By: Roger Shelter on 05/20/2017 09:49:37 Kershaw, [name] ([id number]) -------------------------------------------------------------------------------- Wound Assessment Details Patient Name: [name] Date of Service: 05/20/2017 9:15 AM Medical Record Number: [id number] Patient Account Number: [id number] Date of Birth/Sex: 1940-11-14 (76 y.o. M) Treating RN: Roger Shelter Primary Care Javione Gunawan: Cyndi Bender Other Clinician: Referring Chyenne Sobczak: Cyndi Bender Treating Zurie Platas/Extender: Tito Dine in Treatment: 6 Wound Status Wound Number: 8 Primary Arterial Insufficiency Ulcer Etiology: Wound Location: Right Foot - Lateral Wound Open Wounding  Event: Gradually Appeared Status: Date Acquired: 04/29/2017 Comorbid Congestive Heart Failure, Coronary Artery Weeks Of Treatment: 3 History: Disease, Hypertension, Myocardial Infarction, Clustered Wound: No Osteoarthritis Photos Photo Uploaded By: Roger Shelter on 05/20/2017 15:32:50 Wound Measurements Length: (cm) 0.6 Width: (cm) 0.8 Depth: (cm) 0.2 Area: (cm) 0.377 Volume: (cm) 0.075 % Reduction in Area: -431% % Reduction in Volume: -971.4% Epithelialization: None Tunneling: No Undermining: No Wound Description Classification: Partial Thickness Wound Margin: Flat and Intact Exudate Amount: Medium Exudate Type: Serosanguineous Exudate Color:  red, brown Foul Odor After Cleansing: No Slough/Fibrino Yes Wound Bed Granulation Amount: None Present (0%) Exposed Structure Necrotic Amount: Large (67-100%) Fascia Exposed: No Necrotic Quality: Eschar, Adherent Slough Fat Layer (Subcutaneous Tissue) Exposed: No Tendon Exposed: No Muscle Exposed: No Joint Exposed: No Bone Exposed: No Periwound Skin Texture Weist, Timtohy E. ([id number]) Texture Color No Abnormalities Noted: No No Abnormalities Noted: No Callus: No Atrophie Blanche: No Crepitus: No Cyanosis: No Excoriation: No Ecchymosis: No Induration: No Erythema: No Rash: No Hemosiderin Staining: No Scarring: No Mottled: No Pallor: No Moisture Rubor: No No Abnormalities Noted: No Dry / Scaly: No Temperature / Pain Maceration: No Temperature: No Abnormality Wound Preparation Ulcer Cleansing: Rinsed/Irrigated with Saline, Other: soap and water, Topical Anesthetic Applied: Other: lidocaine 4%, Treatment Notes Wound #8 (Right, Lateral Foot) 1. Cleansed with: Clean wound with Normal Saline Cleanse wound with antibacterial soap and water 2. Anesthetic Topical Lidocaine 4% cream to wound bed prior to debridement 3. Peri-wound Care: Other peri-wound care (specify in notes) 4. Dressing Applied: Prisma Ag 5. Secondary Dressing Applied ABD Pad 7. Secured with 3 Layer Compression System - Right Lower Extremity Notes TCA Electronic Signature(s) Signed: 05/20/2017 3:23:03 PM By: Roger Shelter Entered By: Roger Shelter on 05/20/2017 09:49:06 Lamour, [name] ([id number]) -------------------------------------------------------------------------------- Wound Assessment Details Patient Name: [name] Date of Service: 05/20/2017 9:15 AM Medical Record Number: [id number] Patient Account Number: [id number] Date of Birth/Sex: November 03, 1940 (76 y.o. M) Treating RN: Roger Shelter Primary Care Jovannie Ulibarri: Cyndi Bender Other Clinician: Referring Jelina Paulsen:  Cyndi Bender Treating Yadira Hada/Extender: Tito Dine in Treatment: 6 Wound Status Wound Number: 9 Primary Pressure Ulcer Etiology: Wound Location: Left Metatarsal head first Wound Open Wounding Event: Gradually Appeared Status: Date Acquired: 05/06/2017 Comorbid Congestive Heart Failure, Coronary Artery Weeks Of Treatment: 0 History: Disease, Hypertension, Myocardial Infarction, Clustered Wound: No Osteoarthritis Photos Photo Uploaded By: Roger Shelter on 05/20/2017 15:32:51 Wound Measurements Length: (cm) 0.5 Width: (cm) 0.5 Depth: (cm) 0.1 Area: (cm) 0.196 Volume: (cm) 0.02 % Reduction in Area: % Reduction in Volume: Epithelialization: None Tunneling: No Undermining: No Wound Description Classification: Category/Stage II Wound Margin: Flat and Intact Exudate Amount: Medium Exudate Type: Serous Exudate Color: amber Foul Odor After Cleansing: No Slough/Fibrino Yes Wound Bed Granulation Amount: Medium (34-66%) Exposed Structure Granulation Quality: Pink Fascia Exposed: No Necrotic Amount: Medium (34-66%) Fat Layer (Subcutaneous Tissue) Exposed: Yes Necrotic Quality: Adherent Slough Tendon Exposed: No Muscle Exposed: No Joint Exposed: No Bone Exposed: No Periwound Skin Texture [place], Turner E. ([id number]) Texture Color No Abnormalities Noted: No No Abnormalities Noted: No Callus: No Atrophie Blanche: No Crepitus: No Cyanosis: No Excoriation: No Ecchymosis: No Induration: No Erythema: No Rash: No Hemosiderin Staining: No Scarring: No Mottled: No Pallor: No Moisture Rubor: No No Abnormalities Noted: No Dry / Scaly: No Maceration: No Wound Preparation Ulcer Cleansing: Rinsed/Irrigated with Saline Topical Anesthetic Applied: Other: lidocaine 4%, Treatment Notes Wound #9 (Left Metatarsal head first) 1. Cleansed with: Clean wound with Normal Saline Cleanse wound with antibacterial soap  and water 2. Anesthetic Topical  Lidocaine 4% cream to wound bed prior to debridement 3. Peri-wound Care: Other peri-wound care (specify in notes) 4. Dressing Applied: Prisma Ag 5. Secondary Dressing Applied ABD Pad 7. Secured with 3 Layer Compression System - Right Lower Extremity Notes TCA Electronic Signature(s) Signed: 05/20/2017 3:23:03 PM By: Roger Shelter Entered By: Roger Shelter on 05/20/2017 09:56:59 Bow, [name] ([id number]) -------------------------------------------------------------------------------- Vitals Details Patient Name: [name] Date of Service: 05/20/2017 9:15 AM Medical Record Number: [id number] Patient Account Number: [id number] Date of Birth/Sex: [birth year] (76 y.o. M) Treating RN: Roger Shelter Primary Care Tanielle Emigh: Cyndi Bender Other Clinician: Referring Jimel Myler: Cyndi Bender Treating Alyanna Stoermer/Extender: Tito Dine in Treatment: 6 Vital Signs Time Taken: 09:39 Temperature (F): 97.6 Height (in): 68 Pulse (bpm): 66 Respiratory Rate (breaths/min): 16 Blood Pressure (mmHg): 99/42 Reference Range: 80 - 120 mg / dl Electronic Signature(s) Signed: 05/20/2017 3:23:03 PM By: Roger Shelter Entered By: Roger Shelter on 05/20/2017 09:39:25

## 2017-05-22 NOTE — Progress Notes (Signed)
[name], [name] ([id number]) Visit Report for 05/20/2017 Debridement Details Patient Name: [name], [name] Date of Service: 05/20/2017 9:15 AM Medical Record Number: [id number] Patient Account Number: [id number] Date of Birth/Sex: April 16, 1940 (76 y.o. M) Treating RN: Cornell Barman Primary Care Provider: Cyndi Bender Other Clinician: Referring Provider: Cyndi Bender Treating Provider/Extender: Tito Dine in Treatment: 6 Debridement Performed for Wound #6 Right,Lateral Malleolus Assessment: Performed By: Physician Ricard Dillon, MD Debridement Type: Debridement Severity of Tissue Pre Fat layer exposed Debridement: Pre-procedure Verification/Time Yes - 10:04 Out Taken: Start Time: 10:05 Pain Control: Other : lidocaine 4% Total Area Debrided (L x W): 0.6 (cm) x 0.6 (cm) = 0.36 (cm) Tissue and other material Viable, Non-Viable, Subcutaneous debrided: Level: Skin/Subcutaneous Tissue Debridement Description: Excisional Instrument: Curette Bleeding: Minimum Hemostasis Achieved: Pressure End Time: 10:07 Procedural Pain: 2 Post Procedural Pain: 2 Response to Treatment: Procedure was tolerated well Level of Consciousness: Awake and Alert Post Procedure Vitals: Temperature: 97.6 Pulse: 66 Respiratory Rate: 16 Blood Pressure: Systolic Blood Pressure: 99 Diastolic Blood Pressure: 42 Post Debridement Measurements of Total Wound Length: (cm) 0.6 Width: (cm) 0.6 Depth: (cm) 0.4 Volume: (cm) 0.113 Character of Wound/Ulcer Post Debridement: Stable Severity of Tissue Post Debridement: Fat layer exposed Post Procedure Diagnosis Same as Pre-procedure [name], [name] ([id number]) Electronic Signature(s) Signed: 05/20/2017 4:50:03 PM By: Linton Ham MD Signed: 05/20/2017 4:52:44 PM By: Gretta Cool, BSN, RN, CWS, Kim RN, BSN Entered By: Linton Ham on 05/20/2017 16:39:38 [name], [name]  ([id number]) -------------------------------------------------------------------------------- Debridement Details Patient Name: [name] Date of Service: 05/20/2017 9:15 AM Medical Record Number: [id number] Patient Account Number: [id number] Date of Birth/Sex: 1940-03-05 (76 y.o. M) Treating RN: Cornell Barman Primary Care Provider: Cyndi Bender Other Clinician: Referring Provider: Cyndi Bender Treating Provider/Extender: Tito Dine in Treatment: 6 Debridement Performed for Wound #8 Right,Lateral [name] Assessment: Performed By: Physician Ricard Dillon, MD Debridement Type: Debridement Severity of Tissue Pre Muscle involvement without necrosis Debridement: Pre-procedure Verification/Time Yes - 10:04 Out Taken: Start Time: 10:05 Pain Control: Other : lidocaine 4% Total Area Debrided (L x W): 0.6 (cm) x 0.8 (cm) = 0.48 (cm) Tissue and other material Viable, Non-Viable, Muscle, Subcutaneous debrided: Level: Skin/Subcutaneous Tissue/Muscle Debridement Description: Excisional Instrument: Curette Bleeding: Minimum Hemostasis Achieved: Pressure End Time: 10:07 Procedural Pain: 2 Post Procedural Pain: 2 Response to Treatment: Procedure was tolerated well Level of Consciousness: Awake and Alert Post Procedure Vitals: Temperature: 97.6 Pulse: 66 Respiratory Rate: 16 Blood Pressure: Systolic Blood Pressure: 99 Diastolic Blood Pressure: 42 Post Debridement Measurements of Total Wound Length: (cm) 0.6 Width: (cm) 0.8 Depth: (cm) 0.4 Volume: (cm) 0.151 Character of Wound/Ulcer Post Debridement: Stable Severity of Tissue Post Debridement: Muscle involvement without necrosis Post Procedure Diagnosis Same as Pre-procedure Electronic Signature(s) Signed: 05/20/2017 4:50:03 PM By: Linton Ham MD Signed: 05/20/2017 4:52:44 PM By: Gretta Cool, BSN, RN, CWS, Kim RN, BSN [name], [name] ([id number]) Previous Signature: 05/20/2017 10:17:39 AM Version By:  Gretta Cool, BSN, RN, CWS, Kim RN, BSN Entered By: Linton Ham on 05/20/2017 16:39:49 [name], [name] ([id number]) -------------------------------------------------------------------------------- HPI Details Patient Name: [name] Date of Service: 05/20/2017 9:15 AM Medical Record Number: [id number] Patient Account Number: [id number] Date of Birth/Sex: Mar 01, 1940 (76 y.o. M) Treating RN: Cornell Barman Primary Care Provider: Cyndi Bender Other Clinician: Referring Provider: Cyndi Bender Treating Provider/Extender: Tito Dine in Treatment: 6 History of Present Illness HPI Description: 04/08/17; this is a complex 77 year old man referred here from [facility]. He had been referred there  for bilateral lower extremity edema with ulcer formation predominantly on the right calf but also the right [name]. He had been receiving Unna boots bilaterally. The history here is long. He is not a diabetic however ICU looking through late 2018 he was worked up for chronic headaches, elevated inflammatory markers including C-reactive protein and ESR. He went on to actually have a left temporal artery biopsy wasn't that was negative.he received about 6 weeks of high-dose prednisone 60 mg with improvement in his inflammatory markers. He was admitted to hospital in late November with ventricular tachycardia syncope. He has known ischemic cardiomyopathy. He was admitted in the hospital in mid December. Apparently this was precipitated by a syncopal spell falling out of his scooter while at [place]. There was ventricular arrhythmia. He has an implantable defibrillator and echocardiogram showed severe LV dysfunction with an EF of 20% and valvular regurgitations including mild AR, moderate MR. There was no stenosis. He ruled in for a non-ST elevation MI in the setting of V. tach.his wife states that sometime during this hospitalization she noted multiple areas of skin change on the  right lower calf which became evident just after he left the hospital. He was back in hospital in February with acute renal failure hyponatremia. This responded to fluid resuscitation.. Interestingly I can't see much description of his right leg at that point in time.he was followed by Dr. Nehemiah Massed of dermatology for the necrotic wounds on his right leg. Apparently a biopsy was planned at one point but not done although in some notes that suggests it was. I cannot see these results area He was noted to have a lot of edema. Was treated with bilateral Unna boots edges really helped with the swelling they have been using Bactroban to small open areas predominantly on the right anterior lower leg His history is complicated by the fact that he has rheumatoid arthritis followed by rheumatology. He is followed by neurology for disabling headaches. At one point this was felt to be giant cell arteritis although a left temporal biopsy was apparently negative. He was given a prolonged course of prednisone at 60 mg which managed his sedimentation rates but apparently did not prove improve the headaches. This is been tapered to off on by rheumatology on 03/13/17 Vascular had plans to do a venous reflux workup as well as arterial studies in May. They also wanted to get him a lymphedema pump. As mentioned he's been using bacitracin under Unna boot wraps to both lower legs 04/15/17; the patient arrives with most of his wounds improved. These are small punched out wounds. Most of them remaining ones are on the right anterior calf with the most problematic over the right lateral malleolus. There are no new areas. The symptom complex or potential symptom complex we are dealing with his chronic disabling headaches with inflammatory markers not responsive to prednisone and with a negative temporal biopsy, lower extremity weakness, skin ulcerations just on the right leg. We have managed to get his arterial studies moved up  to April 30. He has a rheumatology consult at [facility] in June. He has seen dermatology locally, rheumatology locally, neurology locally. 04/22/17; small punched out areas on the right leg anteriorly posteriorly. Most of these appear to have closed over. Some of them have eschar over the surface. The most problematic area appears to be over the right lateral malleolus. We've been using prisma to all of this. He has arterial studies on April 30 and a rheumatology consult at [facility] on  June 28 04/29/17; most of the small punched out areas on the right leg posteriorly are closed. He has 2 or 3 openings anteriorly but most of these appear to be on the way to closing. Still problematically over the right lateral malleolus and right lateral [name] with almost ischemic-looking eschar. His arterial studies that I ordered are due to be done next week on the 30th so we should have them available for our next visit hopefully. He also saw a rheumatologist at [facility] and according to the patient he did 8 vials of blood. Finally he has scaling rash on his left [name] and what looks to be at [employer] [facility] [employer] area on the left anterior leg 05/06/17; most of the small punched out areas on the right leg posteriorly and anteriorly are closed. He still has one small one anteriorly one over the right lateral malleolus and one over the right lateral [name]. LANSON, RANDLE ([id number]) He had his arterial studies they didn't seem to do waveform analysis not exactly sure why however in any case is ABIs were noncompressible bilaterally. They did provide TBIs although looking at the pressures it appears that his TBIs are quite normal. I therefore went ahead and debrided the area over the right lateral malleolus and the right lateral [name] 05/13/17; most of the small punched out areas on the right leg posteriorly and anteriorly are closed. He continues to have problematic areas over the right lateral malleolus and the right lateral [name]. He still  requiring aggressive debridement of these 2 wounds using silver collagen I reviewed the note from rheumatology I don't think they came up with a specific diagnosis although he is known to have seropositive RA. They did a panel of lab work when I was able to see his his AMA was negative, anti-smooth muscle antibodies negative antineutrophil cytoplasmic antibodies negative,Liv/kia type 1 negative. Serum C3 and C4 were negative. I don't see his muscle enzymes specifically. He was referred back to his local rheumatologist for management of his known rheumatoid arthritis.the patient states he still feels weak and fatigued. He states he has numbness in both feet and apparently is known to have neuropathy 05/20/17; the patient continues to have a difficult problem on the right lateral malleolus and the right lateral [name]. Both of these wounds have no viable surface even with attempts at debridement. He has a new wound on the left plantar metatarsal head which looks more like a superficial diabetic pressure related injury then part of this underlying issue he has. Most of the rest of the wounds on his legs look satisfactory. Mostly on the right calf.reviewed his arterial studies which showed noncompressible vessels bilaterally but the TBIs were quite normal. Electronic Signature(s) Signed: 05/20/2017 4:50:03 PM By: Linton Ham MD Entered By: Linton Ham on 05/20/2017 [place], [name] ([id number]) -------------------------------------------------------------------------------- Physical Exam Details Patient Name: [name] Date of Service: 05/20/2017 9:15 AM Medical Record Number: [id number] Patient Account Number: [id number] Date of Birth/Sex: 1940/09/26 (76 y.o. M) Treating RN: Cornell Barman Primary Care Provider: Cyndi Bender Other Clinician: Referring Provider: Cyndi Bender Treating Provider/Extender: Tito Dine in Treatment: 6 Notes exam; multiple areas on  the right posterior and right anterior calf are healed are scabbed over. oHe continues to have problematic areas over the right lateral malleolus in the right lateral [name] we debrided this with a #3 curet to remove necrotic subcutaneous tissue however there is not much viable here including necrotic muscle and on the lateral [name] this is precariously  close to bone. There is no evidence of surrounding infection Electronic Signature(s) Signed: 05/20/2017 4:50:03 PM By: Linton Ham MD Entered By: Linton Ham on 05/20/2017 16:43:56 [place], [name] ([id number]) -------------------------------------------------------------------------------- Physician Orders Details Patient Name: [name] Date of Service: 05/20/2017 9:15 AM Medical Record Number: [id number] Patient Account Number: [id number] Date of Birth/Sex: 05-Feb-1940 (76 y.o. M) Treating RN: Cornell Barman Primary Care Provider: Cyndi Bender Other Clinician: Referring Provider: Cyndi Bender Treating Provider/Extender: Tito Dine in Treatment: 6 Verbal / Phone Orders: No Diagnosis Coding Wound Cleansing Wound #4 Right,Proximal,Lateral Lower Leg o Clean wound with Normal Saline. Wound #6 Right,Lateral Malleolus o Clean wound with Normal Saline. Wound #8 Right,Lateral [name] o Clean wound with Normal Saline. Wound #9 Left Metatarsal head first o Clean wound with Normal Saline. Anesthetic (add to Medication List) Wound #4 Right,Proximal,Lateral Lower Leg o Topical Lidocaine 4% cream applied to wound bed prior to debridement (In Clinic Only). Wound #6 Right,Lateral Malleolus o Topical Lidocaine 4% cream applied to wound bed prior to debridement (In Clinic Only). Wound #8 Right,Lateral [name] o Topical Lidocaine 4% cream applied to wound bed prior to debridement (In Clinic Only). Wound #9 Left Metatarsal head first o Topical Lidocaine 4% cream applied to wound bed prior to debridement (In Clinic  Only). Skin Barriers/Peri-Wound Care Wound #4 Right,Proximal,Lateral Lower Leg o Triamcinolone Acetonide Ointment (TAC) Wound #6 Right,Lateral Malleolus o Triamcinolone Acetonide Ointment (TAC) Wound #8 Right,Lateral [name] o Triamcinolone Acetonide Ointment (TAC) Primary Wound Dressing Wound #4 Right,Proximal,Lateral Lower Leg o Silver Collagen - all wounds Wound #6 Right,Lateral Malleolus o Silver Collagen - all wounds Wound #8 Right,Lateral [name] Razon, JACQUES FIFE ([id number]) o Silver Collagen - all wounds Wound #9 Left Metatarsal head first o Silver Collagen - all wounds Secondary Dressing Wound #4 Right,Proximal,Lateral Lower Leg o ABD pad - over collegen, Unna paste to [place] at top Wound #6 Right,Lateral Malleolus o ABD pad - over collegen, Unna paste to An[place] at top Wound #8 Right,Lateral [name] o ABD pad - over collegen, Unna paste to [place] at top Wound #9 Left Metatarsal head first o ABD and Kerlix/Conform Dressing Change Frequency Wound #4 Right,Proximal,Lateral Lower Leg o Change Dressing Monday, Wednesday, Friday Wound #6 Right,Lateral Malleolus o Change Dressing Monday, Wednesday, Friday Wound #8 Right,Lateral [name] o Change Dressing Monday, Wednesday, Friday Wound #9 Left Metatarsal head first o Change Dressing Monday, Wednesday, Friday Follow-up Appointments Wound #4 Right,Proximal,Lateral Lower Leg o Return Appointment in 1 week. Wound #6 Right,Lateral Malleolus o Return Appointment in 1 week. Wound #8 Right,Lateral [name] o Return Appointment in 1 week. Wound #9 Left Metatarsal head first o Return Appointment in 1 week. Edema Control Wound #4 Right,Proximal,Lateral Lower Leg o 3 Layer Compression System - Right Lower Extremity Wound #6 Right,Lateral Malleolus o 3 Layer Compression System - Right Lower Extremity Wound #8 Right,Lateral [name] o 3 Layer Compression System - Right Lower Extremity Home  Health [name], [name] ([id number]) Wound #4 Right,Proximal,Lateral Lower Leg o Continue Home Health Visits o Home Health Nurse may visit PRN to address patientos wound care needs. o FACE TO FACE ENCOUNTER: MEDICARE and MEDICAID PATIENTS: I certify that this patient is under my care and that I had a face-to-face encounter that meets the physician face-to-face encounter requirements with this patient on this date. The encounter with the patient was in whole or in part for the following MEDICAL CONDITION: (primary reason for [place]) MEDICAL NECESSITY: I certify, that based on my findings, NURSING services are a medically  necessary home health service. HOME BOUND STATUS: I certify that my clinical findings support that this patient is homebound (i.e., Due to illness or injury, pt requires aid of supportive devices such as crutches, cane, wheelchairs, walkers, the use of special transportation or the assistance of another person to leave their place of residence. There is a normal inability to leave the home and doing so requires considerable and taxing effort. Other absences are for medical reasons / religious services and are infrequent or of short duration when for other reasons). o If current dressing causes regression in wound condition, may D/C ordered dressing product/s and apply Normal Saline Moist Dressing daily until next [place] / Other MD appointment. [place] of regression in wound condition at [phone]. o Please direct any NON-WOUND related issues/requests for orders to patient's Primary Care Physician Wound #6 [place] Nurse may visit PRN to address patientos wound care needs. o FACE TO FACE ENCOUNTER: MEDICARE and MEDICAID PATIENTS: I certify that this patient is under my care and that I had a face-to-face encounter that meets the physician face-to-face  encounter requirements with this patient on this date. The encounter with the patient was in whole or in part for the following MEDICAL CONDITION: (primary reason for [place]) MEDICAL NECESSITY: I certify, that based on my findings, NURSING services are a medically necessary home health service. HOME BOUND STATUS: I certify that my clinical findings support that this patient is homebound (i.e., Due to illness or injury, pt requires aid of supportive devices such as crutches, cane, wheelchairs, walkers, the use of special transportation or the assistance of another person to leave their place of residence. There is a normal inability to leave the home and doing so requires considerable and taxing effort. Other absences are for medical reasons / religious services and are infrequent or of short duration when for other reasons). o If current dressing causes regression in wound condition, may D/C ordered dressing product/s and apply Normal Saline Moist Dressing daily until next [place] / Other MD appointment. [place] of regression in wound condition at [phone]. o Please direct any NON-WOUND related issues/requests for orders to patient's Primary Care Physician Wound #8 [place] Nurse may visit PRN to address patientos wound care needs. o FACE TO FACE ENCOUNTER: MEDICARE and MEDICAID PATIENTS: I certify that this patient is under my care and that I had a face-to-face encounter that meets the physician face-to-face encounter requirements with this patient on this date. The encounter with the patient was in whole or in part for the following MEDICAL CONDITION: (primary reason for [place]) MEDICAL NECESSITY: I certify, that based on my findings, NURSING services are a medically necessary home health service. HOME BOUND STATUS: I certify that my clinical findings support that this patient  is homebound (i.e., Due to illness or injury, pt requires aid of supportive devices such as crutches, cane, wheelchairs, walkers, the use of special transportation or the assistance of another person to leave their place of residence. There is a normal inability to leave the home and doing so requires considerable and taxing effort. Other absences are for medical reasons / religious services and are infrequent or of short duration when for other reasons). o If current dressing causes regression in wound condition, may D/C ordered dressing product/s and apply Normal Saline Moist Dressing daily until next [facility] / Other MD appointment. [place] of regression in wound condition at [phone]. o Please direct any NON-WOUND related issues/requests for orders to patient's Primary Care Physician Wound #9 Left Metatarsal head first o [place] Nurse may visit PRN to address patientos wound care needs. TYRIQ, [name] ([id number]) o FACE TO FACE ENCOUNTER: MEDICARE and MEDICAID PATIENTS: I certify that this patient is under my care and that I had a face-to-face encounter that meets the physician face-to-face encounter requirements with this patient on this date. The encounter with the patient was in whole or in part for the following MEDICAL CONDITION: (primary reason for [place]) MEDICAL NECESSITY: I certify, that based on my findings, NURSING services are a medically necessary home health service. HOME BOUND STATUS: I certify that my clinical findings support that this patient is homebound (i.e., Due to illness or injury, pt requires aid of supportive devices such as crutches, cane, wheelchairs, walkers, the use of special transportation or the assistance of another person to leave their place of residence. There is a normal inability to leave the home and doing so requires considerable and taxing effort. Other  absences are for medical reasons / religious services and are infrequent or of short duration when for other reasons). o If current dressing causes regression in wound condition, may D/C ordered dressing product/s and apply Normal Saline Moist Dressing daily until next [place] / Other MD appointment. [place] of regression in wound condition at [phone]. o Please direct any NON-WOUND related issues/requests for orders to patient's Primary Care Physician Medications-please add to medication list. Wound #4 Right,Proximal,Lateral Lower Leg o P.O. Antibiotics - Doxycycine Wound #6 Right,Lateral Malleolus o P.O. Antibiotics - Doxycycine Wound #8 Right,Lateral [name] o P.O. Antibiotics - Doxycycine Wound #9 Left Metatarsal head first o P.O. Antibiotics - Doxycycine Electronic Signature(s) Signed: 05/20/2017 4:50:03 PM By: Linton Ham MD Signed: 05/20/2017 4:52:44 PM By: Gretta Cool, BSN, RN, CWS, Kim RN, BSN Entered By: Gretta Cool, BSN, RN, CWS, Kim on 05/20/2017 10:13:51 [place], [name] ([id number]) -------------------------------------------------------------------------------- Problem List Details Patient Name: [name] Date of Service: 05/20/2017 9:15 AM Medical Record Number: [id number] Patient Account Number: [id number] Date of Birth/Sex: 1940/11/15 (76 y.o. 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 breakdown 04/08/2017 Yes of skin L97.511 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 limited to 04/08/2017 Yes breakdown of  skin I25.5 Ischemic cardiomyopathy 04/08/2017 Yes Inactive Problems Resolved Problems Electronic Signature(s) Signed: 05/20/2017 4:50:03 PM By: Linton Ham MD Entered By: Linton Ham on 05/20/2017 16:39:04 [place], [name] ([id number]) -------------------------------------------------------------------------------- Progress Note Details Patient Name: [name] Date of Service: 05/20/2017 9:15 AM Medical Record Number: [id number] Patient Account Number: [id number] Date of Birth/Sex: [birth year] (76 y.o. M) Treating RN: Cornell Barman Primary Care Provider: Cyndi Bender Other Clinician: Referring Provider: Cyndi Bender Treating Provider/Extender: Tito Dine in Treatment: 6 Subjective History of Present Illness (HPI) 04/08/17; this is a complex 77 year old man referred here from [facility]. He had been referred there for bilateral lower extremity edema with ulcer formation predominantly on the right calf but also the right [name].  He had been receiving Unna boots bilaterally. The history here is long. He is not a diabetic however ICU looking through late 2018 he was worked up for chronic headaches, elevated inflammatory markers including C-reactive protein and ESR. He went on to actually have a left temporal artery biopsy wasn't that was negative.he received about 6 weeks of high-dose prednisone 60 mg with improvement in his inflammatory markers. He was admitted to hospital in late November with ventricular tachycardia syncope. He has known ischemic cardiomyopathy. He was admitted in the hospital in mid December. Apparently this was precipitated by a syncopal spell falling out of his scooter while at [place]. There was ventricular arrhythmia. He has an implantable defibrillator and echocardiogram showed severe LV dysfunction with an EF of 20% and valvular regurgitations including mild AR, moderate MR. There was no stenosis. He ruled in for a non-ST  elevation MI in the setting of V. tach.his wife states that sometime during this hospitalization she noted multiple areas of skin change on the right lower calf which became evident just after he left the hospital. He was back in hospital in February with acute renal failure hyponatremia. This responded to fluid resuscitation.. Interestingly I can't see much description of his right leg at that point in time.he was followed by Dr. Nehemiah Massed of dermatology for the necrotic wounds on his right leg. Apparently a biopsy was planned at one point but not done although in some notes that suggests it was. I cannot see these results area He was noted to have a lot of edema. Was treated with bilateral Unna boots edges really helped with the swelling they have been using Bactroban to small open areas predominantly on the right anterior lower leg His history is complicated by the fact that he has rheumatoid arthritis followed by rheumatology. He is followed by neurology for disabling headaches. At one point this was felt to be giant cell arteritis although a left temporal biopsy was apparently negative. He was given a prolonged course of prednisone at 60 mg which managed his sedimentation rates but apparently did not prove improve the headaches. This is been tapered to off on by rheumatology on 03/13/17 Vascular had plans to do a venous reflux workup as well as arterial studies in May. They also wanted to get him a lymphedema pump. As mentioned he's been using bacitracin under Unna boot wraps to both lower legs 04/15/17; the patient arrives with most of his wounds improved. These are small punched out wounds. Most of them remaining ones are on the right anterior calf with the most problematic over the right lateral malleolus. There are no new areas. The symptom complex or potential symptom complex we are dealing with his chronic disabling headaches with inflammatory markers not responsive to prednisone and with a  negative temporal biopsy, lower extremity weakness, skin ulcerations just on the right leg. We have managed to get his arterial studies moved up to April 30. He has a rheumatology consult at [facility] in June. He has seen dermatology locally, rheumatology locally, neurology locally. 04/22/17; small punched out areas on the right leg anteriorly posteriorly. Most of these appear to have closed over. Some of them have eschar over the surface. The most problematic area appears to be over the right lateral malleolus. We've been using prisma to all of this. He has arterial studies on April 30 and a rheumatology consult at [facility] on June 28 04/29/17; most of the small punched out areas on the right leg posteriorly are closed. He  has 2 or 3 openings anteriorly but most of these appear to be on the way to closing. Still problematically over the right lateral malleolus and right lateral [name] with almost ischemic-looking eschar. His arterial studies that I ordered are due to be done next week on the 30th so we should have them available for our next visit hopefully. He also saw a rheumatologist at [facility] and according to the patient he did 8 vials of blood. Finally he has scaling rash on his left [name] and what looks to be at [facility] area on the left anterior leg 05/06/17; most of the small punched out areas on the right leg posteriorly and anteriorly are closed. He still has one small one [name], Jermaine E. ([id number]) anteriorly one over the right lateral malleolus and one over the right lateral [name]. He had his arterial studies they didn't seem to do waveform analysis not exactly sure why however in any case is ABIs were noncompressible bilaterally. They did provide TBIs although looking at the pressures it appears that his TBIs are quite normal. I therefore went ahead and debrided the area over the right lateral malleolus and the right lateral [name] 05/13/17; most of the small punched out areas on the right leg  posteriorly and anteriorly are closed. He continues to have problematic areas over the right lateral malleolus and the right lateral [name]. He still requiring aggressive debridement of these 2 wounds using silver collagen I reviewed the note from rheumatology I don't think they came up with a specific diagnosis although he is known to have seropositive RA. They did a panel of lab work when I was able to see his his AMA was negative, anti-smooth muscle antibodies negative antineutrophil cytoplasmic antibodies negative,Liv/kia type 1 negative. Serum C3 and C4 were negative. I don't see his muscle enzymes specifically. He was referred back to his local rheumatologist for management of his known rheumatoid arthritis.the patient states he still feels weak and fatigued. He states he has numbness in both feet and apparently is known to have neuropathy 05/20/17; the patient continues to have a difficult problem on the right lateral malleolus and the right lateral [name]. Both of these wounds have no viable surface even with attempts at debridement. He has a new wound on the left plantar metatarsal head which looks more like a superficial diabetic pressure related injury then part of this underlying issue he has. Most of the rest of the wounds on his legs look satisfactory. Mostly on the right calf.reviewed his arterial studies which showed noncompressible vessels bilaterally but the TBIs were quite normal. Objective Constitutional Vitals Time Taken: 9:39 AM, Height: 68 in, Temperature: 97.6 F, Pulse: 66 bpm, Respiratory Rate: 16 breaths/min, Blood Pressure: 99/42 mmHg. Integumentary (Hair, Skin) Wound #4 status is Open. Original cause of wound was Gradually Appeared. The wound is located on the Right,Proximal,Lateral Lower Leg. The wound measures 0.1cm length x 0.1cm width x 0.1cm depth; 0.008cm^2 area and 0.001cm^3 volume. There is no tunneling or undermining noted. There is a none present amount of  drainage noted. The wound margin is flat and intact. There is no granulation within the wound bed. There is a large (67-100%) amount of necrotic tissue within the wound bed including Eschar. The periwound skin appearance did not exhibit: Callus, Crepitus, Excoriation, Induration, Rash, Scarring, Dry/Scaly, Maceration, Atrophie Blanche, Cyanosis, Ecchymosis, Hemosiderin Staining, Mottled, Pallor, Rubor, Erythema. Periwound temperature was noted as No Abnormality. Wound #6 status is Open. Original cause of wound was Gradually Appeared. The wound  is located on the Right,Lateral Malleolus. The wound measures 0.6cm length x 0.6cm width x 0.2cm depth; 0.283cm^2 area and 0.057cm^3 volume. There is no tunneling or undermining noted. There is a medium amount of serosanguineous drainage noted. The wound margin is distinct with the outline attached to the wound base. There is no granulation within the wound bed. There is a large (67-100%) amount of necrotic tissue within the wound bed including Eschar and Adherent Slough. The periwound skin appearance did not exhibit: Callus, Crepitus, Excoriation, Induration, Rash, Scarring, Dry/Scaly, Maceration, Atrophie Blanche, Cyanosis, Ecchymosis, Hemosiderin Staining, Mottled, Pallor, Rubor, Erythema. Periwound temperature was noted as No Abnormality. The periwound has tenderness on palpation. Wound #8 status is Open. Original cause of wound was Gradually Appeared. The wound is located on the Right,Lateral [name]. The wound measures 0.6cm length x 0.8cm width x 0.2cm depth; 0.377cm^2 area and 0.075cm^3 volume. There is no tunneling or undermining noted. There is a medium amount of serosanguineous drainage noted. The wound margin is flat and intact. There is no granulation within the wound bed. There is a large (67-100%) amount of necrotic tissue within the wound bed including Eschar and Adherent Slough. The periwound skin appearance did not exhibit: Callus, Crepitus,  Excoriation, Induration, Rash, Scarring, Dry/Scaly, Maceration, Atrophie Blanche, Cyanosis, Ecchymosis, Hemosiderin Staining, Mottled, Greenberger, Ami E. ([id number]) Pallor, Rubor, Erythema. Periwound temperature was noted as No Abnormality. Wound #9 status is Open. Original cause of wound was Gradually Appeared. The wound is located on the Left Metatarsal head first. The wound measures 0.5cm length x 0.5cm width x 0.1cm depth; 0.196cm^2 area and 0.02cm^3 volume. There is Fat Layer (Subcutaneous Tissue) Exposed exposed. There is no tunneling or undermining noted. There is a medium amount of serous drainage noted. The wound margin is flat and intact. There is medium (34-66%) pink granulation within the wound bed. There is a medium (34-66%) amount of necrotic tissue within the wound bed including Adherent Slough. The periwound skin appearance did not exhibit: Callus, Crepitus, Excoriation, Induration, Rash, Scarring, Dry/Scaly, Maceration, Atrophie Blanche, Cyanosis, Ecchymosis, Hemosiderin Staining, Mottled, Pallor, Rubor, Erythema. 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 There was a Excisional Skin/Subcutaneous Tissue Debridement with a total area of 0.36 sq cm performed by Ricard Dillon, MD. With the following instrument(s): Curette to remove Viable and Non-Viable tissue/material. Material removed includes Subcutaneous Tissue after achieving pain control using Other (lidocaine 4%). No specimens were taken. A time  out was conducted at 10:04, prior to the start of the procedure. A Minimum amount of bleeding was controlled with Pressure. The procedure was tolerated well with a pain level of 2 throughout and a pain level of 2 following the procedure. Patient s Level of Consciousness post procedure was recorded as Awake and Alert and post-procedure vitals were taken including Temperature: 97.6 F, Pulse: 66 bpm, Respiratory Rate: 16 breaths/min, Blood Pressure: (99)/(42) mmHg. Post Debridement Measurements: 0.6cm length x 0.6cm width x 0.4cm depth; 0.113cm^3 volume. Character of Wound/Ulcer Post Debridement is stable. Severity of Tissue Post Debridement is: Fat layer exposed. Post procedure Diagnosis Wound #6: Same as  Pre-Procedure Wound #8 Pre-procedure diagnosis of Wound #8 is an Arterial Insufficiency Ulcer located on the Right,Lateral [name] .Severity of Tissue Pre Debridement is: Muscle involvement without necrosis. There was a Excisional Skin/Subcutaneous Tissue/Muscle Debridement with a total area of 0.48 sq cm performed by Ricard Dillon, MD. With the following instrument(s): Curette to remove Viable and Non-Viable tissue/material. Material removed includes Muscle and Subcutaneous Tissue and after achieving pain control using Other (lidocaine 4%). No specimens were taken. A time out was conducted at 10:04, prior to the start of the procedure. A Minimum amount of bleeding was controlled with Pressure. The procedure was tolerated well with a pain level of 2 throughout and a pain level of 2 following the procedure. Patient s Level of Consciousness post procedure was recorded as Awake and Alert and post-procedure vitals were taken including Temperature: 97.6 F, Pulse: 66 bpm, Respiratory Rate: 16 breaths/min, Blood Pressure: (99)/(42) mmHg. Post Debridement Measurements: 0.6cm length x 0.8cm width x 0.4cm depth; 0.151cm^3 volume. Character of Wound/Ulcer Post Debridement is stable. Severity of Tissue Post  Debridement is: Muscle involvement without necrosis. Post procedure Diagnosis Wound #8: Same as Pre-Procedure [name], Jermaine E. ([id number]) Plan Wound Cleansing: Wound #4 Right,Proximal,Lateral Lower Leg: Clean wound with Normal Saline. Wound #6 Right,Lateral Malleolus: Clean wound with Normal Saline. Wound #8 Right,Lateral [name]: Clean wound with Normal Saline. Wound #9 Left Metatarsal head first: Clean wound with Normal Saline. Anesthetic (add to Medication List): Wound #4 Right,Proximal,Lateral Lower Leg: Topical Lidocaine 4% cream applied to wound bed prior to debridement (In Clinic Only). Wound #6 Right,Lateral Malleolus: Topical Lidocaine 4% cream applied to wound bed prior to debridement (In Clinic Only). Wound #8 Right,Lateral [name]: Topical Lidocaine 4% cream applied to wound bed prior to debridement (In Clinic Only). Wound #9 Left Metatarsal head first: Topical Lidocaine 4% cream applied to wound bed prior to debridement (In Clinic Only). Skin Barriers/Peri-Wound Care: Wound #4 Right,Proximal,Lateral Lower Leg: Triamcinolone Acetonide Ointment (TAC) Wound #6 Right,Lateral Malleolus: Triamcinolone Acetonide Ointment (TAC) Wound #8 Right,Lateral [name]: Triamcinolone Acetonide Ointment (TAC) Primary Wound Dressing: Wound #4 Right,Proximal,Lateral Lower Leg: Silver Collagen - all wounds Wound #6 Right,Lateral Malleolus: Silver Collagen - all wounds Wound #8 Right,Lateral [name]: Silver Collagen - all wounds Wound #9 Left Metatarsal head first: Silver Collagen - all wounds Secondary Dressing: Wound #4 Right,Proximal,Lateral Lower Leg: ABD pad - over collegen, Unna paste to Anchor at top Wound #6 Right,Lateral Malleolus: ABD pad - over collegen, Unna paste to Anchor at top Wound #8 Right,Lateral [name]: ABD pad - over collegen, Unna paste to Anchor at top Wound #9 Left Metatarsal head first: ABD and Kerlix/Conform Dressing Change Frequency: Wound #4  Right,Proximal,Lateral Lower Leg: Change Dressing Monday, Wednesday, Friday Wound #6 Right,Lateral Malleolus: Change Dressing Monday, Wednesday, Friday Wound #8 Right,Lateral [name]: Change Dressing Monday, Wednesday, Friday Wound #9 Left Metatarsal head first: JAKOBIE, HENSLEE ([id number]) Change Dressing Monday, Wednesday, Friday Follow-up Appointments: Wound #4 Right,Proximal,Lateral Lower Leg: Return Appointment in 1 week. Wound #6 Right,Lateral Malleolus: Return Appointment in 1 week. Wound #8 Right,Lateral [name]: Return Appointment in 1 week. Wound #9 Left Metatarsal head first: Return Appointment in 1 week. Edema Control: Wound #4 Right,Proximal,Lateral Lower Leg: 3 Layer Compression System - Right Lower Extremity Wound #6 Right,Lateral Malleolus: 3 Layer Compression System - Right Lower Extremity Wound #8 Right,Lateral [name]: 3 Layer Compression System - Right Lower Extremity Home Health: Wound #4 Right,Proximal,Lateral Lower Leg: [place] Nurse may visit PRN to address patient s wound care needs.  FACE TO FACE ENCOUNTER: MEDICARE and MEDICAID PATIENTS: I certify that this patient is under my care and that I had a face-to-face encounter that meets the physician face-to-face encounter requirements with this patient on this date. The encounter with the patient was in whole or in part for the following MEDICAL CONDITION: (primary reason for [place]) MEDICAL NECESSITY: I certify, that based on my findings, NURSING services are a medically necessary home health service. HOME BOUND STATUS: I certify that my clinical findings support that this patient is homebound (i.e., Due to illness or injury, pt requires aid of supportive devices such as crutches, cane, wheelchairs, walkers, the use of special transportation or the assistance of another person to leave their place of residence. There is a normal inability to leave the home and doing so  requires considerable and taxing effort. Other absences are for medical reasons / religious services and are infrequent or of short duration when for other reasons). If current dressing causes regression in wound condition, may D/C ordered dressing product/s and apply Normal Saline Moist Dressing daily until next [place] / Other MD appointment. [place] of regression in wound condition at [phone]. Please direct any NON-WOUND related issues/requests for orders to patient's Primary Care Physician Wound #6 Right,Lateral Malleolus: [place] Nurse may visit PRN to address patient s wound care needs. FACE TO FACE ENCOUNTER: MEDICARE and MEDICAID PATIENTS: I certify that this patient is under my care and that I had a face-to-face encounter that meets the physician face-to-face encounter requirements with this patient on this date. The encounter with the patient was in whole or in part for the following MEDICAL CONDITION: (primary reason for [place]) MEDICAL NECESSITY: I certify, that based on my findings, NURSING services are a medically necessary home health service. HOME BOUND STATUS: I certify that my clinical findings support that this patient is homebound (i.e., Due to illness or injury, pt requires aid of supportive devices such as crutches, cane, wheelchairs, walkers, the use of special transportation or the assistance of another person to leave their place of residence. There is a normal inability to leave the home and doing so requires considerable and taxing effort. Other absences are for medical reasons / religious services and are infrequent or of short duration when for other reasons). If current dressing causes regression in wound condition, may D/C ordered dressing product/s and apply Normal Saline Moist Dressing daily until next [place] / Other MD appointment. [place] of regression  in wound condition at [phone]. Please direct any NON-WOUND related issues/requests for orders to patient's Primary Care Physician Wound #8 Right,Lateral [name]: [place] Nurse may visit PRN to address patient s wound care needs. FACE TO FACE ENCOUNTER: MEDICARE and MEDICAID PATIENTS: I certify that this patient is under my care and that I had a face-to-face encounter that meets the physician face-to-face encounter requirements with this patient on this date. The encounter with the patient was in whole or in part for the following MEDICAL CONDITION: (primary reason for [place]) MEDICAL NECESSITY: I certify, that based on my findings, NURSING services are a medically necessary home health service. HOME BOUND STATUS: I certify that my clinical findings support that this patient is homebound (i.e., Due to illness or injury, pt requires aid of supportive devices such as crutches, cane, wheelchairs, walkers, the use of special transportation or the assistance of another person to leave their place  of residence. There is a normal inability to leave the [place], [name]. ([id number]) home and doing so requires considerable and taxing effort. Other absences are for medical reasons / religious services and are infrequent or of short duration when for other reasons). If current dressing causes regression in wound condition, may D/C ordered dressing product/s and apply Normal Saline Moist Dressing daily until next [place] / Other MD appointment. [place] of regression in wound condition at [phone]. Please direct any NON-WOUND related issues/requests for orders to patient's Primary Care Physician Wound #9 Left Metatarsal head first: [place] Nurse may visit PRN to address patient s wound care needs. FACE TO FACE ENCOUNTER: MEDICARE and MEDICAID PATIENTS: I certify that this patient is under my care  and that I had a face-to-face encounter that meets the physician face-to-face encounter requirements with this patient on this date. The encounter with the patient was in whole or in part for the following MEDICAL CONDITION: (primary reason for [place]) MEDICAL NECESSITY: I certify, that based on my findings, NURSING services are a medically necessary home health service. HOME BOUND STATUS: I certify that my clinical findings support that this patient is homebound (i.e., Due to illness or injury, pt requires aid of supportive devices such as crutches, cane, wheelchairs, walkers, the use of special transportation or the assistance of another person to leave their place of residence. There is a normal inability to leave the home and doing so requires considerable and taxing effort. Other absences are for medical reasons / religious services and are infrequent or of short duration when for other reasons). If current dressing causes regression in wound condition, may D/C ordered dressing product/s and apply Normal Saline Moist Dressing daily until next [place] / Other MD appointment. [place] of regression in wound condition at [phone]. Please direct any NON-WOUND related issues/requests for orders to patient's Primary Care Physician Medications-please add to medication list.: Wound #4 Right,Proximal,Lateral Lower Leg: P.O. Antibiotics - Doxycycine Wound #6 Right,Lateral Malleolus: P.O. Antibiotics - Doxycycine Wound #8 Right,Lateral [name]: P.O. Antibiotics - Doxycycine Wound #9 Left Metatarsal head first: P.O. Antibiotics - Doxycycine 1 I continue to use [employer] and all open wounds including the new one on the left plantar metatarsal head. #2 the left plantar metatarsal head wound is probably a diabetic [name] ulcer related to pressure rather than part of his underlying condition # 3I May have vascular surgery have a look at the right leg blood  flow #4 we are certainly not making progress with the area on the right lateral malleolus and right lateral [name] in fact they look worse without a viable surface #5 the cause of his multiple skin lesions on the right leg looked vasculitic or vasculopathy. This was associated with multiple other rheumatologic-like symptoms Electronic Signature(s) Signed: 05/20/2017 4:50:03 PM By: Linton Ham MD Entered By: Linton Ham on 05/20/2017 16:46:07 [name], [name] ([id number]) -------------------------------------------------------------------------------- Ada Details Patient Name: [name] Date of Service: 05/20/2017 Medical Record Number: [id number] Patient Account Number: [id number] Date of Birth/Sex: [birth year] (76 y.o. 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 L97.211 Non-pressure chronic ulcer of right calf limited to breakdown of skin Facility Procedures CPT4 Code Description: [id number] 11042 - DEB SUBQ TISSUE 20 SQ CM/< ICD-10 Diagnosis Description D14.970 Non-pressure chronic ulcer of right ankle limited to breakdown o Modifier: 59 f skin Quantity: 1 CPT4 Code Description: [id number] 11043 - DEB MUSC/FASCIA 20 SQ CM/< ICD-10 Diagnosis Description L97.513 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 with necr Modifier: osis of muscle Quantity: 1 Physician Procedures CPT4 Code Description: [id number] 11042 - WC PHYS SUBQ TISS 20 SQ CM ICD-10 Diagnosis Description O87.867 Non-pressure  chronic ulcer of right ankle limited to breakdown of Modifier: 59 skin Quantity: 1 CPT4 Code Description: [id number] - WC PHYS DEBR MUSCLE/FASCIA 20 SQ CM ICD-10 Diagnosis Description L97.513 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 with necro Modifier: sis of muscle Quantity: 1 Electronic Signature(s) Signed: 05/20/2017 4:50:03 PM By: Linton Ham MD Entered By: Linton Ham on 05/20/2017 16:48:39

## 2017-05-25 DIAGNOSIS — I110 Hypertensive heart disease with heart failure: Secondary | ICD-10-CM | POA: Diagnosis not present

## 2017-05-25 DIAGNOSIS — I255 Ischemic cardiomyopathy: Secondary | ICD-10-CM | POA: Diagnosis not present

## 2017-05-25 DIAGNOSIS — I2510 Atherosclerotic heart disease of native coronary artery without angina pectoris: Secondary | ICD-10-CM | POA: Diagnosis not present

## 2017-05-25 DIAGNOSIS — S81811D Laceration without foreign body, right lower leg, subsequent encounter: Secondary | ICD-10-CM | POA: Diagnosis not present

## 2017-05-25 DIAGNOSIS — I5023 Acute on chronic systolic (congestive) heart failure: Secondary | ICD-10-CM | POA: Diagnosis not present

## 2017-05-25 DIAGNOSIS — M069 Rheumatoid arthritis, unspecified: Secondary | ICD-10-CM | POA: Diagnosis not present

## 2017-05-26 DIAGNOSIS — I5022 Chronic systolic (congestive) heart failure: Secondary | ICD-10-CM | POA: Diagnosis not present

## 2017-05-26 DIAGNOSIS — M059 Rheumatoid arthritis with rheumatoid factor, unspecified: Secondary | ICD-10-CM | POA: Diagnosis not present

## 2017-05-26 DIAGNOSIS — R700 Elevated erythrocyte sedimentation rate: Secondary | ICD-10-CM | POA: Diagnosis not present

## 2017-05-27 ENCOUNTER — Other Ambulatory Visit (HOSPITAL_BASED_OUTPATIENT_CLINIC_OR_DEPARTMENT_OTHER): Payer: Self-pay | Admitting: Internal Medicine

## 2017-05-27 ENCOUNTER — Ambulatory Visit
Admission: RE | Admit: 2017-05-27 | Discharge: 2017-05-27 | Disposition: A | Discharge status: Home / Self Care | Payer: Medicare Other | Source: Ambulatory Visit | Attending: Internal Medicine | Admitting: Internal Medicine

## 2017-05-27 ENCOUNTER — Encounter: Payer: Medicare Other | Admitting: Internal Medicine

## 2017-05-27 DIAGNOSIS — B999 Unspecified infectious disease: Secondary | ICD-10-CM | POA: Diagnosis not present

## 2017-05-27 DIAGNOSIS — S91302A Unspecified open wound, left foot, initial encounter: Secondary | ICD-10-CM | POA: Diagnosis not present

## 2017-05-27 DIAGNOSIS — L97319 Non-pressure chronic ulcer of right ankle with unspecified severity: Secondary | ICD-10-CM | POA: Diagnosis not present

## 2017-05-27 DIAGNOSIS — L97211 Non-pressure chronic ulcer of right calf limited to breakdown of skin: Secondary | ICD-10-CM | POA: Diagnosis not present

## 2017-05-27 DIAGNOSIS — S91001A Unspecified open wound, right ankle, initial encounter: Secondary | ICD-10-CM | POA: Diagnosis not present

## 2017-05-27 DIAGNOSIS — S91301A Unspecified open wound, right foot, initial encounter: Secondary | ICD-10-CM | POA: Diagnosis not present

## 2017-05-27 DIAGNOSIS — L97819 Non-pressure chronic ulcer of other part of right lower leg with unspecified severity: Secondary | ICD-10-CM | POA: Diagnosis not present

## 2017-05-27 DIAGNOSIS — L97512 Non-pressure chronic ulcer of other part of right foot with fat layer exposed: Secondary | ICD-10-CM | POA: Diagnosis not present

## 2017-05-27 DIAGNOSIS — L97521 Non-pressure chronic ulcer of other part of left foot limited to breakdown of skin: Secondary | ICD-10-CM | POA: Diagnosis not present

## 2017-05-27 DIAGNOSIS — L97519 Non-pressure chronic ulcer of other part of right foot with unspecified severity: Secondary | ICD-10-CM | POA: Diagnosis not present

## 2017-05-27 DIAGNOSIS — I255 Ischemic cardiomyopathy: Secondary | ICD-10-CM | POA: Diagnosis not present

## 2017-05-27 DIAGNOSIS — I472 Ventricular tachycardia: Secondary | ICD-10-CM | POA: Diagnosis not present

## 2017-05-28 DIAGNOSIS — I2510 Atherosclerotic heart disease of native coronary artery without angina pectoris: Secondary | ICD-10-CM | POA: Diagnosis not present

## 2017-05-28 DIAGNOSIS — I5022 Chronic systolic (congestive) heart failure: Secondary | ICD-10-CM | POA: Diagnosis not present

## 2017-05-28 DIAGNOSIS — I214 Non-ST elevation (NSTEMI) myocardial infarction: Secondary | ICD-10-CM | POA: Diagnosis not present

## 2017-05-28 DIAGNOSIS — I10 Essential (primary) hypertension: Secondary | ICD-10-CM | POA: Diagnosis not present

## 2017-05-29 DIAGNOSIS — M069 Rheumatoid arthritis, unspecified: Secondary | ICD-10-CM | POA: Diagnosis not present

## 2017-05-29 DIAGNOSIS — I2510 Atherosclerotic heart disease of native coronary artery without angina pectoris: Secondary | ICD-10-CM | POA: Diagnosis not present

## 2017-05-29 DIAGNOSIS — S81811D Laceration without foreign body, right lower leg, subsequent encounter: Secondary | ICD-10-CM | POA: Diagnosis not present

## 2017-05-29 DIAGNOSIS — I110 Hypertensive heart disease with heart failure: Secondary | ICD-10-CM | POA: Diagnosis not present

## 2017-05-29 DIAGNOSIS — I5023 Acute on chronic systolic (congestive) heart failure: Secondary | ICD-10-CM | POA: Diagnosis not present

## 2017-05-29 DIAGNOSIS — I255 Ischemic cardiomyopathy: Secondary | ICD-10-CM | POA: Diagnosis not present

## 2017-05-29 NOTE — Progress Notes (Signed)
DMONTE, MAHER ([id number]) Visit Report for 05/27/2017 HPI Details Patient Name: [name], [name] Date of Service: 05/27/2017 8:45 AM Medical Record Number: [id number] Patient Account Number: [id number] Date of Birth/Sex: 1940-05-09 (77 y.o. M) Treating RN: Cornell Barman Primary Care Provider: Cyndi Bender Other Clinician: Referring Provider: Cyndi Bender Treating Provider/Extender: Tito Dine in Treatment: 7 History of Present Illness HPI Description: 04/08/17; this is a complex 77 year old man referred here from [facility]. He had been referred there for bilateral lower extremity edema with ulcer formation predominantly on the right calf but also the right [name]. He had been receiving Unna boots bilaterally. The history here is long. He is not a diabetic however ICU looking through late 2018 he was worked up for chronic headaches, elevated inflammatory markers including C-reactive protein and ESR. He went on to actually have a left temporal artery biopsy wasn't that was negative.he received about 6 weeks of high-dose prednisone 60 mg with improvement in his inflammatory markers. He was admitted to hospital in late November with ventricular tachycardia syncope. He has known ischemic cardiomyopathy. He was admitted in the hospital in mid December. Apparently this was precipitated by a syncopal spell falling out of his scooter while at [place]. There was ventricular arrhythmia. He has an implantable defibrillator and echocardiogram showed severe LV dysfunction with an EF of 20% and valvular regurgitations including mild AR, moderate MR. There was no stenosis. He ruled in for a non-ST elevation MI in the setting of V. tach.his wife states that sometime during this hospitalization she noted multiple areas of skin change on the right lower calf which became evident just after he left the hospital. He was back in hospital in February with acute renal failure  hyponatremia. This responded to fluid resuscitation.. Interestingly I can't see much description of his right leg at that point in time.he was followed by Dr. Nehemiah Massed of dermatology for the necrotic wounds on his right leg. Apparently a biopsy was planned at one point but not done although in some notes that suggests it was. I cannot see these results area He was noted to have a lot of edema. Was treated with bilateral Unna boots edges really helped with the swelling they have been using Bactroban to small open areas predominantly on the right anterior lower leg His history is complicated by the fact that he has rheumatoid arthritis followed by rheumatology. He is followed by neurology for disabling headaches. At one point this was felt to be giant cell arteritis although a left temporal biopsy was apparently negative. He was given a prolonged course of prednisone at 60 mg which managed his sedimentation rates but apparently did not prove improve the headaches. This is been tapered to off on by rheumatology on 03/13/17 Vascular had plans to do a venous reflux workup as well as arterial studies in May. They also wanted to get him a lymphedema pump. As mentioned he's been using bacitracin under Unna boot wraps to both lower legs 04/15/17; the patient arrives with most of his wounds improved. These are small punched out wounds. Most of them remaining ones are on the right anterior calf with the most problematic over the right lateral malleolus. There are no new areas. The symptom complex or potential symptom complex we are dealing with his chronic disabling headaches with inflammatory markers not responsive to prednisone and with a negative temporal biopsy, lower extremity weakness, skin ulcerations just on the right leg. We have managed to get his arterial studies  moved up to April 30. He has a rheumatology consult at [facility] in June. He has seen dermatology locally, rheumatology locally, neurology  locally. 04/22/17; small punched out areas on the right leg anteriorly posteriorly. Most of these appear to have closed over. Some of them have eschar over the surface. The most problematic area appears to be over the right lateral malleolus. We've been using prisma to all of this. He has arterial studies on April 30 and a rheumatology consult at [facility] on June 28 04/29/17; most of the small punched out areas on the right leg posteriorly are closed. He has 2 or 3 openings anteriorly but most of these appear to be on the way to closing. Still problematically over the right lateral malleolus and right lateral [name] with almost ischemic-looking eschar. His arterial studies that I ordered are due to be done next week on the 30th so we should have them available for our next visit hopefully. He also saw a rheumatologist at [facility] and according to the patient he did 8 Garay, Aceyn E. ([id number]) vials of blood. Finally he has scaling rash on his left [name] and what looks to be at [facility] area on the left anterior leg 05/06/17; most of the small punched out areas on the right leg posteriorly and anteriorly are closed. He still has one small one anteriorly one over the right lateral malleolus and one over the right lateral [name]. He had his arterial studies they didn't seem to do waveform analysis not exactly sure why however in any case is ABIs were noncompressible bilaterally. They did provide TBIs although looking at the pressures it appears that his TBIs are quite normal. I therefore went ahead and debrided the area over the right lateral malleolus and the right lateral [name] 05/13/17; most of the small punched out areas on the right leg posteriorly and anteriorly are closed. He continues to have problematic areas over the right lateral malleolus and the right lateral [name]. He still requiring aggressive debridement of these 2 wounds using silver collagen I reviewed the note from rheumatology I don't think they  came up with a specific diagnosis although he is known to have seropositive RA. They did a panel of lab work when I was able to see his his AMA was negative, anti-smooth muscle antibodies negative antineutrophil cytoplasmic antibodies negative,Liv/kia type 1 negative. Serum C3 and C4 were negative. I don't see his muscle enzymes specifically. He was referred back to his local rheumatologist for management of his known rheumatoid arthritis.the patient states he still feels weak and fatigued. He states he has numbness in both feet and apparently is known to have neuropathy 05/20/17; the patient continues to have a difficult problem on the right lateral malleolus and the right lateral [name]. Both of these wounds have no viable surface even with attempts at debridement. He has a new wound on the left plantar metatarsal head which looks more like a superficial diabetic pressure related injury then part of this underlying issue he has. Most of the rest of the wounds on his legs look satisfactory. Mostly on the right calf.reviewed his arterial studies which showed noncompressible vessels bilaterally but the TBIs were quite normal. 05/27/17; no real improvement in the right lateral malleolus and right lateral [name]. In fact the right lateral [name] is now on bone. I gave him doxycycline empirically last week it appears that he developed photosensitivity was 55 the son in a tractor. I'll not give him any more of this. This is predominantly  on his face and dorsal forearms and hands. I given this more as an anti- inflammatory. I'm going to have him seen by vascular surgery. His TBIs that he had done previously ordered by Dr. Brigitte Pulse were in the normal range They never did full arterial studies on him. he now has small punched out wounds on the right lateral ankle and right lateral [name]. I doubt these are ischemic however I would like a review of his macrovascular status. He does describe some pain at night. I'll  reduce the compression from 3 layer to 2 layer and I'm not convinced that this is a macrovascular issue however I want to make sure. We've been using silver alginate Electronic Signature(s) Signed: 05/27/2017 5:10:30 PM By: Linton Ham MD Entered By: Linton Ham on 05/27/2017 10:44:40 Kavanagh, [name] ([id number]) -------------------------------------------------------------------------------- Physical Exam Details Patient Name: [name] Date of Service: 05/27/2017 8:45 AM Medical Record Number: [id number] Patient Account Number: [id number] Date of Birth/Sex: 1940-03-14 (77 y.o. M) Treating RN: Cornell Barman Primary Care Provider: Cyndi Bender Other Clinician: Referring Provider: Cyndi Bender Treating Provider/Extender: Tito Dine in Treatment: 7 Constitutional Patient is hypotensive. however he appears well and his blood pressure is usually in this range. Pulse regular and within target range for patient.[name] Respirations regular, non-labored and within target range.. Temperature is normal and within the target range for the patient.[name] appears in no distress. Eyes Conjunctivae clear. No discharge. Cardiovascular he has weak dorsalis pedis pulses.. Lymphatic none palpable in the popliteal or inguinal area. Integumentary (Hair, Skin) he has what appears to be a photosensitivity rash from doxycycline. He was apparently in a tractor out in the sun. This is on his face and dorsal hands and forearms. He only has one more of these left. Notes wound exam; multiple areas on the right posterior right anterior calf are healed. He continues to have problematic areas over the right lateral malleolus and right lateral [name] which are deep punched out areas. No debridement today. The lateral [name] wound is right on bone. No evidence of infection -The area on the plantar aspect of the left first metatarsal head is superficial and looks like it's about to close. Electronic  Signature(s) Signed: 05/27/2017 5:10:30 PM By: Linton Ham MD Entered By: Linton Ham on 05/27/2017 10:51:53 Redder, [name] ([id number]) -------------------------------------------------------------------------------- Physician Orders Details Patient Name: [name] Date of Service: 05/27/2017 8:45 AM Medical Record Number: [id number] Patient Account Number: [id number] Date of Birth/Sex: December 31, 1940 (77 y.o. M) Treating RN: Cornell Barman Primary Care Provider: Cyndi Bender Other Clinician: Referring Provider: Cyndi Bender Treating Provider/Extender: Tito Dine in Treatment: 7 Verbal / Phone Orders: No Diagnosis Coding Wound Cleansing Wound #4 Right,Proximal,Lateral Lower Leg o Clean wound with Normal Saline. Wound #6 Right,Lateral Malleolus o Clean wound with Normal Saline. Wound #8 Right,Lateral [name] o Clean wound with Normal Saline. Wound #9 Left Metatarsal head first o Clean wound with Normal Saline. Anesthetic (add to Medication List) Wound #4 Right,Proximal,Lateral Lower Leg o Topical Lidocaine 4% cream applied to wound bed prior to debridement (In Clinic Only). Wound #6 Right,Lateral Malleolus o Topical Lidocaine 4% cream applied to wound bed prior to debridement (In Clinic Only). Wound #8 Right,Lateral [name] o Topical Lidocaine 4% cream applied to wound bed prior to debridement (In Clinic Only). Wound #9 Left Metatarsal head first o Topical Lidocaine 4% cream applied to wound bed prior to debridement (In Clinic Only). Skin Barriers/Peri-Wound Care Wound #4 Right,Proximal,Lateral Lower Leg o Triamcinolone Acetonide Ointment (TAC)  Wound #6 Right,Lateral Malleolus o Triamcinolone Acetonide Ointment (TAC) Wound #8 Right,Lateral [name] o Triamcinolone Acetonide Ointment (TAC) Wound #9 Left Metatarsal head first o Triamcinolone Acetonide Ointment (TAC) Primary Wound Dressing Wound #4 Right,Proximal,Lateral Lower Leg o  Silver Collagen - all wounds Wound #6 Right,Lateral Malleolus Sutphin, WILEY MAGAN ([id number]) o Silver Collagen - all wounds Wound #8 Right,Lateral [name] o Silver Collagen - all wounds Wound #9 Left Metatarsal head first o Silver Collagen - all wounds Secondary Dressing Wound #4 Right,Proximal,Lateral Lower Leg o ABD pad - over collegen, Unna paste to Anchor at top Wound #6 Right,Lateral Malleolus o ABD pad - over collegen, Unna paste to Anchor at top Wound #8 Right,Lateral [name] o ABD pad - over collegen, Unna paste to Anchor at top Wound #9 Left Metatarsal head first o Boardered Foam Dressing Dressing Change Frequency Wound #4 Right,Proximal,Lateral Lower Leg o Change Dressing Monday, Wednesday, Friday Wound #6 Right,Lateral Malleolus o Change Dressing Monday, Wednesday, Friday Wound #8 Right,Lateral [name] o Change Dressing Monday, Wednesday, Friday Wound #9 Left Metatarsal head first o Change Dressing Monday, Wednesday, Friday Follow-up Appointments Wound #4 Right,Proximal,Lateral Lower Leg o Return Appointment in 1 week. Wound #6 Right,Lateral Malleolus o Return Appointment in 1 week. Wound #8 Right,Lateral [name] o Return Appointment in 1 week. Wound #9 Left Metatarsal head first o Return Appointment in 1 week. Edema Control Wound #4 Right,Proximal,Lateral Lower Leg o Kerlix and Coban - Right Lower Extremity Wound #6 Right,Lateral Malleolus o Kerlix and Coban - Right Lower Extremity Wound #8 Right,Lateral [name] o Kerlix and Coban - Right Lower Extremity ABDIFATAH, [place]N ([id number]) Home Health Wound #4 Right,Proximal,Lateral Lower Leg o [place] Nurse may visit PRN to address patientos wound care needs. o FACE TO FACE ENCOUNTER: MEDICARE and MEDICAID PATIENTS: I certify that this patient is under my care and that I had a face-to-face encounter that meets the physician face-to-face encounter  requirements with this patient on this date. The encounter with the patient was in whole or in part for the following MEDICAL CONDITION: (primary reason for [place]) MEDICAL NECESSITY: I certify, that based on my findings, NURSING services are a medically necessary home health service. HOME BOUND STATUS: I certify that my clinical findings support that this patient is homebound (i.e., Due to illness or injury, pt requires aid of supportive devices such as crutches, cane, wheelchairs, walkers, the use of special transportation or the assistance of another person to leave their place of residence. There is a normal inability to leave the home and doing so requires considerable and taxing effort. Other absences are for medical reasons / religious services and are infrequent or of short duration when for other reasons). o If current dressing causes regression in wound condition, may D/C ordered dressing product/s and apply Normal Saline Moist Dressing daily until next [place] / Other MD appointment. [place] of regression in wound condition at [phone]. o Please direct any NON-WOUND related issues/requests for orders to patient's Primary Care Physician Wound #6 [place] Nurse may visit PRN to address patientos wound care needs. o FACE TO FACE ENCOUNTER: MEDICARE and MEDICAID PATIENTS: I certify that this patient is under my care and that I had a face-to-face encounter that meets the physician face-to-face encounter requirements with this patient on this date. The encounter with the patient was in whole or in part for the following MEDICAL CONDITION: (primary reason for Home  Healthcare) MEDICAL NECESSITY: I certify, that based on my findings, NURSING services are a medically necessary home health service. HOME BOUND STATUS: I certify that my clinical findings support that this patient is  homebound (i.e., Due to illness or injury, pt requires aid of supportive devices such as crutches, cane, wheelchairs, walkers, the use of special transportation or the assistance of another person to leave their place of residence. There is a normal inability to leave the home and doing so requires considerable and taxing effort. Other absences are for medical reasons / religious services and are infrequent or of short duration when for other reasons). o If current dressing causes regression in wound condition, may D/C ordered dressing product/s and apply Normal Saline Moist Dressing daily until next [place] / Other MD appointment. [place] of regression in wound condition at [phone]. o Please direct any NON-WOUND related issues/requests for orders to patient's Primary Care Physician Wound #8 [place] Nurse may visit PRN to address patientos wound care needs. o FACE TO FACE ENCOUNTER: MEDICARE and MEDICAID PATIENTS: I certify that this patient is under my care and that I had a face-to-face encounter that meets the physician face-to-face encounter requirements with this patient on this date. The encounter with the patient was in whole or in part for the following MEDICAL CONDITION: (primary reason for [place]) MEDICAL NECESSITY: I certify, that based on my findings, NURSING services are a medically necessary home health service. HOME BOUND STATUS: I certify that my clinical findings support that this patient is homebound (i.e., Due to illness or injury, pt requires aid of supportive devices such as crutches, cane, wheelchairs, walkers, the use of special transportation or the assistance of another person to leave their place of residence. There is a normal inability to leave the home and doing so requires considerable and taxing effort. Other absences are for medical reasons /  religious services and are infrequent or of short duration when for other reasons). o If current dressing causes regression in wound condition, may D/C ordered dressing product/s and apply Normal Saline Moist Dressing daily until next [place] / Other MD appointment. [place] of regression in wound condition at [phone]. o Please direct any NON-WOUND related issues/requests for orders to patient's Primary Care Physician Wound #9 Left Metatarsal head first o [place] Visits LAZLO, TUNNEY ([id number]) o [place] Nurse may visit PRN to address patientos wound care needs. o FACE TO FACE ENCOUNTER: MEDICARE and MEDICAID PATIENTS: I certify that this patient is under my care and that I had a face-to-face encounter that meets the physician face-to-face encounter requirements with this patient on this date. The encounter with the patient was in whole or in part for the following MEDICAL CONDITION: (primary reason for [place]) MEDICAL NECESSITY: I certify, that based on my findings, NURSING services are a medically necessary home health service. HOME BOUND STATUS: I certify that my clinical findings support that this patient is homebound (i.e., Due to illness or injury, pt requires aid of supportive devices such as crutches, cane, wheelchairs, walkers, the use of special transportation or the assistance of another person to leave their place of residence. There is a normal inability to leave the home and doing so requires considerable and taxing effort. Other absences are for medical reasons / religious services and are infrequent or of short duration when for other reasons). o If current dressing  causes regression in wound condition, may D/C ordered dressing product/s and apply Normal Saline Moist Dressing daily until next [place] / Other MD appointment. [place] of regression in wound condition at  [phone]. o Please direct any NON-WOUND related issues/requests for orders to patient's Primary Care Physician Medications-please add to medication list. Wound #4 Right,Proximal,Lateral Lower Leg o P.O. Antibiotics - Complete Doxycycine - Stay out of the sun! Wound #6 Right,Lateral Malleolus o P.O. Antibiotics - Complete Doxycycine - Stay out of the sun! Wound #8 Right,Lateral [name] o P.O. Antibiotics - Complete Doxycycine - Stay out of the sun! Wound #9 Left Metatarsal head first o P.O. Antibiotics - Complete Doxycycine - Stay out of the sun! Consults o Vascular - MD [facility] Radiology o X-ray, ankle - Right [name] and Ankle Electronic Signature(s) Signed: 05/27/2017 5:10:30 PM By: Linton Ham MD Signed: 05/27/2017 5:19:09 PM By: Gretta Cool, BSN, RN, CWS, Kim RN, BSN Entered By: Gretta Cool, BSN, RN, CWS, Kim on 05/27/2017 10:53:41 [place]aker, [name] ([id number]) -------------------------------------------------------------------------------- Problem List Details Patient Name: [name] Date of Service: 05/27/2017 8:45 AM Medical Record Number: [id number] Patient Account Number: [id number] Date of Birth/Sex: Sep 05, 1940 (77 y.o. 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 04/08/2017 Yes breakdown of skin L97.211 Non-pressure chronic ulcer of right calf limited to breakdown 04/08/2017 Yes of skin L97.511 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 limited to 04/08/2017 Yes breakdown of skin I25.5 Ischemic cardiomyopathy 04/08/2017 Yes Inactive Problems Resolved Problems Electronic Signature(s) Signed: 05/27/2017 5:10:30 PM By: Linton Ham MD Entered  By: Linton Ham on 05/27/2017 10:40:59 Loll, [name] ([id number]) -------------------------------------------------------------------------------- Progress Note Details Patient Name: [name] Date of Service: 05/27/2017 8:45 AM Medical Record Number: [id number] Patient Account Number: [id number] Date of Birth/Sex: November 12, 1940 (77 y.o. M) Treating RN: Cornell Barman Primary Care Provider: Cyndi Bender Other Clinician: Referring Provider: Cyndi Bender Treating Provider/Extender: Tito Dine in Treatment: 7 Subjective History of Present Illness (HPI) 04/08/17; this is a complex 77 year old man referred here from [facility]. He had been referred there for bilateral lower extremity edema with ulcer formation predominantly on the right calf but also the right [name]. He had been receiving Unna boots bilaterally. The history here is long. He is not a diabetic however ICU looking through late 2018 he was worked up for chronic headaches, elevated inflammatory markers including C-reactive protein and ESR. He went on to actually have a left temporal artery biopsy wasn't that was negative.he received about 6 weeks of high-dose prednisone 60 mg with improvement in his inflammatory markers. He was admitted to hospital in late November with ventricular tachycardia syncope. He has known ischemic cardiomyopathy. He was admitted in the hospital in mid December. Apparently this was precipitated by a syncopal spell falling out of his scooter while at [place]. There was ventricular arrhythmia. He has an implantable defibrillator and echocardiogram showed severe LV dysfunction with an EF of 20% and valvular regurgitations including mild AR, moderate MR. There was no stenosis. He ruled in for a non-ST elevation MI in the setting of V. tach.his wife states that sometime during this hospitalization she noted multiple areas of skin change on the right lower calf which became  evident just after he left  the hospital. He was back in hospital in February with acute renal failure hyponatremia. This responded to fluid resuscitation.. Interestingly I can't see much description of his right leg at that point in time.he was followed by Dr. Nehemiah Massed of dermatology for the necrotic wounds on his right leg. Apparently a biopsy was planned at one point but not done although in some notes that suggests it was. I cannot see these results area He was noted to have a lot of edema. Was treated with bilateral Unna boots edges really helped with the swelling they have been using Bactroban to small open areas predominantly on the right anterior lower leg His history is complicated by the fact that he has rheumatoid arthritis followed by rheumatology. He is followed by neurology for disabling headaches. At one point this was felt to be giant cell arteritis although a left temporal biopsy was apparently negative. He was given a prolonged course of prednisone at 60 mg which managed his sedimentation rates but apparently did not prove improve the headaches. This is been tapered to off on by rheumatology on 03/13/17 Vascular had plans to do a venous reflux workup as well as arterial studies in May. They also wanted to get him a lymphedema pump. As mentioned he's been using bacitracin under Unna boot wraps to both lower legs 04/15/17; the patient arrives with most of his wounds improved. These are small punched out wounds. Most of them remaining ones are on the right anterior calf with the most problematic over the right lateral malleolus. There are no new areas. The symptom complex or potential symptom complex we are dealing with his chronic disabling headaches with inflammatory markers not responsive to prednisone and with a negative temporal biopsy, lower extremity weakness, skin ulcerations just on the right leg. We have managed to get his arterial studies moved up to April 30. He has a  rheumatology consult at [facility] in June. He has seen dermatology locally, rheumatology locally, neurology locally. 04/22/17; small punched out areas on the right leg anteriorly posteriorly. Most of these appear to have closed over. Some of them have eschar over the surface. The most problematic area appears to be over the right lateral malleolus. We've been using prisma to all of this. He has arterial studies on April 30 and a rheumatology consult at [facility] on June 28 04/29/17; most of the small punched out areas on the right leg posteriorly are closed. He has 2 or 3 openings anteriorly but most of these appear to be on the way to closing. Still problematically over the right lateral malleolus and right lateral [name] with almost ischemic-looking eschar. His arterial studies that I ordered are due to be done next week on the 30th so we should have them available for our next visit hopefully. He also saw a rheumatologist at [facility] and according to the patient he did 8 vials of blood. Finally he has scaling rash on his left [name] and what looks to be at [facility] area on the left anterior leg 05/06/17; most of the small punched out areas on the right leg posteriorly and anteriorly are closed. He still has one small one [name], Jermaine E. ([id number]) anteriorly one over the right lateral malleolus and one over the right lateral [name]. He had his arterial studies they didn't seem to do waveform analysis not exactly sure why however in any case is ABIs were noncompressible bilaterally. They did provide TBIs although looking at the pressures it appears that his TBIs are quite normal.  I therefore went ahead and debrided the area over the right lateral malleolus and the right lateral [name] 05/13/17; most of the small punched out areas on the right leg posteriorly and anteriorly are closed. He continues to have problematic areas over the right lateral malleolus and the right lateral [name]. He still requiring aggressive  debridement of these 2 wounds using silver collagen I reviewed the note from rheumatology I don't think they came up with a specific diagnosis although he is known to have seropositive RA. They did a panel of lab work when I was able to see his his AMA was negative, anti-smooth muscle antibodies negative antineutrophil cytoplasmic antibodies negative,Liv/kia type 1 negative. Serum C3 and C4 were negative. I don't see his muscle enzymes specifically. He was referred back to his local rheumatologist for management of his known rheumatoid arthritis.the patient states he still feels weak and fatigued. He states he has numbness in both feet and apparently is known to have neuropathy 05/20/17; the patient continues to have a difficult problem on the right lateral malleolus and the right lateral [name]. Both of these wounds have no viable surface even with attempts at debridement. He has a new wound on the left plantar metatarsal head which looks more like a superficial diabetic pressure related injury then part of this underlying issue he has. Most of the rest of the wounds on his legs look satisfactory. Mostly on the right calf.reviewed his arterial studies which showed noncompressible vessels bilaterally but the TBIs were quite normal. 05/27/17; no real improvement in the right lateral malleolus and right lateral [name]. In fact the right lateral [name] is now on bone. I gave him doxycycline empirically last week it appears that he developed photosensitivity was 66 the son in a tractor. I'll not give him any more of this. This is predominantly on his face and dorsal forearms and hands. I given this more as an anti- inflammatory. I'm going to have him seen by vascular surgery. His TBIs that he had done previously ordered by Dr. Brigitte Pulse were in the normal range They never did full arterial studies on him. he now has small punched out wounds on the right lateral ankle and right lateral [name]. I doubt these are  ischemic however I would like a review of his macrovascular status. He does describe some pain at night. I'll reduce the compression from 3 layer to 2 layer and I'm not convinced that this is a macrovascular issue however I want to make sure. We've been using silver alginate Objective Constitutional Patient is hypotensive. however he appears well and his blood pressure is usually in this range. Pulse regular and within target range for patient.[name] Respirations regular, non-labored and within target range.. Temperature is normal and within the target range for the patient.[name] appears in no distress. Vitals Time Taken: 8:57 AM, Height: 68 in, Temperature: 97.6 F, Pulse: 64 bpm, Respiratory Rate: 16 breaths/min, Blood Pressure: 91/46 mmHg. Eyes Conjunctivae clear. No discharge. Cardiovascular he has weak dorsalis pedis pulses.. Lymphatic none palpable in the popliteal or inguinal area. General Notes: wound exam; multiple areas on the right posterior right anterior calf are healed. He continues to have [name], Jermaine E. ([id number]) problematic areas over the right lateral malleolus and right lateral [name] which are deep punched out areas. No debridement today. The lateral [name] wound is right on bone. No evidence of infection -The area on the plantar aspect of the left first metatarsal head is superficial and looks like it's about to close.  Integumentary (Hair, Skin) he has what appears to be a photosensitivity rash from doxycycline. He was apparently in a tractor out in the sun. This is on his face and dorsal hands and forearms. He only has one more of these left. Wound #4 status is Open. Original cause of wound was Gradually Appeared. The wound is located on the Right,Proximal,Lateral Lower Leg. The wound measures 0.1cm length x 0.1cm width x 0.1cm depth; 0.008cm^2 area and 0.001cm^3 volume. There is no tunneling or undermining noted. There is a none present amount of drainage noted. The wound  margin is flat and intact. There is no granulation within the wound bed. There is a large (67-100%) amount of necrotic tissue within the wound bed including Eschar. The periwound skin appearance did not exhibit: Callus, Crepitus, Excoriation, Induration, Rash, Scarring, Dry/Scaly, Maceration, Atrophie Blanche, Cyanosis, Ecchymosis, Hemosiderin Staining, Mottled, Pallor, Rubor, Erythema. Periwound temperature was noted as No Abnormality. Wound #6 status is Open. Original cause of wound was Gradually Appeared. The wound is located on the Right,Lateral Malleolus. The wound measures 0.6cm length x 0.5cm width x 0.4cm depth; 0.236cm^2 area and 0.094cm^3 volume. There is no tunneling or undermining noted. There is a medium amount of serosanguineous drainage noted. The wound margin is distinct with the outline attached to the wound base. There is no granulation within the wound bed. There is a large (67-100%) amount of necrotic tissue within the wound bed including Eschar and Adherent Slough. The periwound skin appearance did not exhibit: Callus, Crepitus, Excoriation, Induration, Rash, Scarring, Dry/Scaly, Maceration, Atrophie Blanche, Cyanosis, Ecchymosis, Hemosiderin Staining, Mottled, Pallor, Rubor, Erythema. Periwound temperature was noted as No Abnormality. The periwound has tenderness on palpation. Wound #8 status is Open. Original cause of wound was Gradually Appeared. The wound is located on the Right,Lateral [name]. The wound measures 0.8cm length x 0.7cm width x 0.3cm depth; 0.44cm^2 area and 0.132cm^3 volume. There is no tunneling noted, however, there is undermining starting at 10:00 and ending at 2:00 with a maximum distance of 0.4cm. There is a medium amount of serosanguineous drainage noted. The wound margin is flat and intact. There is no granulation within the wound bed. There is a large (67-100%) amount of necrotic tissue within the wound bed including Eschar and Adherent Slough. The  periwound skin appearance did not exhibit: Callus, Crepitus, Excoriation, Induration, Rash, Scarring, Dry/Scaly, Maceration, Atrophie Blanche, Cyanosis, Ecchymosis, Hemosiderin Staining, Mottled, Pallor, Rubor, Erythema. Periwound temperature was noted as No Abnormality. Wound #9 status is Open. Original cause of wound was Gradually Appeared. The wound is located on the Left Metatarsal head first. The wound measures 0.5cm length x 0.5cm width x 0.1cm depth; 0.196cm^2 area and 0.02cm^3 volume. There is Fat Layer (Subcutaneous Tissue) Exposed exposed. There is no tunneling or undermining noted. There is a medium amount of serous drainage noted. The wound margin is flat and intact. There is medium (34-66%) pink granulation within the wound bed. There is a medium (34-66%) amount of necrotic tissue within the wound bed including Adherent Slough. The periwound skin appearance did not exhibit: Callus, Crepitus, Excoriation, Induration, Rash, Scarring, Dry/Scaly, Maceration, Atrophie Blanche, Cyanosis, Ecchymosis, Hemosiderin Staining, Mottled, Pallor, Rubor, Erythema. Periwound temperature was noted as No Abnormality. 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 Wound #6 Right,Lateral Malleolus: Clean wound with Normal Saline. Wound #8 Right,Lateral [name]: Clean wound with Normal Saline. Wound #9 Left Metatarsal head first: Clean wound with Normal Saline. Anesthetic (add to Medication List): Wound #4 Right,Proximal,Lateral Lower Leg: Topical Lidocaine 4% cream applied to wound bed  prior to debridement (In Clinic Only). Wound #6 Right,Lateral Malleolus: Topical Lidocaine 4% cream applied to wound bed prior to debridement (In Clinic Only). Wound #8 Right,Lateral [name]: Topical Lidocaine 4% cream applied to wound bed prior to debridement (In Clinic Only). Wound #9 Left Metatarsal head first: Topical Lidocaine 4% cream applied to wound bed prior to debridement (In Clinic Only). Skin Barriers/Peri-Wound Care: Wound #4 Right,Proximal,Lateral Lower Leg: Triamcinolone Acetonide Ointment (TAC) Wound #6 Right,Lateral Malleolus: Triamcinolone Acetonide Ointment (TAC) Wound #8 Right,Lateral [name]: Triamcinolone Acetonide Ointment (TAC) Wound #9 Left Metatarsal head first: Triamcinolone Acetonide Ointment (TAC) Primary Wound Dressing: Wound #4 Right,Proximal,Lateral Lower Leg: Silver Collagen - all wounds Wound #6 Right,Lateral Malleolus: Silver Collagen - all wounds Wound #8 Right,Lateral [name]: Silver Collagen - all wounds Wound #9 Left Metatarsal head first: Silver Collagen - all wounds Secondary Dressing: Wound #4 Right,Proximal,Lateral Lower Leg: ABD pad - over collegen, Unna paste to Anchor at top Wound #6 Right,Lateral Malleolus: ABD pad - over collegen, Unna paste to Anchor at top Wound #8 Right,Lateral [name]: ABD pad - over collegen, Unna paste to Anchor at top Wound #9 Left Metatarsal head first: ABD pad - over collegen, Unna paste to Anchor at top Dressing Change Frequency: Wound #4 Right,Proximal,Lateral Lower Leg: Change Dressing Monday, Wednesday, Friday Wound #6 Right,Lateral Malleolus: Change Dressing Monday, Wednesday, Friday Wound #8 Right,Lateral [name]: Change Dressing Monday, Wednesday, Friday Wound #9 Left Metatarsal head first: Change Dressing Monday, Wednesday, Friday LENN, [name] ([id number]) Follow-up Appointments: Wound #4 Right,Proximal,Lateral Lower Leg: Return Appointment in 1 week. Wound #6 Right,Lateral Malleolus: Return  Appointment in 1 week. Wound #8 Right,Lateral [name]: Return Appointment in 1 week. Wound #9 Left Metatarsal head first: Return Appointment in 1 week. Edema Control: Wound #4 Right,Proximal,Lateral Lower Leg: Kerlix and Coban - Right Lower Extremity Wound #6 Right,Lateral Malleolus: Kerlix and Coban - Right Lower Extremity Wound #8 Right,Lateral [name]: Kerlix and Coban - Right Lower Extremity Wound #9 Left Metatarsal head first: Kerlix and Coban - Right Lower Extremity Home Health: Wound #4 Right,Proximal,Lateral Lower Leg: [place] Nurse may visit PRN to address patient s wound care needs. FACE TO FACE ENCOUNTER: MEDICARE and MEDICAID PATIENTS: I certify that this patient is under my care and that I had a face-to-face encounter that meets the physician face-to-face encounter requirements with this patient on this date. The encounter with the patient was in whole or in part for the following MEDICAL CONDITION: (primary reason for [place]) MEDICAL NECESSITY: I certify, that based on my findings, NURSING services are a medically necessary home health service. HOME BOUND STATUS: I certify that my clinical findings support that this patient is homebound (i.e., Due to illness or injury, pt requires aid of supportive devices such as crutches, cane, wheelchairs, walkers, the use of special transportation or the assistance of another person to leave their place of residence. There is a normal inability to leave the home and doing so requires considerable and taxing effort. Other absences are for medical reasons / religious services and are infrequent or of short duration when for other reasons). If current dressing causes  regression in wound condition, may D/C ordered dressing product/s and apply Normal Saline Moist Dressing daily until next [place] / Other MD appointment. [place] of regression in wound condition at  [phone]. Please direct any NON-WOUND related issues/requests for orders to patient's Primary Care Physician Wound #6 Right,Lateral Malleolus: [place] Nurse may visit PRN to address patient s wound care needs. FACE TO FACE ENCOUNTER: MEDICARE and MEDICAID PATIENTS: I certify that this patient is under my care and that I had a face-to-face encounter that meets the physician face-to-face encounter requirements with this patient on this date. The encounter with the patient was in whole or in part for the following MEDICAL CONDITION: (primary reason for [place]) MEDICAL NECESSITY: I certify, that based on my findings, NURSING services are a medically necessary home health service. HOME BOUND STATUS: I certify that my clinical findings support that this patient is homebound (i.e., Due to illness or injury, pt requires aid of supportive devices such as crutches, cane, wheelchairs, walkers, the use of special transportation or the assistance of another person to leave their place of residence. There is a normal inability to leave the home and doing so requires considerable and taxing effort. Other absences are for medical reasons / religious services and are infrequent or of short duration when for other reasons). If current dressing causes regression in wound condition, may D/C ordered dressing product/s and apply Normal Saline Moist Dressing daily until next [place] / Other MD appointment. [place] of regression in wound condition at [phone]. Please direct any NON-WOUND related issues/requests for orders to patient's Primary Care Physician Wound #8 Right,Lateral [name]: [place] Nurse may visit PRN to address patient s wound care needs. FACE TO FACE ENCOUNTER: MEDICARE and MEDICAID PATIENTS: I certify that this patient is under my care and that I had a face-to-face encounter that meets the  physician face-to-face encounter requirements with this patient on this date. The encounter with the patient was in whole or in part for the following MEDICAL CONDITION: (primary reason for [place]) MEDICAL NECESSITY: I certify, that based on my findings, NURSING services are a medically necessary home health service. HOME BOUND STATUS: I certify that my clinical findings support that this patient is homebound (i.e., Due to illness or injury, pt requires aid of supportive devices such as crutches, cane, wheelchairs, walkers, the use of special [name], Jermaine E. ([id number]) transportation or the assistance of another person to leave their place of residence. There is a normal inability to leave the home and doing so requires considerable and taxing effort. Other absences are for medical reasons / religious services and are infrequent or of short duration when for other reasons). If current dressing causes regression in wound condition, may D/C ordered dressing product/s and apply Normal Saline Moist Dressing daily until next [place] / Other MD appointment. [name] of regression in wound condition at [phone]. Please direct any NON-WOUND related issues/requests for orders to patient's Primary Care Physician Wound #9 Left Metatarsal head first: [place] Nurse may visit PRN to address patient s wound care needs. FACE TO FACE ENCOUNTER: MEDICARE and MEDICAID PATIENTS: I certify that this patient is under my care and that I had a face-to-face encounter that meets the physician face-to-face encounter requirements with this patient on this date. The encounter with the patient was in whole or in part  for the following MEDICAL CONDITION: (primary reason for Home Healthcare) MEDICAL NECESSITY: I certify, that based on my findings, NURSING services are a medically necessary home health service. HOME BOUND STATUS: I certify that my  clinical findings support that this patient is homebound (i.e., Due to illness or injury, pt requires aid of supportive devices such as crutches, cane, wheelchairs, walkers, the use of special transportation or the assistance of another person to leave their place of residence. There is a normal inability to leave the home and doing so requires considerable and taxing effort. Other absences are for medical reasons / religious services and are infrequent or of short duration when for other reasons). If current dressing causes regression in wound condition, may D/C ordered dressing product/s and apply Normal Saline Moist Dressing daily until next [place] / Other MD appointment. [name] of regression in wound condition at [phone]. Please direct any NON-WOUND related issues/requests for orders to patient's Primary Care Physician Medications-please add to medication list.: Wound #4 Right,Proximal,Lateral Lower Leg: P.O. Antibiotics - Complete Doxycycine - Stay out of the sun! Wound #6 Right,Lateral Malleolus: P.O. Antibiotics - Complete Doxycycine - Stay out of the sun! Wound #8 Right,Lateral [name]: P.O. Antibiotics - Complete Doxycycine - Stay out of the sun! Wound #9 Left Metatarsal head first: P.O. Antibiotics - Complete Doxycycine - Stay out of the sun! Consults ordered were: Vascular - MD [place] Radiology ordered were: X-ray, ankle - Right [name] and Ankle #1 we are going to continue with silver collagen to the 2 areas on the right lateral [name] and ankle #2 the new area on the left first metatarsal head is superficial. We applied silver collagen here as well #3 I'm going to x-ray the right [name] and ankle wounds have been deteriorating. #4 I'm going ask vascular surgery to review his macrovascular status. This is a complicated patient who probably has more than one issue here. Previous arterial studies were limited to TBIs and they look like they're within  the normal range. #5 the patient has a photosensitivity rash secondary to doxycycline. He is out in a tractor in the sun. Apparently the pharmacy told him about this. I've asked him to try and keep off the right [name] is much as possible. Electronic Signature(s) Signed: 05/27/2017 10:53:18 AM By: Linton Ham MD Entered By: Linton Ham on 05/27/2017 10:53:17 [place], [name] ([id number]) -------------------------------------------------------------------------------- SuperBill Details Patient Name: [name] Date of Service: 05/27/2017 Medical Record Number: [id number] Patient Account Number: [id number] Date of Birth/Sex: November 24, 1940 (77 y.o. 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 I25.5 Ischemic cardiomyopathy Facility Procedures CPT4 Code: [id number] Description: 99213 - WOUND CARE VISIT-LEV 3 EST PT Modifier: Quantity: 1 Physician Procedures CPT4 Code Description: [id number] 99213 - WC PHYS LEVEL 3 - EST PT ICD-10 Diagnosis Description L97.511 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 limited L97.211 Non-pressure chronic ulcer of right calf limited to breakdown o L97.521  Non-pressure chronic ulcer of other part of left [name] limited t Modifier: to breakdown of f skin o breakdown of s Quantity: 1 skin kin Electronic Signature(s) Signed: 05/27/2017  1:33:36 PM By: Gretta Cool, BSN, RN, CWS, Kim RN, BSN Signed: 05/27/2017 5:10:30 PM By: Linton Ham MD Entered By: Gretta Cool, BSN, RN, CWS, Kim on 05/27/2017 13:33:35

## 2017-05-29 NOTE — Progress Notes (Signed)
LAMARK, SCHUE ([id number]) Visit Report for 05/27/2017 Arrival Information Details Patient Name: [name] Date of Service: 05/27/2017 8:45 AM Medical Record Number: [id number] Patient Account Number: [id number] Date of Birth/Sex: 1941/01/07 (76 y.o. M) Treating RN: Montey Hora Primary Care Nelsie Domino: Cyndi Bender Other Clinician: Referring Haylie Mccutcheon: Cyndi Bender Treating Lanitra Battaglini/Extender: Tito Dine in Treatment: 7 Visit Information History Since Last Visit Added or deleted any medications: No Patient Arrived: Cane Any new allergies or adverse reactions: No Arrival Time: 08:50 Had a fall or experienced change in No Accompanied By: spouse activities of daily living that may affect Transfer Assistance: None risk of falls: Patient Identification Verified: Yes Signs or symptoms of abuse/neglect since last visito No Secondary Verification Process Completed: Yes Hospitalized since last visit: No Patient Requires Transmission-Based No Implantable device outside of the clinic excluding No Precautions: cellular tissue based products placed in the center Patient Has Alerts: Yes since last visit: Patient Alerts: R ABI Has Dressing in Place as Prescribed: Yes >220 Has Compression in Place as Prescribed: Yes Pain Present Now: No Electronic Signature(s) Signed: 05/27/2017 4:55:05 PM By: Montey Hora Entered By: Montey Hora on 05/27/2017 08:56:33 Tiffany, [name] ([id number]) -------------------------------------------------------------------------------- Clinic Level of Care Assessment Details Patient Name: [name] Date of Service: 05/27/2017 8:45 AM Medical Record Number: [id number] Patient Account Number: [id number] Date of Birth/Sex: 12/26/1940 (76 y.o. M) Treating RN: Cornell Barman Primary Care Alizea Pell: Cyndi Bender Other Clinician: Referring Shirah Roseman: Cyndi Bender Treating Mingo Siegert/Extender: Tito Dine in Treatment:  7 Clinic Level of Care Assessment Items TOOL 4 Quantity Score []  - Use when only an EandM is performed on FOLLOW-UP visit 0 ASSESSMENTS - Nursing Assessment / Reassessment []  - Reassessment of Co-morbidities (includes updates in patient status) 0 X- 1 5 Reassessment of Adherence to Treatment Plan ASSESSMENTS - Wound and Skin Assessment / Reassessment []  - Simple Wound Assessment / Reassessment - one wound 0 X- 1 5 Complex Wound Assessment / Reassessment - multiple wounds []  - 0 Dermatologic / Skin Assessment (not related to wound area) ASSESSMENTS - Focused Assessment []  - Circumferential Edema Measurements - multi extremities 0 []  - 0 Nutritional Assessment / Counseling / Intervention []  - 0 Lower Extremity Assessment (monofilament, tuning fork, pulses) []  - 0 Peripheral Arterial Disease Assessment (using hand held doppler) ASSESSMENTS - Ostomy and/or Continence Assessment and Care []  - Incontinence Assessment and Management 0 []  - 0 Ostomy Care Assessment and Management (repouching, etc.) PROCESS - Coordination of Care []  - Simple Patient / Family Education for ongoing care 0 X- 1 20 Complex (extensive) Patient / Family Education for ongoing care X- 1 10 Staff obtains Programmer, systems, Records, Test Results / Process Orders []  - 0 Staff telephones HHA, Nursing Homes / Clarify orders / etc []  - 0 Routine Transfer to another Facility (non-emergent condition) []  - 0 Routine Hospital Admission (non-emergent condition) []  - 0 New Admissions / Biomedical engineer / Ordering NPWT, Apligraf, etc. []  - 0 Emergency Hospital Admission (emergent condition) []  - 0 Simple Discharge Coordination Majer, Robey E. ([id number]) X- 1 15 Complex (extensive) Discharge Coordination PROCESS - Special Needs []  - Pediatric / Minor Patient Management 0 []  - 0 Isolation Patient Management []  - 0 Hearing / Language / Visual special needs []  - 0 Assessment of Community assistance  (transportation, D/C planning, etc.) []  - 0 Additional assistance / Altered mentation []  - 0 Support Surface(s) Assessment (bed, cushion, seat, etc.) INTERVENTIONS - Wound Cleansing / Measurement []  - Simple Wound Cleansing -  one wound 0 X- 1 5 Complex Wound Cleansing - multiple wounds X- 1 5 Wound Imaging (photographs - any number of wounds) []  - 0 Wound Tracing (instead of photographs) []  - 0 Simple Wound Measurement - one wound X- 1 5 Complex Wound Measurement - multiple wounds INTERVENTIONS - Wound Dressings []  - Small Wound Dressing one or multiple wounds 0 []  - 0 Medium Wound Dressing one or multiple wounds X- 2 20 Large Wound Dressing one or multiple wounds []  - 0 Application of Medications - topical []  - 0 Application of Medications - injection INTERVENTIONS - Miscellaneous []  - External ear exam 0 []  - 0 Specimen Collection (cultures, biopsies, blood, body fluids, etc.) []  - 0 Specimen(s) / Culture(s) sent or taken to Lab for analysis []  - 0 Patient Transfer (multiple staff / Civil Service fast streamer / Similar devices) []  - 0 Simple Staple / Suture removal (25 or less) []  - 0 Complex Staple / Suture removal (26 or more) []  - 0 Hypo / Hyperglycemic Management (close monitor of Blood Glucose) []  - 0 Ankle / Brachial Index (ABI) - do not check if billed separately X- 1 5 Vital Signs Grego, Calan E. ([id number]) Has the patient been seen at the hospital within the last three years: Yes Total Score: 115 Level Of Care: New/Established - Level 3 Electronic Signature(s) Signed: 05/27/2017 5:19:09 PM By: Gretta Cool, BSN, RN, CWS, Kim RN, BSN Entered By: Gretta Cool, BSN, RN, CWS, Kim on 05/27/2017 13:33:16 Beam, [name] ([id number]) -------------------------------------------------------------------------------- Encounter Discharge Information Details Patient Name: [name] Date of Service: 05/27/2017 8:45 AM Medical Record Number: [id number] Patient Account Number:  [id number] Date of Birth/Sex: [birth year] (76 y.o. M) Treating RN: Montey Hora Primary Care Hillary Struss: Cyndi Bender Other Clinician: Referring Lana Flaim: Cyndi Bender Treating Oleta Gunnoe/Extender: Tito Dine in Treatment: 7 Encounter Discharge Information Items Discharge Condition: Stable Ambulatory Status: Cane Discharge Destination: Home Transportation: Private Auto Accompanied By: spouse Schedule Follow-up Appointment: Yes Clinical Summary of Care: Electronic Signature(s) Signed: 05/27/2017 12:36:03 PM By: Montey Hora Entered By: Montey Hora on 05/27/2017 12:36:03 Tung, [name] ([id number]) -------------------------------------------------------------------------------- Lower Extremity Assessment Details Patient Name: [name] Date of Service: 05/27/2017 8:45 AM Medical Record Number: [id number] Patient Account Number: [id number] Date of Birth/Sex: 11-17-1940 (76 y.o. M) Treating RN: Montey Hora Primary Care Ryin Schillo: Cyndi Bender Other Clinician: Referring Eldridge Marcott: Cyndi Bender Treating Vrinda Heckstall/Extender: Tito Dine in Treatment: 7 Edema Assessment Assessed: [Left: No] [Right: No] [Left: Edema] [Right: :] Calf Left: Right: Point of Measurement: 27 cm From Medial Instep cm 28.5 cm Ankle Left: Right: Point of Measurement: 11 cm From Medial Instep cm 21.7 cm Vascular Assessment Pulses: Dorsalis Pedis Palpable: [Left:Yes] [Right:Yes] Posterior Tibial Extremity colors, hair growth, and conditions: Extremity Color: [Left:Mottled] [Right:Normal] Hair Growth on Extremity: [Left:No] [Right:No] Temperature of Extremity: [Left:Warm] [Right:Warm] Capillary Refill: [Left:< 3 seconds] [Right:< 3 seconds] Toe Nail Assessment Left: Right: Thick: Yes Yes Discolored: Yes Yes Deformed: Yes Yes Improper Length and Hygiene: No No Electronic Signature(s) Signed: 05/27/2017 4:55:05 PM By: Montey Hora Entered By: Montey Hora on 05/27/2017 09:03:16 Mander, [name] ([id number]) -------------------------------------------------------------------------------- Multi Wound Chart Details Patient Name: [name] Date of Service: 05/27/2017 8:45 AM Medical Record Number: [id number] Patient Account Number: [id number] Date of Birth/Sex: 12/30/1940 (76 y.o. M) Treating RN: Cornell Barman Primary Care Kate Sweetman: Cyndi Bender Other Clinician: Referring Jylian Pappalardo: Cyndi Bender Treating Tavonte Seybold/Extender: Tito Dine in Treatment: 7 Vital Signs Height(in): 68 Pulse(bpm): 64 Weight(lbs): Blood Pressure(mmHg): 91/46 Body Mass Index(BMI):  Temperature(F): 97.6 Respiratory Rate 16 (breaths/min): Photos: [4:No Photos] [6:No Photos] [8:No Photos] Wound Location: [4:Right Lower Leg - Lateral, Proximal] [6:Right Malleolus - Lateral] [8:Right [name] - Lateral] Wounding Event: [4:Gradually Appeared] [6:Gradually Appeared] [8:Gradually Appeared] Primary Etiology: [4:Arterial Insufficiency Ulcer] [6:Arterial Insufficiency Ulcer] [8:Arterial Insufficiency Ulcer] Comorbid History: [4:Congestive Heart Failure, Coronary Artery Disease, Hypertension, Myocardial Infarction, Osteoarthritis] [6:Congestive Heart Failure, Coronary Artery Disease, Hypertension, Myocardial Infarction, Osteoarthritis] [8:Congestive Heart  Failure, Coronary Artery Disease, Hypertension, Myocardial Infarction, Osteoarthritis] Date Acquired: [4:04/08/2017] [6:03/23/2017] [8:04/29/2017] Weeks of Treatment: [4:7] [6:7] [8:4] Wound Status: [4:Open] [6:Open] [8:Open] Measurements L x W x D [4:0.1x0.1x0.1] [6:0.6x0.5x0.4] [8:0.8x0.7x0.3] (cm) Area (cm) : [4:0.008] [6:0.236] [8:0.44] Volume (cm) : [4:0.001] [6:0.094] [8:0.132] % Reduction in Area: [4:95.90%] [6:-661.30%] [8:-519.70%] % Reduction in Volume: [4:98.30%] [6:-3033.30%] [8:-1785.70%] Starting Position 1 [8:10] (o'clock): Ending Position 1 [8:2] (o'clock): Maximum Distance 1  (cm): [8:0.4] Undermining: [4:No] [6:No] [8:Yes] Classification: [4:Full Thickness Without Exposed Support Structures] [6:Full Thickness Without Exposed Support Structures] [8:Partial Thickness] Exudate Amount: [4:None Present] [6:Medium] [8:Medium] Exudate Type: [4:N/A] [6:Serosanguineous] [8:Serosanguineous] Exudate Color: [4:N/A] [6:red, brown] [8:red, brown] Wound Margin: [4:Flat and Intact] [6:Distinct, outline attached] [8:Flat and Intact] Granulation Amount: [4:None Present (0%)] [6:None Present (0%)] [8:None Present (0%)] Granulation Quality: [4:N/A] [6:N/A] [8:N/A] Necrotic Amount: [4:Large (67-100%)] [6:Large (67-100%)] [8:Large (67-100%)] Necrotic Tissue: [4:Eschar] [6:Eschar, Adherent Slough] [8:Eschar, Adherent Slough] Exposed Structures: [4:Fascia: No Fat Layer (Subcutaneous Tissue) Exposed: No] [6:Fascia: No Fat Layer (Subcutaneous Tissue) Exposed: No] [8:Fascia: No Fat Layer (Subcutaneous Tissue) Exposed: No] Tendon: No Tendon: No Tendon: No Muscle: No Muscle: No Muscle: No Joint: No Joint: No Joint: No Bone: No Bone: No Bone: No Epithelialization: Large (67-100%) None None Periwound Skin Texture: Excoriation: No Excoriation: No Excoriation: No Induration: No Induration: No Induration: No Callus: No Callus: No Callus: No Crepitus: No Crepitus: No Crepitus: No Rash: No Rash: No Rash: No Scarring: No Scarring: No Scarring: No Periwound Skin Moisture: Maceration: No Maceration: No Maceration: No Dry/Scaly: No Dry/Scaly: No Dry/Scaly: No Periwound Skin Color: Atrophie Blanche: No Atrophie Blanche: No Atrophie Blanche: No Cyanosis: No Cyanosis: No Cyanosis: No Ecchymosis: No Ecchymosis: No Ecchymosis: No Erythema: No Erythema: No Erythema: No Hemosiderin Staining: No Hemosiderin Staining: No Hemosiderin Staining: No Mottled: No Mottled: No Mottled: No Pallor: No Pallor: No Pallor: No Rubor: No Rubor: No Rubor: No Temperature: No  Abnormality No Abnormality No Abnormality Tenderness on Palpation: No Yes No Wound Preparation: Ulcer Cleansing: Ulcer Cleansing: Ulcer Cleansing: Rinsed/Irrigated with Saline, Rinsed/Irrigated with Saline, Rinsed/Irrigated with Saline, Other: soap and water Other: soap and water Other: soap and water Topical Anesthetic Applied: Topical Anesthetic Applied: Topical Anesthetic Applied: Other: lidocaine 4% Other: lidocaine 4% Other: lidocaine 4% Wound Number: 9 N/A N/A Photos: No Photos N/A N/A Wound Location: Left Metatarsal head first N/A N/A Wounding Event: Gradually Appeared N/A N/A Primary Etiology: Pressure Ulcer N/A N/A Comorbid History: Congestive Heart Failure, N/A N/A Coronary Artery Disease, Hypertension, Myocardial Infarction, Osteoarthritis Date Acquired: 05/06/2017 N/A N/A Weeks of Treatment: 1 N/A N/A Wound Status: Open N/A N/A Measurements L x W x D 0.5x0.5x0.1 N/A N/A (cm) Area (cm) : 0.196 N/A N/A Volume (cm) : 0.02 N/A N/A % Reduction in Area: 0.00% N/A N/A % Reduction in Volume: 0.00% N/A N/A Undermining: No N/A N/A Classification: Category/Stage II N/A N/A Exudate Amount: Medium N/A N/A Exudate Type: Serous N/A N/A Exudate Color: amber N/A N/A Wound Margin: Flat and Intact N/A N/A Granulation Amount: Medium (34-66%) N/A N/A Granulation Quality: Pink N/A N/A Necrotic Amount: Medium (34-66%) N/A  N/A TARIS, GALINDO ([id number]) Necrotic Tissue: Adherent Slough N/A N/A Exposed Structures: Fat Layer (Subcutaneous N/A N/A Tissue) Exposed: Yes Fascia: No Tendon: No Muscle: No Joint: No Bone: No Epithelialization: None N/A N/A Periwound Skin Texture: Excoriation: No N/A N/A Induration: No Callus: No Crepitus: No Rash: No Scarring: No Periwound Skin Moisture: Maceration: No N/A N/A Dry/Scaly: No Periwound Skin Color: Atrophie Blanche: No N/A N/A Cyanosis: No Ecchymosis: No Erythema: No Hemosiderin Staining: No Mottled: No Pallor:  No Rubor: No Temperature: No Abnormality N/A N/A Tenderness on Palpation: No N/A N/A Wound Preparation: Ulcer Cleansing: N/A N/A Rinsed/Irrigated with Saline Topical Anesthetic Applied: Other: lidocaine 4% Treatment Notes Electronic Signature(s) Signed: 05/27/2017 5:10:30 PM By: Linton Ham MD Entered By: Linton Ham on 05/27/2017 10:41:40 Deignan, [name] ([id number]) -------------------------------------------------------------------------------- Multi-Disciplinary Care Plan Details Patient Name: [name] Date of Service: 05/27/2017 8:45 AM Medical Record Number: [id number] Patient Account Number: [id number] Date of Birth/Sex: 1940/04/10 (76 y.o. M) Treating RN: Cornell Barman Primary Care Mckenzie Bove: Cyndi Bender Other Clinician: Referring Eddi Hymes: Cyndi Bender Treating Shakeema Lippman/Extender: Tito Dine in Treatment: 7 Active Inactive ` Abuse / Safety / Falls / Self Care Management Nursing Diagnoses: History of Falls Goals: Patient will remain injury free related to falls Date Initiated: 04/08/2017 Target Resolution Date: 05/08/2017 Goal Status: Active Interventions: Assess fall risk on admission and as needed Notes: ` Orientation to the Wound Care Program Nursing Diagnoses: Knowledge deficit related to the wound healing center program Goals: Patient/caregiver will verbalize understanding of the [facility] Date Initiated: 04/08/2017 Target Resolution Date: 05/08/2017 Goal Status: Active Interventions: Provide education on orientation to the wound center Notes: ` Soft Tissue Infection Nursing Diagnoses: Impaired tissue integrity Potential for infection: soft tissue Goals: Patient will remain free of wound infection Date Initiated: 04/08/2017 Target Resolution Date: 05/08/2017 Goal Status: Active TYMEER, VAQUERA ([id number]) Interventions: Assess signs and symptoms of infection every visit Notes: ` Wound/Skin  Impairment Nursing Diagnoses: Impaired tissue integrity Goals: Ulcer/skin breakdown will heal within 14 weeks Date Initiated: 04/08/2017 Target Resolution Date: 07/20/2017 Goal Status: Active Interventions: Assess patient/caregiver ability to perform ulcer/skin care regimen upon admission and as needed Provide education on ulcer and skin care Treatment Activities: Topical wound management initiated : 04/08/2017 Notes: Electronic Signature(s) Signed: 05/27/2017 5:19:09 PM By: Gretta Cool, BSN, RN, CWS, Kim RN, BSN Entered By: Gretta Cool, BSN, RN, CWS, Kim on 05/27/2017 09:25:58 Verdejo, [name] ([id number]) -------------------------------------------------------------------------------- Pain Assessment Details Patient Name: [name] Date of Service: 05/27/2017 8:45 AM Medical Record Number: [id number] Patient Account Number: [id number] Date of Birth/Sex: Aug 23, 1940 (76 y.o. M) Treating RN: Montey Hora Primary Care Genesis Novosad: Cyndi Bender Other Clinician: Referring Gillian Meeuwsen: Cyndi Bender Treating Bronislaw Switzer/Extender: Tito Dine in Treatment: 7 Active Problems Location of Pain Severity and Description of Pain Patient Has Paino No Site Locations Pain Management and Medication Current Pain Management: Electronic Signature(s) Signed: 05/27/2017 4:55:05 PM By: Montey Hora Entered By: Montey Hora on 05/27/2017 08:57:11 Perkovich, [name] ([id number]) -------------------------------------------------------------------------------- Patient/Caregiver Education Details Patient Name: [name] Date of Service: 05/27/2017 8:45 AM Medical Record Number: [id number] Patient Account Number: [id number] Date of Birth/Gender: 11-Aug-1940 (76 y.o. M) Treating RN: Montey Hora Primary Care Physician: Cyndi Bender Other Clinician: Referring Physician: Cyndi Bender Treating Physician/Extender: Tito Dine in Treatment: 7 Education Assessment Education  Provided To: Patient Education Topics Provided Venous: Handouts: Other: leg elevation Methods: Explain/Verbal Responses: State content correctly Electronic Signature(s) Signed: 05/27/2017 4:55:05 PM By: Montey Hora Entered By: Montey Hora  on 05/27/2017 12:36:24 ASAHD, CAN ([id number]) -------------------------------------------------------------------------------- Wound Assessment Details Patient Name: LESS, WOOLSEY Date of Service: 05/27/2017 8:45 AM Medical Record Number: [id number] Patient Account Number: [id number] Date of Birth/Sex: [birth year] (76 y.o. M) Treating RN: Montey Hora Primary Care Terrick [name]: Cyndi Bender Other Clinician: Referring Pratyush Ammon: Cyndi Bender Treating Geordie Nooney/Extender: Tito Dine in Treatment: 7 Wound Status Wound Number: 4 Primary Arterial Insufficiency Ulcer Etiology: Wound Location: Right Lower Leg - Lateral, Proximal Wound Open Wounding Event: Gradually Appeared Status: Date Acquired: 04/08/2017 Comorbid Congestive Heart Failure, Coronary Artery Weeks Of Treatment: 7 History: Disease, Hypertension, Myocardial Infarction, Clustered Wound: No Osteoarthritis Photos Photo Uploaded By: Roger Shelter on 05/27/2017 16:24:11 Wound Measurements Length: (cm) 0.1 Width: (cm) 0.1 Depth: (cm) 0.1 Area: (cm) 0.008 Volume: (cm) 0.001 % Reduction in Area: 95.9% % Reduction in Volume: 98.3% Epithelialization: Large (67-100%) Tunneling: No Undermining: No Wound Description Full Thickness Without Exposed Support Classification: Structures Wound Margin: Flat and Intact Exudate None Present Amount: Foul Odor After Cleansing: No Slough/Fibrino Yes Wound Bed Granulation Amount: None Present (0%) Exposed Structure Necrotic Amount: Large (67-100%) Fascia Exposed: No Necrotic Quality: Eschar Fat Layer (Subcutaneous Tissue) Exposed: No Tendon Exposed: No Muscle Exposed: No Joint Exposed: No Bone Exposed:  No Periwound Skin Texture Paisley, Alondra E. ([id number]) Texture Color No Abnormalities Noted: No No Abnormalities Noted: No Callus: No Atrophie Blanche: No Crepitus: No Cyanosis: No Excoriation: No Ecchymosis: No Induration: No Erythema: No Rash: No Hemosiderin Staining: No Scarring: No Mottled: No Pallor: No Moisture Rubor: No No Abnormalities Noted: No Dry / Scaly: No Temperature / Pain Maceration: No Temperature: No Abnormality Wound Preparation Ulcer Cleansing: Rinsed/Irrigated with Saline, Other: soap and water, Topical Anesthetic Applied: Other: lidocaine 4%, Treatment Notes Wound #4 (Right, Proximal, Lateral Lower Leg) 1. Cleansed with: Clean wound with Normal Saline Cleanse wound with antibacterial soap and water 2. Anesthetic Topical Lidocaine 4% cream to wound bed prior to debridement 3. Peri-wound Care: Other peri-wound care (specify in notes) 4. Dressing Applied: Prisma Ag 5. Secondary Dressing Applied ABD Pad 7. Secured with Other (specify in notes) Notes TCA, kerlix/coban wrap Electronic Signature(s) Signed: 05/27/2017 4:55:05 PM By: Montey Hora Entered By: Montey Hora on 05/27/2017 09:06:16 Shearn, [name] ([id number]) -------------------------------------------------------------------------------- Wound Assessment Details Patient Name: [name] Date of Service: 05/27/2017 8:45 AM Medical Record Number: [id number] Patient Account Number: [id number] Date of Birth/Sex: 1940/04/02 (76 y.o. M) Treating RN: Montey Hora Primary Care Infantof Villagomez: Cyndi Bender Other Clinician: Referring Areeb Corron: Cyndi Bender Treating Josefa Syracuse/Extender: Tito Dine in Treatment: 7 Wound Status Wound Number: 6 Primary Arterial Insufficiency Ulcer Etiology: Wound Location: Right Malleolus - Lateral Wound Open Wounding Event: Gradually Appeared Status: Date Acquired: 03/23/2017 Comorbid Congestive Heart Failure, Coronary  Artery Weeks Of Treatment: 7 History: Disease, Hypertension, Myocardial Infarction, Clustered Wound: No Osteoarthritis Photos Photo Uploaded By: Roger Shelter on 05/27/2017 16:24:12 Wound Measurements Length: (cm) 0.6 Width: (cm) 0.5 Depth: (cm) 0.4 Area: (cm) 0.236 Volume: (cm) 0.094 % Reduction in Area: -661.3% % Reduction in Volume: -3033.3% Epithelialization: None Tunneling: No Undermining: No Wound Description Full Thickness Without Exposed Support Classification: Structures Wound Margin: Distinct, outline attached Exudate Medium Amount: Exudate Type: Serosanguineous Exudate Color: red, brown Foul Odor After Cleansing: No Slough/Fibrino Yes Wound Bed Granulation Amount: None Present (0%) Exposed Structure Necrotic Amount: Large (67-100%) Fascia Exposed: No Necrotic Quality: Eschar, Adherent Slough Fat Layer (Subcutaneous Tissue) Exposed: No Tendon Exposed: No Muscle Exposed: No Joint Exposed: No Bone Exposed: No Noyola, Moises E. ([id number]) Periwound Skin  Texture Texture Color No Abnormalities Noted: No No Abnormalities Noted: No Callus: No Atrophie Blanche: No Crepitus: No Cyanosis: No Excoriation: No Ecchymosis: No Induration: No Erythema: No Rash: No Hemosiderin Staining: No Scarring: No Mottled: No Pallor: No Moisture Rubor: No No Abnormalities Noted: No Dry / Scaly: No Temperature / Pain Maceration: No Temperature: No Abnormality Tenderness on Palpation: Yes Wound Preparation Ulcer Cleansing: Rinsed/Irrigated with Saline, Other: soap and water, Topical Anesthetic Applied: Other: lidocaine 4%, Treatment Notes Wound #6 (Right, Lateral Malleolus) 1. Cleansed with: Clean wound with Normal Saline Cleanse wound with antibacterial soap and water 2. Anesthetic Topical Lidocaine 4% cream to wound bed prior to debridement 3. Peri-wound Care: Other peri-wound care (specify in notes) 4. Dressing Applied: Prisma Ag 5. Secondary  Dressing Applied ABD Pad 7. Secured with Other (specify in notes) Notes TCA, kerlix/coban wrap Electronic Signature(s) Signed: 05/27/2017 4:55:05 PM By: Montey Hora Entered By: Montey Hora on 05/27/2017 09:04:40 Bourdeau, [name] ([id number]) -------------------------------------------------------------------------------- Wound Assessment Details Patient Name: [name] Date of Service: 05/27/2017 8:45 AM Medical Record Number: [id number] Patient Account Number: [id number] Date of Birth/Sex: [birth year] (76 y.o. M) Treating RN: Montey Hora Primary Care Sari Cogan: Cyndi Bender Other Clinician: Referring Marcquis Ridlon: Cyndi Bender Treating Bryanah Sidell/Extender: Tito Dine in Treatment: 7 Wound Status Wound Number: 8 Primary Arterial Insufficiency Ulcer Etiology: Wound Location: Right [name] - Lateral Wound Open Wounding Event: Gradually Appeared Status: Date Acquired: 04/29/2017 Comorbid Congestive Heart Failure, Coronary Artery Weeks Of Treatment: 4 History: Disease, Hypertension, Myocardial Infarction, Clustered Wound: No Osteoarthritis Photos Photo Uploaded By: Roger Shelter on 05/27/2017 16:24:47 Wound Measurements Length: (cm) 0.8 Width: (cm) 0.7 Depth: (cm) 0.3 Area: (cm) 0.44 Volume: (cm) 0.132 % Reduction in Area: -519.7% % Reduction in Volume: -1785.7% Epithelialization: None Tunneling: No Undermining: Yes Starting Position (o'clock): 10 Ending Position (o'clock): 2 Maximum Distance: (cm) 0.4 Wound Description Classification: Partial Thickness Wound Margin: Flat and Intact Exudate Amount: Medium Exudate Type: Serosanguineous Exudate Color: red, brown Foul Odor After Cleansing: No Slough/Fibrino Yes Wound Bed Granulation Amount: None Present (0%) Exposed Structure Necrotic Amount: Large (67-100%) Fascia Exposed: No Necrotic Quality: Eschar, Adherent Slough Fat Layer (Subcutaneous Tissue) Exposed: No Tendon Exposed:  No Muscle Exposed: No Carbin, Jaquaveon E. ([id number]) Joint Exposed: No Bone Exposed: No Periwound Skin Texture Texture Color No Abnormalities Noted: No No Abnormalities Noted: No Callus: No Atrophie Blanche: No Crepitus: No Cyanosis: No Excoriation: No Ecchymosis: No Induration: No Erythema: No Rash: No Hemosiderin Staining: No Scarring: No Mottled: No Pallor: No Moisture Rubor: No No Abnormalities Noted: No Dry / Scaly: No Temperature / Pain Maceration: No Temperature: No Abnormality Wound Preparation Ulcer Cleansing: Rinsed/Irrigated with Saline, Other: soap and water, Topical Anesthetic Applied: Other: lidocaine 4%, Treatment Notes Wound #8 (Right, Lateral [name]) 1. Cleansed with: Clean wound with Normal Saline Cleanse wound with antibacterial soap and water 2. Anesthetic Topical Lidocaine 4% cream to wound bed prior to debridement 3. Peri-wound Care: Other peri-wound care (specify in notes) 4. Dressing Applied: Prisma Ag 5. Secondary Dressing Applied ABD Pad 7. Secured with Other (specify in notes) Notes TCA, kerlix/coban wrap Electronic Signature(s) Signed: 05/27/2017 4:55:05 PM By: Montey Hora Entered By: Montey Hora on 05/27/2017 09:05:45 Henriquez, [name] ([id number]) -------------------------------------------------------------------------------- Wound Assessment Details Patient Name: [name] Date of Service: 05/27/2017 8:45 AM Medical Record Number: [id number] Patient Account Number: [id number] Date of Birth/Sex: [birth year] (76 y.o. M) Treating RN: Montey Hora Primary Care Rylen Hou: Cyndi Bender Other Clinician: Referring Haizley Cannella: Cyndi Bender Treating Bridgett Hattabaugh/Extender: Linton Ham  G Weeks in Treatment: 7 Wound Status Wound Number: 9 Primary Pressure Ulcer Etiology: Wound Location: Left Metatarsal head first Wound Open Wounding Event: Gradually Appeared Status: Date Acquired: 05/06/2017 Comorbid Congestive  Heart Failure, Coronary Artery Weeks Of Treatment: 1 History: Disease, Hypertension, Myocardial Infarction, Clustered Wound: No Osteoarthritis Photos Photo Uploaded By: Roger Shelter on 05/27/2017 16:25:23 Wound Measurements Length: (cm) 0.5 Width: (cm) 0.5 Depth: (cm) 0.1 Area: (cm) 0.196 Volume: (cm) 0.02 % Reduction in Area: 0% % Reduction in Volume: 0% Epithelialization: None Tunneling: No Undermining: No Wound Description Classification: Category/Stage II Wound Margin: Flat and Intact Exudate Amount: Medium Exudate Type: Serous Exudate Color: amber Foul Odor After Cleansing: No Slough/Fibrino Yes Wound Bed Granulation Amount: Medium (34-66%) Exposed Structure Granulation Quality: Pink Fascia Exposed: No Necrotic Amount: Medium (34-66%) Fat Layer (Subcutaneous Tissue) Exposed: Yes Necrotic Quality: Adherent Slough Tendon Exposed: No Muscle Exposed: No Joint Exposed: No Bone Exposed: No Periwound Skin Texture Schill, Wallie E. ([id number]) Texture Color No Abnormalities Noted: No No Abnormalities Noted: No Callus: No Atrophie Blanche: No Crepitus: No Cyanosis: No Excoriation: No Ecchymosis: No Induration: No Erythema: No Rash: No Hemosiderin Staining: No Scarring: No Mottled: No Pallor: No Moisture Rubor: No No Abnormalities Noted: No Dry / Scaly: No Temperature / Pain Maceration: No Temperature: No Abnormality Wound Preparation Ulcer Cleansing: Rinsed/Irrigated with Saline Topical Anesthetic Applied: Other: lidocaine 4%, Treatment Notes Wound #9 (Left Metatarsal head first) 1. Cleansed with: Clean wound with Normal Saline 2. Anesthetic Topical Lidocaine 4% cream to wound bed prior to debridement 3. Peri-wound Care: Skin Prep 4. Dressing Applied: Prisma Ag 5. Secondary Dressing Applied Bordered Foam Dressing Electronic Signature(s) Signed: 05/27/2017 4:55:05 PM By: Montey Hora Entered By: Montey Hora on 05/27/2017  09:07:04 Bourquin, [name] ([id number]) -------------------------------------------------------------------------------- [place] Details Patient Name: [name] Date of Service: 05/27/2017 8:45 AM Medical Record Number: [id number] Patient Account Number: [id number] Date of Birth/Sex: 12-13-1940 (76 y.o. M) Treating RN: Montey Hora Primary Care Willard Madrigal: Cyndi Bender Other Clinician: Referring Akin Yi: Cyndi Bender Treating Karlyn Glasco/Extender: Tito Dine in Treatment: 7 Vital Signs Time Taken: 08:57 Temperature (F): 97.6 Height (in): 68 Pulse (bpm): 64 Respiratory Rate (breaths/min): 16 Blood Pressure (mmHg): 91/46 Reference Range: 80 - 120 mg / dl Electronic Signature(s) Signed: 05/27/2017 4:55:05 PM By: Montey Hora Entered By: Montey Hora on 05/27/2017 08:57:51

## 2017-06-01 DIAGNOSIS — I2510 Atherosclerotic heart disease of native coronary artery without angina pectoris: Secondary | ICD-10-CM | POA: Diagnosis not present

## 2017-06-01 DIAGNOSIS — I5023 Acute on chronic systolic (congestive) heart failure: Secondary | ICD-10-CM | POA: Diagnosis not present

## 2017-06-01 DIAGNOSIS — I255 Ischemic cardiomyopathy: Secondary | ICD-10-CM | POA: Diagnosis not present

## 2017-06-01 DIAGNOSIS — S81811D Laceration without foreign body, right lower leg, subsequent encounter: Secondary | ICD-10-CM | POA: Diagnosis not present

## 2017-06-01 DIAGNOSIS — I110 Hypertensive heart disease with heart failure: Secondary | ICD-10-CM | POA: Diagnosis not present

## 2017-06-01 DIAGNOSIS — M069 Rheumatoid arthritis, unspecified: Secondary | ICD-10-CM | POA: Diagnosis not present

## 2017-06-02 DIAGNOSIS — E038 Other specified hypothyroidism: Secondary | ICD-10-CM | POA: Diagnosis not present

## 2017-06-02 DIAGNOSIS — I509 Heart failure, unspecified: Secondary | ICD-10-CM | POA: Diagnosis not present

## 2017-06-02 DIAGNOSIS — J90 Pleural effusion, not elsewhere classified: Secondary | ICD-10-CM | POA: Diagnosis not present

## 2017-06-02 DIAGNOSIS — Z79899 Other long term (current) drug therapy: Secondary | ICD-10-CM | POA: Diagnosis not present

## 2017-06-02 DIAGNOSIS — I10 Essential (primary) hypertension: Secondary | ICD-10-CM | POA: Diagnosis not present

## 2017-06-03 ENCOUNTER — Encounter: Payer: Medicare Other | Admitting: Internal Medicine

## 2017-06-03 ENCOUNTER — Other Ambulatory Visit
Admission: RE | Admit: 2017-06-03 | Discharge: 2017-06-03 | Disposition: A | Discharge status: Home / Self Care | Payer: Medicare Other | Source: Ambulatory Visit | Attending: Internal Medicine | Admitting: Internal Medicine

## 2017-06-03 DIAGNOSIS — L97312 Non-pressure chronic ulcer of right ankle with fat layer exposed: Secondary | ICD-10-CM | POA: Diagnosis not present

## 2017-06-03 DIAGNOSIS — H43813 Vitreous degeneration, bilateral: Secondary | ICD-10-CM | POA: Diagnosis not present

## 2017-06-03 DIAGNOSIS — L97515 Non-pressure chronic ulcer of other part of right foot with muscle involvement without evidence of necrosis: Secondary | ICD-10-CM | POA: Diagnosis not present

## 2017-06-03 DIAGNOSIS — B999 Unspecified infectious disease: Secondary | ICD-10-CM | POA: Diagnosis not present

## 2017-06-03 DIAGNOSIS — I70233 Atherosclerosis of native arteries of right leg with ulceration of ankle: Secondary | ICD-10-CM | POA: Diagnosis not present

## 2017-06-03 DIAGNOSIS — I472 Ventricular tachycardia: Secondary | ICD-10-CM | POA: Diagnosis not present

## 2017-06-03 DIAGNOSIS — L97521 Non-pressure chronic ulcer of other part of left foot limited to breakdown of skin: Secondary | ICD-10-CM | POA: Diagnosis not present

## 2017-06-03 DIAGNOSIS — L97519 Non-pressure chronic ulcer of other part of right foot with unspecified severity: Secondary | ICD-10-CM | POA: Diagnosis not present

## 2017-06-03 DIAGNOSIS — L97512 Non-pressure chronic ulcer of other part of right foot with fat layer exposed: Secondary | ICD-10-CM | POA: Diagnosis not present

## 2017-06-03 DIAGNOSIS — L97211 Non-pressure chronic ulcer of right calf limited to breakdown of skin: Secondary | ICD-10-CM | POA: Diagnosis not present

## 2017-06-03 DIAGNOSIS — I70235 Atherosclerosis of native arteries of right leg with ulceration of other part of foot: Secondary | ICD-10-CM | POA: Diagnosis not present

## 2017-06-03 DIAGNOSIS — I255 Ischemic cardiomyopathy: Secondary | ICD-10-CM | POA: Diagnosis not present

## 2017-06-04 ENCOUNTER — Ambulatory Visit (INDEPENDENT_AMBULATORY_CARE_PROVIDER_SITE_OTHER): Payer: Medicare Other | Admitting: Vascular Surgery

## 2017-06-04 ENCOUNTER — Ambulatory Visit (INDEPENDENT_AMBULATORY_CARE_PROVIDER_SITE_OTHER): Payer: Medicare Other

## 2017-06-04 ENCOUNTER — Encounter (INDEPENDENT_AMBULATORY_CARE_PROVIDER_SITE_OTHER): Payer: Self-pay | Admitting: Vascular Surgery

## 2017-06-04 VITALS — BP 92/56 | HR 67 | Resp 16 | Ht 68.0 in | Wt 172.0 lb

## 2017-06-04 DIAGNOSIS — L97912 Non-pressure chronic ulcer of unspecified part of right lower leg with fat layer exposed: Secondary | ICD-10-CM | POA: Diagnosis not present

## 2017-06-04 DIAGNOSIS — I890 Lymphedema, not elsewhere classified: Secondary | ICD-10-CM | POA: Diagnosis not present

## 2017-06-04 DIAGNOSIS — R600 Localized edema: Secondary | ICD-10-CM

## 2017-06-04 DIAGNOSIS — I872 Venous insufficiency (chronic) (peripheral): Secondary | ICD-10-CM | POA: Diagnosis not present

## 2017-06-04 NOTE — Progress Notes (Signed)
Subjective:    Patient ID: [name], male    DOB: [birth year], 77 y.o.   MRN: [id number] Chief Complaint  Patient presents with  . Follow-up    pt having bil leg weakness   Patient last seen on April 01, 2017 for evaluation of lower extremity pain, weakness, edema and ulceration.  The patient presents today with an improvement to the bilateral calf ulcerations however continues to struggle with a chronic wound to the right ankle.  The patient's main complaint is a progressive weakness to the bilateral lower extremity.  Patient seen with wife.  Patient has been receiving wound treatment from the [facility] wound center.  The patient notes that this wound has been present for over 6 months and has not shown any signs of healing.  The patient underwent an ABI on May 05, 2017 which was notable for a right triphasic popliteal / biphasic tibial arteries with left triphasic popliteal, biphasic anterior tibial and triphasic posterior tibial arteries.  The patient underwent a bilateral lower extremity venous reflux study which was notable for a harvested right greater saphenous vein no reflux noted to the right small saphenous or right deep system.  Left lower extremity with normal reflux times detected in the deep system and great saphenous vein until the proximal calf which then transitions to abnormal reflux.  Chronic wall thickening with intraluminal calcifications noted in the left small saphenous vein.  No evidence of deep vein or superficial thrombophlebitis in the bilateral lower extremity.   Review of Systems  Constitutional: Negative.   HENT: Negative.   Eyes: Negative.   Respiratory: Negative.   Cardiovascular: Positive for leg swelling.  Gastrointestinal: Negative.   Endocrine: Negative.   Genitourinary: Negative.   Musculoskeletal: Negative.   Skin: Positive for wound.  Allergic/Immunologic: Negative.   Neurological: Negative.   Hematological: Negative.   Psychiatric/Behavioral:  Negative.       Objective:   Physical Exam  Constitutional: He is oriented to person, place, and time. He appears well-developed and well-nourished. No distress.  HENT:  Head: Normocephalic and atraumatic.  Right Ear: External ear normal.  Left Ear: External ear normal.  Eyes: Pupils are equal, round, and reactive to light. Conjunctivae and EOM are normal.  Neck: Normal range of motion.  Cardiovascular: Normal rate, regular rhythm, normal heart sounds and intact distal pulses.  Pulses:      Radial pulses are 2+ on the right side, and 2+ on the left side.  Hard to palpate pedal pulses due to body habitus and edema  Pulmonary/Chest: Effort normal and breath sounds normal.  Musculoskeletal: Normal range of motion. He exhibits edema (Right lower extremity with moderate edema.  Left lower extremity with mild edema.).  Neurological: He is alert and oriented to person, place, and time.  Skin: He is not diaphoretic.  Right ankle ulceration.  Covered with wound center dressing.  Psychiatric: He has a normal mood and affect. His behavior is normal. Judgment and thought content normal.  Vitals reviewed.  BP (!) 92/56 (BP Location: Right Arm)   Pulse 67   Resp 16   Ht 5\' 8"  (1.727 m)   Wt 172 lb (78 kg)   BMI 26.15 kg/m   Past Medical History:  Diagnosis Date  . AICD (automatic cardioverter/defibrillator) present    [employer]  . Arthritis    RA  . Balance problem   . CHF (congestive heart failure) ([place])   . Collagen vascular disease ([place])    RA since  2014  . Coronary artery disease   . Deviated nasal septum   . Dysrhythmia   . Hearing loss    TINNITUS  . High cholesterol   . Leg weakness   . Myocardial infarction ([place])   . Presence of permanent cardiac pacemaker    WITH DEFIBRILATOR ([employer])  . Seasonal allergies    RHINITIS  . Shortness of breath dyspnea    COUGH  . Sinus headache   . Sinusitis    CHRONIC  . Sleep apnea   . Vertigo     DYSEQUILIBRIUM   Social History   Socioeconomic History  . Marital status: Married    Spouse name: [name]  . Number of children: 3  . Years of education: 64  . Highest education level: Not on file  Occupational History    Comment: Retired  Scientific laboratory technician  . Financial resource strain: Not on file  . Food insecurity:    Worry: Not on file    Inability: Not on file  . Transportation needs:    Medical: Not on file    Non-medical: Not on file  Tobacco Use  . Smoking status: Never Smoker  . Smokeless tobacco: Never Used  Substance and Sexual Activity  . Alcohol use: No    Alcohol/week: 0.0 oz  . Drug use: No  . Sexual activity: Not on file  Lifestyle  . Physical activity:    Days per week: Not on file    Minutes per session: Not on file  . Stress: Not on file  Relationships  . Social connections:    Talks on phone: Not on file    Gets together: Not on file    Attends religious service: Not on file    Active member of club or organization: Not on file    Attends meetings of clubs or organizations: Not on file    Relationship status: Not on file  . Intimate partner violence:    Fear of current or ex partner: Not on file    Emotionally abused: Not on file    Physically abused: Not on file    Forced sexual activity: Not on file  Other Topics Concern  . Not on file  Social History Narrative   Patient lives at home with his wife [name]).   Retired.   Education 12th grade.   Right handed.   Caffeine one cup daily.   Past Surgical History:  Procedure Laterality Date  . CARDIAC SURGERY    . CATARACT EXTRACTION     BILATERAL  . CORONARY ANGIOPLASTY     STENTS  . CORONARY ARTERY BYPASS GRAFT  1992  . heart pump    . INSERT / REPLACE / REMOVE PACEMAKER    . SEPTOPLASTY     Family History  Problem Relation Age of Onset  . Alzheimer's disease Mother    Allergies  Allergen Reactions  . Pravastatin Other (See Comments)    Reaction:  Joint pain       Assessment &  Plan:  Patient last seen on April 01, 2017 for evaluation of lower extremity pain, weakness, edema and ulceration.  The patient presents today with an improvement to the bilateral calf ulcerations however continues to struggle with a chronic wound to the right ankle.  The patient's main complaint is a progressive weakness to the bilateral lower extremity.  Patient seen with wife.  Patient has been receiving wound treatment from the [facility] wound center.  The patient notes that this wound  has been present for over 6 months and has not shown any signs of healing.  The patient underwent an ABI on May 05, 2017 which was notable for a right triphasic popliteal / biphasic tibial arteries with left triphasic popliteal, biphasic anterior tibial and triphasic posterior tibial arteries.  The patient underwent a bilateral lower extremity venous reflux study which was notable for a harvested right greater saphenous vein no reflux noted to the right small saphenous or right deep system.  Left lower extremity with normal reflux times detected in the deep system and great saphenous vein until the proximal calf which then transitions to abnormal reflux.  Chronic wall thickening with intraluminal calcifications noted in the left small saphenous vein.  No evidence of deep vein or superficial thrombophlebitis in the bilateral lower extremity.   1. Chronic venous insufficiency - New Patient found to have chronic venous insufficiency starting at the proximal calf of the left lower extremity. The patient would be a candidate for endovenous laser ablation to the left great saphenous vein if he would like to pursue this in the future No venous reflux noted to the right lower extremity At this time, the patient is not interested in moving forward with any type of endovenous laser ablation.  The patient changes his mind he is to call the office and will be happy to apply to his insurance for this procedure. In the meantime the patient  to continue engaging conservative therapy  2. Lymphedema - New Despite conservative treatments including exercise, elevation 3 layer zinc oxide Unna wraps, class I compression socks the patient still presents with stage III lymphedema The patient would also be a candidate for a lymphedema pump as a added therapy to help control the edema to the right lower extremity and possibly aid in wound healing At this time, the patient is not interested in moving forward with applying for a lymphedema pump.  The patient changes his mind he is to call the office and will be happy to apply to his insurance. In the meantime the patient to continue engaging conservative therapy  3. Ulcer of right lower extremity with fat layer exposed ([place]) - Stable The patient has a slow healing ulceration to the right ankle for over 6 months despite local wound care provided by the wound center Recent ABI with relatively normal arterial perfusion however would recommend undergoing a diagnostic angiogram in an attempt to assess the patient's anatomy and any contributing peripheral artery disease that may be identified through the angiogram which could be corrected at that time if appropriate. The patient notes that the wound is now down to the bone. Procedure, risks and benefits explained to the patient All questions answered. The patient and his wife would like time to think about this.  If the patient decides to move forward I will place him on Dr. Ozella Almond schedule for diagnostic angiogram  Current Outpatient Medications on File Prior to Visit  Medication Sig Dispense Refill  . acetaminophen (TYLENOL) 325 MG tablet Take 2 tablets (650 mg total) by mouth every 6 (six) hours as needed for mild pain (or Fever >/= 101).    [name] amiodarone (PACERONE) 400 MG tablet Take 300 mg by mouth daily.     [name] aspirin EC 81 MG tablet Take 81 mg by mouth daily.    . cetirizine (ZYRTEC) 10 MG tablet Take 10 mg by mouth daily.    . Cholecalciferol  (VITAMIN D3) 2000 UNITS TABS Take 2,000 Units by mouth daily.     [name]  docusate sodium (COLACE) 100 MG capsule Take 100 mg by mouth every evening.    . fluticasone (FLONASE) 50 MCG/ACT nasal spray Place 1 spray into both nostrils daily as needed for rhinitis.     . furosemide (LASIX) 40 MG tablet Take 40 mg by mouth as needed for edema.     [name] leflunomide (ARAVA) 20 MG tablet Take 20 mg by mouth every evening.    [name] levothyroxine (SYNTHROID, LEVOTHROID) 50 MCG tablet Take 1 tablet by mouth every morning.     . metoprolol succinate (TOPROL-XL) 25 MG 24 hr tablet Take 25 mg by mouth 2 (two) times daily.     [name] mexiletine (MEXITIL) 150 MG capsule Take 150 mg by mouth 3 (three) times daily.     . nitroGLYCERIN (NITROSTAT) 0.4 MG SL tablet Place 0.4 mg under the tongue every 5 (five) minutes as needed for chest pain.    . Polyethylene Glycol 3350 (PEG 3350) POWD Take by mouth.    . potassium chloride (K-DUR) 10 MEQ tablet Take by mouth.    . vitamin B-12 (CYANOCOBALAMIN) 1000 MCG tablet Take 1,000 mcg by mouth daily.    [name] nystatin (MYCOSTATIN) 100000 UNIT/ML suspension Take 5 mLs (500,000 Units total) by mouth 4 (four) times daily. (Patient not taking: Reported on 04/01/2017) 60 mL 0  . predniSONE (DELTASONE) 20 MG tablet Take 2.5 tablets (50 mg total) by mouth daily with breakfast. (Patient not taking: Reported on 04/01/2017)    . traMADol (ULTRAM) 50 MG tablet Take 1 tablet by mouth 3 (three) times daily as needed.     No current facility-administered medications on file prior to visit.    There are no Patient Instructions on file for this visit. No follow-ups on file.  [name], PA-C

## 2017-06-05 DIAGNOSIS — S81811D Laceration without foreign body, right lower leg, subsequent encounter: Secondary | ICD-10-CM | POA: Diagnosis not present

## 2017-06-05 DIAGNOSIS — I2510 Atherosclerotic heart disease of native coronary artery without angina pectoris: Secondary | ICD-10-CM | POA: Diagnosis not present

## 2017-06-05 DIAGNOSIS — I255 Ischemic cardiomyopathy: Secondary | ICD-10-CM | POA: Diagnosis not present

## 2017-06-05 DIAGNOSIS — I5023 Acute on chronic systolic (congestive) heart failure: Secondary | ICD-10-CM | POA: Diagnosis not present

## 2017-06-05 DIAGNOSIS — M069 Rheumatoid arthritis, unspecified: Secondary | ICD-10-CM | POA: Diagnosis not present

## 2017-06-05 DIAGNOSIS — I110 Hypertensive heart disease with heart failure: Secondary | ICD-10-CM | POA: Diagnosis not present

## 2017-06-05 NOTE — Progress Notes (Signed)
[name] ([id number]) Visit Report for 06/03/2017 Debridement Details Patient Name: [name], [name] Date of Service: 06/03/2017 11:15 AM Medical Record Number: [id number] Patient Account Number: [id number] Date of Birth/Sex: [birth year] (76 y.o. M) Treating RN: Cornell Barman Primary Care Provider: Cyndi Bender Other Clinician: Referring Provider: Cyndi Bender Treating Provider/Extender: Tito Dine in Treatment: 8 Debridement Performed for Wound #6 Right,Lateral Malleolus Assessment: Performed By: Physician Ricard Dillon, MD Debridement Type: Debridement Severity of Tissue Pre Fat layer exposed Debridement: Pre-procedure Verification/Time Yes - 11:55 Out Taken: Start Time: 11:56 Pain Control: Other : lidocaine 4% Total Area Debrided (L x W): 0.7 (cm) x 0.6 (cm) = 0.42 (cm) Tissue and other material Viable, Non-Viable, Subcutaneous debrided: Level: Skin/Subcutaneous Tissue Debridement Description: Excisional Instrument: Curette Bleeding: Moderate Hemostasis Achieved: Pressure End Time: 11:59 Procedural Pain: 1 Post Procedural Pain: 1 Response to Treatment: Procedure was tolerated well Level of Consciousness: Awake and Alert Post Procedure Vitals: Temperature: 97.6 Pulse: 63 Respiratory Rate: 16 Blood Pressure: Systolic Blood Pressure: 233 Diastolic Blood Pressure: 59 Post Debridement Measurements of Total Wound Length: (cm) 0.7 Width: (cm) 0.6 Depth: (cm) 0.3 Volume: (cm) 0.099 Character of Wound/Ulcer Post Debridement: Stable Severity of Tissue Post Debridement: Fat layer exposed Post Procedure Diagnosis Same as Pre-procedure [name] ([id number]) Electronic Signature(s) Signed: 06/03/2017 5:21:30 PM By: Gretta Cool, BSN, RN, CWS, Kim RN, BSN Signed: 06/04/2017 8:24:56 AM By: Linton Ham MD Entered By: Linton Ham on 06/03/2017 12:36:28 [name]  ([id number]) -------------------------------------------------------------------------------- Debridement Details Patient Name: [name] Date of Service: 06/03/2017 11:15 AM Medical Record Number: [id number] Patient Account Number: [id number] Date of Birth/Sex: October 13, 1940 (76 y.o. M) Treating RN: Cornell Barman Primary Care Provider: Cyndi Bender Other Clinician: Referring Provider: Cyndi Bender Treating Provider/Extender: Tito Dine in Treatment: 8 Debridement Performed for Wound #8 Right,Lateral [name] Assessment: Performed By: Physician Ricard Dillon, MD Debridement Type: Debridement Severity of Tissue Pre Necrosis of muscle Debridement: Pre-procedure Verification/Time Yes - 11:55 Out Taken: Start Time: 11:56 Pain Control: Other : lidocaine 4% Total Area Debrided (L x W): 1 (cm) x 1.2 (cm) = 1.2 (cm) Tissue and other material Viable, Non-Viable, Muscle, Subcutaneous debrided: Level: Skin/Subcutaneous Tissue/Muscle Debridement Description: Excisional Instrument: Curette Specimen: Swab, Number of Specimens Taken: 1 Bleeding: Moderate Hemostasis Achieved: Pressure End Time: 11:59 Procedural Pain: 3 Post Procedural Pain: 1 Response to Treatment: Procedure was tolerated well Level of Consciousness: Awake and Alert Post Procedure Vitals: Temperature: 97.6 Pulse: 63 Respiratory Rate: 16 Blood Pressure: Systolic Blood Pressure: 681 Diastolic Blood Pressure: 59 Post Debridement Measurements of Total Wound Length: (cm) 1 Width: (cm) 1.2 Depth: (cm) 0.5 Volume: (cm) 0.471 Character of Wound/Ulcer Post Debridement: Stable Severity of Tissue Post Debridement: Fat layer exposed Post Procedure Diagnosis Same as Pre-procedure Electronic Signature(s) [name] ([id number]) Signed: 06/03/2017 5:21:30 PM By: Gretta Cool, BSN, RN, CWS, Kim RN, BSN Signed: 06/04/2017 8:24:56 AM By: Linton Ham MD Entered By: Linton Ham on 06/03/2017  12:36:41 [name] ([id number]) -------------------------------------------------------------------------------- HPI Details Patient Name: [name] Date of Service: 06/03/2017 11:15 AM Medical Record Number: [id number] Patient Account Number: [id number] Date of Birth/Sex: [birth year] (76 y.o. M) Treating RN: Cornell Barman Primary Care Provider: Cyndi Bender Other Clinician: Referring Provider: Cyndi Bender Treating Provider/Extender: Tito Dine in Treatment: 8 History of Present Illness HPI Description: 04/08/17; this is a complex 77 year old man referred here from [facility]. He had been referred there for bilateral lower extremity edema with ulcer formation predominantly  on the right calf but also the right [name]. He had been receiving Unna boots bilaterally. The history here is long. He is not a diabetic however ICU looking through late 2018 he was worked up for chronic headaches, elevated inflammatory markers including C-reactive protein and ESR. He went on to actually have a left temporal artery biopsy wasn't that was negative.he received about 6 weeks of high-dose prednisone 60 mg with improvement in his inflammatory markers. He was admitted to hospital in late November with ventricular tachycardia syncope. He has known ischemic cardiomyopathy. He was admitted in the hospital in mid December. Apparently this was precipitated by a syncopal spell falling out of his scooter while at [place]. There was ventricular arrhythmia. He has an implantable defibrillator and echocardiogram showed severe LV dysfunction with an EF of 20% and valvular regurgitations including mild AR, moderate MR. There was no stenosis. He ruled in for a non-ST elevation MI in the setting of V. tach.his wife states that sometime during this hospitalization she noted multiple areas of skin change on the right lower calf which became evident just after he left the hospital. He  was back in hospital in February with acute renal failure hyponatremia. This responded to fluid resuscitation.. Interestingly I can't see much description of his right leg at that point in time.he was followed by Dr. Nehemiah Massed of dermatology for the necrotic wounds on his right leg. Apparently a biopsy was planned at one point but not done although in some notes that suggests it was. I cannot see these results area He was noted to have a lot of edema. Was treated with bilateral Unna boots edges really helped with the swelling they have been using Bactroban to small open areas predominantly on the right anterior lower leg His history is complicated by the fact that he has rheumatoid arthritis followed by rheumatology. He is followed by neurology for disabling headaches. At one point this was felt to be giant cell arteritis although a left temporal biopsy was apparently negative. He was given a prolonged course of prednisone at 60 mg which managed his sedimentation rates but apparently did not prove improve the headaches. This is been tapered to off on by rheumatology on 03/13/17 Vascular had plans to do a venous reflux workup as well as arterial studies in May. They also wanted to get him a lymphedema pump. As mentioned he's been using bacitracin under Unna boot wraps to both lower legs 04/15/17; the patient arrives with most of his wounds improved. These are small punched out wounds. Most of them remaining ones are on the right anterior calf with the most problematic over the right lateral malleolus. There are no new areas. The symptom complex or potential symptom complex we are dealing with his chronic disabling headaches with inflammatory markers not responsive to prednisone and with a negative temporal biopsy, lower extremity weakness, skin ulcerations just on the right leg. We have managed to get his arterial studies moved up to April 30. He has a rheumatology consult at [facility] in June. He has seen  dermatology locally, rheumatology locally, neurology locally. 04/22/17; small punched out areas on the right leg anteriorly posteriorly. Most of these appear to have closed over. Some of them have eschar over the surface. The most problematic area appears to be over the right lateral malleolus. We've been using prisma to all of this. He has arterial studies on April 30 and a rheumatology consult at [facility] on June 28 04/29/17; most of the small punched out  areas on the right leg posteriorly are closed. He has 2 or 3 openings anteriorly but most of these appear to be on the way to closing. Still problematically over the right lateral malleolus and right lateral [name] with almost ischemic-looking eschar. His arterial studies that I ordered are due to be done next week on the 30th so we should have them available for our next visit hopefully. He also saw a rheumatologist at [facility] and according to the patient he did 8 vials of blood. Finally he has scaling rash on his left [name] and what looks to be at [facility] area on the left anterior leg 05/06/17; most of the small punched out areas on the right leg posteriorly and anteriorly are closed. He still has one small one anteriorly one over the right lateral malleolus and one over the right lateral [name]. NORRIS, BRUMBACH ([id number]) He had his arterial studies they didn't seem to do waveform analysis not exactly sure why however in any case is ABIs were noncompressible bilaterally. They did provide TBIs although looking at the pressures it appears that his TBIs are quite normal. I therefore went ahead and debrided the area over the right lateral malleolus and the right lateral [name] 05/13/17; most of the small punched out areas on the right leg posteriorly and anteriorly are closed. He continues to have problematic areas over the right lateral malleolus and the right lateral [name]. He still requiring aggressive debridement of these 2 wounds using silver collagen I  reviewed the note from rheumatology I don't think they came up with a specific diagnosis although he is known to have seropositive RA. They did a panel of lab work when I was able to see his his AMA was negative, anti-smooth muscle antibodies negative antineutrophil cytoplasmic antibodies negative,Liv/kia type 1 negative. Serum C3 and C4 were negative. I don't see his muscle enzymes specifically. He was referred back to his local rheumatologist for management of his known rheumatoid arthritis.the patient states he still feels weak and fatigued. He states he has numbness in both feet and apparently is known to have neuropathy 05/20/17; the patient continues to have a difficult problem on the right lateral malleolus and the right lateral [name]. Both of these wounds have no viable surface even with attempts at debridement. He has a new wound on the left plantar metatarsal head which looks more like a superficial diabetic pressure related injury then part of this underlying issue he has. Most of the rest of the wounds on his legs look satisfactory. Mostly on the right calf.reviewed his arterial studies which showed noncompressible vessels bilaterally but the TBIs were quite normal. 05/27/17; no real improvement in the right lateral malleolus and right lateral [name]. In fact the right lateral [name] is now on bone. I gave him doxycycline empirically last week it appears that he developed photosensitivity was 43 the son in a tractor. I'll not give him any more of this. This is predominantly on his face and dorsal forearms and hands. I given this more as an anti- inflammatory. I'm going to have him seen by vascular surgery. His TBIs that he had done previously ordered by Dr. Brigitte Pulse were in the normal range They never did full arterial studies on him. he now has small punched out wounds on the right lateral ankle and right lateral [name]. I doubt these are ischemic however I would like a review of his macrovascular  status. He does describe some pain at night. I'll reduce the compression from 3  layer to 2 layer and I'm not convinced that this is a macrovascular issue however I want to make sure. We've been using silver alginate 06/03/17; the patient's x-rays that I ordered last time of his right ankle and right lateral [name] did not show definite osteomyelitis. We've been using silver alginate. The wounds are not making any progress. The area on the plantar left first metatarsal head however appears to be better. The patient complains of weakness that is more of the fatigue. He says if he's walking his head will fall onto his chest and that his legs literally gave out on him. He did see neurology in the past however that was at a time where his workup was for temporal arteritis and headaches. Electronic Signature(s) Signed: 06/04/2017 8:24:56 AM By: Linton Ham MD Entered By: Linton Ham on 06/03/2017 12:38:27 Stroupe, [name] ([id number]) -------------------------------------------------------------------------------- Physical Exam Details Patient Name: [name] Date of Service: 06/03/2017 11:15 AM Medical Record Number: [id number] Patient Account Number: [id number] Date of Birth/Sex: 02/14/1940 (76 y.o. M) Treating RN: Cornell Barman Primary Care Provider: Cyndi Bender Other Clinician: Referring Provider: Cyndi Bender Treating Provider/Extender: Tito Dine in Treatment: 8 Constitutional Sitting or standing Blood Pressure is within target range for patient.. Pulse regular and within target range for patient.[name] Respirations regular, non-labored and within target range.. Temperature is normal and within the target range for the patient.[name] appears in no distress. Cardiovascular Pedal pulses absent bilaterally.. Notes wound exam; multiple areas on the posterior and anterior part of his calf remained healed. The cause of this was never really cleared. He had lesser numbers of  these wounds on the left including the left [name] these are also closed ohe has punched out areas on the right lateral malleolus and right lateral [name]. These have very tightly adherent necrotic surfaces which required debridement each time he comes in. Unfortunately the area on the right lateral [name] appears to probe to bone. oThe superficial area over the left first metatarsal head appears better Electronic Signature(s) Signed: 06/04/2017 8:24:56 AM By: Linton Ham MD Entered By: Linton Ham on 06/03/2017 12:40:27 [place], [name] ([id number]) -------------------------------------------------------------------------------- Physician Orders Details Patient Name: [name] Date of Service: 06/03/2017 11:15 AM Medical Record Number: [id number] Patient Account Number: [id number] Date of Birth/Sex: 08-22-1940 (76 y.o. M) Treating RN: Cornell Barman Primary Care Provider: Cyndi Bender Other Clinician: Referring Provider: Cyndi Bender Treating Provider/Extender: Tito Dine in Treatment: 8 Verbal / Phone Orders: No Diagnosis Coding Wound Cleansing Wound #6 Right,Lateral Malleolus o Clean wound with Normal Saline. Wound #8 Right,Lateral [name] o Clean wound with Normal Saline. Wound #9 Left Metatarsal head first o Clean wound with Normal Saline. Anesthetic (add to Medication List) Wound #6 Right,Lateral Malleolus o Topical Lidocaine 4% cream applied to wound bed prior to debridement (In Clinic Only). Wound #8 Right,Lateral [name] o Topical Lidocaine 4% cream applied to wound bed prior to debridement (In Clinic Only). Wound #9 Left Metatarsal head first o Topical Lidocaine 4% cream applied to wound bed prior to debridement (In Clinic Only). Skin Barriers/Peri-Wound Care Wound #6 Right,Lateral Malleolus o Triamcinolone Acetonide Ointment (TAC) Wound #8 Right,Lateral [name] o Triamcinolone Acetonide Ointment (TAC) Wound #9 Left Metatarsal head  first o Triamcinolone Acetonide Ointment (TAC) Primary Wound Dressing Wound #6 Right,Lateral Malleolus o Santyl Ointment Wound #8 Right,Lateral [name] o Santyl Ointment Wound #9 Left Metatarsal head first o Silver Collagen Secondary Dressing Wound #6 Right,Lateral Malleolus o ABD pad Gordan, Kirby E. ([id number]) Wound #8 Right,Lateral  [name] o ABD pad Wound #9 Left Metatarsal head first o Boardered Foam Dressing Dressing Change Frequency Wound #6 Right,Lateral Malleolus o Change Dressing Monday, Wednesday, Friday Wound #8 Right,Lateral [name] o Change Dressing Monday, Wednesday, Friday Wound #9 Left Metatarsal head first o Change Dressing Monday, Wednesday, Friday Follow-up Appointments Wound #6 Right,Lateral Malleolus o Return Appointment in 1 week. Wound #8 Right,Lateral [name] o Return Appointment in 1 week. Wound #9 Left Metatarsal head first o Return Appointment in 1 week. Edema Control Wound #6 Right,Lateral Malleolus o Kerlix and Coban - Right Lower Extremity Wound #8 Right,Lateral [name] o Kerlix and Coban - Right Lower Extremity Home Health Wound #6 [place] Nurse may visit PRN to address patientos wound care needs. o FACE TO FACE ENCOUNTER: MEDICARE and MEDICAID PATIENTS: I certify that this patient is under my care and that I had a face-to-face encounter that meets the physician face-to-face encounter requirements with this patient on this date. The encounter with the patient was in whole or in part for the following MEDICAL CONDITION: (primary reason for [place]) MEDICAL NECESSITY: I certify, that based on my findings, NURSING services are a medically necessary home health service. HOME BOUND STATUS: I certify that my clinical findings support that this patient is homebound (i.e., Due to illness or injury, pt requires aid of supportive devices such as crutches,  cane, wheelchairs, walkers, the use of special transportation or the assistance of another person to leave their place of residence. There is a normal inability to leave the home and doing so requires considerable and taxing effort. Other absences are for medical reasons / religious services and are infrequent or of short duration when for other reasons). o If current dressing causes regression in wound condition, may D/C ordered dressing product/s and apply Normal Saline Moist Dressing daily until next [place] / Other MD appointment. [place] of regression in wound condition at [phone]. o Please direct any NON-WOUND related issues/requests for orders to patient's Primary Care Physician Wound #8 [place] Nurse may visit PRN to address patientos wound care needs. LEXANDER, TREMBLAY ([id number]) o FACE TO FACE ENCOUNTER: MEDICARE and MEDICAID PATIENTS: I certify that this patient is under my care and that I had a face-to-face encounter that meets the physician face-to-face encounter requirements with this patient on this date. The encounter with the patient was in whole or in part for the following MEDICAL CONDITION: (primary reason for [place]) MEDICAL NECESSITY: I certify, that based on my findings, NURSING services are a medically necessary home health service. HOME BOUND STATUS: I certify that my clinical findings support that this patient is homebound (i.e., Due to illness or injury, pt requires aid of supportive devices such as crutches, cane, wheelchairs, walkers, the use of special transportation or the assistance of another person to leave their place of residence. There is a normal inability to leave the home and doing so requires considerable and taxing effort. Other absences are for medical reasons / religious services and are infrequent or of short duration when for other  reasons). o If current dressing causes regression in wound condition, may D/C ordered dressing product/s and apply Normal Saline Moist Dressing daily until next [place] / Other MD appointment. [place] of regression in wound condition at [phone]. o Please direct any NON-WOUND related issues/requests for orders to patient's Primary Care Physician Wound #  9 Left Metatarsal head first o [place] Nurse may visit PRN to address patientos wound care needs. o FACE TO FACE ENCOUNTER: MEDICARE and MEDICAID PATIENTS: I certify that this patient is under my care and that I had a face-to-face encounter that meets the physician face-to-face encounter requirements with this patient on this date. The encounter with the patient was in whole or in part for the following MEDICAL CONDITION: (primary reason for [place]) MEDICAL NECESSITY: I certify, that based on my findings, NURSING services are a medically necessary home health service. HOME BOUND STATUS: I certify that my clinical findings support that this patient is homebound (i.e., Due to illness or injury, pt requires aid of supportive devices such as crutches, cane, wheelchairs, walkers, the use of special transportation or the assistance of another person to leave their place of residence. There is a normal inability to leave the home and doing so requires considerable and taxing effort. Other absences are for medical reasons / religious services and are infrequent or of short duration when for other reasons). o If current dressing causes regression in wound condition, may D/C ordered dressing product/s and apply Normal Saline Moist Dressing daily until next [place] / Other MD appointment. [place] of regression in wound condition at [phone]. o Please direct any NON-WOUND related issues/requests for orders to patient's Primary  Care Physician Laboratory o Bacteria identified in Wound by Culture (MICRO) - right lateral [name] oooo LOINC Code: 7253-6 UYQI Convenience Name: Wound culture routine Electronic Signature(s) Signed: 06/03/2017 5:21:30 PM By: Gretta Cool, BSN, RN, CWS, Kim RN, BSN Signed: 06/04/2017 8:24:56 AM By: Linton Ham MD Entered By: Gretta Cool, BSN, RN, CWS, Kim on 06/03/2017 12:17:38 Keshishyan, [name] ([id number]) -------------------------------------------------------------------------------- Problem List Details Patient Name: [name] Date of Service: 06/03/2017 11:15 AM Medical Record Number: [id number] Patient Account Number: [id number] Date of Birth/Sex: 1940-03-05 (76 y.o. 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 breakdown 04/08/2017 Yes of skin L97.511 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 limited to 04/08/2017 Yes breakdown of skin I25.5 Ischemic cardiomyopathy 04/08/2017 Yes Inactive Problems Resolved Problems Electronic Signature(s) Signed: 06/04/2017 8:24:56 AM By: Linton Ham MD Entered By: Linton Ham on 06/03/2017 12:32:25 [place], [name] ([id number]) -------------------------------------------------------------------------------- Progress Note Details Patient Name: [name] Date of Service: 06/03/2017 11:15 AM Medical Record Number: [id number] Patient Account Number: [id number] Date of Birth/Sex: April 19, 1940 (76 y.o. M) Treating RN: Cornell Barman Primary Care Provider: Cyndi Bender Other Clinician: Referring Provider: Cyndi Bender Treating  Provider/Extender: Tito Dine in Treatment: 8 Subjective History of Present Illness (HPI) 04/08/17; this is a complex 77 year old man referred here from [facility]. He had been referred there for bilateral lower extremity edema with ulcer formation predominantly on the right calf but also the right [name]. He had been receiving Unna boots bilaterally. The history here is long. He is not a diabetic however ICU looking through late 2018 he was worked up for chronic headaches, elevated inflammatory markers including C-reactive protein and ESR. He went on to actually have a left temporal artery biopsy wasn't that was negative.he received about 6  weeks of high-dose prednisone 60 mg with improvement in his inflammatory markers. He was admitted to hospital in late November with ventricular tachycardia syncope. He has known ischemic cardiomyopathy. He was admitted in the hospital in mid December. Apparently this was precipitated by a syncopal spell falling out of his scooter while at [place]. There was ventricular arrhythmia. He has an implantable defibrillator and echocardiogram showed severe LV dysfunction with an EF of 20% and valvular regurgitations including mild AR, moderate MR. There was no stenosis. He ruled in for a non-ST elevation MI in the setting of V. tach.his wife states that sometime during this hospitalization she noted multiple areas of skin change on the right lower calf which became evident just after he left the hospital. He was back in hospital in February with acute renal failure hyponatremia. This responded to fluid resuscitation.. Interestingly I can't see much description of his right leg at that point in time.he was followed by Dr. Nehemiah Massed of dermatology for the necrotic wounds on his right leg. Apparently a biopsy was planned at one point but not done although in some notes that suggests it was. I cannot see these results area He was noted to have a  lot of edema. Was treated with bilateral Unna boots edges really helped with the swelling they have been using Bactroban to small open areas predominantly on the right anterior lower leg His history is complicated by the fact that he has rheumatoid arthritis followed by rheumatology. He is followed by neurology for disabling headaches. At one point this was felt to be giant cell arteritis although a left temporal biopsy was apparently negative. He was given a prolonged course of prednisone at 60 mg which managed his sedimentation rates but apparently did not prove improve the headaches. This is been tapered to off on by rheumatology on 03/13/17 Vascular had plans to do a venous reflux workup as well as arterial studies in May. They also wanted to get him a lymphedema pump. As mentioned he's been using bacitracin under Unna boot wraps to both lower legs 04/15/17; the patient arrives with most of his wounds improved. These are small punched out wounds. Most of them remaining ones are on the right anterior calf with the most problematic over the right lateral malleolus. There are no new areas. The symptom complex or potential symptom complex we are dealing with his chronic disabling headaches with inflammatory markers not responsive to prednisone and with a negative temporal biopsy, lower extremity weakness, skin ulcerations just on the right leg. We have managed to get his arterial studies moved up to April 30. He has a rheumatology consult at [facility] in June. He has seen dermatology locally, rheumatology locally, neurology locally. 04/22/17; small punched out areas on the right leg anteriorly posteriorly. Most of these appear to have closed over. Some of them have eschar over the surface. The most problematic area appears to be over the right lateral malleolus. We've been using prisma to all of this. He has arterial studies on April 30 and a rheumatology consult at [facility] on June 28 04/29/17; most of the small  punched out areas on the right leg posteriorly are closed. He has 2 or 3 openings anteriorly but most of these appear to be on the way to closing. Still problematically over the right lateral malleolus and right lateral [name] with almost ischemic-looking eschar. His arterial studies that I ordered are due to be done next week on the 30th so we should have them available for  our next visit hopefully. He also saw a rheumatologist at [facility] and according to the patient he did 8 vials of blood. Finally he has scaling rash on his left [name] and what looks to be at [facility] area on the left anterior leg 05/06/17; most of the small punched out areas on the right leg posteriorly and anteriorly are closed. He still has one small one Beckstrand, Dennis E. ([id number]) anteriorly one over the right lateral malleolus and one over the right lateral [name]. He had his arterial studies they didn't seem to do waveform analysis not exactly sure why however in any case is ABIs were noncompressible bilaterally. They did provide TBIs although looking at the pressures it appears that his TBIs are quite normal. I therefore went ahead and debrided the area over the right lateral malleolus and the right lateral [name] 05/13/17; most of the small punched out areas on the right leg posteriorly and anteriorly are closed. He continues to have problematic areas over the right lateral malleolus and the right lateral [name]. He still requiring aggressive debridement of these 2 wounds using silver collagen I reviewed the note from rheumatology I don't think they came up with a specific diagnosis although he is known to have seropositive RA. They did a panel of lab work when I was able to see his his AMA was negative, anti-smooth muscle antibodies negative antineutrophil cytoplasmic antibodies negative,Liv/kia type 1 negative. Serum C3 and C4 were negative. I don't see his muscle enzymes specifically. He was referred back to his local rheumatologist  for management of his known rheumatoid arthritis.the patient states he still feels weak and fatigued. He states he has numbness in both feet and apparently is known to have neuropathy 05/20/17; the patient continues to have a difficult problem on the right lateral malleolus and the right lateral [name]. Both of these wounds have no viable surface even with attempts at debridement. He has a new wound on the left plantar metatarsal head which looks more like a superficial diabetic pressure related injury then part of this underlying issue he has. Most of the rest of the wounds on his legs look satisfactory. Mostly on the right calf.reviewed his arterial studies which showed noncompressible vessels bilaterally but the TBIs were quite normal. 05/27/17; no real improvement in the right lateral malleolus and right lateral [name]. In fact the right lateral [name] is now on bone. I gave him doxycycline empirically last week it appears that he developed photosensitivity was 50 the son in a tractor. I'll not give him any more of this. This is predominantly on his face and dorsal forearms and hands. I given this more as an anti- inflammatory. I'm going to have him seen by vascular surgery. His TBIs that he had done previously ordered by Dr. Brigitte Pulse were in the normal range They never did full arterial studies on him. he now has small punched out wounds on the right lateral ankle and right lateral [name]. I doubt these are ischemic however I would like a review of his macrovascular status. He does describe some pain at night. I'll reduce the compression from 3 layer to 2 layer and I'm not convinced that this is a macrovascular issue however I want to make sure. We've been using silver alginate 06/03/17; the patient's x-rays that I ordered last time of his right ankle and right lateral [name] did not show definite osteomyelitis. We've been using silver alginate. The wounds are not making any progress. The area on the plantar  left  first metatarsal head however appears to be better. The patient complains of weakness that is more of the fatigue. He says if he's walking his head will fall onto his chest and that his legs literally gave out on him. He did see neurology in the past however that was at a time where his workup was for temporal arteritis and headaches. Objective Constitutional Sitting or standing Blood Pressure is within target range for patient.. Pulse regular and within target range for patient.[name] Respirations regular, non-labored and within target range.. Temperature is normal and within the target range for the patient.[name] appears in no distress. Vitals Time Taken: 11:25 AM, Height: 68 in, Temperature: 97.6 F, Pulse: 64 bpm, Respiratory Rate: 18 breaths/min, Blood Pressure: 102/59 mmHg. Cardiovascular Pedal pulses absent bilaterally.[name] NEHEMIAH, MCFARREN ([id number]) General Notes: wound exam; multiple areas on the posterior and anterior part of his calf remained healed. The cause of this was never really cleared. He had lesser numbers of these wounds on the left including the left [name] these are also closed he has punched out areas on the right lateral malleolus and right lateral [name]. These have very tightly adherent necrotic surfaces which required debridement each time he comes in. Unfortunately the area on the right lateral [name] appears to probe to bone. The superficial area over the left first metatarsal head appears better Integumentary (Hair, Skin) Wound #4 status is Open. Original cause of wound was Gradually Appeared. The wound is located on the Right,Proximal,Lateral Lower Leg. The wound measures 0cm length x 0cm width x 0cm depth; 0cm^2 area and 0cm^3 volume. There is a none present amount of drainage noted. The wound margin is flat and intact. There is no granulation within the wound bed. There is a large (67-100%) amount of necrotic tissue within the wound bed including Eschar.  The periwound skin appearance exhibited: Rash. The periwound skin appearance did not exhibit: Callus, Crepitus, Excoriation, Induration, Scarring, Dry/Scaly, Maceration, Atrophie Blanche, Cyanosis, Ecchymosis, Hemosiderin Staining, Mottled, Pallor, Rubor, Erythema. Periwound temperature was noted as No Abnormality. Wound #6 status is Open. Original cause of wound was Gradually Appeared. The wound is located on the Right,Lateral Malleolus. The wound measures 0.7cm length x 0.6cm width x 0.3cm depth; 0.33cm^2 area and 0.099cm^3 volume. There is no tunneling or undermining noted. There is a medium amount of serous drainage noted. The wound margin is distinct with the outline attached to the wound base. There is no granulation within the wound bed. There is a large (67-100%) amount of necrotic tissue within the wound bed including Eschar and Adherent Slough. The periwound skin appearance did not exhibit: Callus, Crepitus, Excoriation, Induration, Rash, Scarring, Dry/Scaly, Maceration, Atrophie Blanche, Cyanosis, Ecchymosis, Hemosiderin Staining, Mottled, Pallor, Rubor, Erythema. Periwound temperature was noted as No Abnormality. The periwound has tenderness on palpation. Wound #8 status is Open. Original cause of wound was Gradually Appeared. The wound is located on the Right,Lateral [name]. The wound measures 1cm length x 1.2cm width x 0.4cm depth; 0.942cm^2 area and 0.377cm^3 volume. Wound #9 status is Open. Original cause of wound was Gradually Appeared. The wound is located on the Left Metatarsal head first. The wound measures 1cm length x 0.9cm width x 0.1cm depth; 0.707cm^2 area and 0.071cm^3 volume. There is Fat Layer (Subcutaneous Tissue) Exposed exposed. There is no tunneling or undermining noted. There is a medium amount of serous drainage noted. The wound margin is flat and intact. There is medium (34-66%) pink granulation within the wound bed. There is a medium (34-66%) amount of  necrotic  tissue within the wound bed including Adherent Slough. The periwound skin appearance did not exhibit: Callus, Crepitus, Excoriation, Induration, Rash, Scarring, Dry/Scaly, Maceration, Atrophie Blanche, Cyanosis, Ecchymosis, Hemosiderin Staining, Mottled, Pallor, Rubor, Erythema. Periwound temperature was noted as No Abnormality. 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 ([id number]) Tissue Pre Debridement is: Fat layer exposed. There was a Excisional Skin/Subcutaneous Tissue Debridement with a total area of 0.42 sq cm performed by Ricard Dillon, MD. With the following instrument(s): Curette to remove Viable and Non-Viable tissue/material. Material removed includes Subcutaneous Tissue after achieving pain control using Other (lidocaine 4%). No specimens were taken. A time out was conducted at 11:55, prior to the start of the procedure. A Moderate amount of bleeding was controlled with Pressure. The procedure was tolerated well with a pain level of 1 throughout and a pain level of 1 following the procedure. Patient s Level of Consciousness post procedure was recorded as Awake and Alert. Post Debridement Measurements: 0.7cm length x 0.6cm width x 0.3cm depth; 0.099cm^3 volume. Character of Wound/Ulcer Post Debridement is stable. Severity of Tissue Post Debridement is: Fat layer exposed. Post procedure Diagnosis Wound #6: Same as Pre-Procedure Wound #8 Pre-procedure diagnosis of Wound #8 is an Arterial Insufficiency Ulcer located on the  Right,Lateral [name] .Severity of Tissue Pre Debridement is: Necrosis of muscle. There was a Excisional Skin/Subcutaneous Tissue/Muscle Debridement with a total area of 1.2 sq cm performed by Ricard Dillon, MD. With the following instrument(s): Curette to remove Viable and Non-Viable tissue/material. Material removed includes Muscle and Subcutaneous Tissue and after achieving pain control using Other (lidocaine 4%). 1 specimen was taken by a Swab and sent to the lab per facility protocol. A time out was conducted at 11:55, prior to the start of the procedure. A Moderate amount of bleeding was controlled with Pressure. The procedure was tolerated well with a pain level of 3 throughout and a pain level of 1 following the procedure. Patient s Level of Consciousness post procedure was recorded as Awake and Alert. Post Debridement Measurements: 1cm length x 1.2cm width x 0.5cm depth; 0.471cm^3 volume. Character of Wound/Ulcer Post Debridement is stable. Severity of Tissue Post Debridement is: Fat layer exposed. Post procedure Diagnosis Wound #8: Same as Pre-Procedure Plan Wound Cleansing: Wound #6 Right,Lateral Malleolus: Clean wound with Normal Saline. Wound #8 Right,Lateral [name]: Clean wound with Normal Saline. Wound #9 Left Metatarsal head first: Clean wound with Normal Saline. Anesthetic (add to Medication List): Wound #6 Right,Lateral Malleolus: Topical Lidocaine 4% cream applied to wound bed prior to debridement (In Clinic Only). Wound #8 Right,Lateral [name]: Topical Lidocaine 4% cream applied to wound bed prior to debridement (In Clinic Only). Wound #9 Left Metatarsal head first: Topical Lidocaine 4% cream applied to wound bed prior to debridement (In Clinic Only). Skin Barriers/Peri-Wound Care: Wound #6 Right,Lateral Malleolus: Triamcinolone Acetonide Ointment (TAC) Wound #8 Right,Lateral [name]: Triamcinolone Acetonide Ointment (TAC) Wound #9 Left Metatarsal head  first: Triamcinolone Acetonide Ointment (TAC) Primary Wound Dressing: Wound #6 Right,Lateral Malleolus: Santyl Ointment Wound #8 Right,Lateral [name]: Santyl Ointment Wound #9 Left Metatarsal head first: Silver Collagen Riemann,  Markeem EMarland Kitchen ([id number]) Secondary Dressing: Wound #6 Right,Lateral Malleolus: ABD pad Wound #8 Right,Lateral [name]: ABD pad Wound #9 Left Metatarsal head first: Boardered Foam Dressing Dressing Change Frequency: Wound #6 Right,Lateral Malleolus: Change Dressing Monday, Wednesday, Friday Wound #8 Right,Lateral [name]: Change Dressing Monday, Wednesday, Friday Wound #9 Left Metatarsal head first: Change Dressing Monday, Wednesday, Friday Follow-up Appointments: Wound #6 Right,Lateral Malleolus: Return Appointment in 1 week. Wound #8 Right,Lateral [name]: Return Appointment in 1 week. Wound #9 Left Metatarsal head first: Return Appointment in 1 week. Edema Control: Wound #6 Right,Lateral Malleolus: Kerlix and Coban - Right Lower Extremity Wound #8 Right,Lateral [name]: Kerlix and Coban - Right Lower Extremity Home Health: Wound #6 Right,Lateral Malleolus: [place] Nurse may visit PRN to address patient s wound care needs. FACE TO FACE ENCOUNTER: MEDICARE and MEDICAID PATIENTS: I certify that this patient is under my care and that I had a face-to-face encounter that meets the physician face-to-face encounter requirements with this patient on this date. The encounter with the patient was in whole or in part for the following MEDICAL CONDITION: (primary reason for [place]) MEDICAL NECESSITY: I certify, that based on my findings, NURSING services are a medically necessary home health service. HOME BOUND STATUS: I certify that my clinical findings support that this patient is homebound (i.e., Due to illness or injury, pt requires aid of supportive devices such as crutches, cane, wheelchairs, walkers, the use of  special transportation or the assistance of another person to leave their place of residence. There is a normal inability to leave the home and doing so requires considerable and taxing effort. Other absences are for medical reasons / religious services and are infrequent or of short duration when for other reasons). If current dressing causes regression in wound condition, may D/C ordered dressing product/s and apply Normal Saline Moist Dressing daily until next [place] / Other MD appointment. [place] of regression in wound condition at [phone]. Please direct any NON-WOUND related issues/requests for orders to patient's Primary Care Physician Wound #8 Right,Lateral [name]: [place] Nurse may visit PRN to address patient s wound care needs. FACE TO FACE ENCOUNTER: MEDICARE and MEDICAID PATIENTS: I certify that this patient is under my care and that I had a face-to-face encounter that meets the physician face-to-face encounter requirements with this patient on this date. The encounter with the patient was in whole or in part for the following MEDICAL CONDITION: (primary reason for [place]) MEDICAL NECESSITY: I certify, that based on my findings, NURSING services are a medically necessary home health service. HOME BOUND STATUS: I certify that my clinical findings support that this patient is homebound (i.e., Due to illness or injury, pt requires aid of supportive devices such as crutches, cane, wheelchairs, walkers, the use of special transportation or the assistance of another person to leave their place of residence. There is a normal inability to leave the home and doing so requires considerable and taxing effort. Other absences are for medical reasons / religious services and are infrequent or of short duration when for other reasons). If current dressing causes regression in wound condition, may D/C ordered dressing  product/s and apply Normal Saline Moist Dressing daily until next [place] / Other MD appointment. [place] of regression in wound condition at [phone]. Please direct any NON-WOUND related issues/requests for orders to patient's Primary Care Physician Wound #9 Left Metatarsal head first: Brule, Geoffery E. (  [id number]) [place] Nurse may visit PRN to address patient s wound care needs. FACE TO FACE ENCOUNTER: MEDICARE and MEDICAID PATIENTS: I certify that this patient is under my care and that I had a face-to-face encounter that meets the physician face-to-face encounter requirements with this patient on this date. The encounter with the patient was in whole or in part for the following MEDICAL CONDITION: (primary reason for [place]) MEDICAL NECESSITY: I certify, that based on my findings, NURSING services are a medically necessary home health service. HOME BOUND STATUS: I certify that my clinical findings support that this patient is homebound (i.e., Due to illness or injury, pt requires aid of supportive devices such as crutches, cane, wheelchairs, walkers, the use of special transportation or the assistance of another person to leave their place of residence. There is a normal inability to leave the home and doing so requires considerable and taxing effort. Other absences are for medical reasons / religious services and are infrequent or of short duration when for other reasons). If current dressing causes regression in wound condition, may D/C ordered dressing product/s and apply Normal Saline Moist Dressing daily until next [place] / Other MD appointment. [place] of regression in wound condition at [phone]. Please direct any NON-WOUND related issues/requests for orders to patient's Primary Care Physician Laboratory ordered were: Wound culture routine - right lateral [name] #1 I  changed his primary dressing to the right lateral malleolus and right lateral [name] to Santyl. #2 continue to silver alginate to the left first metatarsal head which appears better #3 I think we are finally in the process of getting a vascular surgery consult about the right sided lower extremity wounds #4 the patient is not a candidate for an MRI because of an implantable defibrillator, a CT scan of the [name] might be appropriate but I'd like to review his macrovascular status first #5 some of his weakness complaints almost sound myasthenic/fatigue in nature. I wonder whether he has had EMGs and nerve conduction studies. I'll try to review his neurology note Electronic Signature(s) Signed: 06/04/2017 8:24:56 AM By: Linton Ham MD Entered By: Linton Ham on 06/03/2017 12:47:57 Pippen, [name] ([id number]) -------------------------------------------------------------------------------- SuperBill Details Patient Name: [name] Date of Service: 06/03/2017 Medical Record Number: [id number] Patient Account Number: [id number] Date of Birth/Sex: [birth year] (76 y.o. 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 I25.5 Ischemic cardiomyopathy Facility Procedures CPT4 Code Description: 37342876 11042 - DEB SUBQ TISSUE 20 SQ CM/< ICD-10 Diagnosis Description L97.211 Non-pressure chronic ulcer of right calf limited to breakdown o Modifier: 59 f skin Quantity: 1 CPT4 Code Description: [id number] 11043 - DEB MUSC/FASCIA 20 SQ CM/< ICD-10 Diagnosis  Description L97.511 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 limited Modifier: to breakdown of Quantity: 1 skin Physician Procedures CPT4 Code Description: [id number] 16384 - WC PHYS SUBQ TISS 20 SQ CM ICD-10 Diagnosis Description L97.211 Non-pressure chronic ulcer of right calf limited to breakdown of Modifier: 59 skin Quantity: 1 CPT4 Code Description: [id number] 11043 - WC PHYS DEBR MUSCLE/FASCIA 20 SQ CM ICD-10 Diagnosis Description  L97.511 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 limited to Modifier: breakdown of Quantity: 1 skin Electronic Signature(s) Signed: 06/04/2017 8:24:56 AM By: Linton Ham MD Entered By: Linton Ham on 06/03/2017 12:48:43

## 2017-06-07 LAB — AEROBIC CULTURE  (SUPERFICIAL SPECIMEN)

## 2017-06-07 LAB — AEROBIC CULTURE W GRAM STAIN (SUPERFICIAL SPECIMEN): Gram Stain: NONE SEEN

## 2017-06-08 DIAGNOSIS — M069 Rheumatoid arthritis, unspecified: Secondary | ICD-10-CM | POA: Diagnosis not present

## 2017-06-08 DIAGNOSIS — S81811D Laceration without foreign body, right lower leg, subsequent encounter: Secondary | ICD-10-CM | POA: Diagnosis not present

## 2017-06-08 DIAGNOSIS — I5023 Acute on chronic systolic (congestive) heart failure: Secondary | ICD-10-CM | POA: Diagnosis not present

## 2017-06-08 DIAGNOSIS — I2510 Atherosclerotic heart disease of native coronary artery without angina pectoris: Secondary | ICD-10-CM | POA: Diagnosis not present

## 2017-06-08 DIAGNOSIS — I110 Hypertensive heart disease with heart failure: Secondary | ICD-10-CM | POA: Diagnosis not present

## 2017-06-08 DIAGNOSIS — I255 Ischemic cardiomyopathy: Secondary | ICD-10-CM | POA: Diagnosis not present

## 2017-06-09 NOTE — Progress Notes (Signed)
[name], [name] ([id number]) Visit Report for 06/03/2017 Arrival Information Details Patient Name: AYMAN, BRULL Date of Service: 06/03/2017 11:15 AM Medical Record Number: [id number] Patient Account Number: [id number] Date of Birth/Sex: 05/31/1940 (76 y.o. M) Treating RN: Secundino Ginger Primary Care Leevi Cullars: Cyndi Bender Other Clinician: Referring Nani Ingram: Cyndi Bender Treating Kiley Solimine/Extender: Tito Dine in Treatment: 8 Visit Information History Since Last Visit All ordered tests and consults were completed: No Patient Arrived: Cane Added or deleted any medications: No Arrival Time: 11:19 Any new allergies or adverse reactions: No Accompanied By: wife Had a fall or experienced change in No Transfer Assistance: None activities of daily living that may affect Patient Requires Transmission-Based No risk of falls: Precautions: Signs or symptoms of abuse/neglect since last visito No Patient Has Alerts: Yes Hospitalized since last visit: No Patient Alerts: R ABI Implantable device outside of the clinic excluding No >220 cellular tissue based products placed in the center since last visit: Has Dressing in Place as Prescribed: Yes Pain Present Now: No Electronic Signature(s) Signed: 06/09/2017 11:57:38 AM By: Secundino Ginger Entered By: Secundino Ginger on 06/03/2017 11:22:49 [name], [name] ([id number]) -------------------------------------------------------------------------------- Encounter Discharge Information Details Patient Name: [name] Date of Service: 06/03/2017 11:15 AM Medical Record Number: [id number] Patient Account Number: [id number] Date of Birth/Sex: 05-20-1940 (76 y.o. M) Treating RN: Montey Hora Primary Care Zebedee Segundo: Cyndi Bender Other Clinician: Referring Avner Stroder: Cyndi Bender Treating Ariannie Penaloza/Extender: Tito Dine in Treatment: 8 Encounter Discharge Information Items Discharge Condition: Stable Ambulatory Status:  Cane Discharge Destination: Home Transportation: Private Auto Accompanied By: spouse Schedule Follow-up Appointment: Yes Clinical Summary of Care: Electronic Signature(s) Signed: 06/03/2017 12:58:11 PM By: Montey Hora Entered By: Montey Hora on 06/03/2017 12:58:11 [name], [name] ([id number]) -------------------------------------------------------------------------------- Lower Extremity Assessment Details Patient Name: [name] Date of Service: 06/03/2017 11:15 AM Medical Record Number: [id number] Patient Account Number: [id number] Date of Birth/Sex: December 02, 1940 (76 y.o. M) Treating RN: Secundino Ginger Primary Care Karell Tukes: Cyndi Bender Other Clinician: Referring Dupree Givler: Cyndi Bender Treating Felita Bump/Extender: Tito Dine in Treatment: 8 Edema Assessment Assessed: [Left: No] [Right: No] Edema: [Left: Yes] [Right: No] Calf Left: Right: Point of Measurement: 27 cm From Medial Instep 30.5 cm 29 cm Ankle Left: Right: Point of Measurement: 11 cm From Medial Instep 25 cm 21.5 cm Vascular Assessment Claudication: Claudication Assessment [Left:None] Pulses: Dorsalis Pedis Palpable: [Left:Yes] [Right:Yes] Posterior Tibial Extremity colors, hair growth, and conditions: Hair Growth on Extremity: [Left:No] [Right:No] Temperature of Extremity: [Left:Cool] [Right:Cool] Capillary Refill: [Left:< 3 seconds] [Right:< 3 seconds] Toe Nail Assessment Left: Right: Thick: Yes Yes Discolored: Yes Yes Deformed: Yes Yes Improper Length and Hygiene: Yes Yes Electronic Signature(s) Signed: 06/09/2017 11:57:38 AM By: Secundino Ginger Entered By: Secundino Ginger on 06/03/2017 12:07:30 [name], [name] ([id number]) -------------------------------------------------------------------------------- Multi Wound Chart Details Patient Name: [name] Date of Service: 06/03/2017 11:15 AM Medical Record Number: [id number] Patient Account Number: [id number] Date of Birth/Sex:  December 16, 1940 (76 y.o. M) Treating RN: Cornell Barman Primary Care Aleaya Latona: Cyndi Bender Other Clinician: Referring Neilani Duffee: Cyndi Bender Treating Serenitee Fuertes/Extender: Tito Dine in Treatment: 8 Vital Signs Height(in): 68 Pulse(bpm): 25 Weight(lbs): Blood Pressure(mmHg): 102/59 Body Mass Index(BMI): Temperature(F): 97.6 Respiratory Rate 18 (breaths/min): Photos: [4:No Photos] [6:No Photos] [8:No Photos] Wound Location: [4:Right Lower Leg - Lateral, Proximal] [6:Right Malleolus - Lateral] [8:Right, Lateral [name]] Wounding Event: [4:Gradually Appeared] [6:Gradually Appeared] [8:Gradually Appeared] Primary Etiology: [4:Arterial Insufficiency Ulcer] [6:Arterial Insufficiency Ulcer] [8:Arterial Insufficiency Ulcer] Comorbid History: [4:Congestive Heart Failure, Coronary Artery Disease, Hypertension,  Myocardial Infarction, Osteoarthritis] [6:Congestive Heart Failure, Coronary Artery Disease, Hypertension, Myocardial Infarction, Osteoarthritis] [8:N/A] Date Acquired: [4:04/08/2017] [6:03/23/2017] [8:04/29/2017] Weeks of Treatment: [4:8] [6:8] [8:5] Wound Status: [4:Open] [6:Open] [8:Open] Measurements L x W x D [4:0x0x0] [6:0.7x0.6x0.3] [8:1x1.2x0.4] (cm) Area (cm) : [4:0] [6:0.33] [8:0.942] Volume (cm) : [4:0] [6:0.099] [8:0.377] % Reduction in Area: [4:100.00%] [6:-964.50%] [8:-1226.80%] % Reduction in Volume: [4:100.00%] [6:-3200.00%] [8:-5285.70%] Classification: [4:Full Thickness Without Exposed Support Structures] [6:Full Thickness Without Exposed Support Structures] [8:Partial Thickness] Exudate Amount: [4:None Present] [6:Medium] [8:N/A] Exudate Type: [4:N/A] [6:Serous] [8:N/A] Exudate Color: [4:N/A] [6:amber] [8:N/A] Wound Margin: [4:Flat and Intact] [6:Distinct, outline attached] [8:N/A] Granulation Amount: [4:None Present (0%)] [6:None Present (0%)] [8:N/A] Granulation Quality: [4:N/A] [6:N/A] [8:N/A] Necrotic Amount: [4:Large (67-100%)] [6:Large (67-100%)]  [8:N/A] Necrotic Tissue: [4:Eschar] [6:Eschar, Adherent Slough] [8:N/A] Exposed Structures: [4:Fascia: No Fat Layer (Subcutaneous Tissue) Exposed: No Tendon: No Muscle: No Joint: No Bone: No] [6:Fascia: No Fat Layer (Subcutaneous Tissue) Exposed: No Tendon: No Muscle: No Joint: No Bone: No] [8:N/A] Epithelialization: [4:Large (67-100%)] [6:None] [8:N/A] Debridement: [4:N/A] [6:Debridement - Excisional] [8:Debridement - Excisional] Pre-procedure N/A 11:55 11:55 Verification/Time Out Taken: Pain Control: N/A Other Other Tissue Debrided: N/A Subcutaneous Muscle, Subcutaneous Level: N/A Skin/Subcutaneous Tissue Skin/Subcutaneous Tissue/Muscle Debridement Area (sq cm): N/A 0.42 1.2 Instrument: N/A Curette Curette Specimen: N/A None Swab Number of Specimens N/A N/A 1 Taken: Bleeding: N/A Moderate Moderate Hemostasis Achieved: N/A Pressure Pressure Procedural Pain: N/A 1 3 Post Procedural Pain: N/A 1 1 Debridement Treatment N/A Procedure was tolerated well Procedure was tolerated well Response: Post Debridement N/A 0.7x0.6x0.3 1x1.2x0.5 Measurements L x W x D (cm) Post Debridement Volume: N/A 0.099 0.471 (cm) Periwound Skin Texture: Rash: Yes Excoriation: No No Abnormalities Noted Excoriation: No Induration: No Induration: No Callus: No Callus: No Crepitus: No Crepitus: No Rash: No Scarring: No Scarring: No Periwound Skin Moisture: Maceration: No Maceration: No No Abnormalities Noted Dry/Scaly: No Dry/Scaly: No Periwound Skin Color: Atrophie Blanche: No Atrophie Blanche: No No Abnormalities Noted Cyanosis: No Cyanosis: No Ecchymosis: No Ecchymosis: No Erythema: No Erythema: No Hemosiderin Staining: No Hemosiderin Staining: No Mottled: No Mottled: No Pallor: No Pallor: No Rubor: No Rubor: No Temperature: No Abnormality No Abnormality N/A Tenderness on Palpation: No Yes No Wound Preparation: Ulcer Cleansing: Ulcer Cleansing: N/A Rinsed/Irrigated with  Saline, Rinsed/Irrigated with Saline, Other: soap and water Other: soap and water Topical Anesthetic Applied: Topical Anesthetic Applied: Other: lidocaine 4% Other: lidocaine 4% Procedures Performed: N/A Debridement Debridement Wound Number: 9 N/A N/A Photos: No Photos N/A N/A Wound Location: Left Metatarsal head first N/A N/A Wounding Event: Gradually Appeared N/A N/A Primary Etiology: Pressure Ulcer N/A N/A Comorbid History: Congestive Heart Failure, N/A N/A Coronary Artery Disease, Hypertension, Myocardial Infarction, Osteoarthritis Date Acquired: 05/06/2017 N/A N/A [name], [name] ([id number]) Weeks of Treatment: 2 N/A N/A Wound Status: Open N/A N/A Measurements L x W x D 1x0.9x0.1 N/A N/A (cm) Area (cm) : 0.707 N/A N/A Volume (cm) : 0.071 N/A N/A % Reduction in Area: -260.70% N/A N/A % Reduction in Volume: -255.00% N/A N/A Classification: Category/Stage II N/A N/A Exudate Amount: Medium N/A N/A Exudate Type: Serous N/A N/A Exudate Color: amber N/A N/A Wound Margin: Flat and Intact N/A N/A Granulation Amount: Medium (34-66%) N/A N/A Granulation Quality: Pink N/A N/A Necrotic Amount: Medium (34-66%) N/A N/A Necrotic Tissue: Adherent Slough N/A N/A Exposed Structures: Fat Layer (Subcutaneous N/A N/A Tissue) Exposed: Yes Fascia: No Tendon: No Muscle: No Joint: No Bone: No Epithelialization: None N/A N/A Debridement: N/A N/A N/A Pain Control: N/A N/A N/A  Tissue Debrided: N/A N/A N/A Level: N/A N/A N/A Debridement Area (sq cm): N/A N/A N/A Instrument: N/A N/A N/A Bleeding: N/A N/A N/A Hemostasis Achieved: N/A N/A N/A Procedural Pain: N/A N/A N/A Post Procedural Pain: N/A N/A N/A Debridement Treatment N/A N/A N/A Response: Post Debridement N/A N/A N/A Measurements L x W x D (cm) Post Debridement Volume: N/A N/A N/A (cm) Periwound Skin Texture: Excoriation: No N/A N/A Induration: No Callus: No Crepitus: No Rash: No Scarring: No Periwound Skin  Moisture: Maceration: No N/A N/A Dry/Scaly: No Periwound Skin Color: Atrophie Blanche: No N/A N/A Cyanosis: No Ecchymosis: No Erythema: No Hemosiderin Staining: No Mottled: No [name], Jermaine E. ([id number]) Pallor: No Rubor: No Temperature: No Abnormality N/A N/A Tenderness on Palpation: No N/A N/A Wound Preparation: Ulcer Cleansing: N/A N/A Rinsed/Irrigated with Saline Topical Anesthetic Applied: Other: lidocaine 4% Procedures Performed: N/A N/A N/A Treatment Notes Electronic Signature(s) Signed: 06/04/2017 8:24:56 AM By: Linton Ham MD Entered By: Linton Ham on 06/03/2017 12:32:37 [name], [name] ([id number]) -------------------------------------------------------------------------------- Multi-Disciplinary Care Plan Details Patient Name: [name] Date of Service: 06/03/2017 11:15 AM Medical Record Number: [id number] Patient Account Number: [id number] Date of Birth/Sex: 02/26/1940 (76 y.o. M) Treating RN: Cornell Barman Primary Care Miles Borkowski: Cyndi Bender Other Clinician: Referring Audreyanna Butkiewicz: Cyndi Bender Treating Amarianna Abplanalp/Extender: Tito Dine in Treatment: 8 Active Inactive ` Abuse / Safety / Falls / Self Care Management Nursing Diagnoses: History of Falls Goals: Patient will remain injury free related to falls Date Initiated: 04/08/2017 Target Resolution Date: 05/08/2017 Goal Status: Active Interventions: Assess fall risk on admission and as needed Notes: ` Orientation to the Wound Care Program Nursing Diagnoses: Knowledge deficit related to the wound healing center program Goals: Patient/caregiver will verbalize understanding of the [facility] Program Date Initiated: 04/08/2017 Target Resolution Date: 05/08/2017 Goal Status: Active Interventions: Provide education on orientation to the wound center Notes: ` Soft Tissue Infection Nursing Diagnoses: Impaired tissue integrity Potential for infection: soft  tissue Goals: Patient will remain free of wound infection Date Initiated: 04/08/2017 Target Resolution Date: 05/08/2017 Goal Status: Active JAHKING, LESSER ([id number]) Interventions: Assess signs and symptoms of infection every visit Notes: ` Wound/Skin Impairment Nursing Diagnoses: Impaired tissue integrity Goals: Ulcer/skin breakdown will heal within 14 weeks Date Initiated: 04/08/2017 Target Resolution Date: 07/20/2017 Goal Status: Active Interventions: Assess patient/caregiver ability to perform ulcer/skin care regimen upon admission and as needed Provide education on ulcer and skin care Treatment Activities: Topical wound management initiated : 04/08/2017 Notes: Electronic Signature(s) Signed: 06/03/2017 5:21:30 PM By: Gretta Cool, BSN, RN, CWS, Kim RN, BSN Entered By: Gretta Cool, BSN, RN, CWS, Kim on 06/03/2017 11:54:39 [name], [name] ([id number]) -------------------------------------------------------------------------------- Pain Assessment Details Patient Name: [name] Date of Service: 06/03/2017 11:15 AM Medical Record Number: [id number] Patient Account Number: [id number] Date of Birth/Sex: 09/15/1940 (76 y.o. M) Treating RN: Secundino Ginger Primary Care Amarien Carne: Cyndi Bender Other Clinician: Referring Yzabella Crunk: Cyndi Bender Treating Leonardo Plaia/Extender: Tito Dine in Treatment: 8 Active Problems Location of Pain Severity and Description of Pain Patient Has Paino No Site Locations Pain Management and Medication Current Pain Management: Electronic Signature(s) Signed: 06/09/2017 11:57:38 AM By: Secundino Ginger Entered By: Secundino Ginger on 06/03/2017 11:23:49 [name], [name] ([id number]) -------------------------------------------------------------------------------- Patient/Caregiver Education Details Patient Name: [name] Date of Service: 06/03/2017 11:15 AM Medical Record Number: [id number] Patient Account Number: [id number] Date of Birth/Gender:  06-15-1940 (76 y.o. M) Treating RN: Montey Hora Primary Care Physician: Cyndi Bender Other Clinician: Referring Physician: Cyndi Bender Treating Physician/Extender: Dellia Nims  MICHAEL G Weeks in Treatment: 8 Education Assessment Education Provided To: Patient Education Topics Provided Venous: Handouts: Other: leg elevation Methods: Explain/Verbal Responses: State content correctly Electronic Signature(s) Signed: 06/03/2017 1:57:55 PM By: Montey Hora Entered By: Montey Hora on 06/03/2017 12:58:46 [name], [name] ([id number]) -------------------------------------------------------------------------------- Wound Assessment Details Patient Name: [name] Date of Service: 06/03/2017 11:15 AM Medical Record Number: [id number] Patient Account Number: [id number] Date of Birth/Sex: [birth year] (76 y.o. M) Treating RN: Secundino Ginger Primary Care Abanoub Hanken: Cyndi Bender Other Clinician: Referring Mayda Shippee: Cyndi Bender Treating Marylou Wages/Extender: Tito Dine in Treatment: 8 Wound Status Wound Number: 4 Primary Arterial Insufficiency Ulcer Etiology: Wound Location: Right Lower Leg - Lateral, Proximal Wound Open Wounding Event: Gradually Appeared Status: Date Acquired: 04/08/2017 Comorbid Congestive Heart Failure, Coronary Artery Weeks Of Treatment: 8 History: Disease, Hypertension, Myocardial Infarction, Clustered Wound: No Osteoarthritis Wound Measurements Length: (cm) 0 % Red Width: (cm) 0 % Red Depth: (cm) 0 Epith Area: (cm) 0 Volume: (cm) 0 uction in Area: 100% uction in Volume: 100% elialization: Large (67-100%) Wound Description Full Thickness Without Exposed Support Foul Classification: Structures Sloug Wound Margin: Flat and Intact Exudate None Present Amount: Odor After Cleansing: No h/Fibrino Yes Wound Bed Granulation Amount: None Present (0%) Exposed Structure Necrotic Amount: Large (67-100%) Fascia Exposed: No Necrotic  Quality: Eschar Fat Layer (Subcutaneous Tissue) Exposed: No Tendon Exposed: No Muscle Exposed: No Joint Exposed: No Bone Exposed: No Periwound Skin Texture Texture Color No Abnormalities Noted: No No Abnormalities Noted: No Callus: No Atrophie Blanche: No Crepitus: No Cyanosis: No Excoriation: No Ecchymosis: No Induration: No Erythema: No Rash: Yes Hemosiderin Staining: No Scarring: No Mottled: No Pallor: No Moisture Rubor: No No Abnormalities Noted: No Dry / Scaly: No Temperature / Pain Maceration: No Temperature: No Abnormality Lipson, Reagan E. ([id number]) Wound Preparation Ulcer Cleansing: Rinsed/Irrigated with Saline, Other: soap and water, Topical Anesthetic Applied: Other: lidocaine 4%, Electronic Signature(s) Signed: 06/09/2017 11:57:38 AM By: Secundino Ginger Entered By: Secundino Ginger on 06/03/2017 11:43:24 [name], [name] ([id number]) -------------------------------------------------------------------------------- Wound Assessment Details Patient Name: [name] Date of Service: 06/03/2017 11:15 AM Medical Record Number: [id number] Patient Account Number: [id number] Date of Birth/Sex: [birth year] (76 y.o. M) Treating RN: Secundino Ginger Primary Care Shakelia Scrivner: Cyndi Bender Other Clinician: Referring Doree Kuehne: Cyndi Bender Treating Rolfe Hartsell/Extender: Tito Dine in Treatment: 8 Wound Status Wound Number: 6 Primary Arterial Insufficiency Ulcer Etiology: Wound Location: Right Malleolus - Lateral Wound Open Wounding Event: Gradually Appeared Status: Date Acquired: 03/23/2017 Comorbid Congestive Heart Failure, Coronary Artery Weeks Of Treatment: 8 History: Disease, Hypertension, Myocardial Infarction, Clustered Wound: No Osteoarthritis Wound Measurements Length: (cm) 0.7 Width: (cm) 0.6 Depth: (cm) 0.3 Area: (cm) 0.33 Volume: (cm) 0.099 % Reduction in Area: -964.5% % Reduction in Volume: -3200% Epithelialization: None Tunneling:  No Undermining: No Wound Description Full Thickness Without Exposed Support Classification: Structures Wound Margin: Distinct, outline attached Exudate Medium Amount: Exudate Type: Serous Exudate Color: amber Foul Odor After Cleansing: No Slough/Fibrino Yes Wound Bed Granulation Amount: None Present (0%) Exposed Structure Necrotic Amount: Large (67-100%) Fascia Exposed: No Necrotic Quality: Eschar, Adherent Slough Fat Layer (Subcutaneous Tissue) Exposed: No Tendon Exposed: No Muscle Exposed: No Joint Exposed: No Bone Exposed: No Periwound Skin Texture Texture Color No Abnormalities Noted: No No Abnormalities Noted: No Callus: No Atrophie Blanche: No Crepitus: No Cyanosis: No Excoriation: No Ecchymosis: No Induration: No Erythema: No Rash: No Hemosiderin Staining: No Scarring: No Mottled: No Pallor: No Moisture Rubor: No No Abnormalities Noted: No Camberos, Kenroy E. ([id number]) Dry /  Scaly: No Temperature / Pain Maceration: No Temperature: No Abnormality Tenderness on Palpation: Yes Wound Preparation Ulcer Cleansing: Rinsed/Irrigated with Saline, Other: soap and water, Topical Anesthetic Applied: Other: lidocaine 4%, Treatment Notes Wound #6 (Right, Lateral Malleolus) 1. Cleansed with: Clean wound with Normal Saline Cleanse wound with antibacterial soap and water 2. Anesthetic Topical Lidocaine 4% cream to wound bed prior to debridement 3. Peri-wound Care: Other peri-wound care (specify in notes) 4. Dressing Applied: Prisma Ag 5. Secondary Dressing Applied ABD Pad 7. Secured with Other (specify in notes) Notes TCA, kerlix/coban wrap Electronic Signature(s) Signed: 06/09/2017 11:57:38 AM By: Secundino Ginger Entered By: Secundino Ginger on 06/03/2017 11:48:50 [place], [name] ([id number]) -------------------------------------------------------------------------------- Wound Assessment Details Patient Name: [name] Date of Service: 06/03/2017  11:15 AM Medical Record Number: [id number] Patient Account Number: [id number] Date of Birth/Sex: 1940-09-24 (76 y.o. M) Treating RN: Secundino Ginger Primary Care Harshitha Fretz: Cyndi Bender Other Clinician: Referring Antoneo Ghrist: Cyndi Bender Treating Daniell Paradise/Extender: Tito Dine in Treatment: 8 Wound Status Wound Number: 8 Primary Etiology: Arterial Insufficiency Ulcer Wound Location: Right, Lateral [name] Wound Status: Open Wounding Event: Gradually Appeared Date Acquired: 04/29/2017 Weeks Of Treatment: 5 Clustered Wound: No Wound Measurements Length: (cm) 1 Width: (cm) 1.2 Depth: (cm) 0.4 Area: (cm) 0.942 Volume: (cm) 0.377 % Reduction in Area: -1226.8% % Reduction in Volume: -5285.7% Wound Description Classification: Partial Thickness Periwound Skin Texture Texture Color No Abnormalities Noted: No No Abnormalities Noted: No Moisture No Abnormalities Noted: No Treatment Notes Wound #8 (Right, Lateral [name]) 1. Cleansed with: Clean wound with Normal Saline Cleanse wound with antibacterial soap and water 2. Anesthetic Topical Lidocaine 4% cream to wound bed prior to debridement 3. Peri-wound Care: Other peri-wound care (specify in notes) 4. Dressing Applied: Prisma Ag 5. Secondary Dressing Applied ABD Pad 7. Secured with Other (specify in notes) Notes TCA, kerlix/coban wrap Electronic Signature(s) Signed: 06/09/2017 11:57:38 AM By: Catheryn Bacon ([id number]) Entered By: Secundino Ginger on 06/03/2017 11:35:27 [place], [name] ([id number]) -------------------------------------------------------------------------------- Wound Assessment Details Patient Name: [name] Date of Service: 06/03/2017 11:15 AM Medical Record Number: [id number] Patient Account Number: [id number] Date of Birth/Sex: [birth year] (76 y.o. M) Treating RN: Secundino Ginger Primary Care Kery Haltiwanger: Cyndi Bender Other Clinician: Referring Leena Tiede: Cyndi Bender Treating  Zona Pedro/Extender: Tito Dine in Treatment: 8 Wound Status Wound Number: 9 Primary Pressure Ulcer Etiology: Wound Location: Left Metatarsal head first Wound Open Wounding Event: Gradually Appeared Status: Date Acquired: 05/06/2017 Comorbid Congestive Heart Failure, Coronary Artery Weeks Of Treatment: 2 History: Disease, Hypertension, Myocardial Infarction, Clustered Wound: No Osteoarthritis Wound Measurements Length: (cm) 1 Width: (cm) 0.9 Depth: (cm) 0.1 Area: (cm) 0.707 Volume: (cm) 0.071 % Reduction in Area: -260.7% % Reduction in Volume: -255% Epithelialization: None Tunneling: No Undermining: No Wound Description Classification: Category/Stage II Foul Od Wound Margin: Flat and Intact Slough/ Exudate Amount: Medium Exudate Type: Serous Exudate Color: amber or After Cleansing: No Fibrino Yes Wound Bed Granulation Amount: Medium (34-66%) Exposed Structure Granulation Quality: Pink Fascia Exposed: No Necrotic Amount: Medium (34-66%) Fat Layer (Subcutaneous Tissue) Exposed: Yes Necrotic Quality: Adherent Slough Tendon Exposed: No Muscle Exposed: No Joint Exposed: No Bone Exposed: No Periwound Skin Texture Texture Color No Abnormalities Noted: No No Abnormalities Noted: No Callus: No Atrophie Blanche: No Crepitus: No Cyanosis: No Excoriation: No Ecchymosis: No Induration: No Erythema: No Rash: No Hemosiderin Staining: No Scarring: No Mottled: No Pallor: No Moisture Rubor: No No Abnormalities Noted: No Dry / Scaly: No Temperature / Pain Maceration: No Temperature:  No Abnormality Raborn, SMOKEY MELOTT ([id number]) Wound Preparation Ulcer Cleansing: Rinsed/Irrigated with Saline Topical Anesthetic Applied: Other: lidocaine 4%, Treatment Notes Wound #9 (Left Metatarsal head first) 1. Cleansed with: Clean wound with Normal Saline 3. Peri-wound Care: Skin Prep 4. Dressing Applied: Prisma Ag 5. Secondary Dressing Applied Bordered  Foam Dressing Electronic Signature(s) Signed: 06/09/2017 11:57:38 AM By: Secundino Ginger Entered By: Secundino Ginger on 06/03/2017 11:46:15 Witts, [name] ([id number]) -------------------------------------------------------------------------------- Vitals Details Patient Name: [name] Date of Service: 06/03/2017 11:15 AM Medical Record Number: [id number] Patient Account Number: [id number] Date of Birth/Sex: [birth year] (76 y.o. M) Treating RN: Secundino Ginger Primary Care Naiya Corral: Cyndi Bender Other Clinician: Referring Tradarius Reinwald: Cyndi Bender Treating German Manke/Extender: Tito Dine in Treatment: 8 Vital Signs Time Taken: 11:25 Temperature (F): 97.6 Height (in): 68 Pulse (bpm): 64 Respiratory Rate (breaths/min): 18 Blood Pressure (mmHg): 102/59 Reference Range: 80 - 120 mg / dl Electronic Signature(s) Signed: 06/09/2017 11:57:38 AM By: Secundino Ginger Entered By: Secundino Ginger on 06/03/2017 11:25:41

## 2017-06-10 ENCOUNTER — Encounter: Payer: Medicare Other | Attending: Internal Medicine | Admitting: Internal Medicine

## 2017-06-10 DIAGNOSIS — B952 Enterococcus as the cause of diseases classified elsewhere: Secondary | ICD-10-CM | POA: Diagnosis not present

## 2017-06-10 DIAGNOSIS — L97521 Non-pressure chronic ulcer of other part of left foot limited to breakdown of skin: Secondary | ICD-10-CM | POA: Diagnosis not present

## 2017-06-10 DIAGNOSIS — I890 Lymphedema, not elsewhere classified: Secondary | ICD-10-CM | POA: Diagnosis not present

## 2017-06-10 DIAGNOSIS — L97511 Non-pressure chronic ulcer of other part of right foot limited to breakdown of skin: Secondary | ICD-10-CM | POA: Insufficient documentation

## 2017-06-10 DIAGNOSIS — L97312 Non-pressure chronic ulcer of right ankle with fat layer exposed: Secondary | ICD-10-CM | POA: Diagnosis not present

## 2017-06-10 DIAGNOSIS — I255 Ischemic cardiomyopathy: Secondary | ICD-10-CM | POA: Insufficient documentation

## 2017-06-10 DIAGNOSIS — L97311 Non-pressure chronic ulcer of right ankle limited to breakdown of skin: Secondary | ICD-10-CM | POA: Insufficient documentation

## 2017-06-10 DIAGNOSIS — L97516 Non-pressure chronic ulcer of other part of right foot with bone involvement without evidence of necrosis: Secondary | ICD-10-CM | POA: Diagnosis not present

## 2017-06-10 DIAGNOSIS — L97522 Non-pressure chronic ulcer of other part of left foot with fat layer exposed: Secondary | ICD-10-CM | POA: Diagnosis not present

## 2017-06-11 DIAGNOSIS — I472 Ventricular tachycardia: Secondary | ICD-10-CM | POA: Diagnosis not present

## 2017-06-12 DIAGNOSIS — I110 Hypertensive heart disease with heart failure: Secondary | ICD-10-CM | POA: Diagnosis not present

## 2017-06-12 DIAGNOSIS — I5023 Acute on chronic systolic (congestive) heart failure: Secondary | ICD-10-CM | POA: Diagnosis not present

## 2017-06-12 DIAGNOSIS — I2510 Atherosclerotic heart disease of native coronary artery without angina pectoris: Secondary | ICD-10-CM | POA: Diagnosis not present

## 2017-06-12 DIAGNOSIS — I255 Ischemic cardiomyopathy: Secondary | ICD-10-CM | POA: Diagnosis not present

## 2017-06-12 DIAGNOSIS — S81811D Laceration without foreign body, right lower leg, subsequent encounter: Secondary | ICD-10-CM | POA: Diagnosis not present

## 2017-06-12 DIAGNOSIS — M069 Rheumatoid arthritis, unspecified: Secondary | ICD-10-CM | POA: Diagnosis not present

## 2017-06-12 NOTE — Progress Notes (Signed)
ARAFAT, COCUZZA ([id number]) Visit Report for 06/10/2017 HPI Details Patient Name: [name], [name] Date of Service: 06/10/2017 8:30 AM Medical Record Number: [id number] Patient Account Number: [id number] Date of Birth/Sex: October 06, 1940 (77 y.o. M) Treating RN: Cornell Barman Primary Care Provider: Cyndi Bender Other Clinician: Referring Provider: Cyndi Bender Treating Provider/Extender: Tito Dine in Treatment: 9 History of Present Illness HPI Description: 04/08/17; this is a complex 77 year old man referred here from [facility]. He had been referred there for bilateral lower extremity edema with ulcer formation predominantly on the right calf but also the right foot. He had been receiving Unna boots bilaterally. The history here is long. He is not a diabetic however ICU looking through late 2018 he was worked up for chronic headaches, elevated inflammatory markers including C-reactive protein and ESR. He went on to actually have a left temporal artery biopsy wasn't that was negative.he received about 6 weeks of high-dose prednisone 60 mg with improvement in his inflammatory markers. He was admitted to hospital in late November with ventricular tachycardia syncope. He has known ischemic cardiomyopathy. He was admitted in the hospital in mid December. Apparently this was precipitated by a syncopal spell falling out of his scooter while at [place]. There was ventricular arrhythmia. He has an implantable defibrillator and echocardiogram showed severe LV dysfunction with an EF of 20% and valvular regurgitations including mild AR, moderate MR. There was no stenosis. He ruled in for a non-ST elevation MI in the setting of V. tach.his wife states that sometime during this hospitalization she noted multiple areas of skin change on the right lower calf which became evident just after he left the hospital. He was back in hospital in February with acute renal failure  hyponatremia. This responded to fluid resuscitation.. Interestingly I can't see much description of his right leg at that point in time.he was followed by Dr. Nehemiah Massed of dermatology for the necrotic wounds on his right leg. Apparently a biopsy was planned at one point but not done although in some notes that suggests it was. I cannot see these results area He was noted to have a lot of edema. Was treated with bilateral Unna boots edges really helped with the swelling they have been using Bactroban to small open areas predominantly on the right anterior lower leg His history is complicated by the fact that he has rheumatoid arthritis followed by rheumatology. He is followed by neurology for disabling headaches. At one point this was felt to be giant cell arteritis although a left temporal biopsy was apparently negative. He was given a prolonged course of prednisone at 60 mg which managed his sedimentation rates but apparently did not prove improve the headaches. This is been tapered to off on by rheumatology on 03/13/17 Vascular had plans to do a venous reflux workup as well as arterial studies in May. They also wanted to get him a lymphedema pump. As mentioned he's been using bacitracin under Unna boot wraps to both lower legs 04/15/17; the patient arrives with most of his wounds improved. These are small punched out wounds. Most of them remaining ones are on the right anterior calf with the most problematic over the right lateral malleolus. There are no new areas. The symptom complex or potential symptom complex we are dealing with his chronic disabling headaches with inflammatory markers not responsive to prednisone and with a negative temporal biopsy, lower extremity weakness, skin ulcerations just on the right leg. We have managed to get his arterial studies  moved up to April 30. He has a rheumatology consult at [facility] in June. He has seen dermatology locally, rheumatology locally, neurology  locally. 04/22/17; small punched out areas on the right leg anteriorly posteriorly. Most of these appear to have closed over. Some of them have eschar over the surface. The most problematic area appears to be over the right lateral malleolus. We've been using prisma to all of this. He has arterial studies on April 30 and a rheumatology consult at [facility] on June 28 04/29/17; most of the small punched out areas on the right leg posteriorly are closed. He has 2 or 3 openings anteriorly but most of these appear to be on the way to closing. Still problematically over the right lateral malleolus and right lateral foot with almost ischemic-looking eschar. His arterial studies that I ordered are due to be done next week on the 30th so we should have them available for our next visit hopefully. He also saw a rheumatologist at [facility] and according to the patient he did 8 Garay, Aceyn E. ([id number]) vials of blood. Finally he has scaling rash on his left foot and what looks to be at [facility] area on the left anterior leg 05/06/17; most of the small punched out areas on the right leg posteriorly and anteriorly are closed. He still has one small one anteriorly one over the right lateral malleolus and one over the right lateral foot. He had his arterial studies they didn't seem to do waveform analysis not exactly sure why however in any case is ABIs were noncompressible bilaterally. They did provide TBIs although looking at the pressures it appears that his TBIs are quite normal. I therefore went ahead and debrided the area over the right lateral malleolus and the right lateral foot 05/13/17; most of the small punched out areas on the right leg posteriorly and anteriorly are closed. He continues to have problematic areas over the right lateral malleolus and the right lateral foot. He still requiring aggressive debridement of these 2 wounds using silver collagen I reviewed the note from rheumatology I don't think they  came up with a specific diagnosis although he is known to have seropositive RA. They did a panel of lab work when I was able to see his his AMA was negative, anti-smooth muscle antibodies negative antineutrophil cytoplasmic antibodies negative,Liv/kia type 1 negative. Serum C3 and C4 were negative. I don't see his muscle enzymes specifically. He was referred back to his local rheumatologist for management of his known rheumatoid arthritis.the patient states he still feels weak and fatigued. He states he has numbness in both feet and apparently is known to have neuropathy 05/20/17; the patient continues to have a difficult problem on the right lateral malleolus and the right lateral foot. Both of these wounds have no viable surface even with attempts at debridement. He has a new wound on the left plantar metatarsal head which looks more like a superficial diabetic pressure related injury then part of this underlying issue he has. Most of the rest of the wounds on his legs look satisfactory. Mostly on the right calf.reviewed his arterial studies which showed noncompressible vessels bilaterally but the TBIs were quite normal. 05/27/17; no real improvement in the right lateral malleolus and right lateral foot. In fact the right lateral foot is now on bone. I gave him doxycycline empirically last week it appears that he developed photosensitivity was 55 the son in a tractor. I'll not give him any more of this. This is predominantly  on his face and dorsal forearms and hands. I given this more as an anti- inflammatory. I'm going to have him seen by vascular surgery. His TBIs that he had done previously ordered by Dr. Brigitte Pulse were in the normal range They never did full arterial studies on him. he now has small punched out wounds on the right lateral ankle and right lateral foot. I doubt these are ischemic however I would like a review of his macrovascular status. He does describe some pain at night. I'll  reduce the compression from 3 layer to 2 layer and I'm not convinced that this is a macrovascular issue however I want to make sure. We've been using silver alginate 06/03/17; the patient's x-rays that I ordered last time of his right ankle and right lateral foot did not show definite osteomyelitis. We've been using silver alginate. The wounds are not making any progress. The area on the plantar left first metatarsal head however appears to be better. The patient complains of weakness that is more of the fatigue. He says if he's walking his head will fall onto his chest and that his legs literally gave out on him. He did see neurology in the past however that was at a time where his workup was for temporal arteritis and headaches. 06/10/17; the patient is making no progress with the areas on the right lateral foot and right lateral malleolus. In fact the area on the foot probes to bone. X-ray did not show osteomyelitis. He went and saw vein and vascular on 06/04/17 he was felt to have significant reflux in the left greater saphenous vein over this is not in the area we are most concerned about. He could be offered ablation. He was not felt to have venous reflux noted in the right lower extremity. He was felt to have some degree of lymphedema and he was felt to be a candidate for compression pumps. Finally a diagnostic arteriogram was suggested which is really what I'm most interested in. The patient has wife wanted time to think about this, I think they were confused about interacting between venous and arterial discussions. I think it would be probably well worth going through the angiogram. Culture I did have the deeper area on the right lateral foot showed a few Enterococcus faecalis. I would like to start her on amoxicillin which they will start today for one-week 500 3 times a day Electronic Signature(s) Signed: 06/10/2017 6:13:14 PM By: Linton Ham MD Entered By: Linton Ham on 06/10/2017  12:57:14 Climer, [name] ([id number]) -------------------------------------------------------------------------------- Physical Exam Details Patient Name: [name] Date of Service: 06/10/2017 8:30 AM Medical Record Number: [id number] Patient Account Number: [id number] Date of Birth/Sex: 1940/01/19 (77 y.o. M) Treating RN: Cornell Barman Primary Care Provider: Cyndi Bender Other Clinician: Referring Provider: Cyndi Bender Treating Provider/Extender: Tito Dine in Treatment: 9 Constitutional Patient is hypotensive.however he appears well. Pulse regular and within target range for patient.[name] Respirations regular, non- labored and within target range.. Temperature is normal and within the target range for the patient.[name] appears in no distress. Eyes Conjunctivae clear. No discharge. Respiratory Respiratory effort is easy and symmetric bilaterally. Rate is normal at rest and on room air.. Cardiovascular Pedal pulses are faintly palpable. Integumentary (Hair, Skin) almost all of the punched-out areas he had when he came here on the right anterior and posterior calf of closed. He also had an area on the left anterior foot which is closed.. Notes wound exam; multiple areas on the posterior  and anterior part of his calf remaining closed. The cause of this was never really determined. Now he has punched out areas on the right lateral malleolus right lateral foot. These do not have a viable surface in the area on the foot goes right to bone. Culture I did of this area last week grew a few enterococcus or there is also some erythema of the right forefoot but no tenderness Electronic Signature(s) Signed: 06/10/2017 6:13:14 PM By: Linton Ham MD Entered By: Linton Ham on 06/10/2017 12:55:56 Glanz, [name] ([id number]) -------------------------------------------------------------------------------- Physician Orders Details Patient Name: [name] Date of  Service: 06/10/2017 8:30 AM Medical Record Number: [id number] Patient Account Number: [id number] Date of Birth/Sex: 15-Oct-1940 (77 y.o. M) Treating RN: Cornell Barman Primary Care Provider: Cyndi Bender Other Clinician: Referring Provider: Cyndi Bender Treating Provider/Extender: Tito Dine in Treatment: 9 Verbal / Phone Orders: No Diagnosis Coding Wound Cleansing Wound #6 Right,Lateral Malleolus o Clean wound with Normal Saline. Wound #8 Right,Lateral Foot o Clean wound with Normal Saline. Wound #9 Left Metatarsal head first o Clean wound with Normal Saline. Anesthetic (add to Medication List) Wound #6 Right,Lateral Malleolus o Topical Lidocaine 4% cream applied to wound bed prior to debridement (In Clinic Only). Wound #8 Right,Lateral Foot o Topical Lidocaine 4% cream applied to wound bed prior to debridement (In Clinic Only). Wound #9 Left Metatarsal head first o Topical Lidocaine 4% cream applied to wound bed prior to debridement (In Clinic Only). Primary Wound Dressing Wound #6 Right,Lateral Malleolus o Santyl Ointment Wound #8 Right,Lateral Foot o Santyl Ointment Wound #9 Left Metatarsal head first o Collagen Secondary Dressing Wound #6 Right,Lateral Malleolus o ABD pad Wound #8 Right,Lateral Foot o ABD pad Wound #9 Left Metatarsal head first o Boardered Foam Dressing Dressing Change Frequency Wound #6 Right,Lateral Malleolus o Change Dressing Monday, Wednesday, Friday JACEY, PELC ([id number]) Wound #8 Right,Lateral Foot o Change Dressing Monday, Wednesday, Friday Wound #9 Left Metatarsal head first o Change Dressing Monday, Wednesday, Friday Follow-up Appointments Wound #6 Right,Lateral Malleolus o Return Appointment in 1 week. Wound #8 Right,Lateral Foot o Return Appointment in 1 week. Wound #9 Left Metatarsal head first o Return Appointment in 1 week. Home Health Wound #6 [place] Visits o Home Health Nurse may visit PRN to address patientos wound care needs. o FACE TO FACE ENCOUNTER: MEDICARE and MEDICAID PATIENTS: I certify that this patient is under my care and that I had a face-to-face encounter that meets the physician face-to-face encounter requirements with this patient on this date. The encounter with the patient was in whole or in part for the following MEDICAL CONDITION: (primary reason for [place]) MEDICAL NECESSITY: I certify, that based on my findings, NURSING services are a medically necessary home health service. HOME BOUND STATUS: I certify that my clinical findings support that this patient is homebound (i.e., Due to illness or injury, pt requires aid of supportive devices such as crutches, cane, wheelchairs, walkers, the use of special transportation or the assistance of another person to leave their place of residence. There is a normal inability to leave the home and doing so requires considerable and taxing effort. Other absences are for medical reasons / religious services and are infrequent or of short duration when for other reasons). o If current dressing causes regression in wound condition, may D/C ordered dressing product/s and apply Normal Saline Moist Dressing daily until next [place] / Other MD appointment. [place]  of regression in wound condition at [phone]. o Please direct any NON-WOUND related issues/requests for orders to patient's Primary Care Physician Wound #8 [place] Nurse may visit PRN to address patientos wound care needs. o FACE TO FACE ENCOUNTER: MEDICARE and MEDICAID PATIENTS: I certify that this patient is under my care and that I had a face-to-face encounter that meets the physician face-to-face encounter requirements with this patient on this date. The encounter with the patient was in whole  or in part for the following MEDICAL CONDITION: (primary reason for [place]) MEDICAL NECESSITY: I certify, that based on my findings, NURSING services are a medically necessary home health service. HOME BOUND STATUS: I certify that my clinical findings support that this patient is homebound (i.e., Due to illness or injury, pt requires aid of supportive devices such as crutches, cane, wheelchairs, walkers, the use of special transportation or the assistance of another person to leave their place of residence. There is a normal inability to leave the home and doing so requires considerable and taxing effort. Other absences are for medical reasons / religious services and are infrequent or of short duration when for other reasons). o If current dressing causes regression in wound condition, may D/C ordered dressing product/s and apply Normal Saline Moist Dressing daily until next [place] / Other MD appointment. [place] of regression in wound condition at [phone]. o Please direct any NON-WOUND related issues/requests for orders to patient's Primary Care Physician Wound #9 Left Metatarsal head first o [place] Nurse may visit PRN to address patientos wound care needs. NASIER, THUMM ([id number]) o FACE TO FACE ENCOUNTER: MEDICARE and MEDICAID PATIENTS: I certify that this patient is under my care and that I had a face-to-face encounter that meets the physician face-to-face encounter requirements with this patient on this date. The encounter with the patient was in whole or in part for the following MEDICAL CONDITION: (primary reason for [place]) MEDICAL NECESSITY: I certify, that based on my findings, NURSING services are a medically necessary home health service. HOME BOUND STATUS: I certify that my clinical findings support that this patient is homebound (i.e., Due to illness or injury, pt requires  aid of supportive devices such as crutches, cane, wheelchairs, walkers, the use of special transportation or the assistance of another person to leave their place of residence. There is a normal inability to leave the home and doing so requires considerable and taxing effort. Other absences are for medical reasons / religious services and are infrequent or of short duration when for other reasons). o If current dressing causes regression in wound condition, may D/C ordered dressing product/s and apply Normal Saline Moist Dressing daily until next [place] / Other MD appointment. [place] of regression in wound condition at [phone]. o Please direct any NON-WOUND related issues/requests for orders to patient's Primary Care Physician Medications-please add to medication list. Wound #6 Right,Lateral Malleolus o P.O. Antibiotics - Continue Antibiotics as prescribed. Wound #8 Right,Lateral Foot o P.O. Antibiotics - Continue Antibiotics as prescribed. Wound #9 Left Metatarsal head first o P.O. Antibiotics - Continue Antibiotics as prescribed. Electronic Signature(s) Signed: 06/10/2017 6:13:14 PM By: Linton Ham MD Signed: 06/11/2017 2:15:42 PM By: Gretta Cool, BSN, RN, CWS, Kim RN, BSN Entered By: Gretta Cool, BSN, RN, CWS, Kim on 06/10/2017 09:20:16 Cicio, [name] ([id number]) -------------------------------------------------------------------------------- Problem List Details Patient Name: [name] Date  of Service: 06/10/2017 8:30 AM Medical Record Number: [id number] Patient Account Number: [id number] Date of Birth/Sex: 1940/11/23 (77 y.o. M) Treating RN: Cornell Barman Primary Care Provider: Cyndi Bender Other Clinician: Referring Provider: Cyndi Bender Treating Provider/Extender: Tito Dine in Treatment: 9 Active Problems ICD-10 Impacting Encounter Code Description Active Date Wound Healing Diagnosis L97.521 Non-pressure  chronic ulcer of other part of left foot limited to 04/08/2017 No Yes breakdown of skin L97.211 Non-pressure chronic ulcer of right calf limited to breakdown 04/08/2017 No Yes of skin L97.511 Non-pressure chronic ulcer of other part of right foot limited to 04/08/2017 No Yes breakdown of skin I25.5 Ischemic cardiomyopathy 04/08/2017 No Yes Inactive Problems Resolved Problems Electronic Signature(s) Signed: 06/10/2017 6:13:14 PM By: Linton Ham MD Entered By: Linton Ham on 06/10/2017 12:50:20 Carel, [name] ([id number]) -------------------------------------------------------------------------------- Progress Note Details Patient Name: [name] Date of Service: 06/10/2017 8:30 AM Medical Record Number: [id number] Patient Account Number: [id number] Date of Birth/Sex: April 27, 1940 (77 y.o. M) Treating RN: Cornell Barman Primary Care Provider: Cyndi Bender Other Clinician: Referring Provider: Cyndi Bender Treating Provider/Extender: Tito Dine in Treatment: 9 Subjective History of Present Illness (HPI) 04/08/17; this is a complex 77 year old man referred here from [facility]. He had been referred there for bilateral lower extremity edema with ulcer formation predominantly on the right calf but also the right foot. He had been receiving Unna boots bilaterally. The history here is long. He is not a diabetic however ICU looking through late 2018 he was worked up for chronic headaches, elevated inflammatory markers including C-reactive protein and ESR. He went on to actually have a left temporal artery biopsy wasn't that was negative.he received about 6 weeks of high-dose prednisone 60 mg with improvement in his inflammatory markers. He was admitted to hospital in late November with ventricular tachycardia syncope. He has known ischemic cardiomyopathy. He was admitted in the hospital in mid December. Apparently this was precipitated by a syncopal spell  falling out of his scooter while at [place]. There was ventricular arrhythmia. He has an implantable defibrillator and echocardiogram showed severe LV dysfunction with an EF of 20% and valvular regurgitations including mild AR, moderate MR. There was no stenosis. He ruled in for a non-ST elevation MI in the setting of V. tach.his wife states that sometime during this hospitalization she noted multiple areas of skin change on the right lower calf which became evident just after he left the hospital. He was back in hospital in February with acute renal failure hyponatremia. This responded to fluid resuscitation.. Interestingly I can't see much description of his right leg at that point in time.he was followed by Dr. Nehemiah Massed of dermatology for the necrotic wounds on his right leg. Apparently a biopsy was planned at one point but not done although in some notes that suggests it was. I cannot see these results area He was noted to have a lot of edema. Was treated with bilateral Unna boots edges really helped with the swelling they have been using Bactroban to small open areas predominantly on the right anterior lower leg His history is complicated by the fact that he has rheumatoid arthritis followed by rheumatology. He is followed by neurology for disabling headaches. At one point this was felt to be giant cell arteritis although a left temporal biopsy was apparently negative. He was given a prolonged course of prednisone at 60 mg which managed his sedimentation rates but apparently did not prove improve the headaches. This is been tapered  to off on by rheumatology on 03/13/17 Vascular had plans to do a venous reflux workup as well as arterial studies in May. They also wanted to get him a lymphedema pump. As mentioned he's been using bacitracin under Unna boot wraps to both lower legs 04/15/17; the patient arrives with most of his wounds improved. These are small punched out wounds. Most of them  remaining ones are on the right anterior calf with the most problematic over the right lateral malleolus. There are no new areas. The symptom complex or potential symptom complex we are dealing with his chronic disabling headaches with inflammatory markers not responsive to prednisone and with a negative temporal biopsy, lower extremity weakness, skin ulcerations just on the right leg. We have managed to get his arterial studies moved up to April 30. He has a rheumatology consult at [facility] in June. He has seen dermatology locally, rheumatology locally, neurology locally. 04/22/17; small punched out areas on the right leg anteriorly posteriorly. Most of these appear to have closed over. Some of them have eschar over the surface. The most problematic area appears to be over the right lateral malleolus. We've been using prisma to all of this. He has arterial studies on April 30 and a rheumatology consult at [facility] on June 28 04/29/17; most of the small punched out areas on the right leg posteriorly are closed. He has 2 or 3 openings anteriorly but most of these appear to be on the way to closing. Still problematically over the right lateral malleolus and right lateral foot with almost ischemic-looking eschar. His arterial studies that I ordered are due to be done next week on the 30th so we should have them available for our next visit hopefully. He also saw a rheumatologist at [facility] and according to the patient he did 8 vials of blood. Finally he has scaling rash on his left foot and what looks to be at [facility] area on the left anterior leg 05/06/17; most of the small punched out areas on the right leg posteriorly and anteriorly are closed. He still has one small one Guice, Alton E. ([id number]) anteriorly one over the right lateral malleolus and one over the right lateral foot. He had his arterial studies they didn't seem to do waveform analysis not exactly sure why however in any case is ABIs  were noncompressible bilaterally. They did provide TBIs although looking at the pressures it appears that his TBIs are quite normal. I therefore went ahead and debrided the area over the right lateral malleolus and the right lateral foot 05/13/17; most of the small punched out areas on the right leg posteriorly and anteriorly are closed. He continues to have problematic areas over the right lateral malleolus and the right lateral foot. He still requiring aggressive debridement of these 2 wounds using silver collagen I reviewed the note from rheumatology I don't think they came up with a specific diagnosis although he is known to have seropositive RA. They did a panel of lab work when I was able to see his his AMA was negative, anti-smooth muscle antibodies negative antineutrophil cytoplasmic antibodies negative,Liv/kia type 1 negative. Serum C3 and C4 were negative. I don't see his muscle enzymes specifically. He was referred back to his local rheumatologist for management of his known rheumatoid arthritis.the patient states he still feels weak and fatigued. He states he has numbness in both feet and apparently is known to have neuropathy 05/20/17; the patient continues to have a difficult problem on the right lateral  malleolus and the right lateral foot. Both of these wounds have no viable surface even with attempts at debridement. He has a new wound on the left plantar metatarsal head which looks more like a superficial diabetic pressure related injury then part of this underlying issue he has. Most of the rest of the wounds on his legs look satisfactory. Mostly on the right calf.reviewed his arterial studies which showed noncompressible vessels bilaterally but the TBIs were quite normal. 05/27/17; no real improvement in the right lateral malleolus and right lateral foot. In fact the right lateral foot is now on bone. I gave him doxycycline empirically last week it appears that he developed  photosensitivity was 63 the son in a tractor. I'll not give him any more of this. This is predominantly on his face and dorsal forearms and hands. I given this more as an anti- inflammatory. I'm going to have him seen by vascular surgery. His TBIs that he had done previously ordered by Dr. Brigitte Pulse were in the normal range They never did full arterial studies on him. he now has small punched out wounds on the right lateral ankle and right lateral foot. I doubt these are ischemic however I would like a review of his macrovascular status. He does describe some pain at night. I'll reduce the compression from 3 layer to 2 layer and I'm not convinced that this is a macrovascular issue however I want to make sure. We've been using silver alginate 06/03/17; the patient's x-rays that I ordered last time of his right ankle and right lateral foot did not show definite osteomyelitis. We've been using silver alginate. The wounds are not making any progress. The area on the plantar left first metatarsal head however appears to be better. The patient complains of weakness that is more of the fatigue. He says if he's walking his head will fall onto his chest and that his legs literally gave out on him. He did see neurology in the past however that was at a time where his workup was for temporal arteritis and headaches. 06/10/17; the patient is making no progress with the areas on the right lateral foot and right lateral malleolus. In fact the area on the foot probes to bone. X-ray did not show osteomyelitis. He went and saw vein and vascular on 06/04/17 he was felt to have significant reflux in the left greater saphenous vein over this is not in the area we are most concerned about. He could be offered ablation. He was not felt to have venous reflux noted in the right lower extremity. He was felt to have some degree of lymphedema and he was felt to be a candidate for compression pumps. Finally a diagnostic arteriogram  was suggested which is really what I'm most interested in. The patient has wife wanted time to think about this, I think they were confused about interacting between venous and arterial discussions. I think it would be probably well worth going through the angiogram. Culture I did have the deeper area on the right lateral foot showed a few Enterococcus faecalis. I would like to start her on amoxicillin which they will start today for one-week 500 3 times a day Objective Constitutional Patient is hypotensive.however he appears well. Pulse regular and within target range for patient.[name] Respirations regular, non- labored and within target range.. Temperature is normal and within the target range for the patient.[name] appears in no distress. ARLENE, GENOVA ([id number]) Vitals Time Taken: 8:44 AM, Height: 68 in, Temperature: 97.6  F, Pulse: 66 bpm, Respiratory Rate: 18 breaths/min, Blood Pressure: 90/68 mmHg. General Notes: Took BP by dinamap and it was 87/46 and then I took it manually and it was 90/68. Made MD aware of same. Eyes Conjunctivae clear. No discharge. Respiratory Respiratory effort is easy and symmetric bilaterally. Rate is normal at rest and on room air.. Cardiovascular Pedal pulses are faintly palpable. General Notes: wound exam; multiple areas on the posterior and anterior part of his calf remaining closed. The cause of this was never really determined. Now he has punched out areas on the right lateral malleolus right lateral foot. These do not have a viable surface in the area on the foot goes right to bone. Culture I did of this area last week grew a few enterococcus or there is also some erythema of the right forefoot but no tenderness Integumentary (Hair, Skin) almost all of the punched-out areas he had when he came here on the right anterior and posterior calf of closed. He also had an area on the left anterior foot which is closed.. Wound #6 status is Open. Original cause  of wound was Gradually Appeared. The wound is located on the Right,Lateral Malleolus. The wound measures 0.7cm length x 0.4cm width x 0.4cm depth; 0.22cm^2 area and 0.088cm^3 volume. There is no tunneling or undermining noted. There is a large amount of serous drainage noted. The wound margin is distinct with the outline attached to the wound base. There is large (67-100%) red, pink granulation within the wound bed. There is a small (1-33%) amount of necrotic tissue within the wound bed including Eschar and Adherent Slough. The periwound skin appearance exhibited: Callus. The periwound skin appearance did not exhibit: Crepitus, Excoriation, Induration, Rash, Scarring, Dry/Scaly, Maceration, Atrophie Blanche, Cyanosis, Ecchymosis, Hemosiderin Staining, Mottled, Pallor, Rubor, Erythema. Periwound temperature was noted as No Abnormality. The periwound has tenderness on palpation. Wound #8 status is Open. Original cause of wound was Gradually Appeared. The wound is located on the Right,Lateral Foot. The wound measures 0.7cm length x 0.8cm width x 0.2cm depth; 0.44cm^2 area and 0.088cm^3 volume. There is bone and Fat Layer (Subcutaneous Tissue) Exposed exposed. There is no tunneling or undermining noted. There is a large amount of serous drainage noted. The wound margin is distinct with the outline attached to the wound base. There is small (1-33%) red, pink granulation within the wound bed. There is a large (67-100%) amount of necrotic tissue within the wound bed including Adherent Slough. The periwound skin appearance exhibited: Callus, Maceration. Periwound temperature was noted as No Abnormality. The periwound has tenderness on palpation. Wound #9 status is Open. Original cause of wound was Gradually Appeared. The wound is located on the Left Metatarsal head first. The wound measures 0.4cm length x 0.4cm width x 0.1cm depth; 0.126cm^2 area and 0.013cm^3 volume. There is Fat Layer (Subcutaneous  Tissue) Exposed exposed. There is no tunneling or undermining noted. There is a large amount of serous drainage noted. The wound margin is flat and intact. There is no granulation within the wound bed. There is a large (67-100%) amount of necrotic tissue within the wound bed including Eschar and Adherent Slough. The periwound skin appearance exhibited: Maceration. The periwound skin appearance did not exhibit: Callus, Crepitus, Excoriation, Induration, Rash, Scarring, Dry/Scaly, Atrophie Blanche, Cyanosis, Ecchymosis, Hemosiderin Staining, Mottled, Pallor, Rubor, Erythema. Periwound temperature was noted as No Abnormality. Assessment Active Problems ICD-10 Non-pressure chronic ulcer of other part of left foot limited to breakdown of skin Vandevoort, Hershal E. 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 Wound #8 Right,Lateral Foot: Clean wound with Normal Saline. Wound #9 Left Metatarsal head first: Clean wound with Normal Saline. Anesthetic (add to Medication List): Wound #6 Right,Lateral Malleolus: Topical Lidocaine 4% cream applied to wound bed prior to debridement (In Clinic Only). Wound #8 Right,Lateral Foot: Topical Lidocaine 4% cream applied to wound bed prior to debridement (In Clinic Only). Wound #9 Left Metatarsal head first: Topical Lidocaine 4% cream applied to wound bed prior to debridement (In Clinic Only). Primary Wound Dressing: Wound #6 Right,Lateral Malleolus: Santyl Ointment Wound #8 Right,Lateral Foot: Santyl Ointment Wound #9 Left Metatarsal head first: Collagen Secondary Dressing: Wound #6 Right,Lateral Malleolus: ABD pad Wound #8 Right,Lateral Foot: ABD pad Wound #9 Left Metatarsal head first: Boardered Foam Dressing Dressing  Change Frequency: Wound #6 Right,Lateral Malleolus: Change Dressing Monday, Wednesday, Friday Wound #8 Right,Lateral Foot: Change Dressing Monday, Wednesday, Friday Wound #9 Left Metatarsal head first: Change Dressing Monday, Wednesday, Friday Follow-up Appointments: Wound #6 Right,Lateral Malleolus: Return Appointment in 1 week. Wound #8 Right,Lateral Foot: Return Appointment in 1 week. Wound #9 Left Metatarsal head first: Return Appointment in 1 week. Home Health: Wound #6 Right,Lateral Malleolus: [place] Nurse may visit PRN to address patient s wound care needs. FACE TO FACE ENCOUNTER: MEDICARE and MEDICAID PATIENTS: I certify that this patient is under my care and that I had a face-to-face encounter that meets the physician face-to-face encounter requirements with this patient on this date. The ANIVAL, PASHA ([id number]) encounter with the patient was in whole or in part for the following MEDICAL CONDITION: (primary reason for [place]) MEDICAL NECESSITY: I certify, that based on my findings, NURSING services are a medically necessary home health service. HOME BOUND STATUS: I certify that my clinical findings support that this patient is homebound (i.e., Due to illness or injury, pt requires aid of supportive devices such as crutches, cane, wheelchairs, walkers, the use of special transportation or the assistance of another person to leave their place of residence. There is a normal inability to leave the home and doing so requires considerable and taxing effort. Other absences are for medical reasons / religious services and are infrequent or of short duration when for other reasons). If current dressing causes regression in wound condition, may D/C ordered dressing product/s and apply Normal Saline Moist Dressing daily until next [place] / Other MD appointment. [place] of regression in wound condition at  [phone]. Please direct any NON-WOUND related issues/requests for orders to patient's Primary Care Physician Wound #8 Right,Lateral Foot: [place] Nurse may visit PRN to address patient s wound care needs. FACE TO FACE ENCOUNTER: MEDICARE and MEDICAID PATIENTS: I certify that this patient is under my care and that I had a face-to-face encounter that meets the physician face-to-face encounter requirements with this patient on this date. The encounter with the patient was in whole or in part for the following MEDICAL CONDITION: (primary reason for [place]) MEDICAL NECESSITY: I certify, that based on my findings, NURSING services are a medically necessary home health service. HOME BOUND STATUS: I certify that my clinical findings support that this patient is homebound (i.e., Due to illness or injury, pt requires aid of supportive devices such as crutches, cane, wheelchairs,  walkers, the use of special transportation or the assistance of another person to leave their place of residence. There is a normal inability to leave the home and doing so requires considerable and taxing effort. Other absences are for medical reasons / religious services and are infrequent or of short duration when for other reasons). If current dressing causes regression in wound condition, may D/C ordered dressing product/s and apply Normal Saline Moist Dressing daily until next [place] / Other MD appointment. [place] of regression in wound condition at [phone]. Please direct any NON-WOUND related issues/requests for orders to patient's Primary Care Physician Wound #9 Left Metatarsal head first: [place] Nurse may visit PRN to address patient s wound care needs. FACE TO FACE ENCOUNTER: MEDICARE and MEDICAID PATIENTS: I certify that this patient is under my care and that I had a face-to-face encounter that meets  the physician face-to-face encounter requirements with this patient on this date. The encounter with the patient was in whole or in part for the following MEDICAL CONDITION: (primary reason for [place]) MEDICAL NECESSITY: I certify, that based on my findings, NURSING services are a medically necessary home health service. HOME BOUND STATUS: I certify that my clinical findings support that this patient is homebound (i.e., Due to illness or injury, pt requires aid of supportive devices such as crutches, cane, wheelchairs, walkers, the use of special transportation or the assistance of another person to leave their place of residence. There is a normal inability to leave the home and doing so requires considerable and taxing effort. Other absences are for medical reasons / religious services and are infrequent or of short duration when for other reasons). If current dressing causes regression in wound condition, may D/C ordered dressing product/s and apply Normal Saline Moist Dressing daily until next [place] / Other MD appointment. [place] of regression in wound condition at [phone]. Please direct any NON-WOUND related issues/requests for orders to patient's Primary Care Physician Medications-please add to medication list.: Wound #6 Right,Lateral Malleolus: P.O. Antibiotics - Continue Antibiotics as prescribed. Wound #8 Right,Lateral Foot: P.O. Antibiotics - Continue Antibiotics as prescribed. Wound #9 Left Metatarsal head first: P.O. Antibiotics - Continue Antibiotics as prescribed. #1 I'm going to continue with Santyl to the right lateral malleolus right lateral footline to college into the superficial area on the left first metatarsal head #3 and it is reasonable for the patient to go angiography to exclude any macrovascular disease in the right thick could be contributing to the nonhealing deep areas that I am describing. I think the patient  probably has small vessel disease/vasculopathy or maybe vasculitis but we've not been able to identify the root cause of this. Nevertheless I think Brakebill, Zaylon E. ([id number]) excluding a macrovascular disease that might be correctable to help the patient healed the wounds on the right ankle and foot is worse any risk of the procedure #4 starting amoxicillin that I prescribed actually 2 days ago today. We could not get a hold of the patient to let him know that there was a prescription Electronic Signature(s) Signed: 06/10/2017 6:13:14 PM By: Linton Ham MD Entered By: Linton Ham on 06/10/2017 12:58:55 Stofko, [name] ([id number]) -------------------------------------------------------------------------------- [place] Details Patient Name: [name] Date of Service: 06/10/2017 Medical Record Number: [id number] Patient Account Number: [id number] Date of Birth/Sex: 1940-10-31 (77 y.o. M) Treating RN: Cornell Barman Primary Care Provider: Cyndi Bender Other Clinician: Referring Provider: Cyndi Bender Treating  Provider/Extender: Ricard Dillon Weeks in Treatment: 9 Diagnosis Coding ICD-10 Codes Code Description L97.521 Non-pressure chronic ulcer of other part of left foot limited to breakdown of skin L97.211 Non-pressure chronic ulcer of right calf limited to breakdown of skin L97.511 Non-pressure chronic ulcer of other part of right foot limited to breakdown of skin I25.5 Ischemic cardiomyopathy Facility Procedures CPT4 Code: [id number] Description: 99214 - WOUND CARE VISIT-LEV 4 EST PT Modifier: Quantity: 1 Physician Procedures CPT4 Code Description: [id number] - WC PHYS LEVEL 3 - EST PT ICD-10 Diagnosis Description L97.511 Non-pressure chronic ulcer of other part of right foot limited L97.211 Non-pressure chronic ulcer of right calf limited to breakdown o Modifier: to breakdown of f skin Quantity: 1 skin Electronic Signature(s) Signed: 06/10/2017 6:13:14 PM  By: Linton Ham MD Entered By: Linton Ham on 06/10/2017 12:59:30

## 2017-06-12 NOTE — Progress Notes (Signed)
KHRIS, JANSSON ([id number]) Visit Report for 06/10/2017 Arrival Information Details Patient Name: KINNEY, SACKMANN Date of Service: 06/10/2017 8:30 AM Medical Record Number: [id number] Patient Account Number: [id number] Date of Birth/Sex: 16-Dec-1940 (76 y.o. M) Treating RN: Ahmed Prima Primary Care Gennie Eisinger: Cyndi Bender Other Clinician: Referring Enoc Getter: Cyndi Bender Treating Nykeem Citro/Extender: Tito Dine in Treatment: 9 Visit Information History Since Last Visit All ordered tests and consults were completed: No Patient Arrived: Cane Added or deleted any medications: No Arrival Time: 08:43 Any new allergies or adverse reactions: No Accompanied By: spouse Had a fall or experienced change in No Transfer Assistance: None activities of daily living that may affect Patient Identification Verified: Yes risk of falls: Secondary Verification Process Completed: Yes Signs or symptoms of abuse/neglect since last visito No Patient Requires Transmission-Based No Hospitalized since last visit: No Precautions: Implantable device outside of the clinic excluding No Patient Has Alerts: Yes cellular tissue based products placed in the center Patient Alerts: R ABI since last visit: >220 Has Dressing in Place as Prescribed: Yes Pain Present Now: No Electronic Signature(s) Signed: 06/11/2017 2:43:37 PM By: Alric Quan Entered By: Alric Quan on 06/10/2017 08:44:30 Howley, [name] ([id number]) -------------------------------------------------------------------------------- Clinic Level of Care Assessment Details Patient Name: [name] Date of Service: 06/10/2017 8:30 AM Medical Record Number: [id number] Patient Account Number: [id number] Date of Birth/Sex: May 25, 1940 (76 y.o. M) Treating RN: Cornell Barman Primary Care Markan Cazarez: Cyndi Bender Other Clinician: Referring Issai Werling: Cyndi Bender Treating Sheana Bir/Extender: Tito Dine in  Treatment: 9 Clinic Level of Care Assessment Items TOOL 4 Quantity Score []  - Use when only an EandM is performed on FOLLOW-UP visit 0 ASSESSMENTS - Nursing Assessment / Reassessment X - Reassessment of Co-morbidities (includes updates in patient status) 1 10 X- 1 5 Reassessment of Adherence to Treatment Plan ASSESSMENTS - Wound and Skin Assessment / Reassessment []  - Simple Wound Assessment / Reassessment - one wound 0 X- 3 5 Complex Wound Assessment / Reassessment - multiple wounds []  - 0 Dermatologic / Skin Assessment (not related to wound area) ASSESSMENTS - Focused Assessment []  - Circumferential Edema Measurements - multi extremities 0 []  - 0 Nutritional Assessment / Counseling / Intervention []  - 0 Lower Extremity Assessment (monofilament, tuning fork, pulses) []  - 0 Peripheral Arterial Disease Assessment (using hand held doppler) ASSESSMENTS - Ostomy and/or Continence Assessment and Care []  - Incontinence Assessment and Management 0 []  - 0 Ostomy Care Assessment and Management (repouching, etc.) PROCESS - Coordination of Care X - Simple Patient / Family Education for ongoing care 1 15 []  - 0 Complex (extensive) Patient / Family Education for ongoing care X- 1 10 Staff obtains Programmer, systems, Records, Test Results / Process Orders []  - 0 Staff telephones HHA, Nursing Homes / Clarify orders / etc []  - 0 Routine Transfer to another Facility (non-emergent condition) []  - 0 Routine Hospital Admission (non-emergent condition) []  - 0 New Admissions / Biomedical engineer / Ordering NPWT, Apligraf, etc. []  - 0 Emergency Hospital Admission (emergent condition) X- 1 10 Simple Discharge Coordination Lewellen, FORTUNATO NORDIN. ([id number]) []  - 0 Complex (extensive) Discharge Coordination PROCESS - Special Needs []  - Pediatric / Minor Patient Management 0 []  - 0 Isolation Patient Management []  - 0 Hearing / Language / Visual special needs []  - 0 Assessment of Community  assistance (transportation, D/C planning, etc.) []  - 0 Additional assistance / Altered mentation []  - 0 Support Surface(s) Assessment (bed, cushion, seat, etc.) INTERVENTIONS - Wound Cleansing / Measurement []  - Simple  Wound Cleansing - one wound 0 X- 3 5 Complex Wound Cleansing - multiple wounds X- 1 5 Wound Imaging (photographs - any number of wounds) []  - 0 Wound Tracing (instead of photographs) []  - 0 Simple Wound Measurement - one wound X- 3 5 Complex Wound Measurement - multiple wounds INTERVENTIONS - Wound Dressings []  - Small Wound Dressing one or multiple wounds 0 X- 1 15 Medium Wound Dressing one or multiple wounds X- 1 20 Large Wound Dressing one or multiple wounds []  - 0 Application of Medications - topical []  - 0 Application of Medications - injection INTERVENTIONS - Miscellaneous []  - External ear exam 0 []  - 0 Specimen Collection (cultures, biopsies, blood, body fluids, etc.) []  - 0 Specimen(s) / Culture(s) sent or taken to Lab for analysis []  - 0 Patient Transfer (multiple staff / Civil Service fast streamer / Similar devices) []  - 0 Simple Staple / Suture removal (25 or less) []  - 0 Complex Staple / Suture removal (26 or more) []  - 0 Hypo / Hyperglycemic Management (close monitor of Blood Glucose) []  - 0 Ankle / Brachial Index (ABI) - do not check if billed separately X- 1 5 Vital Signs Seng, Ah E. ([id number]) Has the patient been seen at the hospital within the last three years: Yes Total Score: 140 Level Of Care: New/Established - Level 4 Electronic Signature(s) Signed: 06/11/2017 2:15:42 PM By: Gretta Cool, BSN, RN, CWS, Kim RN, BSN Entered By: Gretta Cool, BSN, RN, CWS, Kim on 06/10/2017 09:21:09 Hallums, [name] ([id number]) -------------------------------------------------------------------------------- Encounter Discharge Information Details Patient Name: [name] Date of Service: 06/10/2017 8:30 AM Medical Record Number: [id number] Patient Account  Number: [id number] Date of Birth/Sex: 06-Feb-1940 (76 y.o. M) Treating RN: Montey Hora Primary Care Timica Marcom: Cyndi Bender Other Clinician: Referring Iyonna Rish: Cyndi Bender Treating Jsean Taussig/Extender: Tito Dine in Treatment: 9 Encounter Discharge Information Items Discharge Condition: Stable Ambulatory Status: Cane Discharge Destination: Home Transportation: Private Auto Accompanied By: spouse Schedule Follow-up Appointment: Yes Clinical Summary of Care: Electronic Signature(s) Signed: 06/10/2017 12:00:28 PM By: Montey Hora Entered By: Montey Hora on 06/10/2017 12:00:27 [place], [name] ([id number]) -------------------------------------------------------------------------------- Lower Extremity Assessment Details Patient Name: [name] Date of Service: 06/10/2017 8:30 AM Medical Record Number: [id number] Patient Account Number: [id number] Date of Birth/Sex: Dec 01, 1940 (76 y.o. M) Treating RN: Ahmed Prima Primary Care Yuette Putnam: Cyndi Bender Other Clinician: Referring Graylon Amory: Cyndi Bender Treating Tomeika Weinmann/Extender: Tito Dine in Treatment: 9 Edema Assessment Assessed: [Left: No] [Right: No] [Left: Edema] [Right: :] Calf Left: Right: Point of Measurement: 27 cm From Medial Instep 30.5 cm 28.3 cm Ankle Left: Right: Point of Measurement: 11 cm From Medial Instep 24.6 cm 20.8 cm Vascular Assessment Pulses: Dorsalis Pedis Palpable: [Left:Yes] [Right:Yes] Posterior Tibial Extremity colors, hair growth, and conditions: Extremity Color: [Left:Normal] [Right:Hyperpigmented] Temperature of Extremity: [Left:Warm] [Right:Cool] Capillary Refill: [Left:> 3 seconds] [Right:> 3 seconds] Toe Nail Assessment Left: Right: Thick: Yes Yes Discolored: Yes Yes Deformed: Yes Yes Improper Length and Hygiene: Yes Yes Electronic Signature(s) Signed: 06/11/2017 2:43:37 PM By: Alric Quan Entered By: Alric Quan on 06/10/2017  08:56:49 [place], [name] ([id number]) -------------------------------------------------------------------------------- Multi Wound Chart Details Patient Name: [name] Date of Service: 06/10/2017 8:30 AM Medical Record Number: [id number] Patient Account Number: [id number] Date of Birth/Sex: February 24, 1940 (76 y.o. M) Treating RN: Cornell Barman Primary Care Sevastian Witczak: Cyndi Bender Other Clinician: Referring [place] Silveria: Cyndi Bender Treating Tyneka Scafidi/Extender: Tito Dine in Treatment: 9 Vital Signs Height(in): 68 Pulse(bpm): 66 Weight(lbs): Blood Pressure(mmHg): 90/68 Body Mass Index(BMI): Temperature(F):  97.6 Respiratory Rate 18 (breaths/min): Photos: [6:No Photos] [8:No Photos] [9:No Photos] Wound Location: [6:Right, Lateral Malleolus] [8:Right Foot - Lateral] [9:Left Metatarsal head first] Wounding Event: [6:Gradually Appeared] [8:Gradually Appeared] [9:Gradually Appeared] Primary Etiology: [6:Arterial Insufficiency Ulcer] [8:Arterial Insufficiency Ulcer] [9:Pressure Ulcer] Comorbid History: [6:Congestive Heart Failure, Coronary Artery Disease, Hypertension, Myocardial Infarction, Osteoarthritis] [8:Congestive Heart Failure, Coronary Artery Disease, Hypertension, Myocardial Infarction, Osteoarthritis] [9:Congestive Heart  Failure, Coronary Artery Disease, Hypertension, Myocardial Infarction, Osteoarthritis] Date Acquired: [6:03/23/2017] [8:04/29/2017] [9:05/06/2017] Weeks of Treatment: [6:9] [8:6] [9:3] Wound Status: [6:Open] [8:Open] [9:Open] Measurements L x W x D [6:0.7x0.4x0.4] [8:0.7x0.8x0.2] [9:0.4x0.4x0.1] (cm) Area (cm) : [6:0.22] [8:0.44] [9:0.126] Volume (cm) : [6:0.088] [8:0.088] [9:0.013] % Reduction in Area: [6:-609.70%] [8:-519.70%] [9:35.70%] % Reduction in Volume: [6:-2833.30%] [8:-1157.10%] [9:35.00%] Classification: [6:Full Thickness Without Exposed Support Structures] [8:Full Thickness With Exposed Support Structures] [9:Category/Stage  II] Exudate Amount: [6:Large] [8:Large] [9:Large] Exudate Type: [6:Serous] [8:Serous] [9:Serous] Exudate Color: [6:amber] [8:amber] [9:amber] Wound Margin: [6:Distinct, outline attached] [8:Distinct, outline attached] [9:Flat and Intact] Granulation Amount: [6:Large (67-100%)] [8:Small (1-33%)] [9:None Present (0%)] Granulation Quality: [6:Red, Pink] [8:Red, Pink] [9:N/A] Necrotic Amount: [6:Small (1-33%)] [8:Large (67-100%)] [9:Large (67-100%)] Necrotic Tissue: [6:Eschar, Adherent Slough] [8:Adherent Slough] [9:Eschar, Adherent Slough] Exposed Structures: [6:Fascia: No Fat Layer (Subcutaneous Tissue) Exposed: No Tendon: No Muscle: No Joint: No Bone: No] [8:Fat Layer (Subcutaneous Tissue) Exposed: Yes Bone: Yes Fascia: No Tendon: No Muscle: No Joint: No] [9:Fat Layer (Subcutaneous Tissue) Exposed: Yes  Fascia: No Tendon: No Muscle: No Joint: No Bone: No] Epithelialization: [6:None] [8:None] [9:None] Periwound Skin Texture: [6:Callus: Yes Excoriation: No] [8:Callus: Yes] [9:Excoriation: No Induration: No] Induration: No Callus: No Crepitus: No Crepitus: No Rash: No Rash: No Scarring: No Scarring: No Periwound Skin Moisture: Maceration: No Maceration: Yes Maceration: Yes Dry/Scaly: No Dry/Scaly: No Periwound Skin Color: Atrophie Blanche: No No Abnormalities Noted Atrophie Blanche: No Cyanosis: No Cyanosis: No Ecchymosis: No Ecchymosis: No Erythema: No Erythema: No Hemosiderin Staining: No Hemosiderin Staining: No Mottled: No Mottled: No Pallor: No Pallor: No Rubor: No Rubor: No Temperature: No Abnormality No Abnormality No Abnormality Tenderness on Palpation: Yes Yes No Wound Preparation: Ulcer Cleansing: Ulcer Cleansing: Ulcer Cleansing: Rinsed/Irrigated with Saline, Rinsed/Irrigated with Saline, Rinsed/Irrigated with Saline Other: soap and water Other: soap and water Topical Anesthetic Applied: Topical Anesthetic Applied: Topical Anesthetic Applied: Other:  lidocaine 4% Other: lidocaine 4% Other: lidocaine 4% Treatment Notes Wound #6 (Right, Lateral Malleolus) 1. Cleansed with: Clean wound with Normal Saline Cleanse wound with antibacterial soap and water 2. Anesthetic Topical Lidocaine 4% cream to wound bed prior to debridement 3. Peri-wound Care: Other peri-wound care (specify in notes) 4. Dressing Applied: Santyl Ointment 5. Secondary Dressing Applied ABD Pad 7. Secured with Other (specify in notes) Notes TCA, kerlix/coban wrap Wound #8 (Right, Lateral Foot) 1. Cleansed with: Clean wound with Normal Saline Cleanse wound with antibacterial soap and water 2. Anesthetic Topical Lidocaine 4% cream to wound bed prior to debridement 3. Peri-wound Care: Other peri-wound care (specify in notes) 4. Dressing Applied: Santyl Ointment 5. Secondary Dressing Applied ABD Pad 7. Secured with ALP, GOLDWATER ([id number]) Other (specify in notes) Notes TCA, kerlix/coban wrap Wound #9 (Left Metatarsal head first) 1. Cleansed with: Clean wound with Normal Saline 2. Anesthetic Topical Lidocaine 4% cream to wound bed prior to debridement 4. Dressing Applied: Promogran 5. Secondary Dressing Applied Bordered Foam Dressing Electronic Signature(s) Signed: 06/10/2017 6:13:14 PM By: Linton Ham MD Entered By: Linton Ham on 06/10/2017 12:50:31 [place], [name] ([id number]) -------------------------------------------------------------------------------- Multi-Disciplinary Care Plan Details Patient Name: AVEON, COLQUHOUN.  Date of Service: 06/10/2017 8:30 AM Medical Record Number: [id number] Patient Account Number: [id number] Date of Birth/Sex: [birth year] (76 y.o. M) Treating RN: Cornell Barman Primary Care Izabelle Daus: Cyndi Bender Other Clinician: Referring Aquanetta Schwarz: Cyndi Bender Treating Naama Sappington/Extender: Tito Dine in Treatment: 9 Active Inactive ` Abuse / Safety / Falls / Self Care Management Nursing  Diagnoses: History of Falls Goals: Patient will remain injury free related to falls Date Initiated: 04/08/2017 Target Resolution Date: 05/08/2017 Goal Status: Active Interventions: Assess fall risk on admission and as needed Notes: ` Orientation to the Wound Care Program Nursing Diagnoses: Knowledge deficit related to the wound healing center program Goals: Patient/caregiver will verbalize understanding of the [place] Program Date Initiated: 04/08/2017 Target Resolution Date: 05/08/2017 Goal Status: Active Interventions: Provide education on orientation to the wound center Notes: ` Soft Tissue Infection Nursing Diagnoses: Impaired tissue integrity Potential for infection: soft tissue Goals: Patient will remain free of wound infection Date Initiated: 04/08/2017 Target Resolution Date: 05/08/2017 Goal Status: Active ABRAN, GAVIGAN ([id number]) Interventions: Assess signs and symptoms of infection every visit Notes: ` Wound/Skin Impairment Nursing Diagnoses: Impaired tissue integrity Goals: Ulcer/skin breakdown will heal within 14 weeks Date Initiated: 04/08/2017 Target Resolution Date: 07/20/2017 Goal Status: Active Interventions: Assess patient/caregiver ability to perform ulcer/skin care regimen upon admission and as needed Provide education on ulcer and skin care Treatment Activities: Topical wound management initiated : 04/08/2017 Notes: Electronic Signature(s) Signed: 06/11/2017 2:15:42 PM By: Gretta Cool, BSN, RN, CWS, Kim RN, BSN Entered By: Gretta Cool, BSN, RN, CWS, Kim on 06/10/2017 09:10:41 [place], [name] ([id number]) -------------------------------------------------------------------------------- Pain Assessment Details Patient Name: [name] Date of Service: 06/10/2017 8:30 AM Medical Record Number: [id number] Patient Account Number: [id number] Date of Birth/Sex: Oct 09, 1940 (76 y.o. M) Treating RN: Ahmed Prima Primary Care Amos Gaber: Cyndi Bender Other Clinician: Referring Belkis Norbeck: Cyndi Bender Treating Maddisyn Hegwood/Extender: Tito Dine in Treatment: 9 Active Problems Location of Pain Severity and Description of Pain Patient Has Paino No Site Locations Pain Management and Medication Current Pain Management: Electronic Signature(s) Signed: 06/11/2017 2:43:37 PM By: Alric Quan Entered By: Alric Quan on 06/10/2017 08:44:37 [place], [name] ([id number]) -------------------------------------------------------------------------------- Patient/Caregiver Education Details Patient Name: [name] Date of Service: 06/10/2017 8:30 AM Medical Record Number: [id number] Patient Account Number: [id number] Date of Birth/Gender: [birth year] (76 y.o. M) Treating RN: Montey Hora Primary Care Physician: Cyndi Bender Other Clinician: Referring Physician: Cyndi Bender Treating Physician/Extender: Tito Dine in Treatment: 9 Education Assessment Education Provided To: Patient and Caregiver Education Topics Provided Venous: Handouts: Other: compression daily Methods: Explain/Verbal Responses: State content correctly Electronic Signature(s) Signed: 06/10/2017 5:11:38 PM By: Montey Hora Entered By: Montey Hora on 06/10/2017 12:00:50 [place], [name] ([id number]) -------------------------------------------------------------------------------- Wound Assessment Details Patient Name: [name] Date of Service: 06/10/2017 8:30 AM Medical Record Number: [id number] Patient Account Number: [id number] Date of Birth/Sex: 1940-04-09 (76 y.o. M) Treating RN: Ahmed Prima Primary Care Blayre Papania: Cyndi Bender Other Clinician: Referring Toren Tucholski: Cyndi Bender Treating Diamante Truszkowski/Extender: Tito Dine in Treatment: 9 Wound Status Wound Number: 6 Primary Arterial Insufficiency Ulcer Etiology: Wound Location: Right, Lateral Malleolus Wound Open Wounding Event:  Gradually Appeared Status: Date Acquired: 03/23/2017 Comorbid Congestive Heart Failure, Coronary Artery Weeks Of Treatment: 9 History: Disease, Hypertension, Myocardial Infarction, Clustered Wound: No Osteoarthritis Photos Photo Uploaded By: Alric Quan on 06/11/2017 08:00:42 Wound Measurements Length: (cm) 0.7 Width: (cm) 0.4 Depth: (cm) 0.4 Area: (cm) 0.22 Volume: (cm) 0.088 % Reduction in Area: -609.7% % Reduction in Volume: -2833.3%  Epithelialization: None Tunneling: No Undermining: No Wound Description Full Thickness Without Exposed Support Classification: Structures Wound Margin: Distinct, outline attached Exudate Large Amount: Exudate Type: Serous Exudate Color: amber Foul Odor After Cleansing: No Slough/Fibrino Yes Wound Bed Granulation Amount: Large (67-100%) Exposed Structure Granulation Quality: Red, Pink Fascia Exposed: No Necrotic Amount: Small (1-33%) Fat Layer (Subcutaneous Tissue) Exposed: No Necrotic Quality: Eschar, Adherent Slough Tendon Exposed: No Muscle Exposed: No Joint Exposed: No Bone Exposed: No Periwound Skin Texture Texture Color Yonan, Javel E. ([id number]) No Abnormalities Noted: No No Abnormalities Noted: No Callus: Yes Atrophie Blanche: No Crepitus: No Cyanosis: No Excoriation: No Ecchymosis: No Induration: No Erythema: No Rash: No Hemosiderin Staining: No Scarring: No Mottled: No Pallor: No Moisture Rubor: No No Abnormalities Noted: No Dry / Scaly: No Temperature / Pain Maceration: No Temperature: No Abnormality Tenderness on Palpation: Yes Wound Preparation Ulcer Cleansing: Rinsed/Irrigated with Saline, Other: soap and water, Topical Anesthetic Applied: Other: lidocaine 4%, Treatment Notes Wound #6 (Right, Lateral Malleolus) 1. Cleansed with: Clean wound with Normal Saline Cleanse wound with antibacterial soap and water 2. Anesthetic Topical Lidocaine 4% cream to wound bed prior to  debridement 3. Peri-wound Care: Other peri-wound care (specify in notes) 4. Dressing Applied: Santyl Ointment 5. Secondary Dressing Applied ABD Pad 7. Secured with Other (specify in notes) Notes TCA, kerlix/coban wrap Electronic Signature(s) Signed: 06/11/2017 2:43:37 PM By: Alric Quan Entered By: Alric Quan on 06/10/2017 09:06:27 [place], [name] ([id number]) -------------------------------------------------------------------------------- Wound Assessment Details Patient Name: [name] Date of Service: 06/10/2017 8:30 AM Medical Record Number: [id number] Patient Account Number: [id number] Date of Birth/Sex: [birth year] (76 y.o. M) Treating RN: Ahmed Prima Primary Care Dorcas Melito: Cyndi Bender Other Clinician: Referring Jeremaih Klima: Cyndi Bender Treating Anela Bensman/Extender: Tito Dine in Treatment: 9 Wound Status Wound Number: 8 Primary Arterial Insufficiency Ulcer Etiology: Wound Location: Right Foot - Lateral Wound Open Wounding Event: Gradually Appeared Status: Date Acquired: 04/29/2017 Comorbid Congestive Heart Failure, Coronary Artery Weeks Of Treatment: 6 History: Disease, Hypertension, Myocardial Infarction, Clustered Wound: No Osteoarthritis Photos Photo Uploaded By: Alric Quan on 06/11/2017 08:00:42 Wound Measurements Length: (cm) 0.7 Width: (cm) 0.8 Depth: (cm) 0.2 Area: (cm) 0.44 Volume: (cm) 0.088 % Reduction in Area: -519.7% % Reduction in Volume: -1157.1% Epithelialization: None Tunneling: No Undermining: No Wound Description Full Thickness With Exposed Support Classification: Structures Wound Margin: Distinct, outline attached Exudate Large Amount: Exudate Type: Serous Exudate Color: amber Foul Odor After Cleansing: No Slough/Fibrino Yes Wound Bed Granulation Amount: Small (1-33%) Exposed Structure Granulation Quality: Red, Pink Fascia Exposed: No Necrotic Amount: Large (67-100%) Fat Layer  (Subcutaneous Tissue) Exposed: Yes Necrotic Quality: Adherent Slough Tendon Exposed: No Muscle Exposed: No Joint Exposed: No Bone Exposed: Yes Periwound Skin Texture Texture Color Hambly, Sayed E. ([id number]) No Abnormalities Noted: No No Abnormalities Noted: No Callus: Yes Temperature / Pain Moisture Temperature: No Abnormality No Abnormalities Noted: No Tenderness on Palpation: Yes Maceration: Yes Wound Preparation Ulcer Cleansing: Rinsed/Irrigated with Saline, Other: soap and water, Topical Anesthetic Applied: Other: lidocaine 4%, Treatment Notes Wound #8 (Right, Lateral Foot) 1. Cleansed with: Clean wound with Normal Saline Cleanse wound with antibacterial soap and water 2. Anesthetic Topical Lidocaine 4% cream to wound bed prior to debridement 3. Peri-wound Care: Other peri-wound care (specify in notes) 4. Dressing Applied: Santyl Ointment 5. Secondary Dressing Applied ABD Pad 7. Secured with Other (specify in notes) Notes TCA, kerlix/coban wrap Electronic Signature(s) Signed: 06/11/2017 2:15:42 PM By: Gretta Cool, BSN, RN, CWS, Kim RN, BSN Signed: 06/11/2017 2:43:37 PM By: Carolyne Fiscal,  Debra Entered By: Gretta Cool, BSN, RN, CWS, Kim on 06/10/2017 09:11:57 Weisbecker, [name] ([id number]) -------------------------------------------------------------------------------- Wound Assessment Details Patient Name: [name] Date of Service: 06/10/2017 8:30 AM Medical Record Number: [id number] Patient Account Number: [id number] Date of Birth/Sex: May 18, 1940 (76 y.o. M) Treating RN: Ahmed Prima Primary Care [place] Festa: Cyndi Bender Other Clinician: Referring Whitnee Orzel: Cyndi Bender Treating Latarra Eagleton/Extender: Tito Dine in Treatment: 9 Wound Status Wound Number: 9 Primary Pressure Ulcer Etiology: Wound Location: Left Metatarsal head first Wound Open Wounding Event: Gradually Appeared Status: Date Acquired: 05/06/2017 Comorbid Congestive Heart Failure,  Coronary Artery Weeks Of Treatment: 3 History: Disease, Hypertension, Myocardial Infarction, Clustered Wound: No Osteoarthritis Photos Photo Uploaded By: Alric Quan on 06/11/2017 08:01:24 Wound Measurements Length: (cm) 0.4 Width: (cm) 0.4 Depth: (cm) 0.1 Area: (cm) 0.126 Volume: (cm) 0.013 % Reduction in Area: 35.7% % Reduction in Volume: 35% Epithelialization: None Tunneling: No Undermining: No Wound Description Classification: Category/Stage II Wound Margin: Flat and Intact Exudate Amount: Large Exudate Type: Serous Exudate Color: amber Foul Odor After Cleansing: No Slough/Fibrino Yes Wound Bed Granulation Amount: None Present (0%) Exposed Structure Necrotic Amount: Large (67-100%) Fascia Exposed: No Necrotic Quality: Eschar, Adherent Slough Fat Layer (Subcutaneous Tissue) Exposed: Yes Tendon Exposed: No Muscle Exposed: No Joint Exposed: No Bone Exposed: No Periwound Skin Texture Texture Color No Abnormalities Noted: No No Abnormalities Noted: No Witham, Daryus E. ([id number]) Callus: No Atrophie Blanche: No Crepitus: No Cyanosis: No Excoriation: No Ecchymosis: No Induration: No Erythema: No Rash: No Hemosiderin Staining: No Scarring: No Mottled: No Pallor: No Moisture Rubor: No No Abnormalities Noted: No Dry / Scaly: No Temperature / Pain Maceration: Yes Temperature: No Abnormality Wound Preparation Ulcer Cleansing: Rinsed/Irrigated with Saline Topical Anesthetic Applied: Other: lidocaine 4%, Treatment Notes Wound #9 (Left Metatarsal head first) 1. Cleansed with: Clean wound with Normal Saline 2. Anesthetic Topical Lidocaine 4% cream to wound bed prior to debridement 4. Dressing Applied: Promogran 5. Secondary Dressing Applied Bordered Foam Dressing Electronic Signature(s) Signed: 06/11/2017 2:43:37 PM By: Alric Quan Entered By: Alric Quan on 06/10/2017 09:03:53 Lotter, [name]  ([id number]) -------------------------------------------------------------------------------- [facility] Details Patient Name: [name] Date of Service: 06/10/2017 8:30 AM Medical Record Number: [id number] Patient Account Number: [id number] Date of Birth/Sex: 02-13-1940 (76 y.o. M) Treating RN: Ahmed Prima Primary Care Christin [place]: Cyndi Bender Other Clinician: Referring Cylas Falzone: Cyndi Bender Treating Saed Hudlow/Extender: Tito Dine in Treatment: 9 Vital Signs Time Taken: 08:44 Temperature (F): 97.6 Height (in): 68 Pulse (bpm): 66 Respiratory Rate (breaths/min): 18 Blood Pressure (mmHg): 90/68 Reference Range: 80 - 120 mg / dl Notes Took BP by dinamap and it was 87/46 and then I took it manually and it was 90/68. Made MD aware of same. Electronic Signature(s) Signed: 06/11/2017 2:43:37 PM By: Alric Quan Entered By: Alric Quan on 06/10/2017 08:49:37

## 2017-06-15 DIAGNOSIS — I255 Ischemic cardiomyopathy: Secondary | ICD-10-CM | POA: Diagnosis not present

## 2017-06-15 DIAGNOSIS — I2510 Atherosclerotic heart disease of native coronary artery without angina pectoris: Secondary | ICD-10-CM | POA: Diagnosis not present

## 2017-06-15 DIAGNOSIS — I110 Hypertensive heart disease with heart failure: Secondary | ICD-10-CM | POA: Diagnosis not present

## 2017-06-15 DIAGNOSIS — M069 Rheumatoid arthritis, unspecified: Secondary | ICD-10-CM | POA: Diagnosis not present

## 2017-06-15 DIAGNOSIS — S81811D Laceration without foreign body, right lower leg, subsequent encounter: Secondary | ICD-10-CM | POA: Diagnosis not present

## 2017-06-15 DIAGNOSIS — I5023 Acute on chronic systolic (congestive) heart failure: Secondary | ICD-10-CM | POA: Diagnosis not present

## 2017-06-17 ENCOUNTER — Encounter: Payer: Medicare Other | Admitting: Internal Medicine

## 2017-06-17 DIAGNOSIS — I255 Ischemic cardiomyopathy: Secondary | ICD-10-CM | POA: Diagnosis not present

## 2017-06-17 DIAGNOSIS — I890 Lymphedema, not elsewhere classified: Secondary | ICD-10-CM | POA: Diagnosis not present

## 2017-06-17 DIAGNOSIS — B952 Enterococcus as the cause of diseases classified elsewhere: Secondary | ICD-10-CM | POA: Diagnosis not present

## 2017-06-17 DIAGNOSIS — L97311 Non-pressure chronic ulcer of right ankle limited to breakdown of skin: Secondary | ICD-10-CM | POA: Diagnosis not present

## 2017-06-17 DIAGNOSIS — L97511 Non-pressure chronic ulcer of other part of right foot limited to breakdown of skin: Secondary | ICD-10-CM | POA: Diagnosis not present

## 2017-06-17 DIAGNOSIS — L97521 Non-pressure chronic ulcer of other part of left foot limited to breakdown of skin: Secondary | ICD-10-CM | POA: Diagnosis not present

## 2017-06-17 DIAGNOSIS — L97516 Non-pressure chronic ulcer of other part of right foot with bone involvement without evidence of necrosis: Secondary | ICD-10-CM | POA: Diagnosis not present

## 2017-06-17 DIAGNOSIS — L97522 Non-pressure chronic ulcer of other part of left foot with fat layer exposed: Secondary | ICD-10-CM | POA: Diagnosis not present

## 2017-06-17 DIAGNOSIS — L97312 Non-pressure chronic ulcer of right ankle with fat layer exposed: Secondary | ICD-10-CM | POA: Diagnosis not present

## 2017-06-19 ENCOUNTER — Telehealth (INDEPENDENT_AMBULATORY_CARE_PROVIDER_SITE_OTHER): Payer: Self-pay | Admitting: Vascular Surgery

## 2017-06-19 ENCOUNTER — Encounter (INDEPENDENT_AMBULATORY_CARE_PROVIDER_SITE_OTHER): Payer: Self-pay

## 2017-06-19 DIAGNOSIS — I255 Ischemic cardiomyopathy: Secondary | ICD-10-CM | POA: Diagnosis not present

## 2017-06-19 DIAGNOSIS — S81811D Laceration without foreign body, right lower leg, subsequent encounter: Secondary | ICD-10-CM | POA: Diagnosis not present

## 2017-06-19 DIAGNOSIS — I2510 Atherosclerotic heart disease of native coronary artery without angina pectoris: Secondary | ICD-10-CM | POA: Diagnosis not present

## 2017-06-19 DIAGNOSIS — I5023 Acute on chronic systolic (congestive) heart failure: Secondary | ICD-10-CM | POA: Diagnosis not present

## 2017-06-19 DIAGNOSIS — I110 Hypertensive heart disease with heart failure: Secondary | ICD-10-CM | POA: Diagnosis not present

## 2017-06-19 DIAGNOSIS — M069 Rheumatoid arthritis, unspecified: Secondary | ICD-10-CM | POA: Diagnosis not present

## 2017-06-19 NOTE — Telephone Encounter (Signed)
Patient is scheduled for his right leg angiogram with Dew.

## 2017-06-20 NOTE — Progress Notes (Signed)
[name], [name] ([id number]) Visit Report for 06/17/2017 Arrival Information Details Patient Name: [name], [name] Date of Service: 06/17/2017 8:45 AM Medical Record Number: [id number] Patient Account Number: [id number] Date of Birth/Sex: February 05, 1940 (76 y.o. M) Treating RN: Ahmed Prima Primary Care Siana Panameno: Cyndi Bender Other Clinician: Referring Samariyah Cowles: Cyndi Bender Treating Halie Gass/Extender: Tito Dine in Treatment: 10 Visit Information History Since Last Visit All ordered tests and consults were completed: No Patient Arrived: Cane Added or deleted any medications: No Arrival Time: 08:43 Any new allergies or adverse reactions: No Accompanied By: spouse Had a fall or experienced change in No Transfer Assistance: None activities of daily living that may affect Patient Identification Verified: Yes risk of falls: Secondary Verification Process Completed: Yes Signs or symptoms of abuse/neglect since last visito No Patient Requires Transmission-Based No Hospitalized since last visit: No Precautions: Implantable device outside of the clinic excluding No Patient Has Alerts: Yes cellular tissue based products placed in the center Patient Alerts: R ABI >220 since last visit: L ABI Has Dressing in Place as Prescribed: Yes >220 Has Compression in Place as Prescribed: Yes Pain Present Now: No Electronic Signature(s) Signed: 06/17/2017 3:54:16 PM By: Alric Quan Entered By: Alric Quan on 06/17/2017 08:59:09 [name], [name] ([id number]) -------------------------------------------------------------------------------- Clinic Level of Care Assessment Details Patient Name: [name] Date of Service: 06/17/2017 8:45 AM Medical Record Number: [id number] Patient Account Number: [id number] Date of Birth/Sex: [birth year] (76 y.o. M) Treating RN: Cornell Barman Primary Care Aceton Kinnear: Cyndi Bender Other Clinician: Referring Breyon Blass: Cyndi Bender Treating Jasneet Schobert/Extender: Tito Dine in Treatment: 10 Clinic Level of Care Assessment Items TOOL 4 Quantity Score []  - Use when only an EandM is performed on FOLLOW-UP visit 0 ASSESSMENTS - Nursing Assessment / Reassessment []  - Reassessment of Co-morbidities (includes updates in patient status) 0 X- 1 5 Reassessment of Adherence to Treatment Plan ASSESSMENTS - Wound and Skin Assessment / Reassessment []  - Simple Wound Assessment / Reassessment - one wound 0 X- 3 5 Complex Wound Assessment / Reassessment - multiple wounds []  - 0 Dermatologic / Skin Assessment (not related to wound area) ASSESSMENTS - Focused Assessment []  - Circumferential Edema Measurements - multi extremities 0 []  - 0 Nutritional Assessment / Counseling / Intervention []  - 0 Lower Extremity Assessment (monofilament, tuning fork, pulses) []  - 0 Peripheral Arterial Disease Assessment (using hand held doppler) ASSESSMENTS - Ostomy and/or Continence Assessment and Care []  - Incontinence Assessment and Management 0 []  - 0 Ostomy Care Assessment and Management (repouching, etc.) PROCESS - Coordination of Care X - Simple Patient / Family Education for ongoing care 1 15 []  - 0 Complex (extensive) Patient / Family Education for ongoing care X- 1 10 Staff obtains Programmer, systems, Records, Test Results / Process Orders []  - 0 Staff telephones HHA, Nursing Homes / Clarify orders / etc []  - 0 Routine Transfer to another Facility (non-emergent condition) []  - 0 Routine Hospital Admission (non-emergent condition) []  - 0 New Admissions / Biomedical engineer / Ordering NPWT, Apligraf, etc. []  - 0 Emergency Hospital Admission (emergent condition) X- 1 10 Simple Discharge Coordination Jermaine, [name]. ([id number]) []  - 0 Complex (extensive) Discharge Coordination PROCESS - Special Needs []  - Pediatric / Minor Patient Management 0 []  - 0 Isolation Patient Management []  - 0 Hearing / Language /  Visual special needs []  - 0 Assessment of Community assistance (transportation, D/C planning, etc.) []  - 0 Additional assistance / Altered mentation []  - 0 Support Surface(s) Assessment (bed, cushion, seat, etc.)  INTERVENTIONS - Wound Cleansing / Measurement []  - Simple Wound Cleansing - one wound 0 X- 3 5 Complex Wound Cleansing - multiple wounds X- 1 5 Wound Imaging (photographs - any number of wounds) []  - 0 Wound Tracing (instead of photographs) []  - 0 Simple Wound Measurement - one wound X- 3 5 Complex Wound Measurement - multiple wounds INTERVENTIONS - Wound Dressings []  - Small Wound Dressing one or multiple wounds 0 X- 1 15 Medium Wound Dressing one or multiple wounds X- 1 20 Large Wound Dressing one or multiple wounds []  - 0 Application of Medications - topical []  - 0 Application of Medications - injection INTERVENTIONS - Miscellaneous []  - External ear exam 0 []  - 0 Specimen Collection (cultures, biopsies, blood, body fluids, etc.) []  - 0 Specimen(s) / Culture(s) sent or taken to Lab for analysis []  - 0 Patient Transfer (multiple staff / Civil Service fast streamer / Similar devices) []  - 0 Simple Staple / Suture removal (25 or less) []  - 0 Complex Staple / Suture removal (26 or more) []  - 0 Hypo / Hyperglycemic Management (close monitor of Blood Glucose) []  - 0 Ankle / Brachial Index (ABI) - do not check if billed separately X- 1 5 Vital Signs [name], Jermaine E. ([id number]) Has the patient been seen at the hospital within the last three years: Yes Total Score: 130 Level Of Care: New/Established - Level 4 Electronic Signature(s) Signed: 06/18/2017 8:47:48 AM By: Gretta Cool, BSN, RN, CWS, Kim RN, BSN Entered By: Gretta Cool, BSN, RN, CWS, Kim on 06/17/2017 09:30:31 [name], [name] ([id number]) -------------------------------------------------------------------------------- Encounter Discharge Information Details Patient Name: [name] Date of Service: 06/17/2017 8:45  AM Medical Record Number: [id number] Patient Account Number: [id number] Date of Birth/Sex: 1940/12/07 (76 y.o. M) Treating RN: Roger Shelter Primary Care Rylinn Linzy: Cyndi Bender Other Clinician: Referring Malachy Coleman: Cyndi Bender Treating Xeng Kucher/Extender: Tito Dine in Treatment: 10 Encounter Discharge Information Items Discharge Condition: Stable Ambulatory Status: Cane Discharge Destination: Home Transportation: Private Auto Accompanied By: wife Schedule Follow-up Appointment: Yes Clinical Summary of Care: Electronic Signature(s) Signed: 06/17/2017 4:05:56 PM By: Roger Shelter Entered By: Roger Shelter on 06/17/2017 09:54:12 Messmer, [name] ([id number]) -------------------------------------------------------------------------------- Lower Extremity Assessment Details Patient Name: [name] Date of Service: 06/17/2017 8:45 AM Medical Record Number: [id number] Patient Account Number: [id number] Date of Birth/Sex: 1940-11-12 (76 y.o. M) Treating RN: Ahmed Prima Primary Care Marsden Zaino: Cyndi Bender Other Clinician: Referring Aerin Delany: Cyndi Bender Treating Teighan Aubert/Extender: Tito Dine in Treatment: 10 Edema Assessment Assessed: [Left: No] [Right: No] [Left: Edema] [Right: :] Calf Left: Right: Point of Measurement: 27 cm From Medial Instep 32.7 cm 28.6 cm Ankle Left: Right: Point of Measurement: 11 cm From Medial Instep 26 cm 23 cm Vascular Assessment Pulses: Dorsalis Pedis Palpable: [Left:Yes] [Right:Yes] Posterior Tibial Extremity colors, hair growth, and conditions: Extremity Color: [Left:Normal] [Right:Hyperpigmented] Temperature of Extremity: [Left:Warm] [Right:Warm] Capillary Refill: [Left:< 3 seconds] [Right:< 3 seconds] Toe Nail Assessment Left: Right: Thick: Yes Yes Discolored: Yes Yes Deformed: Yes Yes Improper Length and Hygiene: Yes Yes Notes LLE ABI noncompressible Electronic Signature(s) Signed:  06/17/2017 3:54:16 PM By: Alric Quan Entered By: Alric Quan on 06/17/2017 08:58:49 Hemsley, [name] ([id number]) -------------------------------------------------------------------------------- Multi Wound Chart Details Patient Name: [name] Date of Service: 06/17/2017 8:45 AM Medical Record Number: [id number] Patient Account Number: [id number] Date of Birth/Sex: [birth year] (76 y.o. M) Treating RN: Cornell Barman Primary Care Sayge Salvato: Cyndi Bender Other Clinician: Referring Laurie Lovejoy: Cyndi Bender Treating Eean Buss/Extender: Tito Dine in Treatment: 10 Vital  Signs Height(in): 68 Pulse(bpm): 70 Weight(lbs): Blood Pressure(mmHg): 94/58 Body Mass Index(BMI): Temperature(F): 97.6 Respiratory Rate 18 (breaths/min): Photos: [6:No Photos] [8:No Photos] [9:No Photos] Wound Location: [6:Right Malleolus - Lateral] [8:Right [name] - Lateral] [9:Left Metatarsal head first] Wounding Event: [6:Gradually Appeared] [8:Gradually Appeared] [9:Gradually Appeared] Primary Etiology: [6:Arterial Insufficiency Ulcer] [8:Arterial Insufficiency Ulcer] [9:Pressure Ulcer] Comorbid History: [6:Congestive Heart Failure, Coronary Artery Disease, Hypertension, Myocardial Infarction, Osteoarthritis] [8:Congestive Heart Failure, Coronary Artery Disease, Hypertension, Myocardial Infarction, Osteoarthritis] [9:Congestive Heart  Failure, Coronary Artery Disease, Hypertension, Myocardial Infarction, Osteoarthritis] Date Acquired: [6:03/23/2017] [8:04/29/2017] [9:05/06/2017] Weeks of Treatment: [6:10] [8:7] [9:4] Wound Status: [6:Open] [8:Open] [9:Open] Measurements L x W x D [6:0.7x0.4x0.4] [8:0.8x0.8x0.2] [9:0.3x0.4x0.1] (cm) Area (cm) : [6:0.22] [8:0.503] [9:0.094] Volume (cm) : [6:0.088] [8:0.101] [9:0.009] % Reduction in Area: [6:-609.70%] [8:-608.50%] [9:52.00%] % Reduction in Volume: [6:-2833.30%] [8:-1342.90%] [9:55.00%] Classification: [6:Full Thickness Without Exposed  Support Structures] [8:Full Thickness With Exposed Support Structures] [9:Category/Stage II] Exudate Amount: [6:Large] [8:Large] [9:Large] Exudate Type: [6:Serous] [8:Serous] [9:Serous] Exudate Color: [6:amber] [8:amber] [9:amber] Wound Margin: [6:Distinct, outline attached] [8:Distinct, outline attached] [9:Flat and Intact] Granulation Amount: [6:Medium (34-66%)] [8:None Present (0%)] [9:None Present (0%)] Granulation Quality: [6:Red, Pink] [8:N/A] [9:N/A] Necrotic Amount: [6:Medium (34-66%)] [8:Large (67-100%)] [9:Large (67-100%)] Exposed Structures: [6:Fascia: No Fat Layer (Subcutaneous Tissue) Exposed: No Tendon: No Muscle: No Joint: No Bone: No] [8:Fat Layer (Subcutaneous Tissue) Exposed: Yes Bone: Yes Fascia: No Tendon: No Muscle: No Joint: No] [9:Fat Layer (Subcutaneous Tissue) Exposed: Yes  Fascia: No Tendon: No Muscle: No Joint: No Bone: No] Epithelialization: [6:None] [8:None] [9:None] Periwound Skin Texture: [6:Callus: Yes Excoriation: No Induration: No] [8:Callus: Yes] [9:Excoriation: No Induration: No Callus: No] Crepitus: No Crepitus: No Rash: No Rash: No Scarring: No Scarring: No Periwound Skin Moisture: Maceration: No Maceration: Yes Maceration: Yes Dry/Scaly: No Dry/Scaly: No Periwound Skin Color: Atrophie Blanche: No No Abnormalities Noted Atrophie Blanche: No Cyanosis: No Cyanosis: No Ecchymosis: No Ecchymosis: No Erythema: No Erythema: No Hemosiderin Staining: No Hemosiderin Staining: No Mottled: No Mottled: No Pallor: No Pallor: No Rubor: No Rubor: No Temperature: No Abnormality No Abnormality No Abnormality Tenderness on Palpation: Yes Yes No Wound Preparation: Ulcer Cleansing: Ulcer Cleansing: Ulcer Cleansing: Rinsed/Irrigated with Saline, Rinsed/Irrigated with Saline, Rinsed/Irrigated with Saline Other: soap and water Other: soap and water Topical Anesthetic Applied: Topical Anesthetic Applied: Topical Anesthetic Applied: Other: lidocaine  4% Other: lidocaine 4% Other: lidocaine 4% Treatment Notes Wound #6 (Right, Lateral Malleolus) 1. Cleansed with: Clean wound with Normal Saline 2. Anesthetic Topical Lidocaine 4% cream to wound bed prior to debridement 4. Dressing Applied: Santyl Ointment 5. Secondary Dressing Applied Dry Gauze Notes kerlix and coban with seeve Wound #8 (Right, Lateral [name]) 1. Cleansed with: Clean wound with Normal Saline 2. Anesthetic Topical Lidocaine 4% cream to wound bed prior to debridement 4. Dressing Applied: Santyl Ointment 5. Secondary Dressing Applied Dry Gauze Notes kerlix and coban with seeve Wound #9 (Left Metatarsal head first) 1. Cleansed with: Clean wound with Normal Saline 2. Anesthetic Topical Lidocaine 4% cream to wound bed prior to debridement 4. Dressing Applied: YESENIA, FONTENETTE ([id number]) Prisma Ag 5. Secondary Dressing Applied Bordered Foam Dressing Electronic Signature(s) Signed: 06/17/2017 4:49:36 PM By: Linton Ham MD Entered By: Linton Ham on 06/17/2017 09:56:56 Litle, [name] ([id number]) -------------------------------------------------------------------------------- Multi-Disciplinary Care Plan Details Patient Name: [name] Date of Service: 06/17/2017 8:45 AM Medical Record Number: [id number] Patient Account Number: [id number] Date of Birth/Sex: Feb 12, 1940 (76 y.o. M) Treating RN: Cornell Barman Primary Care Marypat Kimmet: Cyndi Bender Other Clinician: Referring Azad Calame: Cyndi Bender Treating  Demauri Advincula/Extender: Tito Dine in Treatment: 10 Active Inactive ` Abuse / Safety / Falls / Self Care Management Nursing Diagnoses: History of Falls Goals: Patient will remain injury free related to falls Date Initiated: 04/08/2017 Target Resolution Date: 05/08/2017 Goal Status: Active Interventions: Assess fall risk on admission and as needed Notes: ` Orientation to the Wound Care Program Nursing Diagnoses: Knowledge  deficit related to the wound healing center program Goals: Patient/caregiver will verbalize understanding of the [facility] Date Initiated: 04/08/2017 Target Resolution Date: 05/08/2017 Goal Status: Active Interventions: Provide education on orientation to the wound center Notes: ` Soft Tissue Infection Nursing Diagnoses: Impaired tissue integrity Potential for infection: soft tissue Goals: Patient will remain free of wound infection Date Initiated: 04/08/2017 Target Resolution Date: 05/08/2017 Goal Status: Active MARGUES, FILIPPINI ([id number]) Interventions: Assess signs and symptoms of infection every visit Notes: ` Wound/Skin Impairment Nursing Diagnoses: Impaired tissue integrity Goals: Ulcer/skin breakdown will heal within 14 weeks Date Initiated: 04/08/2017 Target Resolution Date: 07/20/2017 Goal Status: Active Interventions: Assess patient/caregiver ability to perform ulcer/skin care regimen upon admission and as needed Provide education on ulcer and skin care Treatment Activities: Topical wound management initiated : 04/08/2017 Notes: Electronic Signature(s) Signed: 06/18/2017 8:47:48 AM By: Gretta Cool, BSN, RN, CWS, Kim RN, BSN Entered By: Gretta Cool, BSN, RN, CWS, Kim on 06/17/2017 09:20:43 Kozakiewicz, [name] ([id number]) -------------------------------------------------------------------------------- Pain Assessment Details Patient Name: [name] Date of Service: 06/17/2017 8:45 AM Medical Record Number: [id number] Patient Account Number: [id number] Date of Birth/Sex: 1940-09-23 (76 y.o. M) Treating RN: Ahmed Prima Primary Care Koury Roddy: Cyndi Bender Other Clinician: Referring Candace Begue: Cyndi Bender Treating Mari Battaglia/Extender: Tito Dine in Treatment: 10 Active Problems Location of Pain Severity and Description of Pain Patient Has Paino No Site Locations Pain Management and Medication Current Pain Management: Electronic  Signature(s) Signed: 06/17/2017 3:54:16 PM By: Alric Quan Entered By: Alric Quan on 06/17/2017 08:45:21 Joss, [name] ([id number]) -------------------------------------------------------------------------------- Patient/Caregiver Education Details Patient Name: [name] Date of Service: 06/17/2017 8:45 AM Medical Record Number: [id number] Patient Account Number: [id number] Date of Birth/Gender: 30-Jun-1940 (76 y.o. M) Treating RN: Roger Shelter Primary Care Physician: Cyndi Bender Other Clinician: Referring Physician: Cyndi Bender Treating Physician/Extender: Tito Dine in Treatment: 10 Education Assessment Education Provided To: Patient Education Topics Provided Wound Debridement: Handouts: Wound Debridement Methods: Explain/Verbal Responses: State content correctly Wound/Skin Impairment: Handouts: Caring for Your Ulcer Methods: Explain/Verbal Responses: State content correctly Electronic Signature(s) Signed: 06/17/2017 4:05:56 PM By: Roger Shelter Entered By: Roger Shelter on 06/17/2017 09:54:35 Deavers, [name] ([id number]) -------------------------------------------------------------------------------- Wound Assessment Details Patient Name: [name] Date of Service: 06/17/2017 8:45 AM Medical Record Number: [id number] Patient Account Number: [id number] Date of Birth/Sex: May 31, 1940 (76 y.o. M) Treating RN: Ahmed Prima Primary Care Wendel Homeyer: Cyndi Bender Other Clinician: Referring Mia Winthrop: Cyndi Bender Treating Solly Derasmo/Extender: Tito Dine in Treatment: 10 Wound Status Wound Number: 6 Primary Arterial Insufficiency Ulcer Etiology: Wound Location: Right Malleolus - Lateral Wound Open Wounding Event: Gradually Appeared Status: Date Acquired: 03/23/2017 Comorbid Congestive Heart Failure, Coronary Artery Weeks Of Treatment: 10 History: Disease, Hypertension, Myocardial  Infarction, Clustered Wound: No Osteoarthritis Photos Photo Uploaded By: Alric Quan on 06/17/2017 15:48:07 Wound Measurements Length: (cm) 0.7 Width: (cm) 0.4 Depth: (cm) 0.4 Area: (cm) 0.22 Volume: (cm) 0.088 % Reduction in Area: -609.7% % Reduction in Volume: -2833.3% Epithelialization: None Tunneling: No Undermining: No Wound Description Full Thickness Without Exposed Support Classification: Structures Wound Margin: Distinct, outline attached Exudate Large Amount: Exudate Type:  Serous Exudate Color: amber Foul Odor After Cleansing: No Slough/Fibrino Yes Wound Bed Granulation Amount: Medium (34-66%) Exposed Structure Granulation Quality: Red, Pink Fascia Exposed: No Necrotic Amount: Medium (34-66%) Fat Layer (Subcutaneous Tissue) Exposed: No Necrotic Quality: Adherent Slough Tendon Exposed: No Muscle Exposed: No Joint Exposed: No Bone Exposed: No Periwound Skin Texture Texture Color Buege, Dequante E. ([id number]) No Abnormalities Noted: No No Abnormalities Noted: No Callus: Yes Atrophie Blanche: No Crepitus: No Cyanosis: No Excoriation: No Ecchymosis: No Induration: No Erythema: No Rash: No Hemosiderin Staining: No Scarring: No Mottled: No Pallor: No Moisture Rubor: No No Abnormalities Noted: No Dry / Scaly: No Temperature / Pain Maceration: No Temperature: No Abnormality Tenderness on Palpation: Yes Wound Preparation Ulcer Cleansing: Rinsed/Irrigated with Saline, Other: soap and water, Topical Anesthetic Applied: Other: lidocaine 4%, Treatment Notes Wound #6 (Right, Lateral Malleolus) 1. Cleansed with: Clean wound with Normal Saline 2. Anesthetic Topical Lidocaine 4% cream to wound bed prior to debridement 4. Dressing Applied: Santyl Ointment 5. Secondary Dressing Applied Dry Gauze Notes kerlix and coban with seeve Electronic Signature(s) Signed: 06/17/2017 3:54:16 PM By: Alric Quan Entered By: Alric Quan on  06/17/2017 09:02:07 [place], [name] ([id number]) -------------------------------------------------------------------------------- Wound Assessment Details Patient Name: [name] Date of Service: 06/17/2017 8:45 AM Medical Record Number: [id number] Patient Account Number: [id number] Date of Birth/Sex: 24-Sep-1940 (76 y.o. M) Treating RN: Ahmed Prima Primary Care Laiba Fuerte: Cyndi Bender Other Clinician: Referring Aeriana Speece: Cyndi Bender Treating Raschelle Wisenbaker/Extender: Tito Dine in Treatment: 10 Wound Status Wound Number: 8 Primary Arterial Insufficiency Ulcer Etiology: Wound Location: Right [name] - Lateral Wound Open Wounding Event: Gradually Appeared Status: Date Acquired: 04/29/2017 Comorbid Congestive Heart Failure, Coronary Artery Weeks Of Treatment: 7 History: Disease, Hypertension, Myocardial Infarction, Clustered Wound: No Osteoarthritis Photos Photo Uploaded By: Alric Quan on 06/17/2017 15:48:07 Wound Measurements Length: (cm) 0.8 Width: (cm) 0.8 Depth: (cm) 0.2 Area: (cm) 0.503 Volume: (cm) 0.101 % Reduction in Area: -608.5% % Reduction in Volume: -1342.9% Epithelialization: None Tunneling: No Undermining: No Wound Description Full Thickness With Exposed Support Classification: Structures Wound Margin: Distinct, outline attached Exudate Large Amount: Exudate Type: Serous Exudate Color: amber Foul Odor After Cleansing: No Slough/Fibrino Yes Wound Bed Granulation Amount: None Present (0%) Exposed Structure Necrotic Amount: Large (67-100%) Fascia Exposed: No Necrotic Quality: Adherent Slough Fat Layer (Subcutaneous Tissue) Exposed: Yes Tendon Exposed: No Muscle Exposed: No Joint Exposed: No Bone Exposed: Yes Periwound Skin Texture Texture Color Harbold, Macarius E. ([id number]) No Abnormalities Noted: No No Abnormalities Noted: No Callus: Yes Temperature / Pain Moisture Temperature: No Abnormality No Abnormalities  Noted: No Tenderness on Palpation: Yes Maceration: Yes Wound Preparation Ulcer Cleansing: Rinsed/Irrigated with Saline, Other: soap and water, Topical Anesthetic Applied: Other: lidocaine 4%, Treatment Notes Wound #8 (Right, Lateral [name]) 1. Cleansed with: Clean wound with Normal Saline 2. Anesthetic Topical Lidocaine 4% cream to wound bed prior to debridement 4. Dressing Applied: Santyl Ointment 5. Secondary Dressing Applied Dry Gauze Notes kerlix and coban with seeve Electronic Signature(s) Signed: 06/17/2017 3:54:16 PM By: Alric Quan Entered By: Alric Quan on 06/17/2017 09:03:00 Gow, [name] ([id number]) -------------------------------------------------------------------------------- Wound Assessment Details Patient Name: [name] Date of Service: 06/17/2017 8:45 AM Medical Record Number: [id number] Patient Account Number: [id number] Date of Birth/Sex: [birth year] (76 y.o. M) Treating RN: Ahmed Prima Primary Care Amalie Koran: Cyndi Bender Other Clinician: Referring Almond Fitzgibbon: Cyndi Bender Treating Graclyn Lawther/Extender: Tito Dine in Treatment: 10 Wound Status Wound Number: 9 Primary Pressure Ulcer Etiology: Wound Location: Left Metatarsal head first Wound Open  Wounding Event: Gradually Appeared Status: Date Acquired: 05/06/2017 Comorbid Congestive Heart Failure, Coronary Artery Weeks Of Treatment: 4 History: Disease, Hypertension, Myocardial Infarction, Clustered Wound: No Osteoarthritis Photos Photo Uploaded By: Alric Quan on 06/17/2017 15:48:53 Wound Measurements Length: (cm) 0.3 Width: (cm) 0.4 Depth: (cm) 0.1 Area: (cm) 0.094 Volume: (cm) 0.009 % Reduction in Area: 52% % Reduction in Volume: 55% Epithelialization: None Tunneling: No Undermining: No Wound Description Classification: Category/Stage II Wound Margin: Flat and Intact Exudate Amount: Large Exudate Type: Serous Exudate Color: amber Foul  Odor After Cleansing: No Slough/Fibrino Yes Wound Bed Granulation Amount: None Present (0%) Exposed Structure Necrotic Amount: Large (67-100%) Fascia Exposed: No Necrotic Quality: Adherent Slough Fat Layer (Subcutaneous Tissue) Exposed: Yes Tendon Exposed: No Muscle Exposed: No Joint Exposed: No Bone Exposed: No Periwound Skin Texture Texture Color No Abnormalities Noted: No No Abnormalities Noted: No Christine, Abdoulie E. ([id number]) Callus: No Atrophie Blanche: No Crepitus: No Cyanosis: No Excoriation: No Ecchymosis: No Induration: No Erythema: No Rash: No Hemosiderin Staining: No Scarring: No Mottled: No Pallor: No Moisture Rubor: No No Abnormalities Noted: No Dry / Scaly: No Temperature / Pain Maceration: Yes Temperature: No Abnormality Wound Preparation Ulcer Cleansing: Rinsed/Irrigated with Saline Topical Anesthetic Applied: Other: lidocaine 4%, Treatment Notes Wound #9 (Left Metatarsal head first) 1. Cleansed with: Clean wound with Normal Saline 2. Anesthetic Topical Lidocaine 4% cream to wound bed prior to debridement 4. Dressing Applied: Prisma Ag 5. Secondary Dressing Applied Bordered Foam Dressing Electronic Signature(s) Signed: 06/17/2017 3:54:16 PM By: Alric Quan Entered By: Alric Quan on 06/17/2017 09:03:37 Phegley, [name] ([id number]) -------------------------------------------------------------------------------- [place] Details Patient Name: [name] Date of Service: 06/17/2017 8:45 AM Medical Record Number: [id number] Patient Account Number: [id number] Date of Birth/Sex: [birth year] (76 y.o. M) Treating RN: Ahmed Prima Primary Care Velton Roselle: Cyndi Bender Other Clinician: Referring Neilson Oehlert: Cyndi Bender Treating Mliss Wedin/Extender: Tito Dine in Treatment: 10 Vital Signs Time Taken: 08:45 Temperature (F): 97.6 Height (in): 68 Pulse (bpm): 70 Respiratory Rate (breaths/min): 18 Blood Pressure  (mmHg): 94/58 Reference Range: 80 - 120 mg / dl Electronic Signature(s) Signed: 06/17/2017 3:54:16 PM By: Alric Quan Entered By: Alric Quan on 06/17/2017 08:48:43

## 2017-06-20 NOTE — Progress Notes (Signed)
EFSTATHIOS, SAWIN ([id number]) Visit Report for 06/17/2017 HPI Details Patient Name: [name], OVERBECK Date of Service: 06/17/2017 8:45 AM Medical Record Number: [id number] Patient Account Number: [id number] Date of Birth/Sex: 20-Apr-1940 (77 y.o. M) Treating RN: Primary Care Provider: Cyndi Bender Other Clinician: Referring Provider: Cyndi Bender Treating Provider/Extender: Tito Dine in Treatment: 10 History of Present Illness HPI Description: 04/08/17; this is a complex 77 year old man referred here from [facility]. He had been referred there for bilateral lower extremity edema with ulcer formation predominantly on the right calf but also the right [name]. He had been receiving Unna boots bilaterally. The history here is long. He is not a diabetic however ICU looking through late 2018 he was worked up for chronic headaches, elevated inflammatory markers including C-reactive protein and ESR. He went on to actually have a left temporal artery biopsy wasn't that was negative.he received about 6 weeks of high-dose prednisone 60 mg with improvement in his inflammatory markers. He was admitted to hospital in late November with ventricular tachycardia syncope. He has known ischemic cardiomyopathy. He was admitted in the hospital in mid December. Apparently this was precipitated by a syncopal spell falling out of his scooter while at [employer]. There was ventricular arrhythmia. He has an implantable defibrillator and echocardiogram showed severe LV dysfunction with an EF of 20% and valvular regurgitations including mild AR, moderate MR. There was no stenosis. He ruled in for a non-ST elevation MI in the setting of V. tach.his wife states that sometime during this hospitalization she noted multiple areas of skin change on the right lower calf which became evident just after he left the hospital. He was back in hospital in February with acute renal failure hyponatremia. This  responded to fluid resuscitation.. Interestingly I can't see much description of his right leg at that point in time.he was followed by Dr. Nehemiah Massed of dermatology for the necrotic wounds on his right leg. Apparently a biopsy was planned at one point but not done although in some notes that suggests it was. I cannot see these results area He was noted to have a lot of edema. Was treated with bilateral Unna boots edges really helped with the swelling they have been using Bactroban to small open areas predominantly on the right anterior lower leg His history is complicated by the fact that he has rheumatoid arthritis followed by rheumatology. He is followed by neurology for disabling headaches. At one point this was felt to be giant cell arteritis although a left temporal biopsy was apparently negative. He was given a prolonged course of prednisone at 60 mg which managed his sedimentation rates but apparently did not prove improve the headaches. This is been tapered to off on by rheumatology on 03/13/17 Vascular had plans to do a venous reflux workup as well as arterial studies in May. They also wanted to get him a lymphedema pump. As mentioned he's been using bacitracin under Unna boot wraps to both lower legs 04/15/17; the patient arrives with most of his wounds improved. These are small punched out wounds. Most of them remaining ones are on the right anterior calf with the most problematic over the right lateral malleolus. There are no new areas. The symptom complex or potential symptom complex we are dealing with his chronic disabling headaches with inflammatory markers not responsive to prednisone and with a negative temporal biopsy, lower extremity weakness, skin ulcerations just on the right leg. We have managed to get his arterial studies moved up  to April 30. He has a rheumatology consult at [facility] in June. He has seen dermatology locally, rheumatology locally, neurology locally. 04/22/17; small  punched out areas on the right leg anteriorly posteriorly. Most of these appear to have closed over. Some of them have eschar over the surface. The most problematic area appears to be over the right lateral malleolus. We've been using prisma to all of this. He has arterial studies on April 30 and a rheumatology consult at [facility] on June 28 04/29/17; most of the small punched out areas on the right leg posteriorly are closed. He has 2 or 3 openings anteriorly but most of these appear to be on the way to closing. Still problematically over the right lateral malleolus and right lateral [name] with almost ischemic-looking eschar. His arterial studies that I ordered are due to be done next week on the 30th so we should have them available for our next visit hopefully. He also saw a rheumatologist at [facility] and according to the patient he did 8 Timberlake, Manish E. ([id number]) vials of blood. Finally he has scaling rash on his left [name] and what looks to be at [facility] area on the left anterior leg 05/06/17; most of the small punched out areas on the right leg posteriorly and anteriorly are closed. He still has one small one anteriorly one over the right lateral malleolus and one over the right lateral [name]. He had his arterial studies they didn't seem to do waveform analysis not exactly sure why however in any case is ABIs were noncompressible bilaterally. They did provide TBIs although looking at the pressures it appears that his TBIs are quite normal. I therefore went ahead and debrided the area over the right lateral malleolus and the right lateral [name] 05/13/17; most of the small punched out areas on the right leg posteriorly and anteriorly are closed. He continues to have problematic areas over the right lateral malleolus and the right lateral [name]. He still requiring aggressive debridement of these 2 wounds using silver collagen I reviewed the note from rheumatology I don't think they came up with a specific  diagnosis although he is known to have seropositive RA. They did a panel of lab work when I was able to see his his AMA was negative, anti-smooth muscle antibodies negative antineutrophil cytoplasmic antibodies negative,Liv/kia type 1 negative. Serum C3 and C4 were negative. I don't see his muscle enzymes specifically. He was referred back to his local rheumatologist for management of his known rheumatoid arthritis.the patient states he still feels weak and fatigued. He states he has numbness in both feet and apparently is known to have neuropathy 05/20/17; the patient continues to have a difficult problem on the right lateral malleolus and the right lateral [name]. Both of these wounds have no viable surface even with attempts at debridement. He has a new wound on the left plantar metatarsal head which looks more like a superficial diabetic pressure related injury then part of this underlying issue he has. Most of the rest of the wounds on his legs look satisfactory. Mostly on the right calf.reviewed his arterial studies which showed noncompressible vessels bilaterally but the TBIs were quite normal. 05/27/17; no real improvement in the right lateral malleolus and right lateral [name]. In fact the right lateral [name] is now on bone. I gave him doxycycline empirically last week it appears that he developed photosensitivity was 16 the son in a tractor. I'll not give him any more of this. This is predominantly on his  face and dorsal forearms and hands. I given this more as an anti- inflammatory. I'm going to have him seen by vascular surgery. His TBIs that he had done previously ordered by Dr. Brigitte Pulse were in the normal range They never did full arterial studies on him. he now has small punched out wounds on the right lateral ankle and right lateral [name]. I doubt these are ischemic however I would like a review of his macrovascular status. He does describe some pain at night. I'll reduce the compression from 3  layer to 2 layer and I'm not convinced that this is a macrovascular issue however I want to make sure. We've been using silver alginate 06/03/17; the patient's x-rays that I ordered last time of his right ankle and right lateral [name] did not show definite osteomyelitis. We've been using silver alginate. The wounds are not making any progress. The area on the plantar left first metatarsal head however appears to be better. The patient complains of weakness that is more of the fatigue. He says if he's walking his head will fall onto his chest and that his legs literally gave out on him. He did see neurology in the past however that was at a time where his workup was for temporal arteritis and headaches. 06/10/17; the patient is making no progress with the areas on the right lateral [name] and right lateral malleolus. In fact the area on the [name] probes to bone. X-ray did not show osteomyelitis. He went and saw vein and vascular on 06/04/17 he was felt to have significant reflux in the left greater saphenous vein over this is not in the area we are most concerned about. He could be offered ablation. He was not felt to have venous reflux noted in the right lower extremity. He was felt to have some degree of lymphedema and he was felt to be a candidate for compression pumps. Finally a diagnostic arteriogram was suggested which is really what I'm most interested in. The patient has wife wanted time to think about this, I think they were confused about interacting between venous and arterial discussions. I think it would be probably well worth going through the angiogram. Culture I did have the deeper area on the right lateral [name] showed a few Enterococcus faecalis. I would like to start her on amoxicillin which they will start today for one-week 500 3 times a day 06/17/17; the patient still has punched out areas on the right lateral [name] and right lateral malleolus. There is not a viable surface here. We have  been using Santyl. He also has an area on the plantar aspect of his left first metatarsal head this also seems to be better Electronic Signature(s) Signed: 06/17/2017 4:49:36 PM By: Linton Ham MD Entered By: Linton Ham on 06/17/2017 09:59:55 Korman, [name] ([id number]) -------------------------------------------------------------------------------- Physical Exam Details Patient Name: [name] Date of Service: 06/17/2017 8:45 AM Medical Record Number: [id number] Patient Account Number: [id number] Date of Birth/Sex: 1940/01/25 (77 y.o. M) Treating RN: Primary Care Provider: Cyndi Bender Other Clinician: Referring Provider: Cyndi Bender Treating Provider/Extender: Tito Dine in Treatment: 10 Constitutional Patient is hypotensive.he however appears well. Pulse regular and within target range for patient.[name] Respirations regular, non- labored and within target range.. Temperature is normal and within the target range for the patient.[name] appears in no distress. Eyes Conjunctivae clear. No discharge. Respiratory Respiratory effort is easy and symmetric bilaterally. Rate is normal at rest and on room air.. Cardiovascular pedal pulses faintly palpable  bilaterally. Lymphatic none palpable in the popliteal area bilaterally. Integumentary (Hair, Skin) the areas on his calf appear to be stable. Start. Psychiatric No evidence of depression, anxiety, or agitation. Calm, cooperative, and communicative. Appropriate interactions and affect.. Notes exam; multiple areas on the posterior and anterior part of his right calf remain closed. oDeep holes on the right lateral malleolus and right lateral [name] remain open. There is no viable surface here although I've been attempting to avoid debridement giving the Santyl time to work. There is no evidence of surrounding infection oSmall area on the plantar left first metatarsal head looks better and more  superficial Electronic Signature(s) Signed: 06/17/2017 4:49:36 PM By: Linton Ham MD Entered By: Linton Ham on 06/17/2017 10:04:29 Baskett, [name] ([id number]) -------------------------------------------------------------------------------- Physician Orders Details Patient Name: [name] Date of Service: 06/17/2017 8:45 AM Medical Record Number: [id number] Patient Account Number: [id number] Date of Birth/Sex: 1940/10/30 (77 y.o. M) Treating RN: Cornell Barman Primary Care Provider: Cyndi Bender Other Clinician: Referring Provider: Cyndi Bender Treating Provider/Extender: Tito Dine in Treatment: 10 Verbal / Phone Orders: No Diagnosis Coding Wound Cleansing Wound #6 Right,Lateral Malleolus o Clean wound with Normal Saline. Wound #8 Right,Lateral [name] o Clean wound with Normal Saline. Wound #9 Left Metatarsal head first o Clean wound with Normal Saline. Anesthetic (add to Medication List) Wound #6 Right,Lateral Malleolus o Topical Lidocaine 4% cream applied to wound bed prior to debridement (In Clinic Only). Wound #8 Right,Lateral [name] o Topical Lidocaine 4% cream applied to wound bed prior to debridement (In Clinic Only). Wound #9 Left Metatarsal head first o Topical Lidocaine 4% cream applied to wound bed prior to debridement (In Clinic Only). Primary Wound Dressing Wound #6 Right,Lateral Malleolus o Santyl Ointment Wound #8 Right,Lateral [name] o Santyl Ointment Wound #9 Left Metatarsal head first o Collagen Secondary Dressing Wound #6 Right,Lateral Malleolus o ABD pad Wound #8 Right,Lateral [name] o ABD pad Wound #9 Left Metatarsal head first o Boardered Foam Dressing Dressing Change Frequency Wound #6 Right,Lateral Malleolus o Change Dressing Monday, Wednesday, Friday MARVEL, MCPHILLIPS ([id number]) Wound #8 Right,Lateral [name] o Change Dressing Monday, Wednesday, Friday Wound #9 Left Metatarsal head  first o Change Dressing Monday, Wednesday, Friday Follow-up Appointments Wound #6 Right,Lateral Malleolus o Return Appointment in 1 week. Wound #8 Right,Lateral [name] o Return Appointment in 1 week. Wound #9 Left Metatarsal head first o Return Appointment in 1 week. Edema Control Wound #6 Right,Lateral Malleolus o Kerlix and Coban - Right Lower Extremity o Elevate legs to the level of the heart and pump ankles as often as possible Wound #8 Right,Lateral [name] o Kerlix and Coban - Right Lower Extremity o Elevate legs to the level of the heart and pump ankles as often as possible Home Health Wound #6 [place] Nurse may visit PRN to address patientos wound care needs. o FACE TO FACE ENCOUNTER: MEDICARE and MEDICAID PATIENTS: I certify that this patient is under my care and that I had a face-to-face encounter that meets the physician face-to-face encounter requirements with this patient on this date. The encounter with the patient was in whole or in part for the following MEDICAL CONDITION: (primary reason for [place]) MEDICAL NECESSITY: I certify, that based on my findings, NURSING services are a medically necessary home health service. HOME BOUND STATUS: I certify that my clinical findings support that this patient is homebound (i.e., Due to illness or injury, pt requires aid of supportive devices such  as crutches, cane, wheelchairs, walkers, the use of special transportation or the assistance of another person to leave their place of residence. There is a normal inability to leave the home and doing so requires considerable and taxing effort. Other absences are for medical reasons / religious services and are infrequent or of short duration when for other reasons). o If current dressing causes regression in wound condition, may D/C ordered dressing product/s and apply Normal Saline Moist Dressing  daily until next [place] / Other MD appointment. [place] of regression in wound condition at [phone]. o Please direct any NON-WOUND related issues/requests for orders to patient's Primary Care Physician Wound #8 [place] Nurse may visit PRN to address patientos wound care needs. o FACE TO FACE ENCOUNTER: MEDICARE and MEDICAID PATIENTS: I certify that this patient is under my care and that I had a face-to-face encounter that meets the physician face-to-face encounter requirements with this patient on this date. The encounter with the patient was in whole or in part for the following MEDICAL CONDITION: (primary reason for [place]) MEDICAL NECESSITY: I certify, that based on my findings, NURSING services are a medically necessary home health service. HOME BOUND STATUS: I certify that my clinical findings support that this patient is homebound (i.e., Due to illness or injury, pt requires aid of supportive devices such as crutches, cane, wheelchairs, walkers, the use of special transportation or the assistance of another person to leave their place of residence. There is a normal inability to leave the home Staniszewski, Paco E. ([id number]) and doing so requires considerable and taxing effort. Other absences are for medical reasons / religious services and are infrequent or of short duration when for other reasons). o If current dressing causes regression in wound condition, may D/C ordered dressing product/s and apply Normal Saline Moist Dressing daily until next [place] / Other MD appointment. [place] of regression in wound condition at [phone]. o Please direct any NON-WOUND related issues/requests for orders to patient's Primary Care Physician Wound #9 Left Metatarsal head first o [place] Nurse may visit PRN to  address patientos wound care needs. o FACE TO FACE ENCOUNTER: MEDICARE and MEDICAID PATIENTS: I certify that this patient is under my care and that I had a face-to-face encounter that meets the physician face-to-face encounter requirements with this patient on this date. The encounter with the patient was in whole or in part for the following MEDICAL CONDITION: (primary reason for [place]) MEDICAL NECESSITY: I certify, that based on my findings, NURSING services are a medically necessary home health service. HOME BOUND STATUS: I certify that my clinical findings support that this patient is homebound (i.e., Due to illness or injury, pt requires aid of supportive devices such as crutches, cane, wheelchairs, walkers, the use of special transportation or the assistance of another person to leave their place of residence. There is a normal inability to leave the home and doing so requires considerable and taxing effort. Other absences are for medical reasons / religious services and are infrequent or of short duration when for other reasons). o If current dressing causes regression in wound condition, may D/C ordered dressing product/s and apply Normal Saline Moist Dressing daily until next [place] / Other MD appointment. [place] of regression in wound condition at [phone]. o Please direct any NON-WOUND related issues/requests for orders to  patient's Primary Care Physician Medications-please add to medication list. Wound #6 Right,Lateral Malleolus o P.O. Antibiotics - Continue Antibiotics as prescribed. Wound #8 Right,Lateral [name] o P.O. Antibiotics - Continue Antibiotics as prescribed. Wound #9 Left Metatarsal head first o P.O. Antibiotics - Continue Antibiotics as prescribed. Notes Follow up with [facility] for arteriogram Electronic Signature(s) Signed: 06/17/2017 4:49:36 PM By: Linton Ham MD Signed: 06/18/2017 8:47:48 AM By: Gretta Cool, BSN,  RN, CWS, Kim RN, BSN Entered By: Gretta Cool, BSN, RN, CWS, Kim on 06/17/2017 09:40:00 Herrod, [name] ([id number]) -------------------------------------------------------------------------------- Problem List Details Patient Name: [name] Date of Service: 06/17/2017 8:45 AM Medical Record Number: [id number] Patient Account Number: [id number] Date of Birth/Sex: Feb 22, 1940 (77 y.o. 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 limited to 04/08/2017 No Yes breakdown of skin L97.211 Non-pressure chronic ulcer of right calf limited to breakdown 04/08/2017 No Yes of skin L97.511 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 limited to 04/08/2017 No Yes breakdown of skin I25.5 Ischemic cardiomyopathy 04/08/2017 No Yes Inactive Problems Resolved Problems Electronic Signature(s) Signed: 06/17/2017 4:49:36 PM By: Linton Ham MD Entered By: Linton Ham on 06/17/2017 09:56:32 Boyko, [name] ([id number]) -------------------------------------------------------------------------------- Progress Note Details Patient Name: [name] Date of Service: 06/17/2017 8:45 AM Medical Record Number: [id number] Patient Account Number: [id number] Date of Birth/Sex: 1940/06/16 (77 y.o. M) Treating RN: Primary Care Provider: Cyndi Bender Other Clinician: Referring Provider: Cyndi Bender Treating Provider/Extender: Tito Dine in Treatment: 10 Subjective History of Present Illness (HPI) 04/08/17; this is a complex 77 year old man referred here from [facility]. He had been referred there for bilateral lower extremity edema with ulcer formation predominantly on the right  calf but also the right [name]. He had been receiving Unna boots bilaterally. The history here is long. He is not a diabetic however ICU looking through late 2018 he was worked up for chronic headaches, elevated inflammatory markers including C-reactive protein and ESR. He went on to actually have a left temporal artery biopsy wasn't that was negative.he received about 6 weeks of high-dose prednisone 60 mg with improvement in his inflammatory markers. He was admitted to hospital in late November with ventricular tachycardia syncope. He has known ischemic cardiomyopathy. He was admitted in the hospital in mid December. Apparently this was precipitated by a syncopal spell falling out of his scooter while at [place]. There was ventricular arrhythmia. He has an implantable defibrillator and echocardiogram showed severe LV dysfunction with an EF of 20% and valvular regurgitations including mild AR, moderate MR. There was no stenosis. He ruled in for a non-ST elevation MI in the setting of V. tach.his wife states that sometime during this hospitalization she noted multiple areas of skin change on the right lower calf which became evident just after he left the hospital. He was back in hospital in February with acute renal failure hyponatremia. This responded to fluid resuscitation.. Interestingly I can't see much description of his right leg at that point in time.he was followed by Dr. Nehemiah Massed of dermatology for the necrotic wounds on his right leg. Apparently a biopsy was planned at one point but not done although in some notes that suggests it was. I cannot see these results area He was noted to have a lot of edema. Was treated  with bilateral Unna boots edges really helped with the swelling they have been using Bactroban to small open areas predominantly on the right anterior lower leg His history is complicated by the fact that he has rheumatoid arthritis followed by rheumatology. He is followed by  neurology for disabling headaches. At one point this was felt to be giant cell arteritis although a left temporal biopsy was apparently negative. He was given a prolonged course of prednisone at 60 mg which managed his sedimentation rates but apparently did not prove improve the headaches. This is been tapered to off on by rheumatology on 03/13/17 Vascular had plans to do a venous reflux workup as well as arterial studies in May. They also wanted to get him a lymphedema pump. As mentioned he's been using bacitracin under Unna boot wraps to both lower legs 04/15/17; the patient arrives with most of his wounds improved. These are small punched out wounds. Most of them remaining ones are on the right anterior calf with the most problematic over the right lateral malleolus. There are no new areas. The symptom complex or potential symptom complex we are dealing with his chronic disabling headaches with inflammatory markers not responsive to prednisone and with a negative temporal biopsy, lower extremity weakness, skin ulcerations just on the right leg. We have managed to get his arterial studies moved up to April 30. He has a rheumatology consult at [facility] in June. He has seen dermatology locally, rheumatology locally, neurology locally. 04/22/17; small punched out areas on the right leg anteriorly posteriorly. Most of these appear to have closed over. Some of them have eschar over the surface. The most problematic area appears to be over the right lateral malleolus. We've been using prisma to all of this. He has arterial studies on April 30 and a rheumatology consult at [facility] on June 28 04/29/17; most of the small punched out areas on the right leg posteriorly are closed. He has 2 or 3 openings anteriorly but most of these appear to be on the way to closing. Still problematically over the right lateral malleolus and right lateral [name] with almost ischemic-looking eschar. His arterial studies that I ordered are  due to be done next week on the 30th so we should have them available for our next visit hopefully. He also saw a rheumatologist at [facility] and according to the patient he did 8 vials of blood. Finally he has scaling rash on his left [name] and what looks to be at [facility] area on the left anterior leg 05/06/17; most of the small punched out areas on the right leg posteriorly and anteriorly are closed. He still has one small one Lavis, Henrick E. ([id number]) anteriorly one over the right lateral malleolus and one over the right lateral [name]. He had his arterial studies they didn't seem to do waveform analysis not exactly sure why however in any case is ABIs were noncompressible bilaterally. They did provide TBIs although looking at the pressures it appears that his TBIs are quite normal. I therefore went ahead and debrided the area over the right lateral malleolus and the right lateral [name] 05/13/17; most of the small punched out areas on the right leg posteriorly and anteriorly are closed. He continues to have problematic areas over the right lateral malleolus and the right lateral [name]. He still requiring aggressive debridement of these 2 wounds using silver collagen I reviewed the note from rheumatology I don't think they came up with a specific diagnosis although he is known to  have seropositive RA. They did a panel of lab work when I was able to see his his AMA was negative, anti-smooth muscle antibodies negative antineutrophil cytoplasmic antibodies negative,Liv/kia type 1 negative. Serum C3 and C4 were negative. I don't see his muscle enzymes specifically. He was referred back to his local rheumatologist for management of his known rheumatoid arthritis.the patient states he still feels weak and fatigued. He states he has numbness in both feet and apparently is known to have neuropathy 05/20/17; the patient continues to have a difficult problem on the right lateral malleolus and the right lateral  [name]. Both of these wounds have no viable surface even with attempts at debridement. He has a new wound on the left plantar metatarsal head which looks more like a superficial diabetic pressure related injury then part of this underlying issue he has. Most of the rest of the wounds on his legs look satisfactory. Mostly on the right calf.reviewed his arterial studies which showed noncompressible vessels bilaterally but the TBIs were quite normal. 05/27/17; no real improvement in the right lateral malleolus and right lateral [name]. In fact the right lateral [name] is now on bone. I gave him doxycycline empirically last week it appears that he developed photosensitivity was 3 the son in a tractor. I'll not give him any more of this. This is predominantly on his face and dorsal forearms and hands. I given this more as an anti- inflammatory. I'm going to have him seen by vascular surgery. His TBIs that he had done previously ordered by Dr. Brigitte Pulse were in the normal range They never did full arterial studies on him. he now has small punched out wounds on the right lateral ankle and right lateral [name]. I doubt these are ischemic however I would like a review of his macrovascular status. He does describe some pain at night. I'll reduce the compression from 3 layer to 2 layer and I'm not convinced that this is a macrovascular issue however I want to make sure. We've been using silver alginate 06/03/17; the patient's x-rays that I ordered last time of his right ankle and right lateral [name] did not show definite osteomyelitis. We've been using silver alginate. The wounds are not making any progress. The area on the plantar left first metatarsal head however appears to be better. The patient complains of weakness that is more of the fatigue. He says if he's walking his head will fall onto his chest and that his legs literally gave out on him. He did see neurology in the past however that was at a time where his  workup was for temporal arteritis and headaches. 06/10/17; the patient is making no progress with the areas on the right lateral [name] and right lateral malleolus. In fact the area on the [name] probes to bone. X-ray did not show osteomyelitis. He went and saw vein and vascular on 06/04/17 he was felt to have significant reflux in the left greater saphenous vein over this is not in the area we are most concerned about. He could be offered ablation. He was not felt to have venous reflux noted in the right lower extremity. He was felt to have some degree of lymphedema and he was felt to be a candidate for compression pumps. Finally a diagnostic arteriogram was suggested which is really what I'm most interested in. The patient has wife wanted time to think about this, I think they were confused about interacting between venous and arterial discussions. I think it would be probably  well worth going through the angiogram. Culture I did have the deeper area on the right lateral [name] showed a few Enterococcus faecalis. I would like to start her on amoxicillin which they will start today for one-week 500 3 times a day 06/17/17; the patient still has punched out areas on the right lateral [name] and right lateral malleolus. There is not a viable surface here. We have been using Santyl. He also has an area on the plantar aspect of his left first metatarsal head this also seems to be better Objective Constitutional Alleva, YOSKAR MURRILLO. ([id number]) Patient is hypotensive.he however appears well. Pulse regular and within target range for patient.[name] Respirations regular, non- labored and within target range.. Temperature is normal and within the target range for the patient.[name] appears in no distress. Vitals Time Taken: 8:45 AM, Height: 68 in, Temperature: 97.6 F, Pulse: 70 bpm, Respiratory Rate: 18 breaths/min, Blood Pressure: 94/58 mmHg. Eyes Conjunctivae clear. No discharge. Respiratory Respiratory effort is easy  and symmetric bilaterally. Rate is normal at rest and on room air.. Cardiovascular pedal pulses faintly palpable bilaterally. Lymphatic none palpable in the popliteal area bilaterally. Psychiatric No evidence of depression, anxiety, or agitation. Calm, cooperative, and communicative. Appropriate interactions and affect.. General Notes: exam; multiple areas on the posterior and anterior part of his right calf remain closed. Deep holes on the right lateral malleolus and right lateral [name] remain open. There is no viable surface here although I've been attempting to avoid debridement giving the Santyl time to work. There is no evidence of surrounding infection Small area on the plantar left first metatarsal head looks better and more superficial Integumentary (Hair, Skin) the areas on his calf appear to be stable. Start. Wound #6 status is Open. Original cause of wound was Gradually Appeared. The wound is located on the Right,Lateral Malleolus. The wound measures 0.7cm length x 0.4cm width x 0.4cm depth; 0.22cm^2 area and 0.088cm^3 volume. There is no tunneling or undermining noted. There is a large amount of serous drainage noted. The wound margin is distinct with the outline attached to the wound base. There is medium (34-66%) red, pink granulation within the wound bed. There is a medium (34-66%) amount of necrotic tissue within the wound bed including Adherent Slough. The periwound skin appearance exhibited: Callus. The periwound skin appearance did not exhibit: Crepitus, Excoriation, Induration, Rash, Scarring, Dry/Scaly, Maceration, Atrophie Blanche, Cyanosis, Ecchymosis, Hemosiderin Staining, Mottled, Pallor, Rubor, Erythema. Periwound temperature was noted as No Abnormality. The periwound has tenderness on palpation. Wound #8 status is Open. Original cause of wound was Gradually Appeared. The wound is located on the Right,Lateral [name]. The wound measures 0.8cm length x 0.8cm width x 0.2cm  depth; 0.503cm^2 area and 0.101cm^3 volume. There is bone and Fat Layer (Subcutaneous Tissue) Exposed exposed. There is no tunneling or undermining noted. There is a large amount of serous drainage noted. The wound margin is distinct with the outline attached to the wound base. There is no granulation within the wound bed. There is a large (67-100%) amount of necrotic tissue within the wound bed including Adherent Slough. The periwound skin appearance exhibited: Callus, Maceration. Periwound temperature was noted as No Abnormality. The periwound has tenderness on palpation. Wound #9 status is Open. Original cause of wound was Gradually Appeared. The wound is located on the Left Metatarsal head first. The wound measures 0.3cm length x 0.4cm width x 0.1cm depth; 0.094cm^2 area and 0.009cm^3 volume. There is Fat Layer (Subcutaneous Tissue) Exposed exposed. There is no tunneling or  undermining noted. There is a large amount of serous drainage noted. The wound margin is flat and intact. There is no granulation within the wound bed. There is a large (67-100%) amount of necrotic tissue within the wound bed including Adherent Slough. The periwound skin appearance exhibited: Maceration. The periwound skin appearance did not exhibit: Callus, Crepitus, Excoriation, Induration, Rash, Scarring, Dry/Scaly, Atrophie Blanche, Cyanosis, Ecchymosis, Hemosiderin Staining, Mottled, Pallor, Rubor, Erythema. Periwound temperature was noted as No Abnormality. 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 Wound #8 Right,Lateral [name]: Clean wound with  Normal Saline. Wound #9 Left Metatarsal head first: Clean wound with Normal Saline. Anesthetic (add to Medication List): Wound #6 Right,Lateral Malleolus: Topical Lidocaine 4% cream applied to wound bed prior to debridement (In Clinic Only). Wound #8 Right,Lateral [name]: Topical Lidocaine 4% cream applied to wound bed prior to debridement (In Clinic Only). Wound #9 Left Metatarsal head first: Topical Lidocaine 4% cream applied to wound bed prior to debridement (In Clinic Only). Primary Wound Dressing: Wound #6 Right,Lateral Malleolus: Santyl Ointment Wound #8 Right,Lateral [name]: Santyl Ointment Wound #9 Left Metatarsal head first: Collagen Secondary Dressing: Wound #6 Right,Lateral Malleolus: ABD pad Wound #8 Right,Lateral [name]: ABD pad Wound #9 Left Metatarsal head first: Boardered Foam Dressing Dressing Change Frequency: Wound #6 Right,Lateral Malleolus: Change Dressing Monday, Wednesday, Friday Wound #8 Right,Lateral [name]: Change Dressing Monday, Wednesday, Friday Wound #9 Left Metatarsal head first: Change Dressing Monday, Wednesday, Friday Follow-up Appointments: Wound #6 Right,Lateral Malleolus: Return Appointment in 1 week. Wound #8 Right,Lateral [name]: Return Appointment in 1 week. Wound #9 Left Metatarsal head first: RAMZY, CAPPELLETTI ([id number]) Return Appointment in 1 week. Edema Control: Wound #6 Right,Lateral Malleolus: Kerlix and Coban - Right Lower Extremity Elevate legs to the level of the heart and pump ankles as often as possible Wound #8 Right,Lateral [name]: Kerlix and Coban - Right Lower Extremity Elevate legs to the level of the heart and pump ankles as often as possible Home Health: Wound #6 Right,Lateral Malleolus: [place] Nurse may visit PRN to address patient s wound care needs. FACE TO FACE ENCOUNTER: MEDICARE and MEDICAID PATIENTS: I certify that this patient is under my care and that I had a face-to-face  encounter that meets the physician face-to-face encounter requirements with this patient on this date. The encounter with the patient was in whole or in part for the following MEDICAL CONDITION: (primary reason for [place]) MEDICAL NECESSITY: I certify, that based on my findings, NURSING services are a medically necessary home health service. HOME BOUND STATUS: I certify that my clinical findings support that this patient is homebound (i.e., Due to illness or injury, pt requires aid of supportive devices such as crutches, cane, wheelchairs, walkers, the use of special transportation or the assistance of another person to leave their place of residence. There is a normal inability to leave the home and doing so requires considerable and taxing effort. Other absences are for medical reasons / religious services and are infrequent or of short duration when for other reasons). If current dressing causes regression in wound condition, may D/C ordered dressing product/s and apply Normal Saline Moist Dressing daily until next [place] /  Other MD appointment. [place] of regression in wound condition at [phone]. Please direct any NON-WOUND related issues/requests for orders to patient's Primary Care Physician Wound #8 Right,Lateral [name]: [place] Nurse may visit PRN to address patient s wound care needs. FACE TO FACE ENCOUNTER: MEDICARE and MEDICAID PATIENTS: I certify that this patient is under my care and that I had a face-to-face encounter that meets the physician face-to-face encounter requirements with this patient on this date. The encounter with the patient was in whole or in part for the following MEDICAL CONDITION: (primary reason for [place]) MEDICAL NECESSITY: I certify, that based on my findings, NURSING services are a medically necessary home health service. HOME BOUND STATUS: I certify that my clinical  findings support that this patient is homebound (i.e., Due to illness or injury, pt requires aid of supportive devices such as crutches, cane, wheelchairs, walkers, the use of special transportation or the assistance of another person to leave their place of residence. There is a normal inability to leave the home and doing so requires considerable and taxing effort. Other absences are for medical reasons / religious services and are infrequent or of short duration when for other reasons). If current dressing causes regression in wound condition, may D/C ordered dressing product/s and apply Normal Saline Moist Dressing daily until next [place] / Other MD appointment. [place] of regression in wound condition at [phone]. Please direct any NON-WOUND related issues/requests for orders to patient's Primary Care Physician Wound #9 Left Metatarsal head first: [place] Nurse may visit PRN to address patient s wound care needs. FACE TO FACE ENCOUNTER: MEDICARE and MEDICAID PATIENTS: I certify that this patient is under my care and that I had a face-to-face encounter that meets the physician face-to-face encounter requirements with this patient on this date. The encounter with the patient was in whole or in part for the following MEDICAL CONDITION: (primary reason for [place]) MEDICAL NECESSITY: I certify, that based on my findings, NURSING services are a medically necessary home health service. HOME BOUND STATUS: I certify that my clinical findings support that this patient is homebound (i.e., Due to illness or injury, pt requires aid of supportive devices such as crutches, cane, wheelchairs, walkers, the use of special transportation or the assistance of another person to leave their place of residence. There is a normal inability to leave the home and doing so requires considerable and taxing effort. Other absences are for  medical reasons / religious services and are infrequent or of short duration when for other reasons). If current dressing causes regression in wound condition, may D/C ordered dressing product/s and apply Normal Saline Moist Dressing daily until next [place] / Other MD appointment. [place] of regression in wound condition at [phone]. Please direct any NON-WOUND related issues/requests for orders to patient's Primary Care Physician Medications-please add to medication list.: Wound #6 Right,Lateral Malleolus: P.O. Antibiotics - Continue Antibiotics as prescribed. LUKASZ, ROGUS ([id number]) Wound #8 Right,Lateral [name]: P.O. Antibiotics - Continue Antibiotics as prescribed. Wound #9 Left Metatarsal head first: P.O. Antibiotics - Continue Antibiotics as prescribed. General Notes: Follow up with [facility] for arteriogram #1 we'll continue with Santyl to both wounds on the right [name] #2collagen the left first metatarsal head #3 I had a long discussion with him about the arteriogram on the right. I think this is worth the risk of potential side effects  in order to make sure the patient does not have significant macrovascular disease on the right that is going to affect healing of the wounds we know about and/or wounds intentionally down the road. Electronic Signature(s) Signed: 06/17/2017 4:49:36 PM By: Linton Ham MD Entered By: Linton Ham on 06/17/2017 10:06:27 Varden, [name] ([id number]) -------------------------------------------------------------------------------- SuperBill Details Patient Name: [name] Date of Service: 06/17/2017 Medical Record Number: [id number] Patient Account Number: [id number] Date of Birth/Sex: Feb 16, 1940 (77 y.o. 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

## 2017-06-22 DIAGNOSIS — S81811D Laceration without foreign body, right lower leg, subsequent encounter: Secondary | ICD-10-CM | POA: Diagnosis not present

## 2017-06-22 DIAGNOSIS — I255 Ischemic cardiomyopathy: Secondary | ICD-10-CM | POA: Diagnosis not present

## 2017-06-22 DIAGNOSIS — I5023 Acute on chronic systolic (congestive) heart failure: Secondary | ICD-10-CM | POA: Diagnosis not present

## 2017-06-22 DIAGNOSIS — M069 Rheumatoid arthritis, unspecified: Secondary | ICD-10-CM | POA: Diagnosis not present

## 2017-06-22 DIAGNOSIS — I110 Hypertensive heart disease with heart failure: Secondary | ICD-10-CM | POA: Diagnosis not present

## 2017-06-22 DIAGNOSIS — I2510 Atherosclerotic heart disease of native coronary artery without angina pectoris: Secondary | ICD-10-CM | POA: Diagnosis not present

## 2017-06-24 ENCOUNTER — Encounter: Payer: Medicare Other | Admitting: Internal Medicine

## 2017-06-24 DIAGNOSIS — I70233 Atherosclerosis of native arteries of right leg with ulceration of ankle: Secondary | ICD-10-CM | POA: Diagnosis not present

## 2017-06-24 DIAGNOSIS — L97311 Non-pressure chronic ulcer of right ankle limited to breakdown of skin: Secondary | ICD-10-CM | POA: Diagnosis not present

## 2017-06-24 DIAGNOSIS — I890 Lymphedema, not elsewhere classified: Secondary | ICD-10-CM | POA: Diagnosis not present

## 2017-06-24 DIAGNOSIS — L97512 Non-pressure chronic ulcer of other part of right foot with fat layer exposed: Secondary | ICD-10-CM | POA: Diagnosis not present

## 2017-06-24 DIAGNOSIS — L97511 Non-pressure chronic ulcer of other part of right foot limited to breakdown of skin: Secondary | ICD-10-CM | POA: Diagnosis not present

## 2017-06-24 DIAGNOSIS — L97521 Non-pressure chronic ulcer of other part of left foot limited to breakdown of skin: Secondary | ICD-10-CM | POA: Diagnosis not present

## 2017-06-24 DIAGNOSIS — I70235 Atherosclerosis of native arteries of right leg with ulceration of other part of foot: Secondary | ICD-10-CM | POA: Diagnosis not present

## 2017-06-24 DIAGNOSIS — B952 Enterococcus as the cause of diseases classified elsewhere: Secondary | ICD-10-CM | POA: Diagnosis not present

## 2017-06-24 DIAGNOSIS — L97312 Non-pressure chronic ulcer of right ankle with fat layer exposed: Secondary | ICD-10-CM | POA: Diagnosis not present

## 2017-06-24 DIAGNOSIS — I255 Ischemic cardiomyopathy: Secondary | ICD-10-CM | POA: Diagnosis not present

## 2017-06-26 DIAGNOSIS — I2510 Atherosclerotic heart disease of native coronary artery without angina pectoris: Secondary | ICD-10-CM | POA: Diagnosis not present

## 2017-06-26 DIAGNOSIS — I5023 Acute on chronic systolic (congestive) heart failure: Secondary | ICD-10-CM | POA: Diagnosis not present

## 2017-06-26 DIAGNOSIS — I110 Hypertensive heart disease with heart failure: Secondary | ICD-10-CM | POA: Diagnosis not present

## 2017-06-26 DIAGNOSIS — S81811D Laceration without foreign body, right lower leg, subsequent encounter: Secondary | ICD-10-CM | POA: Diagnosis not present

## 2017-06-26 DIAGNOSIS — M069 Rheumatoid arthritis, unspecified: Secondary | ICD-10-CM | POA: Diagnosis not present

## 2017-06-26 DIAGNOSIS — I255 Ischemic cardiomyopathy: Secondary | ICD-10-CM | POA: Diagnosis not present

## 2017-06-26 NOTE — Progress Notes (Signed)
MATHIUS, BIRKELAND ([id number]) Visit Report for 06/24/2017 Arrival Information Details Patient Name: [name], [name] Date of Service: 06/24/2017 8:30 AM Medical Record Number: [id number] Patient Account Number: [id number] Date of Birth/Sex: May 16, 1940 (76 y.o. M) Treating RN: Roger Shelter Primary Care Sevilla Murtagh: Cyndi Bender Other Clinician: Referring Roe Wilner: Cyndi Bender Treating Shelbia Scinto/Extender: Tito Dine in Treatment: 11 Visit Information History Since Last Visit All ordered tests and consults were completed: No Patient Arrived: Cane Added or deleted any medications: No Arrival Time: 08:45 Any new allergies or adverse reactions: No Accompanied By: spouse Had a fall or experienced change in No Transfer Assistance: None activities of daily living that may affect Patient Identification Verified: Yes risk of falls: Secondary Verification Process Completed: Yes Signs or symptoms of abuse/neglect since last visito No Patient Requires Transmission-Based No Hospitalized since last visit: No Precautions: Implantable device outside of the clinic excluding No Patient Has Alerts: Yes cellular tissue based products placed in the center Patient Alerts: R ABI >220 since last visit: L ABI Pain Present Now: No >220 Electronic Signature(s) Signed: 06/24/2017 4:10:19 PM By: Roger Shelter Entered By: Roger Shelter on 06/24/2017 08:45:45 Sandoz, [name] ([id number]) -------------------------------------------------------------------------------- Encounter Discharge Information Details Patient Name: [name] Date of Service: 06/24/2017 8:30 AM Medical Record Number: [id number] Patient Account Number: [id number] Date of Birth/Sex: December 28, 1940 (76 y.o. M) Treating RN: Montey Hora Primary Care Jermari Tamargo: Cyndi Bender Other Clinician: Referring Kelii Chittum: Cyndi Bender Treating Sue Mcalexander/Extender: Tito Dine in Treatment:  11 Encounter Discharge Information Items Discharge Condition: Stable Ambulatory Status: Cane Discharge Destination: Home Transportation: Private Auto Accompanied By: spouse Schedule Follow-up Appointment: Yes Clinical Summary of Care: Electronic Signature(s) Signed: 06/24/2017 10:17:01 AM By: Montey Hora Entered By: Montey Hora on 06/24/2017 10:17:01 Gangwer, [name] ([id number]) -------------------------------------------------------------------------------- Lower Extremity Assessment Details Patient Name: [name] Date of Service: 06/24/2017 8:30 AM Medical Record Number: [id number] Patient Account Number: [id number] Date of Birth/Sex: 17-Apr-1940 (76 y.o. M) Treating RN: Roger Shelter Primary Care Keora Eccleston: Cyndi Bender Other Clinician: Referring Katreena Schupp: Cyndi Bender Treating Robertta Halfhill/Extender: Tito Dine in Treatment: 11 Edema Assessment Assessed: [Left: No] [Right: No] Edema: [Left: Yes] [Right: Yes] Calf Left: Right: Point of Measurement: 27 cm From Medial Instep 31.6 cm 32.5 cm Ankle Left: Right: Point of Measurement: 11 cm From Medial Instep 25.4 cm 22.3 cm Vascular Assessment Claudication: Claudication Assessment [Left:None] [Right:None] Pulses: Dorsalis Pedis Palpable: [Left:Yes] [Right:Yes] Posterior Tibial Extremity colors, hair growth, and conditions: Extremity Color: [Left:Pale] [Right:Normal] Hair Growth on Extremity: [Left:Yes] [Right:Yes] Temperature of Extremity: [Left:Cool] [Right:Cool] Capillary Refill: [Left:< 3 seconds] [Right:< 3 seconds] Toe Nail Assessment Left: Right: Thick: Yes Yes Discolored: Yes Yes Deformed: Yes Yes Improper Length and Hygiene: Yes Yes Electronic Signature(s) Signed: 06/24/2017 4:10:19 PM By: Roger Shelter Entered By: Roger Shelter on 06/24/2017 08:58:57 Bielicki, [name] ([id number]) -------------------------------------------------------------------------------- Multi Wound  Chart Details Patient Name: [name] Date of Service: 06/24/2017 8:30 AM Medical Record Number: [id number] Patient Account Number: [id number] Date of Birth/Sex: [birth year] (76 y.o. M) Treating RN: Cornell Barman Primary Care Wilma Michaelson: Cyndi Bender Other Clinician: Referring Breella Vanostrand: Cyndi Bender Treating Levie Owensby/Extender: Tito Dine in Treatment: 11 Vital Signs Height(in): 68 Pulse(bpm): 63 Weight(lbs): Blood Pressure(mmHg): 99/55 Body Mass Index(BMI): Temperature(F): 97.6 Respiratory Rate 18 (breaths/min): Photos: [6:No Photos] [8:No Photos] [9:No Photos] Wound Location: [6:Right Malleolus - Lateral] [8:Right [name] - Lateral] [9:Left Metatarsal head first] Wounding Event: [6:Gradually Appeared] [8:Gradually Appeared] [9:Gradually Appeared] Primary Etiology: [6:Arterial Insufficiency Ulcer] [8:Arterial Insufficiency Ulcer] [9:Pressure Ulcer] Comorbid  History: [6:Congestive Heart Failure, Coronary Artery Disease, Hypertension, Myocardial Infarction, Osteoarthritis] [8:Congestive Heart Failure, Coronary Artery Disease, Hypertension, Myocardial Infarction, Osteoarthritis] [9:Congestive Heart  Failure, Coronary Artery Disease, Hypertension, Myocardial Infarction, Osteoarthritis] Date Acquired: [6:03/23/2017] [8:04/29/2017] [9:05/06/2017] Weeks of Treatment: [6:11] [8:8] [9:5] Wound Status: [6:Open] [8:Open] [9:Open] Measurements L x W x D [6:0.7x0.4x0.5] [8:0.7x1x0.2] [9:0.5x0.5x0.1] (cm) Area (cm) : [6:0.22] [8:0.55] [9:0.196] Volume (cm) : [6:0.11] [8:0.11] [9:0.02] % Reduction in Area: [6:-609.70%] [8:-674.60%] [9:0.00%] % Reduction in Volume: [6:-3566.70%] [8:-1471.40%] [9:0.00%] Starting Position 1 [6:12] [8:11] (o'clock): Ending Position 1 [6:6] [8:5] (o'clock): Maximum Distance 1 (cm): [6:0.3] [8:0.3] Undermining: [6:Yes] [8:Yes] [9:No] Classification: [6:Full Thickness Without Exposed Support Structures] [8:Full Thickness With Exposed Support  Structures] [9:Category/Stage II] Exudate Amount: [6:Large] [8:Large] [9:Large] Exudate Type: [6:Serous] [8:Serous] [9:Serous] Exudate Color: [6:amber] [8:amber] [9:amber] Wound Margin: [6:Distinct, outline attached] [8:Distinct, outline attached] [9:Flat and Intact] Granulation Amount: [6:Medium (34-66%)] [8:None Present (0%)] [9:None Present (0%)] Granulation Quality: [6:Red, Pink] [8:N/A] [9:N/A] Necrotic Amount: [6:Medium (34-66%)] [8:Large (67-100%)] [9:Large (67-100%)] Exposed Structures: [6:Fascia: No Fat Layer (Subcutaneous Tissue) Exposed: No Tendon: No Muscle: No] [8:Fat Layer (Subcutaneous Tissue) Exposed: Yes Bone: Yes Fascia: No Tendon: No] [9:Fat Layer (Subcutaneous Tissue) Exposed: Yes Fascia: No Tendon: No Muscle: No] Joint: No Muscle: No Joint: No Bone: No Joint: No Bone: No Epithelialization: None None None Debridement: Debridement - Excisional Debridement - Excisional N/A Pre-procedure 09:12 09:12 N/A Verification/Time Out Taken: Pain Control: Other Other N/A Tissue Debrided: Subcutaneous, Slough Subcutaneous, Slough N/A Level: Skin/Subcutaneous Tissue Skin/Subcutaneous Tissue N/A Debridement Area (sq cm): 0.28 0.7 N/A Instrument: Curette Curette N/A Bleeding: Minimum Minimum N/A Hemostasis Achieved: Pressure Pressure N/A Procedural Pain: 2 2 N/A Post Procedural Pain: 2 2 N/A Debridement Treatment Procedure was tolerated well Procedure was tolerated well N/A Response: Post Debridement 0.7x0.4x0.5 0.7x1x0.3 N/A Measurements L x W x D (cm) Post Debridement Volume: 0.11 0.165 N/A (cm) Periwound Skin Texture: Callus: Yes Callus: Yes Excoriation: No Excoriation: No Induration: No Induration: No Callus: No Crepitus: No Crepitus: No Rash: No Rash: No Scarring: No Scarring: No Periwound Skin Moisture: Maceration: No Maceration: Yes Maceration: Yes Dry/Scaly: No Dry/Scaly: No Periwound Skin Color: Atrophie Blanche: No No Abnormalities  Noted Atrophie Blanche: No Cyanosis: No Cyanosis: No Ecchymosis: No Ecchymosis: No Erythema: No Erythema: No Hemosiderin Staining: No Hemosiderin Staining: No Mottled: No Mottled: No Pallor: No Pallor: No Rubor: No Rubor: No Temperature: No Abnormality No Abnormality No Abnormality Tenderness on Palpation: Yes Yes No Wound Preparation: Ulcer Cleansing: Ulcer Cleansing: Ulcer Cleansing: Rinsed/Irrigated with Saline, Rinsed/Irrigated with Saline, Rinsed/Irrigated with Saline Other: soap and water Other: soap and water Topical Anesthetic Applied: Topical Anesthetic Applied: Topical Anesthetic Applied: Other: lidocaine 4% Other: lidocaine 4% Other: lidocaine 4% Procedures Performed: Debridement Debridement N/A Treatment Notes Electronic Signature(s) Signed: 06/24/2017 4:07:49 PM By: Linton Ham MD Entered By: Linton Ham on 06/24/2017 09:37:41 Aja, [name] ([id number]) -------------------------------------------------------------------------------- Multi-Disciplinary Care Plan Details Patient Name: [name] Date of Service: 06/24/2017 8:30 AM Medical Record Number: [id number] Patient Account Number: [id number] Date of Birth/Sex: 12-29-1940 (76 y.o. M) Treating RN: Cornell Barman Primary Care Mansel Strother: Cyndi Bender Other Clinician: Referring Jerrett Baldinger: Cyndi Bender Treating Zeven Kocak/Extender: Tito Dine in Treatment: 11 Active Inactive ` Abuse / Safety / Falls / Self Care Management Nursing Diagnoses: History of Falls Goals: Patient will remain injury free related to falls Date Initiated: 04/08/2017 Target Resolution Date: 05/08/2017 Goal Status: Active Interventions: Assess fall risk on admission and as needed Notes: ` Orientation to the Wound Care Program Nursing Diagnoses:  Knowledge deficit related to the wound healing center program Goals: Patient/caregiver will verbalize understanding of the [facility] Date Initiated: 04/08/2017 Target Resolution Date: 05/08/2017 Goal Status: Active Interventions: Provide education on orientation to the wound center Notes: ` Soft Tissue Infection Nursing Diagnoses: Impaired tissue integrity Potential for infection: soft tissue Goals: Patient will remain free of wound infection Date Initiated: 04/08/2017 Target Resolution Date: 05/08/2017 Goal Status: Active LEVELLE, EDELEN ([id number]) Interventions: Assess signs and symptoms of infection every visit Notes: ` Wound/Skin Impairment Nursing Diagnoses: Impaired tissue integrity Goals: Ulcer/skin breakdown will heal within 14 weeks Date Initiated: 04/08/2017 Target Resolution Date: 07/20/2017 Goal Status: Active Interventions: Assess patient/caregiver ability to perform ulcer/skin care regimen upon admission and as needed Provide education on ulcer and skin care Treatment Activities: Topical wound management initiated : 04/08/2017 Notes: Electronic Signature(s) Signed: 06/24/2017 4:19:21 PM By: Gretta Cool, BSN, RN, CWS, Kim RN, BSN Entered By: Gretta Cool, BSN, RN, CWS, Kim on 06/24/2017 09:10:16 [place], [name] ([id number]) -------------------------------------------------------------------------------- Pain Assessment Details Patient Name: [name] Date of Service: 06/24/2017 8:30 AM Medical Record Number: [id number] Patient Account Number: [id number] Date of Birth/Sex: May 08, 1940 (76 y.o. M) Treating RN: Roger Shelter Primary Care Kentavious Michele: Cyndi Bender Other Clinician: Referring Kairee Isa: Cyndi Bender Treating Brittain Smithey/Extender: Tito Dine in Treatment: 11 Active Problems Location of Pain Severity and Description of Pain Patient Has Paino No Site Locations Pain Management and Medication Current Pain Management: Electronic Signature(s) Signed: 06/24/2017 4:10:19 PM By: Roger Shelter Entered By: Roger Shelter on 06/24/2017 08:45:51 Vanderlinden, [name]  ([id number]) -------------------------------------------------------------------------------- Patient/Caregiver Education Details Patient Name: [name] Date of Service: 06/24/2017 8:30 AM Medical Record Number: [id number] Patient Account Number: [id number] Date of Birth/Gender: March 05, 1940 (76 y.o. M) Treating RN: Montey Hora Primary Care Physician: Cyndi Bender Other Clinician: Referring Physician: Cyndi Bender Treating Physician/Extender: Tito Dine in Treatment: 11 Education Assessment Education Provided To: Patient Education Topics Provided Venous: Handouts: Other: wound care as ordered Methods: Explain/Verbal Responses: State content correctly Electronic Signature(s) Signed: 06/24/2017 3:47:23 PM By: Montey Hora Entered By: Montey Hora on 06/24/2017 10:17:22 Attig, [name] ([id number]) -------------------------------------------------------------------------------- Wound Assessment Details Patient Name: [name] Date of Service: 06/24/2017 8:30 AM Medical Record Number: [id number] Patient Account Number: [id number] Date of Birth/Sex: 1940/07/09 (76 y.o. M) Treating RN: Roger Shelter Primary Care Ardella Chhim: Cyndi Bender Other Clinician: Referring Liylah Najarro: Cyndi Bender Treating Branson Kranz/Extender: Tito Dine in Treatment: 11 Wound Status Wound Number: 6 Primary Arterial Insufficiency Ulcer Etiology: Wound Location: Right Malleolus - Lateral Wound Open Wounding Event: Gradually Appeared Status: Date Acquired: 03/23/2017 Comorbid Congestive Heart Failure, Coronary Artery Weeks Of Treatment: 11 History: Disease, Hypertension, Myocardial Infarction, Clustered Wound: No Osteoarthritis Photos Photo Uploaded By: Roger Shelter on 06/24/2017 16:01:13 Wound Measurements Length: (cm) 0.7 Width: (cm) 0.4 Depth: (cm) 0.5 Area: (cm) 0.22 Volume: (cm) 0.11 % Reduction in Area: -609.7% % Reduction in  Volume: -3566.7% Epithelialization: None Tunneling: No Undermining: Yes Starting Position (o'clock): 12 Ending Position (o'clock): 6 Maximum Distance: (cm) 0.3 Wound Description Full Thickness Without Exposed Support Classification: Structures Wound Margin: Distinct, outline attached Exudate Large Amount: Exudate Type: Serous Exudate Color: amber Foul Odor After Cleansing: No Slough/Fibrino Yes Wound Bed Granulation Amount: Medium (34-66%) Exposed Structure Granulation Quality: Red, Pink Fascia Exposed: No Necrotic Amount: Medium (34-66%) Fat Layer (Subcutaneous Tissue) Exposed: No Hilgeman, Shomari E. ([id number]) Necrotic Quality: Adherent Slough Tendon Exposed: No Muscle Exposed: No Joint Exposed: No Bone Exposed: No Periwound Skin Texture Texture Color No  Abnormalities Noted: No No Abnormalities Noted: No Callus: Yes Atrophie Blanche: No Crepitus: No Cyanosis: No Excoriation: No Ecchymosis: No Induration: No Erythema: No Rash: No Hemosiderin Staining: No Scarring: No Mottled: No Pallor: No Moisture Rubor: No No Abnormalities Noted: No Dry / Scaly: No Temperature / Pain Maceration: No Temperature: No Abnormality Tenderness on Palpation: Yes Wound Preparation Ulcer Cleansing: Rinsed/Irrigated with Saline, Other: soap and water, Topical Anesthetic Applied: Other: lidocaine 4%, Treatment Notes Wound #6 (Right, Lateral Malleolus) 1. Cleansed with: Cleanse wound with antibacterial soap and water 4. Dressing Applied: Santyl Ointment 5. Secondary Dressing Applied ABD Pad 7. Secured with Other (specify in notes) Notes kerlix/coban wrap Electronic Signature(s) Signed: 06/24/2017 4:10:19 PM By: Roger Shelter Entered By: Roger Shelter on 06/24/2017 08:49:53 Halvorson, [name] ([id number]) -------------------------------------------------------------------------------- Wound Assessment Details Patient Name: [name] Date of Service:  06/24/2017 8:30 AM Medical Record Number: [id number] Patient Account Number: [id number] Date of Birth/Sex: 1940-06-27 (76 y.o. M) Treating RN: Roger Shelter Primary Care Renlee Floor: Cyndi Bender Other Clinician: Referring Shakeeta Godette: Cyndi Bender Treating Daurice Ovando/Extender: Tito Dine in Treatment: 11 Wound Status Wound Number: 8 Primary Arterial Insufficiency Ulcer Etiology: Wound Location: Right [name] - Lateral Wound Open Wounding Event: Gradually Appeared Status: Date Acquired: 04/29/2017 Comorbid Congestive Heart Failure, Coronary Artery Weeks Of Treatment: 8 History: Disease, Hypertension, Myocardial Infarction, Clustered Wound: No Osteoarthritis Photos Photo Uploaded By: Roger Shelter on 06/24/2017 16:01:14 Wound Measurements Length: (cm) 0.7 Width: (cm) 1 Depth: (cm) 0.2 Area: (cm) 0.55 Volume: (cm) 0.11 % Reduction in Area: -674.6% % Reduction in Volume: -1471.4% Epithelialization: None Undermining: Yes Starting Position (o'clock): 11 Ending Position (o'clock): 5 Maximum Distance: (cm) 0.3 Wound Description Full Thickness With Exposed Support Classification: Structures Wound Margin: Distinct, outline attached Exudate Large Amount: Exudate Type: Serous Exudate Color: amber Foul Odor After Cleansing: No Slough/Fibrino Yes Wound Bed Granulation Amount: None Present (0%) Exposed Structure Necrotic Amount: Large (67-100%) Fascia Exposed: No Necrotic Quality: Adherent Slough Fat Layer (Subcutaneous Tissue) Exposed: Yes Tendon Exposed: No Newsom, Xion E. ([id number]) Muscle Exposed: No Joint Exposed: No Bone Exposed: Yes Periwound Skin Texture Texture Color No Abnormalities Noted: No No Abnormalities Noted: No Callus: Yes Temperature / Pain Moisture Temperature: No Abnormality No Abnormalities Noted: No Tenderness on Palpation: Yes Maceration: Yes Wound Preparation Ulcer Cleansing: Rinsed/Irrigated with Saline, Other: soap  and water, Topical Anesthetic Applied: Other: lidocaine 4%, Treatment Notes Wound #8 (Right, Lateral [name]) 1. Cleansed with: Cleanse wound with antibacterial soap and water 4. Dressing Applied: Santyl Ointment 5. Secondary Dressing Applied ABD Pad 7. Secured with Other (specify in notes) Notes kerlix/coban wrap Electronic Signature(s) Signed: 06/24/2017 4:10:19 PM By: Roger Shelter Entered By: Roger Shelter on 06/24/2017 08:52:11 Looney, [name] ([id number]) -------------------------------------------------------------------------------- Wound Assessment Details Patient Name: [name] Date of Service: 06/24/2017 8:30 AM Medical Record Number: [id number] Patient Account Number: [id number] Date of Birth/Sex: 1940/04/21 (76 y.o. M) Treating RN: Roger Shelter Primary Care Truitt Cruey: Cyndi Bender Other Clinician: Referring Wiletta Bermingham: Cyndi Bender Treating Maeley Matton/Extender: Tito Dine in Treatment: 11 Wound Status Wound Number: 9 Primary Pressure Ulcer Etiology: Wound Location: Left Metatarsal head first Wound Open Wounding Event: Gradually Appeared Status: Date Acquired: 05/06/2017 Comorbid Congestive Heart Failure, Coronary Artery Weeks Of Treatment: 5 History: Disease, Hypertension, Myocardial Infarction, Clustered Wound: No Osteoarthritis Photos Photo Uploaded By: Roger Shelter on 06/24/2017 16:02:06 Wound Measurements Length: (cm) 0.5 Width: (cm) 0.5 Depth: (cm) 0.1 Area: (cm) 0.196 Volume: (cm) 0.02 % Reduction in Area: 0% % Reduction in Volume: 0% Epithelialization:  None Tunneling: No Undermining: No Wound Description Classification: Category/Stage II Wound Margin: Flat and Intact Exudate Amount: Large Exudate Type: Serous Exudate Color: amber Foul Odor After Cleansing: No Slough/Fibrino Yes Wound Bed Granulation Amount: None Present (0%) Exposed Structure Necrotic Amount: Large (67-100%) Fascia Exposed:  No Necrotic Quality: Adherent Slough Fat Layer (Subcutaneous Tissue) Exposed: Yes Tendon Exposed: No Muscle Exposed: No Joint Exposed: No Bone Exposed: No Periwound Skin Texture Asante, Chaske E. ([id number]) Texture Color No Abnormalities Noted: No No Abnormalities Noted: No Callus: No Atrophie Blanche: No Crepitus: No Cyanosis: No Excoriation: No Ecchymosis: No Induration: No Erythema: No Rash: No Hemosiderin Staining: No Scarring: No Mottled: No Pallor: No Moisture Rubor: No No Abnormalities Noted: No Dry / Scaly: No Temperature / Pain Maceration: Yes Temperature: No Abnormality Wound Preparation Ulcer Cleansing: Rinsed/Irrigated with Saline Topical Anesthetic Applied: Other: lidocaine 4%, Treatment Notes Wound #6 (Right, Lateral Malleolus) 1. Cleansed with: Cleanse wound with antibacterial soap and water 2. Anesthetic Topical Lidocaine 4% cream to wound bed prior to debridement 4. Dressing Applied: Prisma Ag 5. Secondary Dressing Applied Bordered Foam Dressing Electronic Signature(s) Signed: 06/24/2017 4:10:19 PM By: Roger Shelter Entered By: Roger Shelter on 06/24/2017 08:53:11 Binning, [name] ([id number]) -------------------------------------------------------------------------------- Vitals Details Patient Name: [name] Date of Service: 06/24/2017 8:30 AM Medical Record Number: [id number] Patient Account Number: [id number] Date of Birth/Sex: March 14, 1940 (76 y.o. M) Treating RN: Roger Shelter Primary Care Adalee Kathan: Cyndi Bender Other Clinician: Referring Daegon Deiss: Cyndi Bender Treating Malaiyah Achorn/Extender: Tito Dine in Treatment: 11 Vital Signs Time Taken: 08:40 Temperature (F): 97.6 Height (in): 68 Pulse (bpm): 63 Respiratory Rate (breaths/min): 18 Blood Pressure (mmHg): 99/55 Reference Range: 80 - 120 mg / dl Electronic Signature(s) Signed: 06/24/2017 4:10:19 PM By: Roger Shelter Entered By: Roger Shelter on 06/24/2017 08:46:10

## 2017-06-26 NOTE — Progress Notes (Signed)
[name] ([id number]) Visit Report for 06/24/2017 Debridement Details Patient Name: [name] Date of Service: 06/24/2017 8:30 AM Medical Record Number: [id number] Patient Account Number: [id number] Date of Birth/Sex: October 25, 1940 (77 y.o. M) Treating RN: Cornell Barman Primary Care Provider: Cyndi Bender Other Clinician: Referring Provider: Cyndi Bender Treating Provider/Extender: Tito Dine in Treatment: 11 Debridement Performed for Wound #6 Right,Lateral Malleolus Assessment: Performed By: Physician Ricard Dillon, MD Debridement Type: Debridement Severity of Tissue Pre Fat layer exposed Debridement: Pre-procedure Verification/Time Yes - 09:12 Out Taken: Start Time: 09:12 Pain Control: Other : lidocaine 4% Total Area Debrided (L x W): 0.7 (cm) x 0.4 (cm) = 0.28 (cm) Tissue and other material Viable, Non-Viable, Slough, Subcutaneous, Slough debrided: Level: Skin/Subcutaneous Tissue Debridement Description: Excisional Instrument: Curette Bleeding: Minimum Hemostasis Achieved: Pressure End Time: 09:14 Procedural Pain: 2 Post Procedural Pain: 2 Response to Treatment: Procedure was tolerated well Level of Consciousness: Awake and Alert Post Debridement Measurements of Total Wound Length: (cm) 0.7 Width: (cm) 0.4 Depth: (cm) 0.5 Volume: (cm) 0.11 Character of Wound/Ulcer Post Debridement: Stable Severity of Tissue Post Debridement: Fat layer exposed Post Procedure Diagnosis Same as Pre-procedure Electronic Signature(s) Signed: 06/24/2017 4:07:49 PM By: Linton Ham MD Signed: 06/24/2017 4:19:21 PM By: Gretta Cool, BSN, RN, CWS, Kim RN, BSN Entered By: Linton Ham on 06/24/2017 09:37:58 [name] ([id number]) -------------------------------------------------------------------------------- Debridement Details Patient Name: [name] Date of Service: 06/24/2017 8:30 AM Medical Record Number: [id number] Patient Account  Number: [id number] Date of Birth/Sex: [birth year] (77 y.o. M) Treating RN: Cornell Barman Primary Care Provider: Cyndi Bender Other Clinician: Referring Provider: Cyndi Bender Treating Provider/Extender: Tito Dine in Treatment: 11 Debridement Performed for Wound #8 Right,Lateral [name] Assessment: Performed By: Physician Ricard Dillon, MD Debridement Type: Debridement Severity of Tissue Pre Fat layer exposed Debridement: Pre-procedure Verification/Time Yes - 09:12 Out Taken: Start Time: 09:12 Pain Control: Other : lidocaine 4% Total Area Debrided (L x W): 0.7 (cm) x 1 (cm) = 0.7 (cm) Tissue and other material Viable, Non-Viable, Slough, Subcutaneous, Slough debrided: Level: Skin/Subcutaneous Tissue Debridement Description: Excisional Instrument: Curette Bleeding: Minimum Hemostasis Achieved: Pressure End Time: 09:14 Procedural Pain: 2 Post Procedural Pain: 2 Response to Treatment: Procedure was tolerated well Level of Consciousness: Awake and Alert Post Debridement Measurements of Total Wound Length: (cm) 0.7 Width: (cm) 1 Depth: (cm) 0.3 Volume: (cm) 0.165 Character of Wound/Ulcer Post Debridement: Stable Severity of Tissue Post Debridement: Fat layer exposed Post Procedure Diagnosis Same as Pre-procedure Electronic Signature(s) Signed: 06/24/2017 4:07:49 PM By: Linton Ham MD Signed: 06/24/2017 4:19:21 PM By: Gretta Cool, BSN, RN, CWS, Kim RN, BSN Entered By: Linton Ham on 06/24/2017 [name] ([id number]) -------------------------------------------------------------------------------- HPI Details Patient Name: [name] Date of Service: 06/24/2017 8:30 AM Medical Record Number: [id number] Patient Account Number: [id number] Date of Birth/Sex: 02/20/1940 (77 y.o. M) Treating RN: Cornell Barman Primary Care Provider: Cyndi Bender Other Clinician: Referring Provider: Cyndi Bender Treating Provider/Extender: Tito Dine in Treatment: 11 History of Present Illness HPI Description: 04/08/17; this is a complex 77 year old man referred here from [facility]. He had been referred there for bilateral lower extremity edema with ulcer formation predominantly on the right calf but also the right [name]. He had been receiving Unna boots bilaterally. The history here is long. He is not a diabetic however ICU looking through late 2018 he was worked up for chronic headaches, elevated inflammatory markers including C-reactive protein and ESR. He went on to actually  have a left temporal artery biopsy wasn't that was negative.he received about 6 weeks of high-dose prednisone 60 mg with improvement in his inflammatory markers. He was admitted to hospital in late November with ventricular tachycardia syncope. He has known ischemic cardiomyopathy. He was admitted in the hospital in mid December. Apparently this was precipitated by a syncopal spell falling out of his scooter while at [place]. There was ventricular arrhythmia. He has an implantable defibrillator and echocardiogram showed severe LV dysfunction with an EF of 20% and valvular regurgitations including mild AR, moderate MR. There was no stenosis. He ruled in for a non-ST elevation MI in the setting of V. tach.his wife states that sometime during this hospitalization she noted multiple areas of skin change on the right lower calf which became evident just after he left the hospital. He was back in hospital in February with acute renal failure hyponatremia. This responded to fluid resuscitation.. Interestingly I can't see much description of his right leg at that point in time.he was followed by Dr. Nehemiah Massed of dermatology for the necrotic wounds on his right leg. Apparently a biopsy was planned at one point but not done although in some notes that suggests it was. I cannot see these results area He was noted to have a lot of edema. Was treated  with bilateral Unna boots edges really helped with the swelling they have been using Bactroban to small open areas predominantly on the right anterior lower leg His history is complicated by the fact that he has rheumatoid arthritis followed by rheumatology. He is followed by neurology for disabling headaches. At one point this was felt to be giant cell arteritis although a left temporal biopsy was apparently negative. He was given a prolonged course of prednisone at 60 mg which managed his sedimentation rates but apparently did not prove improve the headaches. This is been tapered to off on by rheumatology on 03/13/17 Vascular had plans to do a venous reflux workup as well as arterial studies in May. They also wanted to get him a lymphedema pump. As mentioned he's been using bacitracin under Unna boot wraps to both lower legs 04/15/17; the patient arrives with most of his wounds improved. These are small punched out wounds. Most of them remaining ones are on the right anterior calf with the most problematic over the right lateral malleolus. There are no new areas. The symptom complex or potential symptom complex we are dealing with his chronic disabling headaches with inflammatory markers not responsive to prednisone and with a negative temporal biopsy, lower extremity weakness, skin ulcerations just on the right leg. We have managed to get his arterial studies moved up to April 30. He has a rheumatology consult at [facility] in June. He has seen dermatology locally, rheumatology locally, neurology locally. 04/22/17; small punched out areas on the right leg anteriorly posteriorly. Most of these appear to have closed over. Some of them have eschar over the surface. The most problematic area appears to be over the right lateral malleolus. We've been using prisma to all of this. He has arterial studies on April 30 and a rheumatology consult at [facility] on June 28 04/29/17; most of the small punched out areas on the  right leg posteriorly are closed. He has 2 or 3 openings anteriorly but most of these appear to be on the way to closing. Still problematically over the right lateral malleolus and right lateral [name] with almost ischemic-looking eschar. His arterial studies that I ordered are due to be  done next week on the 30th so we should have them available for our next visit hopefully. He also saw a rheumatologist at [facility] and according to the patient he did 8 vials of blood. Finally he has scaling rash on his left [name] and what looks to be at [facility] area on the left anterior leg 05/06/17; most of the small punched out areas on the right leg posteriorly and anteriorly are closed. He still has one small one anteriorly one over the right lateral malleolus and one over the right lateral [name]. LAURENS, MATHENY ([id number]) He had his arterial studies they didn't seem to do waveform analysis not exactly sure why however in any case is ABIs were noncompressible bilaterally. They did provide TBIs although looking at the pressures it appears that his TBIs are quite normal. I therefore went ahead and debrided the area over the right lateral malleolus and the right lateral [name] 05/13/17; most of the small punched out areas on the right leg posteriorly and anteriorly are closed. He continues to have problematic areas over the right lateral malleolus and the right lateral [name]. He still requiring aggressive debridement of these 2 wounds using silver collagen I reviewed the note from rheumatology I don't think they came up with a specific diagnosis although he is known to have seropositive RA. They did a panel of lab work when I was able to see his his AMA was negative, anti-smooth muscle antibodies negative antineutrophil cytoplasmic antibodies negative,Liv/kia type 1 negative. Serum C3 and C4 were negative. I don't see his muscle enzymes specifically. He was referred back to his local rheumatologist for management of his  known rheumatoid arthritis.the patient states he still feels weak and fatigued. He states he has numbness in both feet and apparently is known to have neuropathy 05/20/17; the patient continues to have a difficult problem on the right lateral malleolus and the right lateral [name]. Both of these wounds have no viable surface even with attempts at debridement. He has a new wound on the left plantar metatarsal head which looks more like a superficial diabetic pressure related injury then part of this underlying issue he has. Most of the rest of the wounds on his legs look satisfactory. Mostly on the right calf.reviewed his arterial studies which showed noncompressible vessels bilaterally but the TBIs were quite normal. 05/27/17; no real improvement in the right lateral malleolus and right lateral [name]. In fact the right lateral [name] is now on bone. I gave him doxycycline empirically last week it appears that he developed photosensitivity was 63 the son in a tractor. I'll not give him any more of this. This is predominantly on his face and dorsal forearms and hands. I given this more as an anti- inflammatory. I'm going to have him seen by vascular surgery. His TBIs that he had done previously ordered by Dr. Brigitte Pulse were in the normal range They never did full arterial studies on him. he now has small punched out wounds on the right lateral ankle and right lateral [name]. I doubt these are ischemic however I would like a review of his macrovascular status. He does describe some pain at night. I'll reduce the compression from 3 layer to 2 layer and I'm not convinced that this is a macrovascular issue however I want to make sure. We've been using silver alginate 06/03/17; the patient's x-rays that I ordered last time of his right ankle and right lateral [name] did not show definite osteomyelitis. We've been using silver alginate. The  wounds are not making any progress. The area on the plantar left first metatarsal  head however appears to be better. The patient complains of weakness that is more of the fatigue. He says if he's walking his head will fall onto his chest and that his legs literally gave out on him. He did see neurology in the past however that was at a time where his workup was for temporal arteritis and headaches. 06/10/17; the patient is making no progress with the areas on the right lateral [name] and right lateral malleolus. In fact the area on the [name] probes to bone. X-ray did not show osteomyelitis. He went and saw vein and vascular on 06/04/17 he was felt to have significant reflux in the left greater saphenous vein over this is not in the area we are most concerned about. He could be offered ablation. He was not felt to have venous reflux noted in the right lower extremity. He was felt to have some degree of lymphedema and he was felt to be a candidate for compression pumps. Finally a diagnostic arteriogram was suggested which is really what I'm most interested in. The patient has wife wanted time to think about this, I think they were confused about interacting between venous and arterial discussions. I think it would be probably well worth going through the angiogram. Culture I did have the deeper area on the right lateral [name] showed a few Enterococcus faecalis. I would like to start her on amoxicillin which they will start today for one-week 500 3 times a day 06/17/17; the patient still has punched out areas on the right lateral [name] and right lateral malleolus. There is not a viable surface here. We have been using Santyl. He also has an area on the plantar aspect of his left first metatarsal head this also seems to be better 06/24/17; patient's angiogram as on 07/06/17; still has punched out areas on the right lateral [name] and right lateral malleolus to which we've been applying Santyl-based dressings. He also has a more superficial area on the left plantar first metatarsal head, using  collagen here Electronic Signature(s) Signed: 06/24/2017 4:07:49 PM By: Linton Ham MD Entered By: Linton Ham on 06/24/2017 09:39:32 [name] ([id number]) -------------------------------------------------------------------------------- Physical Exam Details Patient Name: [name] Date of Service: 06/24/2017 8:30 AM Medical Record Number: [id number] Patient Account Number: [id number] Date of Birth/Sex: [birth year] (77 y.o. M) Treating RN: Cornell Barman Primary Care Provider: Cyndi Bender Other Clinician: Referring Provider: Cyndi Bender Treating Provider/Extender: Tito Dine in Treatment: 11 Notes wound exam; multiple areas on the posterior and anterior part of his right calf remaining closed oPupils on the right lateral malleolus and right lateral [name] remains open. Considerable amount of adherent necrotic debris in surface slough over the wound beds on both sides here. Using #3 curet a gentle debridement of these areas. I was able to get down to something that looks like reasonable granulation. Hemostasis with direct pressure. The area on the left lateral [name] is still deep and approaches periosteum oSmall area on the plantar left first metatarsal head again looks superficial. No debridement required Electronic Signature(s) Signed: 06/24/2017 4:07:49 PM By: Linton Ham MD Entered By: Linton Ham on 06/24/2017 09:40:49 [name] ([id number]) -------------------------------------------------------------------------------- Physician Orders Details Patient Name: [name] Date of Service: 06/24/2017 8:30 AM Medical Record Number: [id number] Patient Account Number: [id number] Date of Birth/Sex: 1940/07/21 (77 y.o. M) Treating RN: Cornell Barman Primary Care Provider: Cyndi Bender Other Clinician: Referring  Provider: Cyndi Bender Treating Provider/Extender: Tito Dine in Treatment: 31 Verbal / Phone Orders:  No Diagnosis Coding Wound Cleansing Wound #6 Right,Lateral Malleolus o Clean wound with Normal Saline. Wound #8 Right,Lateral [name] o Clean wound with Normal Saline. Wound #9 Left Metatarsal head first o Clean wound with Normal Saline. Anesthetic (add to Medication List) Wound #6 Right,Lateral Malleolus o Topical Lidocaine 4% cream applied to wound bed prior to debridement (In Clinic Only). Wound #8 Right,Lateral [name] o Topical Lidocaine 4% cream applied to wound bed prior to debridement (In Clinic Only). Wound #9 Left Metatarsal head first o Topical Lidocaine 4% cream applied to wound bed prior to debridement (In Clinic Only). Primary Wound Dressing Wound #6 Right,Lateral Malleolus o Santyl Ointment Wound #8 Right,Lateral [name] o Santyl Ointment Wound #9 Left Metatarsal head first o Silver Collagen Secondary Dressing Wound #6 Right,Lateral Malleolus o ABD pad Wound #8 Right,Lateral [name] o ABD pad Wound #9 Left Metatarsal head first o ABD pad Dressing Change Frequency Wound #6 Right,Lateral Malleolus o Change Dressing Monday, Wednesday, Friday WLADYSLAW, HENRICHS ([id number]) Wound #8 Right,Lateral [name] o Change Dressing Monday, Wednesday, Friday Wound #9 Left Metatarsal head first o Change Dressing Monday, Wednesday, Friday Follow-up Appointments Wound #6 Right,Lateral Malleolus o Return Appointment in 2 weeks. Wound #8 Right,Lateral [name] o Return Appointment in 2 weeks. Wound #9 Left Metatarsal head first o Return Appointment in 2 weeks. Edema Control Wound #6 Right,Lateral Malleolus o Kerlix and Coban - Right Lower Extremity o Elevate legs to the level of the heart and pump ankles as often as possible Wound #8 Right,Lateral [name] o Kerlix and Coban - Right Lower Extremity o Elevate legs to the level of the heart and pump ankles as often as possible Wound #9 Left Metatarsal head first o Kerlix and Coban - Right Lower  Extremity o Elevate legs to the level of the heart and pump ankles as often as possible Home Health Wound #6 [place] Nurse may visit PRN to address patientos wound care needs. o FACE TO FACE ENCOUNTER: MEDICARE and MEDICAID PATIENTS: I certify that this patient is under my care and that I had a face-to-face encounter that meets the physician face-to-face encounter requirements with this patient on this date. The encounter with the patient was in whole or in part for the following MEDICAL CONDITION: (primary reason for [place]) MEDICAL NECESSITY: I certify, that based on my findings, NURSING services are a medically necessary home health service. HOME BOUND STATUS: I certify that my clinical findings support that this patient is homebound (i.e., Due to illness or injury, pt requires aid of supportive devices such as crutches, cane, wheelchairs, walkers, the use of special transportation or the assistance of another person to leave their place of residence. There is a normal inability to leave the home and doing so requires considerable and taxing effort. Other absences are for medical reasons / religious services and are infrequent or of short duration when for other reasons). o If current dressing causes regression in wound condition, may D/C ordered dressing product/s and apply Normal Saline Moist Dressing daily until next [place] / Other MD appointment. [place] of regression in wound condition at [phone]. o Please direct any NON-WOUND related issues/requests for orders to patient's Primary Care Physician Wound #8 [place] Nurse may visit PRN to address patientos wound care needs. o FACE TO FACE  ENCOUNTER: MEDICARE and MEDICAID PATIENTS: I certify that this patient is under my care and that I had a face-to-face  encounter that meets the physician face-to-face encounter requirements with this patient on this date. The encounter with the patient was in whole or in part for the following MEDICAL CONDITION: (primary reason for [place]) MEDICAL NECESSITY: I certify, that based on my findings, YOVAN, LEEMAN ([id number]) NURSING services are a medically necessary home health service. HOME BOUND STATUS: I certify that my clinical findings support that this patient is homebound (i.e., Due to illness or injury, pt requires aid of supportive devices such as crutches, cane, wheelchairs, walkers, the use of special transportation or the assistance of another person to leave their place of residence. There is a normal inability to leave the home and doing so requires considerable and taxing effort. Other absences are for medical reasons / religious services and are infrequent or of short duration when for other reasons). o If current dressing causes regression in wound condition, may D/C ordered dressing product/s and apply Normal Saline Moist Dressing daily until next [place] / Other MD appointment. [place] of regression in wound condition at [phone]. o Please direct any NON-WOUND related issues/requests for orders to patient's Primary Care Physician Wound #9 Left Metatarsal head first o [place] Nurse may visit PRN to address patientos wound care needs. o FACE TO FACE ENCOUNTER: MEDICARE and MEDICAID PATIENTS: I certify that this patient is under my care and that I had a face-to-face encounter that meets the physician face-to-face encounter requirements with this patient on this date. The encounter with the patient was in whole or in part for the following MEDICAL CONDITION: (primary reason for [place]) MEDICAL NECESSITY: I certify, that based on my findings, NURSING services are a medically necessary home health  service. HOME BOUND STATUS: I certify that my clinical findings support that this patient is homebound (i.e., Due to illness or injury, pt requires aid of supportive devices such as crutches, cane, wheelchairs, walkers, the use of special transportation or the assistance of another person to leave their place of residence. There is a normal inability to leave the home and doing so requires considerable and taxing effort. Other absences are for medical reasons / religious services and are infrequent or of short duration when for other reasons). o If current dressing causes regression in wound condition, may D/C ordered dressing product/s and apply Normal Saline Moist Dressing daily until next [place] / Other MD appointment. [place] of regression in wound condition at [phone]. o Please direct any NON-WOUND related issues/requests for orders to patient's Primary Care Physician Electronic Signature(s) Signed: 06/24/2017 4:07:49 PM By: Linton Ham MD Signed: 06/24/2017 4:19:21 PM By: Gretta Cool, BSN, RN, CWS, Kim RN, BSN Entered By: Gretta Cool, BSN, RN, CWS, Kim on 06/24/2017 09:19:25 [name] ([id number]) -------------------------------------------------------------------------------- Problem List Details Patient Name: [name] Date of Service: 06/24/2017 8:30 AM Medical Record Number: [id number] Patient Account Number: [id number] Date of Birth/Sex: [birth year] (77 y.o. 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 limited to 04/08/2017 No Yes breakdown of skin L97.211 Non-pressure chronic ulcer of right calf limited to breakdown 04/08/2017 No  Yes of skin  L97.511 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 limited to 04/08/2017 No Yes breakdown of skin I25.5 Ischemic cardiomyopathy 04/08/2017 No Yes Inactive Problems Resolved Problems Electronic Signature(s) Signed: 06/24/2017 4:07:49 PM By: Linton Ham MD Entered By: Linton Ham on 06/24/2017 09:37:28 [name] ([id number]) -------------------------------------------------------------------------------- Progress Note Details Patient Name: [name] Date of Service: 06/24/2017 8:30 AM Medical Record Number: [id number] Patient Account Number: [id number] Date of Birth/Sex: [birth year] (77 y.o. M) Treating RN: Cornell Barman Primary Care Provider: Cyndi Bender Other Clinician: Referring Provider: Cyndi Bender Treating Provider/Extender: Tito Dine in Treatment: 11 Subjective History of Present Illness (HPI) 04/08/17; this is a complex 77 year old man referred here from [facility]. He had been referred there for bilateral lower extremity edema with ulcer formation predominantly on the right calf but also the right [name]. He had been receiving Unna boots bilaterally. The history here is long. He is not a diabetic however ICU looking through late 2018 he was worked up for chronic headaches, elevated inflammatory markers including C-reactive protein and ESR. He went on to actually have a left temporal artery biopsy wasn't that was negative.he received about 6 weeks of high-dose prednisone 60 mg with improvement in his inflammatory markers. He was admitted to hospital in late November with ventricular tachycardia syncope. He has known ischemic cardiomyopathy. He was admitted in the hospital in mid December. Apparently this was precipitated by a syncopal spell falling out of his scooter while at [place]. There was ventricular arrhythmia. He has an implantable defibrillator and echocardiogram showed severe LV dysfunction with an  EF of 20% and valvular regurgitations including mild AR, moderate MR. There was no stenosis. He ruled in for a non-ST elevation MI in the setting of V. tach.his wife states that sometime during this hospitalization she noted multiple areas of skin change on the right lower calf which became evident just after he left the hospital. He was back in hospital in February with acute renal failure hyponatremia. This responded to fluid resuscitation.. Interestingly I can't see much description of his right leg at that point in time.he was followed by Dr. Nehemiah Massed of dermatology for the necrotic wounds on his right leg. Apparently a biopsy was planned at one point but not done although in some notes that suggests it was. I cannot see these results area He was noted to have a lot of edema. Was treated with bilateral Unna boots edges really helped with the swelling they have been using Bactroban to small open areas predominantly on the right anterior lower leg His history is complicated by the fact that he has rheumatoid arthritis followed by rheumatology. He is followed by neurology for disabling headaches. At one point this was felt to be giant cell arteritis although a left temporal biopsy was apparently negative. He was given a prolonged course of prednisone at 60 mg which managed his sedimentation rates but apparently did not prove improve the headaches. This is been tapered to off on by rheumatology on 03/13/17 Vascular had plans to do a venous reflux workup as well as arterial studies in May. They also wanted to get him a lymphedema pump. As mentioned he's been using bacitracin under Unna boot wraps to both lower legs 04/15/17; the patient arrives with most of his wounds improved. These are small punched out wounds. Most of them remaining ones are on the right anterior calf with the most problematic over the right lateral malleolus. There are no new areas. The symptom complex  or potential symptom complex  we are dealing with his chronic disabling headaches with inflammatory markers not responsive to prednisone and with a negative temporal biopsy, lower extremity weakness, skin ulcerations just on the right leg. We have managed to get his arterial studies moved up to April 30. He has a rheumatology consult at [facility] in June. He has seen dermatology locally, rheumatology locally, neurology locally. 04/22/17; small punched out areas on the right leg anteriorly posteriorly. Most of these appear to have closed over. Some of them have eschar over the surface. The most problematic area appears to be over the right lateral malleolus. We've been using prisma to all of this. He has arterial studies on April 30 and a rheumatology consult at [facility] on June 28 04/29/17; most of the small punched out areas on the right leg posteriorly are closed. He has 2 or 3 openings anteriorly but most of these appear to be on the way to closing. Still problematically over the right lateral malleolus and right lateral [name] with almost ischemic-looking eschar. His arterial studies that I ordered are due to be done next week on the 30th so we should have them available for our next visit hopefully. He also saw a rheumatologist at [facility] and according to the patient he did 8 vials of blood. Finally he has scaling rash on his left [name] and what looks to be at [facility] area on the left anterior leg 05/06/17; most of the small punched out areas on the right leg posteriorly and anteriorly are closed. He still has one small one Cenci, Akshay E. ([id number]) anteriorly one over the right lateral malleolus and one over the right lateral [name]. He had his arterial studies they didn't seem to do waveform analysis not exactly sure why however in any case is ABIs were noncompressible bilaterally. They did provide TBIs although looking at the pressures it appears that his TBIs are quite normal. I therefore went ahead and debrided the area over the  right lateral malleolus and the right lateral [name] 05/13/17; most of the small punched out areas on the right leg posteriorly and anteriorly are closed. He continues to have problematic areas over the right lateral malleolus and the right lateral [name]. He still requiring aggressive debridement of these 2 wounds using silver collagen I reviewed the note from rheumatology I don't think they came up with a specific diagnosis although he is known to have seropositive RA. They did a panel of lab work when I was able to see his his AMA was negative, anti-smooth muscle antibodies negative antineutrophil cytoplasmic antibodies negative,Liv/kia type 1 negative. Serum C3 and C4 were negative. I don't see his muscle enzymes specifically. He was referred back to his local rheumatologist for management of his known rheumatoid arthritis.the patient states he still feels weak and fatigued. He states he has numbness in both feet and apparently is known to have neuropathy 05/20/17; the patient continues to have a difficult problem on the right lateral malleolus and the right lateral [name]. Both of these wounds have no viable surface even with attempts at debridement. He has a new wound on the left plantar metatarsal head which looks more like a superficial diabetic pressure related injury then part of this underlying issue he has. Most of the rest of the wounds on his legs look satisfactory. Mostly on the right calf.reviewed his arterial studies which showed noncompressible vessels bilaterally but the TBIs were quite normal. 05/27/17; no real improvement in the right lateral malleolus and right  lateral [name]. In fact the right lateral [name] is now on bone. I gave him doxycycline empirically last week it appears that he developed photosensitivity was 73 the son in a tractor. I'll not give him any more of this. This is predominantly on his face and dorsal forearms and hands. I given this more as an anti- inflammatory. I'm  going to have him seen by vascular surgery. His TBIs that he had done previously ordered by Dr. Brigitte Pulse were in the normal range They never did full arterial studies on him. he now has small punched out wounds on the right lateral ankle and right lateral [name]. I doubt these are ischemic however I would like a review of his macrovascular status. He does describe some pain at night. I'll reduce the compression from 3 layer to 2 layer and I'm not convinced that this is a macrovascular issue however I want to make sure. We've been using silver alginate 06/03/17; the patient's x-rays that I ordered last time of his right ankle and right lateral [name] did not show definite osteomyelitis. We've been using silver alginate. The wounds are not making any progress. The area on the plantar left first metatarsal head however appears to be better. The patient complains of weakness that is more of the fatigue. He says if he's walking his head will fall onto his chest and that his legs literally gave out on him. He did see neurology in the past however that was at a time where his workup was for temporal arteritis and headaches. 06/10/17; the patient is making no progress with the areas on the right lateral [name] and right lateral malleolus. In fact the area on the [name] probes to bone. X-ray did not show osteomyelitis. He went and saw vein and vascular on 06/04/17 he was felt to have significant reflux in the left greater saphenous vein over this is not in the area we are most concerned about. He could be offered ablation. He was not felt to have venous reflux noted in the right lower extremity. He was felt to have some degree of lymphedema and he was felt to be a candidate for compression pumps. Finally a diagnostic arteriogram was suggested which is really what I'm most interested in. The patient has wife wanted time to think about this, I think they were confused about interacting between venous and arterial discussions.  I think it would be probably well worth going through the angiogram. Culture I did have the deeper area on the right lateral [name] showed a few Enterococcus faecalis. I would like to start her on amoxicillin which they will start today for one-week 500 3 times a day 06/17/17; the patient still has punched out areas on the right lateral [name] and right lateral malleolus. There is not a viable surface here. We have been using Santyl. He also has an area on the plantar aspect of his left first metatarsal head this also seems to be better 06/24/17; patient's angiogram as on 07/06/17; still has punched out areas on the right lateral [name] and right lateral malleolus to which we've been applying Santyl-based dressings. He also has a more superficial area on the left plantar first metatarsal head, using collagen here Jarema, Maxxwell E. ([id number]) Objective Constitutional Vitals Time Taken: 8:40 AM, Height: 68 in, Temperature: 97.6 F, Pulse: 63 bpm, Respiratory Rate: 18 breaths/min, Blood Pressure: 99/55 mmHg. Integumentary (Hair, Skin) Wound #6 status is Open. Original cause of wound was Gradually Appeared. The wound is located on the  Right,Lateral Malleolus. The wound measures 0.7cm length x 0.4cm width x 0.5cm depth; 0.22cm^2 area and 0.11cm^3 volume. There is no tunneling noted, however, there is undermining starting at 12:00 and ending at 6:00 with a maximum distance of 0.3cm. There is a large amount of serous drainage noted. The wound margin is distinct with the outline attached to the wound base. There is medium (34-66%) red, pink granulation within the wound bed. There is a medium (34-66%) amount of necrotic tissue within the wound bed including Adherent Slough. The periwound skin appearance exhibited: Callus. The periwound skin appearance did not exhibit: Crepitus, Excoriation, Induration, Rash, Scarring, Dry/Scaly, Maceration, Atrophie Blanche, Cyanosis, Ecchymosis, Hemosiderin Staining, Mottled,  Pallor, Rubor, Erythema. Periwound temperature was noted as No Abnormality. The periwound has tenderness on palpation. Wound #8 status is Open. Original cause of wound was Gradually Appeared. The wound is located on the Right,Lateral [name]. The wound measures 0.7cm length x 1cm width x 0.2cm depth; 0.55cm^2 area and 0.11cm^3 volume. There is bone and Fat Layer (Subcutaneous Tissue) Exposed exposed. There is undermining starting at 11:00 and ending at 5:00 with a maximum distance of 0.3cm. There is a large amount of serous drainage noted. The wound margin is distinct with the outline attached to the wound base. There is no granulation within the wound bed. There is a large (67-100%) amount of necrotic tissue within the wound bed including Adherent Slough. The periwound skin appearance exhibited: Callus, Maceration. Periwound temperature was noted as No Abnormality. The periwound has tenderness on palpation. Wound #9 status is Open. Original cause of wound was Gradually Appeared. The wound is located on the Left Metatarsal head first. The wound measures 0.5cm length x 0.5cm width x 0.1cm depth; 0.196cm^2 area and 0.02cm^3 volume. There is Fat Layer (Subcutaneous Tissue) Exposed exposed. There is no tunneling or undermining noted. There is a large amount of serous drainage noted. The wound margin is flat and intact. There is no granulation within the wound bed. There is a large (67-100%) amount of necrotic tissue within the wound bed including Adherent Slough. The periwound skin appearance exhibited: Maceration. The periwound skin appearance did not exhibit: Callus, Crepitus, Excoriation, Induration, Rash, Scarring, Dry/Scaly, Atrophie Blanche, Cyanosis, Ecchymosis, Hemosiderin Staining, Mottled, Pallor, Rubor, Erythema. Periwound temperature was noted as No Abnormality. 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 ([id number]) Tissue Pre Debridement is: Fat layer exposed. There was a Excisional Skin/Subcutaneous Tissue Debridement with a total area of 0.28 sq cm performed by Ricard Dillon, MD. With the following instrument(s): Curette to remove Viable and Non-Viable tissue/material. Material removed includes Subcutaneous Tissue and Slough and after achieving pain control using Other (lidocaine 4%). No specimens were taken. A time out was conducted at 09:12, prior to the start of the procedure. A Minimum amount of bleeding was controlled with Pressure. The procedure was tolerated well with a pain level of 2 throughout and a pain level of 2 following the procedure. Patient s Level of Consciousness post procedure was recorded as Awake and Alert. Post Debridement Measurements: 0.7cm length x 0.4cm width x 0.5cm depth; 0.11cm^3 volume. Character of Wound/Ulcer Post Debridement is stable. Severity of Tissue  Post Debridement is: Fat layer exposed. Post procedure Diagnosis Wound #6: Same as Pre-Procedure Wound #8 Pre-procedure diagnosis of Wound #8 is an Arterial Insufficiency Ulcer located on the Right,Lateral [name] .Severity of Tissue Pre Debridement is: Fat layer exposed. There was a Excisional Skin/Subcutaneous Tissue Debridement with a total area of 0.7 sq cm performed by Ricard Dillon, MD. With the following instrument(s): Curette to remove Viable and Non- Viable tissue/material. Material removed includes Subcutaneous Tissue and Slough and after achieving pain control using Other (lidocaine 4%). No specimens were taken. A time out was conducted at 09:12, prior to the start of the  procedure. A Minimum amount of bleeding was controlled with Pressure. The procedure was tolerated well with a pain level of 2 throughout and a pain level of 2 following the procedure. Patient s Level of Consciousness post procedure was recorded as Awake and Alert. Post Debridement Measurements: 0.7cm length x 1cm width x 0.3cm depth; 0.165cm^3 volume. Character of Wound/Ulcer Post Debridement is stable. Severity of Tissue Post Debridement is: Fat layer exposed. Post procedure Diagnosis Wound #8: Same as Pre-Procedure Plan Wound Cleansing: Wound #6 Right,Lateral Malleolus: Clean wound with Normal Saline. Wound #8 Right,Lateral [name]: Clean wound with Normal Saline. Wound #9 Left Metatarsal head first: Clean wound with Normal Saline. Anesthetic (add to Medication List): Wound #6 Right,Lateral Malleolus: Topical Lidocaine 4% cream applied to wound bed prior to debridement (In Clinic Only). Wound #8 Right,Lateral [name]: Topical Lidocaine 4% cream applied to wound bed prior to debridement (In Clinic Only). Wound #9 Left Metatarsal head first: Topical Lidocaine 4% cream applied to wound bed prior to debridement (In Clinic Only). Primary Wound Dressing: Wound #6 Right,Lateral Malleolus: Santyl Ointment Wound #8 Right,Lateral [name]: Santyl Ointment Wound #9 Left Metatarsal head first: Silver Collagen Secondary Dressing: Wound #6 Right,Lateral Malleolus: ABD pad Wound #8 Right,Lateral [name]: ABD pad Wound #9 Left Metatarsal head first: ABD pad Dressing Change Frequency: NASSIM, COSMA ([id number]) Wound #6 Right,Lateral Malleolus: Change Dressing Monday, Wednesday, Friday Wound #8 Right,Lateral [name]: Change Dressing Monday, Wednesday, Friday Wound #9 Left Metatarsal head first: Change Dressing Monday, Wednesday, Friday Follow-up Appointments: Wound #6 Right,Lateral Malleolus: Return Appointment in 2 weeks. Wound #8 Right,Lateral [name]: Return Appointment in 2 weeks. Wound #9 Left  Metatarsal head first: Return Appointment in 2 weeks. Edema Control: Wound #6 Right,Lateral Malleolus: Kerlix and Coban - Right Lower Extremity Elevate legs to the level of the heart and pump ankles as often as possible Wound #8 Right,Lateral [name]: Kerlix and Coban - Right Lower Extremity Elevate legs to the level of the heart and pump ankles as often as possible Wound #9 Left Metatarsal head first: Kerlix and Coban - Right Lower Extremity Elevate legs to the level of the heart and pump ankles as often as possible Home Health: Wound #6 Right,Lateral Malleolus: [place] Nurse may visit PRN to address patient s wound care needs. FACE TO FACE ENCOUNTER: MEDICARE and MEDICAID PATIENTS: I certify that this patient is under my care and that I had a face-to-face encounter that meets the physician face-to-face encounter requirements with this patient on this date. The encounter with the patient was in whole or in part for the following MEDICAL CONDITION: (primary reason for [place]) MEDICAL NECESSITY: I certify, that based on my findings, NURSING services are a medically necessary home health service. HOME BOUND STATUS: I certify that my clinical findings support that this patient is homebound (i.e., Due to illness or injury, pt requires aid of  supportive devices such as crutches, cane, wheelchairs, walkers, the use of special transportation or the assistance of another person to leave their place of residence. There is a normal inability to leave the home and doing so requires considerable and taxing effort. Other absences are for medical reasons / religious services and are infrequent or of short duration when for other reasons). If current dressing causes regression in wound condition, may D/C ordered dressing product/s and apply Normal Saline Moist Dressing daily until next [place] / Other MD appointment. [place] of  regression in wound condition at [phone]. Please direct any NON-WOUND related issues/requests for orders to patient's Primary Care Physician Wound #8 Right,Lateral [name]: [place] Nurse may visit PRN to address patient s wound care needs. FACE TO FACE ENCOUNTER: MEDICARE and MEDICAID PATIENTS: I certify that this patient is under my care and that I had a face-to-face encounter that meets the physician face-to-face encounter requirements with this patient on this date. The encounter with the patient was in whole or in part for the following MEDICAL CONDITION: (primary reason for [place]) MEDICAL NECESSITY: I certify, that based on my findings, NURSING services are a medically necessary home health service. HOME BOUND STATUS: I certify that my clinical findings support that this patient is homebound (i.e., Due to illness or injury, pt requires aid of supportive devices such as crutches, cane, wheelchairs, walkers, the use of special transportation or the assistance of another person to leave their place of residence. There is a normal inability to leave the home and doing so requires considerable and taxing effort. Other absences are for medical reasons / religious services and are infrequent or of short duration when for other reasons). If current dressing causes regression in wound condition, may D/C ordered dressing product/s and apply Normal Saline Moist Dressing daily until next [place] / Other MD appointment. [place] of regression in wound condition at [phone]. Please direct any NON-WOUND related issues/requests for orders to patient's Primary Care Physician Wound #9 Left Metatarsal head first: [place] Nurse may visit PRN to address patient s wound care needs. FACE TO FACE ENCOUNTER: MEDICARE and MEDICAID PATIENTS: I certify that this patient is under my care and that  I Schlag, Mitchelle E. ([id number]) had a face-to-face encounter that meets the physician face-to-face encounter requirements with this patient on this date. The encounter with the patient was in whole or in part for the following MEDICAL CONDITION: (primary reason for [place]) MEDICAL NECESSITY: I certify, that based on my findings, NURSING services are a medically necessary home health service. HOME BOUND STATUS: I certify that my clinical findings support that this patient is homebound (i.e., Due to illness or injury, pt requires aid of supportive devices such as crutches, cane, wheelchairs, walkers, the use of special transportation or the assistance of another person to leave their place of residence. There is a normal inability to leave the home and doing so requires considerable and taxing effort. Other absences are for medical reasons / religious services and are infrequent or of short duration when for other reasons). If current dressing causes regression in wound condition, may D/C ordered dressing product/s and apply Normal Saline Moist Dressing daily until next [place] / Other MD appointment. [place] of regression in wound condition at [phone]. Please direct any NON-WOUND related issues/requests for orders to patient's Primary Care Physician #1 I'm continuing  with Santyl to both areas on the right [name] #2 continue with collagen to the plantar left first metatarsal head, will need to check off loading on him next time I have really not been focused on this #3 the patient will have his angiogram on 7/1 to make sure he doesn't have concomitant macrovascular disease in the right leg complicating healing of the 2 problematic wounds Electronic Signature(s) Signed: 06/24/2017 4:07:49 PM By: Linton Ham MD Entered By: Linton Ham on 06/24/2017 09:42:09 [name]  ([id number]) -------------------------------------------------------------------------------- SuperBill Details Patient Name: [name] Date of Service: 06/24/2017 Medical Record Number: [id number] Patient Account Number: [id number] Date of Birth/Sex: 12-17-1940 (77 y.o. 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 I25.5 Ischemic cardiomyopathy Facility Procedures CPT4 Code Description: [id number] 11042 - DEB SUBQ TISSUE 20 SQ CM/< ICD-10 Diagnosis Description L97.211 Non-pressure chronic ulcer of right calf limited to breakdown o L97.511 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 limited Modifier: f skin to breakdown of Quantity: 1 skin Physician Procedures CPT4 Code Description: [id number] - WC PHYS SUBQ TISS 20 SQ CM ICD-10 Diagnosis Description L97.211 Non-pressure chronic ulcer of right calf limited to breakdown o L97.511 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 limited Modifier: f skin to breakdown of Quantity: 1 skin Electronic Signature(s) Signed: 06/24/2017 4:07:49 PM By: Linton Ham MD Entered By: Linton Ham on 06/24/2017 09:42:34

## 2017-06-29 ENCOUNTER — Other Ambulatory Visit (INDEPENDENT_AMBULATORY_CARE_PROVIDER_SITE_OTHER): Payer: Self-pay | Admitting: Vascular Surgery

## 2017-06-29 DIAGNOSIS — M069 Rheumatoid arthritis, unspecified: Secondary | ICD-10-CM | POA: Diagnosis not present

## 2017-06-29 DIAGNOSIS — I5023 Acute on chronic systolic (congestive) heart failure: Secondary | ICD-10-CM | POA: Diagnosis not present

## 2017-06-29 DIAGNOSIS — I110 Hypertensive heart disease with heart failure: Secondary | ICD-10-CM | POA: Diagnosis not present

## 2017-06-29 DIAGNOSIS — I2510 Atherosclerotic heart disease of native coronary artery without angina pectoris: Secondary | ICD-10-CM | POA: Diagnosis not present

## 2017-06-29 DIAGNOSIS — I255 Ischemic cardiomyopathy: Secondary | ICD-10-CM | POA: Diagnosis not present

## 2017-06-29 DIAGNOSIS — S81811D Laceration without foreign body, right lower leg, subsequent encounter: Secondary | ICD-10-CM | POA: Diagnosis not present

## 2017-07-01 DIAGNOSIS — I2510 Atherosclerotic heart disease of native coronary artery without angina pectoris: Secondary | ICD-10-CM | POA: Diagnosis not present

## 2017-07-01 DIAGNOSIS — I5023 Acute on chronic systolic (congestive) heart failure: Secondary | ICD-10-CM | POA: Diagnosis not present

## 2017-07-01 DIAGNOSIS — I110 Hypertensive heart disease with heart failure: Secondary | ICD-10-CM | POA: Diagnosis not present

## 2017-07-01 DIAGNOSIS — M069 Rheumatoid arthritis, unspecified: Secondary | ICD-10-CM | POA: Diagnosis not present

## 2017-07-01 DIAGNOSIS — S81811D Laceration without foreign body, right lower leg, subsequent encounter: Secondary | ICD-10-CM | POA: Diagnosis not present

## 2017-07-01 DIAGNOSIS — I255 Ischemic cardiomyopathy: Secondary | ICD-10-CM | POA: Diagnosis not present

## 2017-07-03 ENCOUNTER — Encounter
Admission: RE | Admit: 2017-07-03 | Discharge: 2017-07-03 | Disposition: A | Discharge status: Home / Self Care | Payer: Medicare Other | Source: Ambulatory Visit | Attending: Vascular Surgery | Admitting: Vascular Surgery

## 2017-07-03 DIAGNOSIS — S81811D Laceration without foreign body, right lower leg, subsequent encounter: Secondary | ICD-10-CM | POA: Diagnosis not present

## 2017-07-03 DIAGNOSIS — I255 Ischemic cardiomyopathy: Secondary | ICD-10-CM | POA: Diagnosis not present

## 2017-07-03 DIAGNOSIS — M1990 Unspecified osteoarthritis, unspecified site: Secondary | ICD-10-CM | POA: Diagnosis not present

## 2017-07-03 DIAGNOSIS — E039 Hypothyroidism, unspecified: Secondary | ICD-10-CM | POA: Diagnosis not present

## 2017-07-03 DIAGNOSIS — G4730 Sleep apnea, unspecified: Secondary | ICD-10-CM | POA: Diagnosis not present

## 2017-07-03 DIAGNOSIS — I5023 Acute on chronic systolic (congestive) heart failure: Secondary | ICD-10-CM | POA: Diagnosis not present

## 2017-07-03 DIAGNOSIS — Z882 Allergy status to sulfonamides status: Secondary | ICD-10-CM | POA: Diagnosis not present

## 2017-07-03 DIAGNOSIS — Z820 Family history of epilepsy and other diseases of the nervous system: Secondary | ICD-10-CM | POA: Diagnosis not present

## 2017-07-03 DIAGNOSIS — I110 Hypertensive heart disease with heart failure: Secondary | ICD-10-CM | POA: Diagnosis not present

## 2017-07-03 DIAGNOSIS — Z9842 Cataract extraction status, left eye: Secondary | ICD-10-CM | POA: Diagnosis not present

## 2017-07-03 DIAGNOSIS — I252 Old myocardial infarction: Secondary | ICD-10-CM | POA: Diagnosis not present

## 2017-07-03 DIAGNOSIS — E7800 Pure hypercholesterolemia, unspecified: Secondary | ICD-10-CM | POA: Diagnosis not present

## 2017-07-03 DIAGNOSIS — Z951 Presence of aortocoronary bypass graft: Secondary | ICD-10-CM | POA: Diagnosis not present

## 2017-07-03 DIAGNOSIS — Z9889 Other specified postprocedural states: Secondary | ICD-10-CM | POA: Diagnosis not present

## 2017-07-03 DIAGNOSIS — E785 Hyperlipidemia, unspecified: Secondary | ICD-10-CM | POA: Diagnosis not present

## 2017-07-03 DIAGNOSIS — I7025 Atherosclerosis of native arteries of other extremities with ulceration: Secondary | ICD-10-CM | POA: Diagnosis not present

## 2017-07-03 DIAGNOSIS — Z9841 Cataract extraction status, right eye: Secondary | ICD-10-CM | POA: Diagnosis not present

## 2017-07-03 DIAGNOSIS — Z881 Allergy status to other antibiotic agents status: Secondary | ICD-10-CM | POA: Diagnosis not present

## 2017-07-03 DIAGNOSIS — M069 Rheumatoid arthritis, unspecified: Secondary | ICD-10-CM | POA: Diagnosis not present

## 2017-07-03 DIAGNOSIS — L97519 Non-pressure chronic ulcer of other part of right foot with unspecified severity: Secondary | ICD-10-CM | POA: Diagnosis not present

## 2017-07-03 DIAGNOSIS — I509 Heart failure, unspecified: Secondary | ICD-10-CM | POA: Diagnosis not present

## 2017-07-03 DIAGNOSIS — Z888 Allergy status to other drugs, medicaments and biological substances status: Secondary | ICD-10-CM | POA: Diagnosis not present

## 2017-07-03 DIAGNOSIS — H9190 Unspecified hearing loss, unspecified ear: Secondary | ICD-10-CM | POA: Diagnosis not present

## 2017-07-03 DIAGNOSIS — Z95810 Presence of automatic (implantable) cardiac defibrillator: Secondary | ICD-10-CM | POA: Diagnosis not present

## 2017-07-03 DIAGNOSIS — I2510 Atherosclerotic heart disease of native coronary artery without angina pectoris: Secondary | ICD-10-CM | POA: Diagnosis not present

## 2017-07-03 HISTORY — DX: Hypothyroidism, unspecified: E03.9

## 2017-07-03 LAB — CREATININE, SERUM
CREATININE: 1 mg/dL (ref 0.61–1.24)
GFR calc Af Amer: >60 mL/min (ref >60)

## 2017-07-03 LAB — BUN: BUN: 12 mg/dL (ref 8–23)

## 2017-07-04 DIAGNOSIS — I255 Ischemic cardiomyopathy: Secondary | ICD-10-CM | POA: Diagnosis not present

## 2017-07-04 DIAGNOSIS — E039 Hypothyroidism, unspecified: Secondary | ICD-10-CM | POA: Diagnosis not present

## 2017-07-04 DIAGNOSIS — L97511 Non-pressure chronic ulcer of other part of right foot limited to breakdown of skin: Secondary | ICD-10-CM | POA: Diagnosis not present

## 2017-07-04 DIAGNOSIS — L97311 Non-pressure chronic ulcer of right ankle limited to breakdown of skin: Secondary | ICD-10-CM | POA: Diagnosis not present

## 2017-07-04 DIAGNOSIS — G4730 Sleep apnea, unspecified: Secondary | ICD-10-CM | POA: Diagnosis not present

## 2017-07-04 DIAGNOSIS — M545 Low back pain: Secondary | ICD-10-CM | POA: Diagnosis not present

## 2017-07-04 DIAGNOSIS — I5023 Acute on chronic systolic (congestive) heart failure: Secondary | ICD-10-CM | POA: Diagnosis not present

## 2017-07-04 DIAGNOSIS — M069 Rheumatoid arthritis, unspecified: Secondary | ICD-10-CM | POA: Diagnosis not present

## 2017-07-04 DIAGNOSIS — I2510 Atherosclerotic heart disease of native coronary artery without angina pectoris: Secondary | ICD-10-CM | POA: Diagnosis not present

## 2017-07-04 DIAGNOSIS — I110 Hypertensive heart disease with heart failure: Secondary | ICD-10-CM | POA: Diagnosis not present

## 2017-07-06 ENCOUNTER — Encounter
Admission: RE | Disposition: A | Discharge status: Home / Self Care | Payer: Self-pay | Source: Ambulatory Visit | Attending: Vascular Surgery

## 2017-07-06 ENCOUNTER — Ambulatory Visit
Admission: RE | Admit: 2017-07-06 | Discharge: 2017-07-06 | Disposition: A | Discharge status: Home / Self Care | Payer: Medicare Other | Source: Ambulatory Visit | Attending: Vascular Surgery | Admitting: Vascular Surgery

## 2017-07-06 ENCOUNTER — Other Ambulatory Visit (INDEPENDENT_AMBULATORY_CARE_PROVIDER_SITE_OTHER): Payer: Self-pay

## 2017-07-06 DIAGNOSIS — E039 Hypothyroidism, unspecified: Secondary | ICD-10-CM | POA: Insufficient documentation

## 2017-07-06 DIAGNOSIS — I5023 Acute on chronic systolic (congestive) heart failure: Secondary | ICD-10-CM | POA: Diagnosis not present

## 2017-07-06 DIAGNOSIS — Z888 Allergy status to other drugs, medicaments and biological substances status: Secondary | ICD-10-CM | POA: Insufficient documentation

## 2017-07-06 DIAGNOSIS — L97519 Non-pressure chronic ulcer of other part of right foot with unspecified severity: Secondary | ICD-10-CM | POA: Diagnosis not present

## 2017-07-06 DIAGNOSIS — I255 Ischemic cardiomyopathy: Secondary | ICD-10-CM | POA: Diagnosis not present

## 2017-07-06 DIAGNOSIS — Z820 Family history of epilepsy and other diseases of the nervous system: Secondary | ICD-10-CM | POA: Insufficient documentation

## 2017-07-06 DIAGNOSIS — Z9842 Cataract extraction status, left eye: Secondary | ICD-10-CM | POA: Insufficient documentation

## 2017-07-06 DIAGNOSIS — L97511 Non-pressure chronic ulcer of other part of right foot limited to breakdown of skin: Secondary | ICD-10-CM | POA: Diagnosis not present

## 2017-07-06 DIAGNOSIS — L97909 Non-pressure chronic ulcer of unspecified part of unspecified lower leg with unspecified severity: Secondary | ICD-10-CM

## 2017-07-06 DIAGNOSIS — I509 Heart failure, unspecified: Secondary | ICD-10-CM | POA: Insufficient documentation

## 2017-07-06 DIAGNOSIS — I7025 Atherosclerosis of native arteries of other extremities with ulceration: Secondary | ICD-10-CM | POA: Insufficient documentation

## 2017-07-06 DIAGNOSIS — E7800 Pure hypercholesterolemia, unspecified: Secondary | ICD-10-CM | POA: Diagnosis not present

## 2017-07-06 DIAGNOSIS — H9190 Unspecified hearing loss, unspecified ear: Secondary | ICD-10-CM | POA: Insufficient documentation

## 2017-07-06 DIAGNOSIS — Z882 Allergy status to sulfonamides status: Secondary | ICD-10-CM | POA: Insufficient documentation

## 2017-07-06 DIAGNOSIS — I110 Hypertensive heart disease with heart failure: Secondary | ICD-10-CM | POA: Diagnosis not present

## 2017-07-06 DIAGNOSIS — E785 Hyperlipidemia, unspecified: Secondary | ICD-10-CM | POA: Diagnosis not present

## 2017-07-06 DIAGNOSIS — M069 Rheumatoid arthritis, unspecified: Secondary | ICD-10-CM | POA: Insufficient documentation

## 2017-07-06 DIAGNOSIS — I70238 Atherosclerosis of native arteries of right leg with ulceration of other part of lower right leg: Secondary | ICD-10-CM | POA: Diagnosis not present

## 2017-07-06 DIAGNOSIS — I2510 Atherosclerotic heart disease of native coronary artery without angina pectoris: Secondary | ICD-10-CM | POA: Diagnosis not present

## 2017-07-06 DIAGNOSIS — L97311 Non-pressure chronic ulcer of right ankle limited to breakdown of skin: Secondary | ICD-10-CM | POA: Diagnosis not present

## 2017-07-06 DIAGNOSIS — Z951 Presence of aortocoronary bypass graft: Secondary | ICD-10-CM | POA: Insufficient documentation

## 2017-07-06 DIAGNOSIS — I70299 Other atherosclerosis of native arteries of extremities, unspecified extremity: Secondary | ICD-10-CM

## 2017-07-06 DIAGNOSIS — M1990 Unspecified osteoarthritis, unspecified site: Secondary | ICD-10-CM | POA: Insufficient documentation

## 2017-07-06 DIAGNOSIS — Z95810 Presence of automatic (implantable) cardiac defibrillator: Secondary | ICD-10-CM | POA: Insufficient documentation

## 2017-07-06 DIAGNOSIS — M79604 Pain in right leg: Secondary | ICD-10-CM | POA: Diagnosis not present

## 2017-07-06 DIAGNOSIS — Z9841 Cataract extraction status, right eye: Secondary | ICD-10-CM | POA: Insufficient documentation

## 2017-07-06 DIAGNOSIS — G4730 Sleep apnea, unspecified: Secondary | ICD-10-CM | POA: Insufficient documentation

## 2017-07-06 DIAGNOSIS — I252 Old myocardial infarction: Secondary | ICD-10-CM | POA: Insufficient documentation

## 2017-07-06 DIAGNOSIS — Z9889 Other specified postprocedural states: Secondary | ICD-10-CM | POA: Insufficient documentation

## 2017-07-06 DIAGNOSIS — Z881 Allergy status to other antibiotic agents status: Secondary | ICD-10-CM | POA: Insufficient documentation

## 2017-07-06 HISTORY — PX: LOWER EXTREMITY ANGIOGRAPHY: CATH118251

## 2017-07-06 SURGERY — LOWER EXTREMITY ANGIOGRAPHY
Anesthesia: Moderate Sedation | Laterality: Right

## 2017-07-06 MED ORDER — CEFAZOLIN SODIUM-DEXTROSE 2-4 GM/100ML-% IV SOLN
INTRAVENOUS | Status: AC
  Administered 2017-07-06: 2 g via INTRAVENOUS
  Filled 2017-07-06: qty 100

## 2017-07-06 MED ORDER — FENTANYL CITRATE (PF) 100 MCG/2ML IJ SOLN
INTRAMUSCULAR | Status: DC | PRN
  Administered 2017-07-06: 50 ug via INTRAVENOUS

## 2017-07-06 MED ORDER — MIDAZOLAM HCL 5 MG/5ML IJ SOLN
INTRAMUSCULAR | Status: AC
  Filled 2017-07-06: qty 5

## 2017-07-06 MED ORDER — MIDAZOLAM HCL 2 MG/2ML IJ SOLN
INTRAMUSCULAR | Status: DC | PRN
  Administered 2017-07-06: 2 mg via INTRAVENOUS

## 2017-07-06 MED ORDER — CLOPIDOGREL BISULFATE 75 MG PO TABS: 75.0000 mg | ORAL_TABLET | Freq: Every day | ORAL | 11 refills | Status: AC

## 2017-07-06 MED ORDER — IOPAMIDOL (ISOVUE-300) INJECTION 61%
INTRAVENOUS | Status: DC | PRN
  Administered 2017-07-06: 55 mL via INTRA_ARTERIAL

## 2017-07-06 MED ORDER — FAMOTIDINE 20 MG PO TABS: 40.0000 mg | ORAL_TABLET | ORAL | Status: DC | PRN

## 2017-07-06 MED ORDER — LABETALOL HCL 5 MG/ML IV SOLN: 10.0000 mg | INTRAVENOUS | Status: DC | PRN

## 2017-07-06 MED ORDER — SODIUM CHLORIDE 0.9 % IV SOLN
INTRAVENOUS | Status: DC
  Administered 2017-07-06: 08:00:00 via INTRAVENOUS

## 2017-07-06 MED ORDER — HEPARIN (PORCINE) IN NACL 1000-0.9 UT/500ML-% IV SOLN
INTRAVENOUS | Status: AC
  Filled 2017-07-06: qty 1000

## 2017-07-06 MED ORDER — SODIUM CHLORIDE 0.9% FLUSH: 3.0000 mL | Freq: Two times a day (BID) | INTRAVENOUS | Status: DC

## 2017-07-06 MED ORDER — CEFAZOLIN SODIUM-DEXTROSE 2-4 GM/100ML-% IV SOLN
2.0000 g | Freq: Once | INTRAVENOUS | Status: AC
  Administered 2017-07-06: 2 g via INTRAVENOUS

## 2017-07-06 MED ORDER — HEPARIN SODIUM (PORCINE) 1000 UNIT/ML IJ SOLN
INTRAMUSCULAR | Status: DC | PRN
  Administered 2017-07-06: 4000 [IU] via INTRAVENOUS

## 2017-07-06 MED ORDER — METHYLPREDNISOLONE SODIUM SUCC 125 MG IJ SOLR: 125.0000 mg | INTRAMUSCULAR | Status: DC | PRN

## 2017-07-06 MED ORDER — ONDANSETRON HCL 4 MG/2ML IJ SOLN: 4.0000 mg | Freq: Four times a day (QID) | INTRAMUSCULAR | Status: DC | PRN

## 2017-07-06 MED ORDER — CLOPIDOGREL BISULFATE 75 MG PO TABS: 75.0000 mg | ORAL_TABLET | Freq: Every day | ORAL | Status: DC

## 2017-07-06 MED ORDER — SODIUM CHLORIDE 0.9% FLUSH: 3.0000 mL | INTRAVENOUS | Status: DC | PRN

## 2017-07-06 MED ORDER — HEPARIN SODIUM (PORCINE) 1000 UNIT/ML IJ SOLN
INTRAMUSCULAR | Status: AC
  Filled 2017-07-06: qty 1

## 2017-07-06 MED ORDER — HYDROMORPHONE HCL 1 MG/ML IJ SOLN: 1.0000 mg | Freq: Once | INTRAMUSCULAR | Status: DC | PRN

## 2017-07-06 MED ORDER — SODIUM CHLORIDE 0.9 % IV SOLN: 250.0000 mL | INTRAVENOUS | Status: DC | PRN

## 2017-07-06 MED ORDER — FENTANYL CITRATE (PF) 100 MCG/2ML IJ SOLN
INTRAMUSCULAR | Status: AC
  Filled 2017-07-06: qty 2

## 2017-07-06 MED ORDER — HYDRALAZINE HCL 20 MG/ML IJ SOLN: 5.0000 mg | INTRAMUSCULAR | Status: DC | PRN

## 2017-07-06 MED ORDER — SODIUM CHLORIDE 0.9 % IV SOLN: INTRAVENOUS | Status: DC

## 2017-07-06 MED ORDER — LIDOCAINE-EPINEPHRINE (PF) 1 %-1:200000 IJ SOLN
INTRAMUSCULAR | Status: AC
  Filled 2017-07-06: qty 30

## 2017-07-06 MED ORDER — SODIUM CHLORIDE 0.9 % IV BOLUS
250.0000 mL | Freq: Once | INTRAVENOUS | Status: AC
  Administered 2017-07-06: 250 mL via INTRAVENOUS

## 2017-07-06 MED ORDER — ACETAMINOPHEN 325 MG PO TABS: 650.0000 mg | ORAL_TABLET | ORAL | Status: DC | PRN

## 2017-07-06 SURGICAL SUPPLY — 14 items
BALLN ULTRVRSE 3X100X150 (BALLOONS) ×2
BALLOON ULTRVRSE 3X100X150 (BALLOONS) ×1 IMPLANT
CATH BEACON 5 .038 100 VERT TP (CATHETERS) ×1 IMPLANT
CATH PIG 70CM (CATHETERS) ×2 IMPLANT
DEVICE PRESTO INFLATION (MISCELLANEOUS) ×1 IMPLANT
DEVICE STARCLOSE SE CLOSURE (Vascular Products) ×1 IMPLANT
PACK ANGIOGRAPHY (CUSTOM PROCEDURE TRAY) ×2 IMPLANT
SHEATH BRITE TIP 5FRX11 (SHEATH) ×2 IMPLANT
SHEATH SHUTTLE 6FRX80 (SHEATH) ×1 IMPLANT
SYR MEDRAD MARK V 150ML (SYRINGE) ×1 IMPLANT
TUBING CONTRAST HIGH PRESS 72 (TUBING) ×1 IMPLANT
WIRE G V18X300CM (WIRE) ×1 IMPLANT
WIRE J 3MM .035X145CM (WIRE) ×2 IMPLANT
WIRE MAGIC TOR.035 180C (WIRE) ×1 IMPLANT

## 2017-07-06 NOTE — Op Note (Signed)
[facility]  Percutaneous Study/Intervention Procedural Note   Date of Surgery: 07/06/2017  Surgeon(s):Laray Rivkin    Assistants:none  Pre-operative Diagnosis: PAD with ulceration right lower extremity  Post-operative diagnosis:  Same  Procedure(s) Performed:             1.  Ultrasound guidance for vascular access left femoral artery             2.  Catheter placement into right peroneal artery and right posterior tibial artery from left femoral approach             3.  Aortogram and selective right lower extremity angiogram             4.  Percutaneous transluminal angioplasty of proximal right peroneal artery with 3 mm diameter by 10 cm length angioplasty balloon             5.   Percutaneous transluminal angioplasty of the tibioperoneal trunk and proximal posterior tibial artery as well as the mid to distal posterior tibial artery with 2 inflations with a 3 mm diameter by 10 cm length angioplasty balloon  6.   StarClose closure device left femoral artery  EBL: 20 cc  Contrast: 55 cc  Fluoro Time: 5.0 minutes  Moderate Conscious Sedation Time: approximately 30 minutes using 2 mg of Versed and 50 Mcg of Fentanyl              Indications:  Patient is a 77 y.o.male with nonhealing ulceration of the right foot. The patient has noninvasive study showing reasonably good flow but probably some tibial disease bilaterally. The patient is brought in for angiography for further evaluation and potential treatment.  Due to the limb threatening nature of the situation, angiogram was performed for attempted limb salvage. The patient is aware that if the procedure fails, amputation would be expected.  The patient also understands that even with successful revascularization, amputation may still be required due to the severity of the situation.  Risks and benefits are discussed and informed consent is obtained.   Procedure:  The patient was identified and appropriate procedural  time out was performed.  The patient was then placed supine on the table and prepped and draped in the usual sterile fashion. Moderate conscious sedation was administered during a face to face encounter with the patient throughout the procedure with my supervision of the RN administering medicines and monitoring the patient's vital signs, pulse oximetry, telemetry and mental status throughout from the start of the procedure until the patient was taken to the recovery room. Ultrasound was used to evaluate the left common femoral artery.  It was patent .  A digital ultrasound image was acquired.  A Seldinger needle was used to access the left common femoral artery under direct ultrasound guidance and a permanent image was performed.  A 0.035 J wire was advanced without resistance and a 5Fr sheath was placed.  Pigtail catheter was placed into the aorta and an AP aortogram was performed. This demonstrated normal renal arteries, aorta was somewhat ectatic but not frankly aneurysmal.  No stenosis was seen in the aorta or iliac arteries.. I then crossed the aortic bifurcation and advanced to the right femoral head. Selective right lower extremity angiogram was then performed. This demonstrated normal common femoral artery profunda femoris artery.  The superficial femoral artery had some mild stenosis at its origin but this was less than 30%.  He had some diffuse calcific disease but no significant stenosis throughout its  course.  The popliteal artery was normal.  There was a reasonably normal tibial trifurcation with the anterior tibial artery occluded just beyond its origin.  The peroneal artery had about a 70 to 80% stenosis in its proximal segment just below the tibioperoneal trunk.  Similarly, the posterior tibial artery also had disease from the tibioperoneal trunk that continued down into the proximal segment that was in the 70% range.  Further down the posterior tibial artery in the distal segment was a short focal  calcific stenosis of about 80%. The patient was systemically heparinized and a 6 French 80 cm sheath was then placed over the Magic torque wire. I then used a Kumpe catheter and the V 18 wire to navigate down into the tibial vessels and across the peroneal stenosis.  Diagnostic catheter was then removed and proceeded with intervention.  A 3 mm diameter by 10 cm length angioplasty balloon was inflated to 10 atm for 1 minute and the proximal peroneal artery.  Completion angiogram showed about a 30% residual stenosis.  I then replaced the Kumpe catheter and cannulated the posterior tibial artery without difficulty advancing the catheter and the wire down distally and parking the wire into the foot.  The 3 mm diameter by 10 cm length angioplasty balloon was then inflated in the mid to distal posterior tibial artery up to 12 atm for 1 minute.  The balloon was pulled back to the origin of the posterior tibial artery and just into the tibioperoneal trunk and the proximal posterior tibial artery was treated with an inflation to 12 atm for 1 minute.  Completion angiogram following this showed about a 20% residual stenosis in the proximal posterior tibial artery and about a 15 to 20% residual stenosis in the mid to distal posterior tibial artery.  There is now two-vessel runoff distally. I elected to terminate the procedure. The sheath was removed and StarClose closure device was deployed in the left femoral artery with excellent hemostatic result. The patient was taken to the recovery room in stable condition having tolerated the procedure well.  Findings:               Aortogram:  Normal renal arteries, aorta was somewhat ectatic but not frankly aneurysmal.  No stenosis was seen in the aorta or iliac arteries.             Right lower Extremity: normal common femoral artery profunda femoris artery.  The superficial femoral artery had some mild stenosis at its origin but this was less than 30%.  He had some diffuse  calcific disease but no significant stenosis throughout its course.  The popliteal artery was normal.  There was a reasonably normal tibial trifurcation with the anterior tibial artery occluded just beyond its origin.  The peroneal artery had about a 70 to 80% stenosis in its proximal segment just below the tibioperoneal trunk.  Similarly, the posterior tibial artery also had disease from the tibioperoneal trunk that continued down into the proximal segment that was in the 70% range.  Further down the posterior tibial artery in the distal segment was a short focal calcific stenosis of about 80%.    Disposition: Patient was taken to the recovery room in stable condition having tolerated the procedure well.  Complications: None  [name] 07/06/2017 9:08 AM   This note was created with Dragon Medical transcription system. Any errors in dictation are purely unintentional.

## 2017-07-06 NOTE — Discharge Instructions (Signed)
°Moderate Conscious Sedation, Adult, Care After °These instructions provide you with information about caring for yourself after your procedure. Your health care provider may also give you more specific instructions. Your treatment has been planned according to current medical practices, but problems sometimes occur. Call your health care provider if you have any problems or questions after your procedure. °What can I expect after the procedure? °After your procedure, it is common: °· To feel sleepy for several hours. °· To feel clumsy and have poor balance for several hours. °· To have poor judgment for several hours. °· To vomit if you eat too soon. ° °Follow these instructions at home: °For at least 24 hours after the procedure: ° °· Do not: °? Participate in activities where you could fall or become injured. °? Drive. °? Use heavy machinery. °? Drink alcohol. °? Take sleeping pills or medicines that cause drowsiness. °? Make important decisions or sign legal documents. °? Take care of children on your own. °· Rest. °Eating and drinking °· Follow the diet recommended by your health care provider. °· If you vomit: °? Drink water, juice, or soup when you can drink without vomiting. °? Make sure you have little or no nausea before eating solid foods. °General instructions °· Have a responsible adult stay with you until you are awake and alert. °· Take over-the-counter and prescription medicines only as told by your health care provider. °· If you smoke, do not smoke without supervision. °· Keep all follow-up visits as told by your health care provider. This is important. °Contact a health care provider if: °· You keep feeling nauseous or you keep vomiting. °· You feel light-headed. °· You develop a rash. °· You have a fever. °Get help right away if: °· You have trouble breathing. °This information is not intended to replace advice given to you by your health care provider. Make sure you discuss any questions you  have with your health care provider. °Document Released: 10/13/2012 Document Revised: 05/28/2015 Document Reviewed: 04/14/2015 °Elsevier Interactive Patient Education © 2018 [employer] ° ° ° °Femoral Site Care °Refer to this sheet in the next few weeks. These instructions provide you with information about caring for yourself after your procedure. Your health care provider may also give you more specific instructions. Your treatment has been planned according to current medical practices, but problems sometimes occur. Call your health care provider if you have any problems or questions after your procedure. °What can I expect after the procedure? °After your procedure, it is typical to have the following: °· Bruising at the site that usually fades within 1-2 weeks. °· Blood collecting in the tissue (hematoma) that may be painful to the touch. It should usually decrease in size and tenderness within 1-2 weeks. ° °Follow these instructions at home: °· Take medicines only as directed by your health care provider. °· You may shower 24-48 hours after the procedure or as directed by your health care provider. Remove the bandage (dressing) and gently wash the site with plain soap and water. Pat the area dry with a clean towel. Do not rub the site, because this may cause bleeding. °· Do not take baths, swim, or use a hot tub until your health care provider approves. °· Check your insertion site every day for redness, swelling, or drainage. °· Do not apply powder or lotion to the site. °· Limit use of stairs to twice a day for the first 2-3 days or as directed by your health care   provider. °· Do not squat for the first 2-3 days or as directed by your health care provider. °· Do not lift over 10 lb (4.5 kg) for 5 days after your procedure or as directed by your health care provider. °· Ask your health care provider when it is okay to: °? Return to work or school. °? Resume usual physical activities or sports. °? Resume  sexual activity. °· Do not drive home if you are discharged the same day as the procedure. Have someone else drive you. °· You may drive 24 hours after the procedure unless otherwise instructed by your health care provider. °· Do not operate machinery or power tools for 24 hours after the procedure or as directed by your health care provider. °· If your procedure was done as an outpatient procedure, which means that you went home the same day as your procedure, a responsible adult should be with you for the first 24 hours after you arrive home. °· Keep all follow-up visits as directed by your health care provider. This is important. °Contact a health care provider if: °· You have a fever. °· You have chills. °· You have increased bleeding from the site. Hold pressure on the site. °Get help right away if: °· You have unusual pain at the site. °· You have redness, warmth, or swelling at the site. °· You have drainage (other than a small amount of blood on the dressing) from the site. °· The site is bleeding, and the bleeding does not stop after 30 minutes of holding steady pressure on the site. °· Your leg or foot becomes pale, cool, tingly, or numb. °This information is not intended to replace advice given to you by your health care provider. Make sure you discuss any questions you have with your health care provider. °Document Released: 08/26/2013 Document Revised: 05/31/2015 Document Reviewed: 07/12/2013 °Elsevier Interactive Patient Education © 2018 Else[employer] Inc[employer] ° °

## 2017-07-06 NOTE — H&P (Signed)
[name] Admission History & Physical  MRN : [id number]  [name] is a 77 y.o. (1940/04/26) male who presents with chief complaint of No chief complaint on file. [name]  History of Present Illness: Patient presents today for right lower extremity angiogram.  He was seen in the office a little bit over a month ago for a chronic, nonhealing ulceration of the right leg.  This has been very slow to heal.  His venous evaluation was basically normal.  He has been getting excellent wound care by the wound care center, despite this the wound has been poorly healing.  Noninvasive arterial study showed what appeared to be decent flow in both lower extremities but possibly some tibial disease.  As this ulceration has been chronic and nonhealing, angiogram is indicated for further evaluation and potentially treatment.  No fevers or chills.  The ulcer is mildly painful.  Current Facility-Administered Medications  Medication Dose Route Frequency Provider Last Rate Last Dose  . 0.9 %  sodium chloride infusion   Intravenous Continuous Stegmayer, Kimberly A, PA-C 75 mL/hr at 07/06/17 0742    . ceFAZolin (ANCEF) 2-4 GM/100ML-% IVPB           . ceFAZolin (ANCEF) IVPB 2g/100 mL premix  2 g Intravenous Once Stegmayer, Kimberly A, PA-C      . famotidine (PEPCID) tablet 40 mg  40 mg Oral PRN Stegmayer, [name], PA-C      . HYDROmorphone (DILAUDID) injection 1 mg  1 mg Intravenous Once PRN Stegmayer, Kimberly A, PA-C      . methylPREDNISolone sodium succinate (SOLU-MEDROL) 125 mg/2 mL injection 125 mg  125 mg Intravenous PRN Stegmayer, Kimberly A, PA-C      . ondansetron (ZOFRAN) injection 4 mg  4 mg Intravenous Q6H PRN Stegmayer, [name], PA-C        Past Medical History:  Diagnosis Date  . AICD (automatic cardioverter/defibrillator) present    [employer]  . Arthritis    RA  . Balance problem   . CHF (congestive heart failure) ([place])   . Collagen vascular disease  ([place])    RA since 2014  . Coronary artery disease   . Deviated nasal septum   . Dysrhythmia   . Hearing loss    TINNITUS  . High cholesterol   . Hypothyroidism   . Leg weakness   . Myocardial infarction ([place])   . Presence of permanent cardiac pacemaker    WITH DEFIBRILATOR ([employer])  . Seasonal allergies    RHINITIS  . Shortness of breath dyspnea    COUGH  . Sinus headache   . Sinusitis    CHRONIC  . Sleep apnea   . Vertigo    DYSEQUILIBRIUM    Past Surgical History:  Procedure Laterality Date  . CARDIAC ELECTROPHYSIOLOGY STUDY AND ABLATION    . CARDIAC SURGERY    . CATARACT EXTRACTION     BILATERAL  . CORONARY ANGIOPLASTY     STENTS  . CORONARY ARTERY BYPASS GRAFT  1992  . heart pump    . INSERT / REPLACE / REMOVE PACEMAKER    . SEPTOPLASTY      Social History Social History   Tobacco Use  . Smoking status: Never Smoker  . Smokeless tobacco: Never Used  Substance Use Topics  . Alcohol use: No    Alcohol/week: 0.0 oz  . Drug use: No    Family History Family History  Problem Relation Age of Onset  .  Alzheimer's disease Mother   No bleeding disorders, clotting disorders, autoimmune diseases, or aneurysms  Allergies  Allergen Reactions  . Bactrim [Sulfamethoxazole-Trimethoprim] Other (See Comments)    Severe dehydration  . Doxycycline Other (See Comments)    sunburn  . Pravastatin Other (See Comments)    Reaction:  Joint pain      REVIEW OF SYSTEMS (Negative unless checked)  Constitutional: [] Weight loss  [] Fever  [] Chills Cardiac: [] Chest pain   [] Chest pressure   [x] Palpitations   [] Shortness of breath when laying flat   [] Shortness of breath at rest   [x] Shortness of breath with exertion. Vascular:  [] Pain in legs with walking   [] Pain in legs at rest   [] Pain in legs when laying flat   [] Claudication   [] Pain in feet when walking  [] Pain in feet at rest  [] Pain in feet when laying flat   [] History of DVT   [] Phlebitis   [] Swelling in  legs   [] Varicose veins   [x] Non-healing ulcers Pulmonary:   [] Uses home oxygen   [] Productive cough   [] Hemoptysis   [] Wheeze  [] COPD   [] Asthma Neurologic:  [] Dizziness  [] Blackouts   [] Seizures   [] History of stroke   [] History of TIA  [] Aphasia   [] Temporary blindness   [] Dysphagia   [] Weakness or numbness in arms   [] Weakness or numbness in legs Musculoskeletal:  [x] Arthritis   [] Joint swelling   [x] Joint pain   [] Low back pain Hematologic:  [] Easy bruising  [] Easy bleeding   [] Hypercoagulable state   [] Anemic  [] Hepatitis Gastrointestinal:  [] Blood in stool   [] Vomiting blood  [] Gastroesophageal reflux/heartburn   [] Difficulty swallowing. Genitourinary:  [] Chronic kidney disease   [] Difficult urination  [] Frequent urination  [] Burning with urination   [] Blood in urine Skin:  [] Rashes   [x] Ulcers   [x] Wounds Psychological:  [] History of anxiety   []  History of major depression.  Physical Examination  Vitals:   07/06/17 0701  BP: (!) 100/51  Pulse: 60  Resp: 16  Temp: 97.8 F (36.6 C)  TempSrc: Oral  SpO2: 95%  Weight: 171 lb 11.2 oz (77.9 kg)  Height: 5\' 9"  (1.753 m)   Body mass index is 25.36 kg/m. Gen: WD/WN, NAD Head: [place]/AT, No temporalis wasting.  Ear/Nose/Throat: Hearing grossly intact, nares w/o erythema or drainage, oropharynx w/o Erythema/Exudate,  Eyes: Conjunctiva clear, sclera non-icteric Neck: Trachea midline.  No JVD.  Pulmonary:  Good air movement, respirations not labored, no use of accessory muscles.  Cardiac: Irregularly irregular Vascular:  Vessel Right Left  Radial Palpable Palpable                          PT  1+ palpable  1+ palpable  DP  1+ palpable Palpable   Musculoskeletal: M/S 5/5 throughout.  Extremities without ischemic changes.  No deformity or atrophy.  Neurologic: Sensation grossly intact in extremities.  Symmetrical.  Speech is fluent. Motor exam as listed above. Psychiatric: Judgment intact, Mood & affect appropriate for pt's  clinical situation. Dermatologic: chronic RLE ulceration dressed today     CBC Lab Results  Component Value Date   WBC 11.9 (H) 02/20/2017   HGB 10.8 (L) 02/20/2017   HCT 33.3 (L) 02/20/2017   MCV 97.3 02/20/2017   PLT 183 02/20/2017    BMET    Component Value Date/Time   NA 130 (L) 02/22/2017 0456   NA 138 12/25/2013 1241   K 3.7 02/22/2017 0456   K 5.4 (H)  12/25/2013 1241   CL 101 02/22/2017 0456   CL 105 12/25/2013 1241   CO2 17 (L) 02/22/2017 0456   CO2 24 12/25/2013 1241   GLUCOSE 85 02/22/2017 0456   GLUCOSE 91 12/25/2013 1241   BUN 12 07/03/2017 1031   BUN 21 (H) 12/25/2013 1241   CREATININE 1.00 07/03/2017 1031   CREATININE 1.09 12/25/2013 1241   CALCIUM 7.8 (L) 02/22/2017 0456   CALCIUM 8.7 12/25/2013 1241   GFRNONAA >60 07/03/2017 1031   GFRNONAA >60 12/25/2013 1241   GFRNONAA 36 (L) 09/28/2013 0648   GFRAA >60 07/03/2017 1031   GFRAA >60 12/25/2013 1241   GFRAA 41 (L) 09/28/2013 0648   Estimated Creatinine Clearance: 61.9 mL/min (by C-G formula based on SCr of 1 mg/dL).  COAG Lab Results  Component Value Date   INR 0.94 12/02/2016   INR 1.09 07/14/2014   INR 1.0 09/28/2013    Radiology No results found.  Assessment/Plan 1.  Ulceration right lower extremity.  Noninvasive studies showed decent flow but possibly some tibial disease.  This is been long-standing and poorly healing, so angiogram should be performed.  Risks and benefits have been discussed with the patient and he is agreeable to proceed 2.  Coronary disease.  Monitor rhythm strips and use nitrates and oxygen as needed for any chest pain.  Multiple previous interventions 3.  Dysrhythmia.  Status post AICD placement.  Monitor rhythm strip and continue to observe the patient for any chest pain or hypotension 4.  Hyperlipidemia. lipid control important in reducing the progression of atherosclerotic disease. Continue statin therapy    [name], MD  07/06/2017 8:01 AM

## 2017-07-07 DIAGNOSIS — M059 Rheumatoid arthritis with rheumatoid factor, unspecified: Secondary | ICD-10-CM | POA: Diagnosis not present

## 2017-07-08 ENCOUNTER — Encounter: Payer: Medicare Other | Attending: Internal Medicine | Admitting: Internal Medicine

## 2017-07-08 DIAGNOSIS — I70235 Atherosclerosis of native arteries of right leg with ulceration of other part of foot: Secondary | ICD-10-CM | POA: Diagnosis not present

## 2017-07-08 DIAGNOSIS — I255 Ischemic cardiomyopathy: Secondary | ICD-10-CM | POA: Insufficient documentation

## 2017-07-08 DIAGNOSIS — L97512 Non-pressure chronic ulcer of other part of right foot with fat layer exposed: Secondary | ICD-10-CM | POA: Diagnosis not present

## 2017-07-08 DIAGNOSIS — L97312 Non-pressure chronic ulcer of right ankle with fat layer exposed: Secondary | ICD-10-CM | POA: Diagnosis not present

## 2017-07-08 DIAGNOSIS — L97511 Non-pressure chronic ulcer of other part of right foot limited to breakdown of skin: Secondary | ICD-10-CM | POA: Insufficient documentation

## 2017-07-08 DIAGNOSIS — I70233 Atherosclerosis of native arteries of right leg with ulceration of ankle: Secondary | ICD-10-CM | POA: Diagnosis not present

## 2017-07-10 DIAGNOSIS — I255 Ischemic cardiomyopathy: Secondary | ICD-10-CM | POA: Diagnosis not present

## 2017-07-10 DIAGNOSIS — L97311 Non-pressure chronic ulcer of right ankle limited to breakdown of skin: Secondary | ICD-10-CM | POA: Diagnosis not present

## 2017-07-10 DIAGNOSIS — I110 Hypertensive heart disease with heart failure: Secondary | ICD-10-CM | POA: Diagnosis not present

## 2017-07-10 DIAGNOSIS — I5023 Acute on chronic systolic (congestive) heart failure: Secondary | ICD-10-CM | POA: Diagnosis not present

## 2017-07-10 DIAGNOSIS — I2510 Atherosclerotic heart disease of native coronary artery without angina pectoris: Secondary | ICD-10-CM | POA: Diagnosis not present

## 2017-07-10 DIAGNOSIS — L97511 Non-pressure chronic ulcer of other part of right foot limited to breakdown of skin: Secondary | ICD-10-CM | POA: Diagnosis not present

## 2017-07-10 NOTE — Progress Notes (Signed)
BRYCEN, BEAN ([id number]) Visit Report for 07/08/2017 Arrival Information Details Patient Name: [name], [name] Date of Service: 07/08/2017 1:45 PM Medical Record Number: [id number] Patient Account Number: [id number] Date of Birth/Sex: 05/12/1940 (76 y.o. M) Treating RN: Roger Shelter Primary Care Lakia Gritton: Cyndi Bender Other Clinician: Referring Aaryanna Hyden: Cyndi Bender Treating Bessie Boyte/Extender: Tito Dine in Treatment: 13 Visit Information History Since Last Visit All ordered tests and consults were completed: No Patient Arrived: Cane Added or deleted any medications: No Arrival Time: 13:59 Any new allergies or adverse reactions: No Accompanied By: wife Had a fall or experienced change in No Transfer Assistance: None activities of daily living that may affect Patient Identification Verified: Yes risk of falls: Secondary Verification Process Completed: Yes Signs or symptoms of abuse/neglect since last visito No Patient Requires Transmission-Based No Hospitalized since last visit: No Precautions: Implantable device outside of the clinic excluding No Patient Has Alerts: Yes cellular tissue based products placed in the center Patient Alerts: R ABI since last visit: >220 Pain Present Now: No L ABI >220 Electronic Signature(s) Signed: 07/08/2017 3:34:51 PM By: Roger Shelter Entered By: Roger Shelter on 07/08/2017 14:00:32 Yandell, [name] ([id number]) -------------------------------------------------------------------------------- Encounter Discharge Information Details Patient Name: [name] Date of Service: 07/08/2017 1:45 PM Medical Record Number: [id number] Patient Account Number: [id number] Date of Birth/Sex: 07/05/1940 (76 y.o. M) Treating RN: Montey Hora Primary Care Tora Prunty: Cyndi Bender Other Clinician: Referring Allina Riches: Cyndi Bender Treating Cherl Gorney/Extender: Tito Dine in Treatment: 13 Encounter  Discharge Information Items Discharge Condition: Stable Ambulatory Status: Cane Discharge Destination: Home Transportation: Private Auto Accompanied By: spouse Schedule Follow-up Appointment: Yes Clinical Summary of Care: Electronic Signature(s) Signed: 07/08/2017 3:20:20 PM By: Montey Hora Entered By: Montey Hora on 07/08/2017 15:20:20 Schlotter, [name] ([id number]) -------------------------------------------------------------------------------- Lower Extremity Assessment Details Patient Name: [name] Date of Service: 07/08/2017 1:45 PM Medical Record Number: [id number] Patient Account Number: [id number] Date of Birth/Sex: Oct 24, 1940 (76 y.o. M) Treating RN: Roger Shelter Primary Care Eural Holzschuh: Cyndi Bender Other Clinician: Referring Damiana Berrian: Cyndi Bender Treating Everline Mahaffy/Extender: Tito Dine in Treatment: 13 Edema Assessment Assessed: [Left: No] [Right: No] Edema: [Left: Yes] [Right: Yes] Calf Left: Right: Point of Measurement: 27 cm From Medial Instep 30 cm 28 cm Ankle Left: Right: Point of Measurement: 11 cm From Medial Instep 25.2 cm 23.5 cm Vascular Assessment Claudication: Claudication Assessment [Left:None] [Right:None] Pulses: Dorsalis Pedis Palpable: [Left:Yes] [Right:Yes] Posterior Tibial Extremity colors, hair growth, and conditions: Extremity Color: [Left:Hyperpigmented] [Right:Hyperpigmented] Hair Growth on Extremity: [Left:Yes] [Right:Yes] Temperature of Extremity: [Left:Warm] [Right:Warm] Capillary Refill: [Left:< 3 seconds] [Right:< 3 seconds] Toe Nail Assessment Left: Right: Thick: Yes Yes Discolored: Yes Yes Deformed: Yes Yes Improper Length and Hygiene: Yes Yes Electronic Signature(s) Signed: 07/08/2017 3:34:51 PM By: Roger Shelter Entered By: Roger Shelter on 07/08/2017 14:10:52 Ostrand, [name] ([id number]) -------------------------------------------------------------------------------- Multi Wound  Chart Details Patient Name: [name] Date of Service: 07/08/2017 1:45 PM Medical Record Number: [id number] Patient Account Number: [id number] Date of Birth/Sex: June 09, 1940 (76 y.o. M) Treating RN: Cornell Barman Primary Care Jaela Yepez: Cyndi Bender Other Clinician: Referring Timon Geissinger: Cyndi Bender Treating Lachanda Buczek/Extender: Tito Dine in Treatment: 13 Vital Signs Height(in): 68 Pulse(bpm): 32 Weight(lbs): Blood Pressure(mmHg): 95/47 Body Mass Index(BMI): Temperature(F): 97.7 Respiratory Rate 18 (breaths/min): Photos: Wound Location: Right Lower Leg - Medial Right Malleolus - Lateral Right [name] - Lateral Wounding Event: Gradually Appeared Gradually Appeared Gradually Appeared Primary Etiology: Venous Leg Ulcer Arterial Insufficiency Ulcer Arterial Insufficiency Ulcer Comorbid History: Congestive Heart Failure,  Congestive Heart Failure, Congestive Heart Failure, Coronary Artery Disease, Coronary Artery Disease, Coronary Artery Disease, Hypertension, Myocardial Hypertension, Myocardial Hypertension, Myocardial Infarction, Osteoarthritis Infarction, Osteoarthritis Infarction, Osteoarthritis Date Acquired: 07/08/2017 03/23/2017 04/29/2017 Weeks of Treatment: 0 13 10 Wound Status: Open Open Open Measurements L x W x D 0.5x0.4x0.32 0.7x0.5x0.3 0.8x0.8x0.4 (cm) Area (cm) : 0.157 0.275 0.503 Volume (cm) : 0.05 0.082 0.201 % Reduction in Area: N/A -787.10% -608.50% % Reduction in Volume: N/A -2633.30% -2771.40% Starting Position 1 12 12  (o'clock): Ending Position 1 5 12  (o'clock): Maximum Distance 1 (cm): 0.3 0.3 Undermining: No Yes Yes Classification: Full Thickness Without Full Thickness Without Full Thickness With Exposed Exposed Support Structures Exposed Support Structures Support Structures Exudate Amount: Medium Large Large Exudate Type: Serous Serous Serous Exudate Color: amber amber amber Wound Margin: Distinct, outline attached Distinct, outline  attached Distinct, outline attached Granulation Amount: None Present (0%) Medium (34-66%) None Present (0%) Bingley, Jermaine E. ([id number]) Granulation Quality: N/A Red, Pink N/A Necrotic Amount: Large (67-100%) Medium (34-66%) Large (67-100%) Exposed Structures: Fat Layer (Subcutaneous Fascia: No Fat Layer (Subcutaneous Tissue) Exposed: Yes Fat Layer (Subcutaneous Tissue) Exposed: Yes Fascia: No Tissue) Exposed: No Bone: Yes Tendon: No Tendon: No Fascia: No Muscle: No Muscle: No Tendon: No Joint: No Joint: No Muscle: No Bone: No Bone: No Joint: No Epithelialization: None None None Debridement: N/A Debridement - Excisional Debridement - Excisional Pre-procedure N/A 14:22 14:22 Verification/Time Out Taken: Pain Control: N/A Other Other Tissue Debrided: N/A Subcutaneous, Slough Subcutaneous, Slough Level: N/A Skin/Subcutaneous Tissue Skin/Subcutaneous Tissue Debridement Area (sq cm): N/A 0.35 0.64 Instrument: N/A Curette Curette Bleeding: N/A Minimum Minimum Hemostasis Achieved: N/A Pressure Pressure Debridement Treatment N/A Procedure was tolerated well Procedure was tolerated well Response: Post Debridement N/A 0.7x0.5x0.3 0.8x0.9x0.5 Measurements L x W x D (cm) Post Debridement Volume: N/A 0.082 0.283 (cm) Periwound Skin Texture: Excoriation: No Callus: Yes Callus: Yes Induration: No Excoriation: No Callus: No Induration: No Crepitus: No Crepitus: No Rash: No Rash: No Scarring: No Scarring: No Periwound Skin Moisture: Maceration: No Maceration: No Maceration: Yes Dry/Scaly: No Dry/Scaly: No Periwound Skin Color: Atrophie Blanche: No Atrophie Blanche: No No Abnormalities Noted Cyanosis: No Cyanosis: No Ecchymosis: No Ecchymosis: No Erythema: No Erythema: No Hemosiderin Staining: No Hemosiderin Staining: No Mottled: No Mottled: No Pallor: No Pallor: No Rubor: No Rubor: No Temperature: No Abnormality No Abnormality No  Abnormality Tenderness on Palpation: Yes Yes Yes Wound Preparation: Ulcer Cleansing: Ulcer Cleansing: Ulcer Cleansing: Rinsed/Irrigated with Saline Rinsed/Irrigated with Saline, Rinsed/Irrigated with Saline, Other: soap and water Other: soap and water Topical Anesthetic Applied: Other: lidocaine 4% Topical Anesthetic Applied: Topical Anesthetic Applied: Other: lidocaine 4% Other: lidocaine 4% Procedures Performed: N/A Debridement Debridement Wound Number: 9 N/A N/A Photos: N/A N/A JAXXEN, VOONG ([id number]) Wound Location: Left Metatarsal head first N/A N/A Wounding Event: Gradually Appeared N/A N/A Primary Etiology: Pressure Ulcer N/A N/A Comorbid History: Congestive Heart Failure, N/A N/A Coronary Artery Disease, Hypertension, Myocardial Infarction, Osteoarthritis Date Acquired: 05/06/2017 N/A N/A Weeks of Treatment: 7 N/A N/A Wound Status: Open N/A N/A Measurements L x W x D 0.5x0.5x0.1 N/A N/A (cm) Area (cm) : 0.196 N/A N/A Volume (cm) : 0.02 N/A N/A % Reduction in Area: 0.00% N/A N/A % Reduction in Volume: 0.00% N/A N/A Undermining: No N/A N/A Classification: Category/Stage II N/A N/A Exudate Amount: Medium N/A N/A Exudate Type: Serous N/A N/A Exudate Color: amber N/A N/A Wound Margin: Flat and Intact N/A N/A Granulation Amount: None Present (0%) N/A N/A Granulation Quality: N/A N/A N/A  Necrotic Amount: Large (67-100%) N/A N/A Exposed Structures: Fat Layer (Subcutaneous N/A N/A Tissue) Exposed: Yes Fascia: No Tendon: No Muscle: No Joint: No Bone: No Epithelialization: None N/A N/A Debridement: N/A N/A N/A Pain Control: N/A N/A N/A Tissue Debrided: N/A N/A N/A Level: N/A N/A N/A Debridement Area (sq cm): N/A N/A N/A Instrument: N/A N/A N/A Bleeding: N/A N/A N/A Hemostasis Achieved: N/A N/A N/A Debridement Treatment N/A N/A N/A Response: Post Debridement N/A N/A N/A Measurements L x W x D (cm) Post Debridement Volume: N/A N/A  N/A (cm) Periwound Skin Texture: Callus: Yes N/A N/A Excoriation: No Induration: No Crepitus: No Taliaferro, Khalel E. ([id number]) Rash: No Scarring: No Periwound Skin Moisture: Maceration: Yes N/A N/A Dry/Scaly: No Periwound Skin Color: Atrophie Blanche: No N/A N/A Cyanosis: No Ecchymosis: No Erythema: No Hemosiderin Staining: No Mottled: No Pallor: No Rubor: No Temperature: No Abnormality N/A N/A Tenderness on Palpation: No N/A N/A Wound Preparation: Ulcer Cleansing: N/A N/A Rinsed/Irrigated with Saline Topical Anesthetic Applied: Other: lidocaine 4% Procedures Performed: N/A N/A N/A Treatment Notes Wound #10 (Right, Medial Lower Leg) 1. Cleansed with: Cleanse wound with antibacterial soap and water 2. Anesthetic Topical Lidocaine 4% cream to wound bed prior to debridement 4. Dressing Applied: Prisma Ag Santyl Ointment 5. Secondary Dressing Applied ABD Pad 7. Secured with Other (specify in notes) Notes kerlix/coban wrap, prisma to right anterior leg wound and santyl to both [name] wounds Wound #6 (Right, Lateral Malleolus) 1. Cleansed with: Cleanse wound with antibacterial soap and water 2. Anesthetic Topical Lidocaine 4% cream to wound bed prior to debridement 4. Dressing Applied: Prisma Ag Santyl Ointment 5. Secondary Dressing Applied ABD Pad 7. Secured with Other (specify in notes) Notes kerlix/coban wrap, prisma to right anterior leg wound and santyl to both [name] wounds Wound #8 (Right, Lateral [name]) Skaggs, Tayvian E. ([id number]) 1. Cleansed with: Cleanse wound with antibacterial soap and water 2. Anesthetic Topical Lidocaine 4% cream to wound bed prior to debridement 4. Dressing Applied: Prisma Ag Santyl Ointment 5. Secondary Dressing Applied ABD Pad 7. Secured with Other (specify in notes) Notes kerlix/coban wrap, prisma to right anterior leg wound and santyl to both [name] wounds Wound #9 (Left Metatarsal head first) 1. Cleansed  with: Clean wound with Normal Saline 2. Anesthetic Topical Lidocaine 4% cream to wound bed prior to debridement 3. Peri-wound Care: Skin Prep 4. Dressing Applied: Prisma Ag 5. Secondary Dressing Applied Bordered Foam Dressing Electronic Signature(s) Signed: 07/10/2017 8:34:07 AM By: Linton Ham MD Entered By: Linton Ham on 07/08/2017 16:06:55 Freiberger, [name] ([id number]) -------------------------------------------------------------------------------- Multi-Disciplinary Care Plan Details Patient Name: [name] Date of Service: 07/08/2017 1:45 PM Medical Record Number: [id number] Patient Account Number: [id number] Date of Birth/Sex: 1940/10/13 (76 y.o. M) Treating RN: Cornell Barman Primary Care Lavonta Tillis: Cyndi Bender Other Clinician: Referring Joeph Szatkowski: Cyndi Bender Treating Leilyn Frayre/Extender: Tito Dine in Treatment: 13 Active Inactive ` Abuse / Safety / Falls / Self Care Management Nursing Diagnoses: History of Falls Goals: Patient will remain injury free related to falls Date Initiated: 04/08/2017 Target Resolution Date: 05/08/2017 Goal Status: Active Interventions: Assess fall risk on admission and as needed Notes: ` Orientation to the Wound Care Program Nursing Diagnoses: Knowledge deficit related to the wound healing center program Goals: Patient/caregiver will verbalize understanding of the [place] Program Date Initiated: 04/08/2017 Target Resolution Date: 05/08/2017 Goal Status: Active Interventions: Provide education on orientation to the wound center Notes: ` Soft Tissue Infection Nursing Diagnoses: Impaired tissue integrity Potential for infection: soft tissue  Goals: Patient will remain free of wound infection Date Initiated: 04/08/2017 Target Resolution Date: 05/08/2017 Goal Status: Active ROLLO, FARQUHAR ([id number]) Interventions: Assess signs and symptoms of infection every visit Notes: ` Wound/Skin  Impairment Nursing Diagnoses: Impaired tissue integrity Goals: Ulcer/skin breakdown will heal within 14 weeks Date Initiated: 04/08/2017 Target Resolution Date: 07/20/2017 Goal Status: Active Interventions: Assess patient/caregiver ability to perform ulcer/skin care regimen upon admission and as needed Provide education on ulcer and skin care Treatment Activities: Topical wound management initiated : 04/08/2017 Notes: Electronic Signature(s) Signed: 07/08/2017 4:43:24 PM By: Gretta Cool, BSN, RN, CWS, Kim RN, BSN Entered By: Gretta Cool, BSN, RN, CWS, Kim on 07/08/2017 14:22:21 Gudiel, [name] ([id number]) -------------------------------------------------------------------------------- Pain Assessment Details Patient Name: [name] Date of Service: 07/08/2017 1:45 PM Medical Record Number: [id number] Patient Account Number: [id number] Date of Birth/Sex: 27-Mar-1940 (76 y.o. M) Treating RN: Roger Shelter Primary Care River Ambrosio: Cyndi Bender Other Clinician: Referring Theodore Virgin: Cyndi Bender Treating Jeven Topper/Extender: Tito Dine in Treatment: 13 Active Problems Location of Pain Severity and Description of Pain Patient Has Paino No Site Locations Pain Management and Medication Current Pain Management: Electronic Signature(s) Signed: 07/08/2017 3:34:51 PM By: Roger Shelter Entered By: Roger Shelter on 07/08/2017 14:00:38 Achey, [name] ([id number]) -------------------------------------------------------------------------------- Patient/Caregiver Education Details Patient Name: [name] Date of Service: 07/08/2017 1:45 PM Medical Record Number: [id number] Patient Account Number: [id number] Date of Birth/Gender: 06-Nov-1940 (76 y.o. M) Treating RN: Montey Hora Primary Care Physician: Cyndi Bender Other Clinician: Referring Physician: Cyndi Bender Treating Physician/Extender: Tito Dine in Treatment: 13 Education  Assessment Education Provided To: Patient Education Topics Provided Venous: Handouts: Other: leg elevation Methods: Explain/Verbal Responses: State content correctly Electronic Signature(s) Signed: 07/08/2017 3:51:22 PM By: Montey Hora Entered By: Montey Hora on 07/08/2017 15:20:46 Yeager, [name] ([id number]) -------------------------------------------------------------------------------- Wound Assessment Details Patient Name: [name] Date of Service: 07/08/2017 1:45 PM Medical Record Number: [id number] Patient Account Number: [id number] Date of Birth/Sex: 1940/11/29 (76 y.o. M) Treating RN: Roger Shelter Primary Care Nakeem Murnane: Cyndi Bender Other Clinician: Referring Willy Vorce: Cyndi Bender Treating Jadi Deyarmin/Extender: Tito Dine in Treatment: 13 Wound Status Wound Number: 10 Primary Venous Leg Ulcer Etiology: Wound Location: Right Lower Leg - Medial Wound Open Wounding Event: Gradually Appeared Status: Date Acquired: 07/08/2017 Comorbid Congestive Heart Failure, Coronary Artery Weeks Of Treatment: 0 History: Disease, Hypertension, Myocardial Infarction, Clustered Wound: No Osteoarthritis Photos Photo Uploaded By: Roger Shelter on 07/08/2017 15:29:02 Wound Measurements Length: (cm) 0.5 Width: (cm) 0.4 Depth: (cm) 0.32 Area: (cm) 0.157 Volume: (cm) 0.05 % Reduction in Area: % Reduction in Volume: Epithelialization: None Tunneling: No Undermining: No Wound Description Full Thickness Without Exposed Support Classification: Structures Wound Margin: Distinct, outline attached Exudate Medium Amount: Exudate Type: Serous Exudate Color: amber Foul Odor After Cleansing: No Slough/Fibrino Yes Wound Bed Granulation Amount: None Present (0%) Exposed Structure Necrotic Amount: Large (67-100%) Fascia Exposed: No Necrotic Quality: Adherent Slough Fat Layer (Subcutaneous Tissue) Exposed: Yes Tendon Exposed: No Muscle Exposed:  No Joint Exposed: No Bone Exposed: No Mahr, Juluis E. ([id number]) Periwound Skin Texture Texture Color No Abnormalities Noted: No No Abnormalities Noted: No Callus: No Atrophie Blanche: No Crepitus: No Cyanosis: No Excoriation: No Ecchymosis: No Induration: No Erythema: No Rash: No Hemosiderin Staining: No Scarring: No Mottled: No Pallor: No Moisture Rubor: No No Abnormalities Noted: No Dry / Scaly: No Temperature / Pain Maceration: No Temperature: No Abnormality Tenderness on Palpation: Yes Wound Preparation Ulcer Cleansing: Rinsed/Irrigated with Saline Topical Anesthetic Applied: Other: lidocaine 4%, Treatment  Notes Wound #10 (Right, Medial Lower Leg) 1. Cleansed with: Cleanse wound with antibacterial soap and water 2. Anesthetic Topical Lidocaine 4% cream to wound bed prior to debridement 4. Dressing Applied: Prisma Ag Santyl Ointment 5. Secondary Dressing Applied ABD Pad 7. Secured with Other (specify in notes) Notes kerlix/coban wrap, prisma to right anterior leg wound and santyl to both [name] wounds Electronic Signature(s) Signed: 07/08/2017 3:34:51 PM By: Roger Shelter Entered By: Roger Shelter on 07/08/2017 14:08:36 Strahle, [name] ([id number]) -------------------------------------------------------------------------------- Wound Assessment Details Patient Name: [name] Date of Service: 07/08/2017 1:45 PM Medical Record Number: [id number] Patient Account Number: [id number] Date of Birth/Sex: 10/16/1940 (76 y.o. M) Treating RN: Roger Shelter Primary Care Morene Cecilio: Cyndi Bender Other Clinician: Referring Samon Dishner: Cyndi Bender Treating Kaytie Ratcliffe/Extender: Tito Dine in Treatment: 13 Wound Status Wound Number: 6 Primary Arterial Insufficiency Ulcer Etiology: Wound Location: Right Malleolus - Lateral Wound Open Wounding Event: Gradually Appeared Status: Date Acquired: 03/23/2017 Comorbid Congestive Heart  Failure, Coronary Artery Weeks Of Treatment: 13 History: Disease, Hypertension, Myocardial Infarction, Clustered Wound: No Osteoarthritis Photos Photo Uploaded By: Roger Shelter on 07/08/2017 15:29:03 Wound Measurements Length: (cm) 0.7 Width: (cm) 0.5 Depth: (cm) 0.3 Area: (cm) 0.275 Volume: (cm) 0.082 % Reduction in Area: -787.1% % Reduction in Volume: -2633.3% Epithelialization: None Tunneling: No Undermining: Yes Starting Position (o'clock): 12 Ending Position (o'clock): 5 Maximum Distance: (cm) 0.3 Wound Description Full Thickness Without Exposed Support Classification: Structures Wound Margin: Distinct, outline attached Exudate Large Amount: Exudate Type: Serous Exudate Color: amber Foul Odor After Cleansing: No Slough/Fibrino Yes Wound Bed Granulation Amount: Medium (34-66%) Exposed Structure Granulation Quality: Red, Pink Fascia Exposed: No Necrotic Amount: Medium (34-66%) Fat Layer (Subcutaneous Tissue) Exposed: No Buchheit, Sherrel E. ([id number]) Necrotic Quality: Adherent Slough Tendon Exposed: No Muscle Exposed: No Joint Exposed: No Bone Exposed: No Periwound Skin Texture Texture Color No Abnormalities Noted: No No Abnormalities Noted: No Callus: Yes Atrophie Blanche: No Crepitus: No Cyanosis: No Excoriation: No Ecchymosis: No Induration: No Erythema: No Rash: No Hemosiderin Staining: No Scarring: No Mottled: No Pallor: No Moisture Rubor: No No Abnormalities Noted: No Dry / Scaly: No Temperature / Pain Maceration: No Temperature: No Abnormality Tenderness on Palpation: Yes Wound Preparation Ulcer Cleansing: Rinsed/Irrigated with Saline, Other: soap and water, Topical Anesthetic Applied: Other: lidocaine 4%, Treatment Notes Wound #6 (Right, Lateral Malleolus) 1. Cleansed with: Cleanse wound with antibacterial soap and water 2. Anesthetic Topical Lidocaine 4% cream to wound bed prior to debridement 4. Dressing  Applied: Prisma Ag Santyl Ointment 5. Secondary Dressing Applied ABD Pad 7. Secured with Other (specify in notes) Notes kerlix/coban wrap, prisma to right anterior leg wound and santyl to both [name] wounds Electronic Signature(s) Signed: 07/08/2017 3:34:51 PM By: Roger Shelter Entered By: Roger Shelter on 07/08/2017 14:04:58 Greenwood, [name] ([id number]) -------------------------------------------------------------------------------- Wound Assessment Details Patient Name: [name] Date of Service: 07/08/2017 1:45 PM Medical Record Number: [id number] Patient Account Number: [id number] Date of Birth/Sex: 09/30/1940 (76 y.o. M) Treating RN: Roger Shelter Primary Care Salsabeel Gorelick: Cyndi Bender Other Clinician: Referring Weslie Rasmus: Cyndi Bender Treating Margueritte Guthridge/Extender: Tito Dine in Treatment: 13 Wound Status Wound Number: 8 Primary Arterial Insufficiency Ulcer Etiology: Wound Location: Right [name] - Lateral Wound Open Wounding Event: Gradually Appeared Status: Date Acquired: 04/29/2017 Comorbid Congestive Heart Failure, Coronary Artery Weeks Of Treatment: 10 History: Disease, Hypertension, Myocardial Infarction, Clustered Wound: No Osteoarthritis Photos Photo Uploaded By: Roger Shelter on 07/08/2017 15:29:36 Wound Measurements Length: (cm) 0.8 Width: (cm) 0.8 Depth: (cm) 0.4 Area: (cm)  0.503 Volume: (cm) 0.201 % Reduction in Area: -608.5% % Reduction in Volume: -2771.4% Epithelialization: None Tunneling: No Undermining: Yes Starting Position (o'clock): 12 Ending Position (o'clock): 12 Maximum Distance: (cm) 0.3 Wound Description Full Thickness With Exposed Support Classification: Structures Wound Margin: Distinct, outline attached Exudate Large Amount: Exudate Type: Serous Exudate Color: amber Foul Odor After Cleansing: No Slough/Fibrino Yes Wound Bed Granulation Amount: None Present (0%) Exposed Structure Necrotic  Amount: Large (67-100%) Fascia Exposed: No Necrotic Quality: Adherent Slough Fat Layer (Subcutaneous Tissue) Exposed: Yes Lang, Mukhtar E. ([id number]) Tendon Exposed: No Muscle Exposed: No Joint Exposed: No Bone Exposed: Yes Periwound Skin Texture Texture Color No Abnormalities Noted: No No Abnormalities Noted: No Callus: Yes Temperature / Pain Moisture Temperature: No Abnormality No Abnormalities Noted: No Tenderness on Palpation: Yes Maceration: Yes Wound Preparation Ulcer Cleansing: Rinsed/Irrigated with Saline, Other: soap and water, Topical Anesthetic Applied: Other: lidocaine 4%, Treatment Notes Wound #8 (Right, Lateral [name]) 1. Cleansed with: Cleanse wound with antibacterial soap and water 2. Anesthetic Topical Lidocaine 4% cream to wound bed prior to debridement 4. Dressing Applied: Prisma Ag Santyl Ointment 5. Secondary Dressing Applied ABD Pad 7. Secured with Other (specify in notes) Notes kerlix/coban wrap, prisma to right anterior leg wound and santyl to both [name] wounds Electronic Signature(s) Signed: 07/08/2017 3:34:51 PM By: Roger Shelter Entered By: Roger Shelter on 07/08/2017 14:03:43 Watkin, [name] ([id number]) -------------------------------------------------------------------------------- Wound Assessment Details Patient Name: [name] Date of Service: 07/08/2017 1:45 PM Medical Record Number: [id number] Patient Account Number: [id number] Date of Birth/Sex: 1941-01-03 (76 y.o. M) Treating RN: Roger Shelter Primary Care Roniqua Kintz: Cyndi Bender Other Clinician: Referring Jock Mahon: Cyndi Bender Treating Xiana Carns/Extender: Tito Dine in Treatment: 13 Wound Status Wound Number: 9 Primary Pressure Ulcer Etiology: Wound Location: Left Metatarsal head first Wound Open Wounding Event: Gradually Appeared Status: Date Acquired: 05/06/2017 Comorbid Congestive Heart Failure, Coronary Artery Weeks Of Treatment:  7 History: Disease, Hypertension, Myocardial Infarction, Clustered Wound: No Osteoarthritis Photos Photo Uploaded By: Roger Shelter on 07/08/2017 15:29:37 Wound Measurements Length: (cm) 0.5 Width: (cm) 0.5 Depth: (cm) 0.1 Area: (cm) 0.196 Volume: (cm) 0.02 % Reduction in Area: 0% % Reduction in Volume: 0% Epithelialization: None Tunneling: No Undermining: No Wound Description Classification: Category/Stage II Wound Margin: Flat and Intact Exudate Amount: Medium Exudate Type: Serous Exudate Color: amber Foul Odor After Cleansing: No Slough/Fibrino Yes Wound Bed Granulation Amount: None Present (0%) Exposed Structure Necrotic Amount: Large (67-100%) Fascia Exposed: No Necrotic Quality: Adherent Slough Fat Layer (Subcutaneous Tissue) Exposed: Yes Tendon Exposed: No Muscle Exposed: No Joint Exposed: No Bone Exposed: No Periwound Skin Texture Pasko, Keishon E. ([id number]) Texture Color No Abnormalities Noted: No No Abnormalities Noted: No Callus: Yes Atrophie Blanche: No Crepitus: No Cyanosis: No Excoriation: No Ecchymosis: No Induration: No Erythema: No Rash: No Hemosiderin Staining: No Scarring: No Mottled: No Pallor: No Moisture Rubor: No No Abnormalities Noted: No Dry / Scaly: No Temperature / Pain Maceration: Yes Temperature: No Abnormality Wound Preparation Ulcer Cleansing: Rinsed/Irrigated with Saline Topical Anesthetic Applied: Other: lidocaine 4%, Treatment Notes Wound #9 (Left Metatarsal head first) 1. Cleansed with: Clean wound with Normal Saline 2. Anesthetic Topical Lidocaine 4% cream to wound bed prior to debridement 3. Peri-wound Care: Skin Prep 4. Dressing Applied: Prisma Ag 5. Secondary Dressing Applied Bordered Foam Dressing Electronic Signature(s) Signed: 07/08/2017 3:34:51 PM By: Roger Shelter Entered By: Roger Shelter on 07/08/2017 14:05:33 Blackson, [name]  ([id number]) -------------------------------------------------------------------------------- Vitals Details Patient Name: [name] Date of Service: 07/08/2017 1:45 PM  Medical Record Number: [id number] Patient Account Number: [id number] Date of Birth/Sex: 07-21-1940 (77 y.o. M) Treating RN: Roger Shelter Primary Care Dimetrius Montfort: Cyndi Bender Other Clinician: Referring Doni Bacha: Cyndi Bender Treating Elverda Wendel/Extender: Tito Dine in Treatment: 13 Vital Signs Time Taken: 14:00 Temperature (F): 97.7 Height (in): 68 Pulse (bpm): 63 Respiratory Rate (breaths/min): 18 Blood Pressure (mmHg): 95/47 Reference Range: 80 - 120 mg / dl Electronic Signature(s) Signed: 07/08/2017 3:34:51 PM By: Roger Shelter Entered By: Roger Shelter on 07/08/2017 14:00:56

## 2017-07-10 NOTE — Progress Notes (Signed)
[name] [name] ([id number]) Visit Report for 07/08/2017 Debridement Details Patient Name: [name], [name] Date of Service: 07/08/2017 1:45 PM Medical Record Number: [id number] Patient Account Number: [id number] Date of Birth/Sex: 01/13/1940 (76 y.o. M) Treating RN: Cornell Barman Primary Care Provider: Cyndi Bender Other Clinician: Referring Provider: Cyndi Bender Treating Provider/Extender: Tito Dine in Treatment: 13 Debridement Performed for Wound #6 Right,Lateral Malleolus Assessment: Performed By: Physician Ricard Dillon, MD Debridement Type: Debridement Severity of Tissue Pre Fat layer exposed Debridement: Pre-procedure Verification/Time Yes - 14:22 Out Taken: Start Time: 14:22 Pain Control: Other : lidocaine 4% Total Area Debrided (L x W): 0.7 (cm) x 0.5 (cm) = 0.35 (cm) Tissue and other material Viable, Non-Viable, Slough, Subcutaneous, Slough debrided: Level: Skin/Subcutaneous Tissue Debridement Description: Excisional Instrument: Curette Bleeding: Minimum Hemostasis Achieved: Pressure End Time: 14:23 Response to Treatment: Procedure was tolerated well Level of Consciousness: Awake and Alert Post Debridement Measurements of Total Wound Length: (cm) 0.7 Width: (cm) 0.5 Depth: (cm) 0.3 Volume: (cm) 0.082 Character of Wound/Ulcer Post Debridement: Stable Severity of Tissue Post Debridement: Fat layer exposed Post Procedure Diagnosis Same as Pre-procedure Electronic Signature(s) Signed: 07/08/2017 4:43:24 PM By: Gretta Cool, BSN, RN, CWS, Kim RN, BSN Signed: 07/10/2017 8:34:07 AM By: Linton Ham MD Entered By: Gretta Cool, BSN, RN, CWS, Kim on 07/08/2017 14:23:54 [name] [name] ([id number]) -------------------------------------------------------------------------------- Debridement Details Patient Name: [name] [name] Date of Service: 07/08/2017 1:45 PM Medical Record Number: [id number] Patient Account Number: [id number] Date of Birth/Sex:  01-07-1941 (77 y.o. M) Treating RN: Cornell Barman Primary Care Provider: Cyndi Bender Other Clinician: Referring Provider: Cyndi Bender Treating Provider/Extender: Tito Dine in Treatment: 13 Debridement Performed for Wound #8 Right,Lateral [name] Assessment: Performed By: Physician Ricard Dillon, MD Debridement Type: Debridement Severity of Tissue Pre Fat layer exposed Debridement: Pre-procedure Verification/Time Yes - 14:22 Out Taken: Start Time: 14:22 Pain Control: Other : lidocaine 4% Total Area Debrided (L x W): 0.8 (cm) x 0.8 (cm) = 0.64 (cm) Tissue and other material Viable, Non-Viable, Slough, Subcutaneous, Slough debrided: Level: Skin/Subcutaneous Tissue Debridement Description: Excisional Instrument: Curette Bleeding: Minimum Hemostasis Achieved: Pressure End Time: 14:23 Response to Treatment: Procedure was tolerated well Level of Consciousness: Awake and Alert Post Debridement Measurements of Total Wound Length: (cm) 0.8 Width: (cm) 0.9 Depth: (cm) 0.5 Volume: (cm) 0.283 Character of Wound/Ulcer Post Debridement: Stable Severity of Tissue Post Debridement: Fat layer exposed Post Procedure Diagnosis Same as Pre-procedure Electronic Signature(s) Signed: 07/08/2017 4:43:24 PM By: Gretta Cool, BSN, RN, CWS, Kim RN, BSN Signed: 07/10/2017 8:34:07 AM By: Linton Ham MD Entered By: Gretta Cool, BSN, RN, CWS, Kim on 07/08/2017 14:24:38 [name] [name] ([id number]) -------------------------------------------------------------------------------- HPI Details Patient Name: [name] [name] Date of Service: 07/08/2017 1:45 PM Medical Record Number: [id number] Patient Account Number: [id number] Date of Birth/Sex: 09/11/1940 (77 y.o. M) Treating RN: Cornell Barman Primary Care Provider: Cyndi Bender Other Clinician: Referring Provider: Cyndi Bender Treating Provider/Extender: Tito Dine in Treatment: 13 History of Present Illness HPI  Description: 04/08/17; this is a complex 77 year old man referred here from [facility]. He had been referred there for bilateral lower extremity edema with ulcer formation predominantly on the right calf but also the right [name]. He had been receiving Unna boots bilaterally. The history here is long. He is not a diabetic however ICU looking through late 2018 he was worked up for chronic headaches, elevated inflammatory markers including C-reactive protein and ESR. He went on to actually have a left temporal artery biopsy wasn't that  was negative.he received about 6 weeks of high-dose prednisone 60 mg with improvement in his inflammatory markers. He was admitted to hospital in late November with ventricular tachycardia syncope. He has known ischemic cardiomyopathy. He was admitted in the hospital in mid December. Apparently this was precipitated by a syncopal spell falling out of his scooter while at [place]. There was ventricular arrhythmia. He has an implantable defibrillator and echocardiogram showed severe LV dysfunction with an EF of 20% and valvular regurgitations including mild AR, moderate MR. There was no stenosis. He ruled in for a non-ST elevation MI in the setting of V. tach.his wife states that sometime during this hospitalization she noted multiple areas of skin change on the right lower calf which became evident just after he left the hospital. He was back in hospital in February with acute renal failure hyponatremia. This responded to fluid resuscitation.. Interestingly I can't see much description of his right leg at that point in time.he was followed by Dr. Nehemiah Massed of dermatology for the necrotic wounds on his right leg. Apparently a biopsy was planned at one point but not done although in some notes that suggests it was. I cannot see these results area He was noted to have a lot of edema. Was treated with bilateral Unna boots edges really helped with the swelling they  have been using Bactroban to small open areas predominantly on the right anterior lower leg His history is complicated by the fact that he has rheumatoid arthritis followed by rheumatology. He is followed by neurology for disabling headaches. At one point this was felt to be giant cell arteritis although a left temporal biopsy was apparently negative. He was given a prolonged course of prednisone at 60 mg which managed his sedimentation rates but apparently did not prove improve the headaches. This is been tapered to off on by rheumatology on 03/13/17 Vascular had plans to do a venous reflux workup as well as arterial studies in May. They also wanted to get him a lymphedema pump. As mentioned he's been using bacitracin under Unna boot wraps to both lower legs 04/15/17; the patient arrives with most of his wounds improved. These are small punched out wounds. Most of them remaining ones are on the right anterior calf with the most problematic over the right lateral malleolus. There are no new areas. The symptom complex or potential symptom complex we are dealing with his chronic disabling headaches with inflammatory markers not responsive to prednisone and with a negative temporal biopsy, lower extremity weakness, skin ulcerations just on the right leg. We have managed to get his arterial studies moved up to April 30. He has a rheumatology consult at [facility] in June. He has seen dermatology locally, rheumatology locally, neurology locally. 04/22/17; small punched out areas on the right leg anteriorly posteriorly. Most of these appear to have closed over. Some of them have eschar over the surface. The most problematic area appears to be over the right lateral malleolus. We've been using prisma to all of this. He has arterial studies on April 30 and a rheumatology consult at [facility] on June 28 04/29/17; most of the small punched out areas on the right leg posteriorly are closed. He has 2 or 3 openings anteriorly  but most of these appear to be on the way to closing. Still problematically over the right lateral malleolus and right lateral [name] with almost ischemic-looking eschar. His arterial studies that I ordered are due to be done next week on the 30th so we  should have them available for our next visit hopefully. He also saw a rheumatologist at [facility] and according to the patient he did 8 vials of blood. Finally he has scaling rash on his left [name] and what looks to be at [facility] area on the left anterior leg 05/06/17; most of the small punched out areas on the right leg posteriorly and anteriorly are closed. He still has one small one anteriorly one over the right lateral malleolus and one over the right lateral [name]. [name], [name] ([id number]) He had his arterial studies they didn't seem to do waveform analysis not exactly sure why however in any case is ABIs were noncompressible bilaterally. They did provide TBIs although looking at the pressures it appears that his TBIs are quite normal. I therefore went ahead and debrided the area over the right lateral malleolus and the right lateral [name] 05/13/17; most of the small punched out areas on the right leg posteriorly and anteriorly are closed. He continues to have problematic areas over the right lateral malleolus and the right lateral [name]. He still requiring aggressive debridement of these 2 wounds using silver collagen I reviewed the note from rheumatology I don't think they came up with a specific diagnosis although he is known to have seropositive RA. They did a panel of lab work when I was able to see his his AMA was negative, anti-smooth muscle antibodies negative antineutrophil cytoplasmic antibodies negative,Liv/kia type 1 negative. Serum C3 and C4 were negative. I don't see his muscle enzymes specifically. He was referred back to his local rheumatologist for management of his known rheumatoid arthritis.the patient states he still feels weak and  fatigued. He states he has numbness in both feet and apparently is known to have neuropathy 05/20/17; the patient continues to have a difficult problem on the right lateral malleolus and the right lateral [name]. Both of these wounds have no viable surface even with attempts at debridement. He has a new wound on the left plantar metatarsal head which looks more like a superficial diabetic pressure related injury then part of this underlying issue he has. Most of the rest of the wounds on his legs look satisfactory. Mostly on the right calf.reviewed his arterial studies which showed noncompressible vessels bilaterally but the TBIs were quite normal. 05/27/17; no real improvement in the right lateral malleolus and right lateral [name]. In fact the right lateral [name] is now on bone. I gave him doxycycline empirically last week it appears that he developed photosensitivity was 40 the son in a tractor. I'll not give him any more of this. This is predominantly on his face and dorsal forearms and hands. I given this more as an anti- inflammatory. I'm going to have him seen by vascular surgery. His TBIs that he had done previously ordered by Dr. Brigitte Pulse were in the normal range They never did full arterial studies on him. he now has small punched out wounds on the right lateral ankle and right lateral [name]. I doubt these are ischemic however I would like a review of his macrovascular status. He does describe some pain at night. I'll reduce the compression from 3 layer to 2 layer and I'm not convinced that this is a macrovascular issue however I want to make sure. We've been using silver alginate 06/03/17; the patient's x-rays that I ordered last time of his right ankle and right lateral [name] did not show definite osteomyelitis. We've been using silver alginate. The wounds are not making any progress. The area  on the plantar left first metatarsal head however appears to be better. The patient complains of weakness  that is more of the fatigue. He says if he's walking his head will fall onto his chest and that his legs literally gave out on him. He did see neurology in the past however that was at a time where his workup was for temporal arteritis and headaches. 06/10/17; the patient is making no progress with the areas on the right lateral [name] and right lateral malleolus. In fact the area on the [name] probes to bone. X-ray did not show osteomyelitis. He went and saw vein and vascular on 06/04/17 he was felt to have significant reflux in the left greater saphenous vein over this is not in the area we are most concerned about. He could be offered ablation. He was not felt to have venous reflux noted in the right lower extremity. He was felt to have some degree of lymphedema and he was felt to be a candidate for compression pumps. Finally a diagnostic arteriogram was suggested which is really what I'm most interested in. The patient has wife wanted time to think about this, I think they were confused about interacting between venous and arterial discussions. I think it would be probably well worth going through the angiogram. Culture I did have the deeper area on the right lateral [name] showed a few Enterococcus faecalis. I would like to start her on amoxicillin which they will start today for one-week 500 3 times a day 06/17/17; the patient still has punched out areas on the right lateral [name] and right lateral malleolus. There is not a viable surface here. We have been using Santyl. He also has an area on the plantar aspect of his left first metatarsal head this also seems to be better 06/24/17; patient's angiogram as on 07/06/17; still has punched out areas on the right lateral [name] and right lateral malleolus to which we've been applying Santyl-based dressings. He also has a more superficial area on the left plantar first metatarsal head, using collagen here 07/08/17; the patient had his angiogram. This perineal  artery had a 70-80% stenosis. Similarly the posterior tibial artery also was diseased. Furthermore down the posterior tibial artery in the distal segment was a short stenosis of 80%. His major vessels in his thigh and only minor irregularity. He had percutaneous angioplasty of the proximal right peroneal artery. Also angioplasty of the tibial peroneal trunk and proximal posterior tibial artery as well as the mid to distal segment of the posterior tibial artery. He handled this remarkably well. The patient arrived with a new wound on his right anterior calf which I think is the reopening of one of the original small open areas Electronic Signature(s) ZAYDRIAN, BATTA ([id number]) Signed: 07/10/2017 8:34:07 AM By: Linton Ham MD Entered By: Linton Ham on 07/08/2017 16:10:21 [place], [name] ([id number]) -------------------------------------------------------------------------------- Physical Exam Details Patient Name: [name] [name] Date of Service: 07/08/2017 1:45 PM Medical Record Number: [id number] Patient Account Number: [id number] Date of Birth/Sex: 1940-08-05 (76 y.o. M) Treating RN: Cornell Barman Primary Care Provider: Cyndi Bender Other Clinician: Referring Provider: Cyndi Bender Treating Provider/Extender: Tito Dine in Treatment: 46 Cardiovascular is a palpable dorsalis pedis pulse on the right but I believe he has already had this. Notes wound exam; multiple areas on the posterior and anterior part of the right calf were closed however he has a new open area on the right anterior distal tibia. oArea on the right lateral malleolus  and right lateral [name] are about the same initial appearance. Using a #3 curet I removed tightly adherent necrotic debris although the one on the malleolus looks better after debridement. We are going to continue with Santyl to this area oThe area on his first metatarsal head on the left looks like a pressure area. We've been  using silver collagen Electronic Signature(s) Signed: 07/10/2017 8:34:07 AM By: Linton Ham MD Entered By: Linton Ham on 07/08/2017 16:12:56 [name] [name] ([id number]) -------------------------------------------------------------------------------- Physician Orders Details Patient Name: [name] [name] Date of Service: 07/08/2017 1:45 PM Medical Record Number: [id number] Patient Account Number: [id number] Date of Birth/Sex: 24-Jul-1940 (76 y.o. M) Treating RN: Cornell Barman Primary Care Provider: Cyndi Bender Other Clinician: Referring Provider: Cyndi Bender Treating Provider/Extender: Tito Dine in Treatment: 13 Verbal / Phone Orders: No Diagnosis Coding Wound Cleansing Wound #10 Right,Medial Lower Leg o Clean wound with Normal Saline. Wound #6 Right,Lateral Malleolus o Clean wound with Normal Saline. Wound #8 Right,Lateral [name] o Clean wound with Normal Saline. Wound #9 Left Metatarsal head first o Clean wound with Normal Saline. Anesthetic (add to Medication List) Wound #10 Right,Medial Lower Leg o Topical Lidocaine 4% cream applied to wound bed prior to debridement (In Clinic Only). Wound #6 Right,Lateral Malleolus o Topical Lidocaine 4% cream applied to wound bed prior to debridement (In Clinic Only). Wound #8 Right,Lateral [name] o Topical Lidocaine 4% cream applied to wound bed prior to debridement (In Clinic Only). Wound #9 Left Metatarsal head first o Topical Lidocaine 4% cream applied to wound bed prior to debridement (In Clinic Only). Primary Wound Dressing Wound #10 Right,Medial Lower Leg o Santyl Ointment Wound #6 Right,Lateral Malleolus o Santyl Ointment Wound #8 Right,Lateral [name] o Santyl Ointment Wound #9 Left Metatarsal head first o Silver Collagen Secondary Dressing Wound #10 Right,Medial Lower Leg o ABD pad Wound #6 Right,Lateral Malleolus [place], [place] E. ([id number]) o ABD pad Wound #8  Right,Lateral [name] o ABD pad Wound #9 Left Metatarsal head first o Boardered Foam Dressing Dressing Change Frequency Wound #10 Right,Medial Lower Leg o Change Dressing Monday, Wednesday, Friday Wound #6 Right,Lateral Malleolus o Change Dressing Monday, Wednesday, Friday Wound #8 Right,Lateral [name] o Change Dressing Monday, Wednesday, Friday Wound #9 Left Metatarsal head first o Change Dressing Monday, Wednesday, Friday Follow-up Appointments Wound #10 Right,Medial Lower Leg o Return Appointment in 2 weeks. Wound #6 Right,Lateral Malleolus o Return Appointment in 2 weeks. Wound #8 Right,Lateral [name] o Return Appointment in 2 weeks. Wound #9 Left Metatarsal head first o Return Appointment in 2 weeks. Edema Control Wound #10 Right,Medial Lower Leg o Kerlix and Coban - Right Lower Extremity o Elevate legs to the level of the heart and pump ankles as often as possible Wound #6 Right,Lateral Malleolus o Kerlix and Coban - Right Lower Extremity o Elevate legs to the level of the heart and pump ankles as often as possible Wound #8 Right,Lateral [name] o Kerlix and Coban - Right Lower Extremity o Elevate legs to the level of the heart and pump ankles as often as possible Home Health Wound #10 Right,Medial Lower Leg o [place] Visits o Home Health Nurse may visit PRN to address patientos wound care needs. o FACE TO FACE ENCOUNTER: MEDICARE and MEDICAID PATIENTS: I certify that this patient is under my care and that I had a face-to-face encounter that meets the physician face-to-face encounter requirements with this patient on this date. The encounter with the patient was in whole or in part for the following  MEDICAL CONDITION: (primary reason for Home Healthcare) MEDICAL NECESSITY: I certify, that based on my findings, NURSING services are a medically necessary home health service. HOME BOUND STATUS: I certify that my [name], DERRAL [name]  ([id number]) clinical findings support that this patient is homebound (i.e., Due to illness or injury, pt requires aid of supportive devices such as crutches, cane, wheelchairs, walkers, the use of special transportation or the assistance of another person to leave their place of residence. There is a normal inability to leave the home and doing so requires considerable and taxing effort. Other absences are for medical reasons / religious services and are infrequent or of short duration when for other reasons). o If current dressing causes regression in wound condition, may [name]/C ordered dressing product/s and apply Normal Saline Moist Dressing daily until next [place] / Other MD appointment. [place] of regression in wound condition at [phone]. o Please direct any NON-WOUND related issues/requests for orders to patient's Primary Care Physician Wound #6 [place] Nurse may visit PRN to address patientos wound care needs. o FACE TO FACE ENCOUNTER: MEDICARE and MEDICAID PATIENTS: I certify that this patient is under my care and that I had a face-to-face encounter that meets the physician face-to-face encounter requirements with this patient on this date. The encounter with the patient was in whole or in part for the following MEDICAL CONDITION: (primary reason for [place]) MEDICAL NECESSITY: I certify, that based on my findings, NURSING services are a medically necessary home health service. HOME BOUND STATUS: I certify that my clinical findings support that this patient is homebound (i.e., Due to illness or injury, pt requires aid of supportive devices such as crutches, cane, wheelchairs, walkers, the use of special transportation or the assistance of another person to leave their place of residence. There is a normal inability to leave the home and doing so requires considerable and  taxing effort. Other absences are for medical reasons / religious services and are infrequent or of short duration when for other reasons). o If current dressing causes regression in wound condition, may [name]/C ordered dressing product/s and apply Normal Saline Moist Dressing daily until next [place] / Other MD appointment. [place] of regression in wound condition at [phone]. o Please direct any NON-WOUND related issues/requests for orders to patient's Primary Care Physician Wound #8 [place] Nurse may visit PRN to address patientos wound care needs. o FACE TO FACE ENCOUNTER: MEDICARE and MEDICAID PATIENTS: I certify that this patient is under my care and that I had a face-to-face encounter that meets the physician face-to-face encounter requirements with this patient on this date. The encounter with the patient was in whole or in part for the following MEDICAL CONDITION: (primary reason for [place]) MEDICAL NECESSITY: I certify, that based on my findings, NURSING services are a medically necessary home health service. HOME BOUND STATUS: I certify that my clinical findings support that this patient is homebound (i.e., Due to illness or injury, pt requires aid of supportive devices such as crutches, cane, wheelchairs, walkers, the use of special transportation or the assistance of another person to leave their place of residence. There is a normal inability to leave the home and doing so requires considerable and taxing effort. Other absences are for medical reasons / religious services and are infrequent or of short duration when for other reasons).   o If current dressing causes regression in wound condition, may [name]/C ordered dressing product/s and apply Normal Saline Moist Dressing daily until next [place] / Other MD appointment. [place] of regression in  wound condition at [phone]. o Please direct any NON-WOUND related issues/requests for orders to patient's Primary Care Physician Wound #9 Left Metatarsal head first o [place] Nurse may visit PRN to address patientos wound care needs. o FACE TO FACE ENCOUNTER: MEDICARE and MEDICAID PATIENTS: I certify that this patient is under my care and that I had a face-to-face encounter that meets the physician face-to-face encounter requirements with this patient on this date. The encounter with the patient was in whole or in part for the following MEDICAL CONDITION: (primary reason for [place]) MEDICAL NECESSITY: I certify, that based on my findings, NURSING services are a medically necessary home health service. HOME BOUND STATUS: I certify that my clinical findings support that this patient is homebound (i.e., Due to illness or injury, pt requires aid of supportive devices such as crutches, cane, wheelchairs, walkers, the use of special transportation or the assistance of another person to leave their place of residence. There is a normal inability to leave the home and doing so requires considerable and taxing effort. Other absences are for medical reasons / religious services and are infrequent or of short duration when for other reasons). MCKALE, HAFFEY ([id number]) o If current dressing causes regression in wound condition, may [name]/C ordered dressing product/s and apply Normal Saline Moist Dressing daily until next [place] / Other MD appointment. [place] of regression in wound condition at [phone]. o Please direct any NON-WOUND related issues/requests for orders to patient's Primary Care Physician Electronic Signature(s) Signed: 07/08/2017 4:43:24 PM By: Gretta Cool, BSN, RN, CWS, Kim RN, BSN Signed: 07/10/2017 8:34:07 AM By: Linton Ham MD Entered By: Gretta Cool, BSN, RN, CWS, Kim on 07/08/2017  14:31:02 [place], [name] ([id number]) -------------------------------------------------------------------------------- Problem List Details Patient Name: [name] [name] Date of Service: 07/08/2017 1:45 PM Medical Record Number: [id number] Patient Account Number: [id number] Date of Birth/Sex: [birth year] (76 y.o. 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 limited 04/08/2017 Yes Yes to breakdown of skin Status Complications Interventions Medical No still a very nonviable wound surface [name] [name] 10 Decision change continue Santyl Making : I25.5 Ischemic cardiomyopathy 04/08/2017 No Yes 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 ([id number]) peroneal artery and the tibial peroneal trunk and proximal posterior  tibial artery  Inactive Problems Resolved Problems Electronic Signature(s) Signed: 07/10/2017 8:34:07 AM By: Linton Ham MD Entered By: Linton Ham on 07/08/2017 16:06:40 [place], [name] ([id number]) -------------------------------------------------------------------------------- Progress Note Details Patient Name: [name] [name] Date of Service: 07/08/2017 1:45 PM Medical Record Number: [id number] Patient Account Number: [id number] Date of Birth/Sex: [birth year] (76 y.o. M) Treating RN: Cornell Barman Primary Care Provider: Cyndi Bender Other Clinician: Referring Provider: Cyndi Bender Treating Provider/Extender: Tito Dine in Treatment: 13 Subjective History of Present Illness (HPI) 04/08/17; this is a complex 77 year old man referred here from [facility]. He had been referred there for bilateral lower extremity edema with ulcer formation predominantly on the right calf but also the right [name]. He had been receiving Unna boots bilaterally. The history here is long. He is not a diabetic however ICU looking through late 2018 he was worked up for chronic headaches, elevated inflammatory markers including C-reactive protein and ESR. He went on to actually have a left temporal artery biopsy wasn't that was negative.he received about 6 weeks of high-dose prednisone 60 mg with improvement in his inflammatory markers. He was admitted to hospital in late November with ventricular tachycardia syncope. He has known ischemic cardiomyopathy. He was admitted in the hospital in mid December. Apparently this was precipitated by a syncopal spell falling out of his scooter while at [place]. There was ventricular arrhythmia. He has an implantable defibrillator and echocardiogram showed severe LV dysfunction with an EF of 20% and valvular regurgitations including mild AR, moderate MR. There was no stenosis. He ruled in for a non-ST elevation MI in the setting of V.  tach.his wife states that sometime during this hospitalization she noted multiple areas of skin change on the right lower calf which became evident just after he left the hospital. He was back in hospital in February with acute renal failure hyponatremia. This responded to fluid resuscitation.. Interestingly I can't see much description of his right leg at that point in time.he was followed by Dr. Nehemiah Massed of dermatology for the necrotic wounds on his right leg. Apparently a biopsy was planned at one point but not done although in some notes that suggests it was. I cannot see these results area He was noted to have a lot of edema. Was treated with bilateral Unna boots edges really helped with the swelling they have been using Bactroban to small open areas predominantly on the right anterior lower leg His history is complicated by the fact that he has rheumatoid arthritis followed by rheumatology. He is followed by neurology for disabling headaches. At one point this was felt to be giant cell arteritis although a left temporal biopsy was apparently negative. He was given a prolonged course of prednisone at 60 mg which managed his sedimentation rates but apparently did not prove improve the headaches. This is been tapered to off on by rheumatology on 03/13/17 Vascular had plans to do a venous reflux workup as well as arterial studies in May. They also wanted to get him a lymphedema pump. As mentioned he's been using bacitracin under Unna boot wraps to both lower legs 04/15/17; the patient arrives with most of his wounds improved. These are small punched out wounds. Most of them remaining ones are on the right anterior calf with the most problematic over the right lateral malleolus. There are no new areas. The symptom complex or potential symptom complex we are dealing with his chronic disabling headaches with inflammatory markers not responsive to prednisone and with a negative temporal  biopsy, lower  extremity weakness, skin ulcerations just on the right leg. We have managed to get his arterial studies moved up to April 30. He has a rheumatology consult at [facility] in June. He has seen dermatology locally, rheumatology locally, neurology locally. 04/22/17; small punched out areas on the right leg anteriorly posteriorly. Most of these appear to have closed over. Some of them have eschar over the surface. The most problematic area appears to be over the right lateral malleolus. We've been using prisma to all of this. He has arterial studies on April 30 and a rheumatology consult at [facility] on June 28 04/29/17; most of the small punched out areas on the right leg posteriorly are closed. He has 2 or 3 openings anteriorly but most of these appear to be on the way to closing. Still problematically over the right lateral malleolus and right lateral [name] with almost ischemic-looking eschar. His arterial studies that I ordered are due to be done next week on the 30th so we should have them available for our next visit hopefully. He also saw a rheumatologist at [facility] and according to the patient he did 8 vials of blood. Finally he has scaling rash on his left [name] and what looks to be at [facility] area on the left anterior leg 05/06/17; most of the small punched out areas on the right leg posteriorly and anteriorly are closed. He still has one small one [place], Mahamed E. ([id number]) anteriorly one over the right lateral malleolus and one over the right lateral [name]. He had his arterial studies they didn't seem to do waveform analysis not exactly sure why however in any case is ABIs were noncompressible bilaterally. They did provide TBIs although looking at the pressures it appears that his TBIs are quite normal. I therefore went ahead and debrided the area over the right lateral malleolus and the right lateral [name] 05/13/17; most of the small punched out areas on the right leg posteriorly and anteriorly are closed. He  continues to have problematic areas over the right lateral malleolus and the right lateral [name]. He still requiring aggressive debridement of these 2 wounds using silver collagen I reviewed the note from rheumatology I don't think they came up with a specific diagnosis although he is known to have seropositive RA. They did a panel of lab work when I was able to see his his AMA was negative, anti-smooth muscle antibodies negative antineutrophil cytoplasmic antibodies negative,Liv/kia type 1 negative. Serum C3 and C4 were negative. I don't see his muscle enzymes specifically. He was referred back to his local rheumatologist for management of his known rheumatoid arthritis.the patient states he still feels weak and fatigued. He states he has numbness in both feet and apparently is known to have neuropathy 05/20/17; the patient continues to have a difficult problem on the right lateral malleolus and the right lateral [name]. Both of these wounds have no viable surface even with attempts at debridement. He has a new wound on the left plantar metatarsal head which looks more like a superficial diabetic pressure related injury then part of this underlying issue he has. Most of the rest of the wounds on his legs look satisfactory. Mostly on the right calf.reviewed his arterial studies which showed noncompressible vessels bilaterally but the TBIs were quite normal. 05/27/17; no real improvement in the right lateral malleolus and right lateral [name]. In fact the right lateral [name] is now on bone. I gave him doxycycline empirically last week it appears that he developed  photosensitivity was 10 the son in a tractor. I'll not give him any more of this. This is predominantly on his face and dorsal forearms and hands. I given this more as an anti- inflammatory. I'm going to have him seen by vascular surgery. His TBIs that he had done previously ordered by Dr. Brigitte Pulse were in the normal range They never did full arterial  studies on him. he now has small punched out wounds on the right lateral ankle and right lateral [name]. I doubt these are ischemic however I would like a review of his macrovascular status. He does describe some pain at night. I'll reduce the compression from 3 layer to 2 layer and I'm not convinced that this is a macrovascular issue however I want to make sure. We've been using silver alginate 06/03/17; the patient's x-rays that I ordered last time of his right ankle and right lateral [name] did not show definite osteomyelitis. We've been using silver alginate. The wounds are not making any progress. The area on the plantar left first metatarsal head however appears to be better. The patient complains of weakness that is more of the fatigue. He says if he's walking his head will fall onto his chest and that his legs literally gave out on him. He did see neurology in the past however that was at a time where his workup was for temporal arteritis and headaches. 06/10/17; the patient is making no progress with the areas on the right lateral [name] and right lateral malleolus. In fact the area on the [name] probes to bone. X-ray did not show osteomyelitis. He went and saw vein and vascular on 06/04/17 he was felt to have significant reflux in the left greater saphenous vein over this is not in the area we are most concerned about. He could be offered ablation. He was not felt to have venous reflux noted in the right lower extremity. He was felt to have some degree of lymphedema and he was felt to be a candidate for compression pumps. Finally a diagnostic arteriogram was suggested which is really what I'm most interested in. The patient has wife wanted time to think about this, I think they were confused about interacting between venous and arterial discussions. I think it would be probably well worth going through the angiogram. Culture I did have the deeper area on the right lateral [name] showed a few  Enterococcus faecalis. I would like to start her on amoxicillin which they will start today for one-week 500 3 times a day 06/17/17; the patient still has punched out areas on the right lateral [name] and right lateral malleolus. There is not a viable surface here. We have been using Santyl. He also has an area on the plantar aspect of his left first metatarsal head this also seems to be better 06/24/17; patient's angiogram as on 07/06/17; still has punched out areas on the right lateral [name] and right lateral malleolus to which we've been applying Santyl-based dressings. He also has a more superficial area on the left plantar first metatarsal head, using collagen here 07/08/17; the patient had his angiogram. This perineal artery had a 70-80% stenosis. Similarly the posterior tibial artery also was diseased. Furthermore down the posterior tibial artery in the distal segment was a short stenosis of 80%. His major vessels in his thigh and only minor irregularity. He had percutaneous angioplasty of the proximal right peroneal artery. Also angioplasty of the tibial peroneal trunk and proximal posterior tibial artery as well as the  mid to distal segment of the posterior tibial artery. He handled this remarkably well. The patient arrived with a new wound on his right anterior calf which I think is the reopening of one of the original small open areas [name], Kaian E. ([id number]) Objective Constitutional Vitals Time Taken: 2:00 PM, Height: 68 in, Temperature: 97.7 F, Pulse: 63 bpm, Respiratory Rate: 18 breaths/min, Blood Pressure: 95/47 mmHg. Cardiovascular is a palpable dorsalis pedis pulse on the right but I believe he has already had this. General Notes: wound exam; multiple areas on the posterior and anterior part of the right calf were closed however he has a new open area on the right anterior distal tibia. Area on the right lateral malleolus and right lateral [name] are about the same initial  appearance. Using a #3 curet I removed tightly adherent necrotic debris although the one on the malleolus looks better after debridement. We are going to continue with Santyl to this area The area on his first metatarsal head on the left looks like a pressure area. We've been using silver collagen Integumentary (Hair, Skin) Wound #10 status is Open. Original cause of wound was Gradually Appeared. The wound is located on the Right,Medial Lower Leg. The wound measures 0.5cm length x 0.4cm width x 0.32cm depth; 0.157cm^2 area and 0.05cm^3 volume. There is Fat Layer (Subcutaneous Tissue) Exposed exposed. There is no tunneling or undermining noted. There is a medium amount of serous drainage noted. The wound margin is distinct with the outline attached to the wound base. There is no granulation within the wound bed. There is a large (67-100%) amount of necrotic tissue within the wound bed including Adherent Slough. The periwound skin appearance did not exhibit: Callus, Crepitus, Excoriation, Induration, Rash, Scarring, Dry/Scaly, Maceration, Atrophie Blanche, Cyanosis, Ecchymosis, Hemosiderin Staining, Mottled, Pallor, Rubor, Erythema. Periwound temperature was noted as No Abnormality. The periwound has tenderness on palpation. Wound #6 status is Open. Original cause of wound was Gradually Appeared. The wound is located on the Right,Lateral Malleolus. The wound measures 0.7cm length x 0.5cm width x 0.3cm depth; 0.275cm^2 area and 0.082cm^3 volume. There is no tunneling noted, however, there is undermining starting at 12:00 and ending at 5:00 with a maximum distance of 0.3cm. There is a large amount of serous drainage noted. The wound margin is distinct with the outline attached to the wound base. There is medium (34-66%) red, pink granulation within the wound bed. There is a medium (34-66%) amount of necrotic tissue within the wound bed including Adherent Slough. The periwound skin appearance exhibited:  Callus. The periwound skin appearance did not exhibit: Crepitus, Excoriation, Induration, Rash, Scarring, Dry/Scaly, Maceration, Atrophie Blanche, Cyanosis, Ecchymosis, Hemosiderin Staining, Mottled, Pallor, Rubor, Erythema. Periwound temperature was noted as No Abnormality. The periwound has tenderness on palpation. Wound #8 status is Open. Original cause of wound was Gradually Appeared. The wound is located on the Right,Lateral [name]. The wound measures 0.8cm length x 0.8cm width x 0.4cm depth; 0.503cm^2 area and 0.201cm^3 volume. There is bone and Fat Layer (Subcutaneous Tissue) Exposed exposed. There is no tunneling noted, however, there is undermining starting at 12:00 and ending at 12:00 with a maximum distance of 0.3cm. There is a large amount of serous drainage noted. The wound margin is distinct with the outline attached to the wound base. There is no granulation within the wound bed. There is a large (67-100%) amount of necrotic tissue within the wound bed including Adherent Slough. The periwound skin appearance exhibited: Callus, Maceration. Periwound temperature was noted as  No Abnormality. The periwound has tenderness on palpation. Wound #9 status is Open. Original cause of wound was Gradually Appeared. The wound is located on the Left Metatarsal head first. The wound measures 0.5cm length x 0.5cm width x 0.1cm depth; 0.196cm^2 area and 0.02cm^3 volume. There is Fat Layer (Subcutaneous Tissue) Exposed exposed. There is no tunneling or undermining noted. There is a medium amount of serous drainage noted. The wound margin is flat and intact. There is no granulation within the wound bed. There is a large (67-100%) amount of necrotic tissue within the wound bed including Adherent Slough. The periwound skin appearance exhibited: Callus, Maceration. The periwound skin appearance did not exhibit: Crepitus, Excoriation, Induration, Rash, [name], Cheo E. ([id number]) Scarring, Dry/Scaly,  Atrophie Blanche, Cyanosis, Ecchymosis, Hemosiderin Staining, Mottled, Pallor, Rubor, Erythema. Periwound temperature was noted as No Abnormality. 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 There was a Excisional Skin/Subcutaneous Tissue Debridement with a total area of 0.35 sq cm performed by Ricard Dillon, MD. With the following instrument(s): Curette to remove Viable and Non-Viable tissue/material. Material removed includes Subcutaneous Tissue and Slough and after achieving pain control using Other (lidocaine 4%). No specimens were taken. A time out was conducted at 14:22, prior to the start of the procedure. A Minimum amount of bleeding was controlled with Pressure. The procedure was tolerated well. Patient s Level of Consciousness post procedure was recorded as Awake and Alert. Post Debridement Measurements: 0.7cm length x 0.5cm width x 0.3cm depth; 0.082cm^3 volume. Character of Wound/Ulcer Post Debridement is stable. Severity of Tissue Post Debridement is: Fat layer exposed. Post procedure Diagnosis Wound #6: Same as Pre-Procedure Wound #8 Pre-procedure diagnosis of Wound #8 is an Arterial Insufficiency Ulcer located on the Right,Lateral [name] .Severity of Tissue Pre Debridement is: Fat layer exposed. There was a Excisional Skin/Subcutaneous Tissue Debridement with a total area of 0.64 sq cm performed by Ricard Dillon, MD. With the following instrument(s):  Curette to remove Viable and Non- Viable tissue/material. Material removed includes Subcutaneous Tissue and Slough and after achieving pain control using Other (lidocaine 4%). No specimens were taken. A time out was conducted at 14:22, prior to the start of the procedure. A Minimum amount of bleeding was controlled with Pressure. The procedure was tolerated well. Patient s Level of Consciousness post procedure was recorded as Awake and Alert. Post Debridement Measurements: 0.8cm length x 0.9cm width x 0.5cm depth; 0.283cm^3 volume. Character of Wound/Ulcer Post Debridement is stable. Severity of Tissue Post Debridement is: Fat layer exposed. Post procedure Diagnosis Wound #8: Same as Pre-Procedure Plan Wound Cleansing: Wound #10 Right,Medial Lower Leg: [name], Adrion E. ([id number]) Clean wound with Normal Saline. Wound #6 Right,Lateral Malleolus: Clean wound with Normal Saline. Wound #8 Right,Lateral [name]: Clean wound with Normal Saline. Wound #9 Left Metatarsal head first: Clean wound with Normal Saline. Anesthetic (add to Medication List): Wound #10 Right,Medial Lower Leg: Topical Lidocaine 4% cream applied to wound bed prior to debridement (In Clinic Only). Wound #6 Right,Lateral Malleolus: Topical Lidocaine 4% cream applied to wound bed prior  to debridement (In Clinic Only). Wound #8 Right,Lateral [name]: Topical Lidocaine 4% cream applied to wound bed prior to debridement (In Clinic Only). Wound #9 Left Metatarsal head first: Topical Lidocaine 4% cream applied to wound bed prior to debridement (In Clinic Only). Primary Wound Dressing: Wound #10 Right,Medial Lower Leg: Santyl Ointment Wound #6 Right,Lateral Malleolus: Santyl Ointment Wound #8 Right,Lateral [name]: Santyl Ointment Wound #9 Left Metatarsal head first: Silver Collagen Secondary Dressing: Wound #10 Right,Medial Lower Leg: ABD pad Wound #6 Right,Lateral Malleolus: ABD pad Wound #8 Right,Lateral [name]: ABD  pad Wound #9 Left Metatarsal head first: Boardered Foam Dressing Dressing Change Frequency: Wound #10 Right,Medial Lower Leg: Change Dressing Monday, Wednesday, Friday Wound #6 Right,Lateral Malleolus: Change Dressing Monday, Wednesday, Friday Wound #8 Right,Lateral [name]: Change Dressing Monday, Wednesday, Friday Wound #9 Left Metatarsal head first: Change Dressing Monday, Wednesday, Friday Follow-up Appointments: Wound #10 Right,Medial Lower Leg: Return Appointment in 2 weeks. Wound #6 Right,Lateral Malleolus: Return Appointment in 2 weeks. Wound #8 Right,Lateral [name]: Return Appointment in 2 weeks. Wound #9 Left Metatarsal head first: Return Appointment in 2 weeks. Edema Control: Wound #10 Right,Medial Lower Leg: Kerlix and Coban - Right Lower Extremity Elevate legs to the level of the heart and pump ankles as often as possible Wound #6 Right,Lateral Malleolus: Kerlix and Coban - Right Lower Extremity Elevate legs to the level of the heart and pump ankles as often as possible Wound #8 Right,Lateral [name]: Indelicato, Katie E. ([id number]) Kerlix and Coban - Right Lower Extremity Elevate legs to the level of the heart and pump ankles as often as possible Home Health: Wound #10 Right,Medial Lower Leg: [place] Nurse may visit PRN to address patient s wound care needs. FACE TO FACE ENCOUNTER: MEDICARE and MEDICAID PATIENTS: I certify that this patient is under my care and that I had a face-to-face encounter that meets the physician face-to-face encounter requirements with this patient on this date. The encounter with the patient was in whole or in part for the following MEDICAL CONDITION: (primary reason for [place]) MEDICAL NECESSITY: I certify, that based on my findings, NURSING services are a medically necessary home health service. HOME BOUND STATUS: I certify that my clinical findings support that this patient is homebound (i.e., Due  to illness or injury, pt requires aid of supportive devices such as crutches, cane, wheelchairs, walkers, the use of special transportation or the assistance of another person to leave their place of residence. There is a normal inability to leave the home and doing so requires considerable and taxing effort. Other absences are for medical reasons / religious services and are infrequent or of short duration when for other reasons). If current dressing causes regression in wound condition, may [name]/C ordered dressing product/s and apply Normal Saline Moist Dressing daily until next [place] / Other MD appointment. [place] of regression in wound condition at [phone]. Please direct any NON-WOUND related issues/requests for orders to patient's Primary Care Physician Wound #6 Right,Lateral Malleolus: [place] Nurse may visit PRN to address patient s wound care needs. FACE TO FACE ENCOUNTER: MEDICARE and MEDICAID PATIENTS: I certify that this patient is under my care and that I had a face-to-face encounter that meets the physician face-to-face encounter requirements with this patient on this date. The encounter with the patient was in whole or in part for the following MEDICAL CONDITION: (primary reason for [place]) MEDICAL NECESSITY: I certify, that based on my  findings, NURSING services are a medically necessary home health service. HOME BOUND STATUS: I certify that my clinical findings support that this patient is homebound (i.e., Due to illness or injury, pt requires aid of supportive devices such as crutches, cane, wheelchairs, walkers, the use of special transportation or the assistance of another person to leave their place of residence. There is a normal inability to leave the home and doing so requires considerable and taxing effort. Other absences are for medical reasons / religious services and are infrequent or of  short duration when for other reasons). If current dressing causes regression in wound condition, may [name]/C ordered dressing product/s and apply Normal Saline Moist Dressing daily until next [place] / Other MD appointment. [place] of regression in wound condition at [phone]. Please direct any NON-WOUND related issues/requests for orders to patient's Primary Care Physician Wound #8 Right,Lateral [name]: [place] Nurse may visit PRN to address patient s wound care needs. FACE TO FACE ENCOUNTER: MEDICARE and MEDICAID PATIENTS: I certify that this patient is under my care and that I had a face-to-face encounter that meets the physician face-to-face encounter requirements with this patient on this date. The encounter with the patient was in whole or in part for the following MEDICAL CONDITION: (primary reason for [place]) MEDICAL NECESSITY: I certify, that based on my findings, NURSING services are a medically necessary home health service. HOME BOUND STATUS: I certify that my clinical findings support that this patient is homebound (i.e., Due to illness or injury, pt requires aid of supportive devices such as crutches, cane, wheelchairs, walkers, the use of special transportation or the assistance of another person to leave their place of residence. There is a normal inability to leave the home and doing so requires considerable and taxing effort. Other absences are for medical reasons / religious services and are infrequent or of short duration when for other reasons). If current dressing causes regression in wound condition, may [name]/C ordered dressing product/s and apply Normal Saline Moist Dressing daily until next [place] / Other MD appointment. [place] of regression in wound condition at [phone]. Please direct any NON-WOUND related issues/requests for orders to patient's Primary Care  Physician Wound #9 Left Metatarsal head first: [place] Nurse may visit PRN to address patient s wound care needs. FACE TO FACE ENCOUNTER: MEDICARE and MEDICAID PATIENTS: I certify that this patient is under my care and that I had a face-to-face encounter that meets the physician face-to-face encounter requirements with this patient on this date. The encounter with the patient was in whole or in part for the following MEDICAL CONDITION: (primary reason for [place]) MEDICAL NECESSITY: I certify, that based on my findings, NURSING services are a medically necessary home health service. HOME BOUND STATUS: I certify that my clinical findings support that this patient is homebound (i.e., Due to illness or injury, pt requires aid of supportive devices such as crutches, cane, wheelchairs, walkers, the use of special [place], Romell E. ([id number]) transportation or the assistance of another person to leave their place of residence. There is a normal inability to leave the home and doing so requires considerable and taxing effort. Other absences are for medical reasons / religious services and are infrequent or of short duration when for other reasons). If current dressing causes regression in wound condition, may [name]/C ordered dressing product/s and apply Normal Saline Moist Dressing daily until  next [place] / Other MD appointment. [place] of regression in wound condition at [phone]. 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 he would not agree to an offloading healing sandal Electronic Signature(s) Signed: 07/10/2017 8:34:07 AM By: Linton Ham MD Entered By: Linton Ham on 07/08/2017 16:13:47 Crumm, [name] ([id number]) -------------------------------------------------------------------------------- SuperBill Details Patient Name: [name] [name] Date of Service: 07/08/2017 Medical Record Number: [id number] Patient Account Number: [id number] Date of Birth/Sex: Oct 20, 1940 (76 y.o. 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

## 2017-07-13 DIAGNOSIS — I5023 Acute on chronic systolic (congestive) heart failure: Secondary | ICD-10-CM | POA: Diagnosis not present

## 2017-07-13 DIAGNOSIS — L97311 Non-pressure chronic ulcer of right ankle limited to breakdown of skin: Secondary | ICD-10-CM | POA: Diagnosis not present

## 2017-07-13 DIAGNOSIS — I110 Hypertensive heart disease with heart failure: Secondary | ICD-10-CM | POA: Diagnosis not present

## 2017-07-13 DIAGNOSIS — L97511 Non-pressure chronic ulcer of other part of right foot limited to breakdown of skin: Secondary | ICD-10-CM | POA: Diagnosis not present

## 2017-07-13 DIAGNOSIS — I2510 Atherosclerotic heart disease of native coronary artery without angina pectoris: Secondary | ICD-10-CM | POA: Diagnosis not present

## 2017-07-13 DIAGNOSIS — I255 Ischemic cardiomyopathy: Secondary | ICD-10-CM | POA: Diagnosis not present

## 2017-07-15 ENCOUNTER — Encounter: Payer: Medicare Other | Admitting: Internal Medicine

## 2017-07-15 DIAGNOSIS — L97511 Non-pressure chronic ulcer of other part of right foot limited to breakdown of skin: Secondary | ICD-10-CM | POA: Diagnosis not present

## 2017-07-15 DIAGNOSIS — I255 Ischemic cardiomyopathy: Secondary | ICD-10-CM | POA: Diagnosis not present

## 2017-07-15 DIAGNOSIS — S51811A Laceration without foreign body of right forearm, initial encounter: Secondary | ICD-10-CM | POA: Diagnosis not present

## 2017-07-15 DIAGNOSIS — I872 Venous insufficiency (chronic) (peripheral): Secondary | ICD-10-CM | POA: Diagnosis not present

## 2017-07-15 DIAGNOSIS — L97812 Non-pressure chronic ulcer of other part of right lower leg with fat layer exposed: Secondary | ICD-10-CM | POA: Diagnosis not present

## 2017-07-15 DIAGNOSIS — I70235 Atherosclerosis of native arteries of right leg with ulceration of other part of foot: Secondary | ICD-10-CM | POA: Diagnosis not present

## 2017-07-15 DIAGNOSIS — S41111A Laceration without foreign body of right upper arm, initial encounter: Secondary | ICD-10-CM | POA: Diagnosis not present

## 2017-07-15 DIAGNOSIS — L97512 Non-pressure chronic ulcer of other part of right foot with fat layer exposed: Secondary | ICD-10-CM | POA: Diagnosis not present

## 2017-07-16 NOTE — Progress Notes (Signed)
[name] ([id number]) Visit Report for 07/15/2017 Arrival Information Details Patient Name: [name], [name] Date of Service: 07/15/2017 8:30 AM Medical Record Number: [id number] Patient Account Number: [id number] Date of Birth/Sex: 27-Mar-1940 (76 y.o. M) Treating RN: Montey Hora Primary Care Avelynn Sellin: Cyndi Bender Other Clinician: Referring Cameren Odwyer: Cyndi Bender Treating Wilmer Santillo/Extender: Tito Dine in Treatment: 14 Visit Information History Since Last Visit Added or deleted any medications: No Patient Arrived: Cane Any new allergies or adverse reactions: No Arrival Time: 08:29 Had a fall or experienced change in No Accompanied By: spouse activities of daily living that may affect Transfer Assistance: None risk of falls: Patient Identification Verified: Yes Signs or symptoms of abuse/neglect since last visito No Secondary Verification Process Completed: Yes Hospitalized since last visit: No Patient Requires Transmission-Based No Implantable device outside of the clinic excluding No Precautions: cellular tissue based products placed in the center Patient Has Alerts: Yes since last visit: Patient Alerts: R ABI >220 Has Dressing in Place as Prescribed: Yes L ABI Has Compression in Place as Prescribed: Yes >220 Pain Present Now: No Electronic Signature(s) Signed: 07/15/2017 5:21:12 PM By: Montey Hora Entered By: Montey Hora on 07/15/2017 08:30:28 Galloway, [name] ([id number]) -------------------------------------------------------------------------------- Encounter Discharge Information Details Patient Name: [name] Date of Service: 07/15/2017 8:30 AM Medical Record Number: [id number] Patient Account Number: [id number] Date of Birth/Sex: [birth year] (76 y.o. M) Treating RN: Montey Hora Primary Care Cherri Yera: Cyndi Bender Other Clinician: Referring Roxana Lai: Cyndi Bender Treating Von Quintanar/Extender: Tito Dine in Treatment: 14 Encounter Discharge Information Items Discharge Condition: Stable Ambulatory Status: Cane Discharge Destination: Home Transportation: Private Auto Accompanied By: spouse Schedule Follow-up Appointment: Yes Clinical Summary of Care: Electronic Signature(s) Signed: 07/15/2017 9:59:37 AM By: Montey Hora Entered By: Montey Hora on 07/15/2017 09:59:37 Pocius, [name] ([id number]) -------------------------------------------------------------------------------- Lower Extremity Assessment Details Patient Name: [name] Date of Service: 07/15/2017 8:30 AM Medical Record Number: [id number] Patient Account Number: [id number] Date of Birth/Sex: [birth year] (76 y.o. M) Treating RN: Montey Hora Primary Care Dene Nazir: Cyndi Bender Other Clinician: Referring Tami Barren: Cyndi Bender Treating Shloka Baldridge/Extender: Tito Dine in Treatment: 14 Edema Assessment Assessed: [Left: No] [Right: No] [Left: Edema] [Right: :] Calf Left: Right: Point of Measurement: 27 cm From Medial Instep cm 28.1 cm Ankle Left: Right: Point of Measurement: 11 cm From Medial Instep cm 23.5 cm Vascular Assessment Pulses: Dorsalis Pedis Palpable: [Left:Yes] [Right:Yes] Posterior Tibial Extremity colors, hair growth, and conditions: Extremity Color: [Left:Normal] [Right:Hyperpigmented] Hair Growth on Extremity: [Left:No] [Right:No] Temperature of Extremity: [Left:Warm] [Right:Warm] Capillary Refill: [Left:< 3 seconds] [Right:< 3 seconds] Toe Nail Assessment Left: Right: Thick: Yes Yes Discolored: Yes Yes Deformed: No No Improper Length and Hygiene: Yes Yes Electronic Signature(s) Signed: 07/15/2017 5:21:12 PM By: Montey Hora Entered By: Montey Hora on 07/15/2017 08:53:12 Cua, [name] ([id number]) -------------------------------------------------------------------------------- Multi Wound Chart Details Patient Name: [name] Date of  Service: 07/15/2017 8:30 AM Medical Record Number: [id number] Patient Account Number: [id number] Date of Birth/Sex: 1940/05/20 (76 y.o. M) Treating RN: Roger Shelter Primary Care Bama Hanselman: Cyndi Bender Other Clinician: Referring Desmin Daleo: Cyndi Bender Treating Takao Lizer/Extender: Tito Dine in Treatment: 14 Vital Signs Height(in): 68 Pulse(bpm): 70 Weight(lbs): Blood Pressure(mmHg): 100/45 Body Mass Index(BMI): Temperature(F): 97.6 Respiratory Rate 16 (breaths/min): Photos: Wound Location: Right Lower Leg - Anterior Right Lower Leg - Posterior Right Hand - 1st Digit Wounding Event: Gradually Appeared Not Known Trauma Primary Etiology: Venous Leg Ulcer Venous Leg Ulcer Skin Tear Comorbid History: Congestive Heart Failure, Congestive Heart  Failure, Congestive Heart Failure, Coronary Artery Disease, Coronary Artery Disease, Coronary Artery Disease, Hypertension, Myocardial Hypertension, Myocardial Hypertension, Myocardial Infarction, Osteoarthritis Infarction, Osteoarthritis Infarction, Osteoarthritis Date Acquired: 07/08/2017 07/15/2017 07/10/2017 Weeks of Treatment: 1 0 0 Wound Status: Open Open Open Measurements L x W x D 0.6x0.5x0.1 1x0.6x0.1 1.2x1x0.1 (cm) Area (cm) : 0.236 0.471 0.942 Volume (cm) : 0.024 0.047 0.094 % Reduction in Area: -50.30% N/A N/A % Reduction in Volume: 52.00% N/A N/A Classification: Full Thickness Without Partial Thickness Full Thickness Without Exposed Support Structures Exposed Support Structures Exudate Amount: Medium Medium Small Exudate Type: Serous Sanguinous Sanguinous Exudate Color: amber red red Wound Margin: Distinct, outline attached Flat and Intact Flat and Intact Granulation Amount: None Present (0%) Large (67-100%) Large (67-100%) Granulation Quality: N/A Pink Pink Necrotic Amount: Large (67-100%) None Present (0%) Small (1-33%) Exposed Structures: Fat Layer (Subcutaneous Fascia: No Fascia: No Tissue) Exposed:  Yes Fat Layer (Subcutaneous Fat Layer (Subcutaneous Fascia: No Tissue) Exposed: No Tissue) Exposed: No Tendon: No Tendon: No Tendon: No Domino, Zackrey E. ([id number]) Muscle: No Muscle: No Muscle: No Joint: No Joint: No Joint: No Bone: No Bone: No Bone: No Epithelialization: None Large (67-100%) Medium (34-66%) Debridement: Debridement - Excisional N/A N/A Pre-procedure 09:00 N/A N/A Verification/Time Out Taken: Pain Control: Other N/A N/A Tissue Debrided: Subcutaneous, Slough N/A N/A Level: Skin/Subcutaneous Tissue N/A N/A Debridement Area (sq cm): 0.3 N/A N/A Instrument: Curette N/A N/A Bleeding: Minimum N/A N/A Hemostasis Achieved: Pressure N/A N/A Procedural Pain: 0 N/A N/A Post Procedural Pain: 0 N/A N/A Debridement Treatment Procedure was tolerated well N/A N/A Response: Post Debridement 0.6x0.5x0.1 N/A N/A Measurements L x W x D (cm) Post Debridement Volume: 0.024 N/A N/A (cm) Periwound Skin Texture: Excoriation: No Excoriation: No Excoriation: No Induration: No Induration: No Induration: No Callus: No Callus: No Callus: No Crepitus: No Crepitus: No Crepitus: No Rash: No Rash: No Rash: No Scarring: No Scarring: No Scarring: No Periwound Skin Moisture: Maceration: No Maceration: No Maceration: No Dry/Scaly: No Dry/Scaly: No Dry/Scaly: No Periwound Skin Color: Atrophie Blanche: No Atrophie Blanche: No Atrophie Blanche: No Cyanosis: No Cyanosis: No Cyanosis: No Ecchymosis: No Ecchymosis: No Ecchymosis: No Erythema: No Erythema: No Erythema: No Hemosiderin Staining: No Hemosiderin Staining: No Hemosiderin Staining: No Mottled: No Mottled: No Mottled: No Pallor: No Pallor: No Pallor: No Rubor: No Rubor: No Rubor: No Temperature: No Abnormality No Abnormality No Abnormality Tenderness on Palpation: Yes Yes Yes Wound Preparation: Ulcer Cleansing: Other: soap Ulcer Cleansing: Other: soap Ulcer Cleansing: and water and water  Rinsed/Irrigated with Saline Topical Anesthetic Applied: Topical Anesthetic Applied: Topical Anesthetic Applied: Other: lidocaine 4% None Other: lidocaine 4% Procedures Performed: Debridement N/A N/A Wound Number: [id number]  Photos: Wound Location: Right Forearm Right Upper Arm Right Malleolus - Lateral Sitzer, TORRION WITTER ([id number]) Wounding Event: Trauma Trauma Gradually Appeared Primary Etiology: Skin Tear Skin Tear Arterial Insufficiency Ulcer Comorbid History: Congestive Heart Failure, Congestive Heart Failure, Congestive Heart Failure, Coronary Artery Disease, Coronary Artery Disease, Coronary Artery Disease, Hypertension, Myocardial Hypertension, Myocardial Hypertension, Myocardial Infarction, Osteoarthritis Infarction, Osteoarthritis Infarction, Osteoarthritis Date Acquired: 07/10/2017 07/10/2017 03/23/2017 Weeks of Treatment: 0 0 14 Wound Status: Open Open Open Measurements L x W x D 1.5x2.1x0.1 1.1x2.3x0.1 0.6x0.5x0.3 (cm) Area (cm) : 2.474 1.987 0.236 Volume (cm) : 0.247 0.199 0.071 % Reduction in Area: N/A N/A -661.30% % Reduction in Volume: N/A N/A -2266.70% Classification: Full Thickness Without Full Thickness Without Full Thickness Without Exposed Support Structures Exposed Support Structures Exposed Support Structures Exudate Amount: Medium Medium Large Exudate  Type: Sanguinous Serous Serous Exudate Color: red amber amber Wound Margin: Flat and Intact Flat and Intact Distinct, outline attached Granulation Amount: Small (1-33%) Medium (34-66%) Medium (34-66%) Granulation Quality: Pink Pink Red, Pink Necrotic Amount: Large (67-100%) Medium (34-66%) Medium (34-66%) Exposed Structures: Fascia: No Fascia: No Fascia: No Fat Layer (Subcutaneous Fat Layer (Subcutaneous Fat Layer (Subcutaneous Tissue) Exposed: No Tissue) Exposed: No Tissue) Exposed: No Tendon: No Tendon: No Tendon: No Muscle: No Muscle: No Muscle: No Joint: No Joint: No Joint: No Bone: No Bone:  No Bone: No Epithelialization: None Small (1-33%) None Debridement: Debridement - Excisional Debridement - Excisional N/A Pre-procedure 09:00 09:00 N/A Verification/Time Out Taken: Pain Control: Other Other N/A Tissue Debrided: Subcutaneous, Slough Subcutaneous, Slough N/A Level: Skin/Subcutaneous Tissue Skin/Subcutaneous Tissue N/A Debridement Area (sq cm): 3.15 2.53 N/A Instrument: Curette Curette N/A Bleeding: Minimum Minimum N/A Hemostasis Achieved: Pressure Pressure N/A Procedural Pain: 0 0 N/A Post Procedural Pain: 0 0 N/A Debridement Treatment Procedure was tolerated well Procedure was tolerated well N/A Response: Post Debridement 1.5x2.1x0.1 1.1x2.3x0.1 N/A Measurements L x W x D (cm) Post Debridement Volume: 0.247 0.199 N/A (cm) Periwound Skin Texture: Excoriation: No Excoriation: No Callus: Yes Induration: No Induration: No Excoriation: No Callus: No Callus: No Induration: No Crepitus: No Crepitus: No Crepitus: No Rash: No Rash: No Rash: No Scarring: No Scarring: No Scarring: No Couse, Nakia E. ([id number]) Periwound Skin Moisture: Maceration: No Maceration: No Maceration: No Dry/Scaly: No Dry/Scaly: No Dry/Scaly: No Periwound Skin Color: Atrophie Blanche: No Atrophie Blanche: No Atrophie Blanche: No Cyanosis: No Cyanosis: No Cyanosis: No Ecchymosis: No Ecchymosis: No Ecchymosis: No Erythema: No Erythema: No Erythema: No Hemosiderin Staining: No Hemosiderin Staining: No Hemosiderin Staining: No Mottled: No Mottled: No Mottled: No Pallor: No Pallor: No Pallor: No Rubor: No Rubor: No Rubor: No Temperature: No Abnormality No Abnormality No Abnormality Tenderness on Palpation: Yes Yes Yes Wound Preparation: Ulcer Cleansing: Ulcer Cleansing: Ulcer Cleansing: Other: soap Rinsed/Irrigated with Saline Rinsed/Irrigated with Saline and water Topical Anesthetic Applied: Topical Anesthetic Applied: Topical Anesthetic Applied: Other:  lidocaine 4% Other: lidocaine 4% Other: lidocaine 4% Procedures Performed: Debridement Debridement N/A Wound Number: [id number] N/A Photos: N/A Wound Location: Right [name] - Lateral Left Metatarsal head first N/A Wounding Event: Gradually Appeared Gradually Appeared N/A Primary Etiology: Arterial Insufficiency Ulcer Pressure Ulcer N/A Comorbid History: Congestive Heart Failure, Congestive Heart Failure, N/A Coronary Artery Disease, Coronary Artery Disease, Hypertension, Myocardial Hypertension, Myocardial Infarction, Osteoarthritis Infarction, Osteoarthritis Date Acquired: 04/29/2017 05/06/2017 N/A Weeks of Treatment: 11 8 N/A Wound Status: Open Open N/A Measurements L x W x D 0.7x0.6x0.4 0.3x0.4x0.1 N/A (cm) Area (cm) : 0.33 0.094 N/A Volume (cm) : 0.132 0.009 N/A % Reduction in Area: -364.80% 52.00% N/A % Reduction in Volume: -1785.70% 55.00% N/A Classification: Full Thickness With Exposed Category/Stage II N/A Support Structures Exudate Amount: Large Medium N/A Exudate Type: Serous Serous N/A Exudate Color: amber amber N/A Wound Margin: Distinct, outline attached Flat and Intact N/A Granulation Amount: None Present (0%) None Present (0%) N/A Granulation Quality: N/A N/A N/A Necrotic Amount: Large (67-100%) Large (67-100%) N/A Exposed Structures: Fat Layer (Subcutaneous Fat Layer (Subcutaneous N/A Tissue) Exposed: Yes Tissue) Exposed: Yes Bone: Yes Fascia: No Fascia: No Tendon: No Nachtigal, Ezeriah E. ([id number]) Tendon: No Muscle: No Muscle: No Joint: No Joint: No Bone: No Epithelialization: None None N/A Debridement: Debridement - Excisional N/A N/A Pre-procedure 09:00 N/A N/A Verification/Time Out Taken: Pain Control: Other N/A N/A Tissue Debrided: Subcutaneous, Slough N/A N/A Level: Skin/Subcutaneous Tissue N/A N/A Debridement Area (  sq cm): 0.42 N/A N/A Instrument: Curette N/A N/A Bleeding: Minimum N/A N/A Hemostasis Achieved: Pressure N/A N/A Procedural Pain: 0  N/A N/A Post Procedural Pain: 0 N/A N/A Debridement Treatment Procedure was tolerated well N/A N/A Response: Post Debridement 0.7x0.6x0.4 N/A N/A Measurements L x W x D (cm) Post Debridement Volume: 0.132 N/A N/A (cm) Periwound Skin Texture: Callus: Yes Callus: Yes N/A Excoriation: No Induration: No Crepitus: No Rash: No Scarring: No Periwound Skin Moisture: Maceration: Yes Maceration: Yes N/A Dry/Scaly: No Periwound Skin Color: No Abnormalities Noted Atrophie Blanche: No N/A Cyanosis: No Ecchymosis: No Erythema: No Hemosiderin Staining: No Mottled: No Pallor: No Rubor: No Temperature: No Abnormality No Abnormality N/A Tenderness on Palpation: Yes No N/A Wound Preparation: Ulcer Cleansing: Other: soap Ulcer Cleansing: N/A and water Rinsed/Irrigated with Saline Topical Anesthetic Applied: Topical Anesthetic Applied: Other: lidocaine 4% Other: lidocaine 4% Procedures Performed: Debridement N/A N/A Treatment Notes Wound #10 (Right, Anterior Lower Leg) 1. Cleansed with: Cleanse wound with antibacterial soap and water 4. Dressing Applied: Prisma Ag Santyl Ointment 5. Secondary Dressing Applied ABD Pad Tapanes, Frank ([id number]) 7. Secured with Other (specify in notes) Notes kerlix/coban wrap Wound #11 (Right, Posterior Lower Leg) 1. Cleansed with: Cleanse wound with antibacterial soap and water 4. Dressing Applied: Prisma Ag Santyl Ointment 5. Secondary Dressing Applied ABD Pad 7. Secured with Other (specify in notes) Notes kerlix/coban wrap Wound #12 (Right Hand - 1st Digit) 1. Cleansed with: Clean wound with Normal Saline 2. Anesthetic Topical Lidocaine 4% cream to wound bed prior to debridement 4. Dressing Applied: Prisma Ag 5. Secondary Dressing Applied Bordered Foam Dressing Wound #13 (Right Forearm) 1. Cleansed with: Clean wound with Normal Saline 2. Anesthetic Topical Lidocaine 4% cream to wound bed prior to debridement 4.  Dressing Applied: Prisma Ag 5. Secondary Dressing Applied Bordered Foam Dressing Wound #14 (Right Upper Arm) 1. Cleansed with: Clean wound with Normal Saline 2. Anesthetic Topical Lidocaine 4% cream to wound bed prior to debridement 4. Dressing Applied: Prisma Ag 5. Secondary Dressing Applied Bordered Foam Dressing Wound #6 (Right, Lateral Malleolus) 1. Cleansed with: Granberg, Marky E. ([id number]) Cleanse wound with antibacterial soap and water 4. Dressing Applied: Prisma Ag Santyl Ointment 5. Secondary Dressing Applied ABD Pad 7. Secured with Other (specify in notes) Notes kerlix/coban wrap Wound #8 (Right, Lateral [name]) 1. Cleansed with: Cleanse wound with antibacterial soap and water 4. Dressing Applied: Prisma Ag Santyl Ointment 5. Secondary Dressing Applied ABD Pad 7. Secured with Other (specify in notes) Notes kerlix/coban wrap Wound #9 (Left Metatarsal head first) 1. Cleansed with: Clean wound with Normal Saline 2. Anesthetic Topical Lidocaine 4% cream to wound bed prior to debridement 4. Dressing Applied: Prisma Ag 5. Secondary Dressing Applied Bordered Foam Dressing Electronic Signature(s) Signed: 07/15/2017 6:13:38 PM By: Linton Ham MD Previous Signature: 07/15/2017 5:14:04 PM Version By: Roger Shelter Entered By: Linton Ham on 07/15/2017 18:07:52 Zaccaro, [name] ([id number]) -------------------------------------------------------------------------------- Multi-Disciplinary Care Plan Details Patient Name: [name] Date of Service: 07/15/2017 8:30 AM Medical Record Number: [id number] Patient Account Number: [id number] Date of Birth/Sex: 1940-10-27 (76 y.o. M) Treating RN: Roger Shelter Primary Care Almalik Weissberg: Cyndi Bender Other Clinician: Referring Makayleigh Poliquin: Cyndi Bender Treating Fiona Coto/Extender: Tito Dine in Treatment: 14 Active Inactive ` Abuse / Safety / Falls / Self Care Management Nursing  Diagnoses: History of Falls Goals: Patient will remain injury free related to falls Date Initiated: 04/08/2017 Target Resolution Date: 05/08/2017 Goal Status: Active Interventions: Assess fall risk on admission and as needed  Notes: ` Orientation to the Wound Care Program Nursing Diagnoses: Knowledge deficit related to the wound healing center program Goals: Patient/caregiver will verbalize understanding of the [place] Date Initiated: 04/08/2017 Target Resolution Date: 05/08/2017 Goal Status: Active Interventions: Provide education on orientation to the wound center Notes: ` Soft Tissue Infection Nursing Diagnoses: Impaired tissue integrity Potential for infection: soft tissue Goals: Patient will remain free of wound infection Date Initiated: 04/08/2017 Target Resolution Date: 05/08/2017 Goal Status: Active DWYANE, DUPREE ([id number]) Interventions: Assess signs and symptoms of infection every visit Notes: ` Wound/Skin Impairment Nursing Diagnoses: Impaired tissue integrity Goals: Ulcer/skin breakdown will heal within 14 weeks Date Initiated: 04/08/2017 Target Resolution Date: 07/20/2017 Goal Status: Active Interventions: Assess patient/caregiver ability to perform ulcer/skin care regimen upon admission and as needed Provide education on ulcer and skin care Treatment Activities: Topical wound management initiated : 04/08/2017 Notes: Electronic Signature(s) Signed: 07/15/2017 5:14:04 PM By: Roger Shelter Entered By: Roger Shelter on 07/15/2017 08:59:28 Foutz, [name] ([id number]) -------------------------------------------------------------------------------- Pain Assessment Details Patient Name: [name] Date of Service: 07/15/2017 8:30 AM Medical Record Number: [id number] Patient Account Number: [id number] Date of Birth/Sex: 02/20/1940 (76 y.o. M) Treating RN: Montey Hora Primary Care Johann Gascoigne: Cyndi Bender Other  Clinician: Referring Corrine Tillis: Cyndi Bender Treating Kemonie Cutillo/Extender: Tito Dine in Treatment: 14 Active Problems Location of Pain Severity and Description of Pain Patient Has Paino No Site Locations Pain Management and Medication Current Pain Management: Electronic Signature(s) Signed: 07/15/2017 5:21:12 PM By: Montey Hora Entered By: Montey Hora on 07/15/2017 08:32:12 Bowler, [name] ([id number]) -------------------------------------------------------------------------------- Patient/Caregiver Education Details Patient Name: [name] Date of Service: 07/15/2017 8:30 AM Medical Record Number: [id number] Patient Account Number: [id number] Date of Birth/Gender: October 10, 1940 (76 y.o. M) Treating RN: Montey Hora Primary Care Physician: Cyndi Bender Other Clinician: Referring Physician: Cyndi Bender Treating Physician/Extender: Tito Dine in Treatment: 14 Education Assessment Education Provided To: Patient and Caregiver Education Topics Provided Wound/Skin Impairment: Handouts: Other: wound care of new wounds Methods: Demonstration, Explain/Verbal Responses: State content correctly Electronic Signature(s) Signed: 07/15/2017 5:21:12 PM By: Montey Hora Entered By: Montey Hora on 07/15/2017 10:00:00 Mcanelly, [name] ([id number]) -------------------------------------------------------------------------------- Wound Assessment Details Patient Name: [name] Date of Service: 07/15/2017 8:30 AM Medical Record Number: [id number] Patient Account Number: [id number] Date of Birth/Sex: [birth year] (76 y.o. M) Treating RN: Montey Hora Primary Care Takenya Travaglini: Cyndi Bender Other Clinician: Referring Valjean Ruppel: Cyndi Bender Treating Shamarie Call/Extender: Tito Dine in Treatment: 14 Wound Status Wound Number: 10 Primary Venous Leg Ulcer Etiology: Wound Location: Right Lower Leg - Anterior Wound  Open Wounding Event: Gradually Appeared Status: Date Acquired: 07/08/2017 Comorbid Congestive Heart Failure, Coronary Artery Weeks Of Treatment: 1 History: Disease, Hypertension, Myocardial Infarction, Clustered Wound: No Osteoarthritis Photos Photo Uploaded By: Montey Hora on 07/15/2017 09:01:22 Wound Measurements Length: (cm) 0.6 Width: (cm) 0.5 Depth: (cm) 0.1 Area: (cm) 0.236 Volume: (cm) 0.024 % Reduction in Area: -50.3% % Reduction in Volume: 52% Epithelialization: None Tunneling: No Undermining: No Wound Description Full Thickness Without Exposed Support Classification: Structures Wound Margin: Distinct, outline attached Exudate Medium Amount: Exudate Type: Serous Exudate Color: amber Foul Odor After Cleansing: No Slough/Fibrino Yes Wound Bed Granulation Amount: None Present (0%) Exposed Structure Necrotic Amount: Large (67-100%) Fascia Exposed: No Necrotic Quality: Adherent Slough Fat Layer (Subcutaneous Tissue) Exposed: Yes Tendon Exposed: No Muscle Exposed: No Joint Exposed: No Bone Exposed: No Looman, Kase E. ([id number]) Periwound Skin Texture Texture Color No Abnormalities Noted: No No Abnormalities Noted: No Callus:  No Atrophie Blanche: No Crepitus: No Cyanosis: No Excoriation: No Ecchymosis: No Induration: No Erythema: No Rash: No Hemosiderin Staining: No Scarring: No Mottled: No Pallor: No Moisture Rubor: No No Abnormalities Noted: No Dry / Scaly: No Temperature / Pain Maceration: No Temperature: No Abnormality Tenderness on Palpation: Yes Wound Preparation Ulcer Cleansing: Other: soap and water, Topical Anesthetic Applied: Other: lidocaine 4%, Treatment Notes Wound #10 (Right, Anterior Lower Leg) 1. Cleansed with: Cleanse wound with antibacterial soap and water 4. Dressing Applied: Prisma Ag Santyl Ointment 5. Secondary Dressing Applied ABD Pad 7. Secured with Other (specify in notes) Notes kerlix/coban  wrap Electronic Signature(s) Signed: 07/15/2017 5:21:12 PM By: Montey Hora Entered By: Montey Hora on 07/15/2017 08:39:59 Gusler, [name] ([id number]) -------------------------------------------------------------------------------- Wound Assessment Details Patient Name: [name] Date of Service: 07/15/2017 8:30 AM Medical Record Number: [id number] Patient Account Number: [id number] Date of Birth/Sex: 1940-08-31 (76 y.o. M) Treating RN: Montey Hora Primary Care Nusrat Encarnacion: Cyndi Bender Other Clinician: Referring Jamilynn Whitacre: Cyndi Bender Treating Cheyan Frees/Extender: Tito Dine in Treatment: 14 Wound Status Wound Number: 11 Primary Venous Leg Ulcer Etiology: Wound Location: Right Lower Leg - Posterior Wound Open Wounding Event: Not Known Status: Date Acquired: 07/15/2017 Comorbid Congestive Heart Failure, Coronary Artery Weeks Of Treatment: 0 History: Disease, Hypertension, Myocardial Infarction, Clustered Wound: No Osteoarthritis Photos Photo Uploaded By: Montey Hora on 07/15/2017 09:01:22 Wound Measurements Length: (cm) 1 Width: (cm) 0.6 Depth: (cm) 0.1 Area: (cm) 0.471 Volume: (cm) 0.047 % Reduction in Area: % Reduction in Volume: Epithelialization: Large (67-100%) Tunneling: No Undermining: No Wound Description Classification: Partial Thickness Wound Margin: Flat and Intact Exudate Amount: Medium Exudate Type: Sanguinous Exudate Color: red Foul Odor After Cleansing: No Slough/Fibrino No Wound Bed Granulation Amount: Large (67-100%) Exposed Structure Granulation Quality: Pink Fascia Exposed: No Necrotic Amount: None Present (0%) Fat Layer (Subcutaneous Tissue) Exposed: No Tendon Exposed: No Muscle Exposed: No Joint Exposed: No Bone Exposed: No Periwound Skin Texture Whiteside, Cassie E. ([id number]) Texture Color No Abnormalities Noted: No No Abnormalities Noted: No Callus: No Atrophie Blanche: No Crepitus:  No Cyanosis: No Excoriation: No Ecchymosis: No Induration: No Erythema: No Rash: No Hemosiderin Staining: No Scarring: No Mottled: No Pallor: No Moisture Rubor: No No Abnormalities Noted: No Dry / Scaly: No Temperature / Pain Maceration: No Temperature: No Abnormality Tenderness on Palpation: Yes Wound Preparation Ulcer Cleansing: Other: soap and water, Topical Anesthetic Applied: None Treatment Notes Wound #11 (Right, Posterior Lower Leg) 1. Cleansed with: Cleanse wound with antibacterial soap and water 4. Dressing Applied: Prisma Ag Santyl Ointment 5. Secondary Dressing Applied ABD Pad 7. Secured with Other (specify in notes) Notes kerlix/coban wrap Electronic Signature(s) Signed: 07/15/2017 5:21:12 PM By: Montey Hora Entered By: Montey Hora on 07/15/2017 08:44:07 [place], [name] ([id number]) -------------------------------------------------------------------------------- Wound Assessment Details Patient Name: [name] Date of Service: 07/15/2017 8:30 AM Medical Record Number: [id number] Patient Account Number: [id number] Date of Birth/Sex: 1940/10/20 (76 y.o. M) Treating RN: Montey Hora Primary Care Jasir Rother: Cyndi Bender Other Clinician: Referring Lorielle Boehning: Cyndi Bender Treating Arwilda Georgia/Extender: Tito Dine in Treatment: 14 Wound Status Wound Number: 12 Primary Skin Tear Etiology: Wound Location: Right Hand - 1st Digit Wound Open Wounding Event: Trauma Status: Date Acquired: 07/10/2017 Comorbid Congestive Heart Failure, Coronary Artery Weeks Of Treatment: 0 History: Disease, Hypertension, Myocardial Infarction, Clustered Wound: No Osteoarthritis Photos Photo Uploaded By: Montey Hora on 07/15/2017 09:01:57 Wound Measurements Length: (cm) 1.2 Width: (cm) 1 Depth: (cm) 0.1 Area: (cm) 0.942 Volume: (cm) 0.094 % Reduction in Area: %  Reduction in Volume: Epithelialization: Medium (34-66%) Tunneling:  No Undermining: No Wound Description Full Thickness Without Exposed Support Classification: Structures Wound Margin: Flat and Intact Exudate Small Amount: Exudate Type: Sanguinous Exudate Color: red Foul Odor After Cleansing: No Slough/Fibrino Yes Wound Bed Granulation Amount: Large (67-100%) Exposed Structure Granulation Quality: Pink Fascia Exposed: No Necrotic Amount: Small (1-33%) Fat Layer (Subcutaneous Tissue) Exposed: No Necrotic Quality: Adherent Slough Tendon Exposed: No Muscle Exposed: No Joint Exposed: No Bone Exposed: No Mireles, Ares E. ([id number]) Periwound Skin Texture Texture Color No Abnormalities Noted: No No Abnormalities Noted: No Callus: No Atrophie Blanche: No Crepitus: No Cyanosis: No Excoriation: No Ecchymosis: No Induration: No Erythema: No Rash: No Hemosiderin Staining: No Scarring: No Mottled: No Pallor: No Moisture Rubor: No No Abnormalities Noted: No Dry / Scaly: No Temperature / Pain Maceration: No Temperature: No Abnormality Tenderness on Palpation: Yes Wound Preparation Ulcer Cleansing: Rinsed/Irrigated with Saline Topical Anesthetic Applied: Other: lidocaine 4%, Treatment Notes Wound #12 (Right Hand - 1st Digit) 1. Cleansed with: Clean wound with Normal Saline 2. Anesthetic Topical Lidocaine 4% cream to wound bed prior to debridement 4. Dressing Applied: Prisma Ag 5. Secondary Dressing Applied Bordered Foam Dressing Electronic Signature(s) Signed: 07/15/2017 5:21:12 PM By: Montey Hora Entered By: Montey Hora on 07/15/2017 08:49:14 [place], [name] ([id number]) -------------------------------------------------------------------------------- Wound Assessment Details Patient Name: [name] Date of Service: 07/15/2017 8:30 AM Medical Record Number: [id number] Patient Account Number: [id number] Date of Birth/Sex: 1940-05-15 (76 y.o. M) Treating RN: Montey Hora Primary Care Lunden Mcleish: Cyndi Bender Other Clinician: Referring Chauntel Windsor: Cyndi Bender Treating Shereese Bonnie/Extender: Tito Dine in Treatment: 14 Wound Status Wound Number: 13 Primary Skin Tear Etiology: Wound Location: Right Forearm Wound Open Wounding Event: Trauma Status: Date Acquired: 07/10/2017 Comorbid Congestive Heart Failure, Coronary Artery Weeks Of Treatment: 0 History: Disease, Hypertension, Myocardial Infarction, Clustered Wound: No Osteoarthritis Photos Photo Uploaded By: Montey Hora on 07/15/2017 09:01:57 Wound Measurements Length: (cm) 1.5 Width: (cm) 2.1 Depth: (cm) 0.1 Area: (cm) 2.474 Volume: (cm) 0.247 % Reduction in Area: % Reduction in Volume: Epithelialization: None Tunneling: No Undermining: No Wound Description Full Thickness Without Exposed Support Classification: Structures Wound Margin: Flat and Intact Exudate Medium Amount: Exudate Type: Sanguinous Exudate Color: red Foul Odor After Cleansing: No Slough/Fibrino Yes Wound Bed Granulation Amount: Small (1-33%) Exposed Structure Granulation Quality: Pink Fascia Exposed: No Necrotic Amount: Large (67-100%) Fat Layer (Subcutaneous Tissue) Exposed: No Necrotic Quality: Adherent Slough Tendon Exposed: No Muscle Exposed: No Joint Exposed: No Bone Exposed: No Mcnall, Rawlin E. ([id number]) Periwound Skin Texture Texture Color No Abnormalities Noted: No No Abnormalities Noted: No Callus: No Atrophie Blanche: No Crepitus: No Cyanosis: No Excoriation: No Ecchymosis: No Induration: No Erythema: No Rash: No Hemosiderin Staining: No Scarring: No Mottled: No Pallor: No Moisture Rubor: No No Abnormalities Noted: No Dry / Scaly: No Temperature / Pain Maceration: No Temperature: No Abnormality Tenderness on Palpation: Yes Wound Preparation Ulcer Cleansing: Rinsed/Irrigated with Saline Topical Anesthetic Applied: Other: lidocaine 4%, Treatment Notes Wound #13 (Right Forearm) 1. Cleansed  with: Clean wound with Normal Saline 2. Anesthetic Topical Lidocaine 4% cream to wound bed prior to debridement 4. Dressing Applied: Prisma Ag 5. Secondary Dressing Applied Bordered Foam Dressing Electronic Signature(s) Signed: 07/15/2017 5:21:12 PM By: Montey Hora Entered By: Montey Hora on 07/15/2017 08:50:31 Mordan, [name] ([id number]) -------------------------------------------------------------------------------- Wound Assessment Details Patient Name: [name] Date of Service: 07/15/2017 8:30 AM Medical Record Number: [id number] Patient Account Number: [id number] Date of Birth/Sex: Aug 04, 1940 (76 y.o. M) Treating  RN: Montey Hora Primary Care Fancy Dunkley: Cyndi Bender Other Clinician: Referring Bettyann Birchler: Cyndi Bender Treating Heatherly Stenner/Extender: Tito Dine in Treatment: 14 Wound Status Wound Number: 14 Primary Skin Tear Etiology: Wound Location: Right Upper Arm Wound Open Wounding Event: Trauma Status: Date Acquired: 07/10/2017 Comorbid Congestive Heart Failure, Coronary Artery Weeks Of Treatment: 0 History: Disease, Hypertension, Myocardial Infarction, Clustered Wound: No Osteoarthritis Photos Photo Uploaded By: Montey Hora on 07/15/2017 09:03:33 Wound Measurements Length: (cm) 1.1 Width: (cm) 2.3 Depth: (cm) 0.1 Area: (cm) 1.987 Volume: (cm) 0.199 % Reduction in Area: % Reduction in Volume: Epithelialization: Small (1-33%) Tunneling: No Undermining: No Wound Description Full Thickness Without Exposed Support Classification: Structures Wound Margin: Flat and Intact Exudate Medium Amount: Exudate Type: Serous Exudate Color: amber Foul Odor After Cleansing: No Slough/Fibrino No Wound Bed Granulation Amount: Medium (34-66%) Exposed Structure Granulation Quality: Pink Fascia Exposed: No Necrotic Amount: Medium (34-66%) Fat Layer (Subcutaneous Tissue) Exposed: No Necrotic Quality: Adherent Slough Tendon Exposed:  No Muscle Exposed: No Joint Exposed: No Bone Exposed: No Bihm, Antwoine E. ([id number]) Periwound Skin Texture Texture Color No Abnormalities Noted: No No Abnormalities Noted: No Callus: No Atrophie Blanche: No Crepitus: No Cyanosis: No Excoriation: No Ecchymosis: No Induration: No Erythema: No Rash: No Hemosiderin Staining: No Scarring: No Mottled: No Pallor: No Moisture Rubor: No No Abnormalities Noted: No Dry / Scaly: No Temperature / Pain Maceration: No Temperature: No Abnormality Tenderness on Palpation: Yes Wound Preparation Ulcer Cleansing: Rinsed/Irrigated with Saline Topical Anesthetic Applied: Other: lidocaine 4%, Treatment Notes Wound #14 (Right Upper Arm) 1. Cleansed with: Clean wound with Normal Saline 2. Anesthetic Topical Lidocaine 4% cream to wound bed prior to debridement 4. Dressing Applied: Prisma Ag 5. Secondary Dressing Applied Bordered Foam Dressing Electronic Signature(s) Signed: 07/15/2017 5:21:12 PM By: Montey Hora Entered By: Montey Hora on 07/15/2017 08:51:54 Sch[place]ne[place], [name] ([id number]) -------------------------------------------------------------------------------- Wound Assessment Details Patient Name: [name] Date of Service: 07/15/2017 8:30 AM Medical Record Number: [id number] Patient Account Number: [id number] Date of Birth/Sex: 12-May-1940 (76 y.o. M) Treating RN: Montey Hora Primary Care Jamie-Lee Galdamez: Cyndi Bender Other Clinician: Referring Temika Sutphin: Cyndi Bender Treating Aruna Nestler/Extender: Tito Dine in Treatment: 14 Wound Status Wound Number: 6 Primary Arterial Insufficiency Ulcer Etiology: Wound Location: Right Malleolus - Lateral Wound Open Wounding Event: Gradually Appeared Status: Date Acquired: 03/23/2017 Comorbid Congestive Heart Failure, Coronary Artery Weeks Of Treatment: 14 History: Disease, Hypertension, Myocardial Infarction, Clustered Wound:  No Osteoarthritis Photos Photo Uploaded By: Montey Hora on 07/15/2017 09:03:34 Wound Measurements Length: (cm) 0.6 Width: (cm) 0.5 Depth: (cm) 0.3 Area: (cm) 0.236 Volume: (cm) 0.071 % Reduction in Area: -661.3% % Reduction in Volume: -2266.7% Epithelialization: None Tunneling: No Undermining: No Wound Description Full Thickness Without Exposed Support Foul Classification: Structures Sloug Wound Margin: Distinct, outline attached Exudate Large Amount: Exudate Type: Serous Exudate Color: amber Odor After Cleansing: No h/Fibrino Yes Wound Bed Granulation Amount: Medium (34-66%) Exposed Structure Granulation Quality: Red, Pink Fascia Exposed: No Necrotic Amount: Medium (34-66%) Fat Layer (Subcutaneous Tissue) Exposed: No Necrotic Quality: Adherent Slough Tendon Exposed: No Muscle Exposed: No Joint Exposed: No Bone Exposed: No [place], Mihailo E. ([id number]) Periwound Skin Texture Texture Color No Abnormalities Noted: No No Abnormalities Noted: No Callus: Yes Atrophie Blanche: No Crepitus: No Cyanosis: No Excoriation: No Ecchymosis: No Induration: No Erythema: No Rash: No Hemosiderin Staining: No Scarring: No Mottled: No Pallor: No Moisture Rubor: No No Abnormalities Noted: No Dry / Scaly: No Temperature / Pain Maceration: No Temperature: No Abnormality Tenderness on Palpation: Yes Wound  Preparation Ulcer Cleansing: Other: soap and water, Topical Anesthetic Applied: Other: lidocaine 4%, Treatment Notes Wound #6 (Right, Lateral Malleolus) 1. Cleansed with: Cleanse wound with antibacterial soap and water 4. Dressing Applied: Prisma Ag Santyl Ointment 5. Secondary Dressing Applied ABD Pad 7. Secured with Other (specify in notes) Notes kerlix/coban wrap Electronic Signature(s) Signed: 07/15/2017 5:21:12 PM By: Montey Hora Entered By: Montey Hora on 07/15/2017 08:40:42 [place], [name]  ([id number]) -------------------------------------------------------------------------------- Wound Assessment Details Patient Name: [name] Date of Service: 07/15/2017 8:30 AM Medical Record Number: [id number] Patient Account Number: [id number] Date of Birth/Sex: 16-Jan-1940 (76 y.o. M) Treating RN: Montey Hora Primary Care Fedrick Cefalu: Cyndi Bender Other Clinician: Referring Shye Doty: Cyndi Bender Treating Bernarr Longsworth/Extender: Tito Dine in Treatment: 14 Wound Status Wound Number: 8 Primary Arterial Insufficiency Ulcer Etiology: Wound Location: Right [name] - Lateral Wound Open Wounding Event: Gradually Appeared Status: Date Acquired: 04/29/2017 Comorbid Congestive Heart Failure, Coronary Artery Weeks Of Treatment: 11 History: Disease, Hypertension, Myocardial Infarction, Clustered Wound: No Osteoarthritis Photos Photo Uploaded By: Montey Hora on 07/15/2017 09:04:01 Wound Measurements Length: (cm) 0.7 Width: (cm) 0.6 Depth: (cm) 0.4 Area: (cm) 0.33 Volume: (cm) 0.132 % Reduction in Area: -364.8% % Reduction in Volume: -1785.7% Epithelialization: None Tunneling: No Undermining: No Wound Description Full Thickness With Exposed Support Foul Classification: Structures Sloug Wound Margin: Distinct, outline attached Exudate Large Amount: Exudate Type: Serous Exudate Color: amber Odor After Cleansing: No h/Fibrino Yes Wound Bed Granulation Amount: None Present (0%) Exposed Structure Necrotic Amount: Large (67-100%) Fascia Exposed: No Necrotic Quality: Adherent Slough Fat Layer (Subcutaneous Tissue) Exposed: Yes Tendon Exposed: No Muscle Exposed: No Joint Exposed: No Bone Exposed: Yes Slivinski, Adebayo E. ([id number]) Periwound Skin Texture Texture Color No Abnormalities Noted: No No Abnormalities Noted: No Callus: Yes Temperature / Pain Moisture Temperature: No Abnormality No Abnormalities Noted: No Tenderness on Palpation:  Yes Maceration: Yes Wound Preparation Ulcer Cleansing: Other: soap and water, Topical Anesthetic Applied: Other: lidocaine 4%, Treatment Notes Wound #8 (Right, Lateral [name]) 1. Cleansed with: Cleanse wound with antibacterial soap and water 4. Dressing Applied: Prisma Ag Santyl Ointment 5. Secondary Dressing Applied ABD Pad 7. Secured with Other (specify in notes) Notes kerlix/coban wrap Electronic Signature(s) Signed: 07/15/2017 5:21:12 PM By: Montey Hora Entered By: Montey Hora on 07/15/2017 08:41:25 Corl, [name] ([id number]) -------------------------------------------------------------------------------- Wound Assessment Details Patient Name: [name] Date of Service: 07/15/2017 8:30 AM Medical Record Number: [id number] Patient Account Number: [id number] Date of Birth/Sex: 09-15-1940 (76 y.o. M) Treating RN: Montey Hora Primary Care Mindel Friscia: Cyndi Bender Other Clinician: Referring Laterria Lasota: Cyndi Bender Treating Hayli Milligan/Extender: Tito Dine in Treatment: 14 Wound Status Wound Number: 9 Primary Pressure Ulcer Etiology: Wound Location: Left Metatarsal head first Wound Open Wounding Event: Gradually Appeared Status: Date Acquired: 05/06/2017 Comorbid Congestive Heart Failure, Coronary Artery Weeks Of Treatment: 8 History: Disease, Hypertension, Myocardial Infarction, Clustered Wound: No Osteoarthritis Photos Photo Uploaded By: Montey Hora on 07/15/2017 09:04:01 Wound Measurements Length: (cm) 0.3 Width: (cm) 0.4 Depth: (cm) 0.1 Area: (cm) 0.094 Volume: (cm) 0.009 % Reduction in Area: 52% % Reduction in Volume: 55% Epithelialization: None Tunneling: No Undermining: No Wound Description Classification: Category/Stage II Wound Margin: Flat and Intact Exudate Amount: Medium Exudate Type: Serous Exudate Color: amber Foul Odor After Cleansing: No Slough/Fibrino Yes Wound Bed Granulation Amount: None Present  (0%) Exposed Structure Necrotic Amount: Large (67-100%) Fascia Exposed: No Necrotic Quality: Adherent Slough Fat Layer (Subcutaneous Tissue) Exposed: Yes Tendon Exposed: No Muscle Exposed: No Joint Exposed: No Bone Exposed: No  Periwound Skin Texture Rizzo, Jiaire E. ([id number]) Texture Color No Abnormalities Noted: No No Abnormalities Noted: No Callus: Yes Atrophie Blanche: No Crepitus: No Cyanosis: No Excoriation: No Ecchymosis: No Induration: No Erythema: No Rash: No Hemosiderin Staining: No Scarring: No Mottled: No Pallor: No Moisture Rubor: No No Abnormalities Noted: No Dry / Scaly: No Temperature / Pain Maceration: Yes Temperature: No Abnormality Wound Preparation Ulcer Cleansing: Rinsed/Irrigated with Saline Topical Anesthetic Applied: Other: lidocaine 4%, Treatment Notes Wound #9 (Left Metatarsal head first) 1. Cleansed with: Clean wound with Normal Saline 2. Anesthetic Topical Lidocaine 4% cream to wound bed prior to debridement 4. Dressing Applied: Prisma Ag 5. Secondary Dressing Applied Bordered Foam Dressing Electronic Signature(s) Signed: 07/15/2017 5:21:12 PM By: Montey Hora Entered By: Montey Hora on 07/15/2017 08:42:05 Jef[place], [name] ([id number]) -------------------------------------------------------------------------------- Vitals Details Patient Name: [name] Date of Service: 07/15/2017 8:30 AM Medical Record Number: [id number] Patient Account Number: [id number] Date of Birth/Sex: [birth year] (76 y.o. M) Treating RN: Montey Hora Primary Care Naturi Alarid: Cyndi Bender Other Clinician: Referring Doshie Maggi: Cyndi Bender Treating Tereza Gilham/Extender: Tito Dine in Treatment: 14 Vital Signs Time Taken: 08:32 Temperature (F): 97.6 Height (in): 68 Pulse (bpm): 70 Respiratory Rate (breaths/min): 16 Blood Pressure (mmHg): 100/45 Reference Range: 80 - 120 mg / dl Electronic Signature(s) Signed: 07/15/2017  5:21:12 PM By: Montey Hora Entered By: Montey Hora on 07/15/2017 08:32:33

## 2017-07-16 NOTE — Progress Notes (Signed)
DAMAIN, BROADUS ([id number]) Visit Report for 07/15/2017 Debridement Details Patient Name: [name], [name] Date of Service: 07/15/2017 8:30 AM Medical Record Number: [id number] Patient Account Number: [id number] Date of Birth/Sex: 16-Nov-1940 (76 y.o. M) Treating RN: Roger Shelter Primary Care Provider: Cyndi Bender Other Clinician: Referring Provider: Cyndi Bender Treating Provider/Extender: Tito Dine in Treatment: 14 Debridement Performed for Wound #14 Right Upper Arm Assessment: Performed By: Physician Ricard Dillon, MD Debridement Type: Debridement Pre-procedure Verification/Time Yes - 09:00 Out Taken: Start Time: 09:00 Pain Control: Other : lidocaine 4% Total Area Debrided (L x W): 1.1 (cm) x 2.3 (cm) = 2.53 (cm) Tissue and other material Viable, Non-Viable, Slough, Subcutaneous, Biofilm, Slough debrided: Level: Skin/Subcutaneous Tissue Debridement Description: Excisional Instrument: Curette Bleeding: Minimum Hemostasis Achieved: Pressure End Time: 09:00 Procedural Pain: 0 Post Procedural Pain: 0 Response to Treatment: Procedure was tolerated well Level of Consciousness: Awake and Alert Post Debridement Measurements of Total Wound Length: (cm) 1.1 Width: (cm) 2.3 Depth: (cm) 0.1 Volume: (cm) 0.199 Character of Wound/Ulcer Post Debridement: Stable Post Procedure Diagnosis Same as Pre-procedure Electronic Signature(s) Signed: 07/15/2017 5:14:04 PM By: Roger Shelter Signed: 07/15/2017 6:13:38 PM By: Linton Ham MD Entered By: Roger Shelter on 07/15/2017 09:02:45 Rappa, [name] ([id number]) -------------------------------------------------------------------------------- Debridement Details Patient Name: [name] Date of Service: 07/15/2017 8:30 AM Medical Record Number: [id number] Patient Account Number: [id number] Date of Birth/Sex: 1940-12-07 (76 y.o. M) Treating RN: Roger Shelter Primary Care Provider:  Cyndi Bender Other Clinician: Referring Provider: Cyndi Bender Treating Provider/Extender: Tito Dine in Treatment: 14 Debridement Performed for Wound #10 Right,Anterior Lower Leg Assessment: Performed By: Physician Ricard Dillon, MD Debridement Type: Debridement Severity of Tissue Pre Fat layer exposed Debridement: Pre-procedure Verification/Time Yes - 09:00 Out Taken: Start Time: 09:00 Pain Control: Other : lidocaine 4% Total Area Debrided (L x W): 0.6 (cm) x 0.5 (cm) = 0.3 (cm) Tissue and other material Viable, Non-Viable, Slough, Subcutaneous, Biofilm, Slough debrided: Level: Skin/Subcutaneous Tissue Debridement Description: Excisional Instrument: Curette Bleeding: Minimum Hemostasis Achieved: Pressure End Time: 09:02 Procedural Pain: 0 Post Procedural Pain: 0 Response to Treatment: Procedure was tolerated well Level of Consciousness: Awake and Alert Post Debridement Measurements of Total Wound Length: (cm) 0.6 Width: (cm) 0.5 Depth: (cm) 0.1 Volume: (cm) 0.024 Character of Wound/Ulcer Post Debridement: Stable Severity of Tissue Post Debridement: Fat layer exposed Post Procedure Diagnosis Same as Pre-procedure Electronic Signature(s) Signed: 07/15/2017 5:14:04 PM By: Roger Shelter Signed: 07/15/2017 6:13:38 PM By: Linton Ham MD Entered By: Roger Shelter on 07/15/2017 09:03:49 [place], [name] ([id number]) -------------------------------------------------------------------------------- Debridement Details Patient Name: [name] Date of Service: 07/15/2017 8:30 AM Medical Record Number: [id number] Patient Account Number: [id number] Date of Birth/Sex: 1940/07/04 (76 y.o. M) Treating RN: Roger Shelter Primary Care Provider: Cyndi Bender Other Clinician: Referring Provider: Cyndi Bender Treating Provider/Extender: Tito Dine in Treatment: 14 Debridement Performed for Wound #8 Right,Lateral  [name] Assessment: Performed By: Physician Ricard Dillon, MD Debridement Type: Debridement Severity of Tissue Pre Fat layer exposed Debridement: Pre-procedure Verification/Time Yes - 09:00 Out Taken: Start Time: 09:00 Pain Control: Other : lidocaine 4% Total Area Debrided (L x W): 0.7 (cm) x 0.6 (cm) = 0.42 (cm) Tissue and other material Viable, Non-Viable, Slough, Subcutaneous, Biofilm, Slough debrided: Level: Skin/Subcutaneous Tissue Debridement Description: Excisional Instrument: Curette Bleeding: Minimum Hemostasis Achieved: Pressure End Time: 09:00 Procedural Pain: 0 Post Procedural Pain: 0 Response to Treatment: Procedure was tolerated well Level of Consciousness: Awake and Alert Post Debridement Measurements of Total  Wound Length: (cm) 0.7 Width: (cm) 0.6 Depth: (cm) 0.4 Volume: (cm) 0.132 Character of Wound/Ulcer Post Debridement: Stable Severity of Tissue Post Debridement: Fat layer exposed Post Procedure Diagnosis Same as Pre-procedure Electronic Signature(s) Signed: 07/15/2017 5:14:04 PM By: Roger Shelter Signed: 07/15/2017 6:13:38 PM By: Linton Ham MD Entered By: Roger Shelter on 07/15/2017 09:04:29 [place], [name] ([id number]) -------------------------------------------------------------------------------- Debridement Details Patient Name: [name] Date of Service: 07/15/2017 8:30 AM Medical Record Number: [id number] Patient Account Number: [id number] Date of Birth/Sex: [birth year] (76 y.o. M) Treating RN: Roger Shelter Primary Care Provider: Cyndi Bender Other Clinician: Referring Provider: Cyndi Bender Treating Provider/Extender: Tito Dine in Treatment: 14 Debridement Performed for Wound #13 Right Forearm Assessment: Performed By: Physician Ricard Dillon, MD Debridement Type: Debridement Pre-procedure Verification/Time Yes - 09:00 Out Taken: Start Time: 09:00 Pain Control: Other : lidocaine  4% Total Area Debrided (L x W): 1.5 (cm) x 2.1 (cm) = 3.15 (cm) Tissue and other material Viable, Non-Viable, Slough, Subcutaneous, Biofilm, Slough debrided: Level: Skin/Subcutaneous Tissue Debridement Description: Excisional Instrument: Curette Bleeding: Minimum Hemostasis Achieved: Pressure End Time: 09:00 Procedural Pain: 0 Post Procedural Pain: 0 Response to Treatment: Procedure was tolerated well Level of Consciousness: Awake and Alert Post Debridement Measurements of Total Wound Length: (cm) 1.5 Width: (cm) 2.1 Depth: (cm) 0.1 Volume: (cm) 0.247 Character of Wound/Ulcer Post Debridement: Stable Post Procedure Diagnosis Same as Pre-procedure Electronic Signature(s) Signed: 07/15/2017 5:14:04 PM By: Roger Shelter Signed: 07/15/2017 6:13:38 PM By: Linton Ham MD Entered By: Roger Shelter on 07/15/2017 09:05:32 [place], [name] ([id number]) -------------------------------------------------------------------------------- HPI Details Patient Name: [name] Date of Service: 07/15/2017 8:30 AM Medical Record Number: [id number] Patient Account Number: [id number] Date of Birth/Sex: 1940/11/18 (76 y.o. M) Treating RN: Roger Shelter Primary Care Provider: Cyndi Bender Other Clinician: Referring Provider: Cyndi Bender Treating Provider/Extender: Tito Dine in Treatment: 14 History of Present Illness HPI Description: 04/08/17; this is a complex 77 year old man referred here from [facility]. He had been referred there for bilateral lower extremity edema with ulcer formation predominantly on the right calf but also the right [name]. He had been receiving Unna boots bilaterally. The history here is long. He is not a diabetic however ICU looking through late 2018 he was worked up for chronic headaches, elevated inflammatory markers including C-reactive protein and ESR. He went on to actually have a left temporal artery biopsy  wasn't that was negative.he received about 6 weeks of high-dose prednisone 60 mg with improvement in his inflammatory markers. He was admitted to hospital in late November with ventricular tachycardia syncope. He has known ischemic cardiomyopathy. He was admitted in the hospital in mid December. Apparently this was precipitated by a syncopal spell falling out of his scooter while at [employer]. There was ventricular arrhythmia. He has an implantable defibrillator and echocardiogram showed severe LV dysfunction with an EF of 20% and valvular regurgitations including mild AR, moderate MR. There was no stenosis. He ruled in for a non-ST elevation MI in the setting of V. tach.his wife states that sometime during this hospitalization she noted multiple areas of skin change on the right lower calf which became evident just after he left the hospital. He was back in hospital in February with acute renal failure hyponatremia. This responded to fluid resuscitation.. Interestingly I can't see much description of his right leg at that point in time.he was followed by Dr. Nehemiah Massed of dermatology for the necrotic wounds on his right leg. Apparently a biopsy was  planned at one point but not done although in some notes that suggests it was. I cannot see these results area He was noted to have a lot of edema. Was treated with bilateral Unna boots edges really helped with the swelling they have been using Bactroban to small open areas predominantly on the right anterior lower leg His history is complicated by the fact that he has rheumatoid arthritis followed by rheumatology. He is followed by neurology for disabling headaches. At one point this was felt to be giant cell arteritis although a left temporal biopsy was apparently negative. He was given a prolonged course of prednisone at 60 mg which managed his sedimentation rates but apparently did not prove improve the headaches. This is been tapered to off on by  rheumatology on 03/13/17 Vascular had plans to do a venous reflux workup as well as arterial studies in May. They also wanted to get him a lymphedema pump. As mentioned he's been using bacitracin under Unna boot wraps to both lower legs 04/15/17; the patient arrives with most of his wounds improved. These are small punched out wounds. Most of them remaining ones are on the right anterior calf with the most problematic over the right lateral malleolus. There are no new areas. The symptom complex or potential symptom complex we are dealing with his chronic disabling headaches with inflammatory markers not responsive to prednisone and with a negative temporal biopsy, lower extremity weakness, skin ulcerations just on the right leg. We have managed to get his arterial studies moved up to April 30. He has a rheumatology consult at [facility] in June. He has seen dermatology locally, rheumatology locally, neurology locally. 04/22/17; small punched out areas on the right leg anteriorly posteriorly. Most of these appear to have closed over. Some of them have eschar over the surface. The most problematic area appears to be over the right lateral malleolus. We've been using prisma to all of this. He has arterial studies on April 30 and a rheumatology consult at [facility] on June 28 04/29/17; most of the small punched out areas on the right leg posteriorly are closed. He has 2 or 3 openings anteriorly but most of these appear to be on the way to closing. Still problematically over the right lateral malleolus and right lateral [name] with almost ischemic-looking eschar. His arterial studies that I ordered are due to be done next week on the 30th so we should have them available for our next visit hopefully. He also saw a rheumatologist at [facility] and according to the patient he did 8 vials of blood. Finally he has scaling rash on his left [name] and what looks to be at [facility], The area on the left anterior leg 05/06/17; most of the small  punched out areas on the right leg posteriorly and anteriorly are closed. He still has one small one anteriorly one over the right lateral malleolus and one over the right lateral [name]. DVANTE, HANDS ([id number]) He had his arterial studies they didn't seem to do waveform analysis not exactly sure why however in any case is ABIs were noncompressible bilaterally. They did provide TBIs although looking at the pressures it appears that his TBIs are quite normal. I therefore went ahead and debrided the area over the right lateral malleolus and the right lateral [name] 05/13/17; most of the small punched out areas on the right leg posteriorly and anteriorly are closed. He continues to have problematic areas over the right lateral malleolus and the right lateral [name]. He  still requiring aggressive debridement of these 2 wounds using silver collagen I reviewed the note from rheumatology I don't think they came up with a specific diagnosis although he is known to have seropositive RA. They did a panel of lab work when I was able to see his his AMA was negative, anti-smooth muscle antibodies negative antineutrophil cytoplasmic antibodies negative,Liv/kia type 1 negative. Serum C3 and C4 were negative. I don't see his muscle enzymes specifically. He was referred back to his local rheumatologist for management of his known rheumatoid arthritis.the patient states he still feels weak and fatigued. He states he has numbness in both feet and apparently is known to have neuropathy 05/20/17; the patient continues to have a difficult problem on the right lateral malleolus and the right lateral [name]. Both of these wounds have no viable surface even with attempts at debridement. He has a new wound on the left plantar metatarsal head which looks more like a superficial diabetic pressure related injury then part of this underlying issue he has. Most of the rest of the wounds on his legs look satisfactory. Mostly on the  right calf.reviewed his arterial studies which showed noncompressible vessels bilaterally but the TBIs were quite normal. 05/27/17; no real improvement in the right lateral malleolus and right lateral [name]. In fact the right lateral [name] is now on bone. I gave him doxycycline empirically last week it appears that he developed photosensitivity was 8 the son in a tractor. I'll not give him any more of this. This is predominantly on his face and dorsal forearms and hands. I given this more as an anti- inflammatory. I'm going to have him seen by vascular surgery. His TBIs that he had done previously ordered by Dr. Brigitte Pulse were in the normal range They never did full arterial studies on him. he now has small punched out wounds on the right lateral ankle and right lateral [name]. I doubt these are ischemic however I would like a review of his macrovascular status. He does describe some pain at night. I'll reduce the compression from 3 layer to 2 layer and I'm not convinced that this is a macrovascular issue however I want to make sure. We've been using silver alginate 06/03/17; the patient's x-rays that I ordered last time of his right ankle and right lateral [name] did not show definite osteomyelitis. We've been using silver alginate. The wounds are not making any progress. The area on the plantar left first metatarsal head however appears to be better. The patient complains of weakness that is more of the fatigue. He says if he's walking his head will fall onto his chest and that his legs literally gave out on him. He did see neurology in the past however that was at a time where his workup was for temporal arteritis and headaches. 06/10/17; the patient is making no progress with the areas on the right lateral [name] and right lateral malleolus. In fact the area on the [name] probes to bone. X-ray did not show osteomyelitis. He went and saw vein and vascular on 06/04/17 he was felt to have significant reflux in the  left greater saphenous vein over this is not in the area we are most concerned about. He could be offered ablation. He was not felt to have venous reflux noted in the right lower extremity. He was felt to have some degree of lymphedema and he was felt to be a candidate for compression pumps. Finally a diagnostic arteriogram was suggested which is really what  I'm most interested in. The patient has wife wanted time to think about this, I think they were confused about interacting between venous and arterial discussions. I think it would be probably well worth going through the angiogram. Culture I did have the deeper area on the right lateral [name] showed a few Enterococcus faecalis. I would like to start her on amoxicillin which they will start today for one-week 500 3 times a day 06/17/17; the patient still has punched out areas on the right lateral [name] and right lateral malleolus. There is not a viable surface here. We have been using Santyl. He also has an area on the plantar aspect of his left first metatarsal head this also seems to be better 06/24/17; patient's angiogram as on 07/06/17; still has punched out areas on the right lateral [name] and right lateral malleolus to which we've been applying Santyl-based dressings. He also has a more superficial area on the left plantar first metatarsal head, using collagen here 07/08/17; the patient had his angiogram. This perineal artery had a 70-80% stenosis. Similarly the posterior tibial artery also was diseased. Furthermore down the posterior tibial artery in the distal segment was a short stenosis of 80%. His major vessels in his thigh and only minor irregularity. He had percutaneous angioplasty of the proximal right peroneal artery. Also angioplasty of the tibial peroneal trunk and proximal posterior tibial artery as well as the mid to distal segment of the posterior tibial artery. He handled this remarkably well. The patient arrived with a new wound on  his right anterior calf which I think is the reopening of one of the original small open areas 07/15/17; the areas on his right anterior calf is new. He has a new threatened area on the right tibia more superiorly which is not open yet but makes me wonder whether this is going to reopen as well. His original wounds on the right lateral [name] and right lateral malleolus are somewhat better in terms of surface ALEXI, GEIBEL ([id number]) Electronic Signature(s) Signed: 07/15/2017 6:13:38 PM By: Linton Ham MD Entered By: Linton Ham on 07/15/2017 18:08:50 [place], [name] ([id number]) -------------------------------------------------------------------------------- Physical Exam Details Patient Name: [name] Date of Service: 07/15/2017 8:30 AM Medical Record Number: [id number] Patient Account Number: [id number] Date of Birth/Sex: 1940-06-13 (76 y.o. M) Treating RN: Roger Shelter Primary Care Provider: Cyndi Bender Other Clinician: Referring Provider: Cyndi Bender Treating Provider/Extender: Tito Dine in Treatment: 14 Constitutional Sitting or standing Blood Pressure is within target range for patient.. Pulse regular and within target range for patient.[name] Respirations regular, non-labored and within target range.. Temperature is normal and within the target range for the patient.[name] appears in no distress. Notes wound exam; new open area on the right distal tibia and a threatened area on the more proximal tibia. oThe area on the right lateral malleolus and right lateral [name] still required debridement. Surface is somewhat better. oThe area of the first metatarsal head on the left looks like it's improving this looks like a pressure area we've been using collagen oMultiple areas of last skin laceration on the right arm from a fall last week required debridement. Necrotic surface debris removed hemostasis with direct pressure Electronic  Signature(s) Signed: 07/15/2017 6:13:38 PM By: Linton Ham MD Entered By: Linton Ham on 07/15/2017 18:10:13 [place], [name] ([id number]) -------------------------------------------------------------------------------- Physician Orders Details Patient Name: [name] Date of Service: 07/15/2017 8:30 AM Medical Record Number: [id number] Patient Account Number: [id number] Date of Birth/Sex: June 18, 1940 (76 y.o.  M) Treating RN: Roger Shelter Primary Care Provider: Cyndi Bender Other Clinician: Referring Provider: Cyndi Bender Treating Provider/Extender: Tito Dine in Treatment: 14 Verbal / Phone Orders: No Diagnosis Coding Wound Cleansing Wound #10 Right,Anterior Lower Leg o Clean wound with Normal Saline. Wound #11 Right,Posterior Lower Leg o Clean wound with Normal Saline. Wound #12 Right Hand - 1st Digit o Clean wound with Normal Saline. Wound #13 Right Forearm o Clean wound with Normal Saline. Wound #14 Right Upper Arm o Clean wound with Normal Saline. Wound #6 Right,Lateral Malleolus o Clean wound with Normal Saline. Wound #8 Right,Lateral [name] o Clean wound with Normal Saline. Wound #9 Left Metatarsal head first o Clean wound with Normal Saline. Anesthetic (add to Medication List) Wound #10 Right,Anterior Lower Leg o Topical Lidocaine 4% cream applied to wound bed prior to debridement (In Clinic Only). Wound #11 Right,Posterior Lower Leg o Topical Lidocaine 4% cream applied to wound bed prior to debridement (In Clinic Only). Wound #12 Right Hand - 1st Digit o Topical Lidocaine 4% cream applied to wound bed prior to debridement (In Clinic Only). Wound #13 Right Forearm o Topical Lidocaine 4% cream applied to wound bed prior to debridement (In Clinic Only). Wound #14 Right Upper Arm o Topical Lidocaine 4% cream applied to wound bed prior to debridement (In Clinic Only). Wound #6 Right,Lateral Malleolus o  Topical Lidocaine 4% cream applied to wound bed prior to debridement (In Clinic Only). Wound #8 Right,Lateral [name] o Topical Lidocaine 4% cream applied to wound bed prior to debridement (In Clinic Only). NESHAWN, AIRD ([id number]) Wound #9 Left Metatarsal head first o Topical Lidocaine 4% cream applied to wound bed prior to debridement (In Clinic Only). Primary Wound Dressing Wound #10 Right,Anterior Lower Leg o Silver Collagen Wound #6 Right,Lateral Malleolus o Santyl Ointment Wound #8 Right,Lateral [name] o Santyl Ointment Wound #13 Right Forearm o Silver Collagen Wound #14 Right Upper Arm o Silver Collagen Wound #9 Left Metatarsal head first o Silver Collagen Secondary Dressing Wound #10 Right,Anterior Lower Leg o ABD pad o ABD pad o Kerlix and Coban Wound #6 Right,Lateral Malleolus o ABD pad o ABD pad o Kerlix and Coban Wound #8 Right,Lateral [name] o ABD pad Wound #9 Left Metatarsal head first o Boardered Foam Dressing Wound #11 Right,Posterior Lower Leg o ABD pad o Kerlix and Coban Wound #13 Right Forearm o Boardered Foam Dressing Wound #14 Right Upper Arm o Boardered Foam Dressing Dressing Change Frequency Wound #10 Right,Anterior Lower Leg o Change Dressing Monday, Wednesday, Friday Wound #11 Right,Posterior Lower Leg o Change Dressing Monday, Wednesday, Friday JASPREET, HOLLINGS ([id number]) Wound #12 Right Hand - 1st Digit o Change Dressing Monday, Wednesday, Friday Wound #13 Right Forearm o Change Dressing Monday, Wednesday, Friday Wound #14 Right Upper Arm o Change Dressing Monday, Wednesday, Friday Wound #6 Right,Lateral Malleolus o Change Dressing Monday, Wednesday, Friday Wound #8 Right,Lateral [name] o Change Dressing Monday, Wednesday, Friday Wound #9 Left Metatarsal head first o Change Dressing Monday, Wednesday, Friday Follow-up Appointments Wound #10 Right,Anterior Lower Leg o Return  Appointment in 2 weeks. Wound #11 Right,Posterior Lower Leg o Return Appointment in 2 weeks. Wound #12 Right Hand - 1st Digit o Return Appointment in 2 weeks. Wound #13 Right Forearm o Return Appointment in 2 weeks. Wound #14 Right Upper Arm o Return Appointment in 2 weeks. Wound #6 Right,Lateral Malleolus o Return Appointment in 2 weeks. Wound #8 Right,Lateral [name] o Return Appointment in 2 weeks. Wound #9 Left Metatarsal head first o Return  Appointment in 2 weeks. Edema Control Wound #10 Right,Anterior Lower Leg o Kerlix and Coban - Right Lower Extremity o Elevate legs to the level of the heart and pump ankles as often as possible Wound #6 Right,Lateral Malleolus o Kerlix and Coban - Right Lower Extremity o Elevate legs to the level of the heart and pump ankles as often as possible Wound #8 Right,Lateral [name] o Kerlix and Coban - Right Lower Extremity o Elevate legs to the level of the heart and pump ankles as often as possible RHEA, THRUN ([id number]) Home Health Wound #10 [place] Nurse may visit PRN to address patientos wound care needs. o FACE TO FACE ENCOUNTER: MEDICARE and MEDICAID PATIENTS: I certify that this patient is under my care and that I had a face-to-face encounter that meets the physician face-to-face encounter requirements with this patient on this date. The encounter with the patient was in whole or in part for the following MEDICAL CONDITION: (primary reason for [place]) MEDICAL NECESSITY: I certify, that based on my findings, NURSING services are a medically necessary home health service. HOME BOUND STATUS: I certify that my clinical findings support that this patient is homebound (i.e., Due to illness or injury, pt requires aid of supportive devices such as crutches, cane, wheelchairs, walkers, the use of special transportation or the assistance of  another person to leave their place of residence. There is a normal inability to leave the home and doing so requires considerable and taxing effort. Other absences are for medical reasons / religious services and are infrequent or of short duration when for other reasons). o If current dressing causes regression in wound condition, may D/C ordered dressing product/s and apply Normal Saline Moist Dressing daily until next [place] / Other MD appointment. [place] of regression in wound condition at [phone]. o Please direct any NON-WOUND related issues/requests for orders to patient's Primary Care Physician Wound #6 [place] Nurse may visit PRN to address patientos wound care needs. o FACE TO FACE ENCOUNTER: MEDICARE and MEDICAID PATIENTS: I certify that this patient is under my care and that I had a face-to-face encounter that meets the physician face-to-face encounter requirements with this patient on this date. The encounter with the patient was in whole or in part for the following MEDICAL CONDITION: (primary reason for [place]) MEDICAL NECESSITY: I certify, that based on my findings, NURSING services are a medically necessary home health service. HOME BOUND STATUS: I certify that my clinical findings support that this patient is homebound (i.e., Due to illness or injury, pt requires aid of supportive devices such as crutches, cane, wheelchairs, walkers, the use of special transportation or the assistance of another person to leave their place of residence. There is a normal inability to leave the home and doing so requires considerable and taxing effort. Other absences are for medical reasons / religious services and are infrequent or of short duration when for other reasons). o If current dressing causes regression in wound condition, may D/C ordered dressing product/s and  apply Normal Saline Moist Dressing daily until next [place] / Other MD appointment. [place] of regression in wound condition at [phone]. o Please direct any NON-WOUND related issues/requests for orders to patient's Primary Care Physician Wound #8 [place] Visits o Home Health Nurse may visit PRN to address  patientos wound care needs. o FACE TO FACE ENCOUNTER: MEDICARE and MEDICAID PATIENTS: I certify that this patient is under my care and that I had a face-to-face encounter that meets the physician face-to-face encounter requirements with this patient on this date. The encounter with the patient was in whole or in part for the following MEDICAL CONDITION: (primary reason for [place]) MEDICAL NECESSITY: I certify, that based on my findings, NURSING services are a medically necessary home health service. HOME BOUND STATUS: I certify that my clinical findings support that this patient is homebound (i.e., Due to illness or injury, pt requires aid of supportive devices such as crutches, cane, wheelchairs, walkers, the use of special transportation or the assistance of another person to leave their place of residence. There is a normal inability to leave the home and doing so requires considerable and taxing effort. Other absences are for medical reasons / religious services and are infrequent or of short duration when for other reasons). o If current dressing causes regression in wound condition, may D/C ordered dressing product/s and apply Normal Saline Moist Dressing daily until next [place] / Other MD appointment. [place] of regression in wound condition at [phone]. o Please direct any NON-WOUND related issues/requests for orders to patient's Primary Care Physician Wound #9 Left Metatarsal head first o [place] Nurse may visit PRN  to address patientos wound care needs. BODHI, MORADI ([id number]) o FACE TO FACE ENCOUNTER: MEDICARE and MEDICAID PATIENTS: I certify that this patient is under my care and that I had a face-to-face encounter that meets the physician face-to-face encounter requirements with this patient on this date. The encounter with the patient was in whole or in part for the following MEDICAL CONDITION: (primary reason for [place]) MEDICAL NECESSITY: I certify, that based on my findings, NURSING services are a medically necessary home health service. HOME BOUND STATUS: I certify that my clinical findings support that this patient is homebound (i.e., Due to illness or injury, pt requires aid of supportive devices such as crutches, cane, wheelchairs, walkers, the use of special transportation or the assistance of another person to leave their place of residence. There is a normal inability to leave the home and doing so requires considerable and taxing effort. Other absences are for medical reasons / religious services and are infrequent or of short duration when for other reasons). o If current dressing causes regression in wound condition, may D/C ordered dressing product/s and apply Normal Saline Moist Dressing daily until next [place] / Other MD appointment. [place] of regression in wound condition at [phone]. o Please direct any NON-WOUND related issues/requests for orders to patient's Primary Care Physician Electronic Signature(s) Signed: 07/15/2017 5:14:04 PM By: Roger Shelter Signed: 07/15/2017 6:13:38 PM By: Linton Ham MD Entered By: Roger Shelter on 07/15/2017 09:12:31 Borel, [name] ([id number]) -------------------------------------------------------------------------------- Problem List Details Patient Name: [name] Date of Service: 07/15/2017 8:30 AM Medical Record Number: [id number] Patient Account Number:  [id number] Date of Birth/Sex: 22-Apr-1940 (76 y.o. 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 04/08/2017 Yes Yes of skin Status Complications Interventions Medical Improving all nonviable adherent debris over the small wound bed continued Decision debridement and Making : continued Santyl L97.511 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 limited 04/08/2017 Yes Yes to breakdown of skin Status Complications Interventions Medical No still a very nonviable wound surface still debridement and Decision change Santyl Making : I25.5 Ischemic cardiomyopathy 04/08/2017 No Yes 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 ([id number]) S41.111D Laceration without foreign body of right upper arm, 07/15/2017 Yes Yes subsequent encounter Status Complications Interventions Medical No wounds covered in a necrotic surface using silver collagen Decision change Making : Inactive Problems Resolved Problems Electronic Signature(s) Signed: 07/15/2017  6:13:38 PM By: Linton Ham MD Entered By: Linton Ham on 07/15/2017 18:07:40 Masci, [name] ([id number]) -------------------------------------------------------------------------------- Progress Note Details Patient Name: [name] Date of Service: 07/15/2017 8:30 AM Medical Record Number: [id number] Patient Account Number: [id number] Date of Birth/Sex: 1940/03/24 (76 y.o. M) Treating RN: Roger Shelter Primary Care Provider: Cyndi Bender Other Clinician: Referring Provider: Cyndi Bender Treating Provider/Extender: Tito Dine in Treatment: 14 Subjective History of Present Illness (HPI) 04/08/17; this is a complex 77 year old man referred here from [facility]. He had been referred there for bilateral lower extremity edema with ulcer formation predominantly on the right calf but also the right [name]. He had been receiving Unna boots bilaterally. The history here is long. He is not a diabetic however ICU looking through late 2018 he was worked up for chronic headaches, elevated inflammatory markers including C-reactive protein and ESR. He went on to actually have a left temporal artery biopsy wasn't that was negative.he received about 6 weeks of high-dose prednisone 60 mg with improvement in his inflammatory markers. He was admitted to hospital in late November with ventricular tachycardia syncope. He has known ischemic cardiomyopathy. He was admitted in the hospital in mid December. Apparently this was precipitated by a syncopal spell falling out of his scooter while at [place]. There was ventricular arrhythmia. He has an implantable defibrillator and echocardiogram showed severe LV dysfunction with an EF of 20% and valvular regurgitations including mild AR, moderate MR. There was no stenosis. He ruled in for a non-ST elevation MI in the setting of V. tach.his wife states that sometime during this hospitalization she noted multiple areas of  skin change on the right lower calf which became evident just after he left the hospital. He was back in hospital in February with acute renal failure hyponatremia. This responded to fluid resuscitation.. Interestingly I can't see much description of his right leg at that point in time.he was followed by Dr. Nehemiah Massed of dermatology for the necrotic wounds on his right leg. Apparently a biopsy was planned at one point but not done although in some notes that suggests it was. I cannot see these results area He was noted to have a lot of edema. Was treated with bilateral Unna boots edges really helped with the swelling they have been using Bactroban to small open areas predominantly on the right anterior lower leg His history is complicated  by the fact that he has rheumatoid arthritis followed by rheumatology. He is followed by neurology for disabling headaches. At one point this was felt to be giant cell arteritis although a left temporal biopsy was apparently negative. He was given a prolonged course of prednisone at 60 mg which managed his sedimentation rates but apparently did not prove improve the headaches. This is been tapered to off on by rheumatology on 03/13/17 Vascular had plans to do a venous reflux workup as well as arterial studies in May. They also wanted to get him a lymphedema pump. As mentioned he's been using bacitracin under Unna boot wraps to both lower legs 04/15/17; the patient arrives with most of his wounds improved. These are small punched out wounds. Most of them remaining ones are on the right anterior calf with the most problematic over the right lateral malleolus. There are no new areas. The symptom complex or potential symptom complex we are dealing with his chronic disabling headaches with inflammatory markers not responsive to prednisone and with a negative temporal biopsy, lower extremity weakness, skin ulcerations just on the right leg. We have managed to get his  arterial studies moved up to April 30. He has a rheumatology consult at [facility] in June. He has seen dermatology locally, rheumatology locally, neurology locally. 04/22/17; small punched out areas on the right leg anteriorly posteriorly. Most of these appear to have closed over. Some of them have eschar over the surface. The most problematic area appears to be over the right lateral malleolus. We've been using prisma to all of this. He has arterial studies on April 30 and a rheumatology consult at [facility] on June 28 04/29/17; most of the small punched out areas on the right leg posteriorly are closed. He has 2 or 3 openings anteriorly but most of these appear to be on the way to closing. Still problematically over the right lateral malleolus and right lateral [name] with almost ischemic-looking eschar. His arterial studies that I ordered are due to be done next week on the 30th so we should have them available for our next visit hopefully. He also saw a rheumatologist at [facility] and according to the patient he did 8 vials of blood. Finally he has scaling rash on his left [name] and what looks to be at [facility] area on the left anterior leg 05/06/17; most of the small punched out areas on the right leg posteriorly and anteriorly are closed. He still has one small one Goehring, Dimitri E. ([id number]) anteriorly one over the right lateral malleolus and one over the right lateral [name]. He had his arterial studies they didn't seem to do waveform analysis not exactly sure why however in any case is ABIs were noncompressible bilaterally. They did provide TBIs although looking at the pressures it appears that his TBIs are quite normal. I therefore went ahead and debrided the area over the right lateral malleolus and the right lateral [name] 05/13/17; most of the small punched out areas on the right leg posteriorly and anteriorly are closed. He continues to have problematic areas over the right lateral malleolus and the right  lateral [name]. He still requiring aggressive debridement of these 2 wounds using silver collagen I reviewed the note from rheumatology I don't think they came up with a specific diagnosis although he is known to have seropositive RA. They did a panel of lab work when I was able to see his his AMA was negative, anti-smooth muscle antibodies negative antineutrophil cytoplasmic antibodies negative,Liv/kia type  1 negative. Serum C3 and C4 were negative. I don't see his muscle enzymes specifically. He was referred back to his local rheumatologist for management of his known rheumatoid arthritis.the patient states he still feels weak and fatigued. He states he has numbness in both feet and apparently is known to have neuropathy 05/20/17; the patient continues to have a difficult problem on the right lateral malleolus and the right lateral [name]. Both of these wounds have no viable surface even with attempts at debridement. He has a new wound on the left plantar metatarsal head which looks more like a superficial diabetic pressure related injury then part of this underlying issue he has. Most of the rest of the wounds on his legs look satisfactory. Mostly on the right calf.reviewed his arterial studies which showed noncompressible vessels bilaterally but the TBIs were quite normal. 05/27/17; no real improvement in the right lateral malleolus and right lateral [name]. In fact the right lateral [name] is now on bone. I gave him doxycycline empirically last week it appears that he developed photosensitivity was 75 the son in a tractor. I'll not give him any more of this. This is predominantly on his face and dorsal forearms and hands. I given this more as an anti- inflammatory. I'm going to have him seen by vascular surgery. His TBIs that he had done previously ordered by Dr. Brigitte Pulse were in the normal range They never did full arterial studies on him. he now has small punched out wounds on the right lateral ankle  and right lateral [name]. I doubt these are ischemic however I would like a review of his macrovascular status. He does describe some pain at night. I'll reduce the compression from 3 layer to 2 layer and I'm not convinced that this is a macrovascular issue however I want to make sure. We've been using silver alginate 06/03/17; the patient's x-rays that I ordered last time of his right ankle and right lateral [name] did not show definite osteomyelitis. We've been using silver alginate. The wounds are not making any progress. The area on the plantar left first metatarsal head however appears to be better. The patient complains of weakness that is more of the fatigue. He says if he's walking his head will fall onto his chest and that his legs literally gave out on him. He did see neurology in the past however that was at a time where his workup was for temporal arteritis and headaches. 06/10/17; the patient is making no progress with the areas on the right lateral [name] and right lateral malleolus. In fact the area on the [name] probes to bone. X-ray did not show osteomyelitis. He went and saw vein and vascular on 06/04/17 he was felt to have significant reflux in the left greater saphenous vein over this is not in the area we are most concerned about. He could be offered ablation. He was not felt to have venous reflux noted in the right lower extremity. He was felt to have some degree of lymphedema and he was felt to be a candidate for compression pumps. Finally a diagnostic arteriogram was suggested which is really what I'm most interested in. The patient has wife wanted time to think about this, I think they were confused about interacting between venous and arterial discussions. I think it would be probably well worth going through the angiogram. Culture I did have the deeper area on the right lateral [name] showed a few Enterococcus faecalis. I would like to start her on amoxicillin  which they will start  today for one-week 500 3 times a day 06/17/17; the patient still has punched out areas on the right lateral [name] and right lateral malleolus. There is not a viable surface here. We have been using Santyl. He also has an area on the plantar aspect of his left first metatarsal head this also seems to be better 06/24/17; patient's angiogram as on 07/06/17; still has punched out areas on the right lateral [name] and right lateral malleolus to which we've been applying Santyl-based dressings. He also has a more superficial area on the left plantar first metatarsal head, using collagen here 07/08/17; the patient had his angiogram. This perineal artery had a 70-80% stenosis. Similarly the posterior tibial artery also was diseased. Furthermore down the posterior tibial artery in the distal segment was a short stenosis of 80%. His major vessels in his thigh and only minor irregularity. He had percutaneous angioplasty of the proximal right peroneal artery. Also angioplasty of the tibial peroneal trunk and proximal posterior tibial artery as well as the mid to distal segment of the posterior tibial artery. He handled this remarkably well. The patient arrived with a new wound on his right anterior calf which I think is the reopening of one of the original small open areas 07/15/17; the areas on his right anterior calf is new. He has a new threatened area on the right tibia more superiorly which is not open yet but makes me wonder whether this is going to reopen as well. His original wounds on the right lateral [name] and Schoenfelder, Nicklos E. ([id number]) right lateral malleolus are somewhat better in terms of surface Objective Constitutional Sitting or standing Blood Pressure is within target range for patient.. Pulse regular and within target range for patient.[name] Respirations regular, non-labored and within target range.. Temperature is normal and within the target range for the patient.[name] appears in no distress. Vitals  Time Taken: 8:32 AM, Height: 68 in, Temperature: 97.6 F, Pulse: 70 bpm, Respiratory Rate: 16 breaths/min, Blood Pressure: 100/45 mmHg. General Notes: wound exam; new open area on the right distal tibia and a threatened area on the more proximal tibia. The area on the right lateral malleolus and right lateral [name] still required debridement. Surface is somewhat better. The area of the first metatarsal head on the left looks like it's improving this looks like a pressure area we've been using collagen Multiple areas of last skin laceration on the right arm from a fall last week required debridement. Necrotic surface debris removed hemostasis with direct pressure Integumentary (Hair, Skin) Wound #10 status is Open. Original cause of wound was Gradually Appeared. The wound is located on the Right,Anterior Lower Leg. The wound measures 0.6cm length x 0.5cm width x 0.1cm depth; 0.236cm^2 area and 0.024cm^3 volume. There is Fat Layer (Subcutaneous Tissue) Exposed exposed. There is no tunneling or undermining noted. There is a medium amount of serous drainage noted. The wound margin is distinct with the outline attached to the wound base. There is no granulation within the wound bed. There is a large (67-100%) amount of necrotic tissue within the wound bed including Adherent Slough. The periwound skin appearance did not exhibit: Callus, Crepitus, Excoriation, Induration, Rash, Scarring, Dry/Scaly, Maceration, Atrophie Blanche, Cyanosis, Ecchymosis, Hemosiderin Staining, Mottled, Pallor, Rubor, Erythema. Periwound temperature was noted as No Abnormality. The periwound has tenderness on palpation. Wound #11 status is Open. Original cause of wound was Not Known. The wound is located on the Right,Posterior Lower Leg. The wound measures 1cm  length x 0.6cm width x 0.1cm depth; 0.471cm^2 area and 0.047cm^3 volume. There is no tunneling or undermining noted. There is a medium amount of sanguinous drainage noted.  The wound margin is flat and intact. There is large (67-100%) pink granulation within the wound bed. There is no necrotic tissue within the wound bed. The periwound skin appearance did not exhibit: Callus, Crepitus, Excoriation, Induration, Rash, Scarring, Dry/Scaly, Maceration, Atrophie Blanche, Cyanosis, Ecchymosis, Hemosiderin Staining, Mottled, Pallor, Rubor, Erythema. Periwound temperature was noted as No Abnormality. The periwound has tenderness on palpation. Wound #12 status is Open. Original cause of wound was Trauma. The wound is located on the Right Hand - 1st Digit. The wound measures 1.2cm length x 1cm width x 0.1cm depth; 0.942cm^2 area and 0.094cm^3 volume. There is no tunneling or undermining noted. There is a small amount of sanguinous drainage noted. The wound margin is flat and intact. There is large (67-100%) pink granulation within the wound bed. There is a small (1-33%) amount of necrotic tissue within the wound bed including Adherent Slough. The periwound skin appearance did not exhibit: Callus, Crepitus, Excoriation, Induration, Rash, Scarring, Dry/Scaly, Maceration, Atrophie Blanche, Cyanosis, Ecchymosis, Hemosiderin Staining, Mottled, Pallor, Rubor, Erythema. Periwound temperature was noted as No Abnormality. The periwound has tenderness on palpation. Wound #13 status is Open. Original cause of wound was Trauma. The wound is located on the Right Forearm. The wound measures 1.5cm length x 2.1cm width x 0.1cm depth; 2.474cm^2 area and 0.247cm^3 volume. There is no tunneling or undermining noted. There is a medium amount of sanguinous drainage noted. The wound margin is flat and intact. There is small (1-33%) pink granulation within the wound bed. There is a large (67-100%) amount of necrotic tissue within the wound bed including Adherent Slough. The periwound skin appearance did not exhibit: Callus, Crepitus, Excoriation, Induration, Puder, Iman E. ([id number]) Rash,  Scarring, Dry/Scaly, Maceration, Atrophie Blanche, Cyanosis, Ecchymosis, Hemosiderin Staining, Mottled, Pallor, Rubor, Erythema. Periwound temperature was noted as No Abnormality. The periwound has tenderness on palpation. Wound #14 status is Open. Original cause of wound was Trauma. The wound is located on the Right Upper Arm. The wound measures 1.1cm length x 2.3cm width x 0.1cm depth; 1.987cm^2 area and 0.199cm^3 volume. There is no tunneling or undermining noted. There is a medium amount of serous drainage noted. The wound margin is flat and intact. There is medium (34-66%) pink granulation within the wound bed. There is a medium (34-66%) amount of necrotic tissue within the wound bed including Adherent Slough. The periwound skin appearance did not exhibit: Callus, Crepitus, Excoriation, Induration, Rash, Scarring, Dry/Scaly, Maceration, Atrophie Blanche, Cyanosis, Ecchymosis, Hemosiderin Staining, Mottled, Pallor, Rubor, Erythema. Periwound temperature was noted as No Abnormality. The periwound has tenderness on palpation. Wound #6 status is Open. Original cause of wound was Gradually Appeared. The wound is located on the Right,Lateral Malleolus. The wound measures 0.6cm length x 0.5cm width x 0.3cm depth; 0.236cm^2 area and 0.071cm^3 volume. There is no tunneling or undermining noted. There is a large amount of serous drainage noted. The wound margin is distinct with the outline attached to the wound base. There is medium (34-66%) red, pink granulation within the wound bed. There is a medium (34-66%) amount of necrotic tissue within the wound bed including Adherent Slough. The periwound skin appearance exhibited: Callus. The periwound skin appearance did not exhibit: Crepitus, Excoriation, Induration, Rash, Scarring, Dry/Scaly, Maceration, Atrophie Blanche, Cyanosis, Ecchymosis, Hemosiderin Staining, Mottled, Pallor, Rubor, Erythema. Periwound temperature was noted as No Abnormality. The  periwound  has tenderness on palpation. Wound #8 status is Open. Original cause of wound was Gradually Appeared. The wound is located on the Right,Lateral [name]. The wound measures 0.7cm length x 0.6cm width x 0.4cm depth; 0.33cm^2 area and 0.132cm^3 volume. There is bone and Fat Layer (Subcutaneous Tissue) Exposed exposed. There is no tunneling or undermining noted. There is a large amount of serous drainage noted. The wound margin is distinct with the outline attached to the wound base. There is no granulation within the wound bed. There is a large (67-100%) amount of necrotic tissue within the wound bed including Adherent Slough. The periwound skin appearance exhibited: Callus, Maceration. Periwound temperature was noted as No Abnormality. The periwound has tenderness on palpation. Wound #9 status is Open. Original cause of wound was Gradually Appeared. The wound is located on the Left Metatarsal head first. The wound measures 0.3cm length x 0.4cm width x 0.1cm depth; 0.094cm^2 area and 0.009cm^3 volume. There is Fat Layer (Subcutaneous Tissue) Exposed exposed. There is no tunneling or undermining noted. There is a medium amount of serous drainage noted. The wound margin is flat and intact. There is no granulation within the wound bed. There is a large (67-100%) amount of necrotic tissue within the wound bed including Adherent Slough. The periwound skin appearance exhibited: Callus, Maceration. The periwound skin appearance did not exhibit: Crepitus, Excoriation, Induration, Rash, Scarring, Dry/Scaly, Atrophie Blanche, Cyanosis, Ecchymosis, Hemosiderin Staining, Mottled, Pallor, Rubor, Erythema. Periwound temperature was noted as No Abnormality. 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 ([id number]) Wound #10 Pre-procedure diagnosis of Wound #10 is a Venous Leg Ulcer located on the Right,Anterior Lower Leg .Severity of Tissue Pre Debridement is: Fat layer exposed. There was a Excisional Skin/Subcutaneous Tissue Debridement with a total area of 0.3 sq cm performed by Ricard Dillon, MD. With the following instrument(s): Curette to remove Viable and Non- Viable tissue/material. Material removed includes Subcutaneous Tissue, Slough, and Biofilm after achieving pain control using Other (lidocaine 4%). No specimens were taken. A time out was conducted at 09:00, prior to the start of the procedure. A Minimum amount of bleeding was controlled with Pressure. The procedure was tolerated well with a pain level of 0 throughout and a pain level of 0 following the procedure. Patient s Level of Consciousness post procedure was recorded as Awake and Alert. Post Debridement Measurements: 0.6cm length x 0.5cm width x 0.1cm depth; 0.024cm^3 volume. Character of Wound/Ulcer Post Debridement is stable. Severity of Tissue Post Debridement is: Fat layer exposed. Post procedure Diagnosis Wound #10: Same as Pre-Procedure Wound #13 Pre-procedure diagnosis of Wound #13 is a Skin Tear located on the Right Forearm . There was a Excisional Skin/Subcutaneous Tissue Debridement with a total area of 3.15 sq cm performed by Ricard Dillon, MD. With the following instrument(s): Curette to remove Viable and Non-Viable tissue/material. Material removed includes Subcutaneous Tissue, Slough, and Biofilm after achieving pain control using Other (lidocaine 4%). No specimens were taken. A time out was conducted at 09:00, prior to the start of the procedure. A Minimum amount of bleeding  was controlled with Pressure. The procedure was tolerated well  with a pain level of 0 throughout and a pain level of 0 following the procedure. Patient s Level of Consciousness post procedure was recorded as Awake and Alert. Post Debridement Measurements: 1.5cm length x 2.1cm width x 0.1cm depth; 0.247cm^3 volume. Character of Wound/Ulcer Post Debridement is stable. Post procedure Diagnosis Wound #13: Same as Pre-Procedure Wound #14 Pre-procedure diagnosis of Wound #14 is a Skin Tear located on the Right Upper Arm . There was a Excisional Skin/Subcutaneous Tissue Debridement with a total area of 2.53 sq cm performed by Ricard Dillon, MD. With the following instrument(s): Curette to remove Viable and Non-Viable tissue/material. Material removed includes Subcutaneous Tissue, Slough, and Biofilm after achieving pain control using Other (lidocaine 4%). No specimens were taken. A time out was conducted at 09:00, prior to the start of the procedure. A Minimum amount of bleeding was controlled with Pressure. The procedure was tolerated well with a pain level of 0 throughout and a pain level of 0 following the procedure. Patient s Level of Consciousness post procedure was recorded as Awake and Alert. Post Debridement Measurements: 1.1cm length x 2.3cm width x 0.1cm depth; 0.199cm^3 volume. Character of Wound/Ulcer Post Debridement is stable. 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 There was a Excisional Skin/Subcutaneous Tissue Debridement with a total area of 0.42 sq cm performed by Ricard Dillon, MD. With the following instrument(s): Curette to remove Viable and Non- Viable tissue/material. Material removed includes Subcutaneous Tissue, Slough, and Biofilm after achieving pain control using Other  (lidocaine 4%). No specimens were taken. A time out was conducted at 09:00, prior to the start of the procedure. A Minimum amount of bleeding was controlled with Pressure. The procedure was tolerated well with a pain level of 0 throughout and a pain level of 0 following the procedure. Patient s Level of Consciousness post procedure was recorded as Awake and Alert. Post Debridement Measurements: 0.7cm length x 0.6cm width x 0.4cm depth; 0.132cm^3 volume. Character of Wound/Ulcer Post Debridement is stable. Severity of Tissue Post Debridement is: Fat layer exposed. Post procedure Diagnosis Wound #8: Same as Pre-Procedure Plan Julson, MONTRAY KLIEBERT. ([id number]) Wound Cleansing: Wound #10 Right,Anterior Lower Leg: Clean wound with Normal Saline. Wound #11 Right,Posterior Lower Leg: Clean wound with Normal Saline. Wound #12 Right Hand - 1st Digit: Clean wound with Normal Saline. Wound #13 Right Forearm: Clean wound with Normal Saline. Wound #14 Right Upper Arm: Clean wound with Normal Saline. Wound #6 Right,Lateral Malleolus: Clean wound with Normal Saline. Wound #8 Right,Lateral [name]: Clean wound with Normal Saline. Wound #9 Left Metatarsal head first: Clean wound with Normal Saline. Anesthetic (add to Medication List): Wound #10 Right,Anterior Lower Leg: Topical Lidocaine 4% cream applied to wound bed prior to debridement (In Clinic Only). Wound #11 Right,Posterior Lower Leg: Topical Lidocaine 4% cream applied to wound bed prior to debridement (In Clinic Only). Wound #12 Right Hand - 1st Digit: Topical Lidocaine 4% cream applied to wound bed prior to debridement (In Clinic Only). Wound #13 Right Forearm: Topical Lidocaine 4% cream applied to wound bed prior to debridement (In Clinic Only). Wound #14 Right Upper Arm: Topical Lidocaine 4% cream applied to wound bed prior to debridement (In Clinic Only). Wound #6 Right,Lateral Malleolus: Topical Lidocaine 4% cream applied to wound bed  prior to debridement (In Clinic Only). Wound #8 Right,Lateral [name]: Topical Lidocaine 4%  cream applied to wound bed prior to debridement (In Clinic Only). Wound #9 Left Metatarsal head first: Topical Lidocaine 4% cream applied to wound bed prior to debridement (In Clinic Only). Primary Wound Dressing: Wound #10 Right,Anterior Lower Leg: Silver Collagen Wound #6 Right,Lateral Malleolus: Santyl Ointment Wound #8 Right,Lateral [name]: Santyl Ointment Wound #13 Right Forearm: Silver Collagen Wound #14 Right Upper Arm: Silver Collagen Wound #9 Left Metatarsal head first: Silver Collagen Secondary Dressing: Wound #10 Right,Anterior Lower Leg: ABD pad ABD pad Kerlix and Coban Wound #6 Right,Lateral Malleolus: ABD pad ABD pad Kerlix and Coban Wound #8 Right,Lateral [name]: ABD pad Wound #9 Left Metatarsal head first: GREYSYN, VANDERBERG ([id number]) Boardered Foam Dressing Wound #11 Right,Posterior Lower Leg: ABD pad Kerlix and Coban Wound #13 Right Forearm: Boardered Foam Dressing Wound #14 Right Upper Arm: Boardered Foam Dressing Dressing Change Frequency: Wound #10 Right,Anterior Lower Leg: Change Dressing Monday, Wednesday, Friday Wound #11 Right,Posterior Lower Leg: Change Dressing Monday, Wednesday, Friday Wound #12 Right Hand - 1st Digit: Change Dressing Monday, Wednesday, Friday Wound #13 Right Forearm: Change Dressing Monday, Wednesday, Friday Wound #14 Right Upper Arm: Change Dressing Monday, Wednesday, Friday Wound #6 Right,Lateral Malleolus: Change Dressing Monday, Wednesday, Friday Wound #8 Right,Lateral [name]: Change Dressing Monday, Wednesday, Friday Wound #9 Left Metatarsal head first: Change Dressing Monday, Wednesday, Friday Follow-up Appointments: Wound #10 Right,Anterior Lower Leg: Return Appointment in 2 weeks. Wound #11 Right,Posterior Lower Leg: Return Appointment in 2 weeks. Wound #12 Right Hand - 1st Digit: Return Appointment in 2 weeks. Wound  #13 Right Forearm: Return Appointment in 2 weeks. Wound #14 Right Upper Arm: Return Appointment in 2 weeks. Wound #6 Right,Lateral Malleolus: Return Appointment in 2 weeks. Wound #8 Right,Lateral [name]: Return Appointment in 2 weeks. Wound #9 Left Metatarsal head first: Return Appointment in 2 weeks. Edema Control: Wound #10 Right,Anterior Lower Leg: Kerlix and Coban - Right Lower Extremity Elevate legs to the level of the heart and pump ankles as often as possible Wound #6 Right,Lateral Malleolus: Kerlix and Coban - Right Lower Extremity Elevate legs to the level of the heart and pump ankles as often as possible Wound #8 Right,Lateral [name]: Kerlix and Coban - Right Lower Extremity Elevate legs to the level of the heart and pump ankles as often as possible Home Health: Wound #10 Right,Anterior Lower Leg: [place] Nurse may visit PRN to address patient s wound care needs. FACE TO FACE ENCOUNTER: MEDICARE and MEDICAID PATIENTS: I certify that this patient is under my care and that I had a face-to-face encounter that meets the physician face-to-face encounter requirements with this patient on this date. The encounter with the patient was in whole or in part for the following MEDICAL CONDITION: (primary reason for [place]) MEDICAL NECESSITY: I certify, that based on my findings, NURSING services are a medically necessary home ALLYN, BERTONI ([id number]) health service. HOME BOUND STATUS: I certify that my clinical findings support that this patient is homebound (i.e., Due to illness or injury, pt requires aid of supportive devices such as crutches, cane, wheelchairs, walkers, the use of special transportation or the assistance of another person to leave their place of residence. There is a normal inability to leave the home and doing so requires considerable and taxing effort. Other absences are for medical reasons / religious services and are  infrequent or of short duration when for other reasons). If current dressing causes regression in wound condition, may D/C ordered dressing product/s and apply Normal Saline Moist  Dressing daily until next [place] / Other MD appointment. [place] of regression in wound condition at [phone]. Please direct any NON-WOUND related issues/requests for orders to patient's Primary Care Physician Wound #6 Right,Lateral Malleolus: [place] Nurse may visit PRN to address patient s wound care needs. FACE TO FACE ENCOUNTER: MEDICARE and MEDICAID PATIENTS: I certify that this patient is under my care and that I had a face-to-face encounter that meets the physician face-to-face encounter requirements with this patient on this date. The encounter with the patient was in whole or in part for the following MEDICAL CONDITION: (primary reason for [place]) MEDICAL NECESSITY: I certify, that based on my findings, NURSING services are a medically necessary home health service. HOME BOUND STATUS: I certify that my clinical findings support that this patient is homebound (i.e., Due to illness or injury, pt requires aid of supportive devices such as crutches, cane, wheelchairs, walkers, the use of special transportation or the assistance of another person to leave their place of residence. There is a normal inability to leave the home and doing so requires considerable and taxing effort. Other absences are for medical reasons / religious services and are infrequent or of short duration when for other reasons). If current dressing causes regression in wound condition, may D/C ordered dressing product/s and apply Normal Saline Moist Dressing daily until next [place] / Other MD appointment. [place] of regression in wound condition at [phone]. Please direct any NON-WOUND related issues/requests for orders to  patient's Primary Care Physician Wound #8 Right,Lateral [name]: [place] Nurse may visit PRN to address patient s wound care needs. FACE TO FACE ENCOUNTER: MEDICARE and MEDICAID PATIENTS: I certify that this patient is under my care and that I had a face-to-face encounter that meets the physician face-to-face encounter requirements with this patient on this date. The encounter with the patient was in whole or in part for the following MEDICAL CONDITION: (primary reason for [place]) MEDICAL NECESSITY: I certify, that based on my findings, NURSING services are a medically necessary home health service. HOME BOUND STATUS: I certify that my clinical findings support that this patient is homebound (i.e., Due to illness or injury, pt requires aid of supportive devices such as crutches, cane, wheelchairs, walkers, the use of special transportation or the assistance of another person to leave their place of residence. There is a normal inability to leave the home and doing so requires considerable and taxing effort. Other absences are for medical reasons / religious services and are infrequent or of short duration when for other reasons). If current dressing causes regression in wound condition, may D/C ordered dressing product/s and apply Normal Saline Moist Dressing daily until next [place] / Other MD appointment. [place] of regression in wound condition at [phone]. Please direct any NON-WOUND related issues/requests for orders to patient's Primary Care Physician Wound #9 Left Metatarsal head first: [place] Nurse may visit PRN to address patient s wound care needs. FACE TO FACE ENCOUNTER: MEDICARE and MEDICAID PATIENTS: I certify that this patient is under my care and that I had a face-to-face encounter that meets the physician face-to-face encounter requirements with this patient on this date.  The encounter with the patient was in whole or in part for the following MEDICAL CONDITION: (primary reason for [place]) MEDICAL NECESSITY: I certify, that based on my  findings, NURSING services are a medically necessary home health service. HOME BOUND STATUS: I certify that my clinical findings support that this patient is homebound (i.e., Due to illness or injury, pt requires aid of supportive devices such as crutches, cane, wheelchairs, walkers, the use of special transportation or the assistance of another person to leave their place of residence. There is a normal inability to leave the home and doing so requires considerable and taxing effort. Other absences are for medical reasons / religious services and are infrequent or of short duration when for other reasons). If current dressing causes regression in wound condition, may D/C ordered dressing product/s and apply Normal Saline Moist Dressing daily until next [place] / Other MD appointment. [place] of regression in wound condition at [phone]. 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 04/08/2017 Status: No change Complications: still a very nonviable wound surface Interventions: still debridement and Santyl Atherosclerosis of native arteries of right leg with  ulceration of other part of [name] 07/08/2017 Status: Improving Complications: fortunately vein and vascular elected to do an angiogram on this patient Interventions: patient has been revascularized with angioplasties of the proximal right peroneal artery and the tibial peroneal trunk and proximal posterior tibial artery Laceration without foreign body of right upper arm, subsequent encounter 07/15/2017 Status: No change Complications: wounds covered in a necrotic surface Interventions: using silver collagen #1using Santyl ointment to the 2 wounds on the right lateral [name] and right lateral malleolus #2 silver collagen to all open areas on his arms after debridement #3 on somewhat concerned about the reopening's on the right tibia. He also has a threatened area more superiorly on the tibial area. This is always look like some form of vasculopathy to me. I'll review his angiogram to see if there is any source of emboli just to the right leg Electronic Signature(s) Signed: 07/15/2017 6:13:38 PM By: Linton Ham MD Entered By: Linton Ham on 07/15/2017 18:11:24 Fuster, [name] ([id number]) -------------------------------------------------------------------------------- SuperBill Details Patient Name: [name] Date of Service: 07/15/2017 Medical Record Number: [id number] Patient Account Number: [id number] Date of Birth/Sex: [birth year] (76 y.o. 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 S41.111D Laceration without foreign body of right upper arm, subsequent encounter Facility Procedures CPT4 Code Description: [id number] 11042 - DEB SUBQ TISSUE 20 SQ CM/< ICD-10 Diagnosis Description L97.511 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

## 2017-07-17 DIAGNOSIS — I2510 Atherosclerotic heart disease of native coronary artery without angina pectoris: Secondary | ICD-10-CM | POA: Diagnosis not present

## 2017-07-17 DIAGNOSIS — I110 Hypertensive heart disease with heart failure: Secondary | ICD-10-CM | POA: Diagnosis not present

## 2017-07-17 DIAGNOSIS — I5023 Acute on chronic systolic (congestive) heart failure: Secondary | ICD-10-CM | POA: Diagnosis not present

## 2017-07-17 DIAGNOSIS — L97311 Non-pressure chronic ulcer of right ankle limited to breakdown of skin: Secondary | ICD-10-CM | POA: Diagnosis not present

## 2017-07-17 DIAGNOSIS — L97511 Non-pressure chronic ulcer of other part of right foot limited to breakdown of skin: Secondary | ICD-10-CM | POA: Diagnosis not present

## 2017-07-17 DIAGNOSIS — I255 Ischemic cardiomyopathy: Secondary | ICD-10-CM | POA: Diagnosis not present

## 2017-07-20 DIAGNOSIS — I255 Ischemic cardiomyopathy: Secondary | ICD-10-CM | POA: Diagnosis not present

## 2017-07-20 DIAGNOSIS — I5022 Chronic systolic (congestive) heart failure: Secondary | ICD-10-CM | POA: Diagnosis not present

## 2017-07-20 DIAGNOSIS — I214 Non-ST elevation (NSTEMI) myocardial infarction: Secondary | ICD-10-CM | POA: Diagnosis not present

## 2017-07-20 DIAGNOSIS — L97311 Non-pressure chronic ulcer of right ankle limited to breakdown of skin: Secondary | ICD-10-CM | POA: Diagnosis not present

## 2017-07-20 DIAGNOSIS — E782 Mixed hyperlipidemia: Secondary | ICD-10-CM | POA: Diagnosis not present

## 2017-07-20 DIAGNOSIS — I5023 Acute on chronic systolic (congestive) heart failure: Secondary | ICD-10-CM | POA: Diagnosis not present

## 2017-07-20 DIAGNOSIS — I110 Hypertensive heart disease with heart failure: Secondary | ICD-10-CM | POA: Diagnosis not present

## 2017-07-20 DIAGNOSIS — I472 Ventricular tachycardia: Secondary | ICD-10-CM | POA: Diagnosis not present

## 2017-07-20 DIAGNOSIS — L97511 Non-pressure chronic ulcer of other part of right foot limited to breakdown of skin: Secondary | ICD-10-CM | POA: Diagnosis not present

## 2017-07-20 DIAGNOSIS — I10 Essential (primary) hypertension: Secondary | ICD-10-CM | POA: Diagnosis not present

## 2017-07-20 DIAGNOSIS — I2510 Atherosclerotic heart disease of native coronary artery without angina pectoris: Secondary | ICD-10-CM | POA: Diagnosis not present

## 2017-07-22 ENCOUNTER — Encounter: Payer: Medicare Other | Admitting: Internal Medicine

## 2017-07-22 DIAGNOSIS — S51811A Laceration without foreign body of right forearm, initial encounter: Secondary | ICD-10-CM | POA: Diagnosis not present

## 2017-07-22 DIAGNOSIS — S81801A Unspecified open wound, right lower leg, initial encounter: Secondary | ICD-10-CM | POA: Diagnosis not present

## 2017-07-22 DIAGNOSIS — S41111A Laceration without foreign body of right upper arm, initial encounter: Secondary | ICD-10-CM | POA: Diagnosis not present

## 2017-07-22 DIAGNOSIS — I70235 Atherosclerosis of native arteries of right leg with ulceration of other part of foot: Secondary | ICD-10-CM | POA: Diagnosis not present

## 2017-07-22 DIAGNOSIS — S91301A Unspecified open wound, right foot, initial encounter: Secondary | ICD-10-CM | POA: Diagnosis not present

## 2017-07-22 DIAGNOSIS — L97511 Non-pressure chronic ulcer of other part of right foot limited to breakdown of skin: Secondary | ICD-10-CM | POA: Diagnosis not present

## 2017-07-22 DIAGNOSIS — I255 Ischemic cardiomyopathy: Secondary | ICD-10-CM | POA: Diagnosis not present

## 2017-07-22 DIAGNOSIS — S91302A Unspecified open wound, left foot, initial encounter: Secondary | ICD-10-CM | POA: Diagnosis not present

## 2017-07-22 DIAGNOSIS — S91001A Unspecified open wound, right ankle, initial encounter: Secondary | ICD-10-CM | POA: Diagnosis not present

## 2017-07-22 DIAGNOSIS — S61001A Unspecified open wound of right thumb without damage to nail, initial encounter: Secondary | ICD-10-CM | POA: Diagnosis not present

## 2017-07-23 DIAGNOSIS — L98499 Non-pressure chronic ulcer of skin of other sites with unspecified severity: Secondary | ICD-10-CM | POA: Diagnosis not present

## 2017-07-23 DIAGNOSIS — R234 Changes in skin texture: Secondary | ICD-10-CM | POA: Diagnosis not present

## 2017-07-23 DIAGNOSIS — L97819 Non-pressure chronic ulcer of other part of right lower leg with unspecified severity: Secondary | ICD-10-CM | POA: Diagnosis not present

## 2017-07-23 DIAGNOSIS — L570 Actinic keratosis: Secondary | ICD-10-CM | POA: Diagnosis not present

## 2017-07-23 DIAGNOSIS — L820 Inflamed seborrheic keratosis: Secondary | ICD-10-CM | POA: Diagnosis not present

## 2017-07-23 NOTE — Progress Notes (Signed)
Jermaine, [name] ([id number]) Visit Report for 07/22/2017 Arrival Information Details Patient Name: [name], Jermaine Date of Service: 07/22/2017 8:45 AM Medical Record Number: [id number] Patient Account Number: [id number] Date of Birth/Sex: Nov 01, 1940 (76 y.o. M) Treating RN: Ahmed Prima Primary Care Dalis Beers: Cyndi Bender Other Clinician: Referring Jahiem Franzoni: Cyndi Bender Treating Keyshla Tunison/Extender: Tito Dine in Treatment: 15 Visit Information History Since Last Visit All ordered tests and consults were completed: No Patient Arrived: Cane Added or deleted any medications: No Arrival Time: 09:02 Any new allergies or adverse reactions: No Accompanied By: spouse Had a fall or experienced change in No Transfer Assistance: EasyPivot Patient activities of daily living that may affect Lift risk of falls: Patient Identification Verified: Yes Signs or symptoms of abuse/neglect since last visito No Secondary Verification Process Yes Hospitalized since last visit: No Completed: Implantable device outside of the clinic excluding No Patient Requires Transmission-Based No cellular tissue based products placed in the center Precautions: since last visit: Patient Has Alerts: Yes Has Dressing in Place as Prescribed: Yes Patient Alerts: R ABI >220 Has Compression in Place as Prescribed: Yes L ABI >220 Pain Present Now: No Electronic Signature(s) Signed: 07/22/2017 5:07:56 PM By: Alric Quan Entered By: Alric Quan on 07/22/2017 09:03:21 [place], [name] ([id number]) -------------------------------------------------------------------------------- Clinic Level of Care Assessment Details Patient Name: [name] Date of Service: 07/22/2017 8:45 AM Medical Record Number: [id number] Patient Account Number: [id number] Date of Birth/Sex: Jan 01, 1941 (76 y.o. M) Treating RN: Roger Shelter Primary Care [name]: Cyndi Bender Other  Clinician: Referring Markham Dumlao: Cyndi Bender Treating Torin Whisner/Extender: Tito Dine in Treatment: 15 Clinic Level of Care Assessment Items TOOL 4 Quantity Score X - Use when only an EandM is performed on FOLLOW-UP visit 1 0 ASSESSMENTS - Nursing Assessment / Reassessment X - Reassessment of Co-morbidities (includes updates in patient status) 1 10 X- 1 5 Reassessment of Adherence to Treatment Plan ASSESSMENTS - Wound and Skin Assessment / Reassessment []  - Simple Wound Assessment / Reassessment - one wound 0 X- 6 5 Complex Wound Assessment / Reassessment - multiple wounds []  - 0 Dermatologic / Skin Assessment (not related to wound area) ASSESSMENTS - Focused Assessment []  - Circumferential Edema Measurements - multi extremities 0 []  - 0 Nutritional Assessment / Counseling / Intervention []  - 0 Lower Extremity Assessment (monofilament, tuning fork, pulses) []  - 0 Peripheral Arterial Disease Assessment (using hand held doppler) ASSESSMENTS - Ostomy and/or Continence Assessment and Care []  - Incontinence Assessment and Management 0 []  - 0 Ostomy Care Assessment and Management (repouching, etc.) PROCESS - Coordination of Care []  - Simple Patient / Family Education for ongoing care 0 X- 1 20 Complex (extensive) Patient / Family Education for ongoing care []  - 0 Staff obtains Programmer, systems, Records, Test Results / Process Orders []  - 0 Staff telephones HHA, Nursing Homes / Clarify orders / etc []  - 0 Routine Transfer to another Facility (non-emergent condition) []  - 0 Routine Hospital Admission (non-emergent condition) []  - 0 New Admissions / Biomedical engineer / Ordering NPWT, Apligraf, etc. []  - 0 Emergency Hospital Admission (emergent condition) []  - 0 Simple Discharge Coordination Jermaine, Avory E. ([id number]) X- 1 15 Complex (extensive) Discharge Coordination PROCESS - Special Needs []  - Pediatric / Minor Patient Management 0 []  - 0 Isolation Patient  Management []  - 0 Hearing / Language / Visual special needs []  - 0 Assessment of Community assistance (transportation, D/C planning, etc.) []  - 0 Additional assistance / Altered mentation []  - 0 Support Surface(s) Assessment (bed,  cushion, seat, etc.) INTERVENTIONS - Wound Cleansing / Measurement []  - Simple Wound Cleansing - one wound 0 X- 6 5 Complex Wound Cleansing - multiple wounds X- 1 5 Wound Imaging (photographs - any number of wounds) []  - 0 Wound Tracing (instead of photographs) []  - 0 Simple Wound Measurement - one wound X- 6 5 Complex Wound Measurement - multiple wounds INTERVENTIONS - Wound Dressings X - Small Wound Dressing one or multiple wounds 6 10 []  - 0 Medium Wound Dressing one or multiple wounds []  - 0 Large Wound Dressing one or multiple wounds []  - 0 Application of Medications - topical []  - 0 Application of Medications - injection INTERVENTIONS - Miscellaneous []  - External ear exam 0 []  - 0 Specimen Collection (cultures, biopsies, blood, body fluids, etc.) []  - 0 Specimen(s) / Culture(s) sent or taken to Lab for analysis []  - 0 Patient Transfer (multiple staff / Civil Service fast streamer / Similar devices) []  - 0 Simple Staple / Suture removal (25 or less) []  - 0 Complex Staple / Suture removal (26 or more) []  - 0 Hypo / Hyperglycemic Management (close monitor of Blood Glucose) []  - 0 Ankle / Brachial Index (ABI) - do not check if billed separately X- 1 5 Vital Signs [name], Deray E. ([id number]) Has the patient been seen at the hospital within the last three years: Yes Total Score: 210 Level Of Care: New/Established - Level 5 Electronic Signature(s) Signed: 07/22/2017 4:58:57 PM By: Roger Shelter Entered By: Roger Shelter on 07/22/2017 09:59:35 [place], [name] ([id number]) -------------------------------------------------------------------------------- Encounter Discharge Information Details Patient Name: [name] Date of  Service: 07/22/2017 8:45 AM Medical Record Number: [id number] Patient Account Number: [id number] Date of Birth/Sex: Oct 10, 1940 (76 y.o. M) Treating RN: Montey Hora Primary Care Drew Lips: Cyndi Bender Other Clinician: Referring Ziyonna Christner: Cyndi Bender Treating Sammantha Mehlhaff/Extender: Tito Dine in Treatment: 15 Encounter Discharge Information Items Discharge Condition: Stable Ambulatory Status: Cane Discharge Destination: Home Transportation: Private Auto Accompanied By: spouse Schedule Follow-up Appointment: Yes Clinical Summary of Care: Electronic Signature(s) Signed: 07/22/2017 12:14:19 PM By: Montey Hora Entered By: Montey Hora on 07/22/2017 12:14:19 [place], [name] ([id number]) -------------------------------------------------------------------------------- Lower Extremity Assessment Details Patient Name: [name] Date of Service: 07/22/2017 8:45 AM Medical Record Number: [id number] Patient Account Number: [id number] Date of Birth/Sex: [birth year] (76 y.o. M) Treating RN: Ahmed Prima Primary Care Mavery Milling: Cyndi Bender Other Clinician: Referring Harlow Carrizales: Cyndi Bender Treating Vondell Babers/Extender: Tito Dine in Treatment: 15 Vascular Assessment Pulses: Dorsalis Pedis Palpable: [Left:Yes] [Right:Yes] Posterior Tibial Extremity colors, hair growth, and conditions: Extremity Color: [Left:Normal] [Right:Hyperpigmented] Temperature of Extremity: [Left:Warm] [Right:Warm] Capillary Refill: [Left:< 3 seconds] [Right:< 3 seconds] Toe Nail Assessment Left: Right: Thick: Yes Yes Discolored: Yes Yes Deformed: Yes Yes Improper Length and Hygiene: Yes Yes Electronic Signature(s) Signed: 07/22/2017 5:07:56 PM By: Alric Quan Entered By: Alric Quan on 07/22/2017 09:27:22 [place], [name] ([id number]) -------------------------------------------------------------------------------- Multi Wound Chart Details Patient Name:  [name] Date of Service: 07/22/2017 8:45 AM Medical Record Number: [id number] Patient Account Number: [id number] Date of Birth/Sex: 23-Aug-1940 (76 y.o. M) Treating RN: Roger Shelter Primary Care Woods Gangemi: Cyndi Bender Other Clinician: Referring Adie Vilar: Cyndi Bender Treating Chantele Corado/Extender: Tito Dine in Treatment: 15 Vital Signs Height(in): 68 Pulse(bpm): 64 Weight(lbs): Blood Pressure(mmHg): 101/56 Body Mass Index(BMI): Temperature(F): 97.6 Respiratory Rate 16 (breaths/min): Photos: [10:No Photos] [11:No Photos] [12:No Photos] Wound Location: [10:Right Lower Leg - Anterior] [11:Right Lower Leg - Posterior] [12:Right Hand - 1st Digit] Wounding Event: [10:Gradually Appeared] [11:Not Known] [12:Trauma]  Primary Etiology: [10:Venous Leg Ulcer] [11:Venous Leg Ulcer] [12:Skin Tear] Comorbid History: [10:Congestive Heart Failure, Coronary Artery Disease, Hypertension, Myocardial Infarction, Osteoarthritis] [11:Congestive Heart Failure, Coronary Artery Disease, Hypertension, Myocardial Infarction, Osteoarthritis] [12:Congestive Heart  Failure, Coronary Artery Disease, Hypertension, Myocardial Infarction, Osteoarthritis] Date Acquired: [10:07/08/2017] [11:07/15/2017] [12:07/10/2017] Weeks of Treatment: [10:2] [11:1] [12:1] Wound Status: [10:Open] [11:Open] [12:Open] Measurements L x W x D [10:0.7x0.4x0.1] [11:1.1x0.7x0.1] [12:1.2x0.9x0.1] (cm) Area (cm) : [10:0.22] [11:0.605] [12:0.848] Volume (cm) : [10:0.022] [11:0.06] [12:0.085] % Reduction in Area: [10:-40.10%] [11:-28.50%] [12:10.00%] % Reduction in Volume: [10:56.00%] [11:-27.70%] [12:9.60%] Classification: [10:Full Thickness Without Exposed Support Structures] [11:Partial Thickness] [12:Full Thickness Without Exposed Support Structures] Exudate Amount: [10:Medium] [11:Medium] [12:Small] Exudate Type: [10:Serous] [11:Serous] [12:Serosanguineous] Exudate Color: [10:amber] [11:amber] [12:red,  brown] Wound Margin: [10:Distinct, outline attached] [11:Flat and Intact] [12:Flat and Intact] Granulation Amount: [10:None Present (0%)] [11:None Present (0%)] [12:None Present (0%)] Granulation Quality: [10:N/A] [11:N/A] [12:N/A] Necrotic Amount: [10:Large (67-100%)] [11:Large (67-100%)] [12:Large (67-100%)] Necrotic Tissue: [10:Adherent Slough] [11:Adherent Slough] [12:Eschar, Adherent Slough] Exposed Structures: [10:Fat Layer (Subcutaneous Tissue) Exposed: Yes Fascia: No Tendon: No Muscle: No Joint: No Bone: No] [11:Fascia: No Fat Layer (Subcutaneous Tissue) Exposed: No Tendon: No Muscle: No Joint: No Bone: No] [12:Fascia: No Fat Layer (Subcutaneous Tissue)  Exposed: No Tendon: No Muscle: No Joint: No Bone: No] Epithelialization: [10:None] [11:Medium (34-66%)] [12:Medium (34-66%)] Periwound Skin Texture: [10:Excoriation: No Induration: No] [11:Excoriation: No Induration: No] [12:Excoriation: No Induration: No] Callus: No Callus: No Callus: No Crepitus: No Crepitus: No Crepitus: No Rash: No Rash: No Rash: No Scarring: No Scarring: No Scarring: No Periwound Skin Moisture: Maceration: No Maceration: No Maceration: No Dry/Scaly: No Dry/Scaly: No Dry/Scaly: No Periwound Skin Color: Atrophie Blanche: No Atrophie Blanche: No Atrophie Blanche: No Cyanosis: No Cyanosis: No Cyanosis: No Ecchymosis: No Ecchymosis: No Ecchymosis: No Erythema: No Erythema: No Erythema: No Hemosiderin Staining: No Hemosiderin Staining: No Hemosiderin Staining: No Mottled: No Mottled: No Mottled: No Pallor: No Pallor: No Pallor: No Rubor: No Rubor: No Rubor: No Temperature: No Abnormality No Abnormality No Abnormality Tenderness on Palpation: Yes Yes Yes Wound Preparation: Ulcer Cleansing: Other: soap Ulcer Cleansing: Other: soap Ulcer Cleansing: and water and water Rinsed/Irrigated with Saline Topical Anesthetic Applied: Topical Anesthetic Applied: Topical Anesthetic  Applied: Other: lidocaine 4% Other: lidocaine 4% Other: lidocaine 4% Wound Number: [id number]  Photos: No Photos No Photos No Photos Wound Location: Right Forearm Right Upper Arm Right Lower Leg - Anterior, Proximal Wounding Event: Trauma Trauma Gradually Appeared Primary Etiology: Skin Tear Skin Tear Venous Leg Ulcer Comorbid History: Congestive Heart Failure, Congestive Heart Failure, Congestive Heart Failure, Coronary Artery Disease, Coronary Artery Disease, Coronary Artery Disease, Hypertension, Myocardial Hypertension, Myocardial Hypertension, Myocardial Infarction, Osteoarthritis Infarction, Osteoarthritis Infarction, Osteoarthritis Date Acquired: 07/10/2017 07/10/2017 07/22/2017 Weeks of Treatment: 1 1 0 Wound Status: Open Open Open Measurements L x W x D 0.8x1.7x0.1 0.1x0.1x0.1 0.8x0.5x0.1 (cm) Area (cm) : 1.068 0.008 0.314 Volume (cm) : 0.107 0.001 0.031 % Reduction in Area: 56.80% 99.60% N/A % Reduction in Volume: 56.70% 99.50% N/A Classification: Full Thickness Without Full Thickness Without Full Thickness Without Exposed Support Structures Exposed Support Structures Exposed Support Structures Exudate Amount: Large Small Large Exudate Type: Sanguinous Sanguinous Serous Exudate Color: red red amber Wound Margin: Flat and Intact Flat and Intact Distinct, outline attached Granulation Amount: Large (67-100%) Large (67-100%) None Present (0%) Granulation Quality: Red Red N/A Necrotic Amount: Small (1-33%) None Present (0%) Large (67-100%) Necrotic Tissue: [place] Exposed Structures: Fascia: No Fascia: No Fascia: No Fat Layer (Subcutaneous  Fat Layer (Subcutaneous Fat Layer (Subcutaneous Tissue) Exposed: No Tissue) Exposed: No Tissue) Exposed: No Tendon: No Tendon: No Tendon: No Muscle: No Muscle: No Muscle: No Joint: No Joint: No Joint: No Bone: No Bone: No Bone: No Duggan, Aladdin E. ([id number]) Epithelialization: None Small (1-33%)  None Periwound Skin Texture: Excoriation: No Excoriation: No No Abnormalities Noted Induration: No Induration: No Callus: No Callus: No Crepitus: No Crepitus: No Rash: No Rash: No Scarring: No Scarring: No Periwound Skin Moisture: Maceration: No Maceration: No No Abnormalities Noted Dry/Scaly: No Dry/Scaly: No Periwound Skin Color: Atrophie Blanche: No Atrophie Blanche: No No Abnormalities Noted Cyanosis: No Cyanosis: No Ecchymosis: No Ecchymosis: No Erythema: No Erythema: No Hemosiderin Staining: No Hemosiderin Staining: No Mottled: No Mottled: No Pallor: No Pallor: No Rubor: No Rubor: No Temperature: No Abnormality No Abnormality No Abnormality Tenderness on Palpation: Yes Yes Yes Wound Preparation: Ulcer Cleansing: Ulcer Cleansing: Ulcer Cleansing: Rinsed/Irrigated with Saline Rinsed/Irrigated with Saline Rinsed/Irrigated with Saline, Other: soap and water Topical Anesthetic Applied: Topical Anesthetic Applied: Other: lidocaine 4% Other: lidocaine 4% Topical Anesthetic Applied: Other: lidocaine 4% Wound Number: [id number]  Photos: No Photos No Photos No Photos Wound Location: Right Malleolus - Lateral Right [name] - Lateral Left Metatarsal head first Wounding Event: Gradually Appeared Gradually Appeared Gradually Appeared Primary Etiology: Arterial Insufficiency Ulcer Arterial Insufficiency Ulcer Pressure Ulcer Comorbid History: Congestive Heart Failure, Congestive Heart Failure, Congestive Heart Failure, Coronary Artery Disease, Coronary Artery Disease, Coronary Artery Disease, Hypertension, Myocardial Hypertension, Myocardial Hypertension, Myocardial Infarction, Osteoarthritis Infarction, Osteoarthritis Infarction, Osteoarthritis Date Acquired: 03/23/2017 04/29/2017 05/06/2017 Weeks of Treatment: 15 12 9  Wound Status: Open Open Open Measurements L x W x D 0.4x0.4x0.3 0.7x0.7x0.4 0.1x0.1x0.1 (cm) Area (cm) : 0.126 0.385 0.008 Volume (cm) : 0.038 0.154  0.001 % Reduction in Area: -306.50% -442.30% 95.90% % Reduction in Volume: -1166.70% -2100.00% 95.00% Classification: Full Thickness Without Full Thickness With Exposed Category/Stage II Exposed Support Structures Support Structures Exudate Amount: Large Large Medium Exudate Type: Serous Serous Serous Exudate Color: amber amber amber Wound Margin: Distinct, outline attached Distinct, outline attached Flat and Intact Granulation Amount: None Present (0%) None Present (0%) None Present (0%) Granulation Quality: N/A N/A N/A Necrotic Amount: Large (67-100%) Large (67-100%) Large (67-100%) Necrotic Tissue: Adherent [place] Exposed Structures: Fascia: No Fat Layer (Subcutaneous Fascia: No Fat Layer (Subcutaneous Tissue) Exposed: Yes Fat Layer (Subcutaneous Tissue) Exposed: No Bone: Yes Tissue) Exposed: No Tendon: No Fascia: No Tendon: No Wieneke, Burley E. ([id number]) Muscle: No Tendon: No Muscle: No Joint: No Muscle: No Joint: No Bone: No Joint: No Bone: No Epithelialization: None None None Periwound Skin Texture: Callus: Yes Callus: Yes Callus: Yes Excoriation: No Excoriation: No Induration: No Induration: No Crepitus: No Crepitus: No Rash: No Rash: No Scarring: No Scarring: No Periwound Skin Moisture: Maceration: No Maceration: Yes Maceration: Yes Dry/Scaly: No Dry/Scaly: No Periwound Skin Color: Atrophie Blanche: No No Abnormalities Noted Atrophie Blanche: No Cyanosis: No Cyanosis: No Ecchymosis: No Ecchymosis: No Erythema: No Erythema: No Hemosiderin Staining: No Hemosiderin Staining: No Mottled: No Mottled: No Pallor: No Pallor: No Rubor: No Rubor: No Temperature: No Abnormality No Abnormality No Abnormality Tenderness on Palpation: Yes Yes No Wound Preparation: Ulcer Cleansing: Other: soap Ulcer Cleansing: Other: soap Ulcer Cleansing: and water and water Rinsed/Irrigated with Saline Topical Anesthetic  Applied: Topical Anesthetic Applied: Topical Anesthetic Applied: Other: lidocaine 4% Other: lidocaine 4% Other: lidocaine 4% Treatment Notes Electronic Signature(s) Signed: 07/22/2017 4:58:57 PM By: Roger Shelter Entered By: Roger Shelter on 07/22/2017 09:49:53  LEVIN, DAGOSTINO ([id number]) -------------------------------------------------------------------------------- Multi-Disciplinary Care Plan Details Patient Name: DRAYCEN, LEICHTER Date of Service: 07/22/2017 8:45 AM Medical Record Number: [id number] Patient Account Number: [id number] Date of Birth/Sex: [birth year] (76 y.o. M) Treating RN: Roger Shelter Primary Care Saniya Tranchina: Cyndi Bender Other Clinician: Referring Defne Gerling: Cyndi Bender Treating Ellanor Feuerstein/Extender: Tito Dine in Treatment: 15 Active Inactive ` Abuse / Safety / Falls / Self Care Management Nursing Diagnoses: History of Falls Goals: Patient will remain injury free related to falls Date Initiated: 04/08/2017 Target Resolution Date: 05/08/2017 Goal Status: Active Interventions: Assess fall risk on admission and as needed Notes: ` Orientation to the Wound Care Program Nursing Diagnoses: Knowledge deficit related to the wound healing center program Goals: Patient/caregiver will verbalize understanding of the [place] Program Date Initiated: 04/08/2017 Target Resolution Date: 05/08/2017 Goal Status: Active Interventions: Provide education on orientation to the wound center Notes: ` Soft Tissue Infection Nursing Diagnoses: Impaired tissue integrity Potential for infection: soft tissue Goals: Patient will remain free of wound infection Date Initiated: 04/08/2017 Target Resolution Date: 05/08/2017 Goal Status: Active ZYLEN, WENIG ([id number]) Interventions: Assess signs and symptoms of infection every visit Notes: ` Wound/Skin Impairment Nursing Diagnoses: Impaired tissue integrity Goals: Ulcer/skin breakdown  will heal within 14 weeks Date Initiated: 04/08/2017 Target Resolution Date: 07/20/2017 Goal Status: Active Interventions: Assess patient/caregiver ability to perform ulcer/skin care regimen upon admission and as needed Provide education on ulcer and skin care Treatment Activities: Topical wound management initiated : 04/08/2017 Notes: Electronic Signature(s) Signed: 07/22/2017 4:58:57 PM By: Roger Shelter Entered By: Roger Shelter on 07/22/2017 09:49:23 Rodgers, [name] ([id number]) -------------------------------------------------------------------------------- Pain Assessment Details Patient Name: [name] Date of Service: 07/22/2017 8:45 AM Medical Record Number: [id number] Patient Account Number: [id number] Date of Birth/Sex: December 20, 1940 (76 y.o. M) Treating RN: Ahmed Prima Primary Care Arnell Slivinski: Cyndi Bender Other Clinician: Referring Tyashia Morrisette: Cyndi Bender Treating Loucille Takach/Extender: Tito Dine in Treatment: 15 Active Problems Location of Pain Severity and Description of Pain Patient Has Paino No Site Locations Pain Management and Medication Current Pain Management: Electronic Signature(s) Signed: 07/22/2017 5:07:56 PM By: Alric Quan Entered By: Alric Quan on 07/22/2017 09:03:25 Siedlecki, [name] ([id number]) -------------------------------------------------------------------------------- Patient/Caregiver Education Details Patient Name: [name] Date of Service: 07/22/2017 8:45 AM Medical Record Number: [id number] Patient Account Number: [id number] Date of Birth/Gender: February 22, 1940 (76 y.o. M) Treating RN: Montey Hora Primary Care Physician: Cyndi Bender Other Clinician: Referring Physician: Cyndi Bender Treating Physician/Extender: Tito Dine in Treatment: 15 Education Assessment Education Provided To: Patient Education Topics Provided Venous: Handouts: Other: leg elevation Methods:  Explain/Verbal Responses: State content correctly Electronic Signature(s) Signed: 07/22/2017 5:29:35 PM By: Montey Hora Entered By: Montey Hora on 07/22/2017 12:14:35 Carrera, [name] ([id number]) -------------------------------------------------------------------------------- Wound Assessment Details Patient Name: [name] Date of Service: 07/22/2017 8:45 AM Medical Record Number: [id number] Patient Account Number: [id number] Date of Birth/Sex: 02/04/1940 (76 y.o. M) Treating RN: Ahmed Prima Primary Care Daylee Delahoz: Cyndi Bender Other Clinician: Referring Ricahrd Schwager: Cyndi Bender Treating Stevey Stapleton/Extender: Tito Dine in Treatment: 15 Wound Status Wound Number: [id number] Primary Venous Leg Ulcer Etiology: Wound Location: Right Lower Leg - Anterior Wound Open Wounding Event: Gradually Appeared Status: Date Acquired: 07/08/2017 Comorbid Congestive Heart Failure, Coronary Artery Weeks Of Treatment: 2 History: Disease, Hypertension, Myocardial Infarction, Clustered Wound: No Osteoarthritis Photos Photo Uploaded By: Alric Quan on 07/22/2017 17:07:06 Wound Measurements Length: (cm) 0.7 Width: (cm) 0.4 Depth: (cm) 0.1 Area: (cm) 0.22 Volume: (cm) 0.022 % Reduction in Area: -40.1% %  Reduction in Volume: 56% Epithelialization: None Tunneling: No Undermining: No Wound Description Full Thickness Without Exposed Support Classification: Structures Wound Margin: Distinct, outline attached Exudate Medium Amount: Exudate Type: Serous Exudate Color: amber Foul Odor After Cleansing: No Slough/Fibrino Yes Wound Bed Granulation Amount: None Present (0%) Exposed Structure Necrotic Amount: Large (67-100%) Fascia Exposed: No Necrotic Quality: Adherent [place]ough Fat Layer (Subcutaneous Tissue) Exposed: Yes Tendon Exposed: No Muscle Exposed: No Joint Exposed: No Bone Exposed: No Kotas, Kenniel E. ([id number]) Periwound Skin Texture Texture  Color No Abnormalities Noted: No No Abnormalities Noted: No Callus: No Atrophie Blanche: No Crepitus: No Cyanosis: No Excoriation: No Ecchymosis: No Induration: No Erythema: No Rash: No Hemosiderin Staining: No Scarring: No Mottled: No Pallor: No Moisture Rubor: No No Abnormalities Noted: No Dry / Scaly: No Temperature / Pain Maceration: No Temperature: No Abnormality Tenderness on Palpation: Yes Wound Preparation Ulcer Cleansing: Other: soap and water, Topical Anesthetic Applied: Other: lidocaine 4%, Electronic Signature(s) Signed: 07/22/2017 5:07:56 PM By: Alric Quan Entered By: Alric Quan on 07/22/2017 09:15:20 Abelson, [name] ([id number]) -------------------------------------------------------------------------------- Wound Assessment Details Patient Name: [name] Date of Service: 07/22/2017 8:45 AM Medical Record Number: [id number] Patient Account Number: [id number] Date of Birth/Sex: 05-20-1940 (76 y.o. M) Treating RN: Ahmed Prima Primary Care Kodie Kishi: Cyndi Bender Other Clinician: Referring Elia Nunley: Cyndi Bender Treating Ayvin Lipinski/Extender: Tito Dine in Treatment: 15 Wound Status Wound Number: 11 Primary Venous Leg Ulcer Etiology: Wound Location: Right Lower Leg - Posterior Wound Open Wounding Event: Not Known Status: Date Acquired: 07/15/2017 Comorbid Congestive Heart Failure, Coronary Artery Weeks Of Treatment: 1 History: Disease, Hypertension, Myocardial Infarction, Clustered Wound: No Osteoarthritis Photos Photo Uploaded By: Alric Quan on 07/22/2017 17:06:12 Wound Measurements Length: (cm) 1.1 Width: (cm) 0.7 Depth: (cm) 0.1 Area: (cm) 0.605 Volume: (cm) 0.06 % Reduction in Area: -28.5% % Reduction in Volume: -27.7% Epithelialization: Medium (34-66%) Tunneling: No Undermining: No Wound Description Classification: Partial Thickness Wound Margin: Flat and Intact Exudate Amount:  Medium Exudate Type: Serous Exudate Color: amber Foul Odor After Cleansing: No Slough/Fibrino No Wound Bed Granulation Amount: None Present (0%) Exposed Structure Necrotic Amount: Large (67-100%) Fascia Exposed: No Necrotic Quality: Adherent Slough Fat Layer (Subcutaneous Tissue) Exposed: No Tendon Exposed: No Muscle Exposed: No Joint Exposed: No Bone Exposed: No Periwound Skin Texture Formoso, Javiel E. ([id number]) Texture Color No Abnormalities Noted: No No Abnormalities Noted: No Callus: No Atrophie Blanche: No Crepitus: No Cyanosis: No Excoriation: No Ecchymosis: No Induration: No Erythema: No Rash: No Hemosiderin Staining: No Scarring: No Mottled: No Pallor: No Moisture Rubor: No No Abnormalities Noted: No Dry / Scaly: No Temperature / Pain Maceration: No Temperature: No Abnormality Tenderness on Palpation: Yes Wound Preparation Ulcer Cleansing: Other: soap and water, Topical Anesthetic Applied: Other: lidocaine 4%, Electronic Signature(s) Signed: 07/22/2017 5:07:56 PM By: Alric Quan Entered By: Alric Quan on 07/22/2017 09:18:54 Moehle, [name] ([id number]) -------------------------------------------------------------------------------- Wound Assessment Details Patient Name: [name] Date of Service: 07/22/2017 8:45 AM Medical Record Number: [id number] Patient Account Number: [id number] Date of Birth/Sex: [birth year] (76 y.o. M) Treating RN: Roger Shelter Primary Care Dionicio Shelnutt: Cyndi Bender Other Clinician: Referring Vinicius Brockman: Cyndi Bender Treating Ricki Vanhandel/Extender: Tito Dine in Treatment: 15 Wound Status Wound Number: 12 Primary Skin Tear Etiology: Wound Location: Right Hand - 1st Digit Wound Healed - Epithelialized Wounding Event: Trauma Status: Date Acquired: 07/10/2017 Comorbid Congestive Heart Failure, Coronary Artery Weeks Of Treatment: 1 History: Disease, Hypertension, Myocardial  Infarction, Clustered Wound: No Osteoarthritis Photos Photo Uploaded By: Alric Quan on 07/22/2017  17:03:52 Wound Measurements Length: (cm) 0 % Re Width: (cm) 0 % Re Depth: (cm) 0 Epit Area: (cm) 0 Tun Volume: (cm) 0 Und duction in Area: 100% duction in Volume: 100% helialization: Medium (34-66%) neling: No ermining: No Wound Description Full Thickness Without Exposed Support Classification: Structures Wound Margin: Flat and Intact Exudate Small Amount: Exudate Type: Serosanguineous Exudate Color: red, brown Foul Odor After Cleansing: No Slough/Fibrino Yes Wound Bed Granulation Amount: None Present (0%) Exposed Structure Necrotic Amount: Large (67-100%) Fascia Exposed: No Necrotic Quality: Eschar, Adherent Slough Fat Layer (Subcutaneous Tissue) Exposed: No Tendon Exposed: No Muscle Exposed: No Joint Exposed: No Bone Exposed: No Beebe, Mahamud E. ([id number]) Periwound Skin Texture Texture Color No Abnormalities Noted: No No Abnormalities Noted: No Callus: No Atrophie Blanche: No Crepitus: No Cyanosis: No Excoriation: No Ecchymosis: No Induration: No Erythema: No Rash: No Hemosiderin Staining: No Scarring: No Mottled: No Pallor: No Moisture Rubor: No No Abnormalities Noted: No Dry / Scaly: No Temperature / Pain Maceration: No Temperature: No Abnormality Tenderness on Palpation: Yes Wound Preparation Ulcer Cleansing: Rinsed/Irrigated with Saline Topical Anesthetic Applied: Other: lidocaine 4%, Electronic Signature(s) Signed: 07/22/2017 4:58:57 PM By: Roger Shelter Entered By: Roger Shelter on 07/22/2017 09:58:34 Babiarz, [name] ([id number]) -------------------------------------------------------------------------------- Wound Assessment Details Patient Name: [name] Date of Service: 07/22/2017 8:45 AM Medical Record Number: [id number] Patient Account Number: [id number] Date of Birth/Sex: 1940/11/23 (76 y.o.  M) Treating RN: Ahmed Prima Primary Care Ramie Palladino: Cyndi Bender Other Clinician: Referring Tinya Cadogan: Cyndi Bender Treating Shyloh Krinke/Extender: Tito Dine in Treatment: 15 Wound Status Wound Number: 13 Primary Skin Tear Etiology: Wound Location: Right Forearm Wound Open Wounding Event: Trauma Status: Date Acquired: 07/10/2017 Comorbid Congestive Heart Failure, Coronary Artery Weeks Of Treatment: 1 History: Disease, Hypertension, Myocardial Infarction, Clustered Wound: No Osteoarthritis Photos Photo Uploaded By: Alric Quan on 07/22/2017 17:03:52 Wound Measurements Length: (cm) 0.8 Width: (cm) 1.7 Depth: (cm) 0.1 Area: (cm) 1.068 Volume: (cm) 0.107 % Reduction in Area: 56.8% % Reduction in Volume: 56.7% Epithelialization: None Tunneling: No Undermining: No Wound Description Full Thickness Without Exposed Support Classification: Structures Wound Margin: Flat and Intact Exudate Large Amount: Exudate Type: Sanguinous Exudate Color: red Foul Odor After Cleansing: No Slough/Fibrino Yes Wound Bed Granulation Amount: Large (67-100%) Exposed Structure Granulation Quality: Red Fascia Exposed: No Necrotic Amount: Small (1-33%) Fat Layer (Subcutaneous Tissue) Exposed: No Necrotic Quality: Adherent Slough Tendon Exposed: No Muscle Exposed: No Joint Exposed: No Bone Exposed: No Blubaugh, Birch E. ([id number]) Periwound Skin Texture Texture Color No Abnormalities Noted: No No Abnormalities Noted: No Callus: No Atrophie Blanche: No Crepitus: No Cyanosis: No Excoriation: No Ecchymosis: No Induration: No Erythema: No Rash: No Hemosiderin Staining: No Scarring: No Mottled: No Pallor: No Moisture Rubor: No No Abnormalities Noted: No Dry / Scaly: No Temperature / Pain Maceration: No Temperature: No Abnormality Tenderness on Palpation: Yes Wound Preparation Ulcer Cleansing: Rinsed/Irrigated with Saline Topical Anesthetic  Applied: Other: lidocaine 4%, Treatment Notes Wound #13 (Right Forearm) 1. Cleansed with: Clean wound with Normal Saline 2. Anesthetic Topical Lidocaine 4% cream to wound bed prior to debridement 4. Dressing Applied: Prisma Ag 5. Secondary Dressing Applied Bordered Foam Dressing Electronic Signature(s) Signed: 07/22/2017 5:07:56 PM By: Alric Quan Entered By: Alric Quan on 07/22/2017 09:21:58 Bogdon, [name] ([id number]) -------------------------------------------------------------------------------- Wound Assessment Details Patient Name: [name] Date of Service: 07/22/2017 8:45 AM Medical Record Number: [id number] Patient Account Number: [id number] Date of Birth/Sex: [birth year] (76 y.o. M) Treating RN: Roger Shelter Primary Care Dalasia Predmore: Cyndi Bender Other Clinician:  Referring Danen Lapaglia: Cyndi Bender Treating Clarissia Mckeen/Extender: Tito Dine in Treatment: 15 Wound Status Wound Number: 14 Primary Skin Tear Etiology: Wound Location: Right Upper Arm Wound Healed - Epithelialized Wounding Event: Trauma Status: Date Acquired: 07/10/2017 Comorbid Congestive Heart Failure, Coronary Artery Weeks Of Treatment: 1 History: Disease, Hypertension, Myocardial Infarction, Clustered Wound: No Osteoarthritis Photos Photo Uploaded By: Alric Quan on 07/22/2017 17:05:10 Wound Measurements Length: (cm) 0 Width: (cm) 0 Depth: (cm) 0 Area: (cm) 0 Volume: (cm) 0 % Reduction in Area: 100% % Reduction in Volume: 100% Epithelialization: Small (1-33%) Tunneling: No Undermining: No Wound Description Full Thickness Without Exposed Support Classification: Structures Wound Margin: Flat and Intact Exudate Small Amount: Exudate Type: Sanguinous Exudate Color: red Foul Odor After Cleansing: No Slough/Fibrino No Wound Bed Granulation Amount: Large (67-100%) Exposed Structure Granulation Quality: Red Fascia Exposed: No Necrotic Amount:  None Present (0%) Fat Layer (Subcutaneous Tissue) Exposed: No Tendon Exposed: No Muscle Exposed: No Joint Exposed: No Bone Exposed: No Kehl, Draedyn E. ([id number]) Periwound Skin Texture Texture Color No Abnormalities Noted: No No Abnormalities Noted: No Callus: No Atrophie Blanche: No Crepitus: No Cyanosis: No Excoriation: No Ecchymosis: No Induration: No Erythema: No Rash: No Hemosiderin Staining: No Scarring: No Mottled: No Pallor: No Moisture Rubor: No No Abnormalities Noted: No Dry / Scaly: No Temperature / Pain Maceration: No Temperature: No Abnormality Tenderness on Palpation: Yes Wound Preparation Ulcer Cleansing: Rinsed/Irrigated with Saline Topical Anesthetic Applied: Other: lidocaine 4%, Electronic Signature(s) Signed: 07/22/2017 4:58:57 PM By: Roger Shelter Entered By: Roger Shelter on 07/22/2017 09:52:14 Welcher, [name] ([id number]) -------------------------------------------------------------------------------- Wound Assessment Details Patient Name: [name] Date of Service: 07/22/2017 8:45 AM Medical Record Number: [id number] Patient Account Number: [id number] Date of Birth/Sex: July 22, 1940 (76 y.o. M) Treating RN: Ahmed Prima Primary Care Elmore Hyslop: Cyndi Bender Other Clinician: Referring Olanrewaju Osborn: Cyndi Bender Treating Allyse Fregeau/Extender: Tito Dine in Treatment: 15 Wound Status Wound Number: 15 Primary Venous Leg Ulcer Etiology: Wound Location: Right Lower Leg - Anterior, Proximal Wound Open Wounding Event: Gradually Appeared Status: Date Acquired: 07/22/2017 Comorbid Congestive Heart Failure, Coronary Artery Weeks Of Treatment: 0 History: Disease, Hypertension, Myocardial Infarction, Clustered Wound: No Osteoarthritis Photos Photo Uploaded By: Alric Quan on 07/22/2017 17:04:49 Wound Measurements Length: (cm) 0.8 Width: (cm) 0.5 Depth: (cm) 0.1 Area: (cm) 0.314 Volume: (cm) 0.031 %  Reduction in Area: % Reduction in Volume: Epithelialization: None Tunneling: No Undermining: No Wound Description Full Thickness Without Exposed Support Classification: Structures Wound Margin: Distinct, outline attached Exudate Large Amount: Exudate Type: Serous Exudate Color: amber Foul Odor After Cleansing: No Slough/Fibrino Yes Wound Bed Granulation Amount: None Present (0%) Exposed Structure Necrotic Amount: Large (67-100%) Fascia Exposed: No Necrotic Quality: Adherent Slough Fat Layer (Subcutaneous Tissue) Exposed: No Tendon Exposed: No Muscle Exposed: No Joint Exposed: No Bone Exposed: No Mcculley, Kaliq E. ([id number]) Periwound Skin Texture Texture Color No Abnormalities Noted: No No Abnormalities Noted: No Moisture Temperature / Pain No Abnormalities Noted: No Temperature: No Abnormality Tenderness on Palpation: Yes Wound Preparation Ulcer Cleansing: Rinsed/Irrigated with Saline, Other: soap and water, Topical Anesthetic Applied: Other: lidocaine 4%, Treatment Notes Wound #15 (Right, Proximal, Anterior Lower Leg) 1. Cleansed with: Cleanse wound with antibacterial soap and water 2. Anesthetic Topical Lidocaine 4% cream to wound bed prior to debridement 4. Dressing Applied: Prisma Ag 5. Secondary Dressing Applied ABD Pad 7. Secured with Other (specify in notes) Notes kerlix/coban wrap, Electronic Signature(s) Signed: 07/22/2017 5:07:56 PM By: Alric Quan Entered By: Alric Quan on 07/22/2017 09:17:22 Rezendes, [name] ([id number]) --------------------------------------------------------------------------------  Wound Assessment Details Patient Name: DAISEAN, BRODHEAD Date of Service: 07/22/2017 8:45 AM Medical Record Number: [id number] Patient Account Number: [id number] Date of Birth/Sex: Jul 23, 1940 (76 y.o. M) Treating RN: Ahmed Prima Primary Care Madason Rauls: Cyndi Bender Other Clinician: Referring Nichole Neyer: Cyndi Bender Treating Karalina Tift/Extender: Tito Dine in Treatment: 15 Wound Status Wound Number: 6 Primary Arterial Insufficiency Ulcer Etiology: Wound Location: Right Malleolus - Lateral Wound Open Wounding Event: Gradually Appeared Status: Date Acquired: 03/23/2017 Comorbid Congestive Heart Failure, Coronary Artery Weeks Of Treatment: 15 History: Disease, Hypertension, Myocardial Infarction, Clustered Wound: No Osteoarthritis Photos Photo Uploaded By: Alric Quan on 07/22/2017 17:04:50 Wound Measurements Length: (cm) 0.4 Width: (cm) 0.4 Depth: (cm) 0.3 Area: (cm) 0.126 Volume: (cm) 0.038 % Reduction in Area: -306.5% % Reduction in Volume: -1166.7% Epithelialization: None Tunneling: No Undermining: No Wound Description Full Thickness Without Exposed Support Classification: Structures Wound Margin: Distinct, outline attached Exudate Large Amount: Exudate Type: Serous Exudate Color: amber Foul Odor After Cleansing: No Slough/Fibrino Yes Wound Bed Granulation Amount: None Present (0%) Exposed Structure Necrotic Amount: Large (67-100%) Fascia Exposed: No Necrotic Quality: Adherent Slough Fat Layer (Subcutaneous Tissue) Exposed: No Tendon Exposed: No Muscle Exposed: No Joint Exposed: No Bone Exposed: No Andress, Win E. ([id number]) Periwound Skin Texture Texture Color No Abnormalities Noted: No No Abnormalities Noted: No Callus: Yes Atrophie Blanche: No Crepitus: No Cyanosis: No Excoriation: No Ecchymosis: No Induration: No Erythema: No Rash: No Hemosiderin Staining: No Scarring: No Mottled: No Pallor: No Moisture Rubor: No No Abnormalities Noted: No Dry / Scaly: No Temperature / Pain Maceration: No Temperature: No Abnormality Tenderness on Palpation: Yes Wound Preparation Ulcer Cleansing: Other: soap and water, Topical Anesthetic Applied: Other: lidocaine 4%, Treatment Notes Wound #6 (Right, Lateral Malleolus) 1. Cleansed  with: Cleanse wound with antibacterial soap and water 2. Anesthetic Topical Lidocaine 4% cream to wound bed prior to debridement 4. Dressing Applied: Prisma Ag 5. Secondary Dressing Applied ABD Pad 7. Secured with Other (specify in notes) Notes kerlix/coban wrap, Electronic Signature(s) Signed: 07/22/2017 5:07:56 PM By: Alric Quan Entered By: Alric Quan on 07/22/2017 09:23:44 Bebout, [name] ([id number]) -------------------------------------------------------------------------------- Wound Assessment Details Patient Name: [name] Date of Service: 07/22/2017 8:45 AM Medical Record Number: [id number] Patient Account Number: [id number] Date of Birth/Sex: 1940/01/16 (76 y.o. M) Treating RN: Ahmed Prima Primary Care Orlene Salmons: Cyndi Bender Other Clinician: Referring Molina Hollenback: Cyndi Bender Treating Talisha Erby/Extender: Tito Dine in Treatment: 15 Wound Status Wound Number: 8 Primary Arterial Insufficiency Ulcer Etiology: Wound Location: Right [name] - Lateral Wound Open Wounding Event: Gradually Appeared Status: Date Acquired: 04/29/2017 Comorbid Congestive Heart Failure, Coronary Artery Weeks Of Treatment: 12 History: Disease, Hypertension, Myocardial Infarction, Clustered Wound: No Osteoarthritis Photos Photo Uploaded By: Alric Quan on 07/22/2017 17:00:02 Wound Measurements Length: (cm) 0.7 Width: (cm) 0.7 Depth: (cm) 0.4 Area: (cm) 0.385 Volume: (cm) 0.154 % Reduction in Area: -442.3% % Reduction in Volume: -2100% Epithelialization: None Tunneling: No Undermining: No Wound Description Full Thickness With Exposed Support Classification: Structures Wound Margin: Distinct, outline attached Exudate Large Amount: Exudate Type: Serous Exudate Color: amber Foul Odor After Cleansing: No Slough/Fibrino Yes Wound Bed Granulation Amount: None Present (0%) Exposed Structure Necrotic Amount: Large (67-100%) Fascia  Exposed: No Necrotic Quality: Adherent Slough Fat Layer (Subcutaneous Tissue) Exposed: Yes Tendon Exposed: No Muscle Exposed: No Joint Exposed: No Bone Exposed: Yes Gervase, Kashmir E. ([id number]) Periwound Skin Texture Texture Color No Abnormalities Noted: No No Abnormalities Noted: No Callus: Yes Temperature / Pain Moisture Temperature: No Abnormality No Abnormalities  Noted: No Tenderness on Palpation: Yes Maceration: Yes Wound Preparation Ulcer Cleansing: Other: soap and water, Topical Anesthetic Applied: Other: lidocaine 4%, Treatment Notes Wound #8 (Right, Lateral [name]) 1. Cleansed with: Cleanse wound with antibacterial soap and water 2. Anesthetic Topical Lidocaine 4% cream to wound bed prior to debridement 4. Dressing Applied: Prisma Ag 5. Secondary Dressing Applied ABD Pad 7. Secured with Other (specify in notes) Notes kerlix/coban wrap, Electronic Signature(s) Signed: 07/22/2017 5:07:56 PM By: Alric Quan Entered By: Alric Quan on 07/22/2017 09:24:36 Joslyn, [name] ([id number]) -------------------------------------------------------------------------------- Wound Assessment Details Patient Name: [name] Date of Service: 07/22/2017 8:45 AM Medical Record Number: [id number] Patient Account Number: [id number] Date of Birth/Sex: 1940/02/16 (76 y.o. M) Treating RN: Ahmed Prima Primary Care Jameisha Stofko: Cyndi Bender Other Clinician: Referring Sadrac Zeoli: Cyndi Bender Treating Zamora Colton/Extender: Tito Dine in Treatment: 15 Wound Status Wound Number: 9 Primary Pressure Ulcer Etiology: Wound Location: Left Metatarsal head first Wound Open Wounding Event: Gradually Appeared Status: Date Acquired: 05/06/2017 Comorbid Congestive Heart Failure, Coronary Artery Weeks Of Treatment: 9 History: Disease, Hypertension, Myocardial Infarction, Clustered Wound: No Osteoarthritis Photos Photo Uploaded By: Alric Quan on  07/22/2017 17:00:03 Wound Measurements Length: (cm) 0.1 Width: (cm) 0.1 Depth: (cm) 0.1 Area: (cm) 0.008 Volume: (cm) 0.001 % Reduction in Area: 95.9% % Reduction in Volume: 95% Epithelialization: None Tunneling: No Undermining: No Wound Description Classification: Category/Stage II Wound Margin: Flat and Intact Exudate Amount: Medium Exudate Type: Serous Exudate Color: amber Foul Odor After Cleansing: No Slough/Fibrino Yes Wound Bed Granulation Amount: None Present (0%) Exposed Structure Necrotic Amount: Large (67-100%) Fascia Exposed: No Necrotic Quality: Adherent Slough Fat Layer (Subcutaneous Tissue) Exposed: No Tendon Exposed: No Muscle Exposed: No Joint Exposed: No Bone Exposed: No Periwound Skin Texture Fishel, Mateus E. ([id number]) Texture Color No Abnormalities Noted: No No Abnormalities Noted: No Callus: Yes Atrophie Blanche: No Crepitus: No Cyanosis: No Excoriation: No Ecchymosis: No Induration: No Erythema: No Rash: No Hemosiderin Staining: No Scarring: No Mottled: No Pallor: No Moisture Rubor: No No Abnormalities Noted: No Dry / Scaly: No Temperature / Pain Maceration: Yes Temperature: No Abnormality Wound Preparation Ulcer Cleansing: Rinsed/Irrigated with Saline Topical Anesthetic Applied: Other: lidocaine 4%, Treatment Notes Wound #9 (Left Metatarsal head first) 1. Cleansed with: Clean wound with Normal Saline 2. Anesthetic Topical Lidocaine 4% cream to wound bed prior to debridement 4. Dressing Applied: Santyl Ointment 5. Secondary Dressing Applied Dry [place] Signature(s) Signed: 07/22/2017 5:07:56 PM By: Alric Quan Entered By: Alric Quan on 07/22/2017 09:25:33 Vonada, [name] ([id number]) -------------------------------------------------------------------------------- [place] Details Patient Name: [name] Date of Service: 07/22/2017 8:45 AM Medical Record Number:  [id number] Patient Account Number: [id number] Date of Birth/Sex: 1940-10-27 (76 y.o. M) Treating RN: Ahmed Prima Primary Care Lan Mcneill: Cyndi Bender Other Clinician: Referring Tanea Moga: Cyndi Bender Treating Ezma Rehm/Extender: Tito Dine in Treatment: 15 Vital Signs Time Taken: 09:03 Temperature (F): 97.6 Height (in): 68 Pulse (bpm): 64 Respiratory Rate (breaths/min): 16 Blood Pressure (mmHg): 101/56 Reference Range: 80 - 120 mg / dl Electronic Signature(s) Signed: 07/22/2017 5:07:56 PM By: Alric Quan Entered By: Alric Quan on 07/22/2017 09:05:28

## 2017-07-24 DIAGNOSIS — L97311 Non-pressure chronic ulcer of right ankle limited to breakdown of skin: Secondary | ICD-10-CM | POA: Diagnosis not present

## 2017-07-24 DIAGNOSIS — I255 Ischemic cardiomyopathy: Secondary | ICD-10-CM | POA: Diagnosis not present

## 2017-07-24 DIAGNOSIS — I110 Hypertensive heart disease with heart failure: Secondary | ICD-10-CM | POA: Diagnosis not present

## 2017-07-24 DIAGNOSIS — I2510 Atherosclerotic heart disease of native coronary artery without angina pectoris: Secondary | ICD-10-CM | POA: Diagnosis not present

## 2017-07-24 DIAGNOSIS — L97511 Non-pressure chronic ulcer of other part of right foot limited to breakdown of skin: Secondary | ICD-10-CM | POA: Diagnosis not present

## 2017-07-24 DIAGNOSIS — I5023 Acute on chronic systolic (congestive) heart failure: Secondary | ICD-10-CM | POA: Diagnosis not present

## 2017-07-25 NOTE — Progress Notes (Signed)
[name], [name] ([id number]) Visit Report for 07/22/2017 HPI Details Patient Name: [name], [name] Date of Service: 07/22/2017 8:45 AM Medical Record Number: [id number] Patient Account Number: [id number] Date of Birth/Sex: 1940-07-02 (77 y.o. M) Treating RN: Montey Hora Primary Care Provider: Cyndi Bender Other Clinician: Referring Provider: Cyndi Bender Treating Provider/Extender: Tito Dine in Treatment: 15 History of Present Illness HPI Description: 04/08/17; this is a complex 77 year old man referred here from [facility]. He had been referred there for bilateral lower extremity edema with ulcer formation predominantly on the right calf but also the right foot. He had been receiving Unna boots bilaterally. The history here is long. He is not a diabetic however ICU looking through late 2018 he was worked up for chronic headaches, elevated inflammatory markers including C-reactive protein and ESR. He went on to actually have a left temporal artery biopsy wasn't that was negative.he received about 6 weeks of high-dose prednisone 60 mg with improvement in his inflammatory markers. He was admitted to hospital in late November with ventricular tachycardia syncope. He has known ischemic cardiomyopathy. He was admitted in the hospital in mid December. Apparently this was precipitated by a syncopal spell falling out of his scooter while at [place]. There was ventricular arrhythmia. He has an implantable defibrillator and echocardiogram showed severe LV dysfunction with an EF of 20% and valvular regurgitations including mild AR, moderate MR. There was no stenosis. He ruled in for a non-ST elevation MI in the setting of V. tach.his wife states that sometime during this hospitalization she noted multiple areas of skin change on the right lower calf which became evident just after he left the hospital. He was back in hospital in February with acute renal failure  hyponatremia. This responded to fluid resuscitation.. Interestingly I can't see much description of his right leg at that point in time.he was followed by Dr. Nehemiah Massed of dermatology for the necrotic wounds on his right leg. Apparently a biopsy was planned at one point but not done although in some notes that suggests it was. I cannot see these results area He was noted to have a lot of edema. Was treated with bilateral Unna boots edges really helped with the swelling they have been using Bactroban to small open areas predominantly on the right anterior lower leg His history is complicated by the fact that he has rheumatoid arthritis followed by rheumatology. He is followed by neurology for disabling headaches. At one point this was felt to be giant cell arteritis although a left temporal biopsy was apparently negative. He was given a prolonged course of prednisone at 60 mg which managed his sedimentation rates but apparently did not prove improve the headaches. This is been tapered to off on by rheumatology on 03/13/17 Vascular had plans to do a venous reflux workup as well as arterial studies in May. They also wanted to get him a lymphedema pump. As mentioned he's been using bacitracin under Unna boot wraps to both lower legs 04/15/17; the patient arrives with most of his wounds improved. These are small punched out wounds. Most of them remaining ones are on the right anterior calf with the most problematic over the right lateral malleolus. There are no new areas. The symptom complex or potential symptom complex we are dealing with his chronic disabling headaches with inflammatory markers not responsive to prednisone and with a negative temporal biopsy, lower extremity weakness, skin ulcerations just on the right leg. We have managed to get his arterial studies  moved up to April 30. He has a rheumatology consult at [facility] in June. He has seen dermatology locally, rheumatology locally, neurology  locally. 04/22/17; small punched out areas on the right leg anteriorly posteriorly. Most of these appear to have closed over. Some of them have eschar over the surface. The most problematic area appears to be over the right lateral malleolus. We've been using prisma to all of this. He has arterial studies on April 30 and a rheumatology consult at [facility] on June 28 04/29/17; most of the small punched out areas on the right leg posteriorly are closed. He has 2 or 3 openings anteriorly but most of these appear to be on the way to closing. Still problematically over the right lateral malleolus and right lateral foot with almost ischemic-looking eschar. His arterial studies that I ordered are due to be done next week on the 30th so we should have them available for our next visit hopefully. He also saw a rheumatologist at [facility] and according to the patient he did 8 Garay, Aceyn E. ([id number]) vials of blood. Finally he has scaling rash on his left foot and what looks to be at [facility] area on the left anterior leg 05/06/17; most of the small punched out areas on the right leg posteriorly and anteriorly are closed. He still has one small one anteriorly one over the right lateral malleolus and one over the right lateral foot. He had his arterial studies they didn't seem to do waveform analysis not exactly sure why however in any case is ABIs were noncompressible bilaterally. They did provide TBIs although looking at the pressures it appears that his TBIs are quite normal. I therefore went ahead and debrided the area over the right lateral malleolus and the right lateral foot 05/13/17; most of the small punched out areas on the right leg posteriorly and anteriorly are closed. He continues to have problematic areas over the right lateral malleolus and the right lateral foot. He still requiring aggressive debridement of these 2 wounds using silver collagen I reviewed the note from rheumatology I don't think they  came up with a specific diagnosis although he is known to have seropositive RA. They did a panel of lab work when I was able to see his his AMA was negative, anti-smooth muscle antibodies negative antineutrophil cytoplasmic antibodies negative,Liv/kia type 1 negative. Serum C3 and C4 were negative. I don't see his muscle enzymes specifically. He was referred back to his local rheumatologist for management of his known rheumatoid arthritis.the patient states he still feels weak and fatigued. He states he has numbness in both feet and apparently is known to have neuropathy 05/20/17; the patient continues to have a difficult problem on the right lateral malleolus and the right lateral foot. Both of these wounds have no viable surface even with attempts at debridement. He has a new wound on the left plantar metatarsal head which looks more like a superficial diabetic pressure related injury then part of this underlying issue he has. Most of the rest of the wounds on his legs look satisfactory. Mostly on the right calf.reviewed his arterial studies which showed noncompressible vessels bilaterally but the TBIs were quite normal. 05/27/17; no real improvement in the right lateral malleolus and right lateral foot. In fact the right lateral foot is now on bone. I gave him doxycycline empirically last week it appears that he developed photosensitivity was 55 the son in a tractor. I'll not give him any more of this. This is predominantly  on his face and dorsal forearms and hands. I given this more as an anti- inflammatory. I'm going to have him seen by vascular surgery. His TBIs that he had done previously ordered by Dr. Brigitte Pulse were in the normal range They never did full arterial studies on him. he now has small punched out wounds on the right lateral ankle and right lateral foot. I doubt these are ischemic however I would like a review of his macrovascular status. He does describe some pain at night. I'll  reduce the compression from 3 layer to 2 layer and I'm not convinced that this is a macrovascular issue however I want to make sure. We've been using silver alginate 06/03/17; the patient's x-rays that I ordered last time of his right ankle and right lateral foot did not show definite osteomyelitis. We've been using silver alginate. The wounds are not making any progress. The area on the plantar left first metatarsal head however appears to be better. The patient complains of weakness that is more of the fatigue. He says if he's walking his head will fall onto his chest and that his legs literally gave out on him. He did see neurology in the past however that was at a time where his workup was for temporal arteritis and headaches. 06/10/17; the patient is making no progress with the areas on the right lateral foot and right lateral malleolus. In fact the area on the foot probes to bone. X-ray did not show osteomyelitis. He went and saw vein and vascular on 06/04/17 he was felt to have significant reflux in the left greater saphenous vein over this is not in the area we are most concerned about. He could be offered ablation. He was not felt to have venous reflux noted in the right lower extremity. He was felt to have some degree of lymphedema and he was felt to be a candidate for compression pumps. Finally a diagnostic arteriogram was suggested which is really what I'm most interested in. The patient has wife wanted time to think about this, I think they were confused about interacting between venous and arterial discussions. I think it would be probably well worth going through the angiogram. Culture I did have the deeper area on the right lateral foot showed a few Enterococcus faecalis. I would like to start her on amoxicillin which they will start today for one-week 500 3 times a day 06/17/17; the patient still has punched out areas on the right lateral foot and right lateral malleolus. There is not a  viable surface here. We have been using Santyl. He also has an area on the plantar aspect of his left first metatarsal head this also seems to be better 06/24/17; patient's angiogram as on 07/06/17; still has punched out areas on the right lateral foot and right lateral malleolus to which we've been applying Santyl-based dressings. He also has a more superficial area on the left plantar first metatarsal head, using collagen here 07/08/17; the patient had his angiogram. This perineal artery had a 70-80% stenosis. Similarly the posterior tibial artery also was diseased. Furthermore down the posterior tibial artery in the distal segment was a short stenosis of 80%. His major vessels in his thigh and only minor irregularity. He had percutaneous angioplasty of the proximal right peroneal artery. Also angioplasty of the tibial peroneal trunk and proximal posterior tibial artery as well as the mid to distal segment of the posterior tibial artery. He handled this remarkably well. The patient arrived with a  new wound on his right anterior calf which I think is the reopening of one of the original small open areas Rosengren, MANJINDER BREAU. ([id number]) 07/15/17; the areas on his right anterior calf is new. He has a new threatened area on the right tibia more superiorly which is not open yet but makes me wonder whether this is going to reopen as well. His original wounds on the right lateral foot and right lateral malleolus are somewhat better in terms of surface 07/22/17; the patient has a second open area on the right anterior. This was a threatened area superiorly last week. Each time this happens she has a small wound with a nephrotic cover. The areas on the right lateral foot and right lateral malleolus are about the same. I did not attempt debridement in any of these today continuing with Santyl. oThe area on the left second metatarsal head looks better and he is healed laceration injuries on the arm from a fall  last week with only the ventral forearm wound left Electronic Signature(s) Signed: 07/24/2017 7:56:50 AM By: Linton Ham MD Entered By: Linton Ham on 07/22/2017 10:00:28 Group, [name] ([id number]) -------------------------------------------------------------------------------- Physical Exam Details Patient Name: [name] Date of Service: 07/22/2017 8:45 AM Medical Record Number: [id number] Patient Account Number: [id number] Date of Birth/Sex: 09-10-1940 (77 y.o. M) Treating RN: Montey Hora Primary Care Provider: Cyndi Bender Other Clinician: Referring Provider: Cyndi Bender Treating Provider/Extender: Tito Dine in Treatment: 15 Constitutional Sitting or standing Blood Pressure is within target range for patient.. Pulse regular and within target range for patient.[name] Respirations regular, non-labored and within target range.. Temperature is normal and within the target range for the patient.[name] appears in no distress. Eyes Conjunctivae clear. No discharge. Respiratory Respiratory effort is easy and symmetric bilaterally. Rate is normal at rest and on room air.. Cardiovascular Femoral arteries without bruits and pulses strong.. Pedal pulses palpable and strong bilaterally.. Integumentary (Hair, Skin) outside of his right leg there is no obvious primary skin issue.[name] Psychiatric No evidence of depression, anxiety, or agitation. Calm, cooperative, and communicative. Appropriate interactions and affect.. Notes wound exam; oOn the right distal tibia there was a new open area last week and above it a small nodule that is reopened into a wound this week. This has the same morphology as we are used to seeing a small nodule that ulcerates into a wound with a necrotic surface. The noon from last week is about the same. oThe right lateral foot and right lateral malleolus are about the same. oThe left first metatarsal head is smaller oThe area on the left  forearm is healthy looking. Looks smaller. The area above his lateral elbow and on the dorsal thumb have healed Electronic Signature(s) Signed: 07/24/2017 7:56:50 AM By: Linton Ham MD Entered By: Linton Ham on 07/22/2017 10:05:04 Presti, [name] ([id number]) -------------------------------------------------------------------------------- Physician Orders Details Patient Name: [name] Date of Service: 07/22/2017 8:45 AM Medical Record Number: [id number] Patient Account Number: [id number] Date of Birth/Sex: 01/01/1941 (77 y.o. M) Treating RN: Roger Shelter Primary Care Provider: Cyndi Bender Other Clinician: Referring Provider: Cyndi Bender Treating Provider/Extender: Tito Dine in Treatment: 15 Verbal / Phone Orders: No Diagnosis Coding Wound Cleansing Wound #10 Right,Distal,Anterior Lower Leg o Clean wound with Normal Saline. Wound #11 Right,Medial,Posterior Lower Leg o Clean wound with Normal Saline. Wound #12 Right Hand - 1st Digit o Clean wound with Normal Saline. Wound #13 Right Forearm o Clean wound with Normal Saline. Wound #6 Right,Lateral Malleolus o  Clean wound with Normal Saline. Wound #8 Right,Lateral Foot o Clean wound with Normal Saline. Wound #9 Left Metatarsal head first o Clean wound with Normal Saline. Anesthetic (add to Medication List) Wound #10 Right,Distal,Anterior Lower Leg o Topical Lidocaine 4% cream applied to wound bed prior to debridement (In Clinic Only). Wound #11 Right,Medial,Posterior Lower Leg o Topical Lidocaine 4% cream applied to wound bed prior to debridement (In Clinic Only). Wound #12 Right Hand - 1st Digit o Topical Lidocaine 4% cream applied to wound bed prior to debridement (In Clinic Only). Wound #13 Right Forearm o Topical Lidocaine 4% cream applied to wound bed prior to debridement (In Clinic Only). Wound #6 Right,Lateral Malleolus o Topical Lidocaine 4% cream  applied to wound bed prior to debridement (In Clinic Only). Wound #8 Right,Lateral Foot o Topical Lidocaine 4% cream applied to wound bed prior to debridement (In Clinic Only). Wound #9 Left Metatarsal head first o Topical Lidocaine 4% cream applied to wound bed prior to debridement (In Clinic Only). Primary Wound Dressing HENRRY, FEIL ([id number]) Wound #10 Right,Distal,Anterior Lower Leg o Silver Collagen Wound #11 Right,Medial,Posterior Lower Leg o Silver Collagen Wound #13 Right Forearm o Silver Collagen Wound #15 Right,Proximal,Anterior Lower Leg o Silver Collagen Wound #6 Right,Lateral Malleolus o Silver Collagen Wound #8 Right,Lateral Foot o Silver Collagen Wound #9 Left Metatarsal head first o Santyl Ointment Secondary Dressing Wound #10 Right,Distal,Anterior Lower Leg o ABD pad o Kerlix and Coban Wound #11 Right,Medial,Posterior Lower Leg o ABD pad o Kerlix and Coban Wound #13 Right Forearm o Boardered Foam Dressing Wound #6 Right,Lateral Malleolus o ABD pad o ABD pad o Kerlix and Coban Wound #8 Right,Lateral Foot o ABD pad Wound #9 Left Metatarsal head first o Boardered Foam Dressing Dressing Change Frequency Wound #10 Right,Distal,Anterior Lower Leg o Change Dressing Monday, Wednesday, Friday Wound #11 Right,Medial,Posterior Lower Leg o Change Dressing Monday, Wednesday, Friday Wound #12 Right Hand - 1st Digit o Change Dressing Monday, Wednesday, Friday Wound #13 Right Forearm o Change Dressing Monday, Wednesday, Friday TYSEAN, VANDERVLIET ([id number]) Wound #6 Right,Lateral Malleolus o Change Dressing Monday, Wednesday, Friday Wound #8 Right,Lateral Foot o Change Dressing Monday, Wednesday, Friday Wound #9 Left Metatarsal head first o Change Dressing Monday, Wednesday, Friday Follow-up Appointments Wound #10 Right,Distal,Anterior Lower Leg o Return Appointment in 2 weeks. Wound #11  Right,Medial,Posterior Lower Leg o Return Appointment in 2 weeks. Wound #12 Right Hand - 1st Digit o Return Appointment in 2 weeks. Wound #13 Right Forearm o Return Appointment in 2 weeks. Wound #6 Right,Lateral Malleolus o Return Appointment in 2 weeks. Wound #8 Right,Lateral Foot o Return Appointment in 2 weeks. Wound #9 Left Metatarsal head first o Return Appointment in 2 weeks. Edema Control Wound #10 Right,Distal,Anterior Lower Leg o Kerlix and Coban - Right Lower Extremity o Elevate legs to the level of the heart and pump ankles as often as possible Wound #6 Right,Lateral Malleolus o Kerlix and Coban - Right Lower Extremity o Elevate legs to the level of the heart and pump ankles as often as possible Wound #8 Right,Lateral Foot o Kerlix and Coban - Right Lower Extremity o Elevate legs to the level of the heart and pump ankles as often as possible Home Health Wound #10 Right,Distal,Anterior Lower Leg o [place] Nurse may visit PRN to address patientos wound care needs. o FACE TO FACE ENCOUNTER: MEDICARE and MEDICAID PATIENTS: I certify that this patient is under my care and that I had a face-to-face encounter  that meets the physician face-to-face encounter requirements with this patient on this date. The encounter with the patient was in whole or in part for the following MEDICAL CONDITION: (primary reason for [place]) MEDICAL NECESSITY: I certify, that based on my findings, NURSING services are a medically necessary home health service. HOME BOUND STATUS: I certify that my clinical findings support that this patient is homebound (i.e., Due to illness or injury, pt requires aid of supportive devices such as crutches, cane, wheelchairs, walkers, the use of special transportation or the Rodrigues, RAYLIN WINER. ([id number]) assistance of another person to leave their place of residence. There is a normal inability to  leave the home and doing so requires considerable and taxing effort. Other absences are for medical reasons / religious services and are infrequent or of short duration when for other reasons). o If current dressing causes regression in wound condition, may D/C ordered dressing product/s and apply Normal Saline Moist Dressing daily until next [place] / Other MD appointment. [place] of regression in wound condition at [phone]. o Please direct any NON-WOUND related issues/requests for orders to patient's Primary Care Physician Wound #6 [place] Nurse may visit PRN to address patientos wound care needs. o FACE TO FACE ENCOUNTER: MEDICARE and MEDICAID PATIENTS: I certify that this patient is under my care and that I had a face-to-face encounter that meets the physician face-to-face encounter requirements with this patient on this date. The encounter with the patient was in whole or in part for the following MEDICAL CONDITION: (primary reason for [place]) MEDICAL NECESSITY: I certify, that based on my findings, NURSING services are a medically necessary home health service. HOME BOUND STATUS: I certify that my clinical findings support that this patient is homebound (i.e., Due to illness or injury, pt requires aid of supportive devices such as crutches, cane, wheelchairs, walkers, the use of special transportation or the assistance of another person to leave their place of residence. There is a normal inability to leave the home and doing so requires considerable and taxing effort. Other absences are for medical reasons / religious services and are infrequent or of short duration when for other reasons). o If current dressing causes regression in wound condition, may D/C ordered dressing product/s and apply Normal Saline Moist Dressing daily until next [place] / Other MD  appointment. [place] of regression in wound condition at [phone]. o Please direct any NON-WOUND related issues/requests for orders to patient's Primary Care Physician Wound #8 [place] Nurse may visit PRN to address patientos wound care needs. o FACE TO FACE ENCOUNTER: MEDICARE and MEDICAID PATIENTS: I certify that this patient is under my care and that I had a face-to-face encounter that meets the physician face-to-face encounter requirements with this patient on this date. The encounter with the patient was in whole or in part for the following MEDICAL CONDITION: (primary reason for [place]) MEDICAL NECESSITY: I certify, that based on my findings, NURSING services are a medically necessary home health service. HOME BOUND STATUS: I certify that my clinical findings support that this patient is homebound (i.e., Due to illness or injury, pt requires aid of supportive devices such as crutches, cane, wheelchairs, walkers, the use of special transportation or the assistance of another person to leave their place of residence. There is a normal inability to leave the home and  doing so requires considerable and taxing effort. Other absences are for medical reasons / religious services and are infrequent or of short duration when for other reasons). o If current dressing causes regression in wound condition, may D/C ordered dressing product/s and apply Normal Saline Moist Dressing daily until next [place] / Other MD appointment. [place] of regression in wound condition at [phone]. o Please direct any NON-WOUND related issues/requests for orders to patient's Primary Care Physician Wound #9 Left Metatarsal head first o [place] Nurse may visit PRN to address patientos wound care needs. o FACE TO FACE ENCOUNTER: MEDICARE and  MEDICAID PATIENTS: I certify that this patient is under my care and that I had a face-to-face encounter that meets the physician face-to-face encounter requirements with this patient on this date. The encounter with the patient was in whole or in part for the following MEDICAL CONDITION: (primary reason for [place]) MEDICAL NECESSITY: I certify, that based on my findings, NURSING services are a medically necessary home health service. HOME BOUND STATUS: I certify that my clinical findings support that this patient is homebound (i.e., Due to illness or injury, pt requires aid of supportive devices such as crutches, cane, wheelchairs, walkers, the use of special transportation or the assistance of another person to leave their place of residence. There is a normal inability to leave the home and doing so requires considerable and taxing effort. Other absences are for medical reasons / religious services and are infrequent or of short duration when for other reasons). CIAN, COSTANZO ([id number]) o If current dressing causes regression in wound condition, may D/C ordered dressing product/s and apply Normal Saline Moist Dressing daily until next Butler / Other MD appointment. Martinez of regression in wound condition at [phone]. o Please direct any NON-WOUND related issues/requests for orders to patient's Primary Care Physician Electronic Signature(s) Signed: 07/22/2017 4:58:57 PM By: Roger Shelter Signed: 07/24/2017 7:56:50 AM By: Linton Ham MD Entered By: Roger Shelter on 07/22/2017 09:57:52 Standre, [name] ([id number]) -------------------------------------------------------------------------------- Problem List Details Patient Name: [name] Date of Service: 07/22/2017 8:45 AM Medical Record Number: [id number] Patient Account Number: [id number] Date of Birth/Sex: [birth year] (77 y.o. 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 Lucia Bitter [place] Crane to see if he has any thoughts on this. 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 ([id number]) proximal right peroneal artery and the tibial peroneal trunk and proximal posterior tibial artery oThis looks like some form of vasculopathy to me. S41.111D Laceration without foreign body of right upper arm, 07/15/2017 Yes Yes subsequent encounter Status Complications Interventions Medical Improving 2 of the 3 areas here if he'll then includes the  upper arm using silver collagen Decision above the elbow and the right thumb. He still has an area Making : that's open on the ventral forearm. This looks better Inactive Problems Resolved Problems Electronic Signature(s) Signed: 07/24/2017 7:56:50 AM By: Linton Ham MD Entered By: Linton Ham on 07/22/2017 09:58:50 Calmes, [name] ([id number]) -------------------------------------------------------------------------------- Progress Note Details Patient Name: [name] Date of Service: 07/22/2017 8:45 AM Medical Record Number: [id number] Patient Account Number: [id number] Date of Birth/Sex: [birth year] (77 y.o. M) Treating RN: Montey Hora Primary Care Provider: Cyndi Bender Other Clinician: Referring Provider: Cyndi Bender Treating Provider/Extender: Tito Dine in Treatment: 15 Subjective History of Present Illness (HPI) 04/08/17; this is a complex 77 year old man referred here from [facility]. He had been referred there for bilateral lower extremity edema with ulcer formation predominantly on the right calf but also the right foot. He had been receiving Unna boots bilaterally. The history here is long. He is not a diabetic however ICU looking through late 2018 he was worked up for chronic headaches, elevated inflammatory markers including C-reactive protein and ESR. He went on to actually have a left temporal artery biopsy wasn't that was negative.he received about 6 weeks of high-dose prednisone 60 mg with improvement in his inflammatory markers. He was admitted to hospital in late November with ventricular tachycardia syncope. He has known ischemic cardiomyopathy. He was admitted in the hospital in mid December. Apparently this was precipitated by a syncopal spell falling out of his scooter while at [place]. There was ventricular arrhythmia. He has an implantable defibrillator and echocardiogram showed severe LV dysfunction with an  EF of 20% and valvular regurgitations including mild AR, moderate MR. There was no stenosis. He ruled in for a non-ST elevation MI in the setting of V. tach.his wife states that sometime during this hospitalization she noted multiple areas of skin change on the right lower calf which became evident just after he left the hospital. He was back in hospital in February with acute renal failure hyponatremia. This responded to fluid resuscitation.. Interestingly I can't see much description of his right leg at that point in time.he was followed by Dr. Nehemiah Massed of dermatology for the necrotic wounds on his right leg. Apparently a biopsy was planned at one point but not done although in some notes that suggests it was. I cannot see these results area He was noted to have a lot of edema. Was treated with bilateral Unna boots edges really helped with the swelling they have been using Bactroban to small open areas predominantly on the right anterior lower leg His history is complicated by the fact that he has rheumatoid arthritis followed by rheumatology. He is followed by neurology for disabling headaches. At one point this was felt to be giant cell arteritis although a left temporal biopsy was apparently negative. He was given a prolonged course of prednisone at 60 mg which managed his sedimentation rates but apparently did not prove improve the headaches. This is been tapered to off on by rheumatology  on 03/13/17 Vascular had plans to do a venous reflux workup as well as arterial studies in May. They also wanted to get him a lymphedema pump. As mentioned he's been using bacitracin under Unna boot wraps to both lower legs 04/15/17; the patient arrives with most of his wounds improved. These are small punched out wounds. Most of them remaining ones are on the right anterior calf with the most problematic over the right lateral malleolus. There are no new areas. The symptom complex or potential symptom complex  we are dealing with his chronic disabling headaches with inflammatory markers not responsive to prednisone and with a negative temporal biopsy, lower extremity weakness, skin ulcerations just on the right leg. We have managed to get his arterial studies moved up to April 30. He has a rheumatology consult at [facility] in June. He has seen dermatology locally, rheumatology locally, neurology locally. 04/22/17; small punched out areas on the right leg anteriorly posteriorly. Most of these appear to have closed over. Some of them have eschar over the surface. The most problematic area appears to be over the right lateral malleolus. We've been using prisma to all of this. He has arterial studies on April 30 and a rheumatology consult at [facility] on June 28 04/29/17; most of the small punched out areas on the right leg posteriorly are closed. He has 2 or 3 openings anteriorly but most of these appear to be on the way to closing. Still problematically over the right lateral malleolus and right lateral foot with almost ischemic-looking eschar. His arterial studies that I ordered are due to be done next week on the 30th so we should have them available for our next visit hopefully. He also saw a rheumatologist at [facility] and according to the patient he did 8 vials of blood. Finally he has scaling rash on his left foot and what looks to be at [facility] area on the left anterior leg 05/06/17; most of the small punched out areas on the right leg posteriorly and anteriorly are closed. He still has one small one Shere, Brentt E. ([id number]) anteriorly one over the right lateral malleolus and one over the right lateral foot. He had his arterial studies they didn't seem to do waveform analysis not exactly sure why however in any case is ABIs were noncompressible bilaterally. They did provide TBIs although looking at the pressures it appears that his TBIs are quite normal. I therefore went ahead and debrided the area over the  right lateral malleolus and the right lateral foot 05/13/17; most of the small punched out areas on the right leg posteriorly and anteriorly are closed. He continues to have problematic areas over the right lateral malleolus and the right lateral foot. He still requiring aggressive debridement of these 2 wounds using silver collagen I reviewed the note from rheumatology I don't think they came up with a specific diagnosis although he is known to have seropositive RA. They did a panel of lab work when I was able to see his his AMA was negative, anti-smooth muscle antibodies negative antineutrophil cytoplasmic antibodies negative,Liv/kia type 1 negative. Serum C3 and C4 were negative. I don't see his muscle enzymes specifically. He was referred back to his local rheumatologist for management of his known rheumatoid arthritis.the patient states he still feels weak and fatigued. He states he has numbness in both feet and apparently is known to have neuropathy 05/20/17; the patient continues to have a difficult problem on the right lateral malleolus and the right lateral  foot. Both of these wounds have no viable surface even with attempts at debridement. He has a new wound on the left plantar metatarsal head which looks more like a superficial diabetic pressure related injury then part of this underlying issue he has. Most of the rest of the wounds on his legs look satisfactory. Mostly on the right calf.reviewed his arterial studies which showed noncompressible vessels bilaterally but the TBIs were quite normal. 05/27/17; no real improvement in the right lateral malleolus and right lateral foot. In fact the right lateral foot is now on bone. I gave him doxycycline empirically last week it appears that he developed photosensitivity was 6 the son in a tractor. I'll not give him any more of this. This is predominantly on his face and dorsal forearms and hands. I given this more as an anti- inflammatory. I'm  going to have him seen by vascular surgery. His TBIs that he had done previously ordered by Dr. Brigitte Pulse were in the normal range They never did full arterial studies on him. he now has small punched out wounds on the right lateral ankle and right lateral foot. I doubt these are ischemic however I would like a review of his macrovascular status. He does describe some pain at night. I'll reduce the compression from 3 layer to 2 layer and I'm not convinced that this is a macrovascular issue however I want to make sure. We've been using silver alginate 06/03/17; the patient's x-rays that I ordered last time of his right ankle and right lateral foot did not show definite osteomyelitis. We've been using silver alginate. The wounds are not making any progress. The area on the plantar left first metatarsal head however appears to be better. The patient complains of weakness that is more of the fatigue. He says if he's walking his head will fall onto his chest and that his legs literally gave out on him. He did see neurology in the past however that was at a time where his workup was for temporal arteritis and headaches. 06/10/17; the patient is making no progress with the areas on the right lateral foot and right lateral malleolus. In fact the area on the foot probes to bone. X-ray did not show osteomyelitis. He went and saw vein and vascular on 06/04/17 he was felt to have significant reflux in the left greater saphenous vein over this is not in the area we are most concerned about. He could be offered ablation. He was not felt to have venous reflux noted in the right lower extremity. He was felt to have some degree of lymphedema and he was felt to be a candidate for compression pumps. Finally a diagnostic arteriogram was suggested which is really what I'm most interested in. The patient has wife wanted time to think about this, I think they were confused about interacting between venous and arterial discussions.  I think it would be probably well worth going through the angiogram. Culture I did have the deeper area on the right lateral foot showed a few Enterococcus faecalis. I would like to start her on amoxicillin which they will start today for one-week 500 3 times a day 06/17/17; the patient still has punched out areas on the right lateral foot and right lateral malleolus. There is not a viable surface here. We have been using Santyl. He also has an area on the plantar aspect of his left first metatarsal head this also seems to be better 06/24/17; patient's angiogram as on 07/06/17; still has  punched out areas on the right lateral foot and right lateral malleolus to which we've been applying Santyl-based dressings. He also has a more superficial area on the left plantar first metatarsal head, using collagen here 07/08/17; the patient had his angiogram. This perineal artery had a 70-80% stenosis. Similarly the posterior tibial artery also was diseased. Furthermore down the posterior tibial artery in the distal segment was a short stenosis of 80%. His major vessels in his thigh and only minor irregularity. He had percutaneous angioplasty of the proximal right peroneal artery. Also angioplasty of the tibial peroneal trunk and proximal posterior tibial artery as well as the mid to distal segment of the posterior tibial artery. He handled this remarkably well. The patient arrived with a new wound on his right anterior calf which I think is the reopening of one of the original small open areas 07/15/17; the areas on his right anterior calf is new. He has a new threatened area on the right tibia more superiorly which is not open yet but makes me wonder whether this is going to reopen as well. His original wounds on the right lateral foot and Leidy, Abdulah E. ([id number]) right lateral malleolus are somewhat better in terms of surface 07/22/17; the patient has a second open area on the right anterior. This was a  threatened area superiorly last week. Each time this happens she has a small wound with a nephrotic cover. The areas on the right lateral foot and right lateral malleolus are about the same. I did not attempt debridement in any of these today continuing with Santyl. The area on the left second metatarsal head looks better and he is healed laceration injuries on the arm from a fall last week with only the ventral forearm wound left Objective Constitutional Sitting or standing Blood Pressure is within target range for patient.. Pulse regular and within target range for patient.[name] Respirations regular, non-labored and within target range.. Temperature is normal and within the target range for the patient.[name] appears in no distress. Vitals Time Taken: 9:03 AM, Height: 68 in, Temperature: 97.6 F, Pulse: 64 bpm, Respiratory Rate: 16 breaths/min, Blood Pressure: 101/56 mmHg. Eyes Conjunctivae clear. No discharge. Respiratory Respiratory effort is easy and symmetric bilaterally. Rate is normal at rest and on room air.. Cardiovascular Femoral arteries without bruits and pulses strong.. Pedal pulses palpable and strong bilaterally.[name] Psychiatric No evidence of depression, anxiety, or agitation. Calm, cooperative, and communicative. Appropriate interactions and affect.. General Notes: wound exam; On the right distal tibia there was a new open area last week and above it a small nodule that is reopened into a wound this week. This has the same morphology as we are used to seeing a small nodule that ulcerates into a wound with a necrotic surface. The noon from last week is about the same. The right lateral foot and right lateral malleolus are about the same. The left first metatarsal head is smaller The area on the left forearm is healthy looking. Looks smaller. The area above his lateral elbow and on the dorsal thumb have healed Integumentary (Hair, Skin) outside of his right leg there is no obvious  primary skin issue.. Wound #10 status is Open. Original cause of wound was Gradually Appeared. The wound is located on the Right,Distal,Anterior Lower Leg. The wound measures 0.7cm length x 0.4cm width x 0.1cm depth; 0.22cm^2 area and 0.022cm^3 volume. There is Fat Layer (Subcutaneous Tissue) Exposed exposed. There is no tunneling or undermining noted. There is a medium amount  of serous drainage noted. The wound margin is distinct with the outline attached to the wound base. There is no granulation within the wound bed. There is a large (67-100%) amount of necrotic tissue within the wound bed including Adherent Slough. The periwound skin appearance did not exhibit: Callus, Crepitus, Excoriation, Induration, Rash, Scarring, Dry/Scaly, Maceration, Atrophie Blanche, Cyanosis, Ecchymosis, Hemosiderin Staining, Mottled, Pallor, Rubor, Erythema. Periwound temperature was noted as No Abnormality. The periwound has tenderness on palpation. Wound #11 status is Open. Original cause of wound was Not Known. The wound is located on the Right,Medial,Posterior Lower Leg. The wound measures 1.1cm length x 0.7cm width x 0.1cm depth; 0.605cm^2 area and 0.06cm^3 volume. There Riedlinger, Xzander E. ([id number]) is no tunneling or undermining noted. There is a medium amount of serous drainage noted. The wound margin is flat and intact. There is no granulation within the wound bed. There is a large (67-100%) amount of necrotic tissue within the wound bed including Adherent Slough. The periwound skin appearance did not exhibit: Callus, Crepitus, Excoriation, Induration, Rash, Scarring, Dry/Scaly, Maceration, Atrophie Blanche, Cyanosis, Ecchymosis, Hemosiderin Staining, Mottled, Pallor, Rubor, Erythema. Periwound temperature was noted as No Abnormality. The periwound has tenderness on palpation. Wound #12 status is Healed - Epithelialized. Original cause of wound was Trauma. The wound is located on the Right Hand - 1st Digit.  The wound measures 0cm length x 0cm width x 0cm depth; 0cm^2 area and 0cm^3 volume. There is no tunneling or undermining noted. There is a small amount of serosanguineous drainage noted. The wound margin is flat and intact. There is no granulation within the wound bed. There is a large (67-100%) amount of necrotic tissue within the wound bed including Eschar and Adherent Slough. The periwound skin appearance did not exhibit: Callus, Crepitus, Excoriation, Induration, Rash, Scarring, Dry/Scaly, Maceration, Atrophie Blanche, Cyanosis, Ecchymosis, Hemosiderin Staining, Mottled, Pallor, Rubor, Erythema. Periwound temperature was noted as No Abnormality. The periwound has tenderness on palpation. Wound #13 status is Open. Original cause of wound was Trauma. The wound is located on the Right Forearm. The wound measures 0.8cm length x 1.7cm width x 0.1cm depth; 1.068cm^2 area and 0.107cm^3 volume. There is no tunneling or undermining noted. There is a large amount of sanguinous drainage noted. The wound margin is flat and intact. There is large (67-100%) red granulation within the wound bed. There is a small (1-33%) amount of necrotic tissue within the wound bed including Adherent Slough. The periwound skin appearance did not exhibit: Callus, Crepitus, Excoriation, Induration, Rash, Scarring, Dry/Scaly, Maceration, Atrophie Blanche, Cyanosis, Ecchymosis, Hemosiderin Staining, Mottled, Pallor, Rubor, Erythema. Periwound temperature was noted as No Abnormality. The periwound has tenderness on palpation. Wound #14 status is Healed - Epithelialized. Original cause of wound was Trauma. The wound is located on the Right Upper Arm. The wound measures 0cm length x 0cm width x 0cm depth; 0cm^2 area and 0cm^3 volume. There is no tunneling or undermining noted. There is a small amount of sanguinous drainage noted. The wound margin is flat and intact. There is large (67-100%) red granulation within the wound bed. There  is no necrotic tissue within the wound bed. The periwound skin appearance did not exhibit: Callus, Crepitus, Excoriation, Induration, Rash, Scarring, Dry/Scaly, Maceration, Atrophie Blanche, Cyanosis, Ecchymosis, Hemosiderin Staining, Mottled, Pallor, Rubor, Erythema. Periwound temperature was noted as No Abnormality. The periwound has tenderness on palpation. Wound #15 status is Open. Original cause of wound was Gradually Appeared. The wound is located on the Right,Proximal,Anterior Lower Leg. The wound measures 0.8cm length  x 0.5cm width x 0.1cm depth; 0.314cm^2 area and 0.031cm^3 volume. There is no tunneling or undermining noted. There is a large amount of serous drainage noted. The wound margin is distinct with the outline attached to the wound base. There is no granulation within the wound bed. There is a large (67-100%) amount of necrotic tissue within the wound bed including Adherent Slough. Periwound temperature was noted as No Abnormality. The periwound has tenderness on palpation. Wound #6 status is Open. Original cause of wound was Gradually Appeared. The wound is located on the Right,Lateral Malleolus. The wound measures 0.4cm length x 0.4cm width x 0.3cm depth; 0.126cm^2 area and 0.038cm^3 volume. There is no tunneling or undermining noted. There is a large amount of serous drainage noted. The wound margin is distinct with the outline attached to the wound base. There is no granulation within the wound bed. There is a large (67-100%) amount of necrotic tissue within the wound bed including Adherent Slough. The periwound skin appearance exhibited: Callus. The periwound skin appearance did not exhibit: Crepitus, Excoriation, Induration, Rash, Scarring, Dry/Scaly, Maceration, Atrophie Blanche, Cyanosis, Ecchymosis, Hemosiderin Staining, Mottled, Pallor, Rubor, Erythema. Periwound temperature was noted as No Abnormality. The periwound has tenderness on palpation. Wound #8 status is Open.  Original cause of wound was Gradually Appeared. The wound is located on the Right,Lateral Foot. The wound measures 0.7cm length x 0.7cm width x 0.4cm depth; 0.385cm^2 area and 0.154cm^3 volume. There is bone and Fat Layer (Subcutaneous Tissue) Exposed exposed. There is no tunneling or undermining noted. There is a large amount of serous drainage noted. The wound margin is distinct with the outline attached to the wound base. There is no granulation within the wound bed. There is a large (67-100%) amount of necrotic tissue within the wound bed including Adherent Slough. The periwound skin appearance exhibited: Callus, Maceration. Periwound temperature was noted as No Abnormality. The periwound has tenderness on palpation. Wound #9 status is Open. Original cause of wound was Gradually Appeared. The wound is located on the Left Metatarsal head first. The wound measures 0.1cm length x 0.1cm width x 0.1cm depth; 0.008cm^2 area and 0.001cm^3 volume. There is no tunneling or undermining noted. There is a medium amount of serous drainage noted. The wound margin is flat and intact. There is no granulation within the wound bed. There is a large (67-100%) amount of necrotic tissue within the wound bed including Adherent Slough. The periwound skin appearance exhibited: Callus, Maceration. The periwound skin appearance did not exhibit: Crepitus, Excoriation, Induration, Rash, Scarring, Dry/Scaly, Atrophie Blanche, Cyanosis, Noble, Jaspreet E. ([id number]) Ecchymosis, Hemosiderin Staining, Mottled, Pallor, Rubor, Erythema. Periwound temperature was noted as No Abnormality. 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 Wound #11 Right,Medial,Posterior Lower Leg: Clean wound with Normal Saline. Wound #12 Right Hand - 1st Digit: Clean wound with Normal Saline. Wound #13 Right Forearm: Clean wound with Normal Saline. Wound #6 Right,Lateral Malleolus: Clean wound with Normal Saline. Wound #8 Right,Lateral Foot: Clean wound with Normal Saline. Wound #9 Left Metatarsal head first: Clean wound with Normal Saline. Anesthetic (add  to Medication List): Wound #10 Right,Distal,Anterior Lower Leg: Topical Lidocaine 4% cream applied to wound bed prior to debridement (In Clinic Only). Wound #11 Right,Medial,Posterior Lower Leg: Topical Lidocaine 4% cream applied to wound bed prior to debridement (In Clinic Only). Wound #12 Right Hand - 1st Digit: Topical Lidocaine 4% cream applied to wound bed prior to debridement (In Clinic Only). Wound #13 Right Forearm: Topical Lidocaine 4% cream applied to wound bed prior to debridement (In Clinic Only). Wound #6 Right,Lateral Malleolus: Topical Lidocaine 4% cream applied to wound bed prior to debridement (In Clinic Only). Wound #8 Right,Lateral Foot: Topical Lidocaine 4% cream applied to wound bed prior to debridement (In Clinic Only). Wound #9 Left Metatarsal head first: Topical Lidocaine 4% cream applied to wound bed prior to debridement (In Clinic Only). Primary Wound Dressing: Wound #10 Right,Distal,Anterior Lower Leg: Silver Collagen Wound #11 Right,Medial,Posterior Lower Leg: WHYATT, KLINGER ([id number]) Silver Collagen Wound #13 Right Forearm: Silver Collagen Wound #15 Right,Proximal,Anterior Lower Leg: Silver Collagen Wound #6 Right,Lateral Malleolus: Silver Collagen Wound #8 Right,Lateral Foot: Silver Collagen Wound #9 Left Metatarsal head  first: Santyl Ointment Secondary Dressing: Wound #10 Right,Distal,Anterior Lower Leg: ABD pad Kerlix and Coban Wound #11 Right,Medial,Posterior Lower Leg: ABD pad Kerlix and Coban Wound #13 Right Forearm: Boardered Foam Dressing Wound #6 Right,Lateral Malleolus: ABD pad ABD pad Kerlix and Coban Wound #8 Right,Lateral Foot: ABD pad Wound #9 Left Metatarsal head first: Boardered Foam Dressing Dressing Change Frequency: Wound #10 Right,Distal,Anterior Lower Leg: Change Dressing Monday, Wednesday, Friday Wound #11 Right,Medial,Posterior Lower Leg: Change Dressing Monday, Wednesday, Friday Wound #12 Right Hand - 1st Digit: Change Dressing Monday, Wednesday, Friday Wound #13 Right Forearm: Change Dressing Monday, Wednesday, Friday Wound #6 Right,Lateral Malleolus: Change Dressing Monday, Wednesday, Friday Wound #8 Right,Lateral Foot: Change Dressing Monday, Wednesday, Friday Wound #9 Left Metatarsal head first: Change Dressing Monday, Wednesday, Friday Follow-up Appointments: Wound #10 Right,Distal,Anterior Lower Leg: Return Appointment in 2 weeks. Wound #11 Right,Medial,Posterior Lower Leg: Return Appointment in 2 weeks. Wound #12 Right Hand - 1st Digit: Return Appointment in 2 weeks. Wound #13 Right Forearm: Return Appointment in 2 weeks. Wound #6 Right,Lateral Malleolus: Return Appointment in 2 weeks. Wound #8 Right,Lateral Foot: Return Appointment in 2 weeks. Wound #9 Left Metatarsal head first: Return Appointment in 2 weeks. Edema Control: Wound #10 Right,Distal,Anterior Lower Leg: Robar, YON SCHIFFMAN. ([id number]) Kerlix and Coban - Right Lower Extremity Elevate legs to the level of the heart and pump ankles as often as possible Wound #6 Right,Lateral Malleolus: Kerlix and Coban - Right Lower Extremity Elevate legs to the level of the heart and pump ankles as often as possible Wound #8 Right,Lateral Foot: Kerlix and Coban - Right Lower Extremity Elevate legs to  the level of the heart and pump ankles as often as possible Home Health: Wound #10 Right,Distal,Anterior Lower Leg: [place] Nurse may visit PRN to address patient s wound care needs. FACE TO FACE ENCOUNTER: MEDICARE and MEDICAID PATIENTS: I certify that this patient is under my care and that I had a face-to-face encounter that meets the physician face-to-face encounter requirements with this patient on this date. The encounter with the patient was in whole or in part for the following MEDICAL CONDITION: (primary reason for [place]) MEDICAL NECESSITY: I certify, that based on my findings, NURSING services are a medically necessary home health service. HOME BOUND STATUS: I certify that my clinical findings support that this patient is homebound (i.e., Due to illness or injury, pt requires  aid of supportive devices such as crutches, cane, wheelchairs, walkers, the use of special transportation or the assistance of another person to leave their place of residence. There is a normal inability to leave the home and doing so requires considerable and taxing effort. Other absences are for medical reasons / religious services and are infrequent or of short duration when for other reasons). If current dressing causes regression in wound condition, may D/C ordered dressing product/s and apply Normal Saline Moist Dressing daily until next [place] / Other MD appointment. [place] of regression in wound condition at [phone]. Please direct any NON-WOUND related issues/requests for orders to patient's Primary Care Physician Wound #6 Right,Lateral Malleolus: [place] Nurse may visit PRN to address patient s wound care needs. FACE TO FACE ENCOUNTER: MEDICARE and MEDICAID PATIENTS: I certify that this patient is under my care and that I had a face-to-face encounter that meets the physician face-to-face  encounter requirements with this patient on this date. The encounter with the patient was in whole or in part for the following MEDICAL CONDITION: (primary reason for [place]) MEDICAL NECESSITY: I certify, that based on my findings, NURSING services are a medically necessary home health service. HOME BOUND STATUS: I certify that my clinical findings support that this patient is homebound (i.e., Due to illness or injury, pt requires aid of supportive devices such as crutches, cane, wheelchairs, walkers, the use of special transportation or the assistance of another person to leave their place of residence. There is a normal inability to leave the home and doing so requires considerable and taxing effort. Other absences are for medical reasons / religious services and are infrequent or of short duration when for other reasons). If current dressing causes regression in wound condition, may D/C ordered dressing product/s and apply Normal Saline Moist Dressing daily until next [place] / Other MD appointment. [place] of regression in wound condition at [phone]. Please direct any NON-WOUND related issues/requests for orders to patient's Primary Care Physician Wound #8 Right,Lateral Foot: [place] Nurse may visit PRN to address patient s wound care needs. FACE TO FACE ENCOUNTER: MEDICARE and MEDICAID PATIENTS: I certify that this patient is under my care and that I had a face-to-face encounter that meets the physician face-to-face encounter requirements with this patient on this date. The encounter with the patient was in whole or in part for the following MEDICAL CONDITION: (primary reason for [place]) MEDICAL NECESSITY: I certify, that based on my findings, NURSING services are a medically necessary home health service. HOME BOUND STATUS: I certify that my clinical findings support that this patient is homebound (i.e.,  Due to illness or injury, pt requires aid of supportive devices such as crutches, cane, wheelchairs, walkers, the use of special transportation or the assistance of another person to leave their place of residence. There is a normal inability to leave the home and doing so requires considerable and taxing effort. Other absences are for medical reasons / religious services and are infrequent or of short duration when for other reasons). If current dressing causes regression in wound condition, may D/C ordered dressing product/s and apply Normal Saline Moist Dressing daily until next [place] / Other MD appointment. [place] of regression in wound condition at [phone]. Please direct any NON-WOUND related issues/requests for orders to patient's Primary Care Physician Wound #9 Left Metatarsal head first: Continue  [place] Nurse may visit PRN to address patient s wound care needs. VERDELL, DYKMAN ([id number]) FACE TO FACE ENCOUNTER: MEDICARE and MEDICAID PATIENTS: I certify that this patient is under my care and that I had a face-to-face encounter that meets the physician face-to-face encounter requirements with this patient on this date. The encounter with the patient was in whole or in part for the following MEDICAL CONDITION: (primary reason for [place]) MEDICAL NECESSITY: I certify, that based on my findings, NURSING services are a medically necessary home health service. HOME BOUND STATUS: I certify that my clinical findings support that this patient is homebound (i.e., Due to illness or injury, pt requires aid of supportive devices such as crutches, cane, wheelchairs, walkers, the use of special transportation or the assistance of another person to leave their place of residence. There is a normal inability to leave the home and doing so requires considerable and taxing effort. Other absences are for medical reasons / religious  services and are infrequent or of short duration when for other reasons). If current dressing causes regression in wound condition, may D/C ordered dressing product/s and apply Normal Saline Moist Dressing daily until next [place] / Other MD appointment. [place] of regression in wound condition at [phone]. Please direct any NON-WOUND related issues/requests for orders to patient's Primary Care Physician Medical Decision Making Non-pressure chronic ulcer of other part of left foot limited to breakdown of skin 04/08/2017 Status: Improving Complications: healing sandal noncompliance Interventions: the wound is smaller we've been using collagen Non-pressure chronic ulcer of right calf limited to breakdown of skin 04/08/2017 Status: Other Complications: the patient has a new open area on the right anterior tibia area this was a raised nodule last week Interventions: continue Santyl to all wound areas as he has a nonviable surface Non-pressure chronic ulcer of other part of right foot limited to breakdown of skin 04/08/2017 Status: No change Complications: still a very nonviable wound surface Interventions: continued with Santyl I've asked him to see his dermatologist Dr. [place] Crane to see if he has any thoughts on this. Atherosclerosis of native arteries of right leg with ulceration of other part of foot 07/08/2017 Status: Improving Complications: fortunately vein and vascular elected to do an angiogram on this patient Interventions: patient has been revascularized with angioplasties of the proximal right peroneal artery and the tibial peroneal trunk and proximal posterior tibial artery This looks like some form of vasculopathy to me. Laceration without foreign body of right upper arm, subsequent encounter 07/15/2017 Status: Improving Complications: 2 of the 3 areas here if he'll then includes the upper arm above the elbow and the right thumb. He still has an area  that's open on the ventral forearm. This looks better Interventions: using silver collagen #1 the patient asked me today about how long it'll take to heal these, I think the answer is hampered by a lack of the specific diagnosis #2 this looks like some form of vasculopathy to me. Although he is been revascularized by Dr. dew I don't believe this was a primary issue. If this was a microscopic small vessel vasculopathy I can't understand why that would be in 1 leg only in fact I can understand why it's in 1 leg only no matter what the cause. I wonder about another biopsy. I would like Dr. Juanetta Gosling 's dermatologist to have a look at this. See if there are any thoughts. I would like to do this before I  consider biopsying one of these areas. #3 the patient has had an extensive list of investigations done for his constitutional symptoms over the course of this year I'll see if we have imaging studies of the thoracic and/or abdominal aorta although microemboli would be unlikely to localize to one side he has had some on the left foot but not as much as the right all also try to review his angiogram Electronic Signature(s) Signed: 07/24/2017 7:56:50 AM By: Linton Ham MD Entered By: Linton Ham on 07/22/2017 10:08:01 Detwiler, [name] ([id number]) [place], [name] ([id number]) -------------------------------------------------------------------------------- SuperBill Details Patient Name: [name] Date of Service: 07/22/2017 Medical Record Number: [id number] Patient Account Number: [id number] Date of Birth/Sex: Apr 17, 1940 (77 y.o. M) Treating RN: Montey Hora Primary Care Provider: Cyndi Bender Other Clinician: Referring Provider: Cyndi Bender Treating Provider/Extender: Tito Dine in Treatment: 15 Diagnosis Coding ICD-10 Codes Code Description [phone] Non-pressure chronic ulcer of other part of left foot limited to breakdown of skin L97.211 Non-pressure  chronic ulcer of right calf limited to breakdown of skin L97.511 Non-pressure chronic ulcer of other part of right foot limited to breakdown of skin I25.5 Ischemic cardiomyopathy 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 of other part of foot S41.111D Laceration without foreign body of right upper arm, subsequent encounter Facility Procedures CPT4 Code: [id number] Description: 93810 - WOUND CARE VISIT-LEV 5 EST PT Modifier: Quantity: 1 Physician Procedures CPT4 Code Description: [id number] - WC PHYS LEVEL 4 - EST PT ICD-10 Diagnosis Description L97.211 Non-pressure chronic ulcer of right calf limited to breakdown of L97.511 Non-pressure chronic ulcer of other part of right foot limited to I70.235  Atherosclerosis of native arteries of right leg with ulceration o S41.111D Laceration without foreign body of right upper arm, subsequent en Modifier: skin breakdown of sk f other part of counter Quantity: 1 in foot Electronic Signature(s) Signed: 07/24/2017 7:56:50 AM By: Linton Ham MD Entered By: Linton Ham on 07/22/2017 10:08:39

## 2017-07-27 DIAGNOSIS — I5023 Acute on chronic systolic (congestive) heart failure: Secondary | ICD-10-CM | POA: Diagnosis not present

## 2017-07-27 DIAGNOSIS — I2510 Atherosclerotic heart disease of native coronary artery without angina pectoris: Secondary | ICD-10-CM | POA: Diagnosis not present

## 2017-07-27 DIAGNOSIS — I110 Hypertensive heart disease with heart failure: Secondary | ICD-10-CM | POA: Diagnosis not present

## 2017-07-27 DIAGNOSIS — L97511 Non-pressure chronic ulcer of other part of right foot limited to breakdown of skin: Secondary | ICD-10-CM | POA: Diagnosis not present

## 2017-07-27 DIAGNOSIS — L97311 Non-pressure chronic ulcer of right ankle limited to breakdown of skin: Secondary | ICD-10-CM | POA: Diagnosis not present

## 2017-07-27 DIAGNOSIS — I255 Ischemic cardiomyopathy: Secondary | ICD-10-CM | POA: Diagnosis not present

## 2017-07-29 DIAGNOSIS — I255 Ischemic cardiomyopathy: Secondary | ICD-10-CM | POA: Diagnosis not present

## 2017-07-29 DIAGNOSIS — L97311 Non-pressure chronic ulcer of right ankle limited to breakdown of skin: Secondary | ICD-10-CM | POA: Diagnosis not present

## 2017-07-29 DIAGNOSIS — I2510 Atherosclerotic heart disease of native coronary artery without angina pectoris: Secondary | ICD-10-CM | POA: Diagnosis not present

## 2017-07-29 DIAGNOSIS — L97511 Non-pressure chronic ulcer of other part of right foot limited to breakdown of skin: Secondary | ICD-10-CM | POA: Diagnosis not present

## 2017-07-29 DIAGNOSIS — I5023 Acute on chronic systolic (congestive) heart failure: Secondary | ICD-10-CM | POA: Diagnosis not present

## 2017-07-29 DIAGNOSIS — I110 Hypertensive heart disease with heart failure: Secondary | ICD-10-CM | POA: Diagnosis not present

## 2017-07-30 DIAGNOSIS — L97819 Non-pressure chronic ulcer of other part of right lower leg with unspecified severity: Secondary | ICD-10-CM | POA: Diagnosis not present

## 2017-07-30 DIAGNOSIS — L570 Actinic keratosis: Secondary | ICD-10-CM | POA: Diagnosis not present

## 2017-07-30 DIAGNOSIS — L578 Other skin changes due to chronic exposure to nonionizing radiation: Secondary | ICD-10-CM | POA: Diagnosis not present

## 2017-07-30 DIAGNOSIS — D485 Neoplasm of uncertain behavior of skin: Secondary | ICD-10-CM | POA: Diagnosis not present

## 2017-07-30 DIAGNOSIS — L859 Epidermal thickening, unspecified: Secondary | ICD-10-CM | POA: Diagnosis not present

## 2017-07-31 DIAGNOSIS — I5023 Acute on chronic systolic (congestive) heart failure: Secondary | ICD-10-CM | POA: Diagnosis not present

## 2017-07-31 DIAGNOSIS — I255 Ischemic cardiomyopathy: Secondary | ICD-10-CM | POA: Diagnosis not present

## 2017-07-31 DIAGNOSIS — L97311 Non-pressure chronic ulcer of right ankle limited to breakdown of skin: Secondary | ICD-10-CM | POA: Diagnosis not present

## 2017-07-31 DIAGNOSIS — L97511 Non-pressure chronic ulcer of other part of right foot limited to breakdown of skin: Secondary | ICD-10-CM | POA: Diagnosis not present

## 2017-07-31 DIAGNOSIS — I110 Hypertensive heart disease with heart failure: Secondary | ICD-10-CM | POA: Diagnosis not present

## 2017-07-31 DIAGNOSIS — I2510 Atherosclerotic heart disease of native coronary artery without angina pectoris: Secondary | ICD-10-CM | POA: Diagnosis not present

## 2017-08-03 DIAGNOSIS — L97311 Non-pressure chronic ulcer of right ankle limited to breakdown of skin: Secondary | ICD-10-CM | POA: Diagnosis not present

## 2017-08-03 DIAGNOSIS — I2510 Atherosclerotic heart disease of native coronary artery without angina pectoris: Secondary | ICD-10-CM | POA: Diagnosis not present

## 2017-08-03 DIAGNOSIS — I5023 Acute on chronic systolic (congestive) heart failure: Secondary | ICD-10-CM | POA: Diagnosis not present

## 2017-08-03 DIAGNOSIS — L97511 Non-pressure chronic ulcer of other part of right foot limited to breakdown of skin: Secondary | ICD-10-CM | POA: Diagnosis not present

## 2017-08-03 DIAGNOSIS — I255 Ischemic cardiomyopathy: Secondary | ICD-10-CM | POA: Diagnosis not present

## 2017-08-03 DIAGNOSIS — I110 Hypertensive heart disease with heart failure: Secondary | ICD-10-CM | POA: Diagnosis not present

## 2017-08-05 ENCOUNTER — Encounter: Payer: Medicare Other | Admitting: Internal Medicine

## 2017-08-05 DIAGNOSIS — I70235 Atherosclerosis of native arteries of right leg with ulceration of other part of foot: Secondary | ICD-10-CM | POA: Diagnosis not present

## 2017-08-05 DIAGNOSIS — I872 Venous insufficiency (chronic) (peripheral): Secondary | ICD-10-CM | POA: Diagnosis not present

## 2017-08-05 DIAGNOSIS — I255 Ischemic cardiomyopathy: Secondary | ICD-10-CM | POA: Diagnosis not present

## 2017-08-05 DIAGNOSIS — L97312 Non-pressure chronic ulcer of right ankle with fat layer exposed: Secondary | ICD-10-CM | POA: Diagnosis not present

## 2017-08-05 DIAGNOSIS — L97511 Non-pressure chronic ulcer of other part of right foot limited to breakdown of skin: Secondary | ICD-10-CM | POA: Diagnosis not present

## 2017-08-05 DIAGNOSIS — I70233 Atherosclerosis of native arteries of right leg with ulceration of ankle: Secondary | ICD-10-CM | POA: Diagnosis not present

## 2017-08-05 DIAGNOSIS — L97812 Non-pressure chronic ulcer of other part of right lower leg with fat layer exposed: Secondary | ICD-10-CM | POA: Diagnosis not present

## 2017-08-05 DIAGNOSIS — L97512 Non-pressure chronic ulcer of other part of right foot with fat layer exposed: Secondary | ICD-10-CM | POA: Diagnosis not present

## 2017-08-05 DIAGNOSIS — I70234 Atherosclerosis of native arteries of right leg with ulceration of heel and midfoot: Secondary | ICD-10-CM | POA: Diagnosis not present

## 2017-08-07 DIAGNOSIS — I5023 Acute on chronic systolic (congestive) heart failure: Secondary | ICD-10-CM | POA: Diagnosis not present

## 2017-08-07 DIAGNOSIS — L97511 Non-pressure chronic ulcer of other part of right foot limited to breakdown of skin: Secondary | ICD-10-CM | POA: Diagnosis not present

## 2017-08-07 DIAGNOSIS — I110 Hypertensive heart disease with heart failure: Secondary | ICD-10-CM | POA: Diagnosis not present

## 2017-08-07 DIAGNOSIS — L97311 Non-pressure chronic ulcer of right ankle limited to breakdown of skin: Secondary | ICD-10-CM | POA: Diagnosis not present

## 2017-08-07 DIAGNOSIS — I255 Ischemic cardiomyopathy: Secondary | ICD-10-CM | POA: Diagnosis not present

## 2017-08-07 DIAGNOSIS — I2510 Atherosclerotic heart disease of native coronary artery without angina pectoris: Secondary | ICD-10-CM | POA: Diagnosis not present

## 2017-08-10 ENCOUNTER — Ambulatory Visit
Admission: RE | Admit: 2017-08-10 | Discharge: 2017-08-10 | Disposition: A | Discharge status: Home / Self Care | Payer: Medicare Other | Source: Ambulatory Visit | Attending: Vascular Surgery | Admitting: Vascular Surgery

## 2017-08-10 DIAGNOSIS — I2510 Atherosclerotic heart disease of native coronary artery without angina pectoris: Secondary | ICD-10-CM | POA: Diagnosis not present

## 2017-08-10 DIAGNOSIS — I7025 Atherosclerosis of native arteries of other extremities with ulceration: Secondary | ICD-10-CM | POA: Diagnosis not present

## 2017-08-10 DIAGNOSIS — Z8711 Personal history of peptic ulcer disease: Secondary | ICD-10-CM | POA: Diagnosis not present

## 2017-08-10 DIAGNOSIS — I255 Ischemic cardiomyopathy: Secondary | ICD-10-CM | POA: Diagnosis not present

## 2017-08-10 DIAGNOSIS — I110 Hypertensive heart disease with heart failure: Secondary | ICD-10-CM | POA: Diagnosis not present

## 2017-08-10 DIAGNOSIS — L97511 Non-pressure chronic ulcer of other part of right foot limited to breakdown of skin: Secondary | ICD-10-CM | POA: Diagnosis not present

## 2017-08-10 DIAGNOSIS — I5023 Acute on chronic systolic (congestive) heart failure: Secondary | ICD-10-CM | POA: Diagnosis not present

## 2017-08-10 DIAGNOSIS — L97311 Non-pressure chronic ulcer of right ankle limited to breakdown of skin: Secondary | ICD-10-CM | POA: Diagnosis not present

## 2017-08-11 ENCOUNTER — Ambulatory Visit (INDEPENDENT_AMBULATORY_CARE_PROVIDER_SITE_OTHER): Payer: Medicare Other | Admitting: Vascular Surgery

## 2017-08-11 ENCOUNTER — Encounter (INDEPENDENT_AMBULATORY_CARE_PROVIDER_SITE_OTHER): Payer: Self-pay | Admitting: Vascular Surgery

## 2017-08-11 VITALS — BP 96/57 | HR 52 | Resp 12 | Ht 66.0 in | Wt 166.0 lb

## 2017-08-11 DIAGNOSIS — I739 Peripheral vascular disease, unspecified: Secondary | ICD-10-CM | POA: Insufficient documentation

## 2017-08-11 DIAGNOSIS — L98499 Non-pressure chronic ulcer of skin of other sites with unspecified severity: Secondary | ICD-10-CM | POA: Diagnosis not present

## 2017-08-11 DIAGNOSIS — E785 Hyperlipidemia, unspecified: Secondary | ICD-10-CM | POA: Diagnosis not present

## 2017-08-11 DIAGNOSIS — R21 Rash and other nonspecific skin eruption: Secondary | ICD-10-CM | POA: Diagnosis not present

## 2017-08-11 DIAGNOSIS — I890 Lymphedema, not elsewhere classified: Secondary | ICD-10-CM

## 2017-08-11 DIAGNOSIS — I10 Essential (primary) hypertension: Secondary | ICD-10-CM

## 2017-08-11 DIAGNOSIS — I7025 Atherosclerosis of native arteries of other extremities with ulceration: Secondary | ICD-10-CM | POA: Diagnosis not present

## 2017-08-11 DIAGNOSIS — L988 Other specified disorders of the skin and subcutaneous tissue: Secondary | ICD-10-CM | POA: Diagnosis not present

## 2017-08-11 DIAGNOSIS — I8311 Varicose veins of right lower extremity with inflammation: Secondary | ICD-10-CM | POA: Diagnosis not present

## 2017-08-11 DIAGNOSIS — D485 Neoplasm of uncertain behavior of skin: Secondary | ICD-10-CM | POA: Diagnosis not present

## 2017-08-11 NOTE — Patient Instructions (Signed)
Peripheral Vascular Disease Peripheral vascular disease (PVD) is a disease of the blood vessels that are not part of your heart and brain. A simple term for PVD is poor circulation. In most cases, PVD narrows the blood vessels that carry blood from your heart to the rest of your body. This can result in a decreased supply of blood to your arms, legs, and internal organs, like your stomach or kidneys. However, it most often affects a person's lower legs and feet. There are two types of PVD.  Organic PVD. This is the more common type. It is caused by damage to the structure of blood vessels.  Functional PVD. This is caused by conditions that make blood vessels contract and tighten (spasm).  Without treatment, PVD tends to get worse over time. PVD can also lead to acute ischemic limb. This is when an arm or limb suddenly has trouble getting enough blood. This is a medical emergency. What are the causes? Each type of PVD has many different causes. The most common cause of PVD is buildup of a fatty material (plaque) inside of your arteries (atherosclerosis). Small amounts of plaque can break off from the walls of the blood vessels and become lodged in a smaller artery. This blocks blood flow and can cause acute ischemic limb. Other common causes of PVD include:  Blood clots that form inside of blood vessels.  Injuries to blood vessels.  Diseases that cause inflammation of blood vessels or cause blood vessel spasms.  Health behaviors and health history that increase your risk of developing PVD.  What increases the risk? You may have a greater risk of PVD if you:  Have a family history of PVD.  Have certain medical conditions, including: ? High cholesterol. ? Diabetes. ? High blood pressure (hypertension). ? Coronary heart disease. ? Past problems with blood clots. ? Past injury, such as burns or a broken bone. These may have damaged blood vessels in your limbs. ? Buerger disease. This is  caused by inflamed blood vessels in your hands and feet. ? Some forms of arthritis. ? Rare birth defects that affect the arteries in your legs.  Use tobacco.  Do not get enough exercise.  Are obese.  Are age 50 or older.  What are the signs or symptoms? PVD may cause many different symptoms. Your symptoms depend on what part of your body is not getting enough blood. Some common signs and symptoms include:  Cramps in your lower legs. This may be a symptom of poor leg circulation (claudication).  Pain and weakness in your legs while you are physically active that goes away when you rest (intermittent claudication).  Leg pain when at rest.  Leg numbness, tingling, or weakness.  Coldness in a leg or foot, especially when compared with the other leg.  Skin or hair changes. These can include: ? Hair loss. ? Shiny skin. ? Pale or bluish skin. ? Thick toenails.  Inability to get or maintain an erection (erectile dysfunction).  People with PVD are more prone to developing ulcers and sores on their toes, feet, or legs. These may take longer than normal to heal. How is this diagnosed? Your health care provider may diagnose PVD from your signs and symptoms. The health care provider will also do a physical exam. You may have tests to find out what is causing your PVD and determine its severity. Tests may include:  Blood pressure recordings from your arms and legs and measurements of the strength of your pulses (  pulse volume recordings).  Imaging studies using sound waves to take pictures of the blood flow through your blood vessels (Doppler ultrasound).  Injecting a dye into your blood vessels before having imaging studies using: ? X-rays (angiogram or arteriogram). ? Computer-generated X-rays (CT angiogram). ? A powerful electromagnetic field and a computer (magnetic resonance angiogram or MRA).  How is this treated? Treatment for PVD depends on the cause of your condition and the  severity of your symptoms. It also depends on your age. Underlying causes need to be treated and controlled. These include long-lasting (chronic) conditions, such as diabetes, high cholesterol, and high blood pressure. You may need to first try making lifestyle changes and taking medicines. Surgery may be needed if these do not work. Lifestyle changes may include:  Quitting smoking.  Exercising regularly.  Following a low-fat, low-cholesterol diet.  Medicines may include:  Blood thinners to prevent blood clots.  Medicines to improve blood flow.  Medicines to improve your blood cholesterol levels.  Surgical procedures may include:  A procedure that uses an inflated balloon to open a blocked artery and improve blood flow (angioplasty).  A procedure to put in a tube (stent) to keep a blocked artery open (stent implant).  Surgery to reroute blood flow around a blocked artery (peripheral bypass surgery).  Surgery to remove dead tissue from an infected wound on the affected limb.  Amputation. This is surgical removal of the affected limb. This may be necessary in cases of acute ischemic limb that are not improved through medical or surgical treatments.  Follow these instructions at home:  Take medicines only as directed by your health care provider.  Do not use any tobacco products, including cigarettes, chewing tobacco, or electronic cigarettes. If you need help quitting, ask your health care provider.  Lose weight if you are overweight, and maintain a healthy weight as directed by your health care provider.  Eat a diet that is low in fat and cholesterol. If you need help, ask your health care provider.  Exercise regularly. Ask your health care provider to suggest some good activities for you.  Use compression stockings or other mechanical devices as directed by your health care provider.  Take good care of your feet. ? Wear comfortable shoes that fit well. ? Check your feet  often for any cuts or sores. Contact a health care provider if:  You have cramps in your legs while walking.  You have leg pain when you are at rest.  You have coldness in a leg or foot.  Your skin changes.  You have erectile dysfunction.  You have cuts or sores on your feet that are not healing. Get help right away if:  Your arm or leg turns cold and blue.  Your arms or legs become red, warm, swollen, painful, or numb.  You have chest pain or trouble breathing.  You suddenly have weakness in your face, arm, or leg.  You become very confused or lose the ability to speak.  You suddenly have a very bad headache or lose your vision. This information is not intended to replace advice given to you by your health care provider. Make sure you discuss any questions you have with your health care provider. Document Released: 01/31/2004 Document Revised: 05/31/2015 Document Reviewed: 06/02/2013 Elsevier Interactive Patient Education  2017 [employer]  

## 2017-08-11 NOTE — Assessment & Plan Note (Signed)
Swelling fairly well controlled with stockings

## 2017-08-11 NOTE — Assessment & Plan Note (Signed)
Noninvasive studies performed at the hospital were quite limited.  Basically, they reported that ABIs were non-calculable but the waveforms were more reduced on the left than the right. His wounds are healing and he has clinically had a good response to right leg revascularization.  I will plan to recheck him in about 3 months.  If his left leg remains worsening in terms of symptoms, we may consider intervention on the left leg at that time.

## 2017-08-11 NOTE — Assessment & Plan Note (Signed)
blood pressure control important in reducing the progression of atherosclerotic disease. On appropriate oral medications.  

## 2017-08-11 NOTE — Progress Notes (Signed)
MRN : [id number]  [name] is a 77 y.o. (01-16-1940) male who presents with chief complaint of  Chief Complaint  Patient presents with  . Follow-up    [place] result follow up  .  History of Present Illness: Patient returns today in follow up of PAD.  He underwent revascularization of the right lower extremity about a month ago.  He actually says the right foot feels better and has less pain than before.  There is also less numbness and tingling than before.  He is actually complaining of more of that on the left now.  His ulcers are slowly healing on that right leg.  Noninvasive studies performed at the hospital were quite limited.  Basically, they reported that ABIs were non-calculable but the waveforms were more reduced on the left than the right.  Current Outpatient Medications  Medication Sig Dispense Refill  . acetaminophen (TYLENOL 8 HOUR ARTHRITIS PAIN) 650 MG CR tablet Take 650-1,300 mg by mouth every 8 (eight) hours as needed (pain/headaches.).    [name] amiodarone (PACERONE) 200 MG tablet Take 200 mg by mouth at bedtime.     [name] aspirin EC 81 MG tablet Take 81 mg by mouth daily.    . cetirizine (ZYRTEC) 10 MG tablet Take 10 mg by mouth daily.    . Cholecalciferol (VITAMIN D3) 2000 UNITS TABS Take 2,000 Units by mouth daily.     . clopidogrel (PLAVIX) 75 MG tablet Take 1 tablet (75 mg total) by mouth daily. 30 tablet 11  . ENTRESTO 24-26 MG Take 1 tablet by mouth every 12 (twelve) hours.  11  . furosemide (LASIX) 40 MG tablet Take 20-40 mg by mouth daily as needed for edema (for fluid retention.).     [name] leflunomide (ARAVA) 10 MG tablet Take 10 mg by mouth daily.  2  . levothyroxine (SYNTHROID, LEVOTHROID) 50 MCG tablet Take 50 mcg by mouth daily before breakfast.     . metoprolol succinate (TOPROL-XL) 25 MG 24 hr tablet Take 25 mg by mouth 2 (two) times daily.     [name] mexiletine (MEXITIL) 150 MG capsule Take 150 mg by mouth every 8 (eight) hours.     . nitroGLYCERIN (NITROSTAT) 0.4  MG SL tablet Place 0.4 mg under the tongue every 5 (five) minutes as needed for chest pain.    . Polyethylene Glycol 3350 (PEG 3350) POWD Take 17 g by mouth daily as needed (for constipation.).     [name] potassium chloride (K-DUR) 10 MEQ tablet Take 10 mEq by mouth daily.     [name] SANTYL ointment Apply 1 application topically 3 (three) times a week. Apply a "nickel thick" amount to wound as directed  1  . traMADol (ULTRAM) 50 MG tablet Take 1 tablet by mouth 3 (three) times daily as needed (for pan.).     [name] vitamin B-12 (CYANOCOBALAMIN) 1000 MCG tablet Take 1,000 mcg by mouth daily.    [name] docusate sodium (COLACE) 100 MG capsule Take 100-200 mg by mouth daily as needed (for constipation.).      No current facility-administered medications for this visit.     Past Medical History:  Diagnosis Date  . AICD (automatic cardioverter/defibrillator) present    [employer]  . Arthritis    RA  . Balance problem   . CHF (congestive heart failure) ([place])   . Collagen vascular disease ([place])    RA since 2014  . Coronary artery disease   . Deviated nasal septum   .  Dysrhythmia   . Hearing loss    TINNITUS  . High cholesterol   . Hypothyroidism   . Leg weakness   . Myocardial infarction ([place])   . Presence of permanent cardiac pacemaker    WITH DEFIBRILATOR ([employer])  . Seasonal allergies    RHINITIS  . Shortness of breath dyspnea    COUGH  . Sinus headache   . Sinusitis    CHRONIC  . Sleep apnea   . Vertigo    DYSEQUILIBRIUM    Past Surgical History:  Procedure Laterality Date  . CARDIAC ELECTROPHYSIOLOGY STUDY AND ABLATION    . CARDIAC SURGERY    . CATARACT EXTRACTION     BILATERAL  . CORONARY ANGIOPLASTY     STENTS  . CORONARY ARTERY BYPASS GRAFT  1992  . heart pump    . INSERT / REPLACE / REMOVE PACEMAKER    . LOWER EXTREMITY ANGIOGRAPHY Right 07/06/2017   Procedure: LOWER EXTREMITY ANGIOGRAPHY;  Surgeon: Algernon Huxley, MD;  Location: [facility];  Service:  Cardiovascular;  Laterality: Right;  . SEPTOPLASTY      Social History Social History   Tobacco Use  . Smoking status: Never Smoker  . Smokeless tobacco: Never Used  Substance Use Topics  . Alcohol use: No    Alcohol/week: 0.0 oz  . Drug use: No     Family History Family History  Problem Relation Age of Onset  . Alzheimer's disease Mother     Allergies  Allergen Reactions  . Bactrim [Sulfamethoxazole-Trimethoprim] Other (See Comments)    Severe dehydration  . Doxycycline Other (See Comments)    sunburn  . Pravastatin Other (See Comments)    Reaction:  Joint pain      REVIEW OF SYSTEMS (Negative unless checked)  Constitutional: [] Weight loss  [] Fever  [] Chills Cardiac: [] Chest pain   [] Chest pressure   [] Palpitations   [] Shortness of breath when laying flat   [] Shortness of breath at rest   [] Shortness of breath with exertion. Vascular:  [] Pain in legs with walking   [] Pain in legs at rest   [] Pain in legs when laying flat   [] Claudication   [] Pain in feet when walking  [] Pain in feet at rest  [] Pain in feet when laying flat   [] History of DVT   [] Phlebitis   [x] Swelling in legs   [] Varicose veins   [x] Non-healing ulcers Pulmonary:   [] Uses home oxygen   [] Productive cough   [] Hemoptysis   [] Wheeze  [] COPD   [] Asthma Neurologic:  [] Dizziness  [] Blackouts   [] Seizures   [] History of stroke   [] History of TIA  [] Aphasia   [] Temporary blindness   [] Dysphagia   [] Weakness or numbness in arms   [] Weakness or numbness in legs Musculoskeletal:  [x] Arthritis   [] Joint swelling   [] Joint pain   [] Low back pain Hematologic:  [] Easy bruising  [] Easy bleeding   [] Hypercoagulable state   [] Anemic   Gastrointestinal:  [] Blood in stool   [] Vomiting blood  [] Gastroesophageal reflux/heartburn   [] Abdominal pain Genitourinary:  [] Chronic kidney disease   [] Difficult urination  [] Frequent urination  [] Burning with urination   [] Hematuria Skin:  [] Rashes   [x] Ulcers    [x] Wounds Psychological:  [] History of anxiety   []  History of major depression.  Physical Examination  BP (!) 96/57 (BP Location: Right Arm, Patient Position: Sitting)   Pulse (!) 52   Resp 12   Ht 5' 6"  (1.676 m)   Wt 166 lb (75.3 kg)  BMI 26.79 kg/m  Gen:  WD/WN, NAD Head: [place]/AT, No temporalis wasting. Ear/Nose/Throat: Hearing grossly intact, nares w/o erythema or drainage Eyes: Conjunctiva clear. Sclera non-icteric Neck: Supple.  Trachea midline Pulmonary:  Good air movement, no use of accessory muscles.  Cardiac: RRR, no JVD Vascular:  Vessel Right Left  Radial Palpable Palpable                          PT  not palpable  1+ palpable  DP Palpable Not Palpable   Gastrointestinal: soft, non-tender/non-distended. No guarding/reflex.  Musculoskeletal: M/S 5/5 throughout.  No deformity or atrophy.  1+ right lower extremity edema, trace left lower extremity edema Neurologic: Sensation grossly intact in extremities.  Symmetrical.  Speech is fluent.  Psychiatric: Judgment intact, Mood & affect appropriate for pt's clinical situation. Dermatologic: Several superficial wounds in the calf ankle and foot area on the right       Labs Recent Results (from the past 2160 hour(s))  Aerobic Culture (superficial specimen)     Status: None   Collection Time: 06/03/17 11:55 AM  Result Value Ref Range   Specimen Description WOUND RIGHT FOOT LATERAL    Special Requests NONE    Gram Stain      NO WBC SEEN FEW YEAST Performed at [facility], [address], [place], [place] [address]    Culture FEW CANDIDA ALBICANS FEW ENTEROCOCCUS FAECALIS     Report Status 06/07/2017 FINAL    Organism ID, Bacteria ENTEROCOCCUS FAECALIS       Susceptibility   Enterococcus faecalis - MIC*    AMPICILLIN <=2 SENSITIVE Sensitive     VANCOMYCIN 1 SENSITIVE Sensitive     GENTAMICIN SYNERGY RESISTANT Resistant     * FEW ENTEROCOCCUS FAECALIS  BUN     Status: None   Collection Time:  07/03/17 10:31 AM  Result Value Ref Range   BUN 12 8 - 23 mg/dL    Comment: Please note change in reference range. Performed at [facility], [address] [place], [place] [address]   Creatinine, serum     Status: None   Collection Time: 07/03/17 10:31 AM  Result Value Ref Range   Creatinine, Ser 1.00 0.61 - 1.24 mg/dL   GFR calc non Af Amer >60 >60 mL/min   GFR calc Af Amer >60 >60 mL/min    Comment: (NOTE) The eGFR has been calculated using the CKD EPI equation. This calculation has not been validated in all clinical situations. eGFR's persistently <60 mL/min signify possible Chronic Kidney Disease. Performed at [facility], [address] [place], [place] [address]     Radiology US Arterial Burnard Bunting (screening Lower Extremity)  Result Date: 08/10/2017 CLINICAL DATA:  77 year old male with a history of ulcers. Cardiovascular risk factors include known vascular disease EXAM: NONINVASIVE PHYSIOLOGIC VASCULAR STUDY OF BILATERAL LOWER EXTREMITIES TECHNIQUE: Evaluation of both lower extremities was performed at rest, including calculation of ankle-brachial indices, multiple segmental pressure evaluation, segmental Doppler and segmental pulse volume recording. COMPARISON:  None. FINDINGS: Right ABI:  Non calculable Left ABI:  Non calculable Right Lower Extremity: Segmental Doppler at the right ankle demonstrates biphasic waveform of the posterior tibial artery and dorsalis pedis Left Lower Extremity: Segmental Doppler at the left ankle demonstrates monophasic dorsalis pedis and posterior tibial arteries. IMPRESSION: Resting ABI the bilateral lower extremities are non calculable. Segmental Doppler at the ankles demonstrates abnormal waveforms of the tibial arteries, which appears worst on the  left. Further evaluation with a complete segmental exam may be considered, alternatively directed duplex, or anatomic imaging with CT angiogram and runoff. Signed, Dulcy Fanny. Dellia Nims,  R[facility] Radiology Electronically Signed   By: Corrie Mckusick D.O.   On: 08/10/2017 14:21    Assessment/Plan  Essential (primary) hypertension blood pressure control important in reducing the progression of atherosclerotic disease. On appropriate oral medications.   Lymphedema Swelling fairly well controlled with stockings  PAD (peripheral artery disease) ([place]) Noninvasive studies performed at the hospital were quite limited.  Basically, they reported that ABIs were non-calculable but the waveforms were more reduced on the left than the right. His wounds are healing and he has clinically had a good response to right leg revascularization.  I will plan to recheck him in about 3 months.  If his left leg remains worsening in terms of symptoms, we may consider intervention on the left leg at that time.    [name], MD  08/11/2017 11:17 AM    This note was created with Dragon medical transcription system.  Any errors from dictation are purely unintentional

## 2017-08-12 DIAGNOSIS — L97511 Non-pressure chronic ulcer of other part of right foot limited to breakdown of skin: Secondary | ICD-10-CM | POA: Diagnosis not present

## 2017-08-12 DIAGNOSIS — I110 Hypertensive heart disease with heart failure: Secondary | ICD-10-CM | POA: Diagnosis not present

## 2017-08-12 DIAGNOSIS — I2510 Atherosclerotic heart disease of native coronary artery without angina pectoris: Secondary | ICD-10-CM | POA: Diagnosis not present

## 2017-08-12 DIAGNOSIS — I255 Ischemic cardiomyopathy: Secondary | ICD-10-CM | POA: Diagnosis not present

## 2017-08-12 DIAGNOSIS — L97311 Non-pressure chronic ulcer of right ankle limited to breakdown of skin: Secondary | ICD-10-CM | POA: Diagnosis not present

## 2017-08-12 DIAGNOSIS — I5023 Acute on chronic systolic (congestive) heart failure: Secondary | ICD-10-CM | POA: Diagnosis not present

## 2017-08-14 DIAGNOSIS — L97511 Non-pressure chronic ulcer of other part of right foot limited to breakdown of skin: Secondary | ICD-10-CM | POA: Diagnosis not present

## 2017-08-14 DIAGNOSIS — I2510 Atherosclerotic heart disease of native coronary artery without angina pectoris: Secondary | ICD-10-CM | POA: Diagnosis not present

## 2017-08-14 DIAGNOSIS — I5023 Acute on chronic systolic (congestive) heart failure: Secondary | ICD-10-CM | POA: Diagnosis not present

## 2017-08-14 DIAGNOSIS — I110 Hypertensive heart disease with heart failure: Secondary | ICD-10-CM | POA: Diagnosis not present

## 2017-08-14 DIAGNOSIS — I255 Ischemic cardiomyopathy: Secondary | ICD-10-CM | POA: Diagnosis not present

## 2017-08-14 DIAGNOSIS — L97311 Non-pressure chronic ulcer of right ankle limited to breakdown of skin: Secondary | ICD-10-CM | POA: Diagnosis not present

## 2017-08-16 NOTE — Progress Notes (Signed)
TRAVERS, GOODLEY ([id number]) Visit Report for 08/05/2017 Debridement Details Patient Name: [name], [name] Date of Service: 08/05/2017 8:45 AM Medical Record Number: [id number] Patient Account Number: [id number] Date of Birth/Sex: 11/22/1940 (76 y.o. M) Treating RN: Cornell Barman Primary Care Provider: Cyndi Bender Other Clinician: Referring Provider: Cyndi Bender Treating Provider/Extender: Tito Dine in Treatment: 17 Debridement Performed for Wound #15 Right,Proximal,Anterior Lower Leg Assessment: Performed By: Physician Ricard Dillon, MD Debridement Type: Debridement Severity of Tissue Pre Fat layer exposed Debridement: Pre-procedure Verification/Time Yes - 09:49 Out Taken: Start Time: 09:49 Pain Control: Other : lidocaine 4% Total Area Debrided (L x W): 1 (cm) x 0.7 (cm) = 0.7 (cm) Tissue and other material Non-Viable, Eschar, Subcutaneous debrided: Level: Skin/Subcutaneous Tissue Debridement Description: Excisional Instrument: Curette Bleeding: Minimum Hemostasis Achieved: Pressure End Time: 09:51 Response to Treatment: Procedure was tolerated well Level of Consciousness: Awake and Alert Post Debridement Measurements of Total Wound Length: (cm) 1 Width: (cm) 0.7 Depth: (cm) 0.2 Volume: (cm) 0.11 Character of Wound/Ulcer Post Debridement: Requires Further Debridement Severity of Tissue Post Debridement: Fat layer exposed Post Procedure Diagnosis Same as Pre-procedure Electronic Signature(s) Signed: 08/05/2017 6:17:43 PM By: Linton Ham MD Signed: 08/07/2017 6:17:48 PM By: Gretta Cool, BSN, RN, CWS, Kim RN, BSN Entered By: Linton Ham on 08/05/2017 10:50:11 [place], [name] ([id number]) -------------------------------------------------------------------------------- Debridement Details Patient Name: [name] Date of Service: 08/05/2017 8:45 AM Medical Record Number: [id number] Patient Account Number: [id number] Date of  Birth/Sex: [birth year] (76 y.o. M) Treating RN: Cornell Barman Primary Care Provider: Cyndi Bender Other Clinician: Referring Provider: Cyndi Bender Treating Provider/Extender: Tito Dine in Treatment: 17 Debridement Performed for Wound #6 Right,Lateral Malleolus Assessment: Performed By: Physician Ricard Dillon, MD Debridement Type: Debridement Severity of Tissue Pre Fat layer exposed Debridement: Pre-procedure Verification/Time Yes - 09:49 Out Taken: Start Time: 09:49 Pain Control: Other : lidocaine 4% Total Area Debrided (L x W): 0.4 (cm) x 0.4 (cm) = 0.16 (cm) Tissue and other material Non-Viable, Slough, Subcutaneous, Slough debrided: Level: Skin/Subcutaneous Tissue Debridement Description: Excisional Instrument: Curette Bleeding: Minimum Hemostasis Achieved: Pressure End Time: 09:51 Response to Treatment: Procedure was tolerated well Level of Consciousness: Awake and Alert Post Debridement Measurements of Total Wound Length: (cm) 0.4 Width: (cm) 0.4 Depth: (cm) 0.3 Volume: (cm) 0.038 Character of Wound/Ulcer Post Debridement: Requires Further Debridement Severity of Tissue Post Debridement: Fat layer exposed Post Procedure Diagnosis Same as Pre-procedure Electronic Signature(s) Signed: 08/05/2017 6:17:43 PM By: Linton Ham MD Signed: 08/07/2017 6:17:48 PM By: Gretta Cool, BSN, RN, CWS, Kim RN, BSN Entered By: Linton Ham on 08/05/2017 10:50:39 [place], [name] ([id number]) -------------------------------------------------------------------------------- Debridement Details Patient Name: [name] Date of Service: 08/05/2017 8:45 AM Medical Record Number: [id number] Patient Account Number: [id number] Date of Birth/Sex: 09/26/1940 (76 y.o. M) Treating RN: Cornell Barman Primary Care Provider: Cyndi Bender Other Clinician: Referring Provider: Cyndi Bender Treating Provider/Extender: Tito Dine in Treatment:  17 Debridement Performed for Wound #8 Right,Lateral [name] Assessment: Performed By: Physician Ricard Dillon, MD Debridement Type: Debridement Severity of Tissue Pre Fat layer exposed Debridement: Pre-procedure Verification/Time Yes - 09:49 Out Taken: Start Time: 09:49 Pain Control: Other : lidocaine 4% Total Area Debrided (L x W): 0.8 (cm) x 0.8 (cm) = 0.64 (cm) Tissue and other material Non-Viable, Slough, Subcutaneous, Slough debrided: Level: Skin/Subcutaneous Tissue Debridement Description: Excisional Instrument: Curette Bleeding: Minimum Hemostasis Achieved: Pressure End Time: 09:51 Response to Treatment: Procedure was tolerated well Level of Consciousness: Awake and Alert Post Debridement Measurements of  Total Wound Length: (cm) 0.8 Width: (cm) 0.8 Depth: (cm) 0.4 Volume: (cm) 0.201 Character of Wound/Ulcer Post Debridement: Requires Further Debridement Severity of Tissue Post Debridement: Fat layer exposed Post Procedure Diagnosis Same as Pre-procedure Electronic Signature(s) Signed: 08/05/2017 6:17:43 PM By: Linton Ham MD Signed: 08/07/2017 6:17:48 PM By: Gretta Cool, BSN, RN, CWS, Kim RN, BSN Entered By: Linton Ham on 08/05/2017 10:51:09 [place], [name] ([id number]) -------------------------------------------------------------------------------- HPI Details Patient Name: [name] Date of Service: 08/05/2017 8:45 AM Medical Record Number: [id number] Patient Account Number: [id number] Date of Birth/Sex: [birth year] (76 y.o. M) Treating RN: Cornell Barman Primary Care Provider: Cyndi Bender Other Clinician: Referring Provider: Cyndi Bender Treating Provider/Extender: Tito Dine in Treatment: 17 History of Present Illness HPI Description: 04/08/17; this is a complex 77 year old man referred here from [facility]. He had been referred there for bilateral lower extremity edema with ulcer formation predominantly on the  right calf but also the right [name]. He had been receiving Unna boots bilaterally. The history here is long. He is not a diabetic however ICU looking through late 2018 he was worked up for chronic headaches, elevated inflammatory markers including C-reactive protein and ESR. He went on to actually have a left temporal artery biopsy wasn't that was negative.he received about 6 weeks of high-dose prednisone 60 mg with improvement in his inflammatory markers. He was admitted to hospital in late November with ventricular tachycardia syncope. He has known ischemic cardiomyopathy. He was admitted in the hospital in mid December. Apparently this was precipitated by a syncopal spell falling out of his scooter while at [place]. There was ventricular arrhythmia. He has an implantable defibrillator and echocardiogram showed severe LV dysfunction with an EF of 20% and valvular regurgitations including mild AR, moderate MR. There was no stenosis. He ruled in for a non-ST elevation MI in the setting of V. tach.his wife states that sometime during this hospitalization she noted multiple areas of skin change on the right lower calf which became evident just after he left the hospital. He was back in hospital in February with acute renal failure hyponatremia. This responded to fluid resuscitation.. Interestingly I can't see much description of his right leg at that point in time.he was followed by Dr. Nehemiah Massed of dermatology for the necrotic wounds on his right leg. Apparently a biopsy was planned at one point but not done although in some notes that suggests it was. I cannot see these results area He was noted to have a lot of edema. Was treated with bilateral Unna boots edges really helped with the swelling they have been using Bactroban to small open areas predominantly on the right anterior lower leg His history is complicated by the fact that he has rheumatoid arthritis followed by rheumatology. He is followed  by neurology for disabling headaches. At one point this was felt to be giant cell arteritis although a left temporal biopsy was apparently negative. He was given a prolonged course of prednisone at 60 mg which managed his sedimentation rates but apparently did not prove improve the headaches. This is been tapered to off on by rheumatology on 03/13/17 Vascular had plans to do a venous reflux workup as well as arterial studies in May. They also wanted to get him a lymphedema pump. As mentioned he's been using bacitracin under Unna boot wraps to both lower legs 04/15/17; the patient arrives with most of his wounds improved. These are small punched out wounds. Most of them remaining ones are on the  right anterior calf with the most problematic over the right lateral malleolus. There are no new areas. The symptom complex or potential symptom complex we are dealing with his chronic disabling headaches with inflammatory markers not responsive to prednisone and with a negative temporal biopsy, lower extremity weakness, skin ulcerations just on the right leg. We have managed to get his arterial studies moved up to April 30. He has a rheumatology consult at [facility] in June. He has seen dermatology locally, rheumatology locally, neurology locally. 04/22/17; small punched out areas on the right leg anteriorly posteriorly. Most of these appear to have closed over. Some of them have eschar over the surface. The most problematic area appears to be over the right lateral malleolus. We've been using prisma to all of this. He has arterial studies on April 30 and a rheumatology consult at [facility] on June 28 04/29/17; most of the small punched out areas on the right leg posteriorly are closed. He has 2 or 3 openings anteriorly but most of these appear to be on the way to closing. Still problematically over the right lateral malleolus and right lateral [name] with almost ischemic-looking eschar. His arterial studies that I ordered  are due to be done next week on the 30th so we should have them available for our next visit hopefully. He also saw a rheumatologist at [facility] and according to the patient he did 8 vials of blood. Finally he has scaling rash on his left [name] and what looks to be at [facility] area on the left anterior leg 05/06/17; most of the small punched out areas on the right leg posteriorly and anteriorly are closed. He still has one small one anteriorly one over the right lateral malleolus and one over the right lateral [name]. ROLLY, MAGRI ([id number]) He had his arterial studies they didn't seem to do waveform analysis not exactly sure why however in any case is ABIs were noncompressible bilaterally. They did provide TBIs although looking at the pressures it appears that his TBIs are quite normal. I therefore went ahead and debrided the area over the right lateral malleolus and the right lateral [name] 05/13/17; most of the small punched out areas on the right leg posteriorly and anteriorly are closed. He continues to have problematic areas over the right lateral malleolus and the right lateral [name]. He still requiring aggressive debridement of these 2 wounds using silver collagen I reviewed the note from rheumatology I don't think they came up with a specific diagnosis although he is known to have seropositive RA. They did a panel of lab work when I was able to see his his AMA was negative, anti-smooth muscle antibodies negative antineutrophil cytoplasmic antibodies negative,Liv/kia type 1 negative. Serum C3 and C4 were negative. I don't see his muscle enzymes specifically. He was referred back to his local rheumatologist for management of his known rheumatoid arthritis.the patient states he still feels weak and fatigued. He states he has numbness in both feet and apparently is known to have neuropathy 05/20/17; the patient continues to have a difficult problem on the right lateral malleolus and the right lateral  [name]. Both of these wounds have no viable surface even with attempts at debridement. He has a new wound on the left plantar metatarsal head which looks more like a superficial diabetic pressure related injury then part of this underlying issue he has. Most of the rest of the wounds on his legs look satisfactory. Mostly on the right calf.reviewed his arterial studies which showed  noncompressible vessels bilaterally but the TBIs were quite normal. 05/27/17; no real improvement in the right lateral malleolus and right lateral [name]. In fact the right lateral [name] is now on bone. I gave him doxycycline empirically last week it appears that he developed photosensitivity was 7 the son in a tractor. I'll not give him any more of this. This is predominantly on his face and dorsal forearms and hands. I given this more as an anti- inflammatory. I'm going to have him seen by vascular surgery. His TBIs that he had done previously ordered by Dr. Brigitte Pulse were in the normal range They never did full arterial studies on him. he now has small punched out wounds on the right lateral ankle and right lateral [name]. I doubt these are ischemic however I would like a review of his macrovascular status. He does describe some pain at night. I'll reduce the compression from 3 layer to 2 layer and I'm not convinced that this is a macrovascular issue however I want to make sure. We've been using silver alginate 06/03/17; the patient's x-rays that I ordered last time of his right ankle and right lateral [name] did not show definite osteomyelitis. We've been using silver alginate. The wounds are not making any progress. The area on the plantar left first metatarsal head however appears to be better. The patient complains of weakness that is more of the fatigue. He says if he's walking his head will fall onto his chest and that his legs literally gave out on him. He did see neurology in the past however that was at a time where his  workup was for temporal arteritis and headaches. 06/10/17; the patient is making no progress with the areas on the right lateral [name] and right lateral malleolus. In fact the area on the [name] probes to bone. X-ray did not show osteomyelitis. He went and saw vein and vascular on 06/04/17 he was felt to have significant reflux in the left greater saphenous vein over this is not in the area we are most concerned about. He could be offered ablation. He was not felt to have venous reflux noted in the right lower extremity. He was felt to have some degree of lymphedema and he was felt to be a candidate for compression pumps. Finally a diagnostic arteriogram was suggested which is really what I'm most interested in. The patient has wife wanted time to think about this, I think they were confused about interacting between venous and arterial discussions. I think it would be probably well worth going through the angiogram. Culture I did have the deeper area on the right lateral [name] showed a few Enterococcus faecalis. I would like to start her on amoxicillin which they will start today for one-week 500 3 times a day 06/17/17; the patient still has punched out areas on the right lateral [name] and right lateral malleolus. There is not a viable surface here. We have been using Santyl. He also has an area on the plantar aspect of his left first metatarsal head this also seems to be better 06/24/17; patient's angiogram as on 07/06/17; still has punched out areas on the right lateral [name] and right lateral malleolus to which we've been applying Santyl-based dressings. He also has a more superficial area on the left plantar first metatarsal head, using collagen here 07/08/17; the patient had his angiogram. This perineal artery had a 70-80% stenosis. Similarly the posterior tibial artery also was diseased. Furthermore down the posterior tibial artery in the distal  segment was a short stenosis of 80%. His major vessels  in his thigh and only minor irregularity. He had percutaneous angioplasty of the proximal right peroneal artery. Also angioplasty of the tibial peroneal trunk and proximal posterior tibial artery as well as the mid to distal segment of the posterior tibial artery. He handled this remarkably well. The patient arrived with a new wound on his right anterior calf which I think is the reopening of one of the original small open areas 07/15/17; the areas on his right anterior calf is new. He has a new threatened area on the right tibia more superiorly which is not open yet but makes me wonder whether this is going to reopen as well. His original wounds on the right lateral [name] and right lateral malleolus are somewhat better in terms of surface Ziegler, Jerick E. ([id number]) 07/22/17; the patient has a second open area on the right anterior. This was a threatened area superiorly last week. Each time this happens she has a small wound with a nephrotic cover. The areas on the right lateral [name] and right lateral malleolus are about the same. I did not attempt debridement in any of these today continuing with Santyl. oThe area on the left second metatarsal head looks better and he is healed laceration injuries on the arm from a fall last week with only the ventral forearm wound left 08/05/17 08/05/17; 2 week follow-up. This is a patient who initially presented with a multitude of small punched out areas on the right anterior calf greater than left dorsal [name]. This was in the setting of a constitutional illness at which time he saw multiple specialists was worked up for various rheumatologic diseases including temporal arteritis. Most of the areas on the right leg and a few on the left actually closed over however he he developed 2 small deep probing areas on the right lateral [name] and right lateral malleolus. He also has an area on the left plantar first metatarsal head which is gradually been getting  better. He was revascularized by Dr. dew about 4 weeks ago. He went to see Dr. Nehemiah Massed on 07/30/17; from review the note in time to the patient there wasn't an obvious cause. He did do a shave biopsy of the right anterior leg ulcer rule out cancer or other etiologies such as some embolic. This looks like some form of vasculopathy to me although trying to explain why it so much worse on the right leg than the left is been difficult. We will await the biopsy results. Dr. Nehemiah Massed wanted to put Bactroban and this not sure what that will do for the areas that have a completely nonviable surface. I'd like to go back to [place]. Electronic Signature(s) Signed: 08/05/2017 6:17:43 PM By: Linton Ham MD Entered By: Linton Ham on 08/05/2017 11:32:38 [place], [name] ([id number]) -------------------------------------------------------------------------------- Physical Exam Details Patient Name: [name] Date of Service: 08/05/2017 8:45 AM Medical Record Number: [id number] Patient Account Number: [id number] Date of Birth/Sex: June 14, 1940 (76 y.o. M) Treating RN: Cornell Barman Primary Care Provider: Cyndi Bender Other Clinician: Referring Provider: Cyndi Bender Treating Provider/Extender: Tito Dine in Treatment: 17 Constitutional Sitting or standing Blood Pressure is within target range for patient.. Pulse regular and within target range for patient.[name] Respirations regular, non-labored and within target range.. Temperature is normal and within the target range for the patient.[name] appears in no distress. Cardiovascular dorsalis pedis pulse is palpable which is an improvement since his revascularization. Notes Wound exam oThe  patient has 4 open wounds on the right leg; there is area on the right lateral [name] and right lateral malleolus both of which required debridement with a #3 curet. I am able to clean up the lateral malleolus quite well to what looks to be a healthy  base however not the area on the lateral [name]. He has a superficial wound on the right anterior tibial area which I think was the shave biopsy site and a small new area on the right lateral calf. oShe has the same small area on the left first metatarsal head Electronic Signature(s) Signed: 08/05/2017 6:17:43 PM By: Linton Ham MD Entered By: Linton Ham on 08/05/2017 11:34:23 [place], [name] ([id number]) -------------------------------------------------------------------------------- Physician Orders Details Patient Name: [name] Date of Service: 08/05/2017 8:45 AM Medical Record Number: [id number] Patient Account Number: [id number] Date of Birth/Sex: [birth year] (76 y.o. M) Treating RN: Cornell Barman Primary Care Provider: Cyndi Bender Other Clinician: Referring Provider: Cyndi Bender Treating Provider/Extender: Tito Dine in Treatment: 18 Verbal / Phone Orders: No Diagnosis Coding Wound Cleansing Wound #15 Right,Proximal,Anterior Lower Leg o Clean wound with Normal Saline. Wound #16 Right,Lateral Lower Leg o Clean wound with Normal Saline. Wound #6 Right,Lateral Malleolus o Clean wound with Normal Saline. Wound #8 Right,Lateral [name] o Clean wound with Normal Saline. Wound #9 Left Metatarsal head first o Clean wound with Normal Saline. Anesthetic (add to Medication List) Wound #15 Right,Proximal,Anterior Lower Leg o Topical Lidocaine 4% cream applied to wound bed prior to debridement (In Clinic Only). Wound #16 Right,Lateral Lower Leg o Topical Lidocaine 4% cream applied to wound bed prior to debridement (In Clinic Only). Wound #6 Right,Lateral Malleolus o Topical Lidocaine 4% cream applied to wound bed prior to debridement (In Clinic Only). Wound #8 Right,Lateral [name] o Topical Lidocaine 4% cream applied to wound bed prior to debridement (In Clinic Only). Wound #9 Left Metatarsal head first o Topical Lidocaine 4% cream  applied to wound bed prior to debridement (In Clinic Only). Primary Wound Dressing Wound #15 Right,Proximal,Anterior Lower Leg o Silver Collagen Wound #9 Left Metatarsal head first o Silver Collagen Wound #16 Right,Lateral Lower Leg o Santyl Ointment Wound #6 Right,Lateral Malleolus o Santyl Ointment GEOVANIE, WINNETT ([id number]) Wound #8 Right,Lateral [name] o Santyl Ointment Secondary Dressing Wound #15 Right,Proximal,Anterior Lower Leg o ABD pad Wound #16 Right,Lateral Lower Leg o ABD pad Wound #6 Right,Lateral Malleolus o ABD pad Wound #8 Right,Lateral [name] o ABD pad Wound #9 Left Metatarsal head first o ABD pad o Boardered Foam Dressing Dressing Change Frequency Wound #15 Right,Proximal,Anterior Lower Leg o Change Dressing Monday, Wednesday, Friday Wound #16 Right,Lateral Lower Leg o Change Dressing Monday, Wednesday, Friday Wound #6 Right,Lateral Malleolus o Change Dressing Monday, Wednesday, Friday Wound #8 Right,Lateral [name] o Change Dressing Monday, Wednesday, Friday Wound #9 Left Metatarsal head first o Change Dressing Monday, Wednesday, Friday Follow-up Appointments Wound #15 Right,Proximal,Anterior Lower Leg o Return Appointment in 2 weeks. Wound #16 Right,Lateral Lower Leg o Return Appointment in 2 weeks. Wound #6 Right,Lateral Malleolus o Return Appointment in 2 weeks. Wound #8 Right,Lateral [name] o Return Appointment in 2 weeks. Wound #9 Left Metatarsal head first o Return Appointment in 2 weeks. Edema Control Wound #15 Right,Proximal,Anterior Lower Leg o Kerlix and Coban - Right Lower Extremity Ausmus, Keaton E. ([id number]) o Elevate legs to the level of the heart and pump ankles as often as possible Wound #16 Right,Lateral Lower Leg o Kerlix and Coban - Right Lower Extremity o Elevate legs to the level  of the heart and pump ankles as often as possible Wound #6 Right,Lateral Malleolus o Kerlix  and Coban - Right Lower Extremity o Elevate legs to the level of the heart and pump ankles as often as possible Wound #8 Right,Lateral [name] o Kerlix and Coban - Right Lower Extremity o Elevate legs to the level of the heart and pump ankles as often as possible Home Health Wound #15 Right,Proximal,Anterior Lower Leg o [place] Nurse may visit PRN to address patientos wound care needs. o FACE TO FACE ENCOUNTER: MEDICARE and MEDICAID PATIENTS: I certify that this patient is under my care and that I had a face-to-face encounter that meets the physician face-to-face encounter requirements with this patient on this date. The encounter with the patient was in whole or in part for the following MEDICAL CONDITION: (primary reason for [place]) MEDICAL NECESSITY: I certify, that based on my findings, NURSING services are a medically necessary home health service. HOME BOUND STATUS: I certify that my clinical findings support that this patient is homebound (i.e., Due to illness or injury, pt requires aid of supportive devices such as crutches, cane, wheelchairs, walkers, the use of special transportation or the assistance of another person to leave their place of residence. There is a normal inability to leave the home and doing so requires considerable and taxing effort. Other absences are for medical reasons / religious services and are infrequent or of short duration when for other reasons). o If current dressing causes regression in wound condition, may D/C ordered dressing product/s and apply Normal Saline Moist Dressing daily until next [place] / Other MD appointment. [place] of regression in wound condition at [phone]. o Please direct any NON-WOUND related issues/requests for orders to patient's Primary Care Physician Wound #16 [place]  Nurse may visit PRN to address patientos wound care needs. o FACE TO FACE ENCOUNTER: MEDICARE and MEDICAID PATIENTS: I certify that this patient is under my care and that I had a face-to-face encounter that meets the physician face-to-face encounter requirements with this patient on this date. The encounter with the patient was in whole or in part for the following MEDICAL CONDITION: (primary reason for [place]) MEDICAL NECESSITY: I certify, that based on my findings, NURSING services are a medically necessary home health service. HOME BOUND STATUS: I certify that my clinical findings support that this patient is homebound (i.e., Due to illness or injury, pt requires aid of supportive devices such as crutches, cane, wheelchairs, walkers, the use of special transportation or the assistance of another person to leave their place of residence. There is a normal inability to leave the home and doing so requires considerable and taxing effort. Other absences are for medical reasons / religious services and are infrequent or of short duration when for other reasons). o If current dressing causes regression in wound condition, may D/C ordered dressing product/s and apply Normal Saline Moist Dressing daily until next [place] / Other MD appointment. [place] of regression in wound condition at [phone]. o Please direct any NON-WOUND related issues/requests for orders to patient's Primary Care Physician Wound #6 [place] Nurse may visit PRN to address patientos wound care needs. o FACE TO FACE ENCOUNTER: MEDICARE and MEDICAID PATIENTS: I certify that this patient is under my care and that I had a face-to-face  encounter that meets the physician face-to-face encounter requirements with this patient on this date. The encounter with the patient was in whole or in part for the following  MEDICAL CONDITION: (primary reason for [place]) MEDICAL NECESSITY: I certify, that based on my findings, TEEJAY, MEADER ([id number]) NURSING services are a medically necessary home health service. HOME BOUND STATUS: I certify that my clinical findings support that this patient is homebound (i.e., Due to illness or injury, pt requires aid of supportive devices such as crutches, cane, wheelchairs, walkers, the use of special transportation or the assistance of another person to leave their place of residence. There is a normal inability to leave the home and doing so requires considerable and taxing effort. Other absences are for medical reasons / religious services and are infrequent or of short duration when for other reasons). o If current dressing causes regression in wound condition, may D/C ordered dressing product/s and apply Normal Saline Moist Dressing daily until next [place] / Other MD appointment. [place] of regression in wound condition at [phone]. o Please direct any NON-WOUND related issues/requests for orders to patient's Primary Care Physician Wound #8 [place] Nurse may visit PRN to address patientos wound care needs. o FACE TO FACE ENCOUNTER: MEDICARE and MEDICAID PATIENTS: I certify that this patient is under my care and that I had a face-to-face encounter that meets the physician face-to-face encounter requirements with this patient on this date. The encounter with the patient was in whole or in part for the following MEDICAL CONDITION: (primary reason for [place]) MEDICAL NECESSITY: I certify, that based on my findings, NURSING services are a medically necessary home health service. HOME BOUND STATUS: I certify that my clinical findings support that this patient is homebound (i.e., Due to illness or injury, pt requires aid of supportive devices such as  crutches, cane, wheelchairs, walkers, the use of special transportation or the assistance of another person to leave their place of residence. There is a normal inability to leave the home and doing so requires considerable and taxing effort. Other absences are for medical reasons / religious services and are infrequent or of short duration when for other reasons). o If current dressing causes regression in wound condition, may D/C ordered dressing product/s and apply Normal Saline Moist Dressing daily until next [place] / Other MD appointment. [place] of regression in wound condition at [phone]. o Please direct any NON-WOUND related issues/requests for orders to patient's Primary Care Physician Wound #9 Left Metatarsal head first o [place] Nurse may visit PRN to address patientos wound care needs. o FACE TO FACE ENCOUNTER: MEDICARE and MEDICAID PATIENTS: I certify that this patient is under my care and that I had a face-to-face encounter that meets the physician face-to-face encounter requirements with this patient on this date. The encounter with the patient was in whole or in part for the following MEDICAL CONDITION: (primary reason for [place]) MEDICAL NECESSITY: I certify, that based on my findings, NURSING services are a medically necessary home health service. HOME BOUND STATUS: I certify that my clinical findings support that this patient is homebound (i.e., Due to illness or injury, pt requires aid of supportive devices such as crutches, cane, wheelchairs, walkers, the use of special transportation or the assistance of another person to leave their place of residence. There is a normal inability to leave  the home and doing so requires considerable and taxing effort. Other absences are for medical reasons / religious services and are infrequent or of short duration when for other reasons). o If  current dressing causes regression in wound condition, may D/C ordered dressing product/s and apply Normal Saline Moist Dressing daily until next [place] / Other MD appointment. [place] of regression in wound condition at [phone]. o Please direct any NON-WOUND related issues/requests for orders to patient's Primary Care Physician Electronic Signature(s) Signed: 08/05/2017 6:17:43 PM By: Linton Ham MD Signed: 08/07/2017 6:17:48 PM By: Gretta Cool, BSN, RN, CWS, Kim RN, BSN Entered By: Gretta Cool, BSN, RN, CWS, Kim on 08/05/2017 09:55:24 Durfey, [name] ([id number]) -------------------------------------------------------------------------------- Problem List Details Patient Name: [name] Date of Service: 08/05/2017 8:45 AM Medical Record Number: [id number] Patient Account Number: [id number] Date of Birth/Sex: Aug 10, 1940 (76 y.o. 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 limited to 04/08/2017 No Yes breakdown of skin I25.5 Ischemic cardiomyopathy 04/08/2017 No Yes 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 of 07/08/2017 No Yes other part of [name] S41.111D Laceration without foreign body of right upper arm, 07/15/2017 No Yes subsequent encounter Inactive Problems Resolved Problems Electronic Signature(s) Signed: 08/05/2017 6:17:43 PM By: Linton Ham MD Entered By: Linton Ham on 08/05/2017 10:48:03 [place], [name]  ([id number]) -------------------------------------------------------------------------------- Progress Note Details Patient Name: [name] Date of Service: 08/05/2017 8:45 AM Medical Record Number: [id number] Patient Account Number: [id number] Date of Birth/Sex: Jul 01, 1940 (76 y.o. M) Treating RN: Cornell Barman Primary Care Provider: Cyndi Bender Other Clinician: Referring Provider: Cyndi Bender Treating Provider/Extender: Tito Dine in Treatment: 17 Subjective History of Present Illness (HPI) 04/08/17; this is a complex 77 year old man referred here from [facility]. He had been referred there for bilateral lower extremity edema with ulcer formation predominantly on the right calf but also the right [name]. He had been receiving Unna boots bilaterally. The history here is long. He is not a diabetic however ICU looking through late 2018 he was worked up for chronic headaches, elevated inflammatory markers including C-reactive protein and ESR. He went on to actually have a left temporal artery biopsy wasn't that was negative.he received about 6 weeks of high-dose prednisone 60 mg with improvement in his inflammatory markers. He was admitted to hospital in late November with ventricular tachycardia syncope. He has known ischemic cardiomyopathy. He was admitted in the hospital in mid December. Apparently this was precipitated by a syncopal spell falling out of his scooter while at [place]. There was ventricular arrhythmia. He has an implantable defibrillator and echocardiogram showed severe LV dysfunction with an EF of 20% and valvular regurgitations including mild AR, moderate MR. There was no stenosis. He ruled in for a non-ST elevation MI in the setting of V. tach.his wife states that sometime during this hospitalization she noted multiple areas of skin change on the right lower calf which became evident just after he left the hospital. He was back in  hospital in February with acute renal failure hyponatremia. This responded to fluid  resuscitation.. Interestingly I can't see much description of his right leg at that point in time.he was followed by Dr. Nehemiah Massed of dermatology for the necrotic wounds on his right leg. Apparently a biopsy was planned at one point but not done although in some notes that suggests it was. I cannot see these results area He was noted to have a lot of edema. Was treated with bilateral Unna boots edges really helped with the swelling they have been using Bactroban to small open areas predominantly on the right anterior lower leg His history is complicated by the fact that he has rheumatoid arthritis followed by rheumatology. He is followed by neurology for disabling headaches. At one point this was felt to be giant cell arteritis although a left temporal biopsy was apparently negative. He was given a prolonged course of prednisone at 60 mg which managed his sedimentation rates but apparently did not prove improve the headaches. This is been tapered to off on by rheumatology on 03/13/17 Vascular had plans to do a venous reflux workup as well as arterial studies in May. They also wanted to get him a lymphedema pump. As mentioned he's been using bacitracin under Unna boot wraps to both lower legs 04/15/17; the patient arrives with most of his wounds improved. These are small punched out wounds. Most of them remaining ones are on the right anterior calf with the most problematic over the right lateral malleolus. There are no new areas. The symptom complex or potential symptom complex we are dealing with his chronic disabling headaches with inflammatory markers not responsive to prednisone and with a negative temporal biopsy, lower extremity weakness, skin ulcerations just on the right leg. We have managed to get his arterial studies moved up to April 30. He has a rheumatology consult at [facility] in June. He has seen dermatology  locally, rheumatology locally, neurology locally. 04/22/17; small punched out areas on the right leg anteriorly posteriorly. Most of these appear to have closed over. Some of them have eschar over the surface. The most problematic area appears to be over the right lateral malleolus. We've been using prisma to all of this. He has arterial studies on April 30 and a rheumatology consult at [facility] on June 28 04/29/17; most of the small punched out areas on the right leg posteriorly are closed. He has 2 or 3 openings anteriorly but most of these appear to be on the way to closing. Still problematically over the right lateral malleolus and right lateral [name] with almost ischemic-looking eschar. His arterial studies that I ordered are due to be done next week on the 30th so we should have them available for our next visit hopefully. He also saw a rheumatologist at [facility] and according to the patient he did 8 vials of blood. Finally he has scaling rash on his left [name] and what looks to be at [facility] area on the left anterior leg 05/06/17; most of the small punched out areas on the right leg posteriorly and anteriorly are closed. He still has one small one Knotts, Morgon E. ([id number]) anteriorly one over the right lateral malleolus and one over the right lateral [name]. He had his arterial studies they didn't seem to do waveform analysis not exactly sure why however in any case is ABIs were noncompressible bilaterally. They did provide TBIs although looking at the pressures it appears that his TBIs are quite normal. I therefore went ahead and debrided the area over the right lateral malleolus and the right lateral [name]  05/13/17; most of the small punched out areas on the right leg posteriorly and anteriorly are closed. He continues to have problematic areas over the right lateral malleolus and the right lateral [name]. He still requiring aggressive debridement of these 2 wounds using silver collagen I reviewed the  note from rheumatology I don't think they came up with a specific diagnosis although he is known to have seropositive RA. They did a panel of lab work when I was able to see his his AMA was negative, anti-smooth muscle antibodies negative antineutrophil cytoplasmic antibodies negative,Liv/kia type 1 negative. Serum C3 and C4 were negative. I don't see his muscle enzymes specifically. He was referred back to his local rheumatologist for management of his known rheumatoid arthritis.the patient states he still feels weak and fatigued. He states he has numbness in both feet and apparently is known to have neuropathy 05/20/17; the patient continues to have a difficult problem on the right lateral malleolus and the right lateral [name]. Both of these wounds have no viable surface even with attempts at debridement. He has a new wound on the left plantar metatarsal head which looks more like a superficial diabetic pressure related injury then part of this underlying issue he has. Most of the rest of the wounds on his legs look satisfactory. Mostly on the right calf.reviewed his arterial studies which showed noncompressible vessels bilaterally but the TBIs were quite normal. 05/27/17; no real improvement in the right lateral malleolus and right lateral [name]. In fact the right lateral [name] is now on bone. I gave him doxycycline empirically last week it appears that he developed photosensitivity was 11 the son in a tractor. I'll not give him any more of this. This is predominantly on his face and dorsal forearms and hands. I given this more as an anti- inflammatory. I'm going to have him seen by vascular surgery. His TBIs that he had done previously ordered by Dr. Brigitte Pulse were in the normal range They never did full arterial studies on him. he now has small punched out wounds on the right lateral ankle and right lateral [name]. I doubt these are ischemic however I would like a review of his macrovascular status. He  does describe some pain at night. I'll reduce the compression from 3 layer to 2 layer and I'm not convinced that this is a macrovascular issue however I want to make sure. We've been using silver alginate 06/03/17; the patient's x-rays that I ordered last time of his right ankle and right lateral [name] did not show definite osteomyelitis. We've been using silver alginate. The wounds are not making any progress. The area on the plantar left first metatarsal head however appears to be better. The patient complains of weakness that is more of the fatigue. He says if he's walking his head will fall onto his chest and that his legs literally gave out on him. He did see neurology in the past however that was at a time where his workup was for temporal arteritis and headaches. 06/10/17; the patient is making no progress with the areas on the right lateral [name] and right lateral malleolus. In fact the area on the [name] probes to bone. X-ray did not show osteomyelitis. He went and saw vein and vascular on 06/04/17 he was felt to have significant reflux in the left greater saphenous vein over this is not in the area we are most concerned about. He could be offered ablation. He was not felt to have venous reflux noted in  the right lower extremity. He was felt to have some degree of lymphedema and he was felt to be a candidate for compression pumps. Finally a diagnostic arteriogram was suggested which is really what I'm most interested in. The patient has wife wanted time to think about this, I think they were confused about interacting between venous and arterial discussions. I think it would be probably well worth going through the angiogram. Culture I did have the deeper area on the right lateral [name] showed a few Enterococcus faecalis. I would like to start her on amoxicillin which they will start today for one-week 500 3 times a day 06/17/17; the patient still has punched out areas on the right lateral [name] and  right lateral malleolus. There is not a viable surface here. We have been using Santyl. He also has an area on the plantar aspect of his left first metatarsal head this also seems to be better 06/24/17; patient's angiogram as on 07/06/17; still has punched out areas on the right lateral [name] and right lateral malleolus to which we've been applying Santyl-based dressings. He also has a more superficial area on the left plantar first metatarsal head, using collagen here 07/08/17; the patient had his angiogram. This perineal artery had a 70-80% stenosis. Similarly the posterior tibial artery also was diseased. Furthermore down the posterior tibial artery in the distal segment was a short stenosis of 80%. His major vessels in his thigh and only minor irregularity. He had percutaneous angioplasty of the proximal right peroneal artery. Also angioplasty of the tibial peroneal trunk and proximal posterior tibial artery as well as the mid to distal segment of the posterior tibial artery. He handled this remarkably well. The patient arrived with a new wound on his right anterior calf which I think is the reopening of one of the original small open areas 07/15/17; the areas on his right anterior calf is new. He has a new threatened area on the right tibia more superiorly which is not open yet but makes me wonder whether this is going to reopen as well. His original wounds on the right lateral [name] and Loll, Rylon E. ([id number]) right lateral malleolus are somewhat better in terms of surface 07/22/17; the patient has a second open area on the right anterior. This was a threatened area superiorly last week. Each time this happens she has a small wound with a nephrotic cover. The areas on the right lateral [name] and right lateral malleolus are about the same. I did not attempt debridement in any of these today continuing with Santyl. The area on the left second metatarsal head looks better and he is healed  laceration injuries on the arm from a fall last week with only the ventral forearm wound left 08/05/17 08/05/17; 2 week follow-up. This is a patient who initially presented with a multitude of small punched out areas on the right anterior calf greater than left dorsal [name]. This was in the setting of a constitutional illness at which time he saw multiple specialists was worked up for various rheumatologic diseases including temporal arteritis. Most of the areas on the right leg and a few on the left actually closed over however he he developed 2 small deep probing areas on the right lateral [name] and right lateral malleolus. He also has an area on the left plantar first metatarsal head which is gradually been getting better. He was revascularized by Dr. dew about 4 weeks ago. He went to see Dr. Nehemiah Massed on 07/30/17; from  review the note in time to the patient there wasn't an obvious cause. He did do a shave biopsy of the right anterior leg ulcer rule out cancer or other etiologies such as some embolic. This looks like some form of vasculopathy to me although trying to explain why it so much worse on the right leg than the left is been difficult. We will await the biopsy results. Dr. Nehemiah Massed wanted to put Bactroban and this not sure what that will do for the areas that have a completely nonviable surface. I'd like to go back to [place]. Objective Constitutional Sitting or standing Blood Pressure is within target range for patient.. Pulse regular and within target range for patient.[name] Respirations regular, non-labored and within target range.. Temperature is normal and within the target range for the patient.[name] appears in no distress. Vitals Time Taken: 9:05 AM, Height: 68 in, Temperature: 97.7 F, Pulse: 67 bpm, Respiratory Rate: 16 breaths/min, Blood Pressure: 97/49 mmHg. General Notes: Took BP manually 90/58. Made Dr. Dellia Nims aware of BP. Cardiovascular dorsalis pedis pulse is palpable which is an  improvement since his revascularization. General Notes: Wound exam The patient has 4 open wounds on the right leg; there is area on the right lateral [name] and right lateral malleolus both of which required debridement with a #3 curet. I am able to clean up the lateral malleolus quite well to what looks to be a healthy base however not the area on the lateral [name]. He has a superficial wound on the right anterior tibial area which I think was the shave biopsy site and a small new area on the right lateral calf. She has the same small area on the left first metatarsal head Integumentary (Hair, Skin) Wound #10 status is Healed - Epithelialized. Original cause of wound was Gradually Appeared. The wound is located on the Right,Distal,Anterior Lower Leg. The wound measures 0cm length x 0cm width x 0cm depth; 0cm^2 area and 0cm^3 volume. Wound #11 status is Healed - Epithelialized. Original cause of wound was Not Known. The wound is located on the NOEH, SPARACINO ([id number]) Right,Medial,Posterior Lower Leg. The wound measures 0cm length x 0cm width x 0cm depth; 0cm^2 area and 0cm^3 volume. Wound #13 status is Healed - Epithelialized. Original cause of wound was Trauma. The wound is located on the Right Forearm. The wound measures 0cm length x 0cm width x 0cm depth; 0cm^2 area and 0cm^3 volume. Wound #15 status is Open. Original cause of wound was Gradually Appeared. The wound is located on the Right,Proximal,Anterior Lower Leg. The wound measures 1cm length x 0.7cm width x 0.1cm depth; 0.55cm^2 area and 0.055cm^3 volume. There is no tunneling or undermining noted. There is a large amount of serosanguineous drainage noted. The wound margin is distinct with the outline attached to the wound base. There is no granulation within the wound bed. There is a large (67-100%) amount of necrotic tissue within the wound bed including Eschar and Adherent Slough. The periwound skin appearance exhibited: Erythema.  The surrounding wound skin color is noted with erythema which is circumferential. Periwound temperature was noted as No Abnormality. The periwound has tenderness on palpation. Wound #16 status is Open. Original cause of wound was Gradually Appeared. The wound is located on the Right,Lateral Lower Leg. The wound measures 0.8cm length x 0.3cm width x 0.1cm depth; 0.188cm^2 area and 0.019cm^3 volume. There is no tunneling or undermining noted. There is a large amount of serosanguineous drainage noted. The wound margin is distinct with the outline  attached to the wound base. There is medium (34-66%) red granulation within the wound bed. There is a medium (34-66%) amount of necrotic tissue within the wound bed including Eschar and Adherent Slough. The periwound skin appearance did not exhibit: Maceration. Periwound temperature was noted as No Abnormality. The periwound has tenderness on palpation. Wound #6 status is Open. Original cause of wound was Gradually Appeared. The wound is located on the Right,Lateral Malleolus. The wound measures 0.4cm length x 0.4cm width x 0.3cm depth; 0.126cm^2 area and 0.038cm^3 volume. There is no tunneling or undermining noted. There is a large amount of serous drainage noted. The wound margin is distinct with the outline attached to the wound base. There is no granulation within the wound bed. There is a large (67-100%) amount of necrotic tissue within the wound bed including Adherent Slough. The periwound skin appearance exhibited: Callus, Erythema. The periwound skin appearance did not exhibit: Crepitus, Excoriation, Induration, Rash, Scarring, Dry/Scaly, Maceration, Atrophie Blanche, Cyanosis, Ecchymosis, Hemosiderin Staining, Mottled, Pallor, Rubor. The surrounding wound skin color is noted with erythema which is circumferential. Periwound temperature was noted as No Abnormality. The periwound has tenderness on palpation. Wound #8 status is Open. Original cause of  wound was Gradually Appeared. The wound is located on the Right,Lateral [name]. The wound measures 0.8cm length x 0.8cm width x 0.4cm depth; 0.503cm^2 area and 0.201cm^3 volume. There is bone and Fat Layer (Subcutaneous Tissue) Exposed exposed. There is no tunneling or undermining noted. There is a large amount of serous drainage noted. The wound margin is distinct with the outline attached to the wound base. There is no granulation within the wound bed. There is a large (67-100%) amount of necrotic tissue within the wound bed including Adherent Slough. The periwound skin appearance exhibited: Callus, Maceration, Erythema. The surrounding wound skin color is noted with erythema which is circumferential. Periwound temperature was noted as No Abnormality. The periwound has tenderness on palpation. Wound #9 status is Open. Original cause of wound was Gradually Appeared. The wound is located on the Left Metatarsal head first. The wound measures 0.3cm length x 0.2cm width x 0.1cm depth; 0.047cm^2 area and 0.005cm^3 volume. There is no tunneling or undermining noted. There is a small amount of serous drainage noted. The wound margin is flat and intact. There is no granulation within the wound bed. There is a large (67-100%) amount of necrotic tissue within the wound bed including Adherent Slough. The periwound skin appearance exhibited: Callus, Maceration. The periwound skin appearance did not exhibit: Crepitus, Excoriation, Induration, Rash, Scarring, Dry/Scaly, Atrophie Blanche, Cyanosis, Ecchymosis, Hemosiderin Staining, Mottled, Pallor, Rubor, Erythema. Periwound temperature was noted as No Abnormality. 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 There was a Excisional Skin/Subcutaneous Tissue Debridement with a total area of 0.7 sq cm performed by Ricard Dillon, MD. With the following instrument(s): Curette to remove Non-Viable tissue/material. Material removed includes Eschar and Subcutaneous Tissue and after achieving pain control using Other (lidocaine  4%). No specimens were taken. A time out was conducted at 09:49, prior to the start of the procedure. A Minimum amount of bleeding was controlled with Pressure. The procedure was tolerated well. Patient s Level of Consciousness post procedure was recorded as Awake and Alert. Post Debridement Measurements: 1cm length x 0.7cm width x 0.2cm depth; 0.11cm^3 volume. Character of Wound/Ulcer Post Debridement requires further debridement. Severity of Tissue Post Debridement is: Fat layer exposed. 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 There was a Excisional Skin/Subcutaneous Tissue Debridement with a total area of 0.16 sq cm performed by Ricard Dillon, MD. With the following instrument(s): Curette to remove Non-Viable tissue/material. Material removed includes Subcutaneous Tissue and Slough and after achieving pain control using Other (lidocaine 4%). No specimens were taken. A time out was conducted at 09:49, prior to the start of the procedure. A Minimum amount of bleeding was controlled with Pressure. The procedure was  tolerated well. Patient s Level of Consciousness post procedure was recorded as Awake and Alert. Post Debridement Measurements: 0.4cm length x 0.4cm width x 0.3cm depth; 0.038cm^3 volume. Character of Wound/Ulcer Post Debridement requires further debridement. Severity of Tissue Post Debridement is: Fat layer exposed. Post procedure Diagnosis Wound #6: Same as Pre-Procedure Wound #8 Pre-procedure diagnosis of Wound #8 is an Arterial Insufficiency Ulcer located on the Right,Lateral [name] .Severity of Tissue Pre Debridement is: Fat layer exposed. There was a Excisional Skin/Subcutaneous Tissue Debridement with a total area of 0.64 sq cm performed by Ricard Dillon, MD. With the following instrument(s): Curette to remove Non-Viable tissue/material. Material removed includes Subcutaneous Tissue and Slough and after achieving pain control using Other (lidocaine 4%). No specimens were taken. A time out was conducted at 09:49, prior to the start of the procedure. A Minimum amount of bleeding was controlled with Pressure. The procedure was tolerated well. Patient s Level of Consciousness post procedure was recorded as Awake and Alert. Post Debridement Measurements: 0.8cm length x 0.8cm width x 0.4cm depth; 0.201cm^3 volume. Character of Wound/Ulcer Post Debridement requires further debridement. Severity of Tissue Post Debridement is: Fat layer exposed. Post procedure Diagnosis Wound #8: Same as Pre-Procedure Pettie, SHAWNTEZ DICKISON. ([id number]) Plan Wound Cleansing: Wound #15 Right,Proximal,Anterior Lower Leg: Clean wound with Normal Saline. Wound #16 Right,Lateral Lower Leg: Clean wound with Normal Saline. Wound #6 Right,Lateral Malleolus: Clean wound with Normal Saline. Wound #8 Right,Lateral [name]: Clean wound with Normal Saline. Wound #9 Left Metatarsal head first: Clean wound with Normal Saline. Anesthetic (add to Medication List): Wound #15 Right,Proximal,Anterior Lower Leg: Topical  Lidocaine 4% cream applied to wound bed prior to debridement (In Clinic Only). Wound #16 Right,Lateral Lower Leg: Topical Lidocaine 4% cream applied to wound bed prior to debridement (In Clinic Only). Wound #6 Right,Lateral Malleolus: Topical Lidocaine 4% cream applied to wound bed prior to debridement (In Clinic Only). Wound #8 Right,Lateral [name]: Topical Lidocaine 4% cream applied to wound bed prior to debridement (In Clinic Only). Wound #9 Left Metatarsal head first: Topical Lidocaine 4% cream applied to wound bed prior to debridement (In Clinic Only). Primary Wound Dressing: Wound #15 Right,Proximal,Anterior Lower Leg: Silver Collagen Wound #9 Left Metatarsal head first: Silver Collagen Wound #16 Right,Lateral Lower Leg: Santyl Ointment Wound #6 Right,Lateral Malleolus: Santyl Ointment Wound #8 Right,Lateral [name]: Santyl Ointment Secondary Dressing: Wound #15 Right,Proximal,Anterior Lower Leg: ABD pad Wound #16 Right,Lateral  Lower Leg: ABD pad Wound #6 Right,Lateral Malleolus: ABD pad Wound #8 Right,Lateral [name]: ABD pad Wound #9 Left Metatarsal head first: ABD pad Boardered Foam Dressing Dressing Change Frequency: Wound #15 Right,Proximal,Anterior Lower Leg: Change Dressing Monday, Wednesday, Friday Wound #16 Right,Lateral Lower Leg: Change Dressing Monday, Wednesday, Friday Wound #6 Right,Lateral Malleolus: Change Dressing Monday, Wednesday, Friday Wound #8 Right,Lateral [name]: Change Dressing Monday, Wednesday, Friday Wound #9 Left Metatarsal head first: Change Dressing Monday, Wednesday, Friday LANG, ZINGG ([id number]) Follow-up Appointments: Wound #15 Right,Proximal,Anterior Lower Leg: Return Appointment in 2 weeks. Wound #16 Right,Lateral Lower Leg: Return Appointment in 2 weeks. Wound #6 Right,Lateral Malleolus: Return Appointment in 2 weeks. Wound #8 Right,Lateral [name]: Return Appointment in 2 weeks. Wound #9 Left Metatarsal head first: Return  Appointment in 2 weeks. Edema Control: Wound #15 Right,Proximal,Anterior Lower Leg: Kerlix and Coban - Right Lower Extremity Elevate legs to the level of the heart and pump ankles as often as possible Wound #16 Right,Lateral Lower Leg: Kerlix and Coban - Right Lower Extremity Elevate legs to the level of the heart and pump ankles as often as possible Wound #6 Right,Lateral Malleolus: Kerlix and Coban - Right Lower Extremity Elevate legs to the level of the heart and pump ankles as often as possible Wound #8 Right,Lateral [name]: Kerlix and Coban - Right Lower Extremity Elevate legs to the level of the heart and pump ankles as often as possible Home Health: Wound #15 Right,Proximal,Anterior Lower Leg: [place] Nurse may visit PRN to address patient s wound care needs. FACE TO FACE ENCOUNTER: MEDICARE and MEDICAID PATIENTS: I certify that this patient is under my care and that I had a face-to-face encounter that meets the physician face-to-face encounter requirements with this patient on this date. The encounter with the patient was in whole or in part for the following MEDICAL CONDITION: (primary reason for [place]) MEDICAL NECESSITY: I certify, that based on my findings, NURSING services are a medically necessary home health service. HOME BOUND STATUS: I certify that my clinical findings support that this patient is homebound (i.e., Due to illness or injury, pt requires aid of supportive devices such as crutches, cane, wheelchairs, walkers, the use of special transportation or the assistance of another person to leave their place of residence. There is a normal inability to leave the home and doing so requires considerable and taxing effort. Other absences are for medical reasons / religious services and are infrequent or of short duration when for other reasons). If current dressing causes regression in wound condition, may D/C ordered dressing  product/s and apply Normal Saline Moist Dressing daily until next [place] / Other MD appointment. [place] of regression in wound condition at [phone]. Please direct any NON-WOUND related issues/requests for orders to patient's Primary Care Physician Wound #16 Right,Lateral Lower Leg: [name] Nurse may visit PRN to address patient s wound care needs. FACE TO FACE ENCOUNTER: MEDICARE and MEDICAID PATIENTS: I certify that this patient is under my care and that I had a face-to-face encounter that meets the physician face-to-face encounter requirements with this patient on this date. The encounter with the patient was in whole or in part for the following MEDICAL CONDITION: (primary reason for [place]) MEDICAL NECESSITY: I certify, that based on my findings, NURSING services are a medically necessary home health service. HOME BOUND STATUS: I certify that my clinical findings support that this patient is homebound (i.e., Due to  illness or injury, pt requires aid of supportive devices such as crutches, cane, wheelchairs, walkers, the use of special transportation or the assistance of another person to leave their place of residence. There is a normal inability to leave the home and doing so requires considerable and taxing effort. Other absences are for medical reasons / religious services and are infrequent or of short duration when for other reasons). If current dressing causes regression in wound condition, may D/C ordered dressing product/s and apply Normal Saline Moist Dressing daily until next [place] / Other MD appointment. [place] of regression in wound condition at [phone]. Please direct any NON-WOUND related issues/requests for orders to patient's Primary Care Physician Wound #6 Right,Lateral Malleolus: [place] Nurse may visit PRN to address  patient s wound care needs. SHAMAL, STRACENER ([id number]) FACE TO FACE ENCOUNTER: MEDICARE and MEDICAID PATIENTS: I certify that this patient is under my care and that I had a face-to-face encounter that meets the physician face-to-face encounter requirements with this patient on this date. The encounter with the patient was in whole or in part for the following MEDICAL CONDITION: (primary reason for [place]) MEDICAL NECESSITY: I certify, that based on my findings, NURSING services are a medically necessary home health service. HOME BOUND STATUS: I certify that my clinical findings support that this patient is homebound (i.e., Due to illness or injury, pt requires aid of supportive devices such as crutches, cane, wheelchairs, walkers, the use of special transportation or the assistance of another person to leave their place of residence. There is a normal inability to leave the home and doing so requires considerable and taxing effort. Other absences are for medical reasons / religious services and are infrequent or of short duration when for other reasons). If current dressing causes regression in wound condition, may D/C ordered dressing product/s and apply Normal Saline Moist Dressing daily until next [place] / Other MD appointment. [place] of regression in wound condition at [phone]. Please direct any NON-WOUND related issues/requests for orders to patient's Primary Care Physician Wound #8 Right,Lateral [name]: [place] Nurse may visit PRN to address patient s wound care needs. FACE TO FACE ENCOUNTER: MEDICARE and MEDICAID PATIENTS: I certify that this patient is under my care and that I had a face-to-face encounter that meets the physician face-to-face encounter requirements with this patient on this date. The encounter with the patient was in whole or in part for the following MEDICAL CONDITION: (primary reason for  [place]) MEDICAL NECESSITY: I certify, that based on my findings, NURSING services are a medically necessary home health service. HOME BOUND STATUS: I certify that my clinical findings support that this patient is homebound (i.e., Due to illness or injury, pt requires aid of supportive devices such as crutches, cane, wheelchairs, walkers, the use of special transportation or the assistance of another person to leave their place of residence. There is a normal inability to leave the home and doing so requires considerable and taxing effort. Other absences are for medical reasons / religious services and are infrequent or of short duration when for other reasons). If current dressing causes regression in wound condition, may D/C ordered dressing product/s and apply Normal Saline Moist Dressing daily until next [place] / Other MD appointment. [place] of regression in wound condition at [phone]. Please direct any NON-WOUND related issues/requests for orders to patient's Primary  Care Physician Wound #9 Left Metatarsal head first: [place] Nurse may visit PRN to address patient s wound care needs. FACE TO FACE ENCOUNTER: MEDICARE and MEDICAID PATIENTS: I certify that this patient is under my care and that I had a face-to-face encounter that meets the physician face-to-face encounter requirements with this patient on this date. The encounter with the patient was in whole or in part for the following MEDICAL CONDITION: (primary reason for [place]) MEDICAL NECESSITY: I certify, that based on my findings, NURSING services are a medically necessary home health service. HOME BOUND STATUS: I certify that my clinical findings support that this patient is homebound (i.e., Due to illness or injury, pt requires aid of supportive devices such as crutches, cane, wheelchairs, walkers, the use of special transportation or the  assistance of another person to leave their place of residence. There is a normal inability to leave the home and doing so requires considerable and taxing effort. Other absences are for medical reasons / religious services and are infrequent or of short duration when for other reasons). If current dressing causes regression in wound condition, may D/C ordered dressing product/s and apply Normal Saline Moist Dressing daily until next [place] / Other MD appointment. [place] of regression in wound condition at [phone]. Please direct any NON-WOUND related issues/requests for orders to patient's Primary Care Physician #1 I would like to continue to use Santyl to the right lateral [name] right lateral malleolus and right anterior shin wounds. Collagen to the new wound on the right lateral calf and continue collagen on the left first plantar metatarsal head. #2 pending this man has some form of vasculopathy. I'm glad they did a shave biopsy.I look forward to this result #3 the patient has been revascularized although I don't think that this is the etiology of any of his current wounds Electronic Signature(s) Signed: 08/05/2017 6:17:43 PM By: Linton Ham MD [place], [name] ([id number]) Entered By: Linton Ham on 08/05/2017 11:47:01 Simon, [name] ([id number]) -------------------------------------------------------------------------------- [place] Details Patient Name: [name] Date of Service: 08/05/2017 Medical Record Number: [id number] Patient Account Number: [id number] Date of Birth/Sex: 04/22/1940 (76 y.o. 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 S41.111D Laceration without foreign body of right upper arm, subsequent encounter Facility Procedures CPT4 Code Description: [id number] 11042 - DEB SUBQ TISSUE 20 SQ CM/< ICD-10 Diagnosis Description L97.511 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

## 2017-08-17 DIAGNOSIS — I2510 Atherosclerotic heart disease of native coronary artery without angina pectoris: Secondary | ICD-10-CM | POA: Diagnosis not present

## 2017-08-17 DIAGNOSIS — L97311 Non-pressure chronic ulcer of right ankle limited to breakdown of skin: Secondary | ICD-10-CM | POA: Diagnosis not present

## 2017-08-17 DIAGNOSIS — I5023 Acute on chronic systolic (congestive) heart failure: Secondary | ICD-10-CM | POA: Diagnosis not present

## 2017-08-17 DIAGNOSIS — I255 Ischemic cardiomyopathy: Secondary | ICD-10-CM | POA: Diagnosis not present

## 2017-08-17 DIAGNOSIS — L97511 Non-pressure chronic ulcer of other part of right foot limited to breakdown of skin: Secondary | ICD-10-CM | POA: Diagnosis not present

## 2017-08-17 DIAGNOSIS — I110 Hypertensive heart disease with heart failure: Secondary | ICD-10-CM | POA: Diagnosis not present

## 2017-08-19 ENCOUNTER — Encounter: Payer: Medicare Other | Attending: Nurse Practitioner | Admitting: Nurse Practitioner

## 2017-08-19 DIAGNOSIS — I255 Ischemic cardiomyopathy: Secondary | ICD-10-CM | POA: Insufficient documentation

## 2017-08-19 DIAGNOSIS — I70235 Atherosclerosis of native arteries of right leg with ulceration of other part of foot: Secondary | ICD-10-CM | POA: Diagnosis not present

## 2017-08-19 DIAGNOSIS — L97819 Non-pressure chronic ulcer of other part of right lower leg with unspecified severity: Secondary | ICD-10-CM | POA: Insufficient documentation

## 2017-08-19 DIAGNOSIS — L97516 Non-pressure chronic ulcer of other part of right foot with bone involvement without evidence of necrosis: Secondary | ICD-10-CM | POA: Diagnosis not present

## 2017-08-19 DIAGNOSIS — L97529 Non-pressure chronic ulcer of other part of left foot with unspecified severity: Secondary | ICD-10-CM | POA: Diagnosis not present

## 2017-08-19 DIAGNOSIS — I872 Venous insufficiency (chronic) (peripheral): Secondary | ICD-10-CM | POA: Diagnosis not present

## 2017-08-19 DIAGNOSIS — L97319 Non-pressure chronic ulcer of right ankle with unspecified severity: Secondary | ICD-10-CM | POA: Diagnosis not present

## 2017-08-19 DIAGNOSIS — L97812 Non-pressure chronic ulcer of other part of right lower leg with fat layer exposed: Secondary | ICD-10-CM | POA: Diagnosis not present

## 2017-08-19 NOTE — Progress Notes (Signed)
SORREN, VALLIER ([id number]) Visit Report for 08/05/2017 Arrival Information Details Patient Name: DOMENICO, ACHORD Date of Service: 08/05/2017 8:45 AM Medical Record Number: [id number] Patient Account Number: [id number] Date of Birth/Sex: 01-31-1940 (77 y.o. M) Treating RN: Ahmed Prima Primary Care Karolyn Messing: Cyndi Bender Other Clinician: Referring Kaycie Pegues: Cyndi Bender Treating Eliakim Tendler/Extender: Tito Dine in Treatment: 87 Visit Information History Since Last Visit All ordered tests and consults were completed: No Patient Arrived: Cane Added or deleted any medications: Yes Arrival Time: 09:03 Any new allergies or adverse reactions: No Accompanied By: wife Had a fall or experienced change in No Transfer Assistance: EasyPivot Patient activities of daily living that may affect Lift risk of falls: Patient Identification Verified: Yes Signs or symptoms of abuse/neglect since last visito No Secondary Verification Process Yes Hospitalized since last visit: No Completed: Implantable device outside of the clinic excluding No Patient Requires Transmission-Based No cellular tissue based products placed in the center Precautions: since last visit: Patient Has Alerts: Yes Has Dressing in Place as Prescribed: Yes Patient Alerts: R ABI >220 Has Compression in Place as Prescribed: Yes L ABI >220 Pain Present Now: No Electronic Signature(s) Signed: 08/10/2017 4:59:33 PM By: Alric Quan Entered By: Alric Quan on 08/05/2017 09:05:19 [place], [name] ([id number]) -------------------------------------------------------------------------------- Encounter Discharge Information Details Patient Name: [name] Date of Service: 08/05/2017 8:45 AM Medical Record Number: [id number] Patient Account Number: [id number] Date of Birth/Sex: March 22, 1940 (77 y.o. M) Treating RN: Montey Hora Primary Care Arihant Pennings: Cyndi Bender Other Clinician: Referring  Witt Plitt: Cyndi Bender Treating Duval Macleod/Extender: Tito Dine in Treatment: 75 Encounter Discharge Information Items Discharge Condition: Stable Ambulatory Status: Cane Discharge Destination: Home Transportation: Private Auto Accompanied By: spouse Schedule Follow-up Appointment: Yes Clinical Summary of Care: Electronic Signature(s) Signed: 08/05/2017 11:36:18 AM By: Montey Hora Entered By: Montey Hora on 08/05/2017 11:36:17 [place], [name] ([id number]) -------------------------------------------------------------------------------- Lower Extremity Assessment Details Patient Name: [name] Date of Service: 08/05/2017 8:45 AM Medical Record Number: [id number] Patient Account Number: [id number] Date of Birth/Sex: February 09, 1940 (77 y.o. M) Treating RN: Ahmed Prima Primary Care Nancylee Gaines: Cyndi Bender Other Clinician: Referring Lily Velasquez: Cyndi Bender Treating Nachelle Negrette/Extender: Tito Dine in Treatment: 17 Edema Assessment Assessed: [Left: No] [Right: No] [Left: Edema] [Right: :] Calf Left: Right: Point of Measurement: 27 cm From Medial Instep cm 28.8 cm Ankle Left: Right: Point of Measurement: 12 cm From Medial Instep cm 21.2 cm Vascular Assessment Pulses: Dorsalis Pedis Palpable: [Left:Yes] [Right:Yes] Posterior Tibial Extremity colors, hair growth, and conditions: Extremity Color: [Left:Normal] [Right:Hyperpigmented] Temperature of Extremity: [Left:Warm] [Right:Warm] Capillary Refill: [Left:< 3 seconds] [Right:< 3 seconds] Toe Nail Assessment Left: Right: Thick: Yes Yes Discolored: Yes Yes Deformed: Yes Yes Improper Length and Hygiene: Yes Yes Electronic Signature(s) Signed: 08/10/2017 4:59:33 PM By: Alric Quan Entered By: Alric Quan on 08/05/2017 09:31:34 [place], [name] ([id number]) -------------------------------------------------------------------------------- Multi Wound Chart Details Patient Name:  [name] Date of Service: 08/05/2017 8:45 AM Medical Record Number: [id number] Patient Account Number: [id number] Date of Birth/Sex: [birth year] (77 y.o. M) Treating RN: Cornell Barman Primary Care Ayame Rena: Cyndi Bender Other Clinician: Referring Alper Guilmette: Cyndi Bender Treating Shimshon Narula/Extender: Tito Dine in Treatment: 17 Vital Signs Height(in): 68 Pulse(bpm): 5 Weight(lbs): Blood Pressure(mmHg): 97/49 Body Mass Index(BMI): Temperature(F): 97.7 Respiratory Rate 16 (breaths/min): Photos: [10:No Photos] [11:No Photos] [13:No Photos] Wound Location: [10:Right, Distal, Anterior Lower Leg] [11:Right, Medial, Posterior Lower Leg] [13:Right Forearm] Wounding Event: [10:Gradually Appeared] [11:Not Known] [13:Trauma] Primary Etiology: [10:Venous Leg Ulcer] [11:Venous Leg Ulcer] [13:Skin Tear]  Comorbid History: [10:N/A] [11:N/A] [13:N/A] Date Acquired: [10:07/08/2017] [11:07/15/2017] [13:07/10/2017] Weeks of Treatment: [10:4] [11:3] [13:3] Wound Status: [10:Healed - Epithelialized] [11:Healed - Epithelialized] [13:Healed - Epithelialized] Measurements L x W x D [10:0x0x0] [11:0x0x0] [13:0x0x0] (cm) Area (cm) : [10:0] [11:0] [13:0] Volume (cm) : [10:0] [11:0] [13:0] % Reduction in Area: [10:100.00%] [11:100.00%] [13:100.00%] % Reduction in Volume: [10:100.00%] [11:100.00%] [13:100.00%] Classification: [10:Full Thickness Without Exposed Support Structures] [11:Partial Thickness] [13:Full Thickness Without Exposed Support Structures] Exudate Amount: [10:N/A] [11:N/A] [13:N/A] Exudate Type: [10:N/A] [11:N/A] [13:N/A] Exudate Color: [10:N/A] [11:N/A] [13:N/A] Wound Margin: [10:N/A] [11:N/A] [13:N/A] Granulation Amount: [10:N/A] [11:N/A] [13:N/A] Granulation Quality: [10:N/A] [11:N/A] [13:N/A] Necrotic Amount: [10:N/A] [11:N/A] [13:N/A] Necrotic Tissue: [10:N/A] [11:N/A] [13:N/A] Epithelialization: [10:N/A] [11:N/A] [13:N/A] Debridement: [10:N/A] [11:N/A]  [13:N/A] Pain Control: [10:N/A] [11:N/A] [13:N/A] Tissue Debrided: [10:N/A] [11:N/A] [13:N/A] Level: [10:N/A] [11:N/A] [13:N/A] Debridement Area (sq cm): [10:N/A] [11:N/A] [13:N/A] Instrument: [10:N/A] [11:N/A] [13:N/A] Bleeding: [10:N/A] [11:N/A] [13:N/A] Hemostasis Achieved: [10:N/A] [11:N/A] [13:N/A] Debridement Treatment [10:N/A] [11:N/A] [13:N/A] Response: ISSAAC, SHIPPER ([id number]) Post Debridement N/A N/A N/A Measurements L x W x D (cm) Post Debridement Volume: N/A N/A N/A (cm) Periwound Skin Texture: No Abnormalities Noted No Abnormalities Noted No Abnormalities Noted Periwound Skin Moisture: No Abnormalities Noted No Abnormalities Noted No Abnormalities Noted Periwound Skin Color: No Abnormalities Noted No Abnormalities Noted No Abnormalities Noted Erythema Location: N/A N/A N/A Temperature: N/A N/A N/A Tenderness on Palpation: No No No Wound Preparation: N/A N/A N/A Procedures Performed: N/A N/A N/A Wound Number: [id number]  Photos: No Photos No Photos No Photos Wound Location: Right Lower Leg - Anterior, Right Lower Leg - Lateral Right Malleolus - Lateral Proximal Wounding Event: Gradually Appeared Gradually Appeared Gradually Appeared Primary Etiology: Venous Leg Ulcer Venous Leg Ulcer Arterial Insufficiency Ulcer Comorbid History: Congestive Heart Failure, Congestive Heart Failure, Congestive Heart Failure, Coronary Artery Disease, Coronary Artery Disease, Coronary Artery Disease, Hypertension, Myocardial Hypertension, Myocardial Hypertension, Myocardial Infarction, Osteoarthritis Infarction, Osteoarthritis Infarction, Osteoarthritis Date Acquired: 07/22/2017 08/05/2017 03/23/2017 Weeks of Treatment: 2 0 17 Wound Status: Open Open Open Measurements L x W x D 1x0.7x0.1 0.8x0.3x0.1 0.4x0.4x0.3 (cm) Area (cm) : 0.55 0.188 0.126 Volume (cm) : 0.055 0.019 0.038 % Reduction in Area: -75.20% N/A -306.50% % Reduction in Volume: -77.40% N/A -1166.70% Classification:  Full Thickness Without Full Thickness Without Full Thickness Without Exposed Support Structures Exposed Support Structures Exposed Support Structures Exudate Amount: Large Large Large Exudate Type: Serosanguineous Serosanguineous Serous Exudate Color: red, brown red, brown amber Wound Margin: Distinct, outline attached Distinct, outline attached Distinct, outline attached Granulation Amount: None Present (0%) Medium (34-66%) None Present (0%) Granulation Quality: N/A Red N/A Necrotic Amount: Large (67-100%) Medium (34-66%) Large (67-100%) Necrotic Tissue: Eschar, Adherent [place], [place] Exposed Structures: Fascia: No Fascia: No Fascia: No Fat Layer (Subcutaneous Fat Layer (Subcutaneous Fat Layer (Subcutaneous Tissue) Exposed: No Tissue) Exposed: No Tissue) Exposed: No Tendon: No Tendon: No Tendon: No Muscle: No Muscle: No Muscle: No Joint: No Joint: No Joint: No Bone: No Bone: No Bone: No Epithelialization: None None None Debridement: Debridement - Excisional N/A Debridement - Excisional Pre-procedure 09:49 N/A 09:49 Verification/Time Out Taken: Pain Control: Other N/A Other Tissue Debrided: Necrotic/Eschar, N/A Subcutaneous, Slough Subcutaneous BAYRON, DALTO. ([id number]) Level: Skin/Subcutaneous Tissue N/A Skin/Subcutaneous Tissue Debridement Area (sq cm): 0.7 N/A 0.16 Instrument: Curette N/A Curette Bleeding: Minimum N/A Minimum Hemostasis Achieved: Pressure N/A Pressure Debridement Treatment Procedure was tolerated well N/A Procedure was tolerated well Response: Post Debridement 1x0.7x0.2 N/A 0.4x0.4x0.3 Measurements L x W x D (cm) Post  Debridement Volume: 0.11 N/A 0.038 (cm) Periwound Skin Texture: No Abnormalities Noted No Abnormalities Noted Callus: Yes Excoriation: No Induration: No Crepitus: No Rash: No Scarring: No Periwound Skin Moisture: No Abnormalities Noted Maceration: No Maceration: No Dry/Scaly:  No Periwound Skin Color: Erythema: Yes No Abnormalities Noted Erythema: Yes Atrophie Blanche: No Cyanosis: No Ecchymosis: No Hemosiderin Staining: No Mottled: No Pallor: No Rubor: No Erythema Location: Circumferential N/A Circumferential Temperature: No Abnormality No Abnormality No Abnormality Tenderness on Palpation: Yes Yes Yes Wound Preparation: Ulcer Cleansing: Ulcer Cleansing: Ulcer Cleansing: Other: soap Rinsed/Irrigated with Saline, Rinsed/Irrigated with Saline, and water Other: soap and water Other: soap and water Topical Anesthetic Applied: Topical Anesthetic Applied: Topical Anesthetic Applied: Other: lidocaine 4% Other: lidocaine 4% Other: lidocaine 4% Procedures Performed: Debridement N/A Debridement Wound Number: [id number] N/A Photos: No Photos No Photos N/A Wound Location: Right Foot - Lateral Left Metatarsal head first N/A Wounding Event: Gradually Appeared Gradually Appeared N/A Primary Etiology: Arterial Insufficiency Ulcer Pressure Ulcer N/A Comorbid History: Congestive Heart Failure, Congestive Heart Failure, N/A Coronary Artery Disease, Coronary Artery Disease, Hypertension, Myocardial Hypertension, Myocardial Infarction, Osteoarthritis Infarction, Osteoarthritis Date Acquired: 04/29/2017 05/06/2017 N/A Weeks of Treatment: 14 11 N/A Wound Status: Open Open N/A Measurements L x W x D 0.8x0.8x0.4 0.3x0.2x0.1 N/A (cm) Area (cm) : 0.503 0.047 N/A Volume (cm) : 0.201 0.005 N/A % Reduction in Area: -608.50% 76.00% N/A % Reduction in Volume: -2771.40% 75.00% N/A Classification: Category/Stage II N/A Capes, Sheila E. ([id number]) Full Thickness With Exposed Support Structures Exudate Amount: Large Small N/A Exudate Type: Serous Serous N/A Exudate Color: amber amber N/A Wound Margin: Distinct, outline attached Flat and Intact N/A Granulation Amount: None Present (0%) None Present (0%) N/A Granulation Quality: N/A N/A N/A Necrotic Amount: Large (67-100%)  Large (67-100%) N/A Necrotic Tissue: Adherent Slough Adherent Slough N/A Exposed Structures: Fat Layer (Subcutaneous Fascia: No N/A Tissue) Exposed: Yes Fat Layer (Subcutaneous Bone: Yes Tissue) Exposed: No Fascia: No Tendon: No Tendon: No Muscle: No Muscle: No Joint: No Joint: No Bone: No Epithelialization: None None N/A Debridement: Debridement - Excisional N/A N/A Pre-procedure 09:49 N/A N/A Verification/Time Out Taken: Pain Control: Other N/A N/A Tissue Debrided: Subcutaneous, Slough N/A N/A Level: Skin/Subcutaneous Tissue N/A N/A Debridement Area (sq cm): 0.64 N/A N/A Instrument: Curette N/A N/A Bleeding: Minimum N/A N/A Hemostasis Achieved: Pressure N/A N/A Debridement Treatment Procedure was tolerated well N/A N/A Response: Post Debridement 0.8x0.8x0.4 N/A N/A Measurements L x W x D (cm) Post Debridement Volume: 0.201 N/A N/A (cm) Periwound Skin Texture: Callus: Yes Callus: Yes N/A Excoriation: No Induration: No Crepitus: No Rash: No Scarring: No Periwound Skin Moisture: Maceration: Yes Maceration: Yes N/A Dry/Scaly: No Periwound Skin Color: Erythema: Yes Atrophie Blanche: No N/A Cyanosis: No Ecchymosis: No Erythema: No Hemosiderin Staining: No Mottled: No Pallor: No Rubor: No Erythema Location: Circumferential N/A N/A Temperature: No Abnormality No Abnormality N/A Tenderness on Palpation: Yes No N/A Wound Preparation: Ulcer Cleansing: Other: soap Ulcer Cleansing: N/A and water Rinsed/Irrigated with Saline, Other: soap and water Goodnough, Keenen E. ([id number]) Topical Anesthetic Applied: Topical Anesthetic Applied: Other: lidocaine 4% Other: lidocaine 4% Procedures Performed: Debridement N/A N/A Treatment Notes Electronic Signature(s) Signed: 08/05/2017 6:17:43 PM By: Linton Ham MD Entered By: Linton Ham on 08/05/2017 10:49:39 [place], [name]  ([id number]) -------------------------------------------------------------------------------- Multi-Disciplinary Care Plan Details Patient Name: [name] Date of Service: 08/05/2017 8:45 AM Medical Record Number: [id number] Patient Account Number: [id number] Date of Birth/Sex: 11-08-1940 (76 y.o. M) Treating RN: Cornell Barman Primary Care Rosanna Bickle: Cyndi Bender  Other Clinician: Referring Jerney Baksh: Cyndi Bender Treating Yolonda Purtle/Extender: Tito Dine in Treatment: 17 Active Inactive ` Abuse / Safety / Falls / Self Care Management Nursing Diagnoses: History of Falls Goals: Patient will remain injury free related to falls Date Initiated: 04/08/2017 Target Resolution Date: 05/08/2017 Goal Status: Active Interventions: Assess fall risk on admission and as needed Notes: ` Orientation to the Wound Care Program Nursing Diagnoses: Knowledge deficit related to the wound healing center program Goals: Patient/caregiver will verbalize understanding of the [place] Date Initiated: 04/08/2017 Target Resolution Date: 05/08/2017 Goal Status: Active Interventions: Provide education on orientation to the wound center Notes: ` Soft Tissue Infection Nursing Diagnoses: Impaired tissue integrity Potential for infection: soft tissue Goals: Patient will remain free of wound infection Date Initiated: 04/08/2017 Target Resolution Date: 05/08/2017 Goal Status: Active CLOYD, RAGAS ([id number]) Interventions: Assess signs and symptoms of infection every visit Notes: ` Wound/Skin Impairment Nursing Diagnoses: Impaired tissue integrity Goals: Ulcer/skin breakdown will heal within 14 weeks Date Initiated: 04/08/2017 Target Resolution Date: 07/20/2017 Goal Status: Active Interventions: Assess patient/caregiver ability to perform ulcer/skin care regimen upon admission and as needed Provide education on ulcer and skin care Treatment Activities: Topical wound  management initiated : 04/08/2017 Notes: Electronic Signature(s) Signed: 08/07/2017 6:17:48 PM By: Gretta Cool, BSN, RN, CWS, Kim RN, BSN Entered By: Gretta Cool, BSN, RN, CWS, Kim on 08/05/2017 09:48:08 [place], [name] ([id number]) -------------------------------------------------------------------------------- Pain Assessment Details Patient Name: [name] Date of Service: 08/05/2017 8:45 AM Medical Record Number: [id number] Patient Account Number: [id number] Date of Birth/Sex: Sep 17, 1940 (76 y.o. M) Treating RN: Ahmed Prima Primary Care Jamila Slatten: Cyndi Bender Other Clinician: Referring Margot Oriordan: Cyndi Bender Treating Lennox Dolberry/Extender: Tito Dine in Treatment: 17 Active Problems Location of Pain Severity and Description of Pain Patient Has Paino No Site Locations Pain Management and Medication Current Pain Management: Electronic Signature(s) Signed: 08/10/2017 4:59:33 PM By: Alric Quan Entered By: Alric Quan on 08/05/2017 09:05:24 [place]elson, [name] ([id number]) -------------------------------------------------------------------------------- Patient/Caregiver Education Details Patient Name: [name] Date of Service: 08/05/2017 8:45 AM Medical Record Number: [id number] Patient Account Number: [id number] Date of Birth/Gender: December 14, 1940 (76 y.o. M) Treating RN: Montey Hora Primary Care Physician: Cyndi Bender Other Clinician: Referring Physician: Cyndi Bender Treating Physician/Extender: Tito Dine in Treatment: 17 Education Assessment Education Provided To: Patient Education Topics Provided Venous: Handouts: Other: leg elevation Methods: Explain/Verbal Responses: State content correctly Electronic Signature(s) Signed: 08/05/2017 5:29:24 PM By: Montey Hora Entered By: Montey Hora on 08/05/2017 11:36:59 Debell, [name]  ([id number]) -------------------------------------------------------------------------------- Wound Assessment Details Patient Name: [name] Date of Service: 08/05/2017 8:45 AM Medical Record Number: [id number] Patient Account Number: [id number] Date of Birth/Sex: Jul 12, 1940 (76 y.o. M) Treating RN: Ahmed Prima Primary Care Joziyah Roblero: Cyndi Bender Other Clinician: Referring Jilda Kress: Cyndi Bender Treating Sharelle Burditt/Extender: Tito Dine in Treatment: 17 Wound Status Wound Number: 10 Primary Etiology: Venous Leg Ulcer Wound Location: Right, Distal, Anterior Lower Leg Wound Status: Healed - Epithelialized Wounding Event: Gradually Appeared Date Acquired: 07/08/2017 Weeks Of Treatment: 4 Clustered Wound: No Photos Photo Uploaded By: Alric Quan on 08/06/2017 08:00:42 Wound Measurements Length: (cm) 0 Width: (cm) 0 Depth: (cm) 0 Area: (cm) 0 Volume: (cm) 0 % Reduction in Area: 100% % Reduction in Volume: 100% Wound Description Full Thickness Without Exposed Support Classification: Structures Periwound Skin Texture Texture Color No Abnormalities Noted: No No Abnormalities Noted: No Moisture No Abnormalities Noted: No Electronic Signature(s) Signed: 08/10/2017 4:59:33 PM By: Alric Quan Entered By: Alric Quan on  08/05/2017 09:27:23 BURLEY, KOPKA ([id number]) -------------------------------------------------------------------------------- Wound Assessment Details Patient Name: CRISTOBAL, ADVANI Date of Service: 08/05/2017 8:45 AM Medical Record Number: [id number] Patient Account Number: [id number] Date of Birth/Sex: February 13, 1940 (76 y.o. M) Treating RN: Ahmed Prima Primary Care Amarie Tarte: Cyndi Bender Other Clinician: Referring Salim Forero: Cyndi Bender Treating Kinzy Weyers/Extender: Tito Dine in Treatment: 17 Wound Status Wound Number: 11 Primary Etiology: Venous Leg Ulcer Wound Location: Right, Medial,  Posterior Lower Leg Wound Status: Healed - Epithelialized Wounding Event: Not Known Date Acquired: 07/15/2017 Weeks Of Treatment: 3 Clustered Wound: No Photos Photo Uploaded By: Alric Quan on 08/06/2017 08:02:41 Wound Measurements Length: (cm) 0 % Width: (cm) 0 % Depth: (cm) 0 Area: (cm) 0 Volume: (cm) 0 Reduction in Area: 100% Reduction in Volume: 100% Wound Description Classification: Partial Thickness Periwound Skin Texture Texture Color No Abnormalities Noted: No No Abnormalities Noted: No Moisture No Abnormalities Noted: No Electronic Signature(s) Signed: 08/10/2017 4:59:33 PM By: Alric Quan Entered By: Alric Quan on 08/05/2017 09:26:40 [place]son, [name] ([id number]) -------------------------------------------------------------------------------- Wound Assessment Details Patient Name: [name] Date of Service: 08/05/2017 8:45 AM Medical Record Number: [id number] Patient Account Number: [id number] Date of Birth/Sex: 09-01-1940 (76 y.o. M) Treating RN: Ahmed Prima Primary Care Tedrick Port: Cyndi Bender Other Clinician: Referring Kashawn Manzano: Cyndi Bender Treating Camaryn Lumbert/Extender: Tito Dine in Treatment: 17 Wound Status Wound Number: 13 Primary Etiology: Skin Tear Wound Location: Right Forearm Wound Status: Healed - Epithelialized Wounding Event: Trauma Date Acquired: 07/10/2017 Weeks Of Treatment: 3 Clustered Wound: No Photos Photo Uploaded By: Alric Quan on 08/06/2017 08:02:41 Wound Measurements Length: (cm) 0 Width: (cm) 0 Depth: (cm) 0 Area: (cm) 0 Volume: (cm) 0 % Reduction in Area: 100% % Reduction in Volume: 100% Wound Description Full Thickness Without Exposed Support Classification: Structures Periwound Skin Texture Texture Color No Abnormalities Noted: No No Abnormalities Noted: No Moisture No Abnormalities Noted: No Electronic Signature(s) Signed: 08/10/2017 4:59:33 PM By: Alric Quan Entered By: Alric Quan on 08/05/2017 09:19:35 Tennyson, [name] ([id number]) -------------------------------------------------------------------------------- Wound Assessment Details Patient Name: [name] Date of Service: 08/05/2017 8:45 AM Medical Record Number: [id number] Patient Account Number: [id number] Date of Birth/Sex: 06-03-1940 (76 y.o. M) Treating RN: Ahmed Prima Primary Care Donnette Macmullen: Cyndi Bender Other Clinician: Referring Keiara Sneeringer: Cyndi Bender Treating Braedon Sjogren/Extender: Tito Dine in Treatment: 17 Wound Status Wound Number: 15 Primary Venous Leg Ulcer Etiology: Wound Location: Right Lower Leg - Anterior, Proximal Wound Open Wounding Event: Gradually Appeared Status: Date Acquired: 07/22/2017 Comorbid Congestive Heart Failure, Coronary Artery Weeks Of Treatment: 2 History: Disease, Hypertension, Myocardial Infarction, Clustered Wound: No Osteoarthritis Photos Photo Uploaded By: Alric Quan on 08/06/2017 08:03:25 Wound Measurements Length: (cm) 1 Width: (cm) 0.7 Depth: (cm) 0.1 Area: (cm) 0.55 Volume: (cm) 0.055 % Reduction in Area: -75.2% % Reduction in Volume: -77.4% Epithelialization: None Tunneling: No Undermining: No Wound Description Full Thickness Without Exposed Support Classification: Structures Wound Margin: Distinct, outline attached Exudate Large Amount: Exudate Type: Serosanguineous Exudate Color: red, brown Foul Odor After Cleansing: No Slough/Fibrino Yes Wound Bed Granulation Amount: None Present (0%) Exposed Structure Necrotic Amount: Large (67-100%) Fascia Exposed: No Necrotic Quality: Eschar, Adherent Slough Fat Layer (Subcutaneous Tissue) Exposed: No Tendon Exposed: No Muscle Exposed: No Joint Exposed: No Bone Exposed: No [place], Briton E. ([id number]) Periwound Skin Texture Texture Color No Abnormalities Noted: No No Abnormalities Noted: No Erythema:  Yes Moisture Erythema Location: Circumferential No Abnormalities Noted: No Temperature / Pain Temperature: No Abnormality Tenderness on Palpation: Yes Wound Preparation Ulcer Cleansing:  Rinsed/Irrigated with Saline, Other: soap and water, Topical Anesthetic Applied: Other: lidocaine 4%, Treatment Notes Wound #15 (Right, Proximal, Anterior Lower Leg) 1. Cleansed with: Cleanse wound with antibacterial soap and water 2. Anesthetic Topical Lidocaine 4% cream to wound bed prior to debridement 4. Dressing Applied: Santyl Ointment 5. Secondary Dressing Applied ABD Pad 7. Secured with Other (specify in notes) Notes kerlix/coban wrap, Electronic Signature(s) Signed: 08/10/2017 4:59:33 PM By: Alric Quan Entered By: Alric Quan on 08/05/2017 09:23:53 [place], [name] ([id number]) -------------------------------------------------------------------------------- Wound Assessment Details Patient Name: [name] Date of Service: 08/05/2017 8:45 AM Medical Record Number: [id number] Patient Account Number: [id number] Date of Birth/Sex: [birth year] (76 y.o. M) Treating RN: Ahmed Prima Primary Care Ziyad Dyar: Cyndi Bender Other Clinician: Referring Jackelyn Illingworth: Cyndi Bender Treating Bryen Hinderman/Extender: Tito Dine in Treatment: 17 Wound Status Wound Number: 16 Primary Venous Leg Ulcer Etiology: Wound Location: Right Lower Leg - Lateral Wound Open Wounding Event: Gradually Appeared Status: Date Acquired: 08/05/2017 Comorbid Congestive Heart Failure, Coronary Artery Weeks Of Treatment: 0 History: Disease, Hypertension, Myocardial Infarction, Clustered Wound: No Osteoarthritis Photos Photo Uploaded By: Alric Quan on 08/06/2017 08:03:26 Wound Measurements Length: (cm) 0.8 Width: (cm) 0.3 Depth: (cm) 0.1 Area: (cm) 0.188 Volume: (cm) 0.019 % Reduction in Area: % Reduction in Volume: Epithelialization: None Tunneling: No Undermining:  No Wound Description Full Thickness Without Exposed Support Classification: Structures Wound Margin: Distinct, outline attached Exudate Large Amount: Exudate Type: Serosanguineous Exudate Color: red, brown Foul Odor After Cleansing: No Slough/Fibrino Yes Wound Bed Granulation Amount: Medium (34-66%) Exposed Structure Granulation Quality: Red Fascia Exposed: No Necrotic Amount: Medium (34-66%) Fat Layer (Subcutaneous Tissue) Exposed: No Necrotic Quality: Eschar, Adherent Slough Tendon Exposed: No Muscle Exposed: No Joint Exposed: No Bone Exposed: No Dickard, Alvan E. ([id number]) Periwound Skin Texture Texture Color No Abnormalities Noted: No No Abnormalities Noted: No Moisture Temperature / Pain No Abnormalities Noted: No Temperature: No Abnormality Maceration: No Tenderness on Palpation: Yes Wound Preparation Ulcer Cleansing: Rinsed/Irrigated with Saline, Other: soap and water, Topical Anesthetic Applied: Other: lidocaine 4%, Treatment Notes Wound #16 (Right, Lateral Lower Leg) 1. Cleansed with: Clean wound with Normal Saline 2. Anesthetic Topical Lidocaine 4% cream to wound bed prior to debridement 4. Dressing Applied: Prisma Ag 5. Secondary Dressing Applied Dry Hadar Notes gauze, kerlix/coban wrap on right lateral lower leg wound Electronic Signature(s) Signed: 08/10/2017 4:59:33 PM By: Alric Quan Entered By: Alric Quan on 08/05/2017 09:29:14 Screws, [name] ([id number]) -------------------------------------------------------------------------------- Wound Assessment Details Patient Name: [name] Date of Service: 08/05/2017 8:45 AM Medical Record Number: [id number] Patient Account Number: [id number] Date of Birth/Sex: 1940-08-03 (76 y.o. M) Treating RN: Ahmed Prima Primary Care Sadi Arave: Cyndi Bender Other Clinician: Referring Doreene Forrey: Cyndi Bender Treating Emy Angevine/Extender: Tito Dine in  Treatment: 17 Wound Status Wound Number: 6 Primary Arterial Insufficiency Ulcer Etiology: Wound Location: Right Malleolus - Lateral Wound Open Wounding Event: Gradually Appeared Status: Date Acquired: 03/23/2017 Comorbid Congestive Heart Failure, Coronary Artery Weeks Of Treatment: 17 History: Disease, Hypertension, Myocardial Infarction, Clustered Wound: No Osteoarthritis Photos Photo Uploaded By: Alric Quan on 08/06/2017 08:08:11 Wound Measurements Length: (cm) 0.4 Width: (cm) 0.4 Depth: (cm) 0.3 Area: (cm) 0.126 Volume: (cm) 0.038 % Reduction in Area: -306.5% % Reduction in Volume: -1166.7% Epithelialization: None Tunneling: No Undermining: No Wound Description Full Thickness Without Exposed Support Classification: Structures Wound Margin: Distinct, outline attached Exudate Large Amount: Exudate Type: Serous Exudate Color: amber Foul Odor After Cleansing: No Slough/Fibrino Yes Wound Bed Granulation Amount: None Present (0%) Exposed  Structure Necrotic Amount: Large (67-100%) Fascia Exposed: No Necrotic Quality: Adherent Slough Fat Layer (Subcutaneous Tissue) Exposed: No Tendon Exposed: No Muscle Exposed: No Joint Exposed: No Bone Exposed: No Perra, Nicholus E. ([id number]) Periwound Skin Texture Texture Color No Abnormalities Noted: No No Abnormalities Noted: No Callus: Yes Atrophie Blanche: No Crepitus: No Cyanosis: No Excoriation: No Ecchymosis: No Induration: No Erythema: Yes Rash: No Erythema Location: Circumferential Scarring: No Hemosiderin Staining: No Mottled: No Moisture Pallor: No No Abnormalities Noted: No Rubor: No Dry / Scaly: No Maceration: No Temperature / Pain Temperature: No Abnormality Tenderness on Palpation: Yes Wound Preparation Ulcer Cleansing: Other: soap and water, Topical Anesthetic Applied: Other: lidocaine 4%, Treatment Notes Wound #6 (Right, Lateral Malleolus) 1. Cleansed with: Cleanse wound with  antibacterial soap and water 2. Anesthetic Topical Lidocaine 4% cream to wound bed prior to debridement 4. Dressing Applied: Santyl Ointment 5. Secondary Dressing Applied ABD Pad 7. Secured with Other (specify in notes) Notes kerlix/coban wrap, Electronic Signature(s) Signed: 08/10/2017 4:59:33 PM By: Alric Quan Entered By: Alric Quan on 08/05/2017 09:22:30 Klemp, [name] ([id number]) -------------------------------------------------------------------------------- Wound Assessment Details Patient Name: [name] Date of Service: 08/05/2017 8:45 AM Medical Record Number: [id number] Patient Account Number: [id number] Date of Birth/Sex: 02-09-1940 (76 y.o. M) Treating RN: Ahmed Prima Primary Care Latavion Halls: Cyndi Bender Other Clinician: Referring Tamari Busic: Cyndi Bender Treating Sola Margolis/Extender: Tito Dine in Treatment: 17 Wound Status Wound Number: 8 Primary Arterial Insufficiency Ulcer Etiology: Wound Location: Right Foot - Lateral Wound Open Wounding Event: Gradually Appeared Status: Date Acquired: 04/29/2017 Comorbid Congestive Heart Failure, Coronary Artery Weeks Of Treatment: 14 History: Disease, Hypertension, Myocardial Infarction, Clustered Wound: No Osteoarthritis Photos Photo Uploaded By: Alric Quan on 08/06/2017 08:08:12 Wound Measurements Length: (cm) 0.8 Width: (cm) 0.8 Depth: (cm) 0.4 Area: (cm) 0.503 Volume: (cm) 0.201 % Reduction in Area: -608.5% % Reduction in Volume: -2771.4% Epithelialization: None Tunneling: No Undermining: No Wound Description Full Thickness With Exposed Support Classification: Structures Wound Margin: Distinct, outline attached Exudate Large Amount: Exudate Type: Serous Exudate Color: amber Foul Odor After Cleansing: No Slough/Fibrino Yes Wound Bed Granulation Amount: None Present (0%) Exposed Structure Necrotic Amount: Large (67-100%) Fascia Exposed: No Necrotic  Quality: Adherent Slough Fat Layer (Subcutaneous Tissue) Exposed: Yes Tendon Exposed: No Muscle Exposed: No Joint Exposed: No Bone Exposed: Yes Reiling, Cameo E. ([id number]) Periwound Skin Texture Texture Color No Abnormalities Noted: No No Abnormalities Noted: No Callus: Yes Erythema: Yes Erythema Location: Circumferential Moisture No Abnormalities Noted: No Temperature / Pain Maceration: Yes Temperature: No Abnormality Tenderness on Palpation: Yes Wound Preparation Ulcer Cleansing: Other: soap and water, Topical Anesthetic Applied: Other: lidocaine 4%, Treatment Notes Wound #8 (Right, Lateral Foot) 1. Cleansed with: Cleanse wound with antibacterial soap and water 2. Anesthetic Topical Lidocaine 4% cream to wound bed prior to debridement 4. Dressing Applied: Santyl Ointment 5. Secondary Dressing Applied ABD Pad 7. Secured with Other (specify in notes) Notes kerlix/coban wrap, Electronic Signature(s) Signed: 08/10/2017 4:59:33 PM By: Alric Quan Entered By: Alric Quan on 08/05/2017 09:21:47 Felicetti, [name] ([id number]) -------------------------------------------------------------------------------- Wound Assessment Details Patient Name: [name] Date of Service: 08/05/2017 8:45 AM Medical Record Number: [id number] Patient Account Number: [id number] Date of Birth/Sex: 1940-03-06 (76 y.o. M) Treating RN: Ahmed Prima Primary Care Treina Arscott: Cyndi Bender Other Clinician: Referring Dez Stauffer: Cyndi Bender Treating Lagina Reader/Extender: Tito Dine in Treatment: 17 Wound Status Wound Number: 9 Primary Pressure Ulcer Etiology: Wound Location: Left Metatarsal head first Wound Open Wounding Event: Gradually Appeared Status: Date Acquired: 05/06/2017  Comorbid Congestive Heart Failure, Coronary Artery Weeks Of Treatment: 11 History: Disease, Hypertension, Myocardial Infarction, Clustered Wound: No Osteoarthritis Photos Photo  Uploaded By: Alric Quan on 08/06/2017 08:08:33 Wound Measurements Length: (cm) 0.3 Width: (cm) 0.2 Depth: (cm) 0.1 Area: (cm) 0.047 Volume: (cm) 0.005 % Reduction in Area: 76% % Reduction in Volume: 75% Epithelialization: None Tunneling: No Undermining: No Wound Description Classification: Category/Stage II Wound Margin: Flat and Intact Exudate Amount: Small Exudate Type: Serous Exudate Color: amber Foul Odor After Cleansing: No Slough/Fibrino Yes Wound Bed Granulation Amount: None Present (0%) Exposed Structure Necrotic Amount: Large (67-100%) Fascia Exposed: No Necrotic Quality: Adherent Slough Fat Layer (Subcutaneous Tissue) Exposed: No Tendon Exposed: No Muscle Exposed: No Joint Exposed: No Bone Exposed: No Periwound Skin Texture Uphoff, Sukhraj E. ([id number]) Texture Color No Abnormalities Noted: No No Abnormalities Noted: No Callus: Yes Atrophie Blanche: No Crepitus: No Cyanosis: No Excoriation: No Ecchymosis: No Induration: No Erythema: No Rash: No Hemosiderin Staining: No Scarring: No Mottled: No Pallor: No Moisture Rubor: No No Abnormalities Noted: No Dry / Scaly: No Temperature / Pain Maceration: Yes Temperature: No Abnormality Wound Preparation Ulcer Cleansing: Rinsed/Irrigated with Saline, Other: soap and water, Topical Anesthetic Applied: Other: lidocaine 4%, Treatment Notes Wound #9 (Left Metatarsal head first) 1. Cleansed with: Clean wound with Normal Saline 2. Anesthetic Topical Lidocaine 4% cream to wound bed prior to debridement 4. Dressing Applied: Prisma Ag 5. Secondary Dressing Applied Dry Delia Notes gauze, kerlix/coban wrap on right lateral lower leg wound Electronic Signature(s) Signed: 08/10/2017 4:59:33 PM By: Alric Quan Entered By: Alric Quan on 08/05/2017 09:20:31 Hutchins, [name] ([id number]) -------------------------------------------------------------------------------- Vitals  Details Patient Name: [name] Date of Service: 08/05/2017 8:45 AM Medical Record Number: [id number] Patient Account Number: [id number] Date of Birth/Sex: Oct 18, 1940 (76 y.o. M) Treating RN: Ahmed Prima Primary Care Alyxis Grippi: Cyndi Bender Other Clinician: Referring Florabel Faulks: Cyndi Bender Treating Starsha Morning/Extender: Tito Dine in Treatment: 17 Vital Signs Time Taken: 09:05 Temperature (F): 97.7 Height (in): 68 Pulse (bpm): 67 Respiratory Rate (breaths/min): 16 Blood Pressure (mmHg): 97/49 Reference Range: 80 - 120 mg / dl Notes Took BP manually 90/58. Made Dr. Dellia Nims aware of BP. Electronic Signature(s) Signed: 08/10/2017 4:59:33 PM By: Alric Quan Entered By: Alric Quan on 08/05/2017 09:09:07

## 2017-08-21 DIAGNOSIS — I255 Ischemic cardiomyopathy: Secondary | ICD-10-CM | POA: Diagnosis not present

## 2017-08-21 DIAGNOSIS — I2510 Atherosclerotic heart disease of native coronary artery without angina pectoris: Secondary | ICD-10-CM | POA: Diagnosis not present

## 2017-08-21 DIAGNOSIS — L97311 Non-pressure chronic ulcer of right ankle limited to breakdown of skin: Secondary | ICD-10-CM | POA: Diagnosis not present

## 2017-08-21 DIAGNOSIS — L97511 Non-pressure chronic ulcer of other part of right foot limited to breakdown of skin: Secondary | ICD-10-CM | POA: Diagnosis not present

## 2017-08-21 DIAGNOSIS — I110 Hypertensive heart disease with heart failure: Secondary | ICD-10-CM | POA: Diagnosis not present

## 2017-08-21 DIAGNOSIS — I5023 Acute on chronic systolic (congestive) heart failure: Secondary | ICD-10-CM | POA: Diagnosis not present

## 2017-08-24 DIAGNOSIS — I5023 Acute on chronic systolic (congestive) heart failure: Secondary | ICD-10-CM | POA: Diagnosis not present

## 2017-08-24 DIAGNOSIS — I2510 Atherosclerotic heart disease of native coronary artery without angina pectoris: Secondary | ICD-10-CM | POA: Diagnosis not present

## 2017-08-24 DIAGNOSIS — L97311 Non-pressure chronic ulcer of right ankle limited to breakdown of skin: Secondary | ICD-10-CM | POA: Diagnosis not present

## 2017-08-24 DIAGNOSIS — I255 Ischemic cardiomyopathy: Secondary | ICD-10-CM | POA: Diagnosis not present

## 2017-08-24 DIAGNOSIS — L97511 Non-pressure chronic ulcer of other part of right foot limited to breakdown of skin: Secondary | ICD-10-CM | POA: Diagnosis not present

## 2017-08-24 DIAGNOSIS — I110 Hypertensive heart disease with heart failure: Secondary | ICD-10-CM | POA: Diagnosis not present

## 2017-08-26 DIAGNOSIS — R269 Unspecified abnormalities of gait and mobility: Secondary | ICD-10-CM | POA: Diagnosis not present

## 2017-08-26 DIAGNOSIS — I5023 Acute on chronic systolic (congestive) heart failure: Secondary | ICD-10-CM | POA: Diagnosis not present

## 2017-08-26 DIAGNOSIS — M059 Rheumatoid arthritis with rheumatoid factor, unspecified: Secondary | ICD-10-CM | POA: Diagnosis not present

## 2017-08-26 DIAGNOSIS — I739 Peripheral vascular disease, unspecified: Secondary | ICD-10-CM | POA: Diagnosis not present

## 2017-08-26 DIAGNOSIS — Z79899 Other long term (current) drug therapy: Secondary | ICD-10-CM | POA: Diagnosis not present

## 2017-08-26 DIAGNOSIS — I5022 Chronic systolic (congestive) heart failure: Secondary | ICD-10-CM | POA: Diagnosis not present

## 2017-08-26 DIAGNOSIS — I472 Ventricular tachycardia: Secondary | ICD-10-CM | POA: Diagnosis not present

## 2017-08-26 DIAGNOSIS — L97311 Non-pressure chronic ulcer of right ankle limited to breakdown of skin: Secondary | ICD-10-CM | POA: Diagnosis not present

## 2017-08-26 DIAGNOSIS — M47816 Spondylosis without myelopathy or radiculopathy, lumbar region: Secondary | ICD-10-CM | POA: Diagnosis not present

## 2017-08-26 DIAGNOSIS — Z95810 Presence of automatic (implantable) cardiac defibrillator: Secondary | ICD-10-CM | POA: Diagnosis not present

## 2017-08-26 DIAGNOSIS — I2510 Atherosclerotic heart disease of native coronary artery without angina pectoris: Secondary | ICD-10-CM | POA: Diagnosis not present

## 2017-08-26 DIAGNOSIS — I110 Hypertensive heart disease with heart failure: Secondary | ICD-10-CM | POA: Diagnosis not present

## 2017-08-26 DIAGNOSIS — L97511 Non-pressure chronic ulcer of other part of right foot limited to breakdown of skin: Secondary | ICD-10-CM | POA: Diagnosis not present

## 2017-08-26 DIAGNOSIS — I255 Ischemic cardiomyopathy: Secondary | ICD-10-CM | POA: Diagnosis not present

## 2017-08-27 DIAGNOSIS — L570 Actinic keratosis: Secondary | ICD-10-CM | POA: Diagnosis not present

## 2017-08-27 DIAGNOSIS — L578 Other skin changes due to chronic exposure to nonionizing radiation: Secondary | ICD-10-CM | POA: Diagnosis not present

## 2017-08-27 DIAGNOSIS — Z9889 Other specified postprocedural states: Secondary | ICD-10-CM | POA: Diagnosis not present

## 2017-08-27 DIAGNOSIS — I739 Peripheral vascular disease, unspecified: Secondary | ICD-10-CM | POA: Diagnosis not present

## 2017-08-27 DIAGNOSIS — L820 Inflamed seborrheic keratosis: Secondary | ICD-10-CM | POA: Diagnosis not present

## 2017-08-27 DIAGNOSIS — L988 Other specified disorders of the skin and subcutaneous tissue: Secondary | ICD-10-CM | POA: Diagnosis not present

## 2017-08-28 DIAGNOSIS — I2510 Atherosclerotic heart disease of native coronary artery without angina pectoris: Secondary | ICD-10-CM | POA: Diagnosis not present

## 2017-08-28 DIAGNOSIS — I5023 Acute on chronic systolic (congestive) heart failure: Secondary | ICD-10-CM | POA: Diagnosis not present

## 2017-08-28 DIAGNOSIS — L97511 Non-pressure chronic ulcer of other part of right foot limited to breakdown of skin: Secondary | ICD-10-CM | POA: Diagnosis not present

## 2017-08-28 DIAGNOSIS — I110 Hypertensive heart disease with heart failure: Secondary | ICD-10-CM | POA: Diagnosis not present

## 2017-08-28 DIAGNOSIS — L97311 Non-pressure chronic ulcer of right ankle limited to breakdown of skin: Secondary | ICD-10-CM | POA: Diagnosis not present

## 2017-08-28 DIAGNOSIS — I255 Ischemic cardiomyopathy: Secondary | ICD-10-CM | POA: Diagnosis not present

## 2017-08-31 DIAGNOSIS — I5023 Acute on chronic systolic (congestive) heart failure: Secondary | ICD-10-CM | POA: Diagnosis not present

## 2017-08-31 DIAGNOSIS — I110 Hypertensive heart disease with heart failure: Secondary | ICD-10-CM | POA: Diagnosis not present

## 2017-08-31 DIAGNOSIS — I255 Ischemic cardiomyopathy: Secondary | ICD-10-CM | POA: Diagnosis not present

## 2017-08-31 DIAGNOSIS — L97311 Non-pressure chronic ulcer of right ankle limited to breakdown of skin: Secondary | ICD-10-CM | POA: Diagnosis not present

## 2017-08-31 DIAGNOSIS — I2510 Atherosclerotic heart disease of native coronary artery without angina pectoris: Secondary | ICD-10-CM | POA: Diagnosis not present

## 2017-08-31 DIAGNOSIS — L97511 Non-pressure chronic ulcer of other part of right foot limited to breakdown of skin: Secondary | ICD-10-CM | POA: Diagnosis not present

## 2017-09-02 ENCOUNTER — Encounter: Payer: Medicare Other | Admitting: Internal Medicine

## 2017-09-02 DIAGNOSIS — I2510 Atherosclerotic heart disease of native coronary artery without angina pectoris: Secondary | ICD-10-CM | POA: Diagnosis not present

## 2017-09-02 DIAGNOSIS — Z1339 Encounter for screening examination for other mental health and behavioral disorders: Secondary | ICD-10-CM | POA: Diagnosis not present

## 2017-09-02 DIAGNOSIS — E038 Other specified hypothyroidism: Secondary | ICD-10-CM | POA: Diagnosis not present

## 2017-09-02 DIAGNOSIS — I509 Heart failure, unspecified: Secondary | ICD-10-CM | POA: Diagnosis not present

## 2017-09-02 DIAGNOSIS — L97311 Non-pressure chronic ulcer of right ankle limited to breakdown of skin: Secondary | ICD-10-CM | POA: Diagnosis not present

## 2017-09-02 DIAGNOSIS — M545 Low back pain: Secondary | ICD-10-CM | POA: Diagnosis not present

## 2017-09-02 DIAGNOSIS — L97312 Non-pressure chronic ulcer of right ankle with fat layer exposed: Secondary | ICD-10-CM | POA: Diagnosis not present

## 2017-09-02 DIAGNOSIS — L97819 Non-pressure chronic ulcer of other part of right lower leg with unspecified severity: Secondary | ICD-10-CM | POA: Diagnosis not present

## 2017-09-02 DIAGNOSIS — E039 Hypothyroidism, unspecified: Secondary | ICD-10-CM | POA: Diagnosis not present

## 2017-09-02 DIAGNOSIS — I70235 Atherosclerosis of native arteries of right leg with ulceration of other part of foot: Secondary | ICD-10-CM | POA: Diagnosis not present

## 2017-09-02 DIAGNOSIS — L97512 Non-pressure chronic ulcer of other part of right foot with fat layer exposed: Secondary | ICD-10-CM | POA: Diagnosis not present

## 2017-09-02 DIAGNOSIS — Z48817 Encounter for surgical aftercare following surgery on the skin and subcutaneous tissue: Secondary | ICD-10-CM | POA: Diagnosis not present

## 2017-09-02 DIAGNOSIS — I110 Hypertensive heart disease with heart failure: Secondary | ICD-10-CM | POA: Diagnosis not present

## 2017-09-02 DIAGNOSIS — I255 Ischemic cardiomyopathy: Secondary | ICD-10-CM | POA: Diagnosis not present

## 2017-09-02 DIAGNOSIS — L97511 Non-pressure chronic ulcer of other part of right foot limited to breakdown of skin: Secondary | ICD-10-CM | POA: Diagnosis not present

## 2017-09-02 DIAGNOSIS — M069 Rheumatoid arthritis, unspecified: Secondary | ICD-10-CM | POA: Diagnosis not present

## 2017-09-02 DIAGNOSIS — G4730 Sleep apnea, unspecified: Secondary | ICD-10-CM | POA: Diagnosis not present

## 2017-09-02 DIAGNOSIS — I70233 Atherosclerosis of native arteries of right leg with ulceration of ankle: Secondary | ICD-10-CM | POA: Diagnosis not present

## 2017-09-02 DIAGNOSIS — I779 Disorder of arteries and arterioles, unspecified: Secondary | ICD-10-CM | POA: Diagnosis not present

## 2017-09-02 DIAGNOSIS — L97529 Non-pressure chronic ulcer of other part of left foot with unspecified severity: Secondary | ICD-10-CM | POA: Diagnosis not present

## 2017-09-02 DIAGNOSIS — L97516 Non-pressure chronic ulcer of other part of right foot with bone involvement without evidence of necrosis: Secondary | ICD-10-CM | POA: Diagnosis not present

## 2017-09-02 DIAGNOSIS — I5023 Acute on chronic systolic (congestive) heart failure: Secondary | ICD-10-CM | POA: Diagnosis not present

## 2017-09-02 DIAGNOSIS — L97319 Non-pressure chronic ulcer of right ankle with unspecified severity: Secondary | ICD-10-CM | POA: Diagnosis not present

## 2017-09-02 NOTE — Progress Notes (Signed)
[name] ([id number]) Visit Report for 08/19/2017 Chief Complaint Document Details Patient Name: [name], [name] Date of Service: 08/19/2017 9:00 AM Medical Record Number: [id number] Patient Account Number: [id number] Date of Birth/Sex: 1940/02/13 (77 y.o. M) Treating RN: Cornell Barman Primary Care Provider: Cyndi Bender Other Clinician: Referring Provider: Cyndi Bender Treating Provider/Extender: Cathie Olden in Treatment: 19 Information Obtained from: Patient Chief Complaint RLE and left [name] wounds Electronic Signature(s) Signed: 08/19/2017 9:35:03 AM By: Lawanda Cousins Entered By: Lawanda Cousins on 08/19/2017 09:35:03 [name], [name] ([id number]) -------------------------------------------------------------------------------- Debridement Details Patient Name: [name] Date of Service: 08/19/2017 9:00 AM Medical Record Number: [id number] Patient Account Number: [id number] Date of Birth/Sex: 1940-08-01 (77 y.o. M) Treating RN: Cornell Barman Primary Care Provider: Cyndi Bender Other Clinician: Referring Provider: Cyndi Bender Treating Provider/Extender: Cathie Olden in Treatment: 19 Debridement Performed for Wound #15 Right,Proximal,Anterior Lower Leg Assessment: Performed By: Physician Lawanda Cousins, NP Debridement Type: Debridement Severity of Tissue Pre Fat layer exposed Debridement: Pre-procedure Verification/Time Yes - 09:22 Out Taken: Start Time: 09:22 Pain Control: Other : lidocaine 4% Total Area Debrided (L x W): 0.9 (cm) x 0.8 (cm) = 0.72 (cm) Tissue and other material Viable, Non-Viable, Slough, Subcutaneous, Slough debrided: Level: Skin/Subcutaneous Tissue Debridement Description: Excisional Instrument: Curette Bleeding: Minimum Hemostasis Achieved: Pressure End Time: 09:25 Procedural Pain: 0 Post Procedural Pain: 1 Response to Treatment: Procedure was tolerated well Level of Consciousness: Awake and Alert Post  Debridement Measurements of Total Wound Length: (cm) 0.9 Width: (cm) 0.8 Depth: (cm) 0.2 Volume: (cm) 0.113 Character of Wound/Ulcer Post Debridement: Stable Severity of Tissue Post Debridement: Fat layer exposed Post Procedure Diagnosis Same as Pre-procedure Electronic Signature(s) Signed: 08/19/2017 5:10:54 PM By: Lawanda Cousins Signed: 08/19/2017 5:18:50 PM By: Gretta Cool, BSN, RN, CWS, Kim RN, BSN Entered By: Gretta Cool, BSN, RN, CWS, Kim on 08/19/2017 09:25:19 [name], [name] ([id number]) -------------------------------------------------------------------------------- HPI Details Patient Name: [name] Date of Service: 08/19/2017 9:00 AM Medical Record Number: [id number] Patient Account Number: [id number] Date of Birth/Sex: [birth year] (77 y.o. M) Treating RN: Cornell Barman Primary Care Provider: Cyndi Bender Other Clinician: Referring Provider: Cyndi Bender Treating Provider/Extender: Cathie Olden in Treatment: 19 History of Present Illness HPI Description: 04/08/17; this is a complex 77 year old man referred here from [facility]. He had been referred there for bilateral lower extremity edema with ulcer formation predominantly on the right calf but also the right [name]. He had been receiving Unna boots bilaterally. The history here is long. He is not a diabetic however ICU looking through late 2018 he was worked up for chronic headaches, elevated inflammatory markers including C-reactive protein and ESR. He went on to actually have a left temporal artery biopsy wasn't that was negative.he received about 6 weeks of high-dose prednisone 60 mg with improvement in his inflammatory markers. He was admitted to hospital in late November with ventricular tachycardia syncope. He has known ischemic cardiomyopathy. He was admitted in the hospital in mid December. Apparently this was precipitated by a syncopal spell falling out of his scooter while at [place]. There  was ventricular arrhythmia. He has an implantable defibrillator and echocardiogram showed severe LV dysfunction with an EF of 20% and valvular regurgitations including mild AR, moderate MR. There was no stenosis. He ruled in for a non-ST elevation MI in the setting of V. tach.his wife states that sometime during this hospitalization she noted multiple areas of skin change on the right lower calf which became evident just after he left the hospital.  He was back in hospital in February with acute renal failure hyponatremia. This responded to fluid resuscitation.. Interestingly I can't see much description of his right leg at that point in time.he was followed by Dr. Nehemiah Massed of dermatology for the necrotic wounds on his right leg. Apparently a biopsy was planned at one point but not done although in some notes that suggests it was. I cannot see these results area He was noted to have a lot of edema. Was treated with bilateral Unna boots edges really helped with the swelling they have been using Bactroban to small open areas predominantly on the right anterior lower leg His history is complicated by the fact that he has rheumatoid arthritis followed by rheumatology. He is followed by neurology for disabling headaches. At one point this was felt to be giant cell arteritis although a left temporal biopsy was apparently negative. He was given a prolonged course of prednisone at 60 mg which managed his sedimentation rates but apparently did not prove improve the headaches. This is been tapered to off on by rheumatology on 03/13/17 Vascular had plans to do a venous reflux workup as well as arterial studies in May. They also wanted to get him a lymphedema pump. As mentioned he's been using bacitracin under Unna boot wraps to both lower legs 04/15/17; the patient arrives with most of his wounds improved. These are small punched out wounds. Most of them remaining ones are on the right anterior calf with the most  problematic over the right lateral malleolus. There are no new areas. The symptom complex or potential symptom complex we are dealing with his chronic disabling headaches with inflammatory markers not responsive to prednisone and with a negative temporal biopsy, lower extremity weakness, skin ulcerations just on the right leg. We have managed to get his arterial studies moved up to April 30. He has a rheumatology consult at [facility] in June. He has seen dermatology locally, rheumatology locally, neurology locally. 04/22/17; small punched out areas on the right leg anteriorly posteriorly. Most of these appear to have closed over. Some of them have eschar over the surface. The most problematic area appears to be over the right lateral malleolus. We've been using prisma to all of this. He has arterial studies on April 30 and a rheumatology consult at [facility] on June 28 04/29/17; most of the small punched out areas on the right leg posteriorly are closed. He has 2 or 3 openings anteriorly but most of these appear to be on the way to closing. Still problematically over the right lateral malleolus and right lateral [name] with almost ischemic-looking eschar. His arterial studies that I ordered are due to be done next week on the 30th so we should have them available for our next visit hopefully. He also saw a rheumatologist at [facility] and according to the patient he did 8 vials of blood. Finally he has scaling rash on his left [name] and what looks to be at [facility] area on the left anterior leg 05/06/17; most of the small punched out areas on the right leg posteriorly and anteriorly are closed. He still has one small one anteriorly one over the right lateral malleolus and one over the right lateral [name]. THOMSON, HERBERS ([id number]) He had his arterial studies they didn't seem to do waveform analysis not exactly sure why however in any case is ABIs were noncompressible bilaterally. They did provide TBIs although looking  at the pressures it appears that his TBIs are quite normal. I  therefore went ahead and debrided the area over the right lateral malleolus and the right lateral [name] 05/13/17; most of the small punched out areas on the right leg posteriorly and anteriorly are closed. He continues to have problematic areas over the right lateral malleolus and the right lateral [name]. He still requiring aggressive debridement of these 2 wounds using silver collagen I reviewed the note from rheumatology I don't think they came up with a specific diagnosis although he is known to have seropositive RA. They did a panel of lab work when I was able to see his his AMA was negative, anti-smooth muscle antibodies negative antineutrophil cytoplasmic antibodies negative,Liv/kia type 1 negative. Serum C3 and C4 were negative. I don't see his muscle enzymes specifically. He was referred back to his local rheumatologist for management of his known rheumatoid arthritis.the patient states he still feels weak and fatigued. He states he has numbness in both feet and apparently is known to have neuropathy 05/20/17; the patient continues to have a difficult problem on the right lateral malleolus and the right lateral [name]. Both of these wounds have no viable surface even with attempts at debridement. He has a new wound on the left plantar metatarsal head which looks more like a superficial diabetic pressure related injury then part of this underlying issue he has. Most of the rest of the wounds on his legs look satisfactory. Mostly on the right calf.reviewed his arterial studies which showed noncompressible vessels bilaterally but the TBIs were quite normal. 05/27/17; no real improvement in the right lateral malleolus and right lateral [name]. In fact the right lateral [name] is now on bone. I gave him doxycycline empirically last week it appears that he developed photosensitivity was 32 the son in a tractor. I'll not give him any more of this.  This is predominantly on his face and dorsal forearms and hands. I given this more as an anti- inflammatory. I'm going to have him seen by vascular surgery. His TBIs that he had done previously ordered by Dr. Brigitte Pulse were in the normal range They never did full arterial studies on him. he now has small punched out wounds on the right lateral ankle and right lateral [name]. I doubt these are ischemic however I would like a review of his macrovascular status. He does describe some pain at night. I'll reduce the compression from 3 layer to 2 layer and I'm not convinced that this is a macrovascular issue however I want to make sure. We've been using silver alginate 06/03/17; the patient's x-rays that I ordered last time of his right ankle and right lateral [name] did not show definite osteomyelitis. We've been using silver alginate. The wounds are not making any progress. The area on the plantar left first metatarsal head however appears to be better. The patient complains of weakness that is more of the fatigue. He says if he's walking his head will fall onto his chest and that his legs literally gave out on him. He did see neurology in the past however that was at a time where his workup was for temporal arteritis and headaches. 06/10/17; the patient is making no progress with the areas on the right lateral [name] and right lateral malleolus. In fact the area on the [name] probes to bone. X-ray did not show osteomyelitis. He went and saw vein and vascular on 06/04/17 he was felt to have significant reflux in the left greater saphenous vein over this is not in the area we are most concerned  about. He could be offered ablation. He was not felt to have venous reflux noted in the right lower extremity. He was felt to have some degree of lymphedema and he was felt to be a candidate for compression pumps. Finally a diagnostic arteriogram was suggested which is really what I'm most interested in. The patient has wife  wanted time to think about this, I think they were confused about interacting between venous and arterial discussions. I think it would be probably well worth going through the angiogram. Culture I did have the deeper area on the right lateral [name] showed a few Enterococcus faecalis. I would like to start her on amoxicillin which they will start today for one-week 500 3 times a day 06/17/17; the patient still has punched out areas on the right lateral [name] and right lateral malleolus. There is not a viable surface here. We have been using Santyl. He also has an area on the plantar aspect of his left first metatarsal head this also seems to be better 06/24/17; patient's angiogram as on 07/06/17; still has punched out areas on the right lateral [name] and right lateral malleolus to which we've been applying Santyl-based dressings. He also has a more superficial area on the left plantar first metatarsal head, using collagen here 07/08/17; the patient had his angiogram. This perineal artery had a 70-80% stenosis. Similarly the posterior tibial artery also was diseased. Furthermore down the posterior tibial artery in the distal segment was a short stenosis of 80%. His major vessels in his thigh and only minor irregularity. He had percutaneous angioplasty of the proximal right peroneal artery. Also angioplasty of the tibial peroneal trunk and proximal posterior tibial artery as well as the mid to distal segment of the posterior tibial artery. He handled this remarkably well. The patient arrived with a new wound on his right anterior calf which I think is the reopening of one of the original small open areas 07/15/17; the areas on his right anterior calf is new. He has a new threatened area on the right tibia more superiorly which is not open yet but makes me wonder whether this is going to reopen as well. His original wounds on the right lateral [name] and right lateral malleolus are somewhat better in terms of  surface [name], Jermaine E. ([id number]) 07/22/17; the patient has a second open area on the right anterior. This was a threatened area superiorly last week. Each time this happens she has a small wound with a nephrotic cover. The areas on the right lateral [name] and right lateral malleolus are about the same. I did not attempt debridement in any of these today continuing with Santyl. oThe area on the left second metatarsal head looks better and he is healed laceration injuries on the arm from a fall last week with only the ventral forearm wound left 08/05/17 08/05/17; 2 week follow-up. This is a patient who initially presented with a multitude of small punched out areas on the right anterior calf greater than left dorsal [name]. This was in the setting of a constitutional illness at which time he saw multiple specialists was worked up for various rheumatologic diseases including temporal arteritis. Most of the areas on the right leg and a few on the left actually closed over however he he developed 2 small deep probing areas on the right lateral [name] and right lateral malleolus. He also has an area on the left plantar first metatarsal head which is gradually been getting better. He was revascularized  by Dr. dew about 4 weeks ago. He went to see Dr. Nehemiah Massed on 07/30/17; from review the note in time to the patient there wasn't an obvious cause. He did do a shave biopsy of the right anterior leg ulcer rule out cancer or other etiologies such as some embolic. This looks like some form of vasculopathy to me although trying to explain why it so much worse on the right leg than the left is been difficult. We will await the biopsy results. Dr. Nehemiah Massed wanted to put Bactroban and this not sure what that will do for the areas that have a completely nonviable surface. I'd like to go back to [place]. 08/19/17-He is here in follow up evaluation for multiple ulcerations to the right lower extremity and right [name] and  the left planter first metatarsal head ulcer. He has had biopsy to the right lateral proximal leg; no results available for review, patient and spouse report "negative" biopsy. We will continue with current treatment plan, using ace wrap compression to the right leg as his 15-38mHg compression stockings are providing suboptimal edema control. He will follow up in two weeks Electronic Signature(s) Signed: 08/19/2017 10:06:30 AM By: CLawanda CousinsPrevious Signature: 08/19/2017 9:39:30 AM Version By: CLawanda CousinsPrevious Signature: 08/19/2017 9:36:54 AM Version By: CLawanda CousinsEntered By: CLawanda Cousinson 08/19/2017 10:06:30 [name], [name]([id number] -------------------------------------------------------------------------------- Physician Orders Details Patient Name: MJessie FootDate of Service: 08/19/2017 9:00 AM Medical Record Number: [id number] Account Number: [id number] of Birth/Sex: 107/04/1940(77 y.o. M) Treating RN: WCornell BarmanPrimary Care Provider: CCyndi BenderOther Clinician: Referring Provider: CCyndi BenderTreating Provider/Extender: CCathie Oldenin Treatment: 153Verbal / Phone Orders: No Diagnosis Coding Wound Cleansing Wound #15 Right,Proximal,Anterior Lower Leg o Clean wound with Normal Saline. Wound #16 Right,Lateral Lower Leg o Clean wound with Normal Saline. Wound #6 Right,Lateral Malleolus o Clean wound with Normal Saline. Wound #8 Right,Lateral [name] o Clean wound with Normal Saline. Wound #9 Left Metatarsal head first o Clean wound with Normal Saline. Anesthetic (add to Medication List) Wound #15 Right,Proximal,Anterior Lower Leg o Topical Lidocaine 4% cream applied to wound bed prior to debridement (In Clinic Only). Wound #16 Right,Lateral Lower Leg o Topical Lidocaine 4% cream applied to wound bed prior to debridement (In Clinic Only). Wound #6 Right,Lateral Malleolus o Topical Lidocaine 4% cream applied  to wound bed prior to debridement (In Clinic Only). Wound #8 Right,Lateral [name] o Topical Lidocaine 4% cream applied to wound bed prior to debridement (In Clinic Only). Wound #9 Left Metatarsal head first o Topical Lidocaine 4% cream applied to wound bed prior to debridement (In Clinic Only). Primary Wound Dressing Wound #15 Right,Proximal,Anterior Lower Leg o Silver Collagen Wound #16 Right,Lateral Lower Leg o Silver Collagen Wound #9 Left Metatarsal head first o Silver Collagen Wound #6 Right,Lateral Malleolus o Santyl Ointment Carne, LBRAZOS SANDOVAL([id number] Wound #8 Right,Lateral [name] o Santyl Ointment Secondary Dressing Wound #15 Right,Proximal,Anterior Lower Leg o ABD pad Wound #16 Right,Lateral Lower Leg o ABD pad Wound #6 Right,Lateral Malleolus o ABD pad Wound #8 Right,Lateral [name] o ABD pad Wound #9 Left Metatarsal head first o Boardered Foam Dressing Dressing Change Frequency Wound #15 Right,Proximal,Anterior Lower Leg o Change Dressing Monday, Wednesday, Friday Wound #16 Right,Lateral Lower Leg o Change Dressing Monday, Wednesday, Friday Wound #6 Right,Lateral Malleolus o Change Dressing Monday, Wednesday, Friday Wound #8 Right,Lateral [name] o Change Dressing Monday, Wednesday, Friday Wound #9 Left Metatarsal head first o Change Dressing Monday, Wednesday, Friday Follow-up  Appointments Wound #15 Right,Proximal,Anterior Lower Leg o Return Appointment in 2 weeks. Wound #16 Right,Lateral Lower Leg o Return Appointment in 2 weeks. Wound #6 Right,Lateral Malleolus o Return Appointment in 2 weeks. Wound #8 Right,Lateral [name] o Return Appointment in 2 weeks. Wound #9 Left Metatarsal head first o Return Appointment in 2 weeks. Edema Control Wound #15 Right,Proximal,Anterior Lower Leg o Other: - Ace Wrap [place], Owain E. ([id number]) Wound #16 Right,Lateral Lower Leg o Other: - Ace Wrap Wound #6 Right,Lateral  Malleolus o Other: - Ace Wrap Wound #8 Right,Lateral [name] o Other: - Ace [place] Wound #15 Right,Proximal,Anterior Lower Leg o [place] Nurse may visit PRN to address patientos wound care needs. o FACE TO FACE ENCOUNTER: MEDICARE and MEDICAID PATIENTS: I certify that this patient is under my care and that I had a face-to-face encounter that meets the physician face-to-face encounter requirements with this patient on this date. The encounter with the patient was in whole or in part for the following MEDICAL CONDITION: (primary reason for [place]) MEDICAL NECESSITY: I certify, that based on my findings, NURSING services are a medically necessary home health service. HOME BOUND STATUS: I certify that my clinical findings support that this patient is homebound (i.e., Due to illness or injury, pt requires aid of supportive devices such as crutches, cane, wheelchairs, walkers, the use of special transportation or the assistance of another person to leave their place of residence. There is a normal inability to leave the home and doing so requires considerable and taxing effort. Other absences are for medical reasons / religious services and are infrequent or of short duration when for other reasons). o If current dressing causes regression in wound condition, may D/C ordered dressing product/s and apply Normal Saline Moist Dressing daily until next [place] / Other MD appointment. [place] of regression in wound condition at [phone]. o Please direct any NON-WOUND related issues/requests for orders to patient's Primary Care Physician Wound #16 [place] Nurse may visit PRN to address patientos wound care needs. o FACE TO FACE ENCOUNTER: MEDICARE and MEDICAID PATIENTS: I certify that this patient is under my care and that I had a  face-to-face encounter that meets the physician face-to-face encounter requirements with this patient on this date. The encounter with the patient was in whole or in part for the following MEDICAL CONDITION: (primary reason for [place]) MEDICAL NECESSITY: I certify, that based on my findings, NURSING services are a medically necessary home health service. HOME BOUND STATUS: I certify that my clinical findings support that this patient is homebound (i.e., Due to illness or injury, pt requires aid of supportive devices such as crutches, cane, wheelchairs, walkers, the use of special transportation or the assistance of another person to leave their place of residence. There is a normal inability to leave the home and doing so requires considerable and taxing effort. Other absences are for medical reasons / religious services and are infrequent or of short duration when for other reasons). o If current dressing causes regression in wound condition, may D/C ordered dressing product/s and apply Normal Saline Moist Dressing daily until next [place] / Other MD appointment. [place] of regression in wound condition at [phone]. o Please direct any NON-WOUND related issues/requests for orders to patient's Primary Care Physician Wound #6 [place] Nurse  may visit PRN to address patientos wound care needs. o FACE TO FACE ENCOUNTER: MEDICARE and MEDICAID PATIENTS: I certify that this patient is under my care and that I had a face-to-face encounter that meets the physician face-to-face encounter requirements with this patient on this date. The encounter with the patient was in whole or in part for the following MEDICAL CONDITION: (primary reason for [place]) MEDICAL NECESSITY: I certify, that based on my findings, NURSING services are a medically necessary home health service. HOME BOUND  STATUS: I certify that my clinical findings support that this patient is homebound (i.e., Due to illness or injury, pt requires aid of supportive devices such as crutches, cane, wheelchairs, walkers, the use of special transportation or the assistance of another person to leave their place of residence. There is a normal inability to leave the home [name], Jermaine E. ([id number]) and doing so requires considerable and taxing effort. Other absences are for medical reasons / religious services and are infrequent or of short duration when for other reasons). o If current dressing causes regression in wound condition, may D/C ordered dressing product/s and apply Normal Saline Moist Dressing daily until next [place] / Other MD appointment. [place] of regression in wound condition at [phone]. o Please direct any NON-WOUND related issues/requests for orders to patient's Primary Care Physician Wound #8 [place] Nurse may visit PRN to address patientos wound care needs. o FACE TO FACE ENCOUNTER: MEDICARE and MEDICAID PATIENTS: I certify that this patient is under my care and that I had a face-to-face encounter that meets the physician face-to-face encounter requirements with this patient on this date. The encounter with the patient was in whole or in part for the following MEDICAL CONDITION: (primary reason for [place]) MEDICAL NECESSITY: I certify, that based on my findings, NURSING services are a medically necessary home health service. HOME BOUND STATUS: I certify that my clinical findings support that this patient is homebound (i.e., Due to illness or injury, pt requires aid of supportive devices such as crutches, cane, wheelchairs, walkers, the use of special transportation or the assistance of another person to leave their place of residence. There is a normal inability to leave the  home and doing so requires considerable and taxing effort. Other absences are for medical reasons / religious services and are infrequent or of short duration when for other reasons). o If current dressing causes regression in wound condition, may D/C ordered dressing product/s and apply Normal Saline Moist Dressing daily until next [place] / Other MD appointment. [place] of regression in wound condition at [phone]. o Please direct any NON-WOUND related issues/requests for orders to patient's Primary Care Physician Wound #9 Left Metatarsal head first o [place] Nurse may visit PRN to address patientos wound care needs. o FACE TO FACE ENCOUNTER: MEDICARE and MEDICAID PATIENTS: I certify that this patient is under my care and that I had a face-to-face encounter that meets the physician face-to-face encounter requirements with this patient on this date. The encounter with the patient was in whole or in part for the following MEDICAL CONDITION: (primary reason for [place]) MEDICAL NECESSITY: I certify, that based on my findings, NURSING services are a medically necessary home health service. HOME BOUND STATUS: I certify that my clinical findings support that this patient is homebound (i.e., Due to illness or injury, pt requires aid  of supportive devices such as crutches, cane, wheelchairs, walkers, the use of special transportation or the assistance of another person to leave their place of residence. There is a normal inability to leave the home and doing so requires considerable and taxing effort. Other absences are for medical reasons / religious services and are infrequent or of short duration when for other reasons). o If current dressing causes regression in wound condition, may D/C ordered dressing product/s and apply Normal Saline Moist Dressing daily until next [place] / Other MD  appointment. [place] of regression in wound condition at [phone]. o Please direct any NON-WOUND related issues/requests for orders to patient's Primary Care Physician Electronic Signature(s) Signed: 08/19/2017 5:10:54 PM By: Lawanda Cousins Signed: 08/19/2017 5:18:50 PM By: Gretta Cool, BSN, RN, CWS, Kim RN, BSN Entered By: Gretta Cool, BSN, RN, CWS, Kim on 08/19/2017 09:32:23 [name], [name] ([id number]) -------------------------------------------------------------------------------- Problem List Details Patient Name: [name] Date of Service: 08/19/2017 9:00 AM Medical Record Number: [id number] Patient Account Number: [id number] Date of Birth/Sex: 1940-01-14 (77 y.o. 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 04/08/2017 No Yes breakdown of skin L97.212 Non-pressure chronic ulcer of right calf with fat layer exposed 04/08/2017 No Yes L97.512 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 with fat 04/08/2017 No Yes layer exposed I25.5 Ischemic cardiomyopathy 04/08/2017 No Yes 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 of 07/08/2017 No Yes other part of [name] Inactive Problems Resolved Problems ICD-10 Code Description Active Date Resolved Date S41.111D Laceration without foreign body of right upper arm, subsequent 07/15/2017 07/15/2017 encounter Electronic Signature(s) Signed: 08/19/2017 9:34:23 AM By: Lawanda Cousins Entered By: Lawanda Cousins on 08/19/2017 09:34:23 [name], [name] ([id number]) -------------------------------------------------------------------------------- Progress Note Details Patient Name: [name] Date of  Service: 08/19/2017 9:00 AM Medical Record Number: [id number] Patient Account Number: [id number] Date of Birth/Sex: April 01, 1940 (77 y.o. M) Treating RN: Cornell Barman Primary Care Provider: Cyndi Bender Other Clinician: Referring Provider: Cyndi Bender Treating Provider/Extender: Cathie Olden in Treatment: 72 Subjective Chief Complaint Information obtained from Patient RLE and left [name] wounds History of Present Illness (HPI) 04/08/17; this is a complex 77 year old man referred here from [facility]. He had been referred there for bilateral lower extremity edema with ulcer formation predominantly on the right calf but also the right [name]. He had been receiving Unna boots bilaterally. The history here is long. He is not a diabetic however ICU looking through late 2018 he was worked up for chronic headaches, elevated inflammatory markers including C-reactive protein and ESR. He went on to actually have a left temporal artery biopsy wasn't that was negative.he received about 6 weeks of high-dose prednisone 60 mg with improvement in his inflammatory markers. He was admitted to hospital in late November with ventricular tachycardia syncope. He has known ischemic cardiomyopathy. He was admitted in the hospital in mid December. Apparently this was precipitated by a syncopal spell falling out of his sc[facility] [place] [facility]. There was ventricular arrhythmia. He has an implantable defibrillator and echocardiogram showed severe LV dysfunction with an EF of 20% and valvular regurgitations including mild AR, moderate MR. There was no stenosis. He ruled in for a non-ST elevation MI in the setting of V. tach.his wife  states that sometime during this hospitalization she noted multiple areas of skin change on the right lower calf which became evident just after he left the hospital. He was back in hospital in February with acute renal failure hyponatremia. This responded to fluid  resuscitation.. Interestingly I can't see much description of his right leg at that point in time.he was followed by Dr. Nehemiah Massed of dermatology for the necrotic wounds on his right leg. Apparently a biopsy was planned at one point but not done although in some notes that suggests it was. I cannot see these results area He was noted to have a lot of edema. Was treated with bilateral Unna boots edges really helped with the swelling they have been using Bactroban to small open areas predominantly on the right anterior lower leg His history is complicated by the fact that he has rheumatoid arthritis followed by rheumatology. He is followed by neurology for disabling headaches. At one point this was felt to be giant cell arteritis although a left temporal biopsy was apparently negative. He was given a prolonged course of prednisone at 60 mg which managed his sedimentation rates but apparently did not prove improve the headaches. This is been tapered to off on by rheumatology on 03/13/17 Vascular had plans to do a venous reflux workup as well as arterial studies in May. They also wanted to get him a lymphedema pump. As mentioned he's been using bacitracin under Unna boot wraps to both lower legs 04/15/17; the patient arrives with most of his wounds improved. These are small punched out wounds. Most of them remaining ones are on the right anterior calf with the most problematic over the right lateral malleolus. There are no new areas. The symptom complex or potential symptom complex we are dealing with his chronic disabling headaches with inflammatory markers not responsive to prednisone and with a negative temporal biopsy, lower extremity weakness, skin ulcerations just on the right leg. We have managed to get his arterial studies moved up to April 30. He has a rheumatology consult at [facility] in June. He has seen dermatology locally, rheumatology locally, neurology locally. 04/22/17; small punched out areas on  the right leg anteriorly posteriorly. Most of these appear to have closed over. Some of them have eschar over the surface. The most problematic area appears to be over the right lateral malleolus. We've been using prisma to all of this. He has arterial studies on April 30 and a rheumatology consult at [facility] on June 28 04/29/17; most of the small punched out areas on the right leg posteriorly are closed. He has 2 or 3 openings anteriorly but Twig[place], Daeshon E. ([id number]) most of these appear to be on the way to closing. Still problematically over the right lateral malleolus and right lateral [name] with almost ischemic-looking eschar. His arterial studies that I ordered are due to be done next week on the 30th so we should have them available for our next visit hopefully. He also saw a rheumatologist at [facility] and according to the patient he did 8 vials of blood. Finally he has scaling rash on his left [name] and what looks to be at [facility] area on the left anterior leg 05/06/17; most of the small punched out areas on the right leg posteriorly and anteriorly are closed. He still has one small one anteriorly one over the right lateral malleolus and one over the right lateral [name]. He had his arterial studies they didn't seem to do waveform analysis not exactly sure why  however in any case is ABIs were noncompressible bilaterally. They did provide TBIs although looking at the pressures it appears that his TBIs are quite normal. I therefore went ahead and debrided the area over the right lateral malleolus and the right lateral [name] 05/13/17; most of the small punched out areas on the right leg posteriorly and anteriorly are closed. He continues to have problematic areas over the right lateral malleolus and the right lateral [name]. He still requiring aggressive debridement of these 2 wounds using silver collagen I reviewed the note from rheumatology I don't think they came up with a specific diagnosis although he  is known to have seropositive RA. They did a panel of lab work when I was able to see his his AMA was negative, anti-smooth muscle antibodies negative antineutrophil cytoplasmic antibodies negative,Liv/kia type 1 negative. Serum C3 and C4 were negative. I don't see his muscle enzymes specifically. He was referred back to his local rheumatologist for management of his known rheumatoid arthritis.the patient states he still feels weak and fatigued. He states he has numbness in both feet and apparently is known to have neuropathy 05/20/17; the patient continues to have a difficult problem on the right lateral malleolus and the right lateral [name]. Both of these wounds have no viable surface even with attempts at debridement. He has a new wound on the left plantar metatarsal head which looks more like a superficial diabetic pressure related injury then part of this underlying issue he has. Most of the rest of the wounds on his legs look satisfactory. Mostly on the right calf.reviewed his arterial studies which showed noncompressible vessels bilaterally but the TBIs were quite normal. 05/27/17; no real improvement in the right lateral malleolus and right lateral [name]. In fact the right lateral [name] is now on bone. I gave him doxycycline empirically last week it appears that he developed photosensitivity was 77 the son in a tractor. I'll not give him any more of this. This is predominantly on his face and dorsal forearms and hands. I given this more as an anti- inflammatory. I'm going to have him seen by vascular surgery. His TBIs that he had done previously ordered by Dr. Brigitte Pulse were in the normal range They never did full arterial studies on him. he now has small punched out wounds on the right lateral ankle and right lateral [name]. I doubt these are ischemic however I would like a review of his macrovascular status. He does describe some pain at night. I'll reduce the compression from 3 layer to 2 layer and  I'm not convinced that this is a macrovascular issue however I want to make sure. We've been using silver alginate 06/03/17; the patient's x-rays that I ordered last time of his right ankle and right lateral [name] did not show definite osteomyelitis. We've been using silver alginate. The wounds are not making any progress. The area on the plantar left first metatarsal head however appears to be better. The patient complains of weakness that is more of the fatigue. He says if he's walking his head will fall onto his chest and that his legs literally gave out on him. He did see neurology in the past however that was at a time where his workup was for temporal arteritis and headaches. 06/10/17; the patient is making no progress with the areas on the right lateral [name] and right lateral malleolus. In fact the area on the [name] probes to bone. X-ray did not show osteomyelitis. He went and saw vein  and vascular on 06/04/17 he was felt to have significant reflux in the left greater saphenous vein over this is not in the area we are most concerned about. He could be offered ablation. He was not felt to have venous reflux noted in the right lower extremity. He was felt to have some degree of lymphedema and he was felt to be a candidate for compression pumps. Finally a diagnostic arteriogram was suggested which is really what I'm most interested in. The patient has wife wanted time to think about this, I think they were confused about interacting between venous and arterial discussions. I think it would be probably well worth going through the angiogram. Culture I did have the deeper area on the right lateral [name] showed a few Enterococcus faecalis. I would like to start her on amoxicillin which they will start today for one-week 500 3 times a day 06/17/17; the patient still has punched out areas on the right lateral [name] and right lateral malleolus. There is not a viable surface here. We have been using Santyl. He  also has an area on the plantar aspect of his left first metatarsal head this also seems to be better 06/24/17; patient's angiogram as on 07/06/17; still has punched out areas on the right lateral [name] and right lateral malleolus to which we've been applying Santyl-based dressings. He also has a more superficial area on the left plantar first metatarsal head, using collagen here 07/08/17; the patient had his angiogram. This perineal artery had a 70-80% stenosis. Similarly the posterior tibial artery also was diseased. Furthermore down the posterior tibial artery in the distal segment was a short stenosis of 80%. His major vessels in his thigh and only minor irregularity. He had percutaneous angioplasty of the proximal right peroneal artery. Also angioplasty of the tibial peroneal trunk and proximal posterior tibial artery as well as the mid to distal segment of the posterior tibial artery. He handled this remarkably well. KRZYSZTOF, REICHELT ([id number]) The patient arrived with a new wound on his right anterior calf which I think is the reopening of one of the original small open areas 07/15/17; the areas on his right anterior calf is new. He has a new threatened area on the right tibia more superiorly which is not open yet but makes me wonder whether this is going to reopen as well. His original wounds on the right lateral [name] and right lateral malleolus are somewhat better in terms of surface 07/22/17; the patient has a second open area on the right anterior. This was a threatened area superiorly last week. Each time this happens she has a small wound with a nephrotic cover. The areas on the right lateral [name] and right lateral malleolus are about the same. I did not attempt debridement in any of these today continuing with Santyl. The area on the left second metatarsal head looks better and he is healed laceration injuries on the arm from a fall last week with only the ventral forearm wound  left 08/05/17 08/05/17; 2 week follow-up. This is a patient who initially presented with a multitude of small punched out areas on the right anterior calf greater than left dorsal [name]. This was in the setting of a constitutional illness at which time he saw multiple specialists was worked up for various rheumatologic diseases including temporal arteritis. Most of the areas on the right leg and a few on the left actually closed over however he he developed 2 small deep probing areas on the  right lateral [name] and right lateral malleolus. He also has an area on the left plantar first metatarsal head which is gradually been getting better. He was revascularized by Dr. dew about 4 weeks ago. He went to see Dr. Nehemiah Massed on 07/30/17; from review the note in time to the patient there wasn't an obvious cause. He did do a shave biopsy of the right anterior leg ulcer rule out cancer or other etiologies such as some embolic. This looks like some form of vasculopathy to me although trying to explain why it so much worse on the right leg than the left is been difficult. We will await the biopsy results. Dr. Nehemiah Massed wanted to put Bactroban and this not sure what that will do for the areas that have a completely nonviable surface. I'd like to go back to [place]. 08/19/17-He is here in follow up evaluation for multiple ulcerations to the right lower extremity and right [name] and the left planter first metatarsal head ulcer. He has had biopsy to the right lateral proximal leg; no results available for review, patient and spouse report "negative" biopsy. We will continue with current treatment plan, using ace wrap compression to the right leg as his 15-20mHg compression stockings are providing suboptimal edema control. He will follow up in two weeks Objective Constitutional Vitals Time Taken: 8:53 AM, Height: 68 in, Temperature: 97.6 F, Pulse: 70 bpm, Respiratory Rate: 16 breaths/min, Blood Pressure: 93/46  mmHg. General Notes: Made Ziyana Morikawa aware of BP. Integumentary (Hair, Skin) Wound #15 status is Open. Original cause of wound was Gradually Appeared. The wound is located on the Right,Proximal,Anterior Lower Leg. The wound measures 0.9cm length x 0.8cm width x 0.1cm depth; 0.565cm^2 area and 0.057cm^3 volume. There is no tunneling or undermining noted. There is a medium amount of serosanguineous drainage noted. The wound margin is distinct with the outline attached to the wound base. There is small (1-33%) red, pink granulation within the wound bed. There is a large (67-100%) amount of necrotic tissue within the wound bed including Eschar and Adherent Slough. The periwound skin appearance exhibited: Erythema. The surrounding wound skin color is noted with erythema which is circumferential. Periwound temperature was noted as No Abnormality. The periwound has tenderness on palpation. [name], [name]([id number] Wound #16 status is Open. Original cause of wound was Gradually Appeared. The wound is located on the Right,Lateral Lower Leg. The wound measures 0.9cm length x 0.7cm width x 0.1cm depth; 0.495cm^2 area and 0.049cm^3 volume. There is no tunneling or undermining noted. There is a medium amount of serosanguineous drainage noted. The wound margin is distinct with the outline attached to the wound base. There is no granulation within the wound bed. There is a large (67-100%) amount of necrotic tissue within the wound bed including Eschar and Adherent Slough. The periwound skin appearance did not exhibit: Maceration. Periwound temperature was noted as No Abnormality. The periwound has tenderness on palpation. Wound #6 status is Open. Original cause of wound was Gradually Appeared. The wound is located on the Right,Lateral Malleolus. The wound measures 0.4cm length x 0.3cm width x 0.3cm depth; 0.094cm^2 area and 0.028cm^3 volume. There is no tunneling or undermining noted. There is a large amount of  serous drainage noted. The wound margin is distinct with the outline attached to the wound base. There is no granulation within the wound bed. There is a large (67-100%) amount of necrotic tissue within the wound bed including Adherent Slough. The periwound skin appearance exhibited: Callus, Erythema. The periwound skin  appearance did not exhibit: Crepitus, Excoriation, Induration, Rash, Scarring, Dry/Scaly, Maceration, Atrophie Blanche, Cyanosis, Ecchymosis, Hemosiderin Staining, Mottled, Pallor, Rubor. The surrounding wound skin color is noted with erythema which is circumferential. Periwound temperature was noted as No Abnormality. The periwound has tenderness on palpation. Wound #8 status is Open. Original cause of wound was Gradually Appeared. The wound is located on the Right,Lateral [name]. The wound measures 0.7cm length x 0.7cm width x 0.4cm depth; 0.385cm^2 area and 0.154cm^3 volume. There is bone and Fat Layer (Subcutaneous Tissue) Exposed exposed. There is no tunneling or undermining noted. There is a large amount of serous drainage noted. The wound margin is distinct with the outline attached to the wound base. There is no granulation within the wound bed. There is a large (67-100%) amount of necrotic tissue within the wound bed including Adherent Slough. The periwound skin appearance exhibited: Callus, Maceration, Erythema. The surrounding wound skin color is noted with erythema which is circumferential. Periwound temperature was noted as No Abnormality. The periwound has tenderness on palpation. Wound #9 status is Open. Original cause of wound was Gradually Appeared. The wound is located on the Left Metatarsal head first. The wound measures 0.3cm length x 0.2cm width x 0.1cm depth; 0.047cm^2 area and 0.005cm^3 volume. There is no tunneling or undermining noted. There is a large amount of serous drainage noted. The wound margin is flat and intact. There is large (67-100%) red  granulation within the wound bed. There is a small (1-33%) amount of necrotic tissue within the wound bed including Adherent Slough. The periwound skin appearance exhibited: Callus, Maceration. The periwound skin appearance did not exhibit: Crepitus, Excoriation, Induration, Rash, Scarring, Dry/Scaly, Atrophie Blanche, Cyanosis, Ecchymosis, Hemosiderin Staining, Mottled, Pallor, Rubor, Erythema. Periwound temperature was noted as No Abnormality. 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 ([id number]) Tissue Pre Debridement is: Fat layer exposed. There was a Excisional Skin/Subcutaneous Tissue Debridement with a total area of 0.72 sq cm performed by Lawanda Cousins, NP. With the following instrument(s): Curette to remove Viable and Non- Viable tissue/material. Material removed includes Subcutaneous Tissue and Slough and after achieving pain control using Other (lidocaine 4%). No specimens were taken. A time out was conducted at 09:22, prior to the start of the procedure. A Minimum amount of bleeding was controlled with Pressure. The procedure was tolerated well with a pain level of 0 throughout and a pain level of 1 following the procedure. Patient s Level of Consciousness post procedure was recorded as Awake and Alert. Post Debridement Measurements: 0.9cm length x 0.8cm width x 0.2cm depth; 0.113cm^3 volume. Character of Wound/Ulcer Post Debridement is stable. Severity of Tissue Post Debridement is: Fat layer  exposed. Post procedure Diagnosis Wound #15: Same as Pre-Procedure Plan Wound Cleansing: Wound #15 Right,Proximal,Anterior Lower Leg: Clean wound with Normal Saline. Wound #16 Right,Lateral Lower Leg: Clean wound with Normal Saline. Wound #6 Right,Lateral Malleolus: Clean wound with Normal Saline. Wound #8 Right,Lateral [name]: Clean wound with Normal Saline. Wound #9 Left Metatarsal head first: Clean wound with Normal Saline. Anesthetic (add to Medication List): Wound #15 Right,Proximal,Anterior Lower Leg:  Topical Lidocaine 4% cream applied to wound bed prior to debridement (In Clinic Only). Wound #16 Right,Lateral Lower Leg: Topical Lidocaine 4% cream applied to wound bed prior to debridement (In Clinic Only). Wound #6 Right,Lateral Malleolus: Topical Lidocaine 4% cream applied to wound bed prior to debridement (In Clinic Only). Wound #8 Right,Lateral [name]: Topical Lidocaine 4% cream applied to wound bed prior to debridement (In Clinic Only). Wound #9 Left Metatarsal head first: Topical Lidocaine 4% cream applied to wound bed prior to debridement (In Clinic Only). Primary Wound Dressing: Wound #15 Right,Proximal,Anterior Lower Leg: Silver Collagen Wound #16 Right,Lateral Lower Leg: Silver Collagen Wound #9 Left Metatarsal head first: Silver Collagen Wound #6 Right,Lateral Malleolus: Santyl Ointment Wound #8 Right,Lateral [name]: Santyl Ointment Secondary Dressing: Wound #15 Right,Proximal,Anterior Lower Leg: ABD pad Wound #16 Right,Lateral Lower Leg: ABD pad Wound #6 Right,Lateral Malleolus: ABD pad Wound #8 Right,Lateral [name]: ABD pad [name], Jermaine E. ([id number]) Wound #9 Left Metatarsal head first: Boardered Foam Dressing Dressing Change Frequency: Wound #15 Right,Proximal,Anterior Lower Leg: Change Dressing Monday, Wednesday, Friday Wound #16 Right,Lateral Lower Leg: Change Dressing Monday, Wednesday, Friday Wound #6 Right,Lateral Malleolus: Change Dressing  Monday, Wednesday, Friday Wound #8 Right,Lateral [name]: Change Dressing Monday, Wednesday, Friday Wound #9 Left Metatarsal head first: Change Dressing Monday, Wednesday, Friday Follow-up Appointments: Wound #15 Right,Proximal,Anterior Lower Leg: Return Appointment in 2 weeks. Wound #16 Right,Lateral Lower Leg: Return Appointment in 2 weeks. Wound #6 Right,Lateral Malleolus: Return Appointment in 2 weeks. Wound #8 Right,Lateral [name]: Return Appointment in 2 weeks. Wound #9 Left Metatarsal head first: Return Appointment in 2 weeks. Edema Control: Wound #15 Right,Proximal,Anterior Lower Leg: Other: - Ace Wrap Wound #16 Right,Lateral Lower Leg: Other: - Ace Wrap Wound #6 Right,Lateral Malleolus: Other: - Ace Wrap Wound #8 Right,Lateral [name]: Other: - Ace Wrap Home Health: Wound #15 Right,Proximal,Anterior Lower Leg: [place] Nurse may visit PRN to address patient s wound care needs. FACE TO FACE ENCOUNTER: MEDICARE and MEDICAID PATIENTS: I certify that this patient is under my care and that I had a face-to-face encounter that meets the physician face-to-face encounter requirements with this patient on this date. The encounter with the patient was in whole or in part for the following MEDICAL CONDITION: (primary reason for [place]) MEDICAL NECESSITY: I certify, that based on my findings, NURSING services are a medically necessary home health service. HOME BOUND STATUS: I certify that my clinical findings support that this patient is homebound (i.e., Due to illness or injury, pt requires aid of supportive devices such as crutches, cane, wheelchairs, walkers, the use of special transportation or the assistance of another person to leave their place of residence. There is a normal inability to leave the home and doing so requires considerable and taxing effort. Other absences are for medical reasons / religious services and are infrequent or of short  duration when for other reasons). If current dressing causes regression in wound condition, may D/C ordered dressing product/s and apply Normal Saline Moist Dressing daily until next [place] / Other MD appointment. [place] of regression in wound condition at [phone]. Please direct any NON-WOUND related issues/requests for orders to patient's Primary Care Physician Wound #16 Right,Lateral Lower Leg: [place] Nurse may visit PRN to address patient s wound care needs. FACE TO FACE ENCOUNTER: MEDICARE and MEDICAID PATIENTS: I certify that this patient is under my care and that I had a face-to-face encounter that meets the physician face-to-face encounter  requirements with this patient on this date. The encounter with the patient was in whole or in part for the following MEDICAL CONDITION: (primary reason for [place]) MEDICAL NECESSITY: I certify, that based on my findings, NURSING services are a medically necessary home health service. HOME BOUND STATUS: I certify that my clinical findings support that this patient is homebound (i.e., Due to illness or injury, pt requires aid of supportive devices such as crutches, cane, wheelchairs, walkers, the use of special transportation or the assistance of another person to leave their place of residence. There is a normal inability to leave the Jablonowski, ARBIE BLANKLEY. ([id number]) home and doing so requires considerable and taxing effort. Other absences are for medical reasons / religious services and are infrequent or of short duration when for other reasons). If current dressing causes regression in wound condition, may D/C ordered dressing product/s and apply Normal Saline Moist Dressing daily until next [place] / Other MD appointment. [place] of regression in wound condition at [phone]. Please direct any NON-WOUND related issues/requests for  orders to patient's Primary Care Physician Wound #6 Right,Lateral Malleolus: [place] Nurse may visit PRN to address patient s wound care needs. FACE TO FACE ENCOUNTER: MEDICARE and MEDICAID PATIENTS: I certify that this patient is under my care and that I had a face-to-face encounter that meets the physician face-to-face encounter requirements with this patient on this date. The encounter with the patient was in whole or in part for the following MEDICAL CONDITION: (primary reason for [place]) MEDICAL NECESSITY: I certify, that based on my findings, NURSING services are a medically necessary home health service. HOME BOUND STATUS: I certify that my clinical findings support that this patient is homebound (i.e., Due to illness or injury, pt requires aid of supportive devices such as crutches, cane, wheelchairs, walkers, the use of special transportation or the assistance of another person to leave their place of residence. There is a normal inability to leave the home and doing so requires considerable and taxing effort. Other absences are for medical reasons / religious services and are infrequent or of short duration when for other reasons). If current dressing causes regression in wound condition, may D/C ordered dressing product/s and apply Normal Saline Moist Dressing daily until next [place] / Other MD appointment. [place] of regression in wound condition at [phone]. Please direct any NON-WOUND related issues/requests for orders to patient's Primary Care Physician Wound #8 Right,Lateral [name]: [place] Nurse may visit PRN to address patient s wound care needs. FACE TO FACE ENCOUNTER: MEDICARE and MEDICAID PATIENTS: I certify that this patient is under my care and that I had a face-to-face encounter that meets the physician face-to-face encounter requirements with this patient on this  date. The encounter with the patient was in whole or in part for the following MEDICAL CONDITION: (primary reason for [place]) MEDICAL NECESSITY: I certify, that based on my findings, NURSING services are a medically necessary home health service. HOME BOUND STATUS: I certify that my clinical findings support that this patient is homebound (i.e., Due to illness or injury, pt requires aid of supportive devices such as crutches, cane, wheelchairs, walkers, the use of special transportation or the assistance of another person to leave their place of residence. There is a normal inability to leave the home and doing so requires considerable and taxing effort. Other absences are for medical reasons /  religious services and are infrequent or of short duration when for other reasons). If current dressing causes regression in wound condition, may D/C ordered dressing product/s and apply Normal Saline Moist Dressing daily until next [place] / Other MD appointment. [place] of regression in wound condition at [phone]. Please direct any NON-WOUND related issues/requests for orders to patient's Primary Care Physician Wound #9 Left Metatarsal head first: [place] Nurse may visit PRN to address patient s wound care needs. FACE TO FACE ENCOUNTER: MEDICARE and MEDICAID PATIENTS: I certify that this patient is under my care and that I had a face-to-face encounter that meets the physician face-to-face encounter requirements with this patient on this date. The encounter with the patient was in whole or in part for the following MEDICAL CONDITION: (primary reason for [place]) MEDICAL NECESSITY: I certify, that based on my findings, NURSING services are a medically necessary home health service. HOME BOUND STATUS: I certify that my clinical findings support that this patient is homebound (i.e., Due to illness or injury, pt requires  aid of supportive devices such as crutches, cane, wheelchairs, walkers, the use of special transportation or the assistance of another person to leave their place of residence. There is a normal inability to leave the home and doing so requires considerable and taxing effort. Other absences are for medical reasons / religious services and are infrequent or of short duration when for other reasons). If current dressing causes regression in wound condition, may D/C ordered dressing product/s and apply Normal Saline Moist Dressing daily until next [place] / Other MD appointment. [place] of regression in wound condition at [phone]. Please direct any NON-WOUND related issues/requests for orders to patient's Primary Care Physician Electronic Signature(s) TAKUMA, CIFELLI ([id number]) Signed: 08/19/2017 10:08:56 AM By: Lawanda Cousins Entered By: Lawanda Cousins on 08/19/2017 10:08:56 [place]on, [name] ([id number]) -------------------------------------------------------------------------------- SuperBill Details Patient Name: [name] Date of Service: 08/19/2017 Medical Record Number: [id number] Patient Account Number: [id number] Date of Birth/Sex: 11-14-1940 (77 y.o. 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

## 2017-09-03 NOTE — Progress Notes (Signed)
[name], [name] ([id number]) Visit Report for 08/19/2017 Arrival Information Details Patient Name: [name], [name] Date of Service: 08/19/2017 9:00 AM Medical Record Number: [id number] Patient Account Number: [id number] Date of Birth/Sex: 25-Sep-1940 (76 y.o. M) Treating RN: Ahmed Prima Primary Care Shalaya Swailes: Cyndi Bender Other Clinician: Referring Joshia Kitchings: Cyndi Bender Treating Aseel Truxillo/Extender: Cathie Olden in Treatment: 43 Visit Information History Since Last Visit All ordered tests and consults were completed: No Patient Arrived: Cane Added or deleted any medications: No Arrival Time: 08:52 Any new allergies or adverse reactions: No Accompanied By: wife Had a fall or experienced change in No Transfer Assistance: None activities of daily living that may affect Patient Identification Verified: Yes risk of falls: Secondary Verification Process Completed: Yes Signs or symptoms of abuse/neglect since last visito No Patient Requires Transmission-Based No Hospitalized since last visit: No Precautions: Implantable device outside of the clinic excluding No Patient Has Alerts: Yes cellular tissue based products placed in the center Patient Alerts: R ABI since last visit: >[address] as Prescribed: Yes L ABI Has Compression in Place as Prescribed: Yes >220 Pain Present Now: No Electronic Signature(s) Signed: 08/24/2017 5:34:19 PM By: Alric Quan Entered By: Alric Quan on 08/19/2017 08:53:09 Norlander, [name] ([id number]) -------------------------------------------------------------------------------- Encounter Discharge Information Details Patient Name: [name] Date of Service: 08/19/2017 9:00 AM Medical Record Number: [id number] Patient Account Number: [id number] Date of Birth/Sex: 05/18/1940 (76 y.o. M) Treating RN: Cornell Barman Primary Care Maurita Havener: Cyndi Bender Other Clinician: Referring Lin Glazier: Cyndi Bender Treating Montrez Marietta/Extender: Cathie Olden in Treatment: 39 Encounter Discharge Information Items Discharge Condition: Stable Ambulatory Status: Cane Discharge Destination: Home Transportation: Private Auto Schedule Follow-up Appointment: Yes Clinical Summary of Care: Electronic Signature(s) Signed: 08/19/2017 5:18:50 PM By: Gretta Cool, BSN, RN, CWS, Kim RN, BSN Entered By: Gretta Cool, BSN, RN, CWS, Kim on 08/19/2017 09:52:03 Serres, [name] ([id number]) -------------------------------------------------------------------------------- Lower Extremity Assessment Details Patient Name: [name] Date of Service: 08/19/2017 9:00 AM Medical Record Number: [id number] Patient Account Number: [id number] Date of Birth/Sex: April 20, 1940 (76 y.o. M) Treating RN: Ahmed Prima Primary Care Adelise Buswell: Cyndi Bender Other Clinician: Referring Latrena Benegas: Cyndi Bender Treating Jodey Burbano/Extender: Cathie Olden in Treatment: 19 Edema Assessment Assessed: [Left: No] [Right: No] [Left: Edema] [Right: :] Calf Left: Right: Point of Measurement: 27 cm From Medial Instep cm 27.4 cm Ankle Left: Right: Point of Measurement: 12 cm From Medial Instep cm 22.5 cm Vascular Assessment Pulses: Dorsalis Pedis Palpable: [Left:Yes] [Right:Yes] Posterior Tibial Extremity colors, hair growth, and conditions: Extremity Color: [Left:Normal] [Right:Hyperpigmented] Temperature of Extremity: [Left:Warm] [Right:Warm] Capillary Refill: [Left:< 3 seconds] [Right:> 3 seconds] Toe Nail Assessment Left: Right: Thick: Yes Yes Discolored: Yes Yes Deformed: Yes Yes Improper Length and Hygiene: Yes Yes Electronic Signature(s) Signed: 08/24/2017 5:34:19 PM By: Alric Quan Entered By: Alric Quan on 08/19/2017 09:02:37 Pettey, [name] ([id number]) -------------------------------------------------------------------------------- Multi Wound Chart Details Patient Name: [name] Date of Service: 08/19/2017 9:00 AM Medical Record Number: [id number] Patient Account Number: [id number] Date of Birth/Sex: 07-Aug-1940 (76 y.o. M) Treating RN: Cornell Barman Primary Care Aeliana Spates: Cyndi Bender Other Clinician: Referring Hafiz Irion: Cyndi Bender Treating Tauni Sanks/Extender: Cathie Olden in Treatment: 19 Vital Signs Height(in): 68 Pulse(bpm): 70 Weight(lbs): Blood Pressure(mmHg): 93/46 Body Mass Index(BMI): Temperature(F): 97.6 Respiratory Rate 16 (breaths/min): Photos: [15:No Photos] [16:No Photos] Wound Location: [15:Right Lower Leg - Anterior, Proximal] [16:Right Lower Leg - Lateral] Wounding Event: [15:Gradually Appeared] [16:Gradually Appeared] Primary Etiology: [15:Venous Leg Ulcer] [16:Venous Leg Ulcer] Comorbid History: [15:Congestive Heart Failure, Coronary Artery  Disease, Hypertension, Myocardial Infarction, Osteoarthritis] [16:Congestive Heart Failure, Coronary Artery Disease, Hypertension, Myocardial Infarction, Osteoarthritis] Date Acquired: [15:07/22/2017] [16:08/05/2017] Weeks of Treatment: [15:4] [16:2] Wound Status: [15:Open] [16:Open] Measurements L x W x D [15:0.9x0.8x0.1] [16:0.9x0.7x0.1] (cm) Area (cm) : [15:0.565] [16:0.495] Volume (cm) : [15:0.057] [16:0.049] % Reduction in Area: [15:-79.90%] [16:-163.30%] % Reduction in Volume: [15:-83.90%] [16:-157.90%] Classification: [15:Full Thickness Without Exposed Support Structures] [16:Full Thickness Without Exposed Support Structures] Exudate Amount: [15:Medium] [16:Medium] Exudate Type: [15:Serosanguineous] [16:Serosanguineous] Exudate Color: [15:red, brown] [16:red, brown] Wound Margin: [15:Distinct, outline attached] [16:Distinct, outline attached] Granulation Amount: [15:Small (1-33%)] [16:None Present (0%)] Granulation Quality: [15:Red, Pink] [16:N/A] Necrotic Amount: [15:Large (67-100%)] [16:Large (67-100%)] Necrotic Tissue: [15:Eschar, Adherent Slough] [16:Eschar, Adherent  Slough] Exposed Structures: [15:Fascia: No Fat Layer (Subcutaneous Tissue) Exposed: No Tendon: No Muscle: No Joint: No Bone: No] [16:Fascia: No Fat Layer (Subcutaneous Tissue) Exposed: No Tendon: No Muscle: No Joint: No Bone: No] Epithelialization: [15:None] [16:None] Debridement: [15:Debridement - Excisional] [16:N/A] Pre-procedure 09:22 N/A N/A Verification/Time Out Taken: Pain Control: Other N/A N/A Tissue Debrided: Subcutaneous, Slough N/A N/A Level: Skin/Subcutaneous Tissue N/A N/A Debridement Area (sq cm): 0.72 N/A N/A Instrument: Curette N/A N/A Bleeding: Minimum N/A N/A Hemostasis Achieved: Pressure N/A N/A Procedural Pain: 0 N/A N/A Post Procedural Pain: 1 N/A N/A Debridement Treatment Procedure was tolerated well N/A N/A Response: Post Debridement 0.9x0.8x0.2 N/A N/A Measurements L x W x D (cm) Post Debridement Volume: 0.113 N/A N/A (cm) Periwound Skin Texture: No Abnormalities Noted No Abnormalities Noted Callus: Yes Excoriation: No Induration: No Crepitus: No Rash: No Scarring: No Periwound Skin Moisture: No Abnormalities Noted Maceration: No Maceration: No Dry/Scaly: No Periwound Skin Color: Erythema: Yes No Abnormalities Noted Erythema: Yes Atrophie Blanche: No Cyanosis: No Ecchymosis: No Hemosiderin Staining: No Mottled: No Pallor: No Rubor: No Erythema Location: Circumferential N/A Circumferential Temperature: No Abnormality No Abnormality No Abnormality Tenderness on Palpation: Yes Yes Yes Wound Preparation: Ulcer Cleansing: Ulcer Cleansing: Ulcer Cleansing: Other: soap Rinsed/Irrigated with Saline, Rinsed/Irrigated with Saline, and water Other: soap and water Other: soap and water Topical Anesthetic Applied: Topical Anesthetic Applied: Topical Anesthetic Applied: Other: lidocaine 4% Other: lidocaine 4% Other: lidocaine 4% Procedures Performed: Debridement N/A N/A Wound Number: [id number] N/A Photos: No Photos No Photos N/A Wound Location:  Right [name] - Lateral Left Metatarsal head first N/A Wounding Event: Gradually Appeared Gradually Appeared N/A Primary Etiology: Arterial Insufficiency Ulcer Pressure Ulcer N/A Comorbid History: Congestive Heart Failure, Congestive Heart Failure, N/A Coronary Artery Disease, Coronary Artery Disease, Hypertension, Myocardial Hypertension, Myocardial Infarction, Osteoarthritis Infarction, Osteoarthritis Date Acquired: 04/29/2017 05/06/2017 N/A Weeks of Treatment: 16 13 N/A Wound Status: Open Open N/A 0.7x0.7x0.4 0.3x0.2x0.1 N/A GEDDY, BOYDSTUN ([id number]) Measurements L x W x D (cm) Area (cm) : 0.385 0.047 N/A Volume (cm) : 0.154 0.005 N/A % Reduction in Area: -442.30% 76.00% N/A % Reduction in Volume: -2100.00% 75.00% N/A Classification: Full Thickness With Exposed Category/Stage II N/A Support Structures Exudate Amount: Large Large N/A Exudate Type: Serous Serous N/A Exudate Color: amber amber N/A Wound Margin: Distinct, outline attached Flat and Intact N/A Granulation Amount: None Present (0%) Large (67-100%) N/A Granulation Quality: N/A Red N/A Necrotic Amount: Large (67-100%) Small (1-33%) N/A Necrotic Tissue: Adherent Slough Adherent Slough N/A Exposed Structures: Fat Layer (Subcutaneous Fascia: No N/A Tissue) Exposed: Yes Fat Layer (Subcutaneous Bone: Yes Tissue) Exposed: No Fascia: No Tendon: No Tendon: No Muscle: No Muscle: No Joint: No Joint: No Bone: No Epithelialization: None None N/A Debridement: N/A N/A N/A Pain Control: N/A N/A N/A Tissue Debrided: N/A  N/A N/A Level: N/A N/A N/A Debridement Area (sq cm): N/A N/A N/A Instrument: N/A N/A N/A Bleeding: N/A N/A N/A Hemostasis Achieved: N/A N/A N/A Procedural Pain: N/A N/A N/A Post Procedural Pain: N/A N/A N/A Debridement Treatment N/A N/A N/A Response: Post Debridement N/A N/A N/A Measurements L x W x D (cm) Post Debridement Volume: N/A N/A N/A (cm) Periwound Skin Texture: Callus: Yes Callus:  Yes N/A Excoriation: No Induration: No Crepitus: No Rash: No Scarring: No Periwound Skin Moisture: Maceration: Yes Maceration: Yes N/A Dry/Scaly: No Periwound Skin Color: Erythema: Yes Atrophie Blanche: No N/A Cyanosis: No Ecchymosis: No Erythema: No Hemosiderin Staining: No Mottled: No Pallor: No Rubor: No Eichhorst, Gagan E. ([id number]) Erythema Location: Circumferential N/A N/A Temperature: No Abnormality No Abnormality N/A Tenderness on Palpation: Yes No N/A Wound Preparation: Ulcer Cleansing: Other: soap Ulcer Cleansing: N/A and water Rinsed/Irrigated with Saline, Other: soap and water Topical Anesthetic Applied: Other: lidocaine 4% Topical Anesthetic Applied: Other: lidocaine 4% Procedures Performed: N/A N/A N/A Treatment Notes Electronic Signature(s) Signed: 08/19/2017 9:34:31 AM By: Lawanda Cousins Entered By: Lawanda Cousins on 08/19/2017 09:34:31 Woerner, [name] ([id number]) -------------------------------------------------------------------------------- Multi-Disciplinary Care Plan Details Patient Name: [name] Date of Service: 08/19/2017 9:00 AM Medical Record Number: [id number] Patient Account Number: [id number] Date of Birth/Sex: [birth year] (76 y.o. M) Treating RN: Cornell Barman Primary Care Yoanna Jurczyk: Cyndi Bender Other Clinician: Referring Jaylun Fleener: Cyndi Bender Treating Jarek Longton/Extender: Cathie Olden in Treatment: 71 Active Inactive ` Abuse / Safety / Falls / Self Care Management Nursing Diagnoses: History of Falls Goals: Patient will remain injury free related to falls Date Initiated: 04/08/2017 Target Resolution Date: 05/08/2017 Goal Status: Active Interventions: Assess fall risk on admission and as needed Notes: ` Orientation to the Wound Care Program Nursing Diagnoses: Knowledge deficit related to the wound healing center program Goals: Patient/caregiver will verbalize understanding of the [place]  Program Date Initiated: 04/08/2017 Target Resolution Date: 05/08/2017 Goal Status: Active Interventions: Provide education on orientation to the wound center Notes: ` Soft Tissue Infection Nursing Diagnoses: Impaired tissue integrity Potential for infection: soft tissue Goals: Patient will remain free of wound infection Date Initiated: 04/08/2017 Target Resolution Date: 05/08/2017 Goal Status: Active CHANDLOR, NOECKER ([id number]) Interventions: Assess signs and symptoms of infection every visit Notes: ` Wound/Skin Impairment Nursing Diagnoses: Impaired tissue integrity Goals: Ulcer/skin breakdown will heal within 14 weeks Date Initiated: 04/08/2017 Target Resolution Date: 07/20/2017 Goal Status: Active Interventions: Assess patient/caregiver ability to perform ulcer/skin care regimen upon admission and as needed Provide education on ulcer and skin care Treatment Activities: Topical wound management initiated : 04/08/2017 Notes: Electronic Signature(s) Signed: 08/19/2017 5:18:50 PM By: Gretta Cool, BSN, RN, CWS, Kim RN, BSN Entered By: Gretta Cool, BSN, RN, CWS, Kim on 08/19/2017 09:23:42 Lama, [name] ([id number]) -------------------------------------------------------------------------------- Pain Assessment Details Patient Name: [name] Date of Service: 08/19/2017 9:00 AM Medical Record Number: [id number] Patient Account Number: [id number] Date of Birth/Sex: 1940-02-04 (76 y.o. M) Treating RN: Ahmed Prima Primary Care Sabastien Tyler: Cyndi Bender Other Clinician: Referring Tru Leopard: Cyndi Bender Treating Barnie Sopko/Extender: Cathie Olden in Treatment: 19 Active Problems Location of Pain Severity and Description of Pain Patient Has Paino No Site Locations Pain Management and Medication Current Pain Management: Electronic Signature(s) Signed: 08/24/2017 5:34:19 PM By: Alric Quan Entered By: Alric Quan on 08/19/2017 08:53:15 Adsit, [name]  ([id number]) -------------------------------------------------------------------------------- Patient/Caregiver Education Details Patient Name: [name] Date of Service: 08/19/2017 9:00 AM Medical Record Number: [id number] Patient Account Number: [id number] Date of Birth/Gender: 1940/04/11 (76 y.o. M)  Treating RN: Cornell Barman Primary Care Physician: Cyndi Bender Other Clinician: Referring Physician: Cyndi Bender Treating Physician/Extender: Cathie Olden in Treatment: 74 Education Assessment Education Provided To: Patient Education Topics Provided Wound Debridement: Handouts: Wound Debridement Methods: Explain/Verbal Responses: State content correctly Wound/Skin Impairment: Handouts: Caring for Your Ulcer Methods: Explain/Verbal Responses: State content correctly Electronic Signature(s) Signed: 08/19/2017 5:18:50 PM By: Gretta Cool, BSN, RN, CWS, Kim RN, BSN Entered By: Gretta Cool, BSN, RN, CWS, Kim on 08/19/2017 09:52:24 Favata, [name] ([id number]) -------------------------------------------------------------------------------- Wound Assessment Details Patient Name: [name] Date of Service: 08/19/2017 9:00 AM Medical Record Number: [id number] Patient Account Number: [id number] Date of Birth/Sex: 09/14/1940 (76 y.o. M) Treating RN: Ahmed Prima Primary Care Cezar Misiaszek: Cyndi Bender Other Clinician: Referring Aija Scarfo: Cyndi Bender Treating Avanna Sowder/Extender: Cathie Olden in Treatment: 19 Wound Status Wound Number: 15 Primary Venous Leg Ulcer Etiology: Wound Location: Right Lower Leg - Anterior, Proximal Wound Open Wounding Event: Gradually Appeared Status: Date Acquired: 07/22/2017 Comorbid Congestive Heart Failure, Coronary Artery Weeks Of Treatment: 4 History: Disease, Hypertension, Myocardial Infarction, Clustered Wound: No Osteoarthritis Photos Photo Uploaded By: Alric Quan on 08/19/2017 16:17:43 Wound Measurements Length:  (cm) 0.9 Width: (cm) 0.8 Depth: (cm) 0.1 Area: (cm) 0.565 Volume: (cm) 0.057 % Reduction in Area: -79.9% % Reduction in Volume: -83.9% Epithelialization: None Tunneling: No Undermining: No Wound Description Full Thickness Without Exposed Support Classification: Structures Wound Margin: Distinct, outline attached Exudate Medium Amount: Exudate Type: Serosanguineous Exudate Color: red, brown Foul Odor After Cleansing: No Slough/Fibrino Yes Wound Bed Granulation Amount: Small (1-33%) Exposed Structure Granulation Quality: Red, Pink Fascia Exposed: No Necrotic Amount: Large (67-100%) Fat Layer (Subcutaneous Tissue) Exposed: No Necrotic Quality: Eschar, Adherent Slough Tendon Exposed: No Muscle Exposed: No Joint Exposed: No Bone Exposed: No Villegas, Hence E. ([id number]) Periwound Skin Texture Texture Color No Abnormalities Noted: No No Abnormalities Noted: No Erythema: Yes Moisture Erythema Location: Circumferential No Abnormalities Noted: No Temperature / Pain Temperature: No Abnormality Tenderness on Palpation: Yes Wound Preparation Ulcer Cleansing: Rinsed/Irrigated with Saline, Other: soap and water, Topical Anesthetic Applied: Other: lidocaine 4%, Treatment Notes Wound #15 (Right, Proximal, Anterior Lower Leg) 1. Cleansed with: Clean wound with Normal Saline 2. Anesthetic Topical Lidocaine 4% cream to wound bed prior to debridement 4. Dressing Applied: Prisma Ag 5. Secondary Dressing Applied Dry Gauze Kerlix/Conform Notes ace wrap Electronic Signature(s) Signed: 08/24/2017 5:34:19 PM By: Alric Quan Entered By: Alric Quan on 08/19/2017 09:06:54 [place], [name] ([id number]) -------------------------------------------------------------------------------- Wound Assessment Details Patient Name: [name] Date of Service: 08/19/2017 9:00 AM Medical Record Number: [id number] Patient Account Number: [id number] Date of Birth/Sex:  [birth year] (76 y.o. M) Treating RN: Ahmed Prima Primary Care Eileene Kisling: Cyndi Bender Other Clinician: Referring Morrisa Aldaba: Cyndi Bender Treating Indyah Saulnier/Extender: Cathie Olden in Treatment: 19 Wound Status Wound Number: 16 Primary Venous Leg Ulcer Etiology: Wound Location: Right Lower Leg - Lateral Wound Open Wounding Event: Gradually Appeared Status: Date Acquired: 08/05/2017 Comorbid Congestive Heart Failure, Coronary Artery Weeks Of Treatment: 2 History: Disease, Hypertension, Myocardial Infarction, Clustered Wound: No Osteoarthritis Photos Photo Uploaded By: Alric Quan on 08/19/2017 16:18:18 Wound Measurements Length: (cm) 0.9 Width: (cm) 0.7 Depth: (cm) 0.1 Area: (cm) 0.495 Volume: (cm) 0.049 % Reduction in Area: -163.3% % Reduction in Volume: -157.9% Epithelialization: None Tunneling: No Undermining: No Wound Description Full Thickness Without Exposed Support Classification: Structures Wound Margin: Distinct, outline attached Exudate Medium Amount: Exudate Type: Serosanguineous Exudate Color: red, brown Foul Odor After Cleansing: No Slough/Fibrino Yes Wound Bed Granulation Amount: None Present (0%) Exposed Structure  Necrotic Amount: Large (67-100%) Fascia Exposed: No Necrotic Quality: Eschar, Adherent Slough Fat Layer (Subcutaneous Tissue) Exposed: No Tendon Exposed: No Muscle Exposed: No Joint Exposed: No Bone Exposed: No Gimpel, Collis E. ([id number]) Periwound Skin Texture Texture Color No Abnormalities Noted: No No Abnormalities Noted: No Moisture Temperature / Pain No Abnormalities Noted: No Temperature: No Abnormality Maceration: No Tenderness on Palpation: Yes Wound Preparation Ulcer Cleansing: Rinsed/Irrigated with Saline, Other: soap and water, Topical Anesthetic Applied: Other: lidocaine 4%, Treatment Notes Wound #16 (Right, Lateral Lower Leg) 1. Cleansed with: Clean wound with Normal Saline 2.  Anesthetic Topical Lidocaine 4% cream to wound bed prior to debridement 4. Dressing Applied: Prisma Ag 5. Secondary Dressing Applied Dry Gauze Kerlix/Conform Notes ace wrap Electronic Signature(s) Signed: 08/24/2017 5:34:19 PM By: Alric Quan Entered By: Alric Quan on 08/19/2017 09:08:02 [place], [name] ([id number]) -------------------------------------------------------------------------------- Wound Assessment Details Patient Name: [name] Date of Service: 08/19/2017 9:00 AM Medical Record Number: [id number] Patient Account Number: [id number] Date of Birth/Sex: 1940/09/25 (76 y.o. M) Treating RN: Ahmed Prima Primary Care Zehra Rucci: Cyndi Bender Other Clinician: Referring Chung Chagoya: Cyndi Bender Treating Nekeisha Aure/Extender: Cathie Olden in Treatment: 19 Wound Status Wound Number: 6 Primary Arterial Insufficiency Ulcer Etiology: Wound Location: Right Malleolus - Lateral Wound Open Wounding Event: Gradually Appeared Status: Date Acquired: 03/23/2017 Comorbid Congestive Heart Failure, Coronary Artery Weeks Of Treatment: 19 History: Disease, Hypertension, Myocardial Infarction, Clustered Wound: No Osteoarthritis Photos Photo Uploaded By: Alric Quan on 08/19/2017 16:18:19 Wound Measurements Length: (cm) 0.4 Width: (cm) 0.3 Depth: (cm) 0.3 Area: (cm) 0.094 Volume: (cm) 0.028 % Reduction in Area: -203.2% % Reduction in Volume: -833.3% Epithelialization: None Tunneling: No Undermining: No Wound Description Full Thickness Without Exposed Support Classification: Structures Wound Margin: Distinct, outline attached Exudate Large Amount: Exudate Type: Serous Exudate Color: amber Foul Odor After Cleansing: No Slough/Fibrino Yes Wound Bed Granulation Amount: None Present (0%) Exposed Structure Necrotic Amount: Large (67-100%) Fascia Exposed: No Necrotic Quality: Adherent Slough Fat Layer (Subcutaneous Tissue) Exposed:  No Tendon Exposed: No Muscle Exposed: No Joint Exposed: No Bone Exposed: No Bruyere, Nikash E. ([id number]) Periwound Skin Texture Texture Color No Abnormalities Noted: No No Abnormalities Noted: No Callus: Yes Atrophie Blanche: No Crepitus: No Cyanosis: No Excoriation: No Ecchymosis: No Induration: No Erythema: Yes Rash: No Erythema Location: Circumferential Scarring: No Hemosiderin Staining: No Mottled: No Moisture Pallor: No No Abnormalities Noted: No Rubor: No Dry / Scaly: No Maceration: No Temperature / Pain Temperature: No Abnormality Tenderness on Palpation: Yes Wound Preparation Ulcer Cleansing: Other: soap and water, Topical Anesthetic Applied: Other: lidocaine 4%, Treatment Notes Wound #6 (Right, Lateral Malleolus) 1. Cleansed with: Clean wound with Normal Saline 2. Anesthetic Topical Lidocaine 4% cream to wound bed prior to debridement 4. Dressing Applied: Santyl Ointment 5. Secondary Dressing Applied Dry Gauze Notes kerlix and ace wrrap Electronic Signature(s) Signed: 08/24/2017 5:34:19 PM By: Alric Quan Entered By: Alric Quan on 08/19/2017 09:08:46 Schoneman, [name] ([id number]) -------------------------------------------------------------------------------- Wound Assessment Details Patient Name: [name] Date of Service: 08/19/2017 9:00 AM Medical Record Number: [id number] Patient Account Number: [id number] Date of Birth/Sex: Jun 04, 1940 (76 y.o. M) Treating RN: Ahmed Prima Primary Care Ayano Douthitt: Cyndi Bender Other Clinician: Referring Keya Wynes: Cyndi Bender Treating Harnoor Reta/Extender: Cathie Olden in Treatment: 19 Wound Status Wound Number: 8 Primary Arterial Insufficiency Ulcer Etiology: Wound Location: Right [name] - Lateral Wound Open Wounding Event: Gradually Appeared Status: Date Acquired: 04/29/2017 Comorbid Congestive Heart Failure, Coronary Artery Weeks Of Treatment: 16 History: Disease,  Hypertension, Myocardial Infarction, Clustered Wound: No  Osteoarthritis Photos Photo Uploaded By: Alric Quan on 08/19/2017 16:18:52 Wound Measurements Length: (cm) 0.7 Width: (cm) 0.7 Depth: (cm) 0.4 Area: (cm) 0.385 Volume: (cm) 0.154 % Reduction in Area: -442.3% % Reduction in Volume: -2100% Epithelialization: None Tunneling: No Undermining: No Wound Description Full Thickness With Exposed Support Classification: Structures Wound Margin: Distinct, outline attached Exudate Large Amount: Exudate Type: Serous Exudate Color: amber Foul Odor After Cleansing: No Slough/Fibrino Yes Wound Bed Granulation Amount: None Present (0%) Exposed Structure Necrotic Amount: Large (67-100%) Fascia Exposed: No Necrotic Quality: Adherent Slough Fat Layer (Subcutaneous Tissue) Exposed: Yes Tendon Exposed: No Muscle Exposed: No Joint Exposed: No Bone Exposed: Yes Egle, Mandel E. ([id number]) Periwound Skin Texture Texture Color No Abnormalities Noted: No No Abnormalities Noted: No Callus: Yes Erythema: Yes Erythema Location: Circumferential Moisture No Abnormalities Noted: No Temperature / Pain Maceration: Yes Temperature: No Abnormality Tenderness on Palpation: Yes Wound Preparation Ulcer Cleansing: Other: soap and water, Topical Anesthetic Applied: Other: lidocaine 4%, Treatment Notes Wound #8 (Right, Lateral [name]) 1. Cleansed with: Clean wound with Normal Saline 2. Anesthetic Topical Lidocaine 4% cream to wound bed prior to debridement 4. Dressing Applied: Santyl Ointment 5. Secondary Dressing Applied Dry Gauze Notes kerlix and ace wrrap Electronic Signature(s) Signed: 08/24/2017 5:34:19 PM By: Alric Quan Entered By: Alric Quan on 08/19/2017 09:09:54 Toombs, [name] ([id number]) -------------------------------------------------------------------------------- Wound Assessment Details Patient Name: [name] Date of Service:  08/19/2017 9:00 AM Medical Record Number: [id number] Patient Account Number: [id number] Date of Birth/Sex: [birth year] (76 y.o. M) Treating RN: Ahmed Prima Primary Care Elmire Amrein: Cyndi Bender Other Clinician: Referring Ryker Sudbury: Cyndi Bender Treating Jaman Aro/Extender: Cathie Olden in Treatment: 19 Wound Status Wound Number: 9 Primary Pressure Ulcer Etiology: Wound Location: Left Metatarsal head first Wound Open Wounding Event: Gradually Appeared Status: Date Acquired: 05/06/2017 Comorbid Congestive Heart Failure, Coronary Artery Weeks Of Treatment: 13 History: Disease, Hypertension, Myocardial Infarction, Clustered Wound: No Osteoarthritis Photos Photo Uploaded By: Alric Quan on 08/19/2017 16:18:53 Wound Measurements Length: (cm) 0.3 Width: (cm) 0.2 Depth: (cm) 0.1 Area: (cm) 0.047 Volume: (cm) 0.005 % Reduction in Area: 76% % Reduction in Volume: 75% Epithelialization: None Tunneling: No Undermining: No Wound Description Classification: Category/Stage II Wound Margin: Flat and Intact Exudate Amount: Large Exudate Type: Serous Exudate Color: amber Foul Odor After Cleansing: No Slough/Fibrino Yes Wound Bed Granulation Amount: Large (67-100%) Exposed Structure Granulation Quality: Red Fascia Exposed: No Necrotic Amount: Small (1-33%) Fat Layer (Subcutaneous Tissue) Exposed: No Necrotic Quality: Adherent Slough Tendon Exposed: No Muscle Exposed: No Joint Exposed: No Bone Exposed: No Periwound Skin Texture Mealing, Nahome E. ([id number]) Texture Color No Abnormalities Noted: No No Abnormalities Noted: No Callus: Yes Atrophie Blanche: No Crepitus: No Cyanosis: No Excoriation: No Ecchymosis: No Induration: No Erythema: No Rash: No Hemosiderin Staining: No Scarring: No Mottled: No Pallor: No Moisture Rubor: No No Abnormalities Noted: No Dry / Scaly: No Temperature / Pain Maceration: Yes Temperature: No Abnormality Wound  Preparation Ulcer Cleansing: Rinsed/Irrigated with Saline, Other: soap and water, Topical Anesthetic Applied: Other: lidocaine 4%, Treatment Notes Wound #9 (Left Metatarsal head first) 5. Secondary Dressing Applied Bordered Foam Dressing Electronic Signature(s) Signed: 08/24/2017 5:34:19 PM By: Alric Quan Entered By: Alric Quan on 08/19/2017 09:11:09 Mckeon, [name] ([id number]) -------------------------------------------------------------------------------- Vitals Details Patient Name: [name] Date of Service: 08/19/2017 9:00 AM Medical Record Number: [id number] Patient Account Number: [id number] Date of Birth/Sex: 06-23-1940 (76 y.o. M) Treating RN: Ahmed Prima Primary Care Hasel Janish: Cyndi Bender Other Clinician: Referring Epsie Walthall: Cyndi Bender Treating Mae Cianci/Extender: Lawanda Cousins  Weeks in Treatment: 19 Vital Signs Time Taken: 08:53 Temperature (F): 97.6 Height (in): 68 Pulse (bpm): 70 Respiratory Rate (breaths/min): 16 Blood Pressure (mmHg): 93/46 Reference Range: 80 - 120 mg / dl Notes Made Leah aware of BP. Electronic Signature(s) Signed: 08/24/2017 5:34:19 PM By: Alric Quan Entered By: Alric Quan on 08/19/2017 08:57:57

## 2017-09-04 DIAGNOSIS — I2510 Atherosclerotic heart disease of native coronary artery without angina pectoris: Secondary | ICD-10-CM | POA: Diagnosis not present

## 2017-09-04 DIAGNOSIS — I5023 Acute on chronic systolic (congestive) heart failure: Secondary | ICD-10-CM | POA: Diagnosis not present

## 2017-09-04 DIAGNOSIS — I110 Hypertensive heart disease with heart failure: Secondary | ICD-10-CM | POA: Diagnosis not present

## 2017-09-04 DIAGNOSIS — Z48817 Encounter for surgical aftercare following surgery on the skin and subcutaneous tissue: Secondary | ICD-10-CM | POA: Diagnosis not present

## 2017-09-04 DIAGNOSIS — L97511 Non-pressure chronic ulcer of other part of right foot limited to breakdown of skin: Secondary | ICD-10-CM | POA: Diagnosis not present

## 2017-09-04 DIAGNOSIS — L97311 Non-pressure chronic ulcer of right ankle limited to breakdown of skin: Secondary | ICD-10-CM | POA: Diagnosis not present

## 2017-09-07 DIAGNOSIS — I110 Hypertensive heart disease with heart failure: Secondary | ICD-10-CM | POA: Diagnosis not present

## 2017-09-07 DIAGNOSIS — I5023 Acute on chronic systolic (congestive) heart failure: Secondary | ICD-10-CM | POA: Diagnosis not present

## 2017-09-07 DIAGNOSIS — I2510 Atherosclerotic heart disease of native coronary artery without angina pectoris: Secondary | ICD-10-CM | POA: Diagnosis not present

## 2017-09-07 DIAGNOSIS — L97511 Non-pressure chronic ulcer of other part of right foot limited to breakdown of skin: Secondary | ICD-10-CM | POA: Diagnosis not present

## 2017-09-07 DIAGNOSIS — Z48817 Encounter for surgical aftercare following surgery on the skin and subcutaneous tissue: Secondary | ICD-10-CM | POA: Diagnosis not present

## 2017-09-07 DIAGNOSIS — L97311 Non-pressure chronic ulcer of right ankle limited to breakdown of skin: Secondary | ICD-10-CM | POA: Diagnosis not present

## 2017-09-09 DIAGNOSIS — Z48817 Encounter for surgical aftercare following surgery on the skin and subcutaneous tissue: Secondary | ICD-10-CM | POA: Diagnosis not present

## 2017-09-09 DIAGNOSIS — L97511 Non-pressure chronic ulcer of other part of right foot limited to breakdown of skin: Secondary | ICD-10-CM | POA: Diagnosis not present

## 2017-09-09 DIAGNOSIS — L97311 Non-pressure chronic ulcer of right ankle limited to breakdown of skin: Secondary | ICD-10-CM | POA: Diagnosis not present

## 2017-09-09 DIAGNOSIS — I110 Hypertensive heart disease with heart failure: Secondary | ICD-10-CM | POA: Diagnosis not present

## 2017-09-09 DIAGNOSIS — I2510 Atherosclerotic heart disease of native coronary artery without angina pectoris: Secondary | ICD-10-CM | POA: Diagnosis not present

## 2017-09-09 DIAGNOSIS — I5023 Acute on chronic systolic (congestive) heart failure: Secondary | ICD-10-CM | POA: Diagnosis not present

## 2017-09-11 DIAGNOSIS — I110 Hypertensive heart disease with heart failure: Secondary | ICD-10-CM | POA: Diagnosis not present

## 2017-09-11 DIAGNOSIS — L97511 Non-pressure chronic ulcer of other part of right foot limited to breakdown of skin: Secondary | ICD-10-CM | POA: Diagnosis not present

## 2017-09-11 DIAGNOSIS — Z48817 Encounter for surgical aftercare following surgery on the skin and subcutaneous tissue: Secondary | ICD-10-CM | POA: Diagnosis not present

## 2017-09-11 DIAGNOSIS — I2510 Atherosclerotic heart disease of native coronary artery without angina pectoris: Secondary | ICD-10-CM | POA: Diagnosis not present

## 2017-09-11 DIAGNOSIS — L97311 Non-pressure chronic ulcer of right ankle limited to breakdown of skin: Secondary | ICD-10-CM | POA: Diagnosis not present

## 2017-09-11 DIAGNOSIS — I5023 Acute on chronic systolic (congestive) heart failure: Secondary | ICD-10-CM | POA: Diagnosis not present

## 2017-09-14 DIAGNOSIS — I2510 Atherosclerotic heart disease of native coronary artery without angina pectoris: Secondary | ICD-10-CM | POA: Diagnosis not present

## 2017-09-14 DIAGNOSIS — I110 Hypertensive heart disease with heart failure: Secondary | ICD-10-CM | POA: Diagnosis not present

## 2017-09-14 DIAGNOSIS — Z48817 Encounter for surgical aftercare following surgery on the skin and subcutaneous tissue: Secondary | ICD-10-CM | POA: Diagnosis not present

## 2017-09-14 DIAGNOSIS — I5023 Acute on chronic systolic (congestive) heart failure: Secondary | ICD-10-CM | POA: Diagnosis not present

## 2017-09-14 DIAGNOSIS — L97511 Non-pressure chronic ulcer of other part of right foot limited to breakdown of skin: Secondary | ICD-10-CM | POA: Diagnosis not present

## 2017-09-14 DIAGNOSIS — L97311 Non-pressure chronic ulcer of right ankle limited to breakdown of skin: Secondary | ICD-10-CM | POA: Diagnosis not present

## 2017-09-16 ENCOUNTER — Encounter: Payer: Medicare Other | Attending: Internal Medicine | Admitting: Internal Medicine

## 2017-09-16 DIAGNOSIS — L97521 Non-pressure chronic ulcer of other part of left foot limited to breakdown of skin: Secondary | ICD-10-CM | POA: Insufficient documentation

## 2017-09-16 DIAGNOSIS — L97312 Non-pressure chronic ulcer of right ankle with fat layer exposed: Secondary | ICD-10-CM | POA: Diagnosis not present

## 2017-09-16 DIAGNOSIS — I472 Ventricular tachycardia: Secondary | ICD-10-CM | POA: Diagnosis not present

## 2017-09-16 DIAGNOSIS — I70235 Atherosclerosis of native arteries of right leg with ulceration of other part of foot: Secondary | ICD-10-CM | POA: Insufficient documentation

## 2017-09-16 DIAGNOSIS — I255 Ischemic cardiomyopathy: Secondary | ICD-10-CM | POA: Insufficient documentation

## 2017-09-16 DIAGNOSIS — I509 Heart failure, unspecified: Secondary | ICD-10-CM | POA: Insufficient documentation

## 2017-09-16 DIAGNOSIS — M069 Rheumatoid arthritis, unspecified: Secondary | ICD-10-CM | POA: Insufficient documentation

## 2017-09-16 DIAGNOSIS — I110 Hypertensive heart disease with heart failure: Secondary | ICD-10-CM | POA: Insufficient documentation

## 2017-09-16 DIAGNOSIS — I252 Old myocardial infarction: Secondary | ICD-10-CM | POA: Insufficient documentation

## 2017-09-16 DIAGNOSIS — I2510 Atherosclerotic heart disease of native coronary artery without angina pectoris: Secondary | ICD-10-CM | POA: Insufficient documentation

## 2017-09-16 DIAGNOSIS — L97512 Non-pressure chronic ulcer of other part of right foot with fat layer exposed: Secondary | ICD-10-CM | POA: Insufficient documentation

## 2017-09-16 DIAGNOSIS — I70233 Atherosclerosis of native arteries of right leg with ulceration of ankle: Secondary | ICD-10-CM | POA: Diagnosis not present

## 2017-09-18 DIAGNOSIS — L97311 Non-pressure chronic ulcer of right ankle limited to breakdown of skin: Secondary | ICD-10-CM | POA: Diagnosis not present

## 2017-09-18 DIAGNOSIS — I5023 Acute on chronic systolic (congestive) heart failure: Secondary | ICD-10-CM | POA: Diagnosis not present

## 2017-09-18 DIAGNOSIS — Z48817 Encounter for surgical aftercare following surgery on the skin and subcutaneous tissue: Secondary | ICD-10-CM | POA: Diagnosis not present

## 2017-09-18 DIAGNOSIS — I2510 Atherosclerotic heart disease of native coronary artery without angina pectoris: Secondary | ICD-10-CM | POA: Diagnosis not present

## 2017-09-18 DIAGNOSIS — L97511 Non-pressure chronic ulcer of other part of right foot limited to breakdown of skin: Secondary | ICD-10-CM | POA: Diagnosis not present

## 2017-09-18 DIAGNOSIS — I110 Hypertensive heart disease with heart failure: Secondary | ICD-10-CM | POA: Diagnosis not present

## 2017-09-18 NOTE — Progress Notes (Signed)
IDUS, RATHKE ([id number]) Visit Report for 09/16/2017 Arrival Information Details Patient Name: [name], [name] Date of Service: 09/16/2017 9:15 AM Medical Record Number: [id number] Patient Account Number: [id number] Date of Birth/Sex: January 31, 1940 (76 y.o. M) Treating RN: Roger Shelter Primary Care Samella Lucchetti: Cyndi Bender Other Clinician: Referring Deondra Labrador: Cyndi Bender Treating Kathryn Cosby/Extender: Tito Dine in Treatment: 23 Visit Information History Since Last Visit Added or deleted any medications: No Patient Arrived: Cane Any new allergies or adverse reactions: No Arrival Time: 09:18 Had a fall or experienced change in No Accompanied By: wife activities of daily living that may affect Transfer Assistance: None risk of falls: Patient Identification Verified: Yes Signs or symptoms of abuse/neglect since last visito No Secondary Verification Process Completed: Yes Hospitalized since last visit: No Patient Requires Transmission-Based No Implantable device outside of the clinic excluding No Precautions: cellular tissue based products placed in the center Patient Has Alerts: Yes since last visit: Patient Alerts: R ABI Has Dressing in Place as Prescribed: Yes >220 Pain Present Now: No L ABI >220 Electronic Signature(s) Signed: 09/16/2017 1:16:02 PM By: Lorine Bears RCP, RRT, CHT Entered By: Lorine Bears on 09/16/2017 09:19:46 [name], [name] ([id number]) -------------------------------------------------------------------------------- Encounter Discharge Information Details Patient Name: [name] Date of Service: 09/16/2017 9:15 AM Medical Record Number: [id number] Patient Account Number: [id number] Date of Birth/Sex: 12/02/1940 (76 y.o. M) Treating RN: Montey Hora Primary Care Ajani Rineer: Cyndi Bender Other Clinician: Referring Cailin Gebel: Cyndi Bender Treating Angell Pincock/Extender: Tito Dine in Treatment: 32 Encounter Discharge Information Items Discharge Condition: Stable Ambulatory Status: Cane Discharge Destination: Home Transportation: Private Auto Accompanied By: spouse Schedule Follow-up Appointment: Yes Clinical Summary of Care: Electronic Signature(s) Signed: 09/16/2017 11:44:22 AM By: Montey Hora Entered By: Montey Hora on 09/16/2017 11:44:21 [name], [name] ([id number]) -------------------------------------------------------------------------------- Lower Extremity Assessment Details Patient Name: [name] Date of Service: 09/16/2017 9:15 AM Medical Record Number: [id number] Patient Account Number: [id number] Date of Birth/Sex: 28-Feb-1940 (76 y.o. M) Treating RN: Secundino Ginger Primary Care Kali Ambler: Cyndi Bender Other Clinician: Referring Ariana Juul: Cyndi Bender Treating Shanty Ginty/Extender: Tito Dine in Treatment: 23 Vascular Assessment Claudication: Claudication Assessment [Left:None] [Right:None] Pulses: Dorsalis Pedis Palpable: [Left:Yes] [Right:Yes] Posterior Tibial Extremity colors, hair growth, and conditions: Extremity Color: [Left:Normal] [Right:Normal] Temperature of Extremity: [Left:Warm] [Right:Warm] Capillary Refill: [Left:< 3 seconds] [Right:< 3 seconds] Toe Nail Assessment Left: Right: Thick: Yes Yes Discolored: Yes Yes Deformed: Yes Yes Improper Length and Hygiene: No No Electronic Signature(s) Signed: 09/16/2017 11:50:50 AM By: Secundino Ginger Entered By: Secundino Ginger on 09/16/2017 09:43:03 [place], [name] ([id number]) -------------------------------------------------------------------------------- Multi Wound Chart Details Patient Name: [name] Date of Service: 09/16/2017 9:15 AM Medical Record Number: [id number] Patient Account Number: [id number] Date of Birth/Sex: Jul 11, 1940 (76 y.o. M) Treating RN: Roger Shelter Primary Care Brenn Deziel: Cyndi Bender Other Clinician: Referring  Carman Essick: Cyndi Bender Treating Etosha Wetherell/Extender: Tito Dine in Treatment: 23 Vital Signs Height(in): 68 Pulse(bpm): 48 Weight(lbs): Blood Pressure(mmHg): 88/52 Body Mass Index(BMI): Temperature(F): 97.7 Respiratory Rate 16 (breaths/min): Photos: [15:No Photos] [16:No Photos] Wound Location: [15:Right Lower Leg - Anterior, Proximal] [16:Right Lower Leg - Lateral] Wounding Event: [15:Gradually Appeared] [16:Gradually Appeared] Primary Etiology: [15:Venous Leg Ulcer] [16:Venous Leg Ulcer] Comorbid History: [15:Congestive Heart Failure, Coronary Artery Disease, Hypertension, Myocardial Infarction, Osteoarthritis] [16:Congestive Heart Failure, Coronary Artery Disease, Hypertension, Myocardial Infarction, Osteoarthritis] Date Acquired: [15:07/22/2017] [16:08/05/2017] Weeks of Treatment: [15:8] [16:6] Wound Status: [15:Open] [16:Open] Measurements L x W x D [15:0.1x0.1x0.1] [16:0.1x0.1x0.1] (cm) Area (cm) : [15:0.008] [16:0.008] Volume (cm) : [  15:0.001] [16:0.001] % Reduction in Area: [15:97.50%] [16:95.70%] % Reduction in Volume: [15:96.80%] [16:94.70%] Classification: [15:Full Thickness Without Exposed Support Structures] [16:Full Thickness Without Exposed Support Structures] Exudate Amount: [15:None Present] [16:None Present] Exudate Type: [15:N/A] [16:N/A] Exudate Color: [15:N/A] [16:N/A] Wound Margin: [15:Distinct, outline attached] [16:Distinct, outline attached] Granulation Amount: [15:None Present (0%)] [16:None Present (0%)] Granulation Quality: [15:N/A] [16:N/A] Necrotic Amount: [15:None Present (0%)] [16:None Present (0%)] Exposed Structures: [15:Fascia: No Fat Layer (Subcutaneous Tissue) Exposed: No Tendon: No Muscle: No Joint: No Bone: No] [16:Fascia: No Fat Layer (Subcutaneous Tissue) Exposed: No Tendon: No Muscle: No Joint: No Bone: No] Epithelialization: [15:None] [16:None] Periwound Skin Texture: [15:No Abnormalities Noted] [16:No Abnormalities  Noted] Induration: No Crepitus: No Rash: No Scarring: No Periwound Skin Moisture: Maceration: No Maceration: No Maceration: No Dry/Scaly: No Dry/Scaly: No Periwound Skin Color: Erythema: Yes No Abnormalities Noted Erythema: Yes Atrophie Blanche: No Cyanosis: No Ecchymosis: No Hemosiderin Staining: No Mottled: No Pallor: No Rubor: No Erythema Location: N/A N/A Circumferential Temperature: No Abnormality No Abnormality No Abnormality Tenderness on Palpation: No Yes Yes Wound Preparation: Ulcer Cleansing: Ulcer Cleansing: Ulcer Cleansing: Other: soap Rinsed/Irrigated with Saline Rinsed/Irrigated with Saline and water Topical Anesthetic Applied: Other: lidocaine 4% Wound Number: [id number] N/A Photos: No Photos No Photos N/A Wound Location: Right [name] - Lateral Left Metatarsal head first N/A Wounding Event: Gradually Appeared Gradually Appeared N/A Primary Etiology: Arterial Insufficiency Ulcer Pressure Ulcer N/A Comorbid History: Congestive Heart Failure, Congestive Heart Failure, N/A Coronary Artery Disease, Coronary Artery Disease, Hypertension, Myocardial Hypertension, Myocardial Infarction, Osteoarthritis Infarction, Osteoarthritis Date Acquired: 04/29/2017 05/06/2017 N/A Weeks of Treatment: 20 17 N/A Wound Status: Open Open N/A Measurements L x W x D 0.4x0.4x0.4 0.2x0.2x0.1 N/A (cm) Area (cm) : 0.126 0.031 N/A Volume (cm) : 0.05 0.003 N/A % Reduction in Area: -77.50% 84.20% N/A % Reduction in Volume: -614.30% 85.00% N/A Classification: Full Thickness With Exposed Category/Stage II N/A Support Structures Exudate Amount: Small None Present N/A Exudate Type: Serous N/A N/A Exudate Color: amber N/A N/A Wound Margin: Distinct, outline attached Flat and Intact N/A Granulation Amount: None Present (0%) Small (1-33%) N/A Granulation Quality: N/A Pink N/A Necrotic Amount: Large (67-100%) Small (1-33%) N/A Exposed Structures: Fat Layer (Subcutaneous Fascia: No  N/A Tissue) Exposed: Yes Fat Layer (Subcutaneous Bone: Yes Tissue) Exposed: No Fascia: No Tendon: No Tendon: No Muscle: No Muscle: No Joint: No Joint: No Bone: No Epithelialization: None None N/A Helmkamp, Keylan E. ([id number]) Periwound Skin Texture: Callus: Yes Excoriation: No N/A Induration: No Callus: No Crepitus: No Rash: No Scarring: No Periwound Skin Moisture: Maceration: Yes Maceration: Yes N/A Dry/Scaly: No Periwound Skin Color: Erythema: Yes Atrophie Blanche: No N/A Cyanosis: No Ecchymosis: No Erythema: No Hemosiderin Staining: No Mottled: No Pallor: No Rubor: No Erythema Location: Circumferential N/A N/A Temperature: No Abnormality No Abnormality N/A Tenderness on Palpation: Yes No N/A Wound Preparation: Ulcer Cleansing: Ulcer Cleansing: N/A Rinsed/Irrigated with Saline Rinsed/Irrigated with Saline Topical Anesthetic Applied: Topical Anesthetic Applied: Other: lidocaine 4% Other: lidocaine 4% Treatment Notes Electronic Signature(s) Signed: 09/16/2017 5:03:38 PM By: Roger Shelter Entered By: Roger Shelter on 09/16/2017 09:48:53 [name], [name] ([id number]) -------------------------------------------------------------------------------- Multi-Disciplinary Care Plan Details Patient Name: [name] Date of Service: 09/16/2017 9:15 AM Medical Record Number: [id number] Patient Account Number: [id number] Date of Birth/Sex: [birth year] (76 y.o. M) Treating RN: Roger Shelter Primary Care Felis Quillin: Cyndi Bender Other Clinician: Referring Taneya Conkel: Cyndi Bender Treating Sharaya Boruff/Extender: Tito Dine in Treatment: 5 Active Inactive ` Abuse / Safety / Falls / [place]  Diagnoses: History of Falls Goals: Patient will remain injury free related to falls Date Initiated: 04/08/2017 Target Resolution Date: 05/08/2017 Goal Status: Active Interventions: Assess fall risk on admission and as  needed Notes: ` Orientation to the Wound Care Program Nursing Diagnoses: Knowledge deficit related to the wound healing center program Goals: Patient/caregiver will verbalize understanding of the [facility] Date Initiated: 04/08/2017 Target Resolution Date: 05/08/2017 Goal Status: Active Interventions: Provide education on orientation to the wound center Notes: ` Soft Tissue Infection Nursing Diagnoses: Impaired tissue integrity Potential for infection: soft tissue Goals: Patient will remain free of wound infection Date Initiated: 04/08/2017 Target Resolution Date: 05/08/2017 Goal Status: Active LEOTHA, VOELTZ ([id number]) Interventions: Assess signs and symptoms of infection every visit Notes: ` Wound/Skin Impairment Nursing Diagnoses: Impaired tissue integrity Goals: Ulcer/skin breakdown will heal within 14 weeks Date Initiated: 04/08/2017 Target Resolution Date: 07/20/2017 Goal Status: Active Interventions: Assess patient/caregiver ability to perform ulcer/skin care regimen upon admission and as needed Provide education on ulcer and skin care Treatment Activities: Topical wound management initiated : 04/08/2017 Notes: Electronic Signature(s) Signed: 09/16/2017 5:03:38 PM By: Roger Shelter Entered By: Roger Shelter on 09/16/2017 09:48:45 [name], [name] ([id number]) -------------------------------------------------------------------------------- Pain Assessment Details Patient Name: [name] Date of Service: 09/16/2017 9:15 AM Medical Record Number: [id number] Patient Account Number: [id number] Date of Birth/Sex: Jan 22, 1940 (76 y.o. M) Treating RN: Roger Shelter Primary Care Shedrick Sarli: Cyndi Bender Other Clinician: Referring Jahleah Mariscal: Cyndi Bender Treating Yolinda Duerr/Extender: Tito Dine in Treatment: 23 Active Problems Location of Pain Severity and Description of Pain Patient Has Paino No Site Locations Pain  Management and Medication Current Pain Management: Electronic Signature(s) Signed: 09/16/2017 1:16:02 PM By: Lorine Bears RCP, RRT, CHT Signed: 09/16/2017 5:03:38 PM By: Roger Shelter Entered By: Lorine Bears on 09/16/2017 09:19:54 [name], [name] ([id number]) -------------------------------------------------------------------------------- Patient/Caregiver Education Details Patient Name: [name] Date of Service: 09/16/2017 9:15 AM Medical Record Number: [id number] Patient Account Number: [id number] Date of Birth/Gender: 06-May-1940 (76 y.o. M) Treating RN: Montey Hora Primary Care Physician: Cyndi Bender Other Clinician: Referring Physician: Cyndi Bender Treating Physician/Extender: Tito Dine in Treatment: 16 Education Assessment Education Provided To: Patient and Caregiver Education Topics Provided Wound/Skin Impairment: Handouts: Other: wound care as ordered Methods: Demonstration, Explain/Verbal Responses: State content correctly Electronic Signature(s) Signed: 09/16/2017 5:20:22 PM By: Montey Hora Entered By: Montey Hora on 09/16/2017 11:44:46 [name], [name] ([id number]) -------------------------------------------------------------------------------- Wound Assessment Details Patient Name: [name] Date of Service: 09/16/2017 9:15 AM Medical Record Number: [id number] Patient Account Number: [id number] Date of Birth/Sex: Mar 07, 1940 (76 y.o. M) Treating RN: Roger Shelter Primary Care Kennith Morss: Cyndi Bender Other Clinician: Referring Atharv Barriere: Cyndi Bender Treating Davan Hark/Extender: Tito Dine in Treatment: 23 Wound Status Wound Number: 15 Primary Venous Leg Ulcer Etiology: Wound Location: Right, Proximal, Anterior Lower Leg Wound Healed - Epithelialized Wounding Event: Gradually Appeared Status: Date Acquired: 07/22/2017 Comorbid Congestive Heart Failure, Coronary  Artery Weeks Of Treatment: 8 History: Disease, Hypertension, Myocardial Infarction, Clustered Wound: No Osteoarthritis Photos Photo Uploaded By: Secundino Ginger on 09/16/2017 10:05:10 Wound Measurements Length: (cm) 0 % Red Width: (cm) 0 % Red Depth: (cm) 0 Epith Area: (cm) 0 Tunn Volume: (cm) 0 Unde uction in Area: 100% uction in Volume: 100% elialization: None eling: No rmining: No Wound Description Full Thickness Without Exposed Support Classification: Structures Wound Margin: Distinct, outline attached Exudate None Present Amount: Foul Odor After Cleansing: No Slough/Fibrino Yes Wound Bed Granulation Amount: None Present (0%) Exposed Structure Necrotic  Amount: None Present (0%) Fascia Exposed: No Fat Layer (Subcutaneous Tissue) Exposed: No Tendon Exposed: No Muscle Exposed: No Joint Exposed: No Bone Exposed: No Periwound Skin Texture [name], Jermaine E. ([id number]) Texture Color No Abnormalities Noted: Yes No Abnormalities Noted: Yes Moisture Temperature / Pain No Abnormalities Noted: No Temperature: No Abnormality Dry / Scaly: No Maceration: No Wound Preparation Ulcer Cleansing: Rinsed/Irrigated with Saline Electronic Signature(s) Signed: 09/16/2017 5:03:38 PM By: Roger Shelter Entered By: Roger Shelter on 09/16/2017 09:53:12 [place], [name] ([id number]) -------------------------------------------------------------------------------- Wound Assessment Details Patient Name: [name] Date of Service: 09/16/2017 9:15 AM Medical Record Number: [id number] Patient Account Number: [id number] Date of Birth/Sex: 07/22/1940 (76 y.o. M) Treating RN: Roger Shelter Primary Care Aleksandra Raben: Cyndi Bender Other Clinician: Referring Antar Milks: Cyndi Bender Treating Geanna Divirgilio/Extender: Tito Dine in Treatment: 23 Wound Status Wound Number: 16 Primary Venous Leg Ulcer Etiology: Wound Location: Right Lower Leg - Lateral Wound Healed -  Epithelialized Wounding Event: Gradually Appeared Status: Date Acquired: 08/05/2017 Comorbid Congestive Heart Failure, Coronary Artery Weeks Of Treatment: 6 History: Disease, Hypertension, Myocardial Infarction, Clustered Wound: No Osteoarthritis Photos Photo Uploaded By: Secundino Ginger on 09/16/2017 10:05:10 Wound Measurements Length: (cm) 0 % Redu Width: (cm) 0 % Redu Depth: (cm) 0 Epithe Area: (cm) 0 Tunne Volume: (cm) 0 Under ction in Area: 100% ction in Volume: 100% lialization: None ling: No mining: No Wound Description Full Thickness Without Exposed Support Foul O Classification: Structures Slough Wound Margin: Distinct, outline attached Exudate None Present Amount: dor After Cleansing: No /Fibrino No Wound Bed Granulation Amount: None Present (0%) Exposed Structure Necrotic Amount: None Present (0%) Fascia Exposed: No Fat Layer (Subcutaneous Tissue) Exposed: No Tendon Exposed: No Muscle Exposed: No Joint Exposed: No Bone Exposed: No Periwound Skin Texture [name], Jermaine E. ([id number]) Texture Color No Abnormalities Noted: Yes No Abnormalities Noted: Yes Moisture Temperature / Pain No Abnormalities Noted: No Temperature: No Abnormality Maceration: No Tenderness on Palpation: Yes Wound Preparation Ulcer Cleansing: Rinsed/Irrigated with Saline Electronic Signature(s) Signed: 09/16/2017 5:03:38 PM By: Roger Shelter Entered By: Roger Shelter on 09/16/2017 09:53:41 [place], [name] ([id number]) -------------------------------------------------------------------------------- Wound Assessment Details Patient Name: [name] Date of Service: 09/16/2017 9:15 AM Medical Record Number: [id number] Patient Account Number: [id number] Date of Birth/Sex: 04-07-1940 (76 y.o. M) Treating RN: Secundino Ginger Primary Care Darcia Lampi: Cyndi Bender Other Clinician: Referring Otie Headlee: Cyndi Bender Treating Easter Schinke/Extender: Tito Dine in  Treatment: 23 Wound Status Wound Number: 6 Primary Arterial Insufficiency Ulcer Etiology: Wound Location: Right Malleolus - Lateral Wound Open Wounding Event: Gradually Appeared Status: Date Acquired: 03/23/2017 Comorbid Congestive Heart Failure, Coronary Artery Weeks Of Treatment: 23 History: Disease, Hypertension, Myocardial Infarction, Clustered Wound: No Osteoarthritis Photos Photo Uploaded By: Secundino Ginger on 09/16/2017 10:06:42 Wound Measurements Length: (cm) 0.2 Width: (cm) 0.2 Depth: (cm) 0.3 Area: (cm) 0.031 Volume: (cm) 0.009 % Reduction in Area: 0% % Reduction in Volume: -200% Epithelialization: None Tunneling: No Undermining: No Wound Description Full Thickness Without Exposed Support Foul Od Classification: Structures Slough/ Wound Margin: Distinct, outline attached Exudate Small Amount: Exudate Type: Serous Exudate Color: amber or After Cleansing: No Fibrino Yes Wound Bed Granulation Amount: None Present (0%) Exposed Structure Necrotic Amount: Large (67-100%) Fascia Exposed: No Necrotic Quality: Adherent Slough Fat Layer (Subcutaneous Tissue) Exposed: Yes Tendon Exposed: No Muscle Exposed: No Joint Exposed: No Bone Exposed: No [name], Jermaine E. ([id number]) Periwound Skin Texture Texture Color No Abnormalities Noted: No No Abnormalities Noted: No Callus: Yes Atrophie Blanche: No Crepitus: No Cyanosis: No Excoriation: No Ecchymosis:  No Induration: No Erythema: Yes Rash: No Erythema Location: Circumferential Scarring: No Hemosiderin Staining: No Mottled: No Moisture Pallor: No No Abnormalities Noted: No Rubor: No Dry / Scaly: No Maceration: No Temperature / Pain Temperature: No Abnormality Tenderness on Palpation: Yes Wound Preparation Ulcer Cleansing: Other: soap and water, Topical Anesthetic Applied: Other: lidocaine 4%, Treatment Notes Wound #6 (Right, Lateral Malleolus) 1. Cleansed with: Clean wound with Normal  Saline 2. Anesthetic Topical Lidocaine 4% cream to wound bed prior to debridement 3. Peri-wound Care: Moisturizing lotion 4. Dressing Applied: Other dressing (specify in notes) 5. Secondary Dressing Applied ABD Pad Notes endoform, kerlix and coban wrap Electronic Signature(s) Signed: 09/16/2017 11:50:50 AM By: Secundino Ginger Entered By: Secundino Ginger on 09/16/2017 09:34:01 [name], [name] ([id number]) -------------------------------------------------------------------------------- Wound Assessment Details Patient Name: [name] Date of Service: 09/16/2017 9:15 AM Medical Record Number: [id number] Patient Account Number: [id number] Date of Birth/Sex: 09-14-1940 (76 y.o. M) Treating RN: Secundino Ginger Primary Care Aven Christen: Cyndi Bender Other Clinician: Referring Amylee Lodato: Cyndi Bender Treating Silvino Selman/Extender: Tito Dine in Treatment: 23 Wound Status Wound Number: 8 Primary Arterial Insufficiency Ulcer Etiology: Wound Location: Right [name] - Lateral Wound Open Wounding Event: Gradually Appeared Status: Date Acquired: 04/29/2017 Comorbid Congestive Heart Failure, Coronary Artery Weeks Of Treatment: 20 History: Disease, Hypertension, Myocardial Infarction, Clustered Wound: No Osteoarthritis Photos Photo Uploaded By: Secundino Ginger on 09/16/2017 10:06:43 Wound Measurements Length: (cm) 0.4 Width: (cm) 0.4 Depth: (cm) 0.4 Area: (cm) 0.126 Volume: (cm) 0.05 % Reduction in Area: -77.5% % Reduction in Volume: -614.3% Epithelialization: None Tunneling: No Undermining: No Wound Description Full Thickness With Exposed Support Foul Odor Classification: Structures Slough/Fib Wound Margin: Distinct, outline attached Exudate Small Amount: Exudate Type: Serous Exudate Color: amber After Cleansing: No rino Yes Wound Bed Granulation Amount: None Present (0%) Exposed Structure Necrotic Amount: Large (67-100%) Fascia Exposed: No Necrotic Quality: Adherent  Slough Fat Layer (Subcutaneous Tissue) Exposed: Yes Tendon Exposed: No Muscle Exposed: No Joint Exposed: No Bone Exposed: Yes [name], Jermaine E. ([id number]) Periwound Skin Texture Texture Color No Abnormalities Noted: No No Abnormalities Noted: No Callus: Yes Erythema: Yes Erythema Location: Circumferential Moisture No Abnormalities Noted: No Temperature / Pain Maceration: Yes Temperature: No Abnormality Tenderness on Palpation: Yes Wound Preparation Ulcer Cleansing: Rinsed/Irrigated with Saline Topical Anesthetic Applied: Other: lidocaine 4%, Treatment Notes Wound #8 (Right, Lateral [name]) 1. Cleansed with: Clean wound with Normal Saline 2. Anesthetic Topical Lidocaine 4% cream to wound bed prior to debridement 3. Peri-wound Care: Moisturizing lotion 4. Dressing Applied: Other dressing (specify in notes) 5. Secondary Dressing Applied ABD Pad Notes endoform, kerlix and coban wrap Electronic Signature(s) Signed: 09/16/2017 11:50:50 AM By: Secundino Ginger Entered By: Secundino Ginger on 09/16/2017 09:36:01 [name], [name] ([id number]) -------------------------------------------------------------------------------- Wound Assessment Details Patient Name: [name] Date of Service: 09/16/2017 9:15 AM Medical Record Number: [id number] Patient Account Number: [id number] Date of Birth/Sex: [birth year] (76 y.o. M) Treating RN: Secundino Ginger Primary Care Tomoki Lucken: Cyndi Bender Other Clinician: Referring Antonia Culbertson: Cyndi Bender Treating Seleena Reimers/Extender: Tito Dine in Treatment: 23 Wound Status Wound Number: 9 Primary Pressure Ulcer Etiology: Wound Location: Left Metatarsal head first Wound Open Wounding Event: Gradually Appeared Status: Date Acquired: 05/06/2017 Comorbid Congestive Heart Failure, Coronary Artery Weeks Of Treatment: 17 History: Disease, Hypertension, Myocardial Infarction, Clustered Wound: No Osteoarthritis Photos Photo Uploaded By: Secundino Ginger on 09/16/2017 10:07:34 Wound Measurements Length: (cm) 0.2 Width: (cm) 0.2 Depth: (cm) 0.1 Area: (cm) 0.031 Volume: (cm) 0.003 % Reduction in Area: 84.2% % Reduction in Volume:  85% Epithelialization: None Tunneling: No Undermining: No Wound Description Classification: Category/Stage II Foul Od Wound Margin: Flat and Intact Slough/ Exudate Amount: None Present or After Cleansing: No Fibrino Yes Wound Bed Granulation Amount: Small (1-33%) Exposed Structure Granulation Quality: Pink Fascia Exposed: No Necrotic Amount: Small (1-33%) Fat Layer (Subcutaneous Tissue) Exposed: No Necrotic Quality: Adherent Slough Tendon Exposed: No Muscle Exposed: No Joint Exposed: No Bone Exposed: No Periwound Skin Texture Texture Color No Abnormalities Noted: No No Abnormalities Noted: No [name], Antwain E. ([id number]) Callus: No Atrophie Blanche: No Crepitus: No Cyanosis: No Excoriation: No Ecchymosis: No Induration: No Erythema: No Rash: No Hemosiderin Staining: No Scarring: No Mottled: No Pallor: No Moisture Rubor: No No Abnormalities Noted: No Dry / Scaly: No Temperature / Pain Maceration: Yes Temperature: No Abnormality Wound Preparation Ulcer Cleansing: Rinsed/Irrigated with Saline Topical Anesthetic Applied: Other: lidocaine 4%, Treatment Notes Wound #9 (Left Metatarsal head first) 1. Cleansed with: Clean wound with Normal Saline 2. Anesthetic Topical Lidocaine 4% cream to wound bed prior to debridement 3. Peri-wound Care: Skin Prep 4. Dressing Applied: Aquacel 5. Secondary Dressing Applied Bordered Foam Dressing Electronic Signature(s) Signed: 09/16/2017 11:50:50 AM By: Secundino Ginger Entered By: Secundino Ginger on 09/16/2017 09:38:59 Cappella, [name] ([id number]) -------------------------------------------------------------------------------- Vitals Details Patient Name: [name] Date of Service: 09/16/2017 9:15 AM Medical Record Number:  [id number] Patient Account Number: [id number] Date of Birth/Sex: 30-Nov-1940 (76 y.o. M) Treating RN: Roger Shelter Primary Care Melia Hopes: Cyndi Bender Other Clinician: Referring Reizy Dunlow: Cyndi Bender Treating Lenville Hibberd/Extender: Tito Dine in Treatment: 23 Vital Signs Time Taken: 09:20 Temperature (F): 97.7 Height (in): 68 Pulse (bpm): 77 Respiratory Rate (breaths/min): 16 Blood Pressure (mmHg): 88/52 Reference Range: 80 - 120 mg / dl Electronic Signature(s) Signed: 09/16/2017 1:16:02 PM By: Lorine Bears RCP, RRT, CHT Entered By: Becky Sax, Amado Nash on 09/16/2017 09:25:44

## 2017-09-18 NOTE — Progress Notes (Signed)
[name], [name] ([id number]) Visit Report for 09/16/2017 Debridement Details Patient Name: [name], [name] Date of Service: 09/16/2017 9:15 AM Medical Record Number: [id number] Patient Account Number: [id number] Date of Birth/Sex: 08/08/1940 (77 y.o. M) Treating RN: Roger Shelter Primary Care Provider: Cyndi Bender Other Clinician: Referring Provider: Cyndi Bender Treating Provider/Extender: [name] in Treatment: 23 Debridement Performed for Wound #6 Right,Lateral Malleolus Assessment: Performed By: Physician Jermaine Dillon, [name] Debridement Type: Debridement Severity of Tissue Pre Fat layer exposed Debridement: Pre-procedure Verification/Time Yes - 09:49 Out Taken: Start Time: 09:49 Pain Control: Other : lidocaine 4% Total Area Debrided (L x W): 0.2 (cm) x 0.2 (cm) = 0.04 (cm) Tissue and other material Viable, Non-Viable, Slough, Subcutaneous, Biofilm, Slough debrided: Level: Skin/Subcutaneous Tissue Debridement Description: Excisional Instrument: Curette Bleeding: Minimum Hemostasis Achieved: Pressure End Time: 09:52 Procedural Pain: 0 Post Procedural Pain: 0 Response to Treatment: Procedure was tolerated well Level of Consciousness: Awake and Alert Post Debridement Measurements of Total Wound Length: (cm) 0.2 Width: (cm) 0.2 Depth: (cm) 0.3 Volume: (cm) 0.009 Character of Wound/Ulcer Post Debridement: Stable Severity of Tissue Post Debridement: Fat layer exposed Post Procedure Diagnosis Same as Pre-procedure Electronic Signature(s) Signed: 09/16/2017 5:03:38 PM By: Roger Shelter Signed: 09/16/2017 5:30:50 PM By: [name] Entered By: Jermaine Ham on 09/16/2017 10:18:16 Uhrich, [name] ([id number]) -------------------------------------------------------------------------------- Debridement Details Patient Name: [name] Date of Service: 09/16/2017 9:15 AM Medical Record Number: [id number] Patient Account  Number: [id number] Date of Birth/Sex: 1940-10-02 (77 y.o. M) Treating RN: Roger Shelter Primary Care Provider: Cyndi Bender Other Clinician: Referring Provider: Cyndi Bender Treating Provider/Extender: [name] in Treatment: 23 Debridement Performed for Wound #8 Right,Lateral [name] Assessment: Performed By: Physician Jermaine Dillon, [name] Debridement Type: Debridement Severity of Tissue Pre Fat layer exposed Debridement: Pre-procedure Verification/Time Yes - 09:49 Out Taken: Start Time: 09:49 Pain Control: Other : lidocaine 4% Total Area Debrided (L x W): 0.4 (cm) x 0.4 (cm) = 0.16 (cm) Tissue and other material Viable, Non-Viable, Slough, Subcutaneous, Biofilm, Slough debrided: Level: Skin/Subcutaneous Tissue Debridement Description: Excisional Instrument: Curette Bleeding: Minimum Hemostasis Achieved: Pressure End Time: 09:52 Procedural Pain: 0 Post Procedural Pain: 0 Response to Treatment: Procedure was tolerated well Level of Consciousness: Awake and Alert Post Debridement Measurements of Total Wound Length: (cm) 0.4 Width: (cm) 0.4 Depth: (cm) 0.4 Volume: (cm) 0.05 Character of Wound/Ulcer Post Debridement: Stable Severity of Tissue Post Debridement: Fat layer exposed Post Procedure Diagnosis Same as Pre-procedure Electronic Signature(s) Signed: 09/16/2017 5:03:38 PM By: Roger Shelter Signed: 09/16/2017 5:30:50 PM By: [name] Entered By: Jermaine Ham on 09/16/2017 10:18:31 Anding, [name] ([id number]) -------------------------------------------------------------------------------- HPI Details Patient Name: [name] Date of Service: 09/16/2017 9:15 AM Medical Record Number: [id number] Patient Account Number: [id number] Date of Birth/Sex: [birth year] (77 y.o. M) Treating RN: Roger Shelter Primary Care Provider: Cyndi Bender Other Clinician: Referring Provider: Cyndi Bender Treating Provider/Extender:  [name] in Treatment: 23 History of Present Illness HPI Description: 04/08/17; this is a complex 77 year old man referred here from  vein and vascular. He had been referred there for bilateral lower extremity edema with ulcer formation predominantly on the right calf but also the right [name]. He had been receiving Unna boots bilaterally. The history here is long. He is not a diabetic however ICU looking through late 2018 he was worked up for chronic headaches, elevated inflammatory markers including C-reactive protein and ESR. He went on to actually have a left temporal artery biopsy wasn't that  was negative.he received about 6 weeks of high-dose prednisone 60 mg with improvement in his inflammatory markers. He was admitted to hospital in late November with ventricular tachycardia syncope. He has known ischemic cardiomyopathy. He was admitted in the hospital in mid December. Apparently this was precipitated by a syncopal spell falling out of his scooter while at [place]. There was ventricular arrhythmia. He has an implantable defibrillator and echocardiogram showed severe LV dysfunction with an EF of 20% and valvular regurgitations including mild AR, moderate MR. There was no stenosis. He ruled in for a non-ST elevation MI in the setting of V. tach.his wife states that sometime during this hospitalization she noted multiple areas of skin change on the right lower calf which became evident just after he left the hospital. He was back in hospital in February with acute renal failure hyponatremia. This responded to fluid resuscitation.. Interestingly I can't see much description of his right leg at that point in time.he was followed by Dr. Nehemiah Massed of dermatology for the necrotic wounds on his right leg. Apparently a biopsy was planned at one point but not done although in some notes that suggests it was. I cannot see these results area He was noted to have a lot of edema. Was  treated with bilateral Unna boots edges really helped with the swelling they have been using Bactroban to small open areas predominantly on the right anterior lower leg His history is complicated by the fact that he has rheumatoid arthritis followed by rheumatology. He is followed by neurology for disabling headaches. At one point this was felt to be giant cell arteritis although a left temporal biopsy was apparently negative. He was given a prolonged course of prednisone at 60 mg which managed his sedimentation rates but apparently did not prove improve the headaches. This is been tapered to off on by rheumatology on 03/13/17 Vascular had plans to do a venous reflux workup as well as arterial studies in May. They also wanted to get him a lymphedema pump. As mentioned he's been using bacitracin under Unna boot wraps to both lower legs 04/15/17; the patient arrives with most of his wounds improved. These are small punched out wounds. Most of them remaining ones are on the right anterior calf with the most problematic over the right lateral malleolus. There are no new areas. The symptom complex or potential symptom complex we are dealing with his chronic disabling headaches with inflammatory markers not responsive to prednisone and with a negative temporal biopsy, lower extremity weakness, skin ulcerations just on the right leg. We have managed to get his arterial studies moved up to April 30. He has a rheumatology consult at [facility] in June. He has seen dermatology locally, rheumatology locally, neurology locally. 04/22/17; small punched out areas on the right leg anteriorly posteriorly. Most of these appear to have closed over. Some of them have eschar over the surface. The most problematic area appears to be over the right lateral malleolus. We've been using prisma to all of this. He has arterial studies on April 30 and a rheumatology consult at [facility] on June 28 04/29/17; most of the small punched out areas  on the right leg posteriorly are closed. He has 2 or 3 openings anteriorly but most of these appear to be on the way to closing. Still problematically over the right lateral malleolus and right lateral [name] with almost ischemic-looking eschar. His arterial studies that I ordered are due to be done next week on the 30th so we  should have them available for our next visit hopefully. He also saw a rheumatologist at [facility] and according to the patient he did 8 vials of blood. Finally he has scaling rash on his left [name] and what looks to be at [facility] area on the left anterior leg 05/06/17; most of the small punched out areas on the right leg posteriorly and anteriorly are closed. He still has one small one anteriorly one over the right lateral malleolus and one over the right lateral [name]. SIRIS, HOOS ([id number]) He had his arterial studies they didn't seem to do waveform analysis not exactly sure why however in any case is ABIs were noncompressible bilaterally. They did provide TBIs although looking at the pressures it appears that his TBIs are quite normal. I therefore went ahead and debrided the area over the right lateral malleolus and the right lateral [name] 05/13/17; most of the small punched out areas on the right leg posteriorly and anteriorly are closed. He continues to have problematic areas over the right lateral malleolus and the right lateral [name]. He still requiring aggressive debridement of these 2 wounds using silver collagen I reviewed the note from rheumatology I don't think they came up with a specific diagnosis although he is known to have seropositive RA. They did a panel of lab work when I was able to see his his AMA was negative, anti-smooth muscle antibodies negative antineutrophil cytoplasmic antibodies negative,Liv/kia type 1 negative. Serum C3 and C4 were negative. I don't see his muscle enzymes specifically. He was referred back to his local rheumatologist for management of  his known rheumatoid arthritis.the patient states he still feels weak and fatigued. He states he has numbness in both feet and apparently is known to have neuropathy 05/20/17; the patient continues to have a difficult problem on the right lateral malleolus and the right lateral [name]. Both of these wounds have no viable surface even with attempts at debridement. He has a new wound on the left plantar metatarsal head which looks more like a superficial diabetic pressure related injury then part of this underlying issue he has. Most of the rest of the wounds on his legs look satisfactory. Mostly on the right calf.reviewed his arterial studies which showed noncompressible vessels bilaterally but the TBIs were quite normal. 05/27/17; no real improvement in the right lateral malleolus and right lateral [name]. In fact the right lateral [name] is now on bone. I gave him doxycycline empirically last week it appears that he developed photosensitivity was 78 the son in a tractor. I'll not give him any more of this. This is predominantly on his face and dorsal forearms and hands. I given this more as an anti- inflammatory. I'm going to have him seen by vascular surgery. His TBIs that he had done previously ordered by Dr. Brigitte Pulse were in the normal range They never did full arterial studies on him. he now has small punched out wounds on the right lateral ankle and right lateral [name]. I doubt these are ischemic however I would like a review of his macrovascular status. He does describe some pain at night. I'll reduce the compression from 3 layer to 2 layer and I'm not convinced that this is a macrovascular issue however I want to make sure. We've been using silver alginate 06/03/17; the patient's x-rays that I ordered last time of his right ankle and right lateral [name] did not show definite osteomyelitis. We've been using silver alginate. The wounds are not making any progress. The area  on the plantar left first  metatarsal head however appears to be better. The patient complains of weakness that is more of the fatigue. He says if he's walking his head will fall onto his chest and that his legs literally gave out on him. He did see neurology in the past however that was at a time where his workup was for temporal arteritis and headaches. 06/10/17; the patient is making no progress with the areas on the right lateral [name] and right lateral malleolus. In fact the area on the [name] probes to bone. X-ray did not show osteomyelitis. He went and saw vein and vascular on 06/04/17 he was felt to have significant reflux in the left greater saphenous vein over this is not in the area we are most concerned about. He could be offered ablation. He was not felt to have venous reflux noted in the right lower extremity. He was felt to have some degree of lymphedema and he was felt to be a candidate for compression pumps. Finally a diagnostic arteriogram was suggested which is really what I'm most interested in. The patient has wife wanted time to think about this, I think they were confused about interacting between venous and arterial discussions. I think it would be probably well worth going through the angiogram. Culture I did have the deeper area on the right lateral [name] showed a few Enterococcus faecalis. I would like to start her on amoxicillin which they will start today for one-week 500 3 times a day 06/17/17; the patient still has punched out areas on the right lateral [name] and right lateral malleolus. There is not a viable surface here. We have been using Santyl. He also has an area on the plantar aspect of his left first metatarsal head this also seems to be better 06/24/17; patient's angiogram as on 07/06/17; still has punched out areas on the right lateral [name] and right lateral malleolus to which we've been applying Santyl-based dressings. He also has a more superficial area on the left plantar first  metatarsal head, using collagen here 07/08/17; the patient had his angiogram. This perineal artery had a 70-80% stenosis. Similarly the posterior tibial artery also was diseased. Furthermore down the posterior tibial artery in the distal segment was a short stenosis of 80%. His major vessels in his thigh and only minor irregularity. He had percutaneous angioplasty of the proximal right peroneal artery. Also angioplasty of the tibial peroneal trunk and proximal posterior tibial artery as well as the mid to distal segment of the posterior tibial artery. He handled this remarkably well. The patient arrived with a new wound on his right anterior calf which I think is the reopening of one of the original small open areas 07/15/17; the areas on his right anterior calf is new. He has a new threatened area on the right tibia more superiorly which is not open yet but makes me wonder whether this is going to reopen as well. His original wounds on the right lateral [name] and right lateral malleolus are somewhat better in terms of surface [name], Jermaine E. ([id number]) 07/22/17; the patient has a second open area on the right anterior. This was a threatened area superiorly last week. Each time this happens she has a small wound with a nephrotic cover. The areas on the right lateral [name] and right lateral malleolus are about the same. I did not attempt debridement in any of these today continuing with Santyl. oThe area on the left second metatarsal head looks better  and he is healed laceration injuries on the arm from a fall last week with only the ventral forearm wound left 08/05/17 08/05/17; 2 week follow-up. This is a patient who initially presented with a multitude of small punched out areas on the right anterior calf greater than left dorsal [name]. This was in the setting of a constitutional illness at which time he saw multiple specialists was worked up for various rheumatologic diseases including temporal  arteritis. Most of the areas on the right leg and a few on the left actually closed over however he he developed 2 small deep probing areas on the right lateral [name] and right lateral malleolus. He also has an area on the left plantar first metatarsal head which is gradually been getting better. He was revascularized by Dr. dew about 4 weeks ago. He went to see Dr. Nehemiah Massed on 07/30/17; from review the note in time to the patient there wasn't an obvious cause. He did do a shave biopsy of the right anterior leg ulcer rule out cancer or other etiologies such as some embolic. This looks like some form of vasculopathy to me although trying to explain why it so much worse on the right leg than the left is been difficult. We will await the biopsy results. Dr. Nehemiah Massed wanted to put Bactroban and this not sure what that will do for the areas that have a completely nonviable surface. I'd like to go back to [place]. 08/19/17-He is here in follow up evaluation for multiple ulcerations to the right lower extremity and right [name] and the left planter first metatarsal head ulcer. He has had biopsy to the right lateral proximal leg; no results available for review, patient and spouse report "negative" biopsy. We will continue with current treatment plan, using ace wrap compression to the right leg as his 15-43mHg compression stockings are providing suboptimal edema control. He will follow up in two weeks 09/02/17; the patient's biopsy done by dermatology/Dr. KNehemiah Massedwas apparently "negative". He has 2 open areas on the right calf one was the biopsy site on the right lateral calf. Most problematic wounds are on the right lateral malleolus and on the right lateral [name]. Both of these have some depth we've been using Santyl here. Also now a small open area remaining on the left plantar [name] 09/16/17 ; every week follow-up. The patient has been using Santyl to the area on the right lateral ankle and right lateral  [name]. All the areas on the right calf including the biopsy site are closed. He still has the area on the first metatarsal head on the left. We've been using collagen to this as well Electronic Signature(s) Signed: 09/16/2017 5:30:50 PM By: RLinton HamMD Entered By: RLinton Hamon 09/16/2017 10:19:44 [name], [name]([id number] -------------------------------------------------------------------------------- Physical Exam Details Patient Name: MJessie FootDate of Service: 09/16/2017 9:15 AM Medical Record Number: [id number] Account Number: [id number] of Birth/Sex: 1Apr 01, 1942(77 y.o. M) Treating RN: FRoger ShelterPrimary Care Provider: CCyndi BenderOther Clinician: Referring Provider: CCyndi BenderTreating Provider/Extender: RTito Dinein Treatment: 23 Constitutional Patient is hypotensive. although his blood pressures normally run 1122systolic at home. Pulse regular and within target range for patient..Marland KitchenRespirations regular, non-labored and within target range.. Temperature is normal and within the target range for the patient..Marland Kitchenappears in no distress.he does not appear to be systemically unwell. Respiratory Respiratory effort is easy and symmetric bilaterally. Rate is normal at rest and on room air.. Bilateral breath sounds  are clear and equal in all lobes with no wheezes, rales or rhonchi.. Cardiovascular 2/6 pansystolic murmur at the PMI radiating into the exam left compatible with mitral regurg. Psychiatric he is mentating normally. Notes wound exam oThe 2 open areas on the right calf are both close oThe area on the right lateral malleolus and right lateral [name] still required debridement of adherent debris from the small punched out wound surfaces however this cleans up easily. There is no evidence of surrounding cellulitis oThe area on the first left MTP is small superficial but somewhat moist Electronic Signature(s) Signed:  09/16/2017 5:30:50 PM By: [name] Entered By: Jermaine Ham on 09/16/2017 10:22:03 [name], [name] ([id number]) -------------------------------------------------------------------------------- Physician Orders Details Patient Name: [name] Date of Service: 09/16/2017 9:15 AM Medical Record Number: [id number] Patient Account Number: [id number] Date of Birth/Sex: April 17, 1940 (77 y.o. M) Treating RN: Roger Shelter Primary Care Provider: Cyndi Bender Other Clinician: Referring Provider: Cyndi Bender Treating Provider/Extender: [name] in Treatment: 33 Verbal / Phone Orders: No Diagnosis Coding Wound Cleansing Wound #6 Right,Lateral Malleolus o Clean wound with Normal Saline. Wound #8 Right,Lateral [name] o Clean wound with Normal Saline. Wound #9 Left Metatarsal head first o Clean wound with Normal Saline. Anesthetic (add to Medication List) Wound #6 Right,Lateral Malleolus o Topical Lidocaine 4% cream applied to wound bed prior to debridement (In Clinic Only). Wound #8 Right,Lateral [name] o Topical Lidocaine 4% cream applied to wound bed prior to debridement (In Clinic Only). Wound #9 Left Metatarsal head first o Topical Lidocaine 4% cream applied to wound bed prior to debridement (In Clinic Only). Primary Wound Dressing Wound #6 Right,Lateral Malleolus o Other: - endoform Wound #8 Right,Lateral [name] o Other: - endoform Wound #9 Left Metatarsal head first o Other: - calcium alginate Secondary Dressing Wound #6 Right,Lateral Malleolus o ABD pad o Conform/Kerlix Wound #8 Right,Lateral [name] o ABD pad o Conform/Kerlix Wound #9 Left Metatarsal head first o Boardered Foam Dressing Dressing Change Frequency Wound #6 Right,Lateral Malleolus [name], [name] ([id number]) o Change Dressing Monday, Wednesday, Friday Wound #8 Right,Lateral [name] o Change Dressing Monday, Wednesday, Friday Wound #9 Left  Metatarsal head first o Change Dressing Monday, Wednesday, Friday Follow-up Appointments Wound #6 Right,Lateral Malleolus o Return Appointment in 2 weeks. Wound #8 Right,Lateral [name] o Return Appointment in 2 weeks. Wound #9 Left Metatarsal head first o Return Appointment in 2 weeks. Edema Control Wound #6 Right,Lateral Malleolus o Kerlix and Coban - Right Lower Extremity Wound #8 Right,Lateral [name] o Kerlix and Coban - Right Lower Extremity Home Health Wound #6 [place] Nurse may visit PRN to address patientos wound care needs. o FACE TO FACE ENCOUNTER: MEDICARE and MEDICAID PATIENTS: I certify that this patient is under my care and that I had a face-to-face encounter that meets the physician face-to-face encounter requirements with this patient on this date. The encounter with the patient was in whole or in part for the following MEDICAL CONDITION: (primary reason for [place]) MEDICAL NECESSITY: I certify, that based on my findings, NURSING services are a medically necessary home health service. HOME BOUND STATUS: I certify that my clinical findings support that this patient is homebound (i.e., Due to illness or injury, pt requires aid of supportive devices such as crutches, cane, wheelchairs, walkers, the use of special transportation or the assistance of another person to leave their place of residence. There is a normal inability to leave the home and doing  so requires considerable and taxing effort. Other absences are for medical reasons / religious services and are infrequent or of short duration when for other reasons). o If current dressing causes regression in wound condition, may D/C ordered dressing product/s and apply Normal Saline Moist Dressing daily until next [place] / Other [name] appointment. [place] of regression in wound condition at [phone]. o  Please direct any NON-WOUND related issues/requests for orders to patient's Primary Care Physician Wound #8 [place] Nurse may visit PRN to address patientos wound care needs. o FACE TO FACE ENCOUNTER: MEDICARE and MEDICAID PATIENTS: I certify that this patient is under my care and that I had a face-to-face encounter that meets the physician face-to-face encounter requirements with this patient on this date. The encounter with the patient was in whole or in part for the following MEDICAL CONDITION: (primary reason for [place]) MEDICAL NECESSITY: I certify, that based on my findings, NURSING services are a medically necessary home health service. HOME BOUND STATUS: I certify that my clinical findings support that this patient is homebound (i.e., Due to illness or injury, pt requires aid of supportive devices such as crutches, cane, wheelchairs, walkers, the use of special transportation or the assistance of another person to leave their place of residence. There is a normal inability to leave the home and doing so requires considerable and taxing effort. Other absences are for medical reasons / religious services and are infrequent or of short duration when for other reasons). DAYMEON, FISCHMAN ([id number]) o If current dressing causes regression in wound condition, may D/C ordered dressing product/s and apply Normal Saline Moist Dressing daily until next [place] / Other [name] appointment. [place] of regression in wound condition at [phone]. o Please direct any NON-WOUND related issues/requests for orders to patient's Primary Care Physician Wound #9 Left Metatarsal head first o [place] Nurse may visit PRN to address patientos wound care needs. o FACE TO FACE ENCOUNTER: MEDICARE and MEDICAID PATIENTS: I certify that this patient is under my care and  that I had a face-to-face encounter that meets the physician face-to-face encounter requirements with this patient on this date. The encounter with the patient was in whole or in part for the following MEDICAL CONDITION: (primary reason for [place]) MEDICAL NECESSITY: I certify, that based on my findings, NURSING services are a medically necessary home health service. HOME BOUND STATUS: I certify that my clinical findings support that this patient is homebound (i.e., Due to illness or injury, pt requires aid of supportive devices such as crutches, cane, wheelchairs, walkers, the use of special transportation or the assistance of another person to leave their place of residence. There is a normal inability to leave the home and doing so requires considerable and taxing effort. Other absences are for medical reasons / religious services and are infrequent or of short duration when for other reasons). o If current dressing causes regression in wound condition, may D/C ordered dressing product/s and apply Normal Saline Moist Dressing daily until next [place] / Other [name] appointment. [place] of regression in wound condition at [phone]. o Please direct any NON-WOUND related issues/requests for orders to patient's Primary Care Physician Electronic Signature(s) Signed: 09/16/2017 5:03:38 PM By: Roger Shelter Signed: 09/16/2017 5:30:50 PM By: [name] Entered By: Roger Shelter on 09/16/2017 [place], [name] ([id number]) --------------------------------------------------------------------------------  Problem List Details Patient Name: JAYR, LUPERCIO Date of Service: 09/16/2017 9:15 AM Medical Record Number: [id number] Patient Account Number: [id number] Date of Birth/Sex: Jan 07, 1941 (77 y.o. 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 of 07/08/2017 No Yes other part of [name] I25.5 Ischemic cardiomyopathy 04/08/2017 No Yes Inactive Problems ICD-10 Code Description Active Date Inactive Date L97.212 Non-pressure chronic ulcer of right calf with fat layer exposed 04/08/2017 04/08/2017 Resolved Problems ICD-10 Code Description Active Date Resolved Date S41.111D Laceration without foreign body of right upper arm, subsequent 07/15/2017 07/15/2017 encounter Electronic Signature(s) Signed: 09/16/2017 5:30:50 PM By: [name] Entered By: Jermaine Ham on 09/16/2017 10:16:28 [name], [name] ([id number]) -------------------------------------------------------------------------------- Progress Note Details Patient Name: [name] Date of Service: 09/16/2017 9:15 AM Medical Record Number: [id number] Patient Account Number: [id number] Date of Birth/Sex: 1940-04-15 (77 y.o. M) Treating RN: Roger Shelter Primary Care Provider: Cyndi Bender Other Clinician: Referring Provider: Cyndi Bender Treating Provider/Extender: [name] in Treatment: 23 Subjective History of Present Illness (HPI) 04/08/17; this is a complex 76 year old man referred here from [facility]. He had been referred there for bilateral lower extremity edema with ulcer formation predominantly on the right calf but also the right [name]. He had been receiving Unna boots bilaterally. The history here is long. He is not a diabetic however ICU looking through late 2018 he was worked up for  chronic headaches, elevated inflammatory markers including C-reactive protein and ESR. He went on to actually have a left temporal artery biopsy wasn't that was negative.he received about 6 weeks of high-dose prednisone 60 mg with improvement in his inflammatory markers. He was admitted to hospital in late November with ventricular tachycardia syncope. He has known ischemic cardiomyopathy. He was admitted in the hospital in mid December. Apparently this was precipitated by a syncopal spell falling out of his scooter while at [place]. There was ventricular arrhythmia. He has an implantable defibrillator and echocardiogram showed severe LV dysfunction with an EF of 20% and valvular regurgitations including mild AR, moderate MR. There was no stenosis. He ruled in for a non-ST elevation MI in the setting of V. tach.his wife states that sometime during this hospitalization she noted multiple areas of skin change on the right lower calf which became evident just after he left the hospital. He was back in hospital in February with acute renal failure hyponatremia. This responded to fluid resuscitation.. Interestingly I can't see much description of his right leg at that point in time.he was followed by Dr. Nehemiah Massed of dermatology for the necrotic wounds on his right leg. Apparently a biopsy was planned at one point but not done although in some notes that suggests it was. I cannot see these results area He was noted to have a lot of edema. Was treated with bilateral Unna boots edges really helped with the swelling they have been using Bactroban to small open areas predominantly on the right anterior lower leg His history is complicated by the fact that he has rheumatoid arthritis followed by rheumatology. He is followed by neurology  for disabling headaches. At one point this was felt to be giant cell arteritis although a left temporal biopsy was apparently negative. He was given a prolonged course of  prednisone at 60 mg which managed his sedimentation rates but apparently did not prove improve the headaches. This is been tapered to off on by rheumatology on 03/13/17 Vascular had plans to do a venous reflux workup as well as arterial studies in May. They also wanted to get him a lymphedema pump. As mentioned he's been using bacitracin under Unna boot wraps to both lower legs 04/15/17; the patient arrives with most of his wounds improved. These are small punched out wounds. Most of them remaining ones are on the right anterior calf with the most problematic over the right lateral malleolus. There are no new areas. The symptom complex or potential symptom complex we are dealing with his chronic disabling headaches with inflammatory markers not responsive to prednisone and with a negative temporal biopsy, lower extremity weakness, skin ulcerations just on the right leg. We have managed to get his arterial studies moved up to April 30. He has a rheumatology consult at [facility] in June. He has seen dermatology locally, rheumatology locally, neurology locally. 04/22/17; small punched out areas on the right leg anteriorly posteriorly. Most of these appear to have closed over. Some of them have eschar over the surface. The most problematic area appears to be over the right lateral malleolus. We've been using prisma to all of this. He has arterial studies on April 30 and a rheumatology consult at [facility] on June 28 04/29/17; most of the small punched out areas on the right leg posteriorly are closed. He has 2 or 3 openings anteriorly but most of these appear to be on the way to closing. Still problematically over the right lateral malleolus and right lateral [name] with almost ischemic-looking eschar. His arterial studies that I ordered are due to be done next week on the 30th so we should have them available for our next visit hopefully. He also saw a rheumatologist at [facility] and according to the patient he did 8 vials  of blood. Finally he has scaling rash on his left [name] and what looks to be at [facility] area on the left anterior leg 05/06/17; most of the small punched out areas on the right leg posteriorly and anteriorly are closed. He still has one small one Gerads, Grayland E. ([id number]) anteriorly one over the right lateral malleolus and one over the right lateral [name]. He had his arterial studies they didn't seem to do waveform analysis not exactly sure why however in any case is ABIs were noncompressible bilaterally. They did provide TBIs although looking at the pressures it appears that his TBIs are quite normal. I therefore went ahead and debrided the area over the right lateral malleolus and the right lateral [name] 05/13/17; most of the small punched out areas on the right leg posteriorly and anteriorly are closed. He continues to have problematic areas over the right lateral malleolus and the right lateral [name]. He still requiring aggressive debridement of these 2 wounds using silver collagen I reviewed the note from rheumatology I don't think they came up with a specific diagnosis although he is known to have seropositive RA. They did a panel of lab work when I was able to see his his AMA was negative, anti-smooth muscle antibodies negative antineutrophil cytoplasmic antibodies negative,Liv/kia type 1 negative. Serum C3 and C4 were negative. I don't see his muscle enzymes specifically. He  was referred back to his local rheumatologist for management of his known rheumatoid arthritis.the patient states he still feels weak and fatigued. He states he has numbness in both feet and apparently is known to have neuropathy 05/20/17; the patient continues to have a difficult problem on the right lateral malleolus and the right lateral [name]. Both of these wounds have no viable surface even with attempts at debridement. He has a new wound on the left plantar metatarsal head which looks more like a superficial diabetic  pressure related injury then part of this underlying issue he has. Most of the rest of the wounds on his legs look satisfactory. Mostly on the right calf.reviewed his arterial studies which showed noncompressible vessels bilaterally but the TBIs were quite normal. 05/27/17; no real improvement in the right lateral malleolus and right lateral [name]. In fact the right lateral [name] is now on bone. I gave him doxycycline empirically last week it appears that he developed photosensitivity was 11 the son in a tractor. I'll not give him any more of this. This is predominantly on his face and dorsal forearms and hands. I given this more as an anti- inflammatory. I'm going to have him seen by vascular surgery. His TBIs that he had done previously ordered by Dr. Brigitte Pulse were in the normal range They never did full arterial studies on him. he now has small punched out wounds on the right lateral ankle and right lateral [name]. I doubt these are ischemic however I would like a review of his macrovascular status. He does describe some pain at night. I'll reduce the compression from 3 layer to 2 layer and I'm not convinced that this is a macrovascular issue however I want to make sure. We've been using silver alginate 06/03/17; the patient's x-rays that I ordered last time of his right ankle and right lateral [name] did not show definite osteomyelitis. We've been using silver alginate. The wounds are not making any progress. The area on the plantar left first metatarsal head however appears to be better. The patient complains of weakness that is more of the fatigue. He says if he's walking his head will fall onto his chest and that his legs literally gave out on him. He did see neurology in the past however that was at a time where his workup was for temporal arteritis and headaches. 06/10/17; the patient is making no progress with the areas on the right lateral [name] and right lateral malleolus. In fact the area on the  [name] probes to bone. X-ray did not show osteomyelitis. He went and saw vein and vascular on 06/04/17 he was felt to have significant reflux in the left greater saphenous vein over this is not in the area we are most concerned about. He could be offered ablation. He was not felt to have venous reflux noted in the right lower extremity. He was felt to have some degree of lymphedema and he was felt to be a candidate for compression pumps. Finally a diagnostic arteriogram was suggested which is really what I'm most interested in. The patient has wife wanted time to think about this, I think they were confused about interacting between venous and arterial discussions. I think it would be probably well worth going through the angiogram. Culture I did have the deeper area on the right lateral [name] showed a few Enterococcus faecalis. I would like to start her on amoxicillin which they will start today for one-week 500 3 times a day 06/17/17; the patient  still has punched out areas on the right lateral [name] and right lateral malleolus. There is not a viable surface here. We have been using Santyl. He also has an area on the plantar aspect of his left first metatarsal head this also seems to be better 06/24/17; patient's angiogram as on 07/06/17; still has punched out areas on the right lateral [name] and right lateral malleolus to which we've been applying Santyl-based dressings. He also has a more superficial area on the left plantar first metatarsal head, using collagen here 07/08/17; the patient had his angiogram. This perineal artery had a 70-80% stenosis. Similarly the posterior tibial artery also was diseased. Furthermore down the posterior tibial artery in the distal segment was a short stenosis of 80%. His major vessels in his thigh and only minor irregularity. He had percutaneous angioplasty of the proximal right peroneal artery. Also angioplasty of the tibial peroneal trunk and proximal posterior tibial  artery as well as the mid to distal segment of the posterior tibial artery. He handled this remarkably well. The patient arrived with a new wound on his right anterior calf which I think is the reopening of one of the original small open areas 07/15/17; the areas on his right anterior calf is new. He has a new threatened area on the right tibia more superiorly which is not open yet but makes me wonder whether this is going to reopen as well. His original wounds on the right lateral [name] and [name], Jermaine E. ([id number]) right lateral malleolus are somewhat better in terms of surface 07/22/17; the patient has a second open area on the right anterior. This was a threatened area superiorly last week. Each time this happens she has a small wound with a nephrotic cover. The areas on the right lateral [name] and right lateral malleolus are about the same. I did not attempt debridement in any of these today continuing with Santyl. The area on the left second metatarsal head looks better and he is healed laceration injuries on the arm from a fall last week with only the ventral forearm wound left 08/05/17 08/05/17; 2 week follow-up. This is a patient who initially presented with a multitude of small punched out areas on the right anterior calf greater than left dorsal [name]. This was in the setting of a constitutional illness at which time he saw multiple specialists was worked up for various rheumatologic diseases including temporal arteritis. Most of the areas on the right leg and a few on the left actually closed over however he he developed 2 small deep probing areas on the right lateral [name] and right lateral malleolus. He also has an area on the left plantar first metatarsal head which is gradually been getting better. He was revascularized by Dr. dew about 4 weeks ago. He went to see Dr. Nehemiah Massed on 07/30/17; from review the note in time to the patient there wasn't an obvious cause. He did do a shave  biopsy of the right anterior leg ulcer rule out cancer or other etiologies such as some embolic. This looks like some form of vasculopathy to me although trying to explain why it so much worse on the right leg than the left is been difficult. We will await the biopsy results. Dr. Nehemiah Massed wanted to put Bactroban and this not sure what that will do for the areas that have a completely nonviable surface. I'd like to go back to [place]. 08/19/17-He is here in follow up evaluation for multiple ulcerations to the right  lower extremity and right [name] and the left planter first metatarsal head ulcer. He has had biopsy to the right lateral proximal leg; no results available for review, patient and spouse report "negative" biopsy. We will continue with current treatment plan, using ace wrap compression to the right leg as his 15-40mHg compression stockings are providing suboptimal edema control. He will follow up in two weeks 09/02/17; the patient's biopsy done by dermatology/Dr. KNehemiah Massedwas apparently "negative". He has 2 open areas on the right calf one was the biopsy site on the right lateral calf. Most problematic wounds are on the right lateral malleolus and on the right lateral [name]. Both of these have some depth we've been using Santyl here. Also now a small open area remaining on the left plantar [name] 09/16/17 ; every week follow-up. The patient has been using Santyl to the area on the right lateral ankle and right lateral [name]. All the areas on the right calf including the biopsy site are closed. He still has the area on the first metatarsal head on the left. We've been using collagen to this as well Objective Constitutional Patient is hypotensive. although his blood pressures normally run 1263systolic at home. Pulse regular and within target range for patient..Marland KitchenRespirations regular, non-labored and within target range.. Temperature is normal and within the target range for the patient..Marland Kitchenappears in  no distress.he does not appear to be systemically unwell. Vitals Time Taken: 9:20 AM, Height: 68 in, Temperature: 97.7 F, Pulse: 77 bpm, Respiratory Rate: 16 breaths/min, Blood Pressure: 88/52 mmHg. Respiratory Respiratory effort is easy and symmetric bilaterally. Rate is normal at rest and on room air.. Bilateral breath sounds are clear and equal in all lobes with no wheezes, rales or rhonchi..Marland KitchenMMAXINE, HUYNH([id number] Cardiovascular 2/6 pansystolic murmur at the PMI radiating into the exam left compatible with mitral regurg. Psychiatric he is mentating normally. General Notes: wound exam The 2 open areas on the right calf are both close The area on the right lateral malleolus and right lateral [name] still required debridement of adherent debris from the small punched out wound surfaces however this cleans up easily. There is no evidence of surrounding cellulitis The area on the first left MTP is small superficial but somewhat moist Integumentary (Hair, Skin) Wound #15 status is Healed - Epithelialized. Original cause of wound was Gradually Appeared. The wound is located on the Right,Proximal,Anterior Lower Leg. The wound measures 0cm length x 0cm width x 0cm depth; 0cm^2 area and 0cm^3 volume. There is no tunneling or undermining noted. There is a none present amount of drainage noted. The wound margin is distinct with the outline attached to the wound base. There is no granulation within the wound bed. There is no necrotic tissue within the wound bed. The periwound skin appearance had no abnormalities noted for texture. The periwound skin appearance had no abnormalities noted for color. The periwound skin appearance did not exhibit: Dry/Scaly, Maceration. Periwound temperature was noted as No Abnormality. Wound #16 status is Healed - Epithelialized. Original cause of wound was Gradually Appeared. The wound is located on the Right,Lateral Lower Leg. The wound measures 0cm length x 0cm  width x 0cm depth; 0cm^2 area and 0cm^3 volume. There is no tunneling or undermining noted. There is a none present amount of drainage noted. The wound margin is distinct with the outline attached to the wound base. There is no granulation within the wound bed. There is no necrotic tissue within the wound bed. The periwound  skin appearance had no abnormalities noted for texture. The periwound skin appearance had no abnormalities noted for color. The periwound skin appearance did not exhibit: Maceration. Periwound temperature was noted as No Abnormality. The periwound has tenderness on palpation. Wound #6 status is Open. Original cause of wound was Gradually Appeared. The wound is located on the Right,Lateral Malleolus. The wound measures 0.2cm length x 0.2cm width x 0.3cm depth; 0.031cm^2 area and 0.009cm^3 volume. There is Fat Layer (Subcutaneous Tissue) Exposed exposed. There is no tunneling or undermining noted. There is a small amount of serous drainage noted. The wound margin is distinct with the outline attached to the wound base. There is no granulation within the wound bed. There is a large (67-100%) amount of necrotic tissue within the wound bed including Adherent Slough. The periwound skin appearance exhibited: Callus, Erythema. The periwound skin appearance did not exhibit: Crepitus, Excoriation, Induration, Rash, Scarring, Dry/Scaly, Maceration, Atrophie Blanche, Cyanosis, Ecchymosis, Hemosiderin Staining, Mottled, Pallor, Rubor. The surrounding wound skin color is noted with erythema which is circumferential. Periwound temperature was noted as No Abnormality. The periwound has tenderness on palpation. Wound #8 status is Open. Original cause of wound was Gradually Appeared. The wound is located on the Right,Lateral [name]. The wound measures 0.4cm length x 0.4cm width x 0.4cm depth; 0.126cm^2 area and 0.05cm^3 volume. There is bone and Fat Layer (Subcutaneous Tissue) Exposed exposed. There  is no tunneling or undermining noted. There is a small amount of serous drainage noted. The wound margin is distinct with the outline attached to the wound base. There is no granulation within the wound bed. There is a large (67-100%) amount of necrotic tissue within the wound bed including Adherent Slough. The periwound skin appearance exhibited: Callus, Maceration, Erythema. The surrounding wound skin color is noted with erythema which is circumferential. Periwound temperature was noted as No Abnormality. The periwound has tenderness on palpation. Wound #9 status is Open. Original cause of wound was Gradually Appeared. The wound is located on the Left Metatarsal head first. The wound measures 0.2cm length x 0.2cm width x 0.1cm depth; 0.031cm^2 area and 0.003cm^3 volume. There is no tunneling or undermining noted. There is a none present amount of drainage noted. The wound margin is flat and intact. There is small (1-33%) pink granulation within the wound bed. There is a small (1-33%) amount of necrotic tissue within the wound bed including Adherent Slough. The periwound skin appearance exhibited: Maceration. The periwound skin appearance did not exhibit: Callus, Crepitus, Excoriation, Induration, Rash, Scarring, Dry/Scaly, Atrophie Blanche, Cyanosis, Ecchymosis, Hemosiderin Staining, Mottled, Pallor, Rubor, Erythema. Periwound temperature was noted as No Abnormality. 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 There  was a Excisional Skin/Subcutaneous Tissue Debridement with a total area of 0.04 sq cm performed by Jermaine Dillon, [name]. With the following instrument(s): Curette to remove Viable and Non-Viable tissue/material. Material removed includes Subcutaneous Tissue, Slough, and Biofilm after achieving pain control using Other (lidocaine 4%). No specimens were taken. A time out was conducted at 09:49, prior to the start of the procedure. A Minimum amount of bleeding was controlled  with Pressure. The procedure was tolerated well with a pain level of 0 throughout and a pain level of 0 following the procedure. Patient s Level of Consciousness post procedure was recorded as Awake and Alert. Post Debridement Measurements: 0.2cm length x 0.2cm width x 0.3cm depth; 0.009cm^3 volume. Character of Wound/Ulcer Post Debridement is stable. Severity of Tissue Post Debridement is: Fat layer exposed. Post procedure Diagnosis Wound #6: Same as Pre-Procedure Wound #8 Pre-procedure diagnosis of Wound #8 is an Arterial Insufficiency Ulcer located on the Right,Lateral [name] .Severity of Tissue Pre Debridement is: Fat layer exposed. There was a Excisional Skin/Subcutaneous Tissue Debridement with a total area of 0.16 sq cm performed by Jermaine Dillon, [name]. With the following instrument(s): Curette to remove Viable and Non- Viable tissue/material. Material removed includes Subcutaneous Tissue, Slough, and Biofilm after achieving pain control using Other (lidocaine 4%). No specimens were taken. A time out was conducted at 09:49, prior to the start of the procedure. A Minimum amount of bleeding was controlled with Pressure. The procedure was tolerated well with a pain level of 0 throughout and a pain level of 0 following the procedure. Patient s Level of Consciousness post procedure was recorded as Awake and Alert. Post Debridement Measurements: 0.4cm length x 0.4cm width x 0.4cm depth; 0.05cm^3 volume. Character of Wound/Ulcer  Post Debridement is stable. Severity of Tissue Post Debridement is: Fat layer exposed. Post procedure Diagnosis Wound #8: Same as Pre-Procedure Plan Wound Cleansing: Wound #6 Right,Lateral Malleolus: Clean wound with Normal Saline. Wound #8 Right,Lateral [name]: Clean wound with Normal Saline. Wound #9 Left Metatarsal head first: Clean wound with Normal Saline. Anesthetic (add to Medication List): Wound #6 Right,Lateral Malleolus: Rewerts, Declyn E. ([id number]) Topical Lidocaine 4% cream applied to wound bed prior to debridement (In Clinic Only). Wound #8 Right,Lateral [name]: Topical Lidocaine 4% cream applied to wound bed prior to debridement (In Clinic Only). Wound #9 Left Metatarsal head first: Topical Lidocaine 4% cream applied to wound bed prior to debridement (In Clinic Only). Primary Wound Dressing: Wound #6 Right,Lateral Malleolus: Other: - endoform Wound #8 Right,Lateral [name]: Other: - endoform Wound #9 Left Metatarsal head first: Other: - calcium alginate Secondary Dressing: Wound #6 Right,Lateral Malleolus: ABD pad Conform/Kerlix Wound #8 Right,Lateral [name]: ABD pad Conform/Kerlix Wound #9 Left Metatarsal head first: Boardered Foam Dressing Dressing Change Frequency: Wound #6 Right,Lateral Malleolus: Change Dressing Monday, Wednesday, Friday Wound #8 Right,Lateral [name]: Change Dressing Monday, Wednesday, Friday Wound #9 Left Metatarsal head first: Change Dressing Monday, Wednesday, Friday Follow-up Appointments: Wound #6 Right,Lateral Malleolus: Return Appointment in 2 weeks. Wound #8 Right,Lateral [name]: Return Appointment in 2 weeks. Wound #9 Left Metatarsal head first: Return Appointment in 2 weeks. Edema Control: Wound #6 Right,Lateral Malleolus: Kerlix and Coban - Right Lower Extremity Wound #8 Right,Lateral [name]: Kerlix and Coban - Right Lower Extremity Home Health: Wound #6 Right,Lateral Malleolus: [place] Nurse may  visit PRN to address patient s wound care needs. FACE TO FACE ENCOUNTER: MEDICARE and MEDICAID PATIENTS: I certify that this patient is under my care and that I had a face-to-face encounter that meets the physician face-to-face encounter requirements with this patient on this date. The encounter with the patient was in whole or in part for the following MEDICAL CONDITION: (primary reason for [place]) MEDICAL NECESSITY: I certify, that based on my findings, NURSING services are a medically necessary home health service. HOME BOUND STATUS: I certify that my clinical findings support that this patient is homebound (i.e., Due  to illness or injury, pt requires aid of supportive devices such as crutches, cane, wheelchairs, walkers, the use of special transportation or the assistance of another person to leave their place of residence. There is a normal inability to leave the home and doing so requires considerable and taxing effort. Other absences are for medical reasons / religious services and are infrequent or of short duration when for other reasons). If current dressing causes regression in wound condition, may D/C ordered dressing product/s and apply Normal Saline Moist Dressing daily until next [place] / Other [name] appointment. [place] of regression in wound condition at [phone]. Please direct any NON-WOUND related issues/requests for orders to patient's Primary Care Physician Wound #8 Right,Lateral [name]: [place] Nurse may visit PRN to address patient s wound care needs. CORDARREL, STIEFEL ([id number]) FACE TO FACE ENCOUNTER: MEDICARE and MEDICAID PATIENTS: I certify that this patient is under my care and that I had a face-to-face encounter that meets the physician face-to-face encounter requirements with this patient on this date. The encounter with the patient was in whole or in part for the following MEDICAL CONDITION:  (primary reason for [place]) MEDICAL NECESSITY: I certify, that based on my findings, NURSING services are a medically necessary home health service. HOME BOUND STATUS: I certify that my clinical findings support that this patient is homebound (i.e., Due to illness or injury, pt requires aid of supportive devices such as crutches, cane, wheelchairs, walkers, the use of special transportation or the assistance of another person to leave their place of residence. There is a normal inability to leave the home and doing so requires considerable and taxing effort. Other absences are for medical reasons / religious services and are infrequent or of short duration when for other reasons). If current dressing causes regression in wound condition, may D/C ordered dressing product/s and apply Normal Saline Moist Dressing daily until next [place] / Other [name] appointment. [place] of regression in wound condition at [phone]. Please direct any NON-WOUND related issues/requests for orders to patient's Primary Care Physician Wound #9 Left Metatarsal head first: [place] Nurse may visit PRN to address patient s wound care needs. FACE TO FACE ENCOUNTER: MEDICARE and MEDICAID PATIENTS: I certify that this patient is under my care and that I had a face-to-face encounter that meets the physician face-to-face encounter requirements with this patient on this date. The encounter with the patient was in whole or in part for the following MEDICAL CONDITION: (primary reason for [place]) MEDICAL NECESSITY: I certify, that based on my findings, NURSING services are a medically necessary home health service. HOME BOUND STATUS: I certify that my clinical findings support that this patient is homebound (i.e., Due to illness or injury, pt requires aid of supportive devices such as crutches, cane, wheelchairs, walkers, the use of  special transportation or the assistance of another person to leave their place of residence. There is a normal inability to leave the home and doing so requires considerable and taxing effort. Other absences are for medical reasons / religious services and are infrequent or of short duration when for other reasons). If current dressing causes regression in wound condition, may D/C ordered dressing product/s and apply Normal Saline Moist Dressing daily until next [place] / Other [name] appointment. [place] of regression in wound condition at [phone]. Please direct any NON-WOUND related issues/requests for orders  to patient's Primary Care Physician #1no form to both areas on the right lateral ankle and right lateral [name] change this every second or third day with home health assistance #2 I think the left first metatarsal head just needs an alginate it looked a little moist however this is filled and I'm hopeful to close this out without further offloading Electronic Signature(s) Signed: 09/16/2017 5:30:50 PM By: [name] Entered By: Jermaine Ham on 09/16/2017 10:23:00 Sullenberger, [name] ([id number]) -------------------------------------------------------------------------------- SuperBill Details Patient Name: [name] Date of Service: 09/16/2017 Medical Record Number: [id number] Patient Account Number: [id number] Date of Birth/Sex: Feb 11, 1940 (77 y.o. 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  of other part of [name] I25.5 Ischemic cardiomyopathy L97.312 Non-pressure chronic ulcer of right ankle with fat layer exposed Facility Procedures CPT4 Code: 53029506 Description: [place] - DEB SUBQ TISSUE 20 SQ CM/< ICD-10 Diagnosis Description L97.512 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 with fat l L97.312 Non-pressure chronic ulcer of right ankle with fat layer exposed Modifier: ayer exposed Quantity: 1 Physician Procedures CPT4 Code: 4628805 Description: 11042 - WC PHYS SUBQ TISS 20 SQ CM ICD-10 Diagnosis Description L97.512 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 with fat l L97.312 Non-pressure chronic ulcer of right ankle with fat layer exposed Modifier: ayer exposed Quantity: 1 Electronic Signature(s) Signed: 09/16/2017 5:30:50 PM By: [name] Entered By: Jermaine Ham on 09/16/2017 10:24:08

## 2017-09-21 DIAGNOSIS — I110 Hypertensive heart disease with heart failure: Secondary | ICD-10-CM | POA: Diagnosis not present

## 2017-09-21 DIAGNOSIS — L97311 Non-pressure chronic ulcer of right ankle limited to breakdown of skin: Secondary | ICD-10-CM | POA: Diagnosis not present

## 2017-09-21 DIAGNOSIS — L97511 Non-pressure chronic ulcer of other part of right foot limited to breakdown of skin: Secondary | ICD-10-CM | POA: Diagnosis not present

## 2017-09-21 DIAGNOSIS — I5023 Acute on chronic systolic (congestive) heart failure: Secondary | ICD-10-CM | POA: Diagnosis not present

## 2017-09-21 DIAGNOSIS — Z48817 Encounter for surgical aftercare following surgery on the skin and subcutaneous tissue: Secondary | ICD-10-CM | POA: Diagnosis not present

## 2017-09-21 DIAGNOSIS — I2510 Atherosclerotic heart disease of native coronary artery without angina pectoris: Secondary | ICD-10-CM | POA: Diagnosis not present

## 2017-09-23 DIAGNOSIS — Z48817 Encounter for surgical aftercare following surgery on the skin and subcutaneous tissue: Secondary | ICD-10-CM | POA: Diagnosis not present

## 2017-09-23 DIAGNOSIS — L97511 Non-pressure chronic ulcer of other part of right foot limited to breakdown of skin: Secondary | ICD-10-CM | POA: Diagnosis not present

## 2017-09-23 DIAGNOSIS — I110 Hypertensive heart disease with heart failure: Secondary | ICD-10-CM | POA: Diagnosis not present

## 2017-09-23 DIAGNOSIS — I2510 Atherosclerotic heart disease of native coronary artery without angina pectoris: Secondary | ICD-10-CM | POA: Diagnosis not present

## 2017-09-23 DIAGNOSIS — I5023 Acute on chronic systolic (congestive) heart failure: Secondary | ICD-10-CM | POA: Diagnosis not present

## 2017-09-23 DIAGNOSIS — L97311 Non-pressure chronic ulcer of right ankle limited to breakdown of skin: Secondary | ICD-10-CM | POA: Diagnosis not present

## 2017-09-25 DIAGNOSIS — L97311 Non-pressure chronic ulcer of right ankle limited to breakdown of skin: Secondary | ICD-10-CM | POA: Diagnosis not present

## 2017-09-25 DIAGNOSIS — I5023 Acute on chronic systolic (congestive) heart failure: Secondary | ICD-10-CM | POA: Diagnosis not present

## 2017-09-25 DIAGNOSIS — Z48817 Encounter for surgical aftercare following surgery on the skin and subcutaneous tissue: Secondary | ICD-10-CM | POA: Diagnosis not present

## 2017-09-25 DIAGNOSIS — I110 Hypertensive heart disease with heart failure: Secondary | ICD-10-CM | POA: Diagnosis not present

## 2017-09-25 DIAGNOSIS — L97511 Non-pressure chronic ulcer of other part of right foot limited to breakdown of skin: Secondary | ICD-10-CM | POA: Diagnosis not present

## 2017-09-25 DIAGNOSIS — I2510 Atherosclerotic heart disease of native coronary artery without angina pectoris: Secondary | ICD-10-CM | POA: Diagnosis not present

## 2017-09-29 DIAGNOSIS — L97311 Non-pressure chronic ulcer of right ankle limited to breakdown of skin: Secondary | ICD-10-CM | POA: Diagnosis not present

## 2017-09-29 DIAGNOSIS — Z48817 Encounter for surgical aftercare following surgery on the skin and subcutaneous tissue: Secondary | ICD-10-CM | POA: Diagnosis not present

## 2017-09-29 DIAGNOSIS — I110 Hypertensive heart disease with heart failure: Secondary | ICD-10-CM | POA: Diagnosis not present

## 2017-09-29 DIAGNOSIS — I5023 Acute on chronic systolic (congestive) heart failure: Secondary | ICD-10-CM | POA: Diagnosis not present

## 2017-09-29 DIAGNOSIS — L97511 Non-pressure chronic ulcer of other part of right foot limited to breakdown of skin: Secondary | ICD-10-CM | POA: Diagnosis not present

## 2017-09-29 DIAGNOSIS — I2510 Atherosclerotic heart disease of native coronary artery without angina pectoris: Secondary | ICD-10-CM | POA: Diagnosis not present

## 2017-09-30 ENCOUNTER — Encounter: Payer: Medicare Other | Admitting: Internal Medicine

## 2017-09-30 DIAGNOSIS — I70233 Atherosclerosis of native arteries of right leg with ulceration of ankle: Secondary | ICD-10-CM | POA: Diagnosis not present

## 2017-09-30 DIAGNOSIS — I252 Old myocardial infarction: Secondary | ICD-10-CM | POA: Diagnosis not present

## 2017-09-30 DIAGNOSIS — L97512 Non-pressure chronic ulcer of other part of right foot with fat layer exposed: Secondary | ICD-10-CM | POA: Diagnosis not present

## 2017-09-30 DIAGNOSIS — I255 Ischemic cardiomyopathy: Secondary | ICD-10-CM | POA: Diagnosis not present

## 2017-09-30 DIAGNOSIS — I2510 Atherosclerotic heart disease of native coronary artery without angina pectoris: Secondary | ICD-10-CM | POA: Diagnosis not present

## 2017-09-30 DIAGNOSIS — L97521 Non-pressure chronic ulcer of other part of left foot limited to breakdown of skin: Secondary | ICD-10-CM | POA: Diagnosis not present

## 2017-09-30 DIAGNOSIS — I70235 Atherosclerosis of native arteries of right leg with ulceration of other part of foot: Secondary | ICD-10-CM | POA: Diagnosis not present

## 2017-09-30 DIAGNOSIS — L97312 Non-pressure chronic ulcer of right ankle with fat layer exposed: Secondary | ICD-10-CM | POA: Diagnosis not present

## 2017-10-02 DIAGNOSIS — L97511 Non-pressure chronic ulcer of other part of right foot limited to breakdown of skin: Secondary | ICD-10-CM | POA: Diagnosis not present

## 2017-10-02 DIAGNOSIS — I110 Hypertensive heart disease with heart failure: Secondary | ICD-10-CM | POA: Diagnosis not present

## 2017-10-02 DIAGNOSIS — Z48817 Encounter for surgical aftercare following surgery on the skin and subcutaneous tissue: Secondary | ICD-10-CM | POA: Diagnosis not present

## 2017-10-02 DIAGNOSIS — I2510 Atherosclerotic heart disease of native coronary artery without angina pectoris: Secondary | ICD-10-CM | POA: Diagnosis not present

## 2017-10-02 DIAGNOSIS — I5023 Acute on chronic systolic (congestive) heart failure: Secondary | ICD-10-CM | POA: Diagnosis not present

## 2017-10-02 DIAGNOSIS — L97311 Non-pressure chronic ulcer of right ankle limited to breakdown of skin: Secondary | ICD-10-CM | POA: Diagnosis not present

## 2017-10-02 NOTE — Progress Notes (Signed)
[name], [name] ([id number]) Visit Report for 09/30/2017 Debridement Details Patient Name: [name], [name] Date of Service: 09/30/2017 8:30 AM Medical Record Number: [id number] Patient Account Number: [id number] Date of Birth/Sex: 02/05/1940 (77 y.o. M) Treating RN: Cornell Barman Primary Care Provider: Cyndi Bender Other Clinician: Referring Provider: Cyndi Bender Treating Provider/Extender: Tito Dine in Treatment: 25 Debridement Performed for Wound #6 Right,Lateral Malleolus Assessment: Performed By: Physician Ricard Dillon, MD Debridement Type: Debridement Severity of Tissue Pre Fat layer exposed Debridement: Level of Consciousness (Pre- Awake and Alert procedure): Pre-procedure Verification/Time Yes - 09:05 Out Taken: Start Time: 09:06 Pain Control: Other : lidocaine 4% Total Area Debrided (L x W): 0.4 (cm) x 0.4 (cm) = 0.16 (cm) Tissue and other material Viable, Non-Viable, Slough, Subcutaneous, Slough debrided: Level: Skin/Subcutaneous Tissue Debridement Description: Excisional Instrument: Curette Bleeding: Minimum Hemostasis Achieved: Pressure End Time: 09:09 Procedural Pain: 0 Post Procedural Pain: 0 Response to Treatment: Procedure was tolerated well Level of Consciousness Awake and Alert (Post-procedure): Post Debridement Measurements of Total Wound Length: (cm) 0.4 Width: (cm) 0.4 Depth: (cm) 0.3 Volume: (cm) 0.038 Character of Wound/Ulcer Post Debridement: Requires Further Debridement Severity of Tissue Post Debridement: Fat layer exposed Post Procedure Diagnosis Same as Pre-procedure Electronic Signature(s) Signed: 09/30/2017 5:31:05 PM By: Gretta Cool, BSN, RN, CWS, Kim RN, BSN Signed: 09/30/2017 6:15:06 PM By: Linton Ham MD Entered By: Linton Ham on 09/30/2017 09:18:25 [name], [name] ([id number]) DAMARIUS, KARNES  ([id number]) -------------------------------------------------------------------------------- Debridement Details Patient Name: [name] Date of Service: 09/30/2017 8:30 AM Medical Record Number: [id number] Patient Account Number: [id number] Date of Birth/Sex: 11/25/1940 (77 y.o. M) Treating RN: Cornell Barman Primary Care Provider: Cyndi Bender Other Clinician: Referring Provider: Cyndi Bender Treating Provider/Extender: Tito Dine in Treatment: 25 Debridement Performed for Wound #8 Right,Lateral [name] Assessment: Performed By: Physician Ricard Dillon, MD Debridement Type: Debridement Severity of Tissue Pre Fat layer exposed Debridement: Level of Consciousness (Pre- Awake and Alert procedure): Pre-procedure Verification/Time Yes - 09:05 Out Taken: Start Time: 09:06 Pain Control: Other : lidocaine 4% Total Area Debrided (L x W): 0.4 (cm) x 0.4 (cm) = 0.16 (cm) Tissue and other material Viable, Non-Viable, Slough, Subcutaneous, Slough debrided: Level: Skin/Subcutaneous Tissue Debridement Description: Excisional Instrument: Curette Bleeding: Minimum Hemostasis Achieved: Pressure End Time: 09:09 Procedural Pain: 0 Post Procedural Pain: 0 Response to Treatment: Procedure was tolerated well Level of Consciousness Awake and Alert (Post-procedure): Post Debridement Measurements of Total Wound Length: (cm) 0.4 Width: (cm) 0.4 Depth: (cm) 0.4 Volume: (cm) 0.05 Character of Wound/Ulcer Post Debridement: Requires Further Debridement Severity of Tissue Post Debridement: Fat layer exposed Post Procedure Diagnosis Same as Pre-procedure Electronic Signature(s) Signed: 09/30/2017 5:31:05 PM By: Gretta Cool, BSN, RN, CWS, Kim RN, BSN Signed: 09/30/2017 6:15:06 PM By: Linton Ham MD Entered By: Linton Ham on 09/30/2017 09:18:38 [name], [name] ([id number]) -------------------------------------------------------------------------------- HPI  Details Patient Name: [name] Date of Service: 09/30/2017 8:30 AM Medical Record Number: [id number] Patient Account Number: [id number] Date of Birth/Sex: Mar 23, 1940 (77 y.o. M) Treating RN: Cornell Barman Primary Care Provider: Cyndi Bender Other Clinician: Referring Provider: Cyndi Bender Treating Provider/Extender: Tito Dine in Treatment: 25 History of Present Illness HPI Description: 04/08/17; this is a complex 77 year old man referred here from [facility]. He had been referred there for bilateral lower extremity edema with ulcer formation predominantly on the right calf but also the right [name]. He had been receiving Unna boots bilaterally. The history here is long. He is not a diabetic  however ICU looking through late 2018 he was worked up for chronic headaches, elevated inflammatory markers including C-reactive protein and ESR. He went on to actually have a left temporal artery biopsy wasn't that was negative.he received about 6 weeks of high-dose prednisone 60 mg with improvement in his inflammatory markers. He was admitted to hospital in late November with ventricular tachycardia syncope. He has known ischemic cardiomyopathy. He was admitted in the hospital in mid December. Apparently this was precipitated by a syncopal spell falling out of his scooter while at [place]. There was ventricular arrhythmia. He has an implantable defibrillator and echocardiogram showed severe LV dysfunction with an EF of 20% and valvular regurgitations including mild AR, moderate MR. There was no stenosis. He ruled in for a non-ST elevation MI in the setting of V. tach.his wife states that sometime during this hospitalization she noted multiple areas of skin change on the right lower calf which became evident just after he left the hospital. He was back in hospital in February with acute renal failure hyponatremia. This responded to fluid resuscitation.. Interestingly I  can't see much description of his right leg at that point in time.he was followed by Dr. Nehemiah Massed of dermatology for the necrotic wounds on his right leg. Apparently a biopsy was planned at one point but not done although in some notes that suggests it was. I cannot see these results area He was noted to have a lot of edema. Was treated with bilateral Unna boots edges really helped with the swelling they have been using Bactroban to small open areas predominantly on the right anterior lower leg His history is complicated by the fact that he has rheumatoid arthritis followed by rheumatology. He is followed by neurology for disabling headaches. At one point this was felt to be giant cell arteritis although a left temporal biopsy was apparently negative. He was given a prolonged course of prednisone at 60 mg which managed his sedimentation rates but apparently did not prove improve the headaches. This is been tapered to off on by rheumatology on 03/13/17 Vascular had plans to do a venous reflux workup as well as arterial studies in May. They also wanted to get him a lymphedema pump. As mentioned he's been using bacitracin under Unna boot wraps to both lower legs 04/15/17; the patient arrives with most of his wounds improved. These are small punched out wounds. Most of them remaining ones are on the right anterior calf with the most problematic over the right lateral malleolus. There are no new areas. The symptom complex or potential symptom complex we are dealing with his chronic disabling headaches with inflammatory markers not responsive to prednisone and with a negative temporal biopsy, lower extremity weakness, skin ulcerations just on the right leg. We have managed to get his arterial studies moved up to April 30. He has a rheumatology consult at [facility] in June. He has seen dermatology locally, rheumatology locally, neurology locally. 04/22/17; small punched out areas on the right leg anteriorly  posteriorly. Most of these appear to have closed over. Some of them have eschar over the surface. The most problematic area appears to be over the right lateral malleolus. We've been using prisma to all of this. He has arterial studies on April 30 and a rheumatology consult at [facility] on June 28 04/29/17; most of the small punched out areas on the right leg posteriorly are closed. He has 2 or 3 openings anteriorly but most of these appear to be on the way to  closing. Still problematically over the right lateral malleolus and right lateral [name] with almost ischemic-looking eschar. His arterial studies that I ordered are due to be done next week on the 30th so we should have them available for our next visit hopefully. He also saw a rheumatologist at [facility] and according to the patient he did 8 vials of blood. Finally he has scaling rash on his left [name] and what looks to be at [facility] area on the left anterior leg 05/06/17; most of the small punched out areas on the right leg posteriorly and anteriorly are closed. He still has one small one anteriorly one over the right lateral malleolus and one over the right lateral [name]. JORDYN, [name] ([id number]) He had his arterial studies they didn't seem to do waveform analysis not exactly sure why however in any case is ABIs were noncompressible bilaterally. They did provide TBIs although looking at the pressures it appears that his TBIs are quite normal. I therefore went ahead and debrided the area over the right lateral malleolus and the right lateral [name] 05/13/17; most of the small punched out areas on the right leg posteriorly and anteriorly are closed. He continues to have problematic areas over the right lateral malleolus and the right lateral [name]. He still requiring aggressive debridement of these 2 wounds using silver collagen I reviewed the note from rheumatology I don't think they came up with a specific diagnosis although he is known to  have seropositive RA. They did a panel of lab work when I was able to see his his AMA was negative, anti-smooth muscle antibodies negative antineutrophil cytoplasmic antibodies negative,Liv/kia type 1 negative. Serum C3 and C4 were negative. I don't see his muscle enzymes specifically. He was referred back to his local rheumatologist for management of his known rheumatoid arthritis.the patient states he still feels weak and fatigued. He states he has numbness in both feet and apparently is known to have neuropathy 05/20/17; the patient continues to have a difficult problem on the right lateral malleolus and the right lateral [name]. Both of these wounds have no viable surface even with attempts at debridement. He has a new wound on the left plantar metatarsal head which looks more like a superficial diabetic pressure related injury then part of this underlying issue he has. Most of the rest of the wounds on his legs look satisfactory. Mostly on the right calf.reviewed his arterial studies which showed noncompressible vessels bilaterally but the TBIs were quite normal. 05/27/17; no real improvement in the right lateral malleolus and right lateral [name]. In fact the right lateral [name] is now on bone. I gave him doxycycline empirically last week it appears that he developed photosensitivity was 66 the son in a tractor. I'll not give him any more of this. This is predominantly on his face and dorsal forearms and hands. I given this more as an anti- inflammatory. I'm going to have him seen by vascular surgery. His TBIs that he had done previously ordered by Dr. Brigitte Pulse were in the normal range They never did full arterial studies on him. he now has small punched out wounds on the right lateral ankle and right lateral [name]. I doubt these are ischemic however I would like a review of his macrovascular status. He does describe some pain at night. I'll reduce the compression from 3 layer to 2 layer and I'm not  convinced that this is a macrovascular issue however I want to make sure. We've been using silver alginate 06/03/17;  the patient's x-rays that I ordered last time of his right ankle and right lateral [name] did not show definite osteomyelitis. We've been using silver alginate. The wounds are not making any progress. The area on the plantar left first metatarsal head however appears to be better. The patient complains of weakness that is more of the fatigue. He says if he's walking his head will fall onto his chest and that his legs literally gave out on him. He did see neurology in the past however that was at a time where his workup was for temporal arteritis and headaches. 06/10/17; the patient is making no progress with the areas on the right lateral [name] and right lateral malleolus. In fact the area on the [name] probes to bone. X-ray did not show osteomyelitis. He went and saw vein and vascular on 06/04/17 he was felt to have significant reflux in the left greater saphenous vein over this is not in the area we are most concerned about. He could be offered ablation. He was not felt to have venous reflux noted in the right lower extremity. He was felt to have some degree of lymphedema and he was felt to be a candidate for compression pumps. Finally a diagnostic arteriogram was suggested which is really what I'm most interested in. The patient has wife wanted time to think about this, I think they were confused about interacting between venous and arterial discussions. I think it would be probably well worth going through the angiogram. Culture I did have the deeper area on the right lateral [name] showed a few Enterococcus faecalis. I would like to start her on amoxicillin which they will start today for one-week 500 3 times a day 06/17/17; the patient still has punched out areas on the right lateral [name] and right lateral malleolus. There is not a viable surface here. We have been using Santyl. He also  has an area on the plantar aspect of his left first metatarsal head this also seems to be better 06/24/17; patient's angiogram as on 07/06/17; still has punched out areas on the right lateral [name] and right lateral malleolus to which we've been applying Santyl-based dressings. He also has a more superficial area on the left plantar first metatarsal head, using collagen here 07/08/17; the patient had his angiogram. This perineal artery had a 70-80% stenosis. Similarly the posterior tibial artery also was diseased. Furthermore down the posterior tibial artery in the distal segment was a short stenosis of 80%. His major vessels in his thigh and only minor irregularity. He had percutaneous angioplasty of the proximal right peroneal artery. Also angioplasty of the tibial peroneal trunk and proximal posterior tibial artery as well as the mid to distal segment of the posterior tibial artery. He handled this remarkably well. The patient arrived with a new wound on his right anterior calf which I think is the reopening of one of the original small open areas 07/15/17; the areas on his right anterior calf is new. He has a new threatened area on the right tibia more superiorly which is not open yet but makes me wonder whether this is going to reopen as well. His original wounds on the right lateral [name] and right lateral malleolus are somewhat better in terms of surface [name], Jermaine E. ([id number]) 07/22/17; the patient has a second open area on the right anterior. This was a threatened area superiorly last week. Each time this happens she has a small wound with a nephrotic cover. The areas on the  right lateral [name] and right lateral malleolus are about the same. I did not attempt debridement in any of these today continuing with Santyl. oThe area on the left second metatarsal head looks better and he is healed laceration injuries on the arm from a fall last week with only the ventral forearm wound  left 08/05/17 08/05/17; 2 week follow-up. This is a patient who initially presented with a multitude of small punched out areas on the right anterior calf greater than left dorsal [name]. This was in the setting of a constitutional illness at which time he saw multiple specialists was worked up for various rheumatologic diseases including temporal arteritis. Most of the areas on the right leg and a few on the left actually closed over however he he developed 2 small deep probing areas on the right lateral [name] and right lateral malleolus. He also has an area on the left plantar first metatarsal head which is gradually been getting better. He was revascularized by Dr. dew about 4 weeks ago. He went to see Dr. Nehemiah Massed on 07/30/17; from review the note in time to the patient there wasn't an obvious cause. He did do a shave biopsy of the right anterior leg ulcer rule out cancer or other etiologies such as some embolic. This looks like some form of vasculopathy to me although trying to explain why it so much worse on the right leg than the left is been difficult. We will await the biopsy results. Dr. Nehemiah Massed wanted to put Bactroban and this not sure what that will do for the areas that have a completely nonviable surface. I'd like to go back to [place]. 08/19/17-He is here in follow up evaluation for multiple ulcerations to the right lower extremity and right [name] and the left planter first metatarsal head ulcer. He has had biopsy to the right lateral proximal leg; no results available for review, patient and spouse report "negative" biopsy. We will continue with current treatment plan, using ace wrap compression to the right leg as his 15-42mHg compression stockings are providing suboptimal edema control. He will follow up in two weeks 09/02/17; the patient's biopsy done by dermatology/Dr. KNehemiah Massedwas apparently "negative". He has 2 open areas on the right calf one was the biopsy site on the right lateral  calf. Most problematic wounds are on the right lateral malleolus and on the right lateral [name]. Both of these have some depth we've been using Santyl here. Also now a small open area remaining on the left plantar [name] 09/16/17 ; every week follow-up. The patient has been using Santyl to the area on the right lateral ankle and right lateral [name]. All the areas on the right calf including the biopsy site are closed. He still has the area on the first metatarsal head on the left. We've been using collagen to this as well oBiweekly visit. The area on the left first metatarsal head is just about closed. We have a new tape injury on the dorsal left [name] that was caused by her nurse taking off his dressing. This is superficial. oHe still has difficult areas on the right lateral [name] and right lateral malleolus. He been using Endoform. I was hoping we were going to see better results than today. Still required debridement. I'm going to put Oasis through his i[employer]ician Signed: 09/30/2017 6:15:06 PM By: RLinton HamMD Entered By: RLinton Hamon 09/30/2017 09:20:36 [name], [name]([id number] -------------------------------------------------------------------------------- Physical Exam Details Patient Name: MJessie FootDate of Service:  09/30/2017 8:30 AM Medical Record Number: [id number] Patient Account Number: [id number] Date of Birth/Sex: 1940/11/12 (76 y.o. M) Treating RN: Cornell Barman Primary Care Provider: Cyndi Bender Other Clinician: Referring Provider: Cyndi Bender Treating Provider/Extender: Tito Dine in Treatment: 25 Cardiovascular Pedal pulses are palpable right and left. He's been revascularized on the right.. Notes wound exam oThe 2 open areas on the right lateral malleolus and the right dorsal [name] have necrotic debris that I removed with a #3 curet. This is a bit disappointing after we cleaned these up with Santyl. He's now been  using Endoform. Electronic Signature(s) Signed: 09/30/2017 6:15:06 PM By: Linton Ham MD Entered By: Linton Ham on 09/30/2017 09:22:11 [name], [name] ([id number]) -------------------------------------------------------------------------------- Physician Orders Details Patient Name: [name] Date of Service: 09/30/2017 8:30 AM Medical Record Number: [id number] Patient Account Number: [id number] Date of Birth/Sex: 1940-02-27 (76 y.o. M) Treating RN: Cornell Barman Primary Care Provider: Cyndi Bender Other Clinician: Referring Provider: Cyndi Bender Treating Provider/Extender: Tito Dine in Treatment: 82 Verbal / Phone Orders: No Diagnosis Coding Wound Cleansing Wound #17 Left,Plantar [name] o Clean wound with Normal Saline. Wound #6 Right,Lateral Malleolus o Clean wound with Normal Saline. Wound #8 Right,Lateral [name] o Clean wound with Normal Saline. Wound #9 Left Metatarsal head first o Clean wound with Normal Saline. Anesthetic (add to Medication List) Wound #17 Left,Plantar [name] o Topical Lidocaine 4% cream applied to wound bed prior to debridement (In Clinic Only). Wound #6 Right,Lateral Malleolus o Topical Lidocaine 4% cream applied to wound bed prior to debridement (In Clinic Only). Wound #8 Right,Lateral [name] o Topical Lidocaine 4% cream applied to wound bed prior to debridement (In Clinic Only). Wound #9 Left Metatarsal head first o Topical Lidocaine 4% cream applied to wound bed prior to debridement (In Clinic Only). Primary Wound Dressing Wound #6 Right,Lateral Malleolus o Other: - endoform Wound #8 Right,Lateral [name] o Other: - endoform Wound #9 Left Metatarsal head first o Alginate Secondary Dressing Wound #17 Left,Plantar [name] o ABD pad o Conform/Kerlix Wound #6 Right,Lateral Malleolus o ABD pad o Conform/Kerlix [name], [name] E. ([id number]) Wound #8 Right,Lateral [name] o ABD pad o  Conform/Kerlix Wound #9 Left Metatarsal head first o ABD pad o Conform/Kerlix Dressing Change Frequency Wound #17 Left,Plantar [name] o Change Dressing Monday, Wednesday, Friday Wound #6 Right,Lateral Malleolus o Change Dressing Monday, Wednesday, Friday Wound #8 Right,Lateral [name] o Change Dressing Monday, Wednesday, Friday Wound #9 Left Metatarsal head first o Change Dressing Monday, Wednesday, Friday Follow-up Appointments Wound #17 Left,Plantar [name] o Return Appointment in 2 weeks. Wound #6 Right,Lateral Malleolus o Return Appointment in 2 weeks. Wound #8 Right,Lateral [name] o Return Appointment in 2 weeks. Wound #9 Left Metatarsal head first o Return Appointment in 2 weeks. Home Health Wound #17 [place] Visits o Home Health Nurse may visit PRN to address patientos wound care needs. o FACE TO FACE ENCOUNTER: MEDICARE and MEDICAID PATIENTS: I certify that this patient is under my care and that I had a face-to-face encounter that meets the physician face-to-face encounter requirements with this patient on this date. The encounter with the patient was in whole or in part for the following MEDICAL CONDITION: (primary reason for [place]) MEDICAL NECESSITY: I certify, that based on my findings, NURSING services are a medically necessary home health service. HOME BOUND STATUS: I certify that my clinical findings support that this patient is homebound (i.e., Due to illness or injury, pt requires aid of supportive devices such  as crutches, cane, wheelchairs, walkers, the use of special transportation or the assistance of another person to leave their place of residence. There is a normal inability to leave the home and doing so requires considerable and taxing effort. Other absences are for medical reasons / religious services and are infrequent or of short duration when for other reasons). o If current dressing causes  regression in wound condition, may D/C ordered dressing product/s and apply Normal Saline Moist Dressing daily until next [place] / Other MD appointment. [place] of regression in wound condition at [phone]. o Please direct any NON-WOUND related issues/requests for orders to patient's Primary Care Physician Wound #6 [place] Visits ZEBULON, GANTT ([id number]) o [place] Nurse may visit PRN to address patientos wound care needs. o FACE TO FACE ENCOUNTER: MEDICARE and MEDICAID PATIENTS: I certify that this patient is under my care and that I had a face-to-face encounter that meets the physician face-to-face encounter requirements with this patient on this date. The encounter with the patient was in whole or in part for the following MEDICAL CONDITION: (primary reason for [place]) MEDICAL NECESSITY: I certify, that based on my findings, NURSING services are a medically necessary home health service. HOME BOUND STATUS: I certify that my clinical findings support that this patient is homebound (i.e., Due to illness or injury, pt requires aid of supportive devices such as crutches, cane, wheelchairs, walkers, the use of special transportation or the assistance of another person to leave their place of residence. There is a normal inability to leave the home and doing so requires considerable and taxing effort. Other absences are for medical reasons / religious services and are infrequent or of short duration when for other reasons). o If current dressing causes regression in wound condition, may D/C ordered dressing product/s and apply Normal Saline Moist Dressing daily until next [place] / Other MD appointment. [place] of regression in wound condition at [phone]. o Please direct any NON-WOUND related issues/requests for orders to patient's Primary Care  Physician Wound #8 [place] Nurse may visit PRN to address patientos wound care needs. o FACE TO FACE ENCOUNTER: MEDICARE and MEDICAID PATIENTS: I certify that this patient is under my care and that I had a face-to-face encounter that meets the physician face-to-face encounter requirements with this patient on this date. The encounter with the patient was in whole or in part for the following MEDICAL CONDITION: (primary reason for [place]) MEDICAL NECESSITY: I certify, that based on my findings, NURSING services are a medically necessary home health service. HOME BOUND STATUS: I certify that my clinical findings support that this patient is homebound (i.e., Due to illness or injury, pt requires aid of supportive devices such as crutches, cane, wheelchairs, walkers, the use of special transportation or the assistance of another person to leave their place of residence. There is a normal inability to leave the home and doing so requires considerable and taxing effort. Other absences are for medical reasons / religious services and are infrequent or of short duration when for other reasons). o If current dressing causes regression in wound condition, may D/C ordered dressing product/s and apply Normal Saline Moist Dressing daily until next [place] / Other MD appointment. [place] of regression in wound condition at [phone]. o Please direct any NON-WOUND related issues/requests for orders to patient's Primary  Care Physician Wound #9 Left Metatarsal head first o [place] Nurse may visit PRN to address patientos wound care needs. o FACE TO FACE ENCOUNTER: MEDICARE and MEDICAID PATIENTS: I certify that this patient is under my care and that I had a face-to-face encounter that meets the physician face-to-face encounter requirements with this patient on  this date. The encounter with the patient was in whole or in part for the following MEDICAL CONDITION: (primary reason for [place]) MEDICAL NECESSITY: I certify, that based on my findings, NURSING services are a medically necessary home health service. HOME BOUND STATUS: I certify that my clinical findings support that this patient is homebound (i.e., Due to illness or injury, pt requires aid of supportive devices such as crutches, cane, wheelchairs, walkers, the use of special transportation or the assistance of another person to leave their place of residence. There is a normal inability to leave the home and doing so requires considerable and taxing effort. Other absences are for medical reasons / religious services and are infrequent or of short duration when for other reasons). o If current dressing causes regression in wound condition, may D/C ordered dressing product/s and apply Normal Saline Moist Dressing daily until next [place] / Other MD appointment. [place] of regression in wound condition at [phone]. o Please direct any NON-WOUND related issues/requests for orders to patient's Primary Care Physician Notes [place] for [employer]. Electronic Signature(s) Signed: 09/30/2017 5:31:05 PM By: Gretta Cool, BSN, RN, CWS, Kim RN, BSN Rollins, DOZIER BERKOVICH ([id number]) Signed: 09/30/2017 6:15:06 PM By: Linton Ham MD Entered By: Gretta Cool, BSN, RN, CWS, Kim on 09/30/2017 09:11:44 Buttacavoli, [name] ([id number]) -------------------------------------------------------------------------------- Problem List Details Patient Name: [name] Date of Service: 09/30/2017 8:30 AM Medical Record Number: [id number] Patient Account Number: [id number] Date of Birth/Sex: 1940/02/19 (76 y.o. 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 of 07/08/2017 No Yes other part of [name] I25.5 Ischemic cardiomyopathy 04/08/2017 No Yes Inactive Problems ICD-10 Code Description Active Date Inactive Date L97.212 Non-pressure chronic ulcer of right calf with fat layer exposed 04/08/2017 04/08/2017 Resolved Problems ICD-10 Code Description Active Date Resolved Date S41.111D Laceration without foreign body of right upper arm, subsequent 07/15/2017 07/15/2017 encounter Electronic Signature(s) Signed: 09/30/2017 6:15:06 PM By: Linton Ham MD Entered By: Linton Ham on 09/30/2017 09:13:06 [name], [name] ([id number]) -------------------------------------------------------------------------------- Progress Note Details Patient Name: [name] Date of Service: 09/30/2017 8:30 AM Medical Record Number: [id number] Patient Account Number: [id number] Date of Birth/Sex: 09/07/1940 (76 y.o. M) Treating RN: Cornell Barman Primary Care Provider: Cyndi Bender Other Clinician: Referring Provider: Cyndi Bender Treating Provider/Extender: Tito Dine in Treatment: 25 Subjective History of Present Illness (HPI) 04/08/17; this is a complex 77 year old man referred here from [facility]. He had been referred there for bilateral lower extremity edema with ulcer formation predominantly on the right calf but also the right [name]. He had been receiving Unna boots bilaterally. The history here is long. He is not a diabetic however ICU looking through  late 2018 he was worked up for chronic headaches, elevated inflammatory  markers including C-reactive protein and ESR. He went on to actually have a left temporal artery biopsy wasn't that was negative.he received about 6 weeks of high-dose prednisone 60 mg with improvement in his inflammatory markers. He was admitted to hospital in late November with ventricular tachycardia syncope. He has known ischemic cardiomyopathy. He was admitted in the hospital in mid December. Apparently this was precipitated by a syncopal spell falling out of his scooter while at [place]. There was ventricular arrhythmia. He has an implantable defibrillator and echocardiogram showed severe LV dysfunction with an EF of 20% and valvular regurgitations including mild AR, moderate MR. There was no stenosis. He ruled in for a non-ST elevation MI in the setting of V. tach.his wife states that sometime during this hospitalization she noted multiple areas of skin change on the right lower calf which became evident just after he left the hospital. He was back in hospital in February with acute renal failure hyponatremia. This responded to fluid resuscitation.. Interestingly I can't see much description of his right leg at that point in time.he was followed by Dr. Nehemiah Massed of dermatology for the necrotic wounds on his right leg. Apparently a biopsy was planned at one point but not done although in some notes that suggests it was. I cannot see these results area He was noted to have a lot of edema. Was treated with bilateral Unna boots edges really helped with the swelling they have been using Bactroban to small open areas predominantly on the right anterior lower leg His history is complicated by the fact that he has rheumatoid arthritis followed by rheumatology. He is followed by neurology for disabling headaches. At one point this was felt to be giant cell arteritis although a left temporal biopsy was apparently negative. He was given a prolonged course of prednisone at 60 mg which managed his  sedimentation rates but apparently did not prove improve the headaches. This is been tapered to off on by rheumatology on 03/13/17 Vascular had plans to do a venous reflux workup as well as arterial studies in May. They also wanted to get him a lymphedema pump. As mentioned he's been using bacitracin under Unna boot wraps to both lower legs 04/15/17; the patient arrives with most of his wounds improved. These are small punched out wounds. Most of them remaining ones are on the right anterior calf with the most problematic over the right lateral malleolus. There are no new areas. The symptom complex or potential symptom complex we are dealing with his chronic disabling headaches with inflammatory markers not responsive to prednisone and with a negative temporal biopsy, lower extremity weakness, skin ulcerations just on the right leg. We have managed to get his arterial studies moved up to April 30. He has a rheumatology consult at [facility] in June. He has seen dermatology locally, rheumatology locally, neurology locally. 04/22/17; small punched out areas on the right leg anteriorly posteriorly. Most of these appear to have closed over. Some of them have eschar over the surface. The most problematic area appears to be over the right lateral malleolus. We've been using prisma to all of this. He has arterial studies on April 30 and a rheumatology consult at [facility] on June 28 04/29/17; most of the small punched out areas on the right leg posteriorly are closed. He has 2 or 3 openings anteriorly but most of these appear to be on the way to closing. Still problematically over  the right lateral malleolus and right lateral [name] with almost ischemic-looking eschar. His arterial studies that I ordered are due to be done next week on the 30th so we should have them available for our next visit hopefully. He also saw a rheumatologist at [facility] and according to the patient he did 8 vials of blood. Finally he has scaling rash  on his left [name] and what looks to be at [facility] area on the left anterior leg 05/06/17; most of the small punched out areas on the right leg posteriorly and anteriorly are closed. He still has one small one Antosh, Kevork E. ([id number]) anteriorly one over the right lateral malleolus and one over the right lateral [name]. He had his arterial studies they didn't seem to do waveform analysis not exactly sure why however in any case is ABIs were noncompressible bilaterally. They did provide TBIs although looking at the pressures it appears that his TBIs are quite normal. I therefore went ahead and debrided the area over the right lateral malleolus and the right lateral [name] 05/13/17; most of the small punched out areas on the right leg posteriorly and anteriorly are closed. He continues to have problematic areas over the right lateral malleolus and the right lateral [name]. He still requiring aggressive debridement of these 2 wounds using silver collagen I reviewed the note from rheumatology I don't think they came up with a specific diagnosis although he is known to have seropositive RA. They did a panel of lab work when I was able to see his his AMA was negative, anti-smooth muscle antibodies negative antineutrophil cytoplasmic antibodies negative,Liv/kia type 1 negative. Serum C3 and C4 were negative. I don't see his muscle enzymes specifically. He was referred back to his local rheumatologist for management of his known rheumatoid arthritis.the patient states he still feels weak and fatigued. He states he has numbness in both feet and apparently is known to have neuropathy 05/20/17; the patient continues to have a difficult problem on the right lateral malleolus and the right lateral [name]. Both of these wounds have no viable surface even with attempts at debridement. He has a new wound on the left plantar metatarsal head which looks more like a superficial diabetic pressure related injury then part of  this underlying issue he has. Most of the rest of the wounds on his legs look satisfactory. Mostly on the right calf.reviewed his arterial studies which showed noncompressible vessels bilaterally but the TBIs were quite normal. 05/27/17; no real improvement in the right lateral malleolus and right lateral [name]. In fact the right lateral [name] is now on bone. I gave him doxycycline empirically last week it appears that he developed photosensitivity was 55 the son in a tractor. I'll not give him any more of this. This is predominantly on his face and dorsal forearms and hands. I given this more as an anti- inflammatory. I'm going to have him seen by vascular surgery. His TBIs that he had done previously ordered by Dr. Brigitte Pulse were in the normal range They never did full arterial studies on him. he now has small punched out wounds on the right lateral ankle and right lateral [name]. I doubt these are ischemic however I would like a review of his macrovascular status. He does describe some pain at night. I'll reduce the compression from 3 layer to 2 layer and I'm not convinced that this is a macrovascular issue however I want to make sure. We've been using silver alginate 06/03/17; the patient's x-rays that  I ordered last time of his right ankle and right lateral [name] did not show definite osteomyelitis. We've been using silver alginate. The wounds are not making any progress. The area on the plantar left first metatarsal head however appears to be better. The patient complains of weakness that is more of the fatigue. He says if he's walking his head will fall onto his chest and that his legs literally gave out on him. He did see neurology in the past however that was at a time where his workup was for temporal arteritis and headaches. 06/10/17; the patient is making no progress with the areas on the right lateral [name] and right lateral malleolus. In fact the area on the [name] probes to bone. X-ray did not show  osteomyelitis. He went and saw vein and vascular on 06/04/17 he was felt to have significant reflux in the left greater saphenous vein over this is not in the area we are most concerned about. He could be offered ablation. He was not felt to have venous reflux noted in the right lower extremity. He was felt to have some degree of lymphedema and he was felt to be a candidate for compression pumps. Finally a diagnostic arteriogram was suggested which is really what I'm most interested in. The patient has wife wanted time to think about this, I think they were confused about interacting between venous and arterial discussions. I think it would be probably well worth going through the angiogram. Culture I did have the deeper area on the right lateral [name] showed a few Enterococcus faecalis. I would like to start her on amoxicillin which they will start today for one-week 500 3 times a day 06/17/17; the patient still has punched out areas on the right lateral [name] and right lateral malleolus. There is not a viable surface here. We have been using Santyl. He also has an area on the plantar aspect of his left first metatarsal head this also seems to be better 06/24/17; patient's angiogram as on 07/06/17; still has punched out areas on the right lateral [name] and right lateral malleolus to which we've been applying Santyl-based dressings. He also has a more superficial area on the left plantar first metatarsal head, using collagen here 07/08/17; the patient had his angiogram. This perineal artery had a 70-80% stenosis. Similarly the posterior tibial artery also was diseased. Furthermore down the posterior tibial artery in the distal segment was a short stenosis of 80%. His major vessels in his thigh and only minor irregularity. He had percutaneous angioplasty of the proximal right peroneal artery. Also angioplasty of the tibial peroneal trunk and proximal posterior tibial artery as well as the mid to distal segment  of the posterior tibial artery. He handled this remarkably well. The patient arrived with a new wound on his right anterior calf which I think is the reopening of one of the original small open areas 07/15/17; the areas on his right anterior calf is new. He has a new threatened area on the right tibia more superiorly which is not open yet but makes me wonder whether this is going to reopen as well. His original wounds on the right lateral [name] and Salah, Nissim E. ([id number]) right lateral malleolus are somewhat better in terms of surface 07/22/17; the patient has a second open area on the right anterior. This was a threatened area superiorly last week. Each time this happens she has a small wound with a nephrotic cover. The areas on the right lateral [name]  and right lateral malleolus are about the same. I did not attempt debridement in any of these today continuing with Santyl. The area on the left second metatarsal head looks better and he is healed laceration injuries on the arm from a fall last week with only the ventral forearm wound left 08/05/17 08/05/17; 2 week follow-up. This is a patient who initially presented with a multitude of small punched out areas on the right anterior calf greater than left dorsal [name]. This was in the setting of a constitutional illness at which time he saw multiple specialists was worked up for various rheumatologic diseases including temporal arteritis. Most of the areas on the right leg and a few on the left actually closed over however he he developed 2 small deep probing areas on the right lateral [name] and right lateral malleolus. He also has an area on the left plantar first metatarsal head which is gradually been getting better. He was revascularized by Dr. dew about 4 weeks ago. He went to see Dr. Nehemiah Massed on 07/30/17; from review the note in time to the patient there wasn't an obvious cause. He did do a shave biopsy of the right anterior leg ulcer rule  out cancer or other etiologies such as some embolic. This looks like some form of vasculopathy to me although trying to explain why it so much worse on the right leg than the left is been difficult. We will await the biopsy results. Dr. Nehemiah Massed wanted to put Bactroban and this not sure what that will do for the areas that have a completely nonviable surface. I'd like to go back to [place]. 08/19/17-He is here in follow up evaluation for multiple ulcerations to the right lower extremity and right [name] and the left planter first metatarsal head ulcer. He has had biopsy to the right lateral proximal leg; no results available for review, patient and spouse report "negative" biopsy. We will continue with current treatment plan, using ace wrap compression to the right leg as his 15-65mHg compression stockings are providing suboptimal edema control. He will follow up in two weeks 09/02/17; the patient's biopsy done by dermatology/Dr. KNehemiah Massedwas apparently "negative". He has 2 open areas on the right calf one was the biopsy site on the right lateral calf. Most problematic wounds are on the right lateral malleolus and on the right lateral [name]. Both of these have some depth we've been using Santyl here. Also now a small open area remaining on the left plantar [name] 09/16/17 ; every week follow-up. The patient has been using Santyl to the area on the right lateral ankle and right lateral [name]. All the areas on the right calf including the biopsy site are closed. He still has the area on the first metatarsal head on the left. We've been using collagen to this as well Biweekly visit. The area on the left first metatarsal head is just about closed. We have a new tape injury on the dorsal left [name] that was caused by her nurse taking off his dressing. This is superficial. He still has difficult areas on the right lateral [name] and right lateral malleolus. He been using Endoform. I was hoping we were going to see  better results than today. Still required debridement. I'm going to put Oasis through his insurance Objective Constitutional Vitals Time Taken: 8:47 AM, Height: 68 in, Temperature: 97.7 F, Pulse: 80 bpm, Respiratory Rate: 16 breaths/min, Blood Pressure: 106/54 mmHg. Cardiovascular Pedal pulses are palpable right and left. He's been revascularized on  the right.[name] ADYEN, BIFULCO ([id number]) General Notes: wound exam The 2 open areas on the right lateral malleolus and the right dorsal [name] have necrotic debris that I removed with a #3 curet. This is a bit disappointing after we cleaned these up with Santyl. He's now been using Endoform. Integumentary (Hair, Skin) Wound #17 status is Open. Original cause of wound was Shear/Friction. The wound is located on the [place]. The wound measures 0.2cm length x 1cm width x 0.1cm depth; 0.157cm^2 area and 0.016cm^3 volume. There is no tunneling or undermining noted. The wound margin is flat and intact. There is large (67-100%) red, pink granulation within the wound bed. There is no necrotic tissue within the wound bed. The periwound skin appearance did not exhibit: Callus, Crepitus, Excoriation, Induration, Rash, Scarring, Dry/Scaly, Maceration, Atrophie Blanche, Cyanosis, Ecchymosis, Hemosiderin Staining, Mottled, Pallor, Rubor, Erythema. Wound #6 status is Open. Original cause of wound was Gradually Appeared. The wound is located on the Right,Lateral Malleolus. The wound measures 0.4cm length x 0.4cm width x 0.3cm depth; 0.126cm^2 area and 0.038cm^3 volume. Wound #8 status is Open. Original cause of wound was Gradually Appeared. The wound is located on the Right,Lateral [name]. The wound measures 0.4cm length x 0.4cm width x 0.4cm depth; 0.126cm^2 area and 0.05cm^3 volume. Wound #9 status is Open. Original cause of wound was Gradually Appeared. The wound is located on the Left Metatarsal head first. The wound measures 0.1cm length x 0.1cm  width x 0.1cm depth; 0.008cm^2 area and 0.001cm^3 volume. 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 There was a Excisional Skin/Subcutaneous Tissue Debridement with a total area of 0.16 sq cm performed by Ricard Dillon, MD. With the following instrument(s): Curette to remove Viable and Non-Viable tissue/material. Material removed includes Subcutaneous Tissue and Slough and after achieving pain control using Other (lidocaine 4%). A time out was conducted at 09:05, prior to the start of the procedure. A Minimum amount of bleeding was controlled with Pressure. The procedure was tolerated well with a pain level of 0 throughout and a pain level of 0 following the procedure. Post Debridement Measurements: 0.4cm length x 0.4cm width x 0.3cm depth; 0.038cm^3 volume. Character of Wound/Ulcer Post Debridement requires further debridement. Severity of Tissue Post Debridement is: Fat layer exposed. Post procedure Diagnosis Wound #6: Same as Pre-Procedure Wound #8 Pre-procedure diagnosis of Wound #8 is an Arterial Insufficiency Ulcer located on the Right,Lateral [name] .Severity of Tissue Pre Debridement is: Fat layer exposed. There was a Excisional Skin/Subcutaneous Tissue Debridement with a total area of Beeghly, Dino E. ([id number]) 0.16 sq cm performed by Ricard Dillon, MD. With the following instrument(s): Curette to remove Viable and Non- Viable tissue/material. Material removed includes Subcutaneous Tissue and Slough and after achieving pain  control using Other (lidocaine 4%). A time out was conducted at 09:05, prior to the start of the procedure. A Minimum amount of bleeding was controlled with Pressure. The procedure was tolerated well with a pain level of 0 throughout and a pain level of 0 following the procedure. Post Debridement Measurements: 0.4cm length x 0.4cm width x 0.4cm depth; 0.05cm^3 volume.  Character of Wound/Ulcer Post Debridement requires further debridement. Severity of Tissue Post Debridement is: Fat layer exposed. Post procedure Diagnosis Wound #8: Same as Pre-Procedure Plan Wound Cleansing: Wound #17 Left,Plantar [name]: Clean wound with Normal Saline. Wound #6 Right,Lateral Malleolus: Clean wound with Normal Saline. Wound #8 Right,Lateral [name]: Clean wound with Normal Saline. Wound #9 Left Metatarsal head first: Clean wound with Normal Saline. Anesthetic (add to Medication List): Wound #17 Left,Plantar [name]: Topical Lidocaine 4% cream applied to wound bed prior to debridement (In Clinic Only). Wound #6 Right,Lateral Malleolus: Topical Lidocaine 4% cream applied to wound bed prior to debridement (In Clinic Only). Wound #8 Right,Lateral [name]: Topical Lidocaine 4% cream applied to wound bed prior to debridement (In Clinic Only). Wound #9 Left Metatarsal head first: Topical Lidocaine 4% cream applied to wound bed prior to debridement (In Clinic Only). Primary Wound Dressing: Wound #6 Right,Lateral Malleolus: Other: - endoform Wound #8 Right,Lateral [name]: Other: - endoform Wound #9 Left Metatarsal head first: Alginate Secondary Dressing: Wound #17 Left,Plantar [name]: ABD pad Conform/Kerlix Wound #6 Right,Lateral Malleolus: ABD pad Conform/Kerlix Wound #8 Right,Lateral [name]: ABD pad Conform/Kerlix Wound #9 Left Metatarsal head first: ABD pad Conform/Kerlix Dressing Change Frequency: Wound #17 Left,Plantar [name]: Change Dressing Monday, Wednesday, Friday Wound #6 Right,Lateral  Malleolus: Change Dressing Monday, Wednesday, Friday NAI, DASCH ([id number]) Wound #8 Right,Lateral [name]: Change Dressing Monday, Wednesday, Friday Wound #9 Left Metatarsal head first: Change Dressing Monday, Wednesday, Friday Follow-up Appointments: Wound #17 Left,Plantar [name]: Return Appointment in 2 weeks. Wound #6 Right,Lateral Malleolus: Return Appointment in 2 weeks. Wound #8 Right,Lateral [name]: Return Appointment in 2 weeks. Wound #9 Left Metatarsal head first: Return Appointment in 2 weeks. Home Health: Wound #17 Left,Plantar [name]: [place] Nurse may visit PRN to address patient s wound care needs. FACE TO FACE ENCOUNTER: MEDICARE and MEDICAID PATIENTS: I certify that this patient is under my care and that I had a face-to-face encounter that meets the physician face-to-face encounter requirements with this patient on this date. The encounter with the patient was in whole or in part for the following MEDICAL CONDITION: (primary reason for [place]) MEDICAL NECESSITY: I certify, that based on my findings, NURSING services are a medically necessary home health service. HOME BOUND STATUS: I certify that my clinical findings support that this patient is homebound (i.e., Due to illness or injury, pt requires aid of supportive devices such as crutches, cane, wheelchairs, walkers, the use of special transportation or the assistance of another person to leave their place of residence. There is a normal inability to leave the home and doing so requires considerable and taxing effort. Other absences are for medical reasons / religious services and are infrequent or of short duration when for other reasons). If current dressing causes regression in wound condition, may D/C ordered dressing product/s and apply Normal Saline Moist Dressing daily until next [place] / Other MD appointment. [place] of regression  in wound condition at [phone]. Please direct any NON-WOUND related issues/requests for orders to patient's Primary Care Physician Wound #6 Right,Lateral Malleolus: [place] Nurse may visit PRN to address patient s wound care needs. FACE TO FACE ENCOUNTER: MEDICARE and MEDICAID PATIENTS: I certify that this patient is under my care and that I had a face-to-face encounter that meets the physician face-to-face encounter requirements with this patient on this date. The encounter with the patient was in whole or in part for the following MEDICAL  CONDITION: (primary reason for Home Healthcare) MEDICAL NECESSITY: I certify, that based on my findings, NURSING services are a medically necessary home health service. HOME BOUND STATUS: I certify that my clinical findings support that this patient is homebound (i.e., Due to illness or injury, pt requires aid of supportive devices such as crutches, cane, wheelchairs, walkers, the use of special transportation or the assistance of another person to leave their place of residence. There is a normal inability to leave the home and doing so requires considerable and taxing effort. Other absences are for medical reasons / religious services and are infrequent or of short duration when for other reasons). If current dressing causes regression in wound condition, may D/C ordered dressing product/s and apply Normal Saline Moist Dressing daily until next [place] / Other MD appointment. [place] of regression in wound condition at [phone]. Please direct any NON-WOUND related issues/requests for orders to patient's Primary Care Physician Wound #8 Right,Lateral [name]: [place] Nurse may visit PRN to address patient s wound care needs. FACE TO FACE ENCOUNTER: MEDICARE and MEDICAID PATIENTS: I certify that this patient is under my care and that I had a face-to-face  encounter that meets the physician face-to-face encounter requirements with this patient on this date. The encounter with the patient was in whole or in part for the following MEDICAL CONDITION: (primary reason for [place]) MEDICAL NECESSITY: I certify, that based on my findings, NURSING services are a medically necessary home health service. HOME BOUND STATUS: I certify that my clinical findings support that this patient is homebound (i.e., Due to illness or injury, pt requires aid of supportive devices such as crutches, cane, wheelchairs, walkers, the use of special transportation or the assistance of another person to leave their place of residence. There is a normal inability to leave the home and doing so requires considerable and taxing effort. Other absences are for medical reasons / religious services and are infrequent or of short duration when for other reasons). If current dressing causes regression in wound condition, may D/C ordered dressing product/s and apply Normal Saline Moist Dressing daily until next [place] / Other MD appointment. [place] of regression in ALEKSA, [place]. ([id number]) wound condition at [phone]. Please direct any NON-WOUND related issues/requests for orders to patient's Primary Care Physician Wound #9 Left Metatarsal head first: [place] Nurse may visit PRN to address patient s wound care needs. FACE TO FACE ENCOUNTER: MEDICARE and MEDICAID PATIENTS: I certify that this patient is under my care and that I had a face-to-face encounter that meets the physician face-to-face encounter requirements with this patient on this date. The encounter with the patient was in whole or in part for the following MEDICAL CONDITION: (primary reason for [place]) MEDICAL NECESSITY: I certify, that based on my findings, NURSING services are a medically necessary home health service. HOME BOUND  STATUS: I certify that my clinical findings support that this patient is homebound (i.e., Due to illness or injury, pt requires aid of supportive devices such as crutches, cane, wheelchairs, walkers, the use of special transportation or the assistance of another person to leave their place of residence. There is a normal inability to leave the home and doing so requires considerable and taxing effort. Other absences are for medical reasons / religious services and are infrequent or of short duration when for other reasons). If current dressing causes regression in wound  condition, may D/C ordered dressing product/s and apply Normal Saline Moist Dressing daily until next [place] / Other MD appointment. [place] of regression in wound condition at [phone]. Please direct any NON-WOUND related issues/requests for orders to patient's Primary Care Physician General Notes: [place] for [employer]. #1 the area on the left first metatarsal head is close to closing. He may have a small sliver of an opening left that I can barely see even with illumination.using calcium alginate AND continue this this week #2 we have a new tape injury to the left third met head area. use alginate to this area as well #3 areas on the right lateral malleolus right lateral [name] are really about the same. Both require extensive relative debridements to a healthy wound edge. I'm going to put [facility] through his insurance. Also this looks in 2 weeks he may need to go back to the [place] #4 the patient feels a lot better and he thinks the multitude of symptoms he had were result of amiodarone and he feels better as the amiodarone is reduced. I told him with regards to the wide set of wounds he had on the right greater than left lower extremities I've never seen a drug reaction look like this but I will look through the cutaneous side effects of amiodarone. I know there is a multitude  of other systemic effects of this drug Electronic Signature(s) Signed: 09/30/2017 6:15:06 PM By: Linton Ham MD Entered By: Linton Ham on 09/30/2017 09:25:42 Randol, [name] ([id number]) -------------------------------------------------------------------------------- SuperBill Details Patient Name: [name] Date of Service: 09/30/2017 Medical Record Number: [id number] Patient Account Number: [id number] Date of Birth/Sex: [birth year] (76 y.o. 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 of other part of [name] I25.5 Ischemic cardiomyopathy Facility Procedures CPT4 Code: [id number] Description: 70263 - DEB SUBQ TISSUE 20 SQ CM/< ICD-10 Diagnosis Description L97.512 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 with fat l Modifier: ayer exposed Quantity: 1 Physician Procedures CPT4 Code: [id number] Description: 11042 - WC PHYS SUBQ TISS 20 SQ CM ICD-10 Diagnosis Description L97.512 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 with fat l Modifier: ayer exposed Quantity: 1 Electronic Signature(s) Signed: 09/30/2017 6:15:06 PM By: Linton Ham MD Entered By: Linton Ham on 09/30/2017 09:26:09

## 2017-10-02 NOTE — Progress Notes (Signed)
VIPUL, CAFARELLI ([id number]) Visit Report for 09/30/2017 Arrival Information Details Patient Name: UEL, DAVIDOW Date of Service: 09/30/2017 8:30 AM Medical Record Number: [id number] Patient Account Number: [id number] Date of Birth/Sex: Jun 02, 1940 (76 y.o. M) Treating RN: Roger Shelter Primary Care Derrick Orris: Cyndi Bender Other Clinician: Referring Scherrie Seneca: Cyndi Bender Treating Royer Cristobal/Extender: Tito Dine in Treatment: 25 Visit Information History Since Last Visit All ordered tests and consults were completed: No Patient Arrived: Ambulatory Added or deleted any medications: No Arrival Time: 08:46 Any new allergies or adverse reactions: No Accompanied By: spouse Had a fall or experienced change in No Transfer Assistance: None activities of daily living that may affect Patient Identification Verified: Yes risk of falls: Secondary Verification Process Completed: Yes Signs or symptoms of abuse/neglect since last visito No Patient Requires Transmission-Based No Hospitalized since last visit: No Precautions: Implantable device outside of the clinic excluding No Patient Has Alerts: Yes cellular tissue based products placed in the center Patient Alerts: R ABI >220 since last visit: L ABI >220 Pain Present Now: No Electronic Signature(s) Signed: 09/30/2017 4:06:29 PM By: Roger Shelter Entered By: Roger Shelter on 09/30/2017 08:47:02 Halladay, [name] ([id number]) -------------------------------------------------------------------------------- Encounter Discharge Information Details Patient Name: [name] Date of Service: 09/30/2017 8:30 AM Medical Record Number: [id number] Patient Account Number: [id number] Date of Birth/Sex: [birth year] (76 y.o. M) Treating RN: Cornell Barman Primary Care Marlane Hirschmann: Cyndi Bender Other Clinician: Referring Antonette Hendricks: Cyndi Bender Treating Irelyn Perfecto/Extender: Tito Dine in Treatment:  25 Encounter Discharge Information Items Discharge Condition: Stable Ambulatory Status: Ambulatory Discharge Destination: Home Transportation: Private Auto Accompanied By: wife Schedule Follow-up Appointment: Yes Clinical Summary of Care: Post Procedure Vitals: Temperature (F): 97.7 Pulse (bpm): 80 Respiratory Rate (breaths/min): 16 Blood Pressure (mmHg): 106/54 Electronic Signature(s) Signed: 09/30/2017 5:31:05 PM By: Gretta Cool, BSN, RN, CWS, Kim RN, BSN Entered By: Gretta Cool, BSN, RN, CWS, Kim on 09/30/2017 09:12:28 Eckford, [name] ([id number]) -------------------------------------------------------------------------------- Lower Extremity Assessment Details Patient Name: [name] Date of Service: 09/30/2017 8:30 AM Medical Record Number: [id number] Patient Account Number: [id number] Date of Birth/Sex: 10/15/1940 (76 y.o. M) Treating RN: Roger Shelter Primary Care Dalayna Lauter: Cyndi Bender Other Clinician: Referring Zaryan Yakubov: Cyndi Bender Treating Kristie Bracewell/Extender: Tito Dine in Treatment: 25 Edema Assessment Assessed: [Left: No] [Right: No] Edema: [Left: No] [Right: No] Vascular Assessment Claudication: Claudication Assessment [Left:None] [Right:None] Pulses: Dorsalis Pedis Palpable: [Left:Yes] [Right:Yes] Posterior Tibial Extremity colors, hair growth, and conditions: Extremity Color: [Left:Normal] [Right:Hyperpigmented] Hair Growth on Extremity: [Left:No] [Right:No] Temperature of Extremity: [Left:Warm] [Right:Warm] Capillary Refill: [Left:< 3 seconds] [Right:< 3 seconds] Toe Nail Assessment Left: Right: Thick: Yes Yes Discolored: Yes Yes Deformed: Yes Yes Improper Length and Hygiene: Yes Yes Electronic Signature(s) Signed: 09/30/2017 4:06:29 PM By: Roger Shelter Entered By: Roger Shelter on 09/30/2017 08:57:57 Dipierro, [name] ([id number]) -------------------------------------------------------------------------------- Multi  Wound Chart Details Patient Name: [name] Date of Service: 09/30/2017 8:30 AM Medical Record Number: [id number] Patient Account Number: [id number] Date of Birth/Sex: 06/08/1940 (76 y.o. M) Treating RN: Cornell Barman Primary Care Khamille Beynon: Cyndi Bender Other Clinician: Referring Rainier Feuerborn: Cyndi Bender Treating Lemont Sitzmann/Extender: Tito Dine in Treatment: 25 Vital Signs Height(in): 68 Pulse(bpm): 80 Weight(lbs): Blood Pressure(mmHg): 106/54 Body Mass Index(BMI): Temperature(F): 97.7 Respiratory Rate 16 (breaths/min): Photos: [17:No Photos] [6:No Photos] [8:No Photos] Wound Location: [17:Left Foot - Plantar] [6:Right, Lateral Malleolus] [8:Right, Lateral Foot] Wounding Event: [17:Shear/Friction] [6:Gradually Appeared] [8:Gradually Appeared] Primary Etiology: [17:Trauma, Other] [6:Arterial Insufficiency Ulcer] [8:Arterial Insufficiency Ulcer] Comorbid History: [17:Congestive Heart Failure, Coronary Artery Disease, Hypertension,  Myocardial Infarction, Osteoarthritis] [6:N/A] [8:N/A] Date Acquired: [17:09/30/2017] [6:03/23/2017] [8:04/29/2017] Weeks of Treatment: [17:0] [6:25] [8:22] Wound Status: [17:Open] [6:Open] [8:Open] Measurements L x W x D [17:0.2x1x0.1] [6:0.4x0.4x0.3] [8:0.4x0.4x0.4] (cm) Area (cm) : [17:0.157] [6:0.126] [8:0.126] Volume (cm) : [17:0.016] [6:0.038] [8:0.05] % Reduction in Area: [17:N/A] [6:-306.50%] [8:-77.50%] % Reduction in Volume: [17:N/A] [6:-1166.70%] [8:-614.30%] Classification: [17:Full Thickness Without Exposed Support Structures] [6:Full Thickness Without Exposed Support Structures] [8:Full Thickness With Exposed Support Structures] Wound Margin: [17:Flat and Intact] [6:N/A] [8:N/A] Granulation Amount: [17:Large (67-100%)] [6:N/A] [8:N/A] Granulation Quality: [17:Red, Pink] [6:N/A] [8:N/A] Necrotic Amount: [17:None Present (0%)] [6:N/A] [8:N/A] Exposed Structures: [17:Fascia: No Fat Layer (Subcutaneous Tissue) Exposed: No  Tendon: No Muscle: No Joint: No Bone: No] [6:N/A] [8:N/A] Epithelialization: [17:None] [6:N/A] [8:N/A] Debridement: [17:N/A] [6:Debridement - Excisional] [8:Debridement - Excisional] Pre-procedure [17:N/A] [6:09:05] [8:09:05] Verification/Time Out Taken: Pain Control: [17:N/A] [6:Other] [8:Other] Tissue Debrided: [17:N/A] [6:Subcutaneous, Slough] [8:Subcutaneous, Slough] Level: [17:N/A] [6:Skin/Subcutaneous Tissue] [8:Skin/Subcutaneous Tissue] Debridement Area (sq cm): N/A 0.16 0.16 Instrument: N/A Curette Curette Bleeding: N/A Minimum Minimum Hemostasis Achieved: N/A Pressure Pressure Procedural Pain: N/A 0 0 Post Procedural Pain: N/A 0 0 Debridement Treatment N/A Procedure was tolerated well Procedure was tolerated well Response: Post Debridement N/A 0.4x0.4x0.3 0.4x0.4x0.4 Measurements L x W x D (cm) Post Debridement Volume: N/A 0.038 0.05 (cm) Periwound Skin Texture: Excoriation: No No Abnormalities Noted No Abnormalities Noted Induration: No Callus: No Crepitus: No Rash: No Scarring: No Periwound Skin Moisture: Maceration: No No Abnormalities Noted No Abnormalities Noted Dry/Scaly: No Periwound Skin Color: Atrophie Blanche: No No Abnormalities Noted No Abnormalities Noted Cyanosis: No Ecchymosis: No Erythema: No Hemosiderin Staining: No Mottled: No Pallor: No Rubor: No Tenderness on Palpation: No No No Wound Preparation: Ulcer Cleansing: N/A N/A Rinsed/Irrigated with Saline Topical Anesthetic Applied: Other: lidocaine 4% Procedures Performed: N/A Debridement Debridement Wound Number: 9 N/A N/A Photos: No Photos N/A N/A Wound Location: Left Metatarsal head first N/A N/A Wounding Event: Gradually Appeared N/A N/A Primary Etiology: Pressure Ulcer N/A N/A Comorbid History: N/A N/A N/A Date Acquired: 05/06/2017 N/A N/A Weeks of Treatment: 19 N/A N/A Wound Status: Open N/A N/A Measurements L x W x D 0.1x0.1x0.1 N/A N/A (cm) Area (cm) : 0.008 N/A N/A Volume  (cm) : 0.001 N/A N/A % Reduction in Area: 95.90% N/A N/A % Reduction in Volume: 95.00% N/A N/A Classification: Category/Stage II N/A N/A Wound Margin: N/A N/A N/A Granulation Amount: N/A N/A N/A Granulation Quality: N/A N/A N/A Necrotic Amount: N/A N/A N/A IMMANUEL, FEDAK ([id number]) Exposed Structures: N/A N/A N/A Epithelialization: N/A N/A N/A Debridement: N/A N/A N/A Pain Control: N/A N/A N/A Tissue Debrided: N/A N/A N/A Level: N/A N/A N/A Debridement Area (sq cm): N/A N/A N/A Instrument: N/A N/A N/A Bleeding: N/A N/A N/A Hemostasis Achieved: N/A N/A N/A Procedural Pain: N/A N/A N/A Post Procedural Pain: N/A N/A N/A Debridement Treatment N/A N/A N/A Response: Post Debridement N/A N/A N/A Measurements L x W x D (cm) Post Debridement Volume: N/A N/A N/A (cm) Periwound Skin Texture: No Abnormalities Noted N/A N/A Periwound Skin Moisture: No Abnormalities Noted N/A N/A Periwound Skin Color: No Abnormalities Noted N/A N/A Tenderness on Palpation: No N/A N/A Wound Preparation: N/A N/A N/A Procedures Performed: N/A N/A N/A Treatment Notes Electronic Signature(s) Signed: 09/30/2017 6:15:06 PM By: Linton Ham MD Entered By: Linton Ham on 09/30/2017 09:18:08 Peddy, [name] ([id number]) -------------------------------------------------------------------------------- Multi-Disciplinary Care Plan Details Patient Name: [name] Date of Service: 09/30/2017 8:30 AM Medical Record Number: [id number] Patient Account Number: [id number] Date of Birth/Sex: December 27, 1940 (  77 y.o. M) Treating RN: Cornell Barman Primary Care Troyce Febo: Cyndi Bender Other Clinician: Referring Alphons Burgert: Cyndi Bender Treating Ryka Beighley/Extender: Tito Dine in Treatment: 25 Active Inactive ` Abuse / Safety / Falls / Self Care Management Nursing Diagnoses: History of Falls Goals: Patient will remain injury free related to falls Date Initiated: 04/08/2017 Target  Resolution Date: 05/08/2017 Goal Status: Active Interventions: Assess fall risk on admission and as needed Notes: ` Orientation to the Wound Care Program Nursing Diagnoses: Knowledge deficit related to the wound healing center program Goals: Patient/caregiver will verbalize understanding of the [employer] Program Date Initiated: 04/08/2017 Target Resolution Date: 05/08/2017 Goal Status: Active Interventions: Provide education on orientation to the wound center Notes: ` Soft Tissue Infection Nursing Diagnoses: Impaired tissue integrity Potential for infection: soft tissue Goals: Patient will remain free of wound infection Date Initiated: 04/08/2017 Target Resolution Date: 05/08/2017 Goal Status: Active FRIEDRICH, HARRIOTT ([id number]) Interventions: Assess signs and symptoms of infection every visit Notes: ` Wound/Skin Impairment Nursing Diagnoses: Impaired tissue integrity Goals: Ulcer/skin breakdown will heal within 14 weeks Date Initiated: 04/08/2017 Target Resolution Date: 07/20/2017 Goal Status: Active Interventions: Assess patient/caregiver ability to perform ulcer/skin care regimen upon admission and as needed Provide education on ulcer and skin care Treatment Activities: Topical wound management initiated : 04/08/2017 Notes: Electronic Signature(s) Signed: 09/30/2017 5:31:05 PM By: Gretta Cool, BSN, RN, CWS, Kim RN, BSN Entered By: Gretta Cool, BSN, RN, CWS, Kim on 09/30/2017 09:03:22 [place], [name] ([id number]) -------------------------------------------------------------------------------- Pain Assessment Details Patient Name: [name] Date of Service: 09/30/2017 8:30 AM Medical Record Number: [id number] Patient Account Number: [id number] Date of Birth/Sex: 06/04/1940 (76 y.o. M) Treating RN: Roger Shelter Primary Care Kristin Barcus: Cyndi Bender Other Clinician: Referring Ely Ballen: Cyndi Bender Treating Clarity Ciszek/Extender: Tito Dine in  Treatment: 25 Active Problems Location of Pain Severity and Description of Pain Patient Has Paino No Site Locations Pain Management and Medication Current Pain Management: Electronic Signature(s) Signed: 09/30/2017 4:06:29 PM By: Roger Shelter Entered By: Roger Shelter on 09/30/2017 08:47:10 Klar, [name] ([id number]) -------------------------------------------------------------------------------- Patient/Caregiver Education Details Patient Name: [name] Date of Service: 09/30/2017 8:30 AM Medical Record Number: [id number] Patient Account Number: [id number] Date of Birth/Gender: 1940/06/12 (76 y.o. M) Treating RN: Cornell Barman Primary Care Physician: Cyndi Bender Other Clinician: Referring Physician: Cyndi Bender Treating Physician/Extender: Tito Dine in Treatment: 25 Education Assessment Education Provided To: Patient Education Topics Provided Wound/Skin Impairment: Handouts: Caring for Your Ulcer Methods: Demonstration, Explain/Verbal Responses: State content correctly Electronic Signature(s) Signed: 09/30/2017 5:31:05 PM By: Gretta Cool, BSN, RN, CWS, Kim RN, BSN Entered By: Gretta Cool, BSN, RN, CWS, Kim on 09/30/2017 09:13:01 Garden, [name] ([id number]) -------------------------------------------------------------------------------- Wound Assessment Details Patient Name: [name] Date of Service: 09/30/2017 8:30 AM Medical Record Number: [id number] Patient Account Number: [id number] Date of Birth/Sex: [birth year] (76 y.o. M) Treating RN: Roger Shelter Primary Care Catherin Doorn: Cyndi Bender Other Clinician: Referring Yorel Redder: Cyndi Bender Treating Ardie Dragoo/Extender: Tito Dine in Treatment: 25 Wound Status Wound Number: 17 Primary Trauma, Other Etiology: Wound Location: Left Foot - Plantar Wound Open Wounding Event: Shear/Friction Status: Date Acquired: 09/30/2017 Comorbid Congestive Heart Failure, Coronary  Artery Weeks Of Treatment: 0 History: Disease, Hypertension, Myocardial Infarction, Clustered Wound: No Osteoarthritis Photos Photo Uploaded By: Roger Shelter on 09/30/2017 16:17:21 Wound Measurements Length: (cm) 0.2 Width: (cm) 1 Depth: (cm) 0.1 Area: (cm) 0.157 Volume: (cm) 0.016 % Reduction in Area: % Reduction in Volume: Epithelialization: None Tunneling: No Undermining: No Wound Description Full Thickness Without Exposed  Support Foul Classification: Structures Slou Wound Flat and Intact Margin: Odor After Cleansing: No gh/Fibrino No Wound Bed Granulation Amount: Large (67-100%) Exposed Structure Granulation Quality: Red, Pink Fascia Exposed: No Necrotic Amount: None Present (0%) Fat Layer (Subcutaneous Tissue) Exposed: No Tendon Exposed: No Muscle Exposed: No Joint Exposed: No Bone Exposed: No Periwound Skin Texture Texture Color Srinivasan, Jencarlo E. ([id number]) No Abnormalities Noted: No No Abnormalities Noted: No Callus: No Atrophie Blanche: No Crepitus: No Cyanosis: No Excoriation: No Ecchymosis: No Induration: No Erythema: No Rash: No Hemosiderin Staining: No Scarring: No Mottled: No Pallor: No Moisture Rubor: No No Abnormalities Noted: No Dry / Scaly: No Maceration: No Wound Preparation Ulcer Cleansing: Rinsed/Irrigated with Saline Topical Anesthetic Applied: Other: lidocaine 4%, Treatment Notes Wound #17 (Left, Plantar Foot) 1. Cleansed with: Clean wound with Normal Saline 2. Anesthetic Topical Lidocaine 4% cream to wound bed prior to debridement 3. Peri-wound Care: Skin Prep 5. Secondary Dressing Applied Kerlix/Conform Notes silvercell Electronic Signature(s) Signed: 09/30/2017 4:06:29 PM By: Roger Shelter Entered By: Roger Shelter on 09/30/2017 08:55:15 Braxton, [name] ([id number]) -------------------------------------------------------------------------------- Wound Assessment Details Patient Name: [name] Date of Service: 09/30/2017 8:30 AM Medical Record Number: [id number] Patient Account Number: [id number] Date of Birth/Sex: 08-31-1940 (76 y.o. M) Treating RN: Roger Shelter Primary Care Ocean Schildt: Cyndi Bender Other Clinician: Referring Tyresse Jayson: Cyndi Bender Treating Keller Bounds/Extender: Tito Dine in Treatment: 25 Wound Status Wound Number: 6 Primary Etiology: Arterial Insufficiency Ulcer Wound Location: Right, Lateral Malleolus Wound Status: Open Wounding Event: Gradually Appeared Date Acquired: 03/23/2017 Weeks Of Treatment: 25 Clustered Wound: No Photos Photo Uploaded By: Roger Shelter on 09/30/2017 16:15:07 Wound Measurements Length: (cm) 0.4 Width: (cm) 0.4 Depth: (cm) 0.3 Area: (cm) 0.126 Volume: (cm) 0.038 % Reduction in Area: -306.5% % Reduction in Volume: -1166.7% Wound Description Full Thickness Without Exposed Support Classification: Structures Periwound Skin Texture Texture Color No Abnormalities Noted: No No Abnormalities Noted: No Moisture No Abnormalities Noted: No Treatment Notes Wound #6 (Right, Lateral Malleolus) 1. Cleansed with: Clean wound with Normal Saline 2. Anesthetic Topical Lidocaine 4% cream to wound bed prior to debridement Costales, Antwane E. ([id number]) 3. Peri-wound Care: Moisturizing lotion 4. Dressing Applied: Other dressing (specify in notes) 5. Secondary Dressing Applied ABD Pad Notes endoform, kerlix and coban wrap Electronic Signature(s) Signed: 09/30/2017 4:06:29 PM By: Roger Shelter Entered By: Roger Shelter on 09/30/2017 08:53:38 Boateng, [name] ([id number]) -------------------------------------------------------------------------------- Wound Assessment Details Patient Name: [name] Date of Service: 09/30/2017 8:30 AM Medical Record Number: [id number] Patient Account Number: [id number] Date of Birth/Sex: May 02, 1940 (76 y.o. M) Treating RN: Roger Shelter Primary  Care Nyella Eckels: Cyndi Bender Other Clinician: Referring Isaul Landi: Cyndi Bender Treating Shana Younge/Extender: Tito Dine in Treatment: 25 Wound Status Wound Number: 8 Primary Etiology: Arterial Insufficiency Ulcer Wound Location: Right, Lateral Foot Wound Status: Open Wounding Event: Gradually Appeared Date Acquired: 04/29/2017 Weeks Of Treatment: 22 Clustered Wound: No Photos Photo Uploaded By: Roger Shelter on 09/30/2017 16:15:08 Wound Measurements Length: (cm) 0.4 Width: (cm) 0.4 Depth: (cm) 0.4 Area: (cm) 0.126 Volume: (cm) 0.05 % Reduction in Area: -77.5% % Reduction in Volume: -614.3% Wound Description Full Thickness With Exposed Support Classification: Structures Periwound Skin Texture Texture Color No Abnormalities Noted: No No Abnormalities Noted: No Moisture No Abnormalities Noted: No Treatment Notes Wound #8 (Right, Lateral Foot) 1. Cleansed with: Clean wound with Normal Saline 2. Anesthetic Topical Lidocaine 4% cream to wound bed prior to debridement Moede, Edmon E. ([id number]) 3. Peri-wound Care: Moisturizing lotion 4. Dressing Applied: Other  dressing (specify in notes) 5. Secondary Dressing Applied ABD Pad Notes endoform, kerlix and coban wrap Electronic Signature(s) Signed: 09/30/2017 4:06:29 PM By: Roger Shelter Entered By: Roger Shelter on 09/30/2017 08:53:38 Cassells, [name] ([id number]) -------------------------------------------------------------------------------- Wound Assessment Details Patient Name: [name] Date of Service: 09/30/2017 8:30 AM Medical Record Number: [id number] Patient Account Number: [id number] Date of Birth/Sex: [birth year] (76 y.o. M) Treating RN: Roger Shelter Primary Care Khaliya Golinski: Cyndi Bender Other Clinician: Referring Gizelle Whetsel: Cyndi Bender Treating Nala Kachel/Extender: Tito Dine in Treatment: 25 Wound Status Wound Number: 9 Primary Etiology: Pressure  Ulcer Wound Location: Left Metatarsal head first Wound Status: Open Wounding Event: Gradually Appeared Date Acquired: 05/06/2017 Weeks Of Treatment: 19 Clustered Wound: No Photos Photo Uploaded By: Roger Shelter on 09/30/2017 16:15:25 Wound Measurements Length: (cm) 0.1 Width: (cm) 0.1 Depth: (cm) 0.1 Area: (cm) 0.008 Volume: (cm) 0.001 % Reduction in Area: 95.9% % Reduction in Volume: 95% Wound Description Classification: Category/Stage II Periwound Skin Texture Texture Color No Abnormalities Noted: No No Abnormalities Noted: No Moisture No Abnormalities Noted: No Treatment Notes Wound #9 (Left Metatarsal head first) 1. Cleansed with: Clean wound with Normal Saline 2. Anesthetic Topical Lidocaine 4% cream to wound bed prior to debridement 3. Peri-wound Care: CREEK, GAN ([id number]) Skin Prep 5. Secondary Dressing Applied Kerlix/Conform Notes silvercell Electronic Signature(s) Signed: 09/30/2017 4:06:29 PM By: Roger Shelter Entered By: Roger Shelter on 09/30/2017 08:53:38 Bracamonte, [name] ([id number]) -------------------------------------------------------------------------------- Vitals Details Patient Name: [name] Date of Service: 09/30/2017 8:30 AM Medical Record Number: [id number] Patient Account Number: [id number] Date of Birth/Sex: August 05, 1940 (76 y.o. M) Treating RN: Roger Shelter Primary Care Teairra Millar: Cyndi Bender Other Clinician: Referring Geordie Nooney: Cyndi Bender Treating Avelino Herren/Extender: Tito Dine in Treatment: 25 Vital Signs Time Taken: 08:47 Temperature (F): 97.7 Height (in): 68 Pulse (bpm): 80 Respiratory Rate (breaths/min): 16 Blood Pressure (mmHg): 106/54 Reference Range: 80 - 120 mg / dl Electronic Signature(s) Signed: 09/30/2017 4:06:29 PM By: Roger Shelter Entered By: Roger Shelter on 09/30/2017 08:47:31

## 2017-10-05 DIAGNOSIS — I110 Hypertensive heart disease with heart failure: Secondary | ICD-10-CM | POA: Diagnosis not present

## 2017-10-05 DIAGNOSIS — Z48817 Encounter for surgical aftercare following surgery on the skin and subcutaneous tissue: Secondary | ICD-10-CM | POA: Diagnosis not present

## 2017-10-05 DIAGNOSIS — I5023 Acute on chronic systolic (congestive) heart failure: Secondary | ICD-10-CM | POA: Diagnosis not present

## 2017-10-05 DIAGNOSIS — L97311 Non-pressure chronic ulcer of right ankle limited to breakdown of skin: Secondary | ICD-10-CM | POA: Diagnosis not present

## 2017-10-05 DIAGNOSIS — I2510 Atherosclerotic heart disease of native coronary artery without angina pectoris: Secondary | ICD-10-CM | POA: Diagnosis not present

## 2017-10-05 DIAGNOSIS — L97511 Non-pressure chronic ulcer of other part of right foot limited to breakdown of skin: Secondary | ICD-10-CM | POA: Diagnosis not present

## 2017-10-07 DIAGNOSIS — L97511 Non-pressure chronic ulcer of other part of right foot limited to breakdown of skin: Secondary | ICD-10-CM | POA: Diagnosis not present

## 2017-10-07 DIAGNOSIS — I110 Hypertensive heart disease with heart failure: Secondary | ICD-10-CM | POA: Diagnosis not present

## 2017-10-07 DIAGNOSIS — Z48817 Encounter for surgical aftercare following surgery on the skin and subcutaneous tissue: Secondary | ICD-10-CM | POA: Diagnosis not present

## 2017-10-07 DIAGNOSIS — I2510 Atherosclerotic heart disease of native coronary artery without angina pectoris: Secondary | ICD-10-CM | POA: Diagnosis not present

## 2017-10-07 DIAGNOSIS — I5023 Acute on chronic systolic (congestive) heart failure: Secondary | ICD-10-CM | POA: Diagnosis not present

## 2017-10-07 DIAGNOSIS — L97311 Non-pressure chronic ulcer of right ankle limited to breakdown of skin: Secondary | ICD-10-CM | POA: Diagnosis not present

## 2017-10-09 DIAGNOSIS — L97511 Non-pressure chronic ulcer of other part of right foot limited to breakdown of skin: Secondary | ICD-10-CM | POA: Diagnosis not present

## 2017-10-09 DIAGNOSIS — I2510 Atherosclerotic heart disease of native coronary artery without angina pectoris: Secondary | ICD-10-CM | POA: Diagnosis not present

## 2017-10-09 DIAGNOSIS — L97311 Non-pressure chronic ulcer of right ankle limited to breakdown of skin: Secondary | ICD-10-CM | POA: Diagnosis not present

## 2017-10-09 DIAGNOSIS — I110 Hypertensive heart disease with heart failure: Secondary | ICD-10-CM | POA: Diagnosis not present

## 2017-10-09 DIAGNOSIS — I5023 Acute on chronic systolic (congestive) heart failure: Secondary | ICD-10-CM | POA: Diagnosis not present

## 2017-10-09 DIAGNOSIS — Z48817 Encounter for surgical aftercare following surgery on the skin and subcutaneous tissue: Secondary | ICD-10-CM | POA: Diagnosis not present

## 2017-10-12 DIAGNOSIS — I2510 Atherosclerotic heart disease of native coronary artery without angina pectoris: Secondary | ICD-10-CM | POA: Diagnosis not present

## 2017-10-12 DIAGNOSIS — L97511 Non-pressure chronic ulcer of other part of right foot limited to breakdown of skin: Secondary | ICD-10-CM | POA: Diagnosis not present

## 2017-10-12 DIAGNOSIS — I110 Hypertensive heart disease with heart failure: Secondary | ICD-10-CM | POA: Diagnosis not present

## 2017-10-12 DIAGNOSIS — Z48817 Encounter for surgical aftercare following surgery on the skin and subcutaneous tissue: Secondary | ICD-10-CM | POA: Diagnosis not present

## 2017-10-12 DIAGNOSIS — I5023 Acute on chronic systolic (congestive) heart failure: Secondary | ICD-10-CM | POA: Diagnosis not present

## 2017-10-12 DIAGNOSIS — L97311 Non-pressure chronic ulcer of right ankle limited to breakdown of skin: Secondary | ICD-10-CM | POA: Diagnosis not present

## 2017-10-14 ENCOUNTER — Encounter: Payer: Medicare Other | Attending: Internal Medicine | Admitting: Internal Medicine

## 2017-10-14 DIAGNOSIS — E11621 Type 2 diabetes mellitus with foot ulcer: Secondary | ICD-10-CM | POA: Insufficient documentation

## 2017-10-14 DIAGNOSIS — L97512 Non-pressure chronic ulcer of other part of right foot with fat layer exposed: Secondary | ICD-10-CM | POA: Insufficient documentation

## 2017-10-14 DIAGNOSIS — I255 Ischemic cardiomyopathy: Secondary | ICD-10-CM | POA: Insufficient documentation

## 2017-10-14 DIAGNOSIS — L97521 Non-pressure chronic ulcer of other part of left foot limited to breakdown of skin: Secondary | ICD-10-CM | POA: Diagnosis not present

## 2017-10-14 DIAGNOSIS — I890 Lymphedema, not elsewhere classified: Secondary | ICD-10-CM | POA: Insufficient documentation

## 2017-10-14 DIAGNOSIS — I70233 Atherosclerosis of native arteries of right leg with ulceration of ankle: Secondary | ICD-10-CM | POA: Diagnosis not present

## 2017-10-14 DIAGNOSIS — M1990 Unspecified osteoarthritis, unspecified site: Secondary | ICD-10-CM | POA: Diagnosis not present

## 2017-10-14 DIAGNOSIS — E1151 Type 2 diabetes mellitus with diabetic peripheral angiopathy without gangrene: Secondary | ICD-10-CM | POA: Diagnosis not present

## 2017-10-14 DIAGNOSIS — I252 Old myocardial infarction: Secondary | ICD-10-CM | POA: Diagnosis not present

## 2017-10-14 DIAGNOSIS — I509 Heart failure, unspecified: Secondary | ICD-10-CM | POA: Insufficient documentation

## 2017-10-14 DIAGNOSIS — M069 Rheumatoid arthritis, unspecified: Secondary | ICD-10-CM | POA: Diagnosis not present

## 2017-10-14 DIAGNOSIS — I2510 Atherosclerotic heart disease of native coronary artery without angina pectoris: Secondary | ICD-10-CM | POA: Diagnosis not present

## 2017-10-14 DIAGNOSIS — L97312 Non-pressure chronic ulcer of right ankle with fat layer exposed: Secondary | ICD-10-CM | POA: Diagnosis not present

## 2017-10-14 DIAGNOSIS — L97311 Non-pressure chronic ulcer of right ankle limited to breakdown of skin: Secondary | ICD-10-CM | POA: Insufficient documentation

## 2017-10-14 DIAGNOSIS — I70235 Atherosclerosis of native arteries of right leg with ulceration of other part of foot: Secondary | ICD-10-CM | POA: Insufficient documentation

## 2017-10-14 DIAGNOSIS — I110 Hypertensive heart disease with heart failure: Secondary | ICD-10-CM | POA: Insufficient documentation

## 2017-10-16 DIAGNOSIS — I2510 Atherosclerotic heart disease of native coronary artery without angina pectoris: Secondary | ICD-10-CM | POA: Diagnosis not present

## 2017-10-16 DIAGNOSIS — L97511 Non-pressure chronic ulcer of other part of right foot limited to breakdown of skin: Secondary | ICD-10-CM | POA: Diagnosis not present

## 2017-10-16 DIAGNOSIS — Z48817 Encounter for surgical aftercare following surgery on the skin and subcutaneous tissue: Secondary | ICD-10-CM | POA: Diagnosis not present

## 2017-10-16 DIAGNOSIS — I110 Hypertensive heart disease with heart failure: Secondary | ICD-10-CM | POA: Diagnosis not present

## 2017-10-16 DIAGNOSIS — I5023 Acute on chronic systolic (congestive) heart failure: Secondary | ICD-10-CM | POA: Diagnosis not present

## 2017-10-16 DIAGNOSIS — L97311 Non-pressure chronic ulcer of right ankle limited to breakdown of skin: Secondary | ICD-10-CM | POA: Diagnosis not present

## 2017-10-17 NOTE — Progress Notes (Signed)
[name], [name] ([id number]) Visit Report for 10/14/2017 Debridement Details Patient Name: [name], [name] Date of Service: 10/14/2017 8:30 AM Medical Record Number: [id number] Patient Account Number: [id number] Date of Birth/Sex: August 18, 1940 (77 y.o. M) Treating RN: Cornell Barman Primary Care Provider: Cyndi Bender Other Clinician: Referring Provider: Cyndi Bender Treating Provider/Extender: Tito Dine in Treatment: 27 Debridement Performed for Wound #6 Right,Lateral Malleolus Assessment: Performed By: Physician Ricard Dillon, MD Debridement Type: Debridement Severity of Tissue Pre Fat layer exposed Debridement: Level of Consciousness (Pre- Awake and Alert procedure): Pre-procedure Verification/Time Yes - 09:14 Out Taken: Start Time: 09:14 Pain Control: Other : lidocaine 4% Total Area Debrided (L x W): 0.4 (cm) x 0.4 (cm) = 0.16 (cm) Tissue and other material Viable, Non-Viable, Slough, Subcutaneous, Slough debrided: Level: Skin/Subcutaneous Tissue Debridement Description: Excisional Instrument: Curette Bleeding: Minimum Hemostasis Achieved: Pressure End Time: 09:17 Response to Treatment: Procedure was tolerated well Level of Consciousness Awake and Alert (Post-procedure): Post Debridement Measurements of Total Wound Length: (cm) 0.4 Width: (cm) 0.4 Depth: (cm) 0.3 Volume: (cm) 0.038 Character of Wound/Ulcer Post Debridement: Stable Severity of Tissue Post Debridement: Fat layer exposed Post Procedure Diagnosis Same as Pre-procedure Electronic Signature(s) Signed: 10/14/2017 5:34:51 PM By: Gretta Cool, BSN, RN, CWS, Kim RN, BSN Signed: 10/14/2017 6:14:59 PM By: Linton Ham MD Entered By: Linton Ham on 10/14/2017 09:40:47 Mixson, [name] ([id number]) -------------------------------------------------------------------------------- Debridement Details Patient Name: [name] Date of Service: 10/14/2017 8:30 AM Medical Record  Number: [id number] Patient Account Number: [id number] Date of Birth/Sex: 03/14/1940 (77 y.o. M) Treating RN: Cornell Barman Primary Care Provider: Cyndi Bender Other Clinician: Referring Provider: Cyndi Bender Treating Provider/Extender: Tito Dine in Treatment: 27 Debridement Performed for Wound #8 Right,Lateral [name] Assessment: Performed By: Physician Ricard Dillon, MD Debridement Type: Debridement Severity of Tissue Pre Fat layer exposed Debridement: Level of Consciousness (Pre- Awake and Alert procedure): Pre-procedure Verification/Time Yes - 09:14 Out Taken: Start Time: 09:14 Pain Control: Other : lidocaine 4% Total Area Debrided (L x W): 0.5 (cm) x 0.5 (cm) = 0.25 (cm) Tissue and other material Viable, Non-Viable, Slough, Subcutaneous, Slough debrided: Level: Skin/Subcutaneous Tissue Debridement Description: Excisional Instrument: Curette Bleeding: Minimum Hemostasis Achieved: Pressure End Time: 09:17 Response to Treatment: Procedure was tolerated well Level of Consciousness Awake and Alert (Post-procedure): Post Debridement Measurements of Total Wound Length: (cm) 0.5 Width: (cm) 0.6 Depth: (cm) 0.4 Volume: (cm) 0.094 Character of Wound/Ulcer Post Debridement: Stable Severity of Tissue Post Debridement: Fat layer exposed Post Procedure Diagnosis Same as Pre-procedure Electronic Signature(s) Signed: 10/14/2017 5:34:51 PM By: Gretta Cool, BSN, RN, CWS, Kim RN, BSN Signed: 10/14/2017 6:14:59 PM By: Linton Ham MD Entered By: Linton Ham on 10/14/2017 09:40:58 [name], [name] ([id number]) -------------------------------------------------------------------------------- HPI Details Patient Name: [name] Date of Service: 10/14/2017 8:30 AM Medical Record Number: [id number] Patient Account Number: [id number] Date of Birth/Sex: 04-07-1940 (77 y.o. M) Treating RN: Cornell Barman Primary Care Provider: Cyndi Bender Other  Clinician: Referring Provider: Cyndi Bender Treating Provider/Extender: Tito Dine in Treatment: 27 History of Present Illness HPI Description: 04/08/17; this is a complex 77 year old man referred here from [facility]. He had been referred there for bilateral lower extremity edema with ulcer formation predominantly on the right calf but also the right [name]. He had been receiving Unna boots bilaterally. The history here is long. He is not a diabetic however ICU looking through late 2018 he was worked up for chronic headaches, elevated inflammatory markers including C-reactive protein and ESR. He  went on to actually have a left temporal artery biopsy wasn't that was negative.he received about 6 weeks of high-dose prednisone 60 mg with improvement in his inflammatory markers. He was admitted to hospital in late November with ventricular tachycardia syncope. He has known ischemic cardiomyopathy. He was admitted in the hospital in mid December. Apparently this was precipitated by a syncopal spell falling out of his scooter while at [place]. There was ventricular arrhythmia. He has an implantable defibrillator and echocardiogram showed severe LV dysfunction with an EF of 20% and valvular regurgitations including mild AR, moderate MR. There was no stenosis. He ruled in for a non-ST elevation MI in the setting of V. tach.his wife states that sometime during this hospitalization she noted multiple areas of skin change on the right lower calf which became evident just after he left the hospital. He was back in hospital in February with acute renal failure hyponatremia. This responded to fluid resuscitation.. Interestingly I can't see much description of his right leg at that point in time.he was followed by Dr. Nehemiah Massed of dermatology for the necrotic wounds on his right leg. Apparently a biopsy was planned at one point but not done although in some notes that suggests it was.  I cannot see these results area He was noted to have a lot of edema. Was treated with bilateral Unna boots edges really helped with the swelling they have been using Bactroban to small open areas predominantly on the right anterior lower leg His history is complicated by the fact that he has rheumatoid arthritis followed by rheumatology. He is followed by neurology for disabling headaches. At one point this was felt to be giant cell arteritis although a left temporal biopsy was apparently negative. He was given a prolonged course of prednisone at 60 mg which managed his sedimentation rates but apparently did not prove improve the headaches. This is been tapered to off on by rheumatology on 03/13/17 Vascular had plans to do a venous reflux workup as well as arterial studies in May. They also wanted to get him a lymphedema pump. As mentioned he's been using bacitracin under Unna boot wraps to both lower legs 04/15/17; the patient arrives with most of his wounds improved. These are small punched out wounds. Most of them remaining ones are on the right anterior calf with the most problematic over the right lateral malleolus. There are no new areas. The symptom complex or potential symptom complex we are dealing with his chronic disabling headaches with inflammatory markers not responsive to prednisone and with a negative temporal biopsy, lower extremity weakness, skin ulcerations just on the right leg. We have managed to get his arterial studies moved up to April 30. He has a rheumatology consult at [facility] in June. He has seen dermatology locally, rheumatology locally, neurology locally. 04/22/17; small punched out areas on the right leg anteriorly posteriorly. Most of these appear to have closed over. Some of them have eschar over the surface. The most problematic area appears to be over the right lateral malleolus. We've been using prisma to all of this. He has arterial studies on April 30 and a  rheumatology consult at [facility] on June 28 04/29/17; most of the small punched out areas on the right leg posteriorly are closed. He has 2 or 3 openings anteriorly but most of these appear to be on the way to closing. Still problematically over the right lateral malleolus and right lateral [name] with almost ischemic-looking eschar. His arterial studies that I ordered  are due to be done next week on the 30th so we should have them available for our next visit hopefully. He also saw a rheumatologist at [facility] and according to the patient he did 8 vials of blood. Finally he has scaling rash on his left [name] and what looks to be at [facility] area on the left anterior leg 05/06/17; most of the small punched out areas on the right leg posteriorly and anteriorly are closed. He still has one small one anteriorly one over the right lateral malleolus and one over the right lateral [name]. [name], [name] ([id number]) He had his arterial studies they didn't seem to do waveform analysis not exactly sure why however in any case is ABIs were noncompressible bilaterally. They did provide TBIs although looking at the pressures it appears that his TBIs are quite normal. I therefore went ahead and debrided the area over the right lateral malleolus and the right lateral [name] 05/13/17; most of the small punched out areas on the right leg posteriorly and anteriorly are closed. He continues to have problematic areas over the right lateral malleolus and the right lateral [name]. He still requiring aggressive debridement of these 2 wounds using silver collagen I reviewed the note from rheumatology I don't think they came up with a specific diagnosis although he is known to have seropositive RA. They did a panel of lab work when I was able to see his his AMA was negative, anti-smooth muscle antibodies negative antineutrophil cytoplasmic antibodies negative,Liv/kia type 1 negative. Serum C3 and C4 were negative. I don't see his muscle  enzymes specifically. He was referred back to his local rheumatologist for management of his known rheumatoid arthritis.the patient states he still feels weak and fatigued. He states he has numbness in both feet and apparently is known to have neuropathy 05/20/17; the patient continues to have a difficult problem on the right lateral malleolus and the right lateral [name]. Both of these wounds have no viable surface even with attempts at debridement. He has a new wound on the left plantar metatarsal head which looks more like a superficial diabetic pressure related injury then part of this underlying issue he has. Most of the rest of the wounds on his legs look satisfactory. Mostly on the right calf.reviewed his arterial studies which showed noncompressible vessels bilaterally but the TBIs were quite normal. 05/27/17; no real improvement in the right lateral malleolus and right lateral [name]. In fact the right lateral [name] is now on bone. I gave him doxycycline empirically last week it appears that he developed photosensitivity was 50 the son in a tractor. I'll not give him any more of this. This is predominantly on his face and dorsal forearms and hands. I given this more as an anti- inflammatory. I'm going to have him seen by vascular surgery. His TBIs that he had done previously ordered by Dr. Brigitte Pulse were in the normal range They never did full arterial studies on him. he now has small punched out wounds on the right lateral ankle and right lateral [name]. I doubt these are ischemic however I would like a review of his macrovascular status. He does describe some pain at night. I'll reduce the compression from 3 layer to 2 layer and I'm not convinced that this is a macrovascular issue however I want to make sure. We've been using silver alginate 06/03/17; the patient's x-rays that I ordered last time of his right ankle and right lateral [name] did not show definite osteomyelitis. We've been  using silver  alginate. The wounds are not making any progress. The area on the plantar left first metatarsal head however appears to be better. The patient complains of weakness that is more of the fatigue. He says if he's walking his head will fall onto his chest and that his legs literally gave out on him. He did see neurology in the past however that was at a time where his workup was for temporal arteritis and headaches. 06/10/17; the patient is making no progress with the areas on the right lateral [name] and right lateral malleolus. In fact the area on the [name] probes to bone. X-ray did not show osteomyelitis. He went and saw vein and vascular on 06/04/17 he was felt to have significant reflux in the left greater saphenous vein over this is not in the area we are most concerned about. He could be offered ablation. He was not felt to have venous reflux noted in the right lower extremity. He was felt to have some degree of lymphedema and he was felt to be a candidate for compression pumps. Finally a diagnostic arteriogram was suggested which is really what I'm most interested in. The patient has wife wanted time to think about this, I think they were confused about interacting between venous and arterial discussions. I think it would be probably well worth going through the angiogram. Culture I did have the deeper area on the right lateral [name] showed a few Enterococcus faecalis. I would like to start her on amoxicillin which they will start today for one-week 500 3 times a day 06/17/17; the patient still has punched out areas on the right lateral [name] and right lateral malleolus. There is not a viable surface here. We have been using Santyl. He also has an area on the plantar aspect of his left first metatarsal head this also seems to be better 06/24/17; patient's angiogram as on 07/06/17; still has punched out areas on the right lateral [name] and right lateral malleolus to which we've been applying Santyl-based  dressings. He also has a more superficial area on the left plantar first metatarsal head, using collagen here 07/08/17; the patient had his angiogram. This perineal artery had a 70-80% stenosis. Similarly the posterior tibial artery also was diseased. Furthermore down the posterior tibial artery in the distal segment was a short stenosis of 80%. His major vessels in his thigh and only minor irregularity. He had percutaneous angioplasty of the proximal right peroneal artery. Also angioplasty of the tibial peroneal trunk and proximal posterior tibial artery as well as the mid to distal segment of the posterior tibial artery. He handled this remarkably well. The patient arrived with a new wound on his right anterior calf which I think is the reopening of one of the original small open areas 07/15/17; the areas on his right anterior calf is new. He has a new threatened area on the right tibia more superiorly which is not open yet but makes me wonder whether this is going to reopen as well. His original wounds on the right lateral [name] and right lateral malleolus are somewhat better in terms of surface [name], Jermaine E. ([id number]) 07/22/17; the patient has a second open area on the right anterior. This was a threatened area superiorly last week. Each time this happens she has a small wound with a nephrotic cover. The areas on the right lateral [name] and right lateral malleolus are about the same. I did not attempt debridement in any of these today continuing  with Santyl. oThe area on the left second metatarsal head looks better and he is healed laceration injuries on the arm from a fall last week with only the ventral forearm wound left 08/05/17 08/05/17; 2 week follow-up. This is a patient who initially presented with a multitude of small punched out areas on the right anterior calf greater than left dorsal [name]. This was in the setting of a constitutional illness at which time he saw  multiple specialists was worked up for various rheumatologic diseases including temporal arteritis. Most of the areas on the right leg and a few on the left actually closed over however he he developed 2 small deep probing areas on the right lateral [name] and right lateral malleolus. He also has an area on the left plantar first metatarsal head which is gradually been getting better. He was revascularized by Dr. dew about 4 weeks ago. He went to see Dr. Nehemiah Massed on 07/30/17; from review the note in time to the patient there wasn't an obvious cause. He did do a shave biopsy of the right anterior leg ulcer rule out cancer or other etiologies such as some embolic. This looks like some form of vasculopathy to me although trying to explain why it so much worse on the right leg than the left is been difficult. We will await the biopsy results. Dr. Nehemiah Massed wanted to put Bactroban and this not sure what that will do for the areas that have a completely nonviable surface. I'd like to go back to [place]. 08/19/17-He is here in follow up evaluation for multiple ulcerations to the right lower extremity and right [name] and the left planter first metatarsal head ulcer. He has had biopsy to the right lateral proximal leg; no results available for review, patient and spouse report "negative" biopsy. We will continue with current treatment plan, using ace wrap compression to the right leg as his 15-37mHg compression stockings are providing suboptimal edema control. He will follow up in two weeks 09/02/17; the patient's biopsy done by dermatology/Dr. KNehemiah Massedwas apparently "negative". He has 2 open areas on the right calf one was the biopsy site on the right lateral calf. Most problematic wounds are on the right lateral malleolus and on the right lateral [name]. Both of these have some depth we've been using Santyl here. Also now a small open area remaining on the left plantar [name] 09/16/17 ; every week follow-up. The  patient has been using Santyl to the area on the right lateral ankle and right lateral [name]. All the areas on the right calf including the biopsy site are closed. He still has the area on the first metatarsal head on the left. We've been using collagen to this as well oBiweekly visit. The area on the left first metatarsal head is just about closed. We have a new tape injury on the dorsal left [name] that was caused by her nurse taking off his dressing. This is superficial. oHe still has difficult areas on the right lateral [name] and right lateral malleolus. He been using Endoform. I was hoping we were going to see better results than today. Still required debridement. I'm going to put Oasis through his insurance 10/14/17; the patient arrives today with really not a lot of change in the small punched-out areas on the right lateral [name] and right lateral malleolus. I have been using Endoform. He is also apparently okay for Oasis as he has a good secondary insurance to [employer] Nevertheless the wound bed is not ready for  application Electronic Signature(s) Signed: 10/14/2017 6:14:59 PM By: Linton Ham MD Entered By: Linton Ham on 10/14/2017 09:42:22 [name], [name] ([id number]) -------------------------------------------------------------------------------- Physical Exam Details Patient Name: [name] Date of Service: 10/14/2017 8:30 AM Medical Record Number: [id number] Patient Account Number: [id number] Date of Birth/Sex: [birth year] (77 y.o. M) Treating RN: Cornell Barman Primary Care Provider: Cyndi Bender Other Clinician: Referring Provider: Cyndi Bender Treating Provider/Extender: Tito Dine in Treatment: 69 Constitutional Patient is hypotensive.However appears very well. Notes Wound examothe 2 open areas on the right lateral malleolus and right dorsal [name] once again have necrotic debris very difficult to debride. Using a #3 curet I was able to get the wound  beds to healthy surfaces although from week to week we are not making a lot of progress with this. Certainly not ready for an advanced treatment option Electronic Signature(s) Signed: 10/14/2017 6:14:59 PM By: Linton Ham MD Entered By: Linton Ham on 10/14/2017 09:50:43 [name], [name] ([id number]) -------------------------------------------------------------------------------- Physician Orders Details Patient Name: [name] Date of Service: 10/14/2017 8:30 AM Medical Record Number: [id number] Patient Account Number: [id number] Date of Birth/Sex: 10/14/1940 (77 y.o. M) Treating RN: Cornell Barman Primary Care Provider: Cyndi Bender Other Clinician: Referring Provider: Cyndi Bender Treating Provider/Extender: Tito Dine in Treatment: 56 Verbal / Phone Orders: No Diagnosis Coding Wound Cleansing Wound #17 Left,Plantar [name] o Clean wound with Normal Saline. Wound #6 Right,Lateral Malleolus o Clean wound with Normal Saline. Wound #8 Right,Lateral [name] o Clean wound with Normal Saline. Wound #9 Left Metatarsal head first o Clean wound with Normal Saline. Anesthetic (add to Medication List) Wound #17 Left,Plantar [name] o Topical Lidocaine 4% cream applied to wound bed prior to debridement (In Clinic Only). Wound #6 Right,Lateral Malleolus o Topical Lidocaine 4% cream applied to wound bed prior to debridement (In Clinic Only). Wound #8 Right,Lateral [name] o Topical Lidocaine 4% cream applied to wound bed prior to debridement (In Clinic Only). Wound #9 Left Metatarsal head first o Topical Lidocaine 4% cream applied to wound bed prior to debridement (In Clinic Only). Primary Wound Dressing Wound #6 Right,Lateral Malleolus o Other: - endoform Wound #8 Right,Lateral [name] o Other: - endoform Wound #9 Left Metatarsal head first o Alginate Secondary Dressing Wound #17 Left,Plantar [name] o ABD pad o Conform/Kerlix Wound #6  Right,Lateral Malleolus o ABD pad o Conform/Kerlix Kilner, Tiny E. ([id number]) Wound #8 Right,Lateral [name] o ABD pad o Conform/Kerlix Wound #9 Left Metatarsal head first o ABD pad o Conform/Kerlix Dressing Change Frequency Wound #17 Left,Plantar [name] o Change Dressing Monday, Wednesday, Friday Wound #6 Right,Lateral Malleolus o Change Dressing Monday, Wednesday, Friday Wound #8 Right,Lateral [name] o Change Dressing Monday, Wednesday, Friday Wound #9 Left Metatarsal head first o Change Dressing Monday, Wednesday, Friday Follow-up Appointments Wound #17 Left,Plantar [name] o Return Appointment in 2 weeks. Wound #6 Right,Lateral Malleolus o Return Appointment in 2 weeks. Wound #8 Right,Lateral [name] o Return Appointment in 2 weeks. Wound #9 Left Metatarsal head first o Return Appointment in 2 weeks. Home Health Wound #17 [place] Visits - Monday, Wednesday and Friday on weeks patient does not come to wound care center. o Home Health Nurse may visit PRN to address patientos wound care needs. o FACE TO FACE ENCOUNTER: MEDICARE and MEDICAID PATIENTS: I certify that this patient is under my care and that I had a face-to-face encounter that meets the physician face-to-face encounter requirements with this patient on this date. The encounter with the patient was in  whole or in part for the following MEDICAL CONDITION: (primary reason for Home Healthcare) MEDICAL NECESSITY: I certify, that based on my findings, NURSING services are a medically necessary home health service. HOME BOUND STATUS: I certify that my clinical findings support that this patient is homebound (i.e., Due to illness or injury, pt requires aid of supportive devices such as crutches, cane, wheelchairs, walkers, the use of special transportation or the assistance of another person to leave their place of residence. There is a normal inability to leave  the home and doing so requires considerable and taxing effort. Other absences are for medical reasons / religious services and are infrequent or of short duration when for other reasons). o If current dressing causes regression in wound condition, may D/C ordered dressing product/s and apply Normal Saline Moist Dressing daily until next [place] / Other MD appointment. [place] of regression in wound condition at [phone]. o Please direct any NON-WOUND related issues/requests for orders to patient's Primary Care Physician Wound #6 Right,Lateral Malleolus [name], [name] ([id number]) o [place] Visits - Monday, Wednesday and Friday on weeks patient does not come to wound care center. o Home Health Nurse may visit PRN to address patientos wound care needs. o FACE TO FACE ENCOUNTER: MEDICARE and MEDICAID PATIENTS: I certify that this patient is under my care and that I had a face-to-face encounter that meets the physician face-to-face encounter requirements with this patient on this date. The encounter with the patient was in whole or in part for the following MEDICAL CONDITION: (primary reason for [place]) MEDICAL NECESSITY: I certify, that based on my findings, NURSING services are a medically necessary home health service. HOME BOUND STATUS: I certify that my clinical findings support that this patient is homebound (i.e., Due to illness or injury, pt requires aid of supportive devices such as crutches, cane, wheelchairs, walkers, the use of special transportation or the assistance of another person to leave their place of residence. There is a normal inability to leave the home and doing so requires considerable and taxing effort. Other absences are for medical reasons / religious services and are infrequent or of short duration when for other reasons). o If current dressing causes regression in wound condition, may D/C  ordered dressing product/s and apply Normal Saline Moist Dressing daily until next [place] / Other MD appointment. [place] of regression in wound condition at [phone]. o Please direct any NON-WOUND related issues/requests for orders to patient's Primary Care Physician Wound #8 [place] Visits - Monday, Wednesday and Friday on weeks patient does not come to wound care center. o Home Health Nurse may visit PRN to address patientos wound care needs. o FACE TO FACE ENCOUNTER: MEDICARE and MEDICAID PATIENTS: I certify that this patient is under my care and that I had a face-to-face encounter that meets the physician face-to-face encounter requirements with this patient on this date. The encounter with the patient was in whole or in part for the following MEDICAL CONDITION: (primary reason for [place]) MEDICAL NECESSITY: I certify, that based on my findings, NURSING services are a medically necessary home health service. HOME BOUND STATUS: I certify that my clinical findings support that this patient is homebound (i.e., Due to illness or injury, pt requires aid of supportive devices such as crutches, cane, wheelchairs, walkers, the use of special transportation or the assistance of another person to leave their place of residence.  There is a normal inability to leave the home and doing so requires considerable and taxing effort. Other absences are for medical reasons / religious services and are infrequent or of short duration when for other reasons). o If current dressing causes regression in wound condition, may D/C ordered dressing product/s and apply Normal Saline Moist Dressing daily until next [place] / Other MD appointment. [place] of regression in wound condition at [phone]. o Please direct any NON-WOUND related issues/requests for orders to patient's Primary Care  Physician Wound #9 Left Metatarsal head first o [place] Visits - Monday, Wednesday and Friday on weeks patient does not come to wound care center. o Home Health Nurse may visit PRN to address patientos wound care needs. o FACE TO FACE ENCOUNTER: MEDICARE and MEDICAID PATIENTS: I certify that this patient is under my care and that I had a face-to-face encounter that meets the physician face-to-face encounter requirements with this patient on this date. The encounter with the patient was in whole or in part for the following MEDICAL CONDITION: (primary reason for [place]) MEDICAL NECESSITY: I certify, that based on my findings, NURSING services are a medically necessary home health service. HOME BOUND STATUS: I certify that my clinical findings support that this patient is homebound (i.e., Due to illness or injury, pt requires aid of supportive devices such as crutches, cane, wheelchairs, walkers, the use of special transportation or the assistance of another person to leave their place of residence. There is a normal inability to leave the home and doing so requires considerable and taxing effort. Other absences are for medical reasons / religious services and are infrequent or of short duration when for other reasons). o If current dressing causes regression in wound condition, may D/C ordered dressing product/s and apply Normal Saline Moist Dressing daily until next [place] / Other MD appointment. [place] of regression in wound condition at [phone]. o Please direct any NON-WOUND related issues/requests for orders to patient's Primary Care Physician Electronic Signature(s) [name], [name] ([id number]) Signed: 10/14/2017 5:34:51 PM By: Gretta Cool, BSN, RN, CWS, Kim RN, BSN Signed: 10/14/2017 6:14:59 PM By: Linton Ham MD Entered By: Gretta Cool, BSN, RN, CWS, Kim on 10/14/2017 09:19:42 [name], [name]  ([id number]) -------------------------------------------------------------------------------- Problem List Details Patient Name: [name] Date of Service: 10/14/2017 8:30 AM Medical Record Number: [id number] Patient Account Number: [id number] Date of Birth/Sex: [birth year] (77 y.o. 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 of 07/08/2017 No Yes other part of [name] I25.5 Ischemic cardiomyopathy 04/08/2017 No Yes Inactive Problems ICD-10 Code Description Active Date Inactive Date L97.212 Non-pressure chronic ulcer of right calf with fat layer exposed 04/08/2017 04/08/2017 Resolved Problems ICD-10 Code Description Active Date Resolved Date S41.111D Laceration without foreign body of right upper arm, subsequent 07/15/2017 07/15/2017 encounter Electronic Signature(s) Signed: 10/14/2017 6:14:59 PM By: Linton Ham MD Entered By: Linton Ham on 10/14/2017 09:40:18 [name], [name] ([id number]) -------------------------------------------------------------------------------- Progress Note Details Patient Name: [name] Date of Service: 10/14/2017 8:30 AM Medical Record Number: [id number]  Patient Account Number: [id number] Date of Birth/Sex: Dec 22, 1940 (77 y.o. M) Treating RN: Cornell Barman Primary Care Provider: Cyndi Bender Other Clinician: Referring Provider: Cyndi Bender Treating Provider/Extender: Tito Dine in Treatment: 27 Subjective History of  Present Illness (HPI) 04/08/17; this is a complex 77 year old man referred here from [facility]. He had been referred there for bilateral lower extremity edema with ulcer formation predominantly on the right calf but also the right [name]. He had been receiving Unna boots bilaterally. The history here is long. He is not a diabetic however ICU looking through late 2018 he was worked up for chronic headaches, elevated inflammatory markers including C-reactive protein and ESR. He went on to actually have a left temporal artery biopsy wasn't that was negative.he received about 6 weeks of high-dose prednisone 60 mg with improvement in his inflammatory markers. He was admitted to hospital in late November with ventricular tachycardia syncope. He has known ischemic cardiomyopathy. He was admitted in the hospital in mid December. Apparently this was precipitated by a syncopal spell falling out of his scooter while at [place]. There was ventricular arrhythmia. He has an implantable defibrillator and echocardiogram showed severe LV dysfunction with an EF of 20% and valvular regurgitations including mild AR, moderate MR. There was no stenosis. He ruled in for a non-ST elevation MI in the setting of V. tach.his wife states that sometime during this hospitalization she noted multiple areas of skin change on the right lower calf which became evident just after he left the hospital. He was back in hospital in February with acute renal failure hyponatremia. This responded to fluid resuscitation.. Interestingly I can't see much description of his right leg at that point in time.he was followed by Dr. Nehemiah Massed of dermatology for the necrotic wounds on his right leg. Apparently a biopsy was planned at one point but not done although in some notes that suggests it was. I cannot see these results area He was noted to have a lot of edema. Was treated with bilateral Unna boots edges really helped with the  swelling they have been using Bactroban to small open areas predominantly on the right anterior lower leg His history is complicated by the fact that he has rheumatoid arthritis followed by rheumatology. He is followed by neurology for disabling headaches. At one point this was felt to be giant cell arteritis although a left temporal biopsy was apparently negative. He was given a prolonged course of prednisone at 60 mg which managed his sedimentation rates but apparently did not prove improve the headaches. This is been tapered to off on by rheumatology on 03/13/17 Vascular had plans to do a venous reflux workup as well as arterial studies in May. They also wanted to get him a lymphedema pump. As mentioned he's been using bacitracin under Unna boot wraps to both lower legs 04/15/17; the patient arrives with most of his wounds improved. These are small punched out wounds. Most of them remaining ones are on the right anterior calf with the most problematic over the right lateral malleolus. There are no new areas. The symptom complex or potential symptom complex we are dealing with his chronic disabling headaches with inflammatory markers not responsive to prednisone and with a negative temporal biopsy, lower extremity weakness, skin ulcerations just on the right leg. We have managed to get his arterial studies moved up to April 30. He has a rheumatology consult at [facility] in June. He has seen dermatology locally, rheumatology locally, neurology locally. 04/22/17;  small punched out areas on the right leg anteriorly posteriorly. Most of these appear to have closed over. Some of them have eschar over the surface. The most problematic area appears to be over the right lateral malleolus. We've been using prisma to all of this. He has arterial studies on April 30 and a rheumatology consult at [facility] on June 28 04/29/17; most of the small punched out areas on the right leg posteriorly are closed. He has 2 or 3  openings anteriorly but most of these appear to be on the way to closing. Still problematically over the right lateral malleolus and right lateral [name] with almost ischemic-looking eschar. His arterial studies that I ordered are due to be done next week on the 30th so we should have them available for our next visit hopefully. He also saw a rheumatologist at [facility] and according to the patient he did 8 vials of blood. Finally he has scaling rash on his left [name] and what looks to be at [facility] area on the left anterior leg 05/06/17; most of the small punched out areas on the right leg posteriorly and anteriorly are closed. He still has one small one [name], Jermaine E. ([id number]) anteriorly one over the right lateral malleolus and one over the right lateral [name]. He had his arterial studies they didn't seem to do waveform analysis not exactly sure why however in any case is ABIs were noncompressible bilaterally. They did provide TBIs although looking at the pressures it appears that his TBIs are quite normal. I therefore went ahead and debrided the area over the right lateral malleolus and the right lateral [name] 05/13/17; most of the small punched out areas on the right leg posteriorly and anteriorly are closed. He continues to have problematic areas over the right lateral malleolus and the right lateral [name]. He still requiring aggressive debridement of these 2 wounds using silver collagen I reviewed the note from rheumatology I don't think they came up with a specific diagnosis although he is known to have seropositive RA. They did a panel of lab work when I was able to see his his AMA was negative, anti-smooth muscle antibodies negative antineutrophil cytoplasmic antibodies negative,Liv/kia type 1 negative. Serum C3 and C4 were negative. I don't see his muscle enzymes specifically. He was referred back to his local rheumatologist for management of his known rheumatoid arthritis.the patient states he  still feels weak and fatigued. He states he has numbness in both feet and apparently is known to have neuropathy 05/20/17; the patient continues to have a difficult problem on the right lateral malleolus and the right lateral [name]. Both of these wounds have no viable surface even with attempts at debridement. He has a new wound on the left plantar metatarsal head which looks more like a superficial diabetic pressure related injury then part of this underlying issue he has. Most of the rest of the wounds on his legs look satisfactory. Mostly on the right calf.reviewed his arterial studies which showed noncompressible vessels bilaterally but the TBIs were quite normal. 05/27/17; no real improvement in the right lateral malleolus and right lateral [name]. In fact the right lateral [name] is now on bone. I gave him doxycycline empirically last week it appears that he developed photosensitivity was 12 the son in a tractor. I'll not give him any more of this. This is predominantly on his face and dorsal forearms and hands. I given this more as an anti- inflammatory. I'm going to have him seen by vascular  surgery. His TBIs that he had done previously ordered by Dr. Brigitte Pulse were in the normal range They never did full arterial studies on him. he now has small punched out wounds on the right lateral ankle and right lateral [name]. I doubt these are ischemic however I would like a review of his macrovascular status. He does describe some pain at night. I'll reduce the compression from 3 layer to 2 layer and I'm not convinced that this is a macrovascular issue however I want to make sure. We've been using silver alginate 06/03/17; the patient's x-rays that I ordered last time of his right ankle and right lateral [name] did not show definite osteomyelitis. We've been using silver alginate. The wounds are not making any progress. The area on the plantar left first metatarsal head however appears to be better. The patient  complains of weakness that is more of the fatigue. He says if he's walking his head will fall onto his chest and that his legs literally gave out on him. He did see neurology in the past however that was at a time where his workup was for temporal arteritis and headaches. 06/10/17; the patient is making no progress with the areas on the right lateral [name] and right lateral malleolus. In fact the area on the [name] probes to bone. X-ray did not show osteomyelitis. He went and saw vein and vascular on 06/04/17 he was felt to have significant reflux in the left greater saphenous vein over this is not in the area we are most concerned about. He could be offered ablation. He was not felt to have venous reflux noted in the right lower extremity. He was felt to have some degree of lymphedema and he was felt to be a candidate for compression pumps. Finally a diagnostic arteriogram was suggested which is really what I'm most interested in. The patient has wife wanted time to think about this, I think they were confused about interacting between venous and arterial discussions. I think it would be probably well worth going through the angiogram. Culture I did have the deeper area on the right lateral [name] showed a few Enterococcus faecalis. I would like to start her on amoxicillin which they will start today for one-week 500 3 times a day 06/17/17; the patient still has punched out areas on the right lateral [name] and right lateral malleolus. There is not a viable surface here. We have been using Santyl. He also has an area on the plantar aspect of his left first metatarsal head this also seems to be better 06/24/17; patient's angiogram as on 07/06/17; still has punched out areas on the right lateral [name] and right lateral malleolus to which we've been applying Santyl-based dressings. He also has a more superficial area on the left plantar first metatarsal head, using collagen here 07/08/17; the patient had his  angiogram. This perineal artery had a 70-80% stenosis. Similarly the posterior tibial artery also was diseased. Furthermore down the posterior tibial artery in the distal segment was a short stenosis of 80%. His major vessels in his thigh and only minor irregularity. He had percutaneous angioplasty of the proximal right peroneal artery. Also angioplasty of the tibial peroneal trunk and proximal posterior tibial artery as well as the mid to distal segment of the posterior tibial artery. He handled this remarkably well. The patient arrived with a new wound on his right anterior calf which I think is the reopening of one of the original small open areas 07/15/17; the areas  on his right anterior calf is new. He has a new threatened area on the right tibia more superiorly which is not open yet but makes me wonder whether this is going to reopen as well. His original wounds on the right lateral [name] and [place], Sharon E. ([id number]) right lateral malleolus are somewhat better in terms of surface 07/22/17; the patient has a second open area on the right anterior. This was a threatened area superiorly last week. Each time this happens she has a small wound with a nephrotic cover. The areas on the right lateral [name] and right lateral malleolus are about the same. I did not attempt debridement in any of these today continuing with Santyl. The area on the left second metatarsal head looks better and he is healed laceration injuries on the arm from a fall last week with only the ventral forearm wound left 08/05/17 08/05/17; 2 week follow-up. This is a patient who initially presented with a multitude of small punched out areas on the right anterior calf greater than left dorsal [name]. This was in the setting of a constitutional illness at which time he saw multiple specialists was worked up for various rheumatologic diseases including temporal arteritis. Most of the areas on the right leg and a few on the left  actually closed over however he he developed 2 small deep probing areas on the right lateral [name] and right lateral malleolus. He also has an area on the left plantar first metatarsal head which is gradually been getting better. He was revascularized by Dr. dew about 4 weeks ago. He went to see Dr. Nehemiah Massed on 07/30/17; from review the note in time to the patient there wasn't an obvious cause. He did do a shave biopsy of the right anterior leg ulcer rule out cancer or other etiologies such as some embolic. This looks like some form of vasculopathy to me although trying to explain why it so much worse on the right leg than the left is been difficult. We will await the biopsy results. Dr. Nehemiah Massed wanted to put Bactroban and this not sure what that will do for the areas that have a completely nonviable surface. I'd like to go back to [place]. 08/19/17-He is here in follow up evaluation for multiple ulcerations to the right lower extremity and right [name] and the left planter first metatarsal head ulcer. He has had biopsy to the right lateral proximal leg; no results available for review, patient and spouse report "negative" biopsy. We will continue with current treatment plan, using ace wrap compression to the right leg as his 15-40mHg compression stockings are providing suboptimal edema control. He will follow up in two weeks 09/02/17; the patient's biopsy done by dermatology/Dr. KNehemiah Massedwas apparently "negative". He has 2 open areas on the right calf one was the biopsy site on the right lateral calf. Most problematic wounds are on the right lateral malleolus and on the right lateral [name]. Both of these have some depth we've been using Santyl here. Also now a small open area remaining on the left plantar [name] 09/16/17 ; every week follow-up. The patient has been using Santyl to the area on the right lateral ankle and right lateral [name]. All the areas on the right calf including the biopsy site are  closed. He still has the area on the first metatarsal head on the left. We've been using collagen to this as well Biweekly visit. The area on the left first metatarsal head is just about closed. We have  a new tape injury on the dorsal left [name] that was caused by her nurse taking off his dressing. This is superficial. He still has difficult areas on the right lateral [name] and right lateral malleolus. He been using Endoform. I was hoping we were going to see better results than today. Still required debridement. I'm going to put Oasis through his insurance 10/14/17; the patient arrives today with really not a lot of change in the small punched-out areas on the right lateral [name] and right lateral malleolus. I have been using Endoform. He is also apparently okay for Oasis as he has a good secondary insurance to [employer]. Nevertheless the wound bed is not ready for application Objective Constitutional Patient is hypotensive.However appears very well. Vitals Time Taken: 8:37 AM, Height: 68 in, Temperature: 97.6 F, Pulse: 75 bpm, Respiratory Rate: 16 breaths/min, Blood Pressure: 99/52 mmHg. [name], [name] ([id number]) General Notes: Wound exam the 2 open areas on the right lateral malleolus and right dorsal [name] once again have necrotic debris very difficult to debride. Using a #3 curet I was able to get the wound beds to healthy surfaces although from week to week we are not making a lot of progress with this. Certainly not ready for an advanced treatment option Integumentary (Hair, Skin) Wound #17 status is Healed - Epithelialized. Original cause of wound was Shear/Friction. The wound is located on the [place]. The wound measures 0cm length x 0cm width x 0cm depth; 0cm^2 area and 0cm^3 volume. There is a none present amount of drainage noted. The wound margin is flat and intact. There is no granulation within the wound bed. There is no necrotic tissue within the wound bed. The  periwound skin appearance exhibited: Callus. The periwound skin appearance did not exhibit: Crepitus, Excoriation, Induration, Rash, Scarring, Dry/Scaly, Maceration, Atrophie Blanche, Cyanosis, Ecchymosis, Hemosiderin Staining, Mottled, Pallor, Rubor, Erythema. Wound #6 status is Open. Original cause of wound was Gradually Appeared. The wound is located on the Right,Lateral Malleolus. The wound measures 0.4cm length x 0.4cm width x 0.2cm depth; 0.126cm^2 area and 0.025cm^3 volume. There is no tunneling or undermining noted. There is a medium amount of serous drainage noted. There is no granulation within the wound bed. There is a medium (34-66%) amount of necrotic tissue within the wound bed including Adherent Slough. The periwound skin appearance exhibited: Maceration. Wound #8 status is Open. Original cause of wound was Gradually Appeared. The wound is located on the Right,Lateral [name]. The wound measures 0.5cm length x 0.5cm width x 0.3cm depth; 0.196cm^2 area and 0.059cm^3 volume. There is undermining starting at 10:00 and ending at 3:00 with a maximum distance of 0.5cm. There is a medium amount of drainage noted. There is no granulation within the wound bed. There is a large (67-100%) amount of necrotic tissue within the wound bed including Adherent Slough. The periwound skin appearance exhibited: Maceration. Wound #9 status is Healed - Epithelialized. Original cause of wound was Gradually Appeared. The wound is located on the Left Metatarsal head first. The wound measures 0cm length x 0cm width x 0cm depth; 0cm^2 area and 0cm^3 volume. There is no tunneling or undermining noted. There is a none present amount of drainage noted. There is no granulation within the wound bed. There is no necrotic tissue within the wound bed. The periwound skin appearance exhibited: Callus. The periwound skin appearance did not exhibit: Crepitus, Excoriation, Induration, Rash, Scarring, Dry/Scaly, Maceration,  Atrophie Blanche, Cyanosis, Ecchymosis, Hemosiderin Staining, Mottled, Pallor, Rubor, Erythema. 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 There was a Excisional Skin/Subcutaneous Tissue Debridement with a total area of 0.16 sq cm performed by Ricard Dillon, MD. With the following instrument(s): Curette to remove Viable and Sumpter, Archimedes E. ([id number]) Non-Viable tissue/material. Material removed includes Subcutaneous Tissue and Slough and after achieving pain control using Other (lidocaine 4%). No specimens were taken. A time out was conducted at 09:14, prior to the start of the procedure. A Minimum amount of bleeding was controlled with Pressure. The procedure was tolerated well. Post Debridement Measurements: 0.4cm length x 0.4cm width x 0.3cm depth; 0.038cm^3 volume. Character of Wound/Ulcer Post Debridement is stable. Severity of Tissue Post Debridement is: Fat layer exposed. Post procedure Diagnosis Wound #6: Same as Pre-Procedure Wound #8 Pre-procedure diagnosis of Wound #8 is an Arterial Insufficiency Ulcer located on the Right,Lateral [name] .Severity of Tissue Pre Debridement is: Fat layer exposed. There was a Excisional Skin/Subcutaneous Tissue Debridement with a total area of 0.25 sq cm performed by Ricard Dillon, MD. With the following instrument(s): Curette to remove Viable and Non- Viable tissue/material. Material removed includes Subcutaneous Tissue and Slough and after achieving pain control using Other (lidocaine 4%). No  specimens were taken. A time out was conducted at 09:14, prior to the start of the procedure. A Minimum amount of bleeding was controlled with Pressure. The procedure was tolerated well. Post Debridement Measurements: 0.5cm length x 0.6cm width x 0.4cm depth; 0.094cm^3 volume. Character of Wound/Ulcer Post Debridement is stable. Severity of Tissue Post Debridement is: Fat layer exposed. Post procedure Diagnosis Wound #8: Same as Pre-Procedure Plan Wound Cleansing: Wound #17 Left,Plantar [name]: Clean wound with Normal Saline. Wound #6 Right,Lateral Malleolus: Clean wound with Normal Saline. Wound #8 Right,Lateral [name]: Clean wound with Normal Saline. Wound #9 Left Metatarsal head first: Clean wound with Normal Saline. Anesthetic (add to Medication List): Wound #17 Left,Plantar [name]: Topical Lidocaine 4% cream applied to wound bed prior to debridement (In Clinic Only). Wound #6 Right,Lateral Malleolus: Topical Lidocaine 4% cream applied to wound bed prior to debridement (In Clinic Only). Wound #8 Right,Lateral [name]: Topical Lidocaine 4% cream applied to wound bed prior to debridement (In Clinic Only). Wound #9 Left Metatarsal head first: Topical Lidocaine 4% cream applied to wound bed prior to debridement (In Clinic Only). Primary Wound Dressing: Wound #6 Right,Lateral Malleolus: Other: - endoform Wound #8 Right,Lateral [name]: Other: - endoform Wound #9 Left Metatarsal head first: Alginate Secondary Dressing: Wound #17 Left,Plantar [name]: ABD pad Conform/Kerlix Wound #6 Right,Lateral Malleolus: ABD pad Conform/Kerlix Wound #8 Right,Lateral [name]: Cervenka, Macallan E. ([id number]) ABD pad Conform/Kerlix Wound #9 Left Metatarsal head first: ABD pad Conform/Kerlix Dressing Change Frequency: Wound #17 Left,Plantar [name]: Change Dressing Monday, Wednesday, Friday Wound #6 Right,Lateral Malleolus: Change Dressing Monday, Wednesday, Friday Wound #8 Right,Lateral [name]: Change  Dressing Monday, Wednesday, Friday Wound #9 Left Metatarsal head first: Change Dressing Monday, Wednesday, Friday Follow-up Appointments: Wound #17 Left,Plantar [name]: Return Appointment in 2 weeks. Wound #6 Right,Lateral Malleolus: Return Appointment in 2 weeks. Wound #8 Right,Lateral [name]: Return Appointment in 2 weeks. Wound #9 Left Metatarsal head first: Return Appointment in 2 weeks. Home  Health: Wound #17 Left,Plantar [name]: [place] Visits - Monday, Wednesday and Friday on weeks patient does not come to wound care center. Home Health Nurse may visit PRN to address patient s wound care needs. FACE TO FACE ENCOUNTER: MEDICARE and MEDICAID PATIENTS: I certify that this patient is under my care and that I had a face-to-face encounter that meets the physician face-to-face encounter requirements with this patient on this date. The encounter with the patient was in whole or in part for the following MEDICAL CONDITION: (primary reason for [place]) MEDICAL NECESSITY: I certify, that based on my findings, NURSING services are a medically necessary home health service. HOME BOUND STATUS: I certify that my clinical findings support that this patient is homebound (i.e., Due to illness or injury, pt requires aid of supportive devices such as crutches, cane, wheelchairs, walkers, the use of special transportation or the assistance of another person to leave their place of residence. There is a normal inability to leave the home and doing so requires considerable and taxing effort. Other absences are for medical reasons / religious services and are infrequent or of short duration when for other reasons). If current dressing causes regression in wound condition, may D/C ordered dressing product/s and apply Normal Saline Moist Dressing daily until next [place] / Other MD appointment. [place] of regression in wound condition at [phone]. Please  direct any NON-WOUND related issues/requests for orders to patient's Primary Care Physician Wound #6 Right,Lateral Malleolus: [place] Visits - Monday, Wednesday and Friday on weeks patient does not come to wound care center. Home Health Nurse may visit PRN to address patient s wound care needs. FACE TO FACE ENCOUNTER: MEDICARE and MEDICAID PATIENTS: I certify that this patient is under my care and that I had a face-to-face encounter that meets the physician face-to-face encounter requirements with this patient on this date. The encounter with the patient was in whole or in part for the following MEDICAL CONDITION: (primary reason for [place]) MEDICAL NECESSITY: I certify, that based on my findings, NURSING services are a medically necessary home health service. HOME BOUND STATUS: I certify that my clinical findings support that this patient is homebound (i.e., Due to illness or injury, pt requires aid of supportive devices such as crutches, cane, wheelchairs, walkers, the use of special transportation or the assistance of another person to leave their place of residence. There is a normal inability to leave the home and doing so requires considerable and taxing effort. Other absences are for medical reasons / religious services and are infrequent or of short duration when for other reasons). If current dressing causes regression in wound condition, may D/C ordered dressing product/s and apply Normal Saline Moist Dressing daily until next [place] / Other MD appointment. [place] of regression in wound condition at [phone]. Please direct any NON-WOUND related issues/requests for orders to patient's Primary Care Physician Wound #8 Right,Lateral [name]: [place] Visits - Monday, Wednesday and Friday on weeks patient does not come to wound care center. Home Health Nurse may visit PRN to address patient s wound care needs. FACE TO FACE  ENCOUNTER: MEDICARE and MEDICAID PATIENTS: I certify that this patient is under my care and that I Crouse, Navid E. ([id number]) had a face-to-face encounter that meets the physician face-to-face encounter requirements with this patient on this date. The encounter with the patient was in whole or in part for the following MEDICAL CONDITION: (  primary reason for Home Healthcare) MEDICAL NECESSITY: I certify, that based on my findings, NURSING services are a medically necessary home health service. HOME BOUND STATUS: I certify that my clinical findings support that this patient is homebound (i.e., Due to illness or injury, pt requires aid of supportive devices such as crutches, cane, wheelchairs, walkers, the use of special transportation or the assistance of another person to leave their place of residence. There is a normal inability to leave the home and doing so requires considerable and taxing effort. Other absences are for medical reasons / religious services and are infrequent or of short duration when for other reasons). If current dressing causes regression in wound condition, may D/C ordered dressing product/s and apply Normal Saline Moist Dressing daily until next [place] / Other MD appointment. [place] of regression in wound condition at [phone]. Please direct any NON-WOUND related issues/requests for orders to patient's Primary Care Physician Wound #9 Left Metatarsal head first: [place] Visits - Monday, Wednesday and Friday on weeks patient does not come to wound care center. Home Health Nurse may visit PRN to address patient s wound care needs. FACE TO FACE ENCOUNTER: MEDICARE and MEDICAID PATIENTS: I certify that this patient is under my care and that I had a face-to-face encounter that meets the physician face-to-face encounter requirements with this patient on this date. The encounter with the patient was in whole or in part for the  following MEDICAL CONDITION: (primary reason for [place]) MEDICAL NECESSITY: I certify, that based on my findings, NURSING services are a medically necessary home health service. HOME BOUND STATUS: I certify that my clinical findings support that this patient is homebound (i.e., Due to illness or injury, pt requires aid of supportive devices such as crutches, cane, wheelchairs, walkers, the use of special transportation or the assistance of another person to leave their place of residence. There is a normal inability to leave the home and doing so requires considerable and taxing effort. Other absences are for medical reasons / religious services and are infrequent or of short duration when for other reasons). If current dressing causes regression in wound condition, may D/C ordered dressing product/s and apply Normal Saline Moist Dressing daily until next [place] / Other MD appointment. [place] of regression in wound condition at [phone]. Please direct any NON-WOUND related issues/requests for orders to patient's Primary Care Physician #1 I'm continuing with Endoform for another week although if the surface is no better next time I may need to go back to Santyl or Iodoflex. #2 I'm pleased he is covered for [facility] although we are not ready for this now. #3 the area on the plantar aspect of his left first MTP is closed Electronic Signature(s) Signed: 10/14/2017 6:14:59 PM By: Linton Ham MD Entered By: Linton Ham on 10/14/2017 09:52:11 Morency, [name] ([id number]) -------------------------------------------------------------------------------- SuperBill Details Patient Name: [name] Date of Service: 10/14/2017 Medical Record Number: [id number] Patient Account Number: [id number] Date of Birth/Sex: July 13, 1940 (77 y.o. 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 I25.5 Ischemic  cardiomyopathy L97.311 Non-pressure chronic ulcer of right ankle limited to breakdown of skin Facility Procedures CPT4 Code: [id number] Description: 43200 - DEB SUBQ TISSUE 20 SQ CM/< ICD-10 Diagnosis Description L97.512 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 with fat l L97.311 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

## 2017-10-17 NOTE — Progress Notes (Signed)
ZAC, TORTI ([id number]) Visit Report for 10/14/2017 Arrival Information Details Patient Name: [name], [name] Date of Service: 10/14/2017 8:30 AM Medical Record Number: [id number] Patient Account Number: [id number] Date of Birth/Sex: Aug 12, 1940 (76 y.o. M) Treating RN: [name] Primary Care [name]: [name] Other Clinician: Referring [name]: [name] Treating [name]/Extender: [name] in Treatment: 6 Visit Information History Since Last Visit Added or deleted any medications: No Patient Arrived: Ambulatory Any new allergies or adverse reactions: No Arrival Time: 08:37 Had a fall or experienced change in No Accompanied By: wife activities of daily living that may affect Transfer Assistance: None risk of falls: Patient Identification Verified: Yes Signs or symptoms of abuse/neglect since last visito No Secondary Verification Process Completed: Yes Hospitalized since last visit: No Patient Requires Transmission-Based No Implantable device outside of the clinic excluding No Precautions: cellular tissue based products placed in the center Patient Has Alerts: Yes since last visit: Patient Alerts: R ABI >220 Has Dressing in Place as Prescribed: Yes L ABI >220 Pain Present Now: No Electronic Signature(s) Signed: 10/14/2017 4:51:34 PM By: [name] RCP, RRT, CHT Entered By: [name] on 10/14/2017 08:38:35 [name], [name] ([id number]) -------------------------------------------------------------------------------- Encounter Discharge Information Details Patient Name: [name] Date of Service: 10/14/2017 8:30 AM Medical Record Number: [id number] Patient Account Number: [id number] Date of Birth/Sex: 1940/02/24 (76 y.o. M) Treating RN: [name] Primary Care Lolitha Tortora: [name] Other Clinician: Referring Kilie Rund: [name] Treating [name]/Extender: [name] in  Treatment: 5 Encounter Discharge Information Items Discharge Condition: Stable Ambulatory Status: Ambulatory Discharge Destination: Home Transportation: Private Auto Schedule Follow-up Appointment: Yes Clinical Summary of Care: Post Procedure Vitals: Temperature (F): 97.6 Pulse (bpm): 75 Respiratory Rate (breaths/min): 18 Blood Pressure (mmHg): 99/52 Electronic Signature(s) Signed: 10/14/2017 5:34:51 PM By: [name], BSN, RN, CWS, Kim RN, BSN Entered By: [name], BSN, RN, CWS, [name] on 10/14/2017 09:40:35 [name], [name] ([id number]) -------------------------------------------------------------------------------- Lower Extremity Assessment Details Patient Name: [name] Date of Service: 10/14/2017 8:30 AM Medical Record Number: [id number] Patient Account Number: [id number] Date of Birth/Sex: Mar 26, 1940 (76 y.o. M) Treating RN: [name] Primary Care Ayren Zumbro: [name] Other Clinician: Referring Jenea Dake: [name] Treating [name]/Extender: [name] in Treatment: 27 Vascular Assessment Claudication: Claudication Assessment [Left:None] [Right:None] Pulses: Dorsalis Pedis Palpable: [Left:Yes] [Right:Yes] Posterior Tibial Extremity colors, hair growth, and conditions: Extremity Color: [Left:Normal] [Right:Normal] Hair Growth on Extremity: [Left:Yes] [Right:Yes] Temperature of Extremity: [Left:Warm] [Right:Warm] Capillary Refill: [Left:< 3 seconds] [Right:< 3 seconds] Toe Nail Assessment Left: Right: Thick: Yes Yes Discolored: Yes Yes Deformed: Yes Yes Improper Length and Hygiene: No No Electronic Signature(s) Signed: 10/14/2017 11:44:54 AM By: [name] Entered By: [name] on 10/14/2017 08:53:37 [name], [name] ([id number]) -------------------------------------------------------------------------------- Multi Wound Chart Details Patient Name: [name] Date of Service: 10/14/2017 8:30 AM Medical Record Number:  [id number] Patient Account Number: [id number] Date of Birth/Sex: 27-Dec-1940 (76 y.o. M) Treating RN: [name] Primary Care Flynn Lininger: [name] Other Clinician: Referring Maicol Bowland: [name] Treating [name]/Extender: [name] in Treatment: 27 Vital Signs Height(in): 76 Pulse(bpm): 79 Weight(lbs): Blood Pressure(mmHg): 99/52 Body Mass Index(BMI): Temperature(F): 97.6 Respiratory Rate 16 (breaths/min): Photos: Wound Location: Left, Plantar [name] Right Malleolus - Lateral Right [name] - Lateral Wounding Event: Shear/Friction Gradually Appeared Gradually Appeared Primary Etiology: Trauma, Other Arterial Insufficiency Ulcer Arterial Insufficiency Ulcer Comorbid History: Congestive Heart Failure, Congestive Heart Failure, Congestive Heart Failure, Coronary Artery Disease, Coronary Artery Disease, Coronary Artery Disease, Hypertension, Myocardial Hypertension, Myocardial Hypertension,  Myocardial Infarction, Osteoarthritis Infarction, Osteoarthritis Infarction, Osteoarthritis Date Acquired: 09/30/2017 03/23/2017 04/29/2017 Weeks of Treatment: 2 27 24  Wound Status: Healed - Epithelialized Open Open Measurements L x W x D 0x0x0 0.4x0.4x0.2 0.5x0.5x0.3 (cm) Area (cm) : 0 0.126 0.196 Volume (cm) : 0 0.025 0.059 % Reduction in Area: 100.00% -306.50% -176.10% % Reduction in Volume: 100.00% -733.30% -742.90% Starting Position 1 10 (o'clock): Ending Position 1 3 (o'clock): Maximum Distance 1 (cm): 0.5 Undermining: N/A No Yes Classification: Full Thickness Without Full Thickness Without Full Thickness With Exposed Exposed Support Structures Exposed Support Structures Support Structures Exudate Amount: None Present Medium Medium Exudate Type: N/A Serous N/A Exudate Color: N/A amber N/A Wound Margin: Flat and Intact N/A N/A Granulation Amount: None Present (0%) None Present (0%) None Present (0%) [name], Jermaine E. ([id number]) Necrotic Amount: None Present (0%)  Medium (34-66%) Large (67-100%) Exposed Structures: Fascia: No Fascia: No Fascia: No Fat Layer (Subcutaneous Fat Layer (Subcutaneous Fat Layer (Subcutaneous Tissue) Exposed: No Tissue) Exposed: No Tissue) Exposed: No Tendon: No Tendon: No Tendon: No Muscle: No Muscle: No Muscle: No Joint: No Joint: No Joint: No Bone: No Bone: No Bone: No Epithelialization: None N/A N/A Debridement: N/A Debridement - Excisional Debridement - Excisional Pre-procedure N/A 09:14 09:14 Verification/Time Out Taken: Pain Control: N/A Other Other Tissue Debrided: N/A Subcutaneous, Slough Subcutaneous, Slough Level: N/A Skin/Subcutaneous Tissue Skin/Subcutaneous Tissue Debridement Area (sq cm): N/A 0.16 0.25 Instrument: N/A Curette Curette Bleeding: N/A Minimum Minimum Hemostasis Achieved: N/A Pressure Pressure Debridement Treatment N/A Procedure was tolerated well Procedure was tolerated well Response: Post Debridement N/A 0.4x0.4x0.3 0.5x0.6x0.4 Measurements L x W x D (cm) Post Debridement Volume: N/A 0.038 0.094 (cm) Periwound Skin Texture: Callus: Yes No Abnormalities Noted No Abnormalities Noted Excoriation: No Induration: No Crepitus: No Rash: No Scarring: No Periwound Skin Moisture: Maceration: No Maceration: Yes Maceration: Yes Dry/Scaly: No Periwound Skin Color: Atrophie Blanche: No No Abnormalities Noted No Abnormalities Noted Cyanosis: No Ecchymosis: No Erythema: No Hemosiderin Staining: No Mottled: No Pallor: No Rubor: No Tenderness on Palpation: No No No Wound Preparation: Ulcer Cleansing: Ulcer Cleansing: Ulcer Cleansing: Rinsed/Irrigated with Saline Rinsed/Irrigated with Saline Rinsed/Irrigated with Saline Topical Anesthetic Applied: Topical Anesthetic Applied: Topical Anesthetic Applied: Other: lidocaine 4% Other: lidocaine 4% Other: lidocaine 4% Procedures Performed: N/A Debridement Debridement Wound Number: 9 N/A N/A Photos: N/A N/A AKHILESH, SASSONE ([id number]) Wound Location: Left Metatarsal head first N/A N/A Wounding Event: Gradually Appeared N/A N/A Primary Etiology: Pressure Ulcer N/A N/A Comorbid History: Congestive Heart Failure, N/A N/A Coronary Artery Disease, Hypertension, Myocardial Infarction, Osteoarthritis Date Acquired: 05/06/2017 N/A N/A Weeks of Treatment: 21 N/A N/A Wound Status: Healed - Epithelialized N/A N/A Measurements L x W x D 0x0x0 N/A N/A (cm) Area (cm) : 0 N/A N/A Volume (cm) : 0 N/A N/A % Reduction in Area: 100.00% N/A N/A % Reduction in Volume: 100.00% N/A N/A Undermining: No N/A N/A Classification: Category/Stage II N/A N/A Exudate Amount: None Present N/A N/A Exudate Type: N/A N/A N/A Exudate Color: N/A N/A N/A Wound Margin: N/A N/A N/A Granulation Amount: None Present (0%) N/A N/A Necrotic Amount: None Present (0%) N/A N/A Exposed Structures: Fascia: No N/A N/A Fat Layer (Subcutaneous Tissue) Exposed: No Tendon: No Muscle: No Joint: No Bone: No Epithelialization: N/A N/A N/A Debridement: N/A N/A N/A Pain Control: N/A N/A N/A Tissue Debrided: N/A N/A N/A Level: N/A N/A N/A Debridement Area (sq cm): N/A N/A N/A Instrument: N/A N/A N/A Bleeding: N/A N/A N/A Hemostasis Achieved: N/A N/A N/A Debridement Treatment N/A  N/A N/A Response: Post Debridement N/A N/A N/A Measurements L x W x D (cm) Post Debridement Volume: N/A N/A N/A (cm) Periwound Skin Texture: Callus: Yes N/A N/A Excoriation: No Induration: No Crepitus: No Rash: No Scarring: No Periwound Skin Moisture: Maceration: No N/A N/A Dry/Scaly: No Periwound Skin Color: N/A N/A AMROM, ORE ([id number]) Atrophie Blanche: No Cyanosis: No Ecchymosis: No Erythema: No Hemosiderin Staining: No Mottled: No Pallor: No Rubor: No Tenderness on Palpation: No N/A N/A Wound Preparation: Ulcer Cleansing: N/A N/A Rinsed/Irrigated with Saline Topical Anesthetic Applied: Other: lidocaine 4% Procedures Performed:  N/A N/A N/A Treatment Notes Wound #6 (Right, Lateral Malleolus) 1. Cleansed with: Clean wound with Normal Saline 4. Dressing Applied: Other dressing (specify in notes) 5. Secondary Dressing Applied Dry Gauze Kerlix/Conform Notes to apply compression socks Wound #8 (Right, Lateral [name]) 1. Cleansed with: Clean wound with Normal Saline 4. Dressing Applied: Other dressing (specify in notes) 5. Secondary Dressing Applied Dry Gauze Kerlix/Conform Notes to apply compression socks Electronic Signature(s) Signed: 10/14/2017 6:14:59 PM By: Linton Ham MD Entered By: Linton Ham on 10/14/2017 09:40:36 Everett, [name] ([id number]) -------------------------------------------------------------------------------- Multi-Disciplinary Care Plan Details Patient Name: [name] Date of Service: 10/14/2017 8:30 AM Medical Record Number: [id number] Patient Account Number: [id number] Date of Birth/Sex: 1940/03/29 (76 y.o. M) Treating RN: [name] Primary Care Avni Traore: [name] Other Clinician: Referring Delbert Darley: [name] Treating Kiasha Bellin/Extender: [name] in Treatment: 76 Active Inactive ` Abuse / Safety / Falls / Self Care Management Nursing Diagnoses: History of Falls Goals: Patient will remain injury free related to falls Date Initiated: 04/08/2017 Target Resolution Date: 05/08/2017 Goal Status: Active Interventions: Assess fall risk on admission and as needed Notes: ` Orientation to the Wound Care Program Nursing Diagnoses: Knowledge deficit related to the wound healing center program Goals: Patient/caregiver will verbalize understanding of the [place] Program Date Initiated: 04/08/2017 Target Resolution Date: 05/08/2017 Goal Status: Active Interventions: Provide education on orientation to the wound center Notes: ` Soft Tissue Infection Nursing Diagnoses: Impaired tissue integrity Potential for infection: soft  tissue Goals: Patient will remain free of wound infection Date Initiated: 04/08/2017 Target Resolution Date: 05/08/2017 Goal Status: Active MEHDI, GIRONDA ([id number]) Interventions: Assess signs and symptoms of infection every visit Notes: ` Wound/Skin Impairment Nursing Diagnoses: Impaired tissue integrity Goals: Ulcer/skin breakdown will heal within 14 weeks Date Initiated: 04/08/2017 Target Resolution Date: 07/20/2017 Goal Status: Active Interventions: Assess patient/caregiver ability to perform ulcer/skin care regimen upon admission and as needed Provide education on ulcer and skin care Treatment Activities: Topical wound management initiated : 04/08/2017 Notes: Electronic Signature(s) Signed: 10/14/2017 5:34:51 PM By: [name], BSN, RN, CWS, Kim RN, BSN Entered By: [name], BSN, RN, CWS, [name] on 10/14/2017 09:13:56 Lynch, [name] ([id number]) -------------------------------------------------------------------------------- Pain Assessment Details Patient Name: [name] Date of Service: 10/14/2017 8:30 AM Medical Record Number: [id number] Patient Account Number: [id number] Date of Birth/Sex: [birth year] (76 y.o. M) Treating RN: [name] Primary Care Kendrah Lovern: [name] Other Clinician: Referring Khloie Hamada: [name] Treating Patrick Salemi/Extender: [name] in Treatment: 27 Active Problems Location of Pain Severity and Description of Pain Patient Has Paino No Site Locations Pain Management and Medication Current Pain Management: Electronic Signature(s) Signed: 10/14/2017 4:51:34 PM By: [name] RCP, RRT, CHT Signed: 10/14/2017 5:34:51 PM By: [name], BSN, RN, CWS, Kim RN, BSN Entered By: [name] on 10/14/2017 08:38:41 Pavlicek, [name] ([id number]) -------------------------------------------------------------------------------- Patient/Caregiver Education Details Patient Name: [name] Date of  Service: 10/14/2017  8:30 AM Medical Record Number: [id number] Patient Account Number: [id number] Date of Birth/Gender: February 27, 1940 (76 y.o. M) Treating RN: [name] Primary Care Physician: [name] Other Clinician: Referring Physician: Cyndi [name] Treating Physician/Extender: [name] in Treatment: 69 Education Assessment Education Provided To: Patient Education Topics Provided Wound/Skin Impairment: Handouts: Caring for Your Ulcer, Other: continue wound care as prescribed Methods: Demonstration, Explain/Verbal Responses: State content correctly Electronic Signature(s) Signed: 10/14/2017 5:34:51 PM By: [name], BSN, RN, CWS, Kim RN, BSN Entered By: [name], BSN, RN, CWS, [name] on 10/14/2017 09:40:40 Langdon, [name] ([id number]) -------------------------------------------------------------------------------- Wound Assessment Details Patient Name: [name] Date of Service: 10/14/2017 8:30 AM Medical Record Number: [id number] Patient Account Number: [id number] Date of Birth/Sex: [birth year] (76 y.o. M) Treating RN: [name] Primary Care Lunetta Marina: [name] Other Clinician: Referring Olyvia Gopal: [name] Treating Houston Zapien/Extender: [name] in Treatment: 27 Wound Status Wound Number: 17 Primary Trauma, Other Etiology: Wound Location: Left, Plantar [name] Wound Healed - Epithelialized Wounding Event: Shear/Friction Status: Date Acquired: 09/30/2017 Comorbid Congestive Heart Failure, Coronary Artery Weeks Of Treatment: 2 History: Disease, Hypertension, Myocardial Infarction, Clustered Wound: No Osteoarthritis Photos Photo Uploaded By: Montey Hora on 10/14/2017 09:21:27 Wound Measurements Length: (cm) 0 Width: (cm) 0 Depth: (cm) 0 Area: (cm) 0 Volume: (cm) 0 % Reduction in Area: 100% % Reduction in Volume: 100% Epithelialization: None Wound Description Full Thickness Without Exposed  Support Classification: Structures Wound Margin: Flat and Intact Exudate None Present Amount: Foul Odor After Cleansing: No Slough/Fibrino No Wound Bed Granulation Amount: None Present (0%) Exposed Structure Necrotic Amount: None Present (0%) Fascia Exposed: No Fat Layer (Subcutaneous Tissue) Exposed: No Tendon Exposed: No Muscle Exposed: No Joint Exposed: No Bone Exposed: No Periwound Skin Texture Brunton, Arthuro E. ([id number]) Texture Color No Abnormalities Noted: No No Abnormalities Noted: No Callus: Yes Atrophie Blanche: No Crepitus: No Cyanosis: No Excoriation: No Ecchymosis: No Induration: No Erythema: No Rash: No Hemosiderin Staining: No Scarring: No Mottled: No Pallor: No Moisture Rubor: No No Abnormalities Noted: No Dry / Scaly: No Maceration: No Wound Preparation Ulcer Cleansing: Rinsed/Irrigated with Saline Topical Anesthetic Applied: Other: lidocaine 4%, Electronic Signature(s) Signed: 10/14/2017 5:34:51 PM By: [name], BSN, RN, CWS, Kim RN, BSN Entered By: [name], BSN, RN, CWS, [name] on 10/14/2017 09:21:57 Yun, [name] ([id number]) -------------------------------------------------------------------------------- Wound Assessment Details Patient Name: [name] Date of Service: 10/14/2017 8:30 AM Medical Record Number: [id number] Patient Account Number: [id number] Date of Birth/Sex: [birth year] (76 y.o. M) Treating RN: [name] Primary Care Kaysia Willard: [name] Other Clinician: Referring Vann Okerlund: [name] Treating Syair Fricker/Extender: [name] in Treatment: 27 Wound Status Wound Number: 6 Primary Arterial Insufficiency Ulcer Etiology: Wound Location: Right Malleolus - Lateral Wound Open Wounding Event: Gradually Appeared Status: Date Acquired: 03/23/2017 Comorbid Congestive Heart Failure, Coronary Artery Weeks Of Treatment: 27 History: Disease, Hypertension, Myocardial Infarction, Clustered Wound:  No Osteoarthritis Photos Photo Uploaded By: Montey Hora on 10/14/2017 09:20:58 Wound Measurements Length: (cm) 0.4 Width: (cm) 0.4 Depth: (cm) 0.2 Area: (cm) 0.126 Volume: (cm) 0.025 % Reduction in Area: -306.5% % Reduction in Volume: -733.3% Tunneling: No Undermining: No Wound Description Full Thickness Without Exposed Support Foul Classification: Structures Slou Exudate Medium Amount: Exudate Type: Serous Exudate Color: amber Odor After Cleansing: No gh/Fibrino Yes Wound Bed Granulation Amount: None Present (0%) Exposed Structure Necrotic Amount: Medium (34-66%) Fascia Exposed: No Necrotic Quality: Adherent Slough Fat Layer (Subcutaneous Tissue) Exposed: No Tendon Exposed: No Muscle Exposed: No Joint Exposed: No Bone Exposed: No Kovatch, Printice  E. ([id number]) Periwound Skin Texture Texture Color No Abnormalities Noted: No No Abnormalities Noted: No Moisture No Abnormalities Noted: No Maceration: Yes Wound Preparation Ulcer Cleansing: Rinsed/Irrigated with Saline Topical Anesthetic Applied: Other: lidocaine 4%, Treatment Notes Wound #6 (Right, Lateral Malleolus) 1. Cleansed with: Clean wound with Normal Saline 4. Dressing Applied: Other dressing (specify in notes) 5. Secondary Dressing Applied Dry Gauze Kerlix/Conform Notes to apply compression socks Electronic Signature(s) Signed: 10/14/2017 11:44:54 AM By: [name] Entered By: [name] on 10/14/2017 08:49:40 Menard, [name] ([id number]) -------------------------------------------------------------------------------- Wound Assessment Details Patient Name: [name] Date of Service: 10/14/2017 8:30 AM Medical Record Number: [id number] Patient Account Number: [id number] Date of Birth/Sex: 20-Nov-1940 (76 y.o. M) Treating RN: [name] Primary Care Esti Demello: [name] Other Clinician: Referring Danahi Reddish: [name] Treating Doak Mah/Extender: [name] in  Treatment: 27 Wound Status Wound Number: 8 Primary Arterial Insufficiency Ulcer Etiology: Wound Location: Right [name] - Lateral Wound Open Wounding Event: Gradually Appeared Status: Date Acquired: 04/29/2017 Comorbid Congestive Heart Failure, Coronary Artery Weeks Of Treatment: 24 History: Disease, Hypertension, Myocardial Infarction, Clustered Wound: No Osteoarthritis Photos Photo Uploaded By: Montey Hora on 10/14/2017 09:20:58 Wound Measurements Length: (cm) 0.5 Width: (cm) 0.5 Depth: (cm) 0.3 Area: (cm) 0.196 Volume: (cm) 0.059 % Reduction in Area: -176.1% % Reduction in Volume: -742.9% Undermining: Yes Starting Position (o'clock): 10 Ending Position (o'clock): 3 Maximum Distance: (cm) 0.5 Wound Description Full Thickness With Exposed Support Classification: Structures Exudate Medium Amount: Foul Odor After Cleansing: No Slough/Fibrino Yes Wound Bed Granulation Amount: None Present (0%) Exposed Structure Necrotic Amount: Large (67-100%) Fascia Exposed: No Necrotic Quality: Adherent Slough Fat Layer (Subcutaneous Tissue) Exposed: No Tendon Exposed: No Muscle Exposed: No Joint Exposed: No Bone Exposed: No Hack, Levester E. ([id number]) Periwound Skin Texture Texture Color No Abnormalities Noted: No No Abnormalities Noted: No Moisture No Abnormalities Noted: No Maceration: Yes Wound Preparation Ulcer Cleansing: Rinsed/Irrigated with Saline Topical Anesthetic Applied: Other: lidocaine 4%, Treatment Notes Wound #8 (Right, Lateral [name]) 1. Cleansed with: Clean wound with Normal Saline 4. Dressing Applied: Other dressing (specify in notes) 5. Secondary Dressing Applied Dry Gauze Kerlix/Conform Notes to apply compression socks Electronic Signature(s) Signed: 10/14/2017 11:44:54 AM By: [name] Entered By: [name] on 10/14/2017 08:51:13 Nowaczyk, [name]  ([id number]) -------------------------------------------------------------------------------- Wound Assessment Details Patient Name: [name] Date of Service: 10/14/2017 8:30 AM Medical Record Number: [id number] Patient Account Number: [id number] Date of Birth/Sex: [birth year] (76 y.o. M) Treating RN: [name] Primary Care Darci Lykins: [name] Other Clinician: Referring Wendelyn Kiesling: [name] Treating Jacquel Mccamish/Extender: [name] in Treatment: 27 Wound Status Wound Number: 9 Primary Pressure Ulcer Etiology: Wound Location: Left Metatarsal head first Wound Healed - Epithelialized Wounding Event: Gradually Appeared Status: Date Acquired: 05/06/2017 Comorbid Congestive Heart Failure, Coronary Artery Weeks Of Treatment: 21 History: Disease, Hypertension, Myocardial Infarction, Clustered Wound: No Osteoarthritis Photos Photo Uploaded By: Montey Hora on 10/14/2017 09:22:22 Wound Measurements Length: (cm) 0 % R Width: (cm) 0 % R Depth: (cm) 0 Tun Area: (cm) 0 Un Volume: (cm) 0 eduction in Area: 100% eduction in Volume: 100% neling: No dermining: No Wound Description Classification: Category/Stage II Exudate Amount: None Present Foul Odor After Cleansing: No Slough/Fibrino No Wound Bed Granulation Amount: None Present (0%) Exposed Structure Necrotic Amount: None Present (0%) Fascia Exposed: No Fat Layer (Subcutaneous Tissue) Exposed: No Tendon Exposed: No Muscle Exposed: No Joint Exposed: No Bone Exposed: No Periwound Skin Texture Texture Color No Abnormalities Noted: No No Abnormalities Noted: No Terada, Maziah  E. ([id number]) Callus: Yes Atrophie Blanche: No Crepitus: No Cyanosis: No Excoriation: No Ecchymosis: No Induration: No Erythema: No Rash: No Hemosiderin Staining: No Scarring: No Mottled: No Pallor: No Moisture Rubor: No No Abnormalities Noted: No Dry / Scaly: No Maceration: No Wound Preparation Ulcer  Cleansing: Rinsed/Irrigated with Saline Topical Anesthetic Applied: Other: lidocaine 4%, Electronic Signature(s) Signed: 10/14/2017 5:34:51 PM By: [name], BSN, RN, CWS, Kim RN, BSN Entered By: [name], BSN, RN, CWS, [name] on 10/14/2017 09:21:37 Raymundo, [name] ([id number]) -------------------------------------------------------------------------------- Vitals Details Patient Name: [name] Date of Service: 10/14/2017 8:30 AM Medical Record Number: [id number] Patient Account Number: [id number] Date of Birth/Sex: [birth year] (76 y.o. M) Treating RN: [name] Primary Care Riddick Nuon: [name] Other Clinician: Referring Micky Sheller: [name] Treating Sheryle Vice/Extender: [name] in Treatment: 27 Vital Signs Time Taken: 08:37 Temperature (F): 97.6 Height (in): 68 Pulse (bpm): 75 Respiratory Rate (breaths/min): 16 Blood Pressure (mmHg): 99/52 Reference Range: 80 - 120 mg / dl Electronic Signature(s) Signed: 10/14/2017 4:51:34 PM By: [name] RCP, RRT, CHT Entered By: [name] on 10/14/2017 08:41:16

## 2017-10-19 DIAGNOSIS — L97511 Non-pressure chronic ulcer of other part of right foot limited to breakdown of skin: Secondary | ICD-10-CM | POA: Diagnosis not present

## 2017-10-19 DIAGNOSIS — I5023 Acute on chronic systolic (congestive) heart failure: Secondary | ICD-10-CM | POA: Diagnosis not present

## 2017-10-19 DIAGNOSIS — I110 Hypertensive heart disease with heart failure: Secondary | ICD-10-CM | POA: Diagnosis not present

## 2017-10-19 DIAGNOSIS — I2510 Atherosclerotic heart disease of native coronary artery without angina pectoris: Secondary | ICD-10-CM | POA: Diagnosis not present

## 2017-10-19 DIAGNOSIS — L97311 Non-pressure chronic ulcer of right ankle limited to breakdown of skin: Secondary | ICD-10-CM | POA: Diagnosis not present

## 2017-10-19 DIAGNOSIS — Z48817 Encounter for surgical aftercare following surgery on the skin and subcutaneous tissue: Secondary | ICD-10-CM | POA: Diagnosis not present

## 2017-10-20 ENCOUNTER — Other Ambulatory Visit (INDEPENDENT_AMBULATORY_CARE_PROVIDER_SITE_OTHER): Payer: Self-pay | Admitting: Nurse Practitioner

## 2017-10-20 ENCOUNTER — Encounter (INDEPENDENT_AMBULATORY_CARE_PROVIDER_SITE_OTHER): Payer: Self-pay | Admitting: Vascular Surgery

## 2017-10-20 ENCOUNTER — Encounter

## 2017-10-20 ENCOUNTER — Encounter (INDEPENDENT_AMBULATORY_CARE_PROVIDER_SITE_OTHER): Payer: Self-pay

## 2017-10-20 ENCOUNTER — Ambulatory Visit (INDEPENDENT_AMBULATORY_CARE_PROVIDER_SITE_OTHER): Payer: Medicare Other

## 2017-10-20 ENCOUNTER — Ambulatory Visit (INDEPENDENT_AMBULATORY_CARE_PROVIDER_SITE_OTHER): Payer: Medicare Other | Admitting: Nurse Practitioner

## 2017-10-20 VITALS — BP 102/60 | HR 80 | Resp 17 | Ht 66.0 in | Wt 166.0 lb

## 2017-10-20 DIAGNOSIS — E785 Hyperlipidemia, unspecified: Secondary | ICD-10-CM

## 2017-10-20 DIAGNOSIS — I7025 Atherosclerosis of native arteries of other extremities with ulceration: Secondary | ICD-10-CM

## 2017-10-20 DIAGNOSIS — I739 Peripheral vascular disease, unspecified: Secondary | ICD-10-CM

## 2017-10-20 DIAGNOSIS — I890 Lymphedema, not elsewhere classified: Secondary | ICD-10-CM | POA: Diagnosis not present

## 2017-10-20 DIAGNOSIS — M1990 Unspecified osteoarthritis, unspecified site: Secondary | ICD-10-CM | POA: Diagnosis not present

## 2017-10-20 NOTE — Progress Notes (Signed)
Subjective:    Patient ID: [name], male    DOB: 12-12-1940, 77 y.o.   MRN: [id number] Chief Complaint  Patient presents with  . Follow-up    ABI follow up    HPI  [name] is a 77 y.o. male That returns to the office for followup and review status post angiogram with intervention. The patient notes improvement in the right lower extremity symptoms. No interval shortening of the patient's claudication distance or rest pain symptoms. Previous wounds continue to be slow to heal, however making progress.  No new ulcers or wounds have occurred since the last visit.  Patient complains more of numbness and tingling on his left lower extremity.  He states this is the same feeling a sensation he had prior to his right lower extremity angiogram.  He would like to proceed with an angiogram on the left lower extremity.  There have been no significant changes to the patient's overall health care.  The patient denies amaurosis fugax or recent TIA symptoms. There are no recent neurological changes noted. The patient denies history of DVT, PE or superficial thrombophlebitis. The patient denies recent episodes of angina or shortness of breath.   ABI's Rt=[place] and Lt=[place], with TBI of R=0.84 and L=0.82 (previous ABI's Rt=[place] and Lt=, with TBI of R=0.74 and L=0.78 ) .  The waveforms on the right lower extremity are stronger than those on the left.   Past Medical History:  Diagnosis Date  . AICD (automatic cardioverter/defibrillator) present    [employer]  . Arthritis    RA  . Balance problem   . CHF (congestive heart failure) ([place])   . Collagen vascular disease ([place])    RA since 2014  . Coronary artery disease   . Deviated nasal septum   . Dysrhythmia   . Hearing loss    TINNITUS  . High cholesterol   . Hypothyroidism   . Leg weakness   . Myocardial infarction ([place])   . Presence of permanent cardiac pacemaker    WITH DEFIBRILATOR ([employer])  . Seasonal  allergies    RHINITIS  . Shortness of breath dyspnea    COUGH  . Sinus headache   . Sinusitis    CHRONIC  . Sleep apnea   . Vertigo    DYSEQUILIBRIUM    Social History   Socioeconomic History  . Marital status: Married    Spouse name: [name]  . Number of children: 3  . Years of education: 29  . Highest education level: Not on file  Occupational History    Comment: Retired  Scientific laboratory technician  . Financial resource strain: Not on file  . Food insecurity:    Worry: Not on file    Inability: Not on file  . Transportation needs:    Medical: Not on file    Non-medical: Not on file  Tobacco Use  . Smoking status: Never Smoker  . Smokeless tobacco: Never Used  Substance and Sexual Activity  . Alcohol use: No    Alcohol/week: 0.0 standard drinks  . Drug use: No  . Sexual activity: Not on file  Lifestyle  . Physical activity:    Days per week: Not on file    Minutes per session: Not on file  . Stress: Not on file  Relationships  . Social connections:    Talks on phone: Not on file    Gets together: Not on file    Attends religious service: Not on file  Active member of club or organization: Not on file    Attends meetings of clubs or organizations: Not on file    Relationship status: Not on file  . Intimate partner violence:    Fear of current or ex partner: Not on file    Emotionally abused: Not on file    Physically abused: Not on file    Forced sexual activity: Not on file  Other Topics Concern  . Not on file  Social History Narrative   Patient lives at home with his wife [name]).   Retired.   Education 12th grade.   Right handed.   Caffeine one cup daily.    Past Surgical History:  Procedure Laterality Date  . CARDIAC ELECTROPHYSIOLOGY STUDY AND ABLATION    . CARDIAC SURGERY    . CATARACT EXTRACTION     BILATERAL  . CORONARY ANGIOPLASTY     STENTS  . CORONARY ARTERY BYPASS GRAFT  1992  . heart pump    . INSERT / REPLACE / REMOVE PACEMAKER    . LOWER  EXTREMITY ANGIOGRAPHY Right 07/06/2017   Procedure: LOWER EXTREMITY ANGIOGRAPHY;  Surgeon: Algernon Huxley, MD;  Location: [facility];  Service: Cardiovascular;  Laterality: Right;  . SEPTOPLASTY      Family History  Problem Relation Age of Onset  . Alzheimer's disease Mother     Allergies  Allergen Reactions  . Bactrim [Sulfamethoxazole-Trimethoprim] Other (See Comments)    Severe dehydration  . Doxycycline Other (See Comments)    sunburn  . Pravastatin Other (See Comments)    Reaction:  Joint pain      Review of Systems   Review of Systems: Negative Unless Checked Constitutional: [] Weight loss  [] Fever  [] Chills Cardiac: [] Chest pain   []  Atrial Fibrillation  [] Palpitations   [] Shortness of breath when laying flat   [] Shortness of breath with exertion. Vascular:  [x] Pain in legs with walking   [] Pain in legs with standing  [] History of DVT   [] Phlebitis   [x] Swelling in legs   [] Varicose veins   [x] Non-healing ulcers Pulmonary:   [] Uses home oxygen   [] Productive cough   [] Hemoptysis   [] Wheeze  [] COPD   [] Asthma Neurologic:  [] Dizziness   [] Seizures   [] History of stroke   [] History of TIA  [] Aphasia   [] Vissual changes   [] Weakness or numbness in arm   [x] Weakness or numbness in leg Musculoskeletal:   [] Joint swelling   [] Joint pain   [] Low back pain  []  History of Knee Replacement Hematologic:  [x] Easy bruising  [] Easy bleeding   [] Hypercoagulable state   [] Anemic Gastrointestinal:  [] Diarrhea   [] Vomiting  [] Gastroesophageal reflux/heartburn   [] Difficulty swallowing. Genitourinary:  [] Chronic kidney disease   [] Difficult urination  [] Anuric   [] Blood in urine Skin:  [] Rashes   [x] Ulcers  Psychological:  [] History of anxiety   []  History of major depression  []  Memory Difficulties     Objective:   Physical Exam  BP 102/60 (BP Location: Right Arm, Patient Position: Sitting)   Pulse 80   Resp 17   Ht 5\' 6"  (1.676 m)   Wt 166 lb (75.3 kg)   BMI 26.79 kg/m    Gen: WD/WN, NAD Head: [place]/AT, No temporalis wasting.  Ear/Nose/Throat: Hearing grossly intact, nares w/o erythema or drainage Eyes: PER, EOMI, sclera nonicteric.  Neck: Supple, no masses.  No JVD.  Pulmonary:  Good air movement, no use of accessory muscles.  Cardiac: RRR Vascular: 1+ non pitting edema on  lower extremities,  Vessel Right Left  Radial Palpable Palpable  Dorsalis Pedis Not Palpable Not Palpable  Posterior Tibial Palpable Not Palpable   Gastrointestinal: soft, non-distended. No guarding/no peritoneal signs.  Musculoskeletal: M/S 5/5 throughout.  No deformity or atrophy.  Neurologic: Pain and light touch intact in extremities.  Symmetrical.  Speech is fluent. Motor exam as listed above. Psychiatric: Judgment intact, Mood & affect appropriate for pt's clinical situation. Dermatologic:several superficial wounds on the calf, ankle and foot or RLE. [name]  No changes consistent with cellulitis. Lymph : No Cervical lymphadenopathy, no lichenification or skin changes of chronic lymphedema.      Assessment & Plan:   1. PAD (peripheral artery disease) (HCC) Recommend:  The patient has experienced increased symptoms and is now describing lifestyle limiting claudication and mild rest pain.   Given the severity of the patient's lower extremity symptoms the patient should undergo angiography and intervention.  Risk and benefits were reviewed the patient.  Indications for the procedure were reviewed.  All questions were answered, the patient agrees to proceed.   The patient should continue walking and begin a more formal exercise program.  The patient should continue antiplatelet therapy and aggressive treatment of the lipid abnormalities  The patient will follow up with me after the angiogram.   2. Lymphedema Swelling doing well today.  Right greater than left.  Patient endorses wearing medical grade 1 compression stockings on a daily basis.  States it control swelling well.  3.  Hyperlipidemia, unspecified hyperlipidemia type Continue statin as ordered and reviewed, no changes at this time   4. Arthritis Continue NSAID medications as already ordered, these medications have been reviewed and there are no changes at this time.  Continued activity and therapy was stressed.    Current Outpatient Medications on File Prior to Visit  Medication Sig Dispense Refill  . acetaminophen (TYLENOL 8 HOUR ARTHRITIS PAIN) 650 MG CR tablet Take 650-1,300 mg by mouth every 8 (eight) hours as needed (pain/headaches.).    [name] amiodarone (PACERONE) 200 MG tablet Take 200 mg by mouth at bedtime.     [name] aspirin EC 81 MG tablet Take 81 mg by mouth daily.    . cetirizine (ZYRTEC) 10 MG tablet Take 10 mg by mouth daily.    . Cholecalciferol (VITAMIN D3) 2000 UNITS TABS Take 2,000 Units by mouth daily.     . clopidogrel (PLAVIX) 75 MG tablet Take 1 tablet (75 mg total) by mouth daily. 30 tablet 11  . docusate sodium (COLACE) 100 MG capsule Take 100-200 mg by mouth daily as needed (for constipation.).     [name] ENTRESTO 24-26 MG Take 1 tablet by mouth every 12 (twelve) hours.  11  . furosemide (LASIX) 40 MG tablet Take 20-40 mg by mouth daily as needed for edema (for fluid retention.).     [name] leflunomide (ARAVA) 10 MG tablet Take 10 mg by mouth daily.  2  . levothyroxine (SYNTHROID, LEVOTHROID) 50 MCG tablet Take 50 mcg by mouth daily before breakfast.     . mexiletine (MEXITIL) 150 MG capsule Take 150 mg by mouth every 8 (eight) hours.     . nitroGLYCERIN (NITROSTAT) 0.4 MG SL tablet Place 0.4 mg under the tongue every 5 (five) minutes as needed for chest pain.    . Polyethylene Glycol 3350 (PEG 3350) POWD Take 17 g by mouth daily as needed (for constipation.).     [name] potassium chloride (K-DUR) 10 MEQ tablet Take 10 mEq by mouth daily.     [name]  SANTYL ointment Apply 1 application topically 3 (three) times a week. Apply a "nickel thick" amount to wound as directed  1  . traMADol (ULTRAM) 50 MG tablet  Take 1 tablet by mouth 3 (three) times daily as needed (for pan.).     [name] vitamin B-12 (CYANOCOBALAMIN) 1000 MCG tablet Take 1,000 mcg by mouth daily.    . metoprolol succinate (TOPROL-XL) 25 MG 24 hr tablet Take 25 mg by mouth 2 (two) times daily.      No current facility-administered medications on file prior to visit.     There are no Patient Instructions on file for this visit. No follow-ups on file.   Kris Hartmann, NP

## 2017-10-21 DIAGNOSIS — L97311 Non-pressure chronic ulcer of right ankle limited to breakdown of skin: Secondary | ICD-10-CM | POA: Diagnosis not present

## 2017-10-21 DIAGNOSIS — Z48817 Encounter for surgical aftercare following surgery on the skin and subcutaneous tissue: Secondary | ICD-10-CM | POA: Diagnosis not present

## 2017-10-21 DIAGNOSIS — I110 Hypertensive heart disease with heart failure: Secondary | ICD-10-CM | POA: Diagnosis not present

## 2017-10-21 DIAGNOSIS — I2510 Atherosclerotic heart disease of native coronary artery without angina pectoris: Secondary | ICD-10-CM | POA: Diagnosis not present

## 2017-10-21 DIAGNOSIS — L97511 Non-pressure chronic ulcer of other part of right foot limited to breakdown of skin: Secondary | ICD-10-CM | POA: Diagnosis not present

## 2017-10-21 DIAGNOSIS — I5023 Acute on chronic systolic (congestive) heart failure: Secondary | ICD-10-CM | POA: Diagnosis not present

## 2017-10-23 DIAGNOSIS — Z48817 Encounter for surgical aftercare following surgery on the skin and subcutaneous tissue: Secondary | ICD-10-CM | POA: Diagnosis not present

## 2017-10-23 DIAGNOSIS — L97311 Non-pressure chronic ulcer of right ankle limited to breakdown of skin: Secondary | ICD-10-CM | POA: Diagnosis not present

## 2017-10-23 DIAGNOSIS — I2510 Atherosclerotic heart disease of native coronary artery without angina pectoris: Secondary | ICD-10-CM | POA: Diagnosis not present

## 2017-10-23 DIAGNOSIS — I110 Hypertensive heart disease with heart failure: Secondary | ICD-10-CM | POA: Diagnosis not present

## 2017-10-23 DIAGNOSIS — L97511 Non-pressure chronic ulcer of other part of right foot limited to breakdown of skin: Secondary | ICD-10-CM | POA: Diagnosis not present

## 2017-10-23 DIAGNOSIS — I5023 Acute on chronic systolic (congestive) heart failure: Secondary | ICD-10-CM | POA: Diagnosis not present

## 2017-10-26 DIAGNOSIS — I472 Ventricular tachycardia: Secondary | ICD-10-CM | POA: Diagnosis not present

## 2017-10-26 DIAGNOSIS — L97311 Non-pressure chronic ulcer of right ankle limited to breakdown of skin: Secondary | ICD-10-CM | POA: Diagnosis not present

## 2017-10-26 DIAGNOSIS — I110 Hypertensive heart disease with heart failure: Secondary | ICD-10-CM | POA: Diagnosis not present

## 2017-10-26 DIAGNOSIS — Z48817 Encounter for surgical aftercare following surgery on the skin and subcutaneous tissue: Secondary | ICD-10-CM | POA: Diagnosis not present

## 2017-10-26 DIAGNOSIS — L97511 Non-pressure chronic ulcer of other part of right foot limited to breakdown of skin: Secondary | ICD-10-CM | POA: Diagnosis not present

## 2017-10-26 DIAGNOSIS — I739 Peripheral vascular disease, unspecified: Secondary | ICD-10-CM | POA: Diagnosis not present

## 2017-10-26 DIAGNOSIS — I10 Essential (primary) hypertension: Secondary | ICD-10-CM | POA: Diagnosis not present

## 2017-10-26 DIAGNOSIS — I5022 Chronic systolic (congestive) heart failure: Secondary | ICD-10-CM | POA: Diagnosis not present

## 2017-10-26 DIAGNOSIS — I2510 Atherosclerotic heart disease of native coronary artery without angina pectoris: Secondary | ICD-10-CM | POA: Diagnosis not present

## 2017-10-26 DIAGNOSIS — I5023 Acute on chronic systolic (congestive) heart failure: Secondary | ICD-10-CM | POA: Diagnosis not present

## 2017-10-28 ENCOUNTER — Encounter: Payer: Medicare Other | Admitting: Internal Medicine

## 2017-10-28 DIAGNOSIS — L97521 Non-pressure chronic ulcer of other part of left foot limited to breakdown of skin: Secondary | ICD-10-CM | POA: Diagnosis not present

## 2017-10-28 DIAGNOSIS — I70233 Atherosclerosis of native arteries of right leg with ulceration of ankle: Secondary | ICD-10-CM | POA: Diagnosis not present

## 2017-10-28 DIAGNOSIS — I252 Old myocardial infarction: Secondary | ICD-10-CM | POA: Diagnosis not present

## 2017-10-28 DIAGNOSIS — I70235 Atherosclerosis of native arteries of right leg with ulceration of other part of foot: Secondary | ICD-10-CM | POA: Diagnosis not present

## 2017-10-28 DIAGNOSIS — L97311 Non-pressure chronic ulcer of right ankle limited to breakdown of skin: Secondary | ICD-10-CM | POA: Diagnosis not present

## 2017-10-28 DIAGNOSIS — E1151 Type 2 diabetes mellitus with diabetic peripheral angiopathy without gangrene: Secondary | ICD-10-CM | POA: Diagnosis not present

## 2017-10-28 DIAGNOSIS — L97512 Non-pressure chronic ulcer of other part of right foot with fat layer exposed: Secondary | ICD-10-CM | POA: Diagnosis not present

## 2017-10-28 DIAGNOSIS — L97312 Non-pressure chronic ulcer of right ankle with fat layer exposed: Secondary | ICD-10-CM | POA: Diagnosis not present

## 2017-10-28 DIAGNOSIS — E11621 Type 2 diabetes mellitus with foot ulcer: Secondary | ICD-10-CM | POA: Diagnosis not present

## 2017-10-29 DIAGNOSIS — I5023 Acute on chronic systolic (congestive) heart failure: Secondary | ICD-10-CM | POA: Diagnosis not present

## 2017-10-29 DIAGNOSIS — L97511 Non-pressure chronic ulcer of other part of right foot limited to breakdown of skin: Secondary | ICD-10-CM | POA: Diagnosis not present

## 2017-10-29 DIAGNOSIS — Z48817 Encounter for surgical aftercare following surgery on the skin and subcutaneous tissue: Secondary | ICD-10-CM | POA: Diagnosis not present

## 2017-10-29 DIAGNOSIS — I110 Hypertensive heart disease with heart failure: Secondary | ICD-10-CM | POA: Diagnosis not present

## 2017-10-29 DIAGNOSIS — I2510 Atherosclerotic heart disease of native coronary artery without angina pectoris: Secondary | ICD-10-CM | POA: Diagnosis not present

## 2017-10-29 DIAGNOSIS — L97311 Non-pressure chronic ulcer of right ankle limited to breakdown of skin: Secondary | ICD-10-CM | POA: Diagnosis not present

## 2017-10-30 ENCOUNTER — Encounter
Admission: RE | Admit: 2017-10-30 | Discharge: 2017-10-30 | Disposition: A | Discharge status: Home / Self Care | Payer: Medicare Other | Source: Ambulatory Visit | Attending: Vascular Surgery | Admitting: Vascular Surgery

## 2017-10-30 DIAGNOSIS — Z01812 Encounter for preprocedural laboratory examination: Secondary | ICD-10-CM | POA: Insufficient documentation

## 2017-10-30 DIAGNOSIS — I110 Hypertensive heart disease with heart failure: Secondary | ICD-10-CM | POA: Diagnosis not present

## 2017-10-30 DIAGNOSIS — I2510 Atherosclerotic heart disease of native coronary artery without angina pectoris: Secondary | ICD-10-CM | POA: Diagnosis not present

## 2017-10-30 DIAGNOSIS — I5023 Acute on chronic systolic (congestive) heart failure: Secondary | ICD-10-CM | POA: Diagnosis not present

## 2017-10-30 DIAGNOSIS — L97311 Non-pressure chronic ulcer of right ankle limited to breakdown of skin: Secondary | ICD-10-CM | POA: Diagnosis not present

## 2017-10-30 DIAGNOSIS — Z48817 Encounter for surgical aftercare following surgery on the skin and subcutaneous tissue: Secondary | ICD-10-CM | POA: Diagnosis not present

## 2017-10-30 DIAGNOSIS — L97511 Non-pressure chronic ulcer of other part of right foot limited to breakdown of skin: Secondary | ICD-10-CM | POA: Diagnosis not present

## 2017-10-30 HISTORY — DX: Adverse effect of unspecified anesthetic, initial encounter: T41.45XA

## 2017-10-30 HISTORY — DX: Peripheral vascular disease, unspecified: I73.9

## 2017-10-30 HISTORY — DX: Other complications of anesthesia, initial encounter: T88.59XA

## 2017-10-30 LAB — CREATININE, SERUM
Creatinine, Ser: 1.11 mg/dL (ref 0.61–1.24)
GFR calc non Af Amer: >60 mL/min (ref >60)

## 2017-10-30 LAB — BUN: BUN: 20 mg/dL (ref 8–23)

## 2017-10-30 NOTE — Progress Notes (Signed)
DALEN, HENNESSEE ([id number]) Visit Report for 10/28/2017 Debridement Details Patient Name: RAGE, BEEVER Date of Service: 10/28/2017 8:30 AM Medical Record Number: [id number] Patient Account Number: [id number] Date of Birth/Sex: [birth year] (76 y.o. M) Treating RN: Cornell Barman Primary Care Provider: Cyndi Bender Other Clinician: Referring Provider: Cyndi Bender Treating Provider/Extender: Tito Dine in Treatment: 29 Debridement Performed for Wound #6 Right,Lateral Malleolus Assessment: Performed By: Physician Ricard Dillon, MD Debridement Type: Debridement Severity of Tissue Pre Fat layer exposed Debridement: Level of Consciousness (Pre- Awake and Alert procedure): Pre-procedure Verification/Time Yes - 09:05 Out Taken: Start Time: 09:05 Pain Control: Lidocaine Total Area Debrided (L x W): 0.3 (cm) x 0.3 (cm) = 0.09 (cm) Tissue and other material Non-Viable, Slough, Subcutaneous, Slough debrided: Level: Skin/Subcutaneous Tissue Debridement Description: Excisional Instrument: Curette Bleeding: Minimum Hemostasis Achieved: Pressure Response to Treatment: Procedure was tolerated well Level of Consciousness Awake and Alert (Post-procedure): Post Debridement Measurements of Total Wound Length: (cm) 0.3 Width: (cm) 0.3 Depth: (cm) 0.3 Volume: (cm) 0.021 Character of Wound/Ulcer Post Debridement: Stable Severity of Tissue Post Debridement: Limited to breakdown of skin Post Procedure Diagnosis Same as Pre-procedure Electronic Signature(s) Signed: 10/28/2017 4:09:45 PM By: Linton Ham MD Signed: 10/28/2017 4:45:51 PM By: Gretta Cool, BSN, RN, CWS, Kim RN, BSN Entered By: Linton Ham on 10/28/2017 09:36:05 [place], [name] ([id number]) -------------------------------------------------------------------------------- Debridement Details Patient Name: [name] Date of Service: 10/28/2017 8:30 AM Medical Record Number:  [id number] Patient Account Number: [id number] Date of Birth/Sex: Jun 01, 1940 (76 y.o. M) Treating RN: Cornell Barman Primary Care Provider: Cyndi Bender Other Clinician: Referring Provider: Cyndi Bender Treating Provider/Extender: Tito Dine in Treatment: 29 Debridement Performed for Wound #8 Right,Lateral Foot Assessment: Performed By: Physician Ricard Dillon, MD Debridement Type: Debridement Severity of Tissue Pre Fat layer exposed Debridement: Level of Consciousness (Pre- Awake and Alert procedure): Pre-procedure Verification/Time Yes - 09:05 Out Taken: Start Time: 09:05 Pain Control: Lidocaine Total Area Debrided (L x W): 0.4 (cm) x 0.4 (cm) = 0.16 (cm) Tissue and other material Non-Viable, Slough, Subcutaneous, Slough debrided: Level: Skin/Subcutaneous Tissue Debridement Description: Excisional Instrument: Curette Bleeding: Minimum Hemostasis Achieved: Pressure Response to Treatment: Procedure was tolerated well Level of Consciousness Awake and Alert (Post-procedure): Post Debridement Measurements of Total Wound Length: (cm) 0.4 Width: (cm) 0.4 Depth: (cm) 0.3 Volume: (cm) 0.038 Character of Wound/Ulcer Post Debridement: Stable Severity of Tissue Post Debridement: Limited to breakdown of skin Post Procedure Diagnosis Same as Pre-procedure Electronic Signature(s) Signed: 10/28/2017 4:09:45 PM By: Linton Ham MD Signed: 10/28/2017 4:45:51 PM By: Gretta Cool, BSN, RN, CWS, Kim RN, BSN Entered By: Linton Ham on 10/28/2017 09:36:26 [place], [name] ([id number]) -------------------------------------------------------------------------------- HPI Details Patient Name: [name] Date of Service: 10/28/2017 8:30 AM Medical Record Number: [id number] Patient Account Number: [id number] Date of Birth/Sex: 1940/09/07 (76 y.o. M) Treating RN: Cornell Barman Primary Care Provider: Cyndi Bender Other Clinician: Referring Provider: Cyndi Bender Treating Provider/Extender: Tito Dine in Treatment: 29 History of Present Illness HPI Description: 04/08/17; this is a complex 77 year old man referred here from [facility]. He had been referred there for bilateral lower extremity edema with ulcer formation predominantly on the right calf but also the right foot. He had been receiving Unna boots bilaterally. The history here is long. He is not a diabetic however ICU looking through late 2018 he was worked up for chronic headaches, elevated inflammatory markers including C-reactive protein and ESR. He went on to actually have a left temporal artery biopsy  wasn't that was negative.he received about 6 weeks of high-dose prednisone 60 mg with improvement in his inflammatory markers. He was admitted to hospital in late November with ventricular tachycardia syncope. He has known ischemic cardiomyopathy. He was admitted in the hospital in mid December. Apparently this was precipitated by a syncopal spell falling out of his scooter while at [place]. There was ventricular arrhythmia. He has an implantable defibrillator and echocardiogram showed severe LV dysfunction with an EF of 20% and valvular regurgitations including mild AR, moderate MR. There was no stenosis. He ruled in for a non-ST elevation MI in the setting of V. tach.his wife states that sometime during this hospitalization she noted multiple areas of skin change on the right lower calf which became evident just after he left the hospital. He was back in hospital in February with acute renal failure hyponatremia. This responded to fluid resuscitation.. Interestingly I can't see much description of his right leg at that point in time.he was followed by Dr. Nehemiah Massed of dermatology for the necrotic wounds on his right leg. Apparently a biopsy was planned at one point but not done although in some notes that suggests it was. I cannot see these results area He was  noted to have a lot of edema. Was treated with bilateral Unna boots edges really helped with the swelling they have been using Bactroban to small open areas predominantly on the right anterior lower leg His history is complicated by the fact that he has rheumatoid arthritis followed by rheumatology. He is followed by neurology for disabling headaches. At one point this was felt to be giant cell arteritis although a left temporal biopsy was apparently negative. He was given a prolonged course of prednisone at 60 mg which managed his sedimentation rates but apparently did not prove improve the headaches. This is been tapered to off on by rheumatology on 03/13/17 Vascular had plans to do a venous reflux workup as well as arterial studies in May. They also wanted to get him a lymphedema pump. As mentioned he's been using bacitracin under Unna boot wraps to both lower legs 04/15/17; the patient arrives with most of his wounds improved. These are small punched out wounds. Most of them remaining ones are on the right anterior calf with the most problematic over the right lateral malleolus. There are no new areas. The symptom complex or potential symptom complex we are dealing with his chronic disabling headaches with inflammatory markers not responsive to prednisone and with a negative temporal biopsy, lower extremity weakness, skin ulcerations just on the right leg. We have managed to get his arterial studies moved up to April 30. He has a rheumatology consult at [facility] in June. He has seen dermatology locally, rheumatology locally, neurology locally. 04/22/17; small punched out areas on the right leg anteriorly posteriorly. Most of these appear to have closed over. Some of them have eschar over the surface. The most problematic area appears to be over the right lateral malleolus. We've been using prisma to all of this. He has arterial studies on April 30 and a rheumatology consult at [facility] on June 28 04/29/17;  most of the small punched out areas on the right leg posteriorly are closed. He has 2 or 3 openings anteriorly but most of these appear to be on the way to closing. Still problematically over the right lateral malleolus and right lateral foot with almost ischemic-looking eschar. His arterial studies that I ordered are due to be done next week on the 30th  so we should have them available for our next visit hopefully. He also saw a rheumatologist at [facility] and according to the patient he did 8 vials of blood. Finally he has scaling rash on his left foot and what looks to be at [facility] area on the left anterior leg 05/06/17; most of the small punched out areas on the right leg posteriorly and anteriorly are closed. He still has one small one anteriorly one over the right lateral malleolus and one over the right lateral foot. LATERRANCE, NAUTA ([id number]) He had his arterial studies they didn't seem to do waveform analysis not exactly sure why however in any case is ABIs were noncompressible bilaterally. They did provide TBIs although looking at the pressures it appears that his TBIs are quite normal. I therefore went ahead and debrided the area over the right lateral malleolus and the right lateral foot 05/13/17; most of the small punched out areas on the right leg posteriorly and anteriorly are closed. He continues to have problematic areas over the right lateral malleolus and the right lateral foot. He still requiring aggressive debridement of these 2 wounds using silver collagen I reviewed the note from rheumatology I don't think they came up with a specific diagnosis although he is known to have seropositive RA. They did a panel of lab work when I was able to see his his AMA was negative, anti-smooth muscle antibodies negative antineutrophil cytoplasmic antibodies negative,Liv/kia type 1 negative. Serum C3 and C4 were negative. I don't see his muscle enzymes specifically. He was referred back to his  local rheumatologist for management of his known rheumatoid arthritis.the patient states he still feels weak and fatigued. He states he has numbness in both feet and apparently is known to have neuropathy 05/20/17; the patient continues to have a difficult problem on the right lateral malleolus and the right lateral foot. Both of these wounds have no viable surface even with attempts at debridement. He has a new wound on the left plantar metatarsal head which looks more like a superficial diabetic pressure related injury then part of this underlying issue he has. Most of the rest of the wounds on his legs look satisfactory. Mostly on the right calf.reviewed his arterial studies which showed noncompressible vessels bilaterally but the TBIs were quite normal. 05/27/17; no real improvement in the right lateral malleolus and right lateral foot. In fact the right lateral foot is now on bone. I gave him doxycycline empirically last week it appears that he developed photosensitivity was 51 the son in a tractor. I'll not give him any more of this. This is predominantly on his face and dorsal forearms and hands. I given this more as an anti- inflammatory. I'm going to have him seen by vascular surgery. His TBIs that he had done previously ordered by Dr. Brigitte Pulse were in the normal range They never did full arterial studies on him. he now has small punched out wounds on the right lateral ankle and right lateral foot. I doubt these are ischemic however I would like a review of his macrovascular status. He does describe some pain at night. I'll reduce the compression from 3 layer to 2 layer and I'm not convinced that this is a macrovascular issue however I want to make sure. We've been using silver alginate 06/03/17; the patient's x-rays that I ordered last time of his right ankle and right lateral foot did not show definite osteomyelitis. We've been using silver alginate. The wounds are not making any progress.  The  area on the plantar left first metatarsal head however appears to be better. The patient complains of weakness that is more of the fatigue. He says if he's walking his head will fall onto his chest and that his legs literally gave out on him. He did see neurology in the past however that was at a time where his workup was for temporal arteritis and headaches. 06/10/17; the patient is making no progress with the areas on the right lateral foot and right lateral malleolus. In fact the area on the foot probes to bone. X-ray did not show osteomyelitis. He went and saw vein and vascular on 06/04/17 he was felt to have significant reflux in the left greater saphenous vein over this is not in the area we are most concerned about. He could be offered ablation. He was not felt to have venous reflux noted in the right lower extremity. He was felt to have some degree of lymphedema and he was felt to be a candidate for compression pumps. Finally a diagnostic arteriogram was suggested which is really what I'm most interested in. The patient has wife wanted time to think about this, I think they were confused about interacting between venous and arterial discussions. I think it would be probably well worth going through the angiogram. Culture I did have the deeper area on the right lateral foot showed a few Enterococcus faecalis. I would like to start her on amoxicillin which they will start today for one-week 500 3 times a day 06/17/17; the patient still has punched out areas on the right lateral foot and right lateral malleolus. There is not a viable surface here. We have been using Santyl. He also has an area on the plantar aspect of his left first metatarsal head this also seems to be better 06/24/17; patient's angiogram as on 07/06/17; still has punched out areas on the right lateral foot and right lateral malleolus to which we've been applying Santyl-based dressings. He also has a more superficial area on the left  plantar first metatarsal head, using collagen here 07/08/17; the patient had his angiogram. This perineal artery had a 70-80% stenosis. Similarly the posterior tibial artery also was diseased. Furthermore down the posterior tibial artery in the distal segment was a short stenosis of 80%. His major vessels in his thigh and only minor irregularity. He had percutaneous angioplasty of the proximal right peroneal artery. Also angioplasty of the tibial peroneal trunk and proximal posterior tibial artery as well as the mid to distal segment of the posterior tibial artery. He handled this remarkably well. The patient arrived with a new wound on his right anterior calf which I think is the reopening of one of the original small open areas 07/15/17; the areas on his right anterior calf is new. He has a new threatened area on the right tibia more superiorly which is not open yet but makes me wonder whether this is going to reopen as well. His original wounds on the right lateral foot and right lateral malleolus are somewhat better in terms of surface Boshers, Kylen E. ([id number]) 07/22/17; the patient has a second open area on the right anterior. This was a threatened area superiorly last week. Each time this happens she has a small wound with a nephrotic cover. The areas on the right lateral foot and right lateral malleolus are about the same. I did not attempt debridement in any of these today continuing with Santyl. oThe area on the left second metatarsal head  looks better and he is healed laceration injuries on the arm from a fall last week with only the ventral forearm wound left 08/05/17 08/05/17; 2 week follow-up. This is a patient who initially presented with a multitude of small punched out areas on the right anterior calf greater than left dorsal foot. This was in the setting of a constitutional illness at which time he saw multiple specialists was worked up for various rheumatologic diseases  including temporal arteritis. Most of the areas on the right leg and a few on the left actually closed over however he he developed 2 small deep probing areas on the right lateral foot and right lateral malleolus. He also has an area on the left plantar first metatarsal head which is gradually been getting better. He was revascularized by Dr. dew about 4 weeks ago. He went to see Dr. Nehemiah Massed on 07/30/17; from review the note in time to the patient there wasn't an obvious cause. He did do a shave biopsy of the right anterior leg ulcer rule out cancer or other etiologies such as some embolic. This looks like some form of vasculopathy to me although trying to explain why it so much worse on the right leg than the left is been difficult. We will await the biopsy results. Dr. Nehemiah Massed wanted to put Bactroban and this not sure what that will do for the areas that have a completely nonviable surface. I'd like to go back to [place]. 08/19/17-He is here in follow up evaluation for multiple ulcerations to the right lower extremity and right foot and the left planter first metatarsal head ulcer. He has had biopsy to the right lateral proximal leg; no results available for review, patient and spouse report "negative" biopsy. We will continue with current treatment plan, using ace wrap compression to the right leg as his 15-45mHg compression stockings are providing suboptimal edema control. He will follow up in two weeks 09/02/17; the patient's biopsy done by dermatology/Dr. KNehemiah Massedwas apparently "negative". He has 2 open areas on the right calf one was the biopsy site on the right lateral calf. Most problematic wounds are on the right lateral malleolus and on the right lateral foot. Both of these have some depth we've been using Santyl here. Also now a small open area remaining on the left plantar foot 09/16/17 ; every week follow-up. The patient has been using Santyl to the area on the right lateral ankle and  right lateral foot. All the areas on the right calf including the biopsy site are closed. He still has the area on the first metatarsal head on the left. We've been using collagen to this as well oBiweekly visit. The area on the left first metatarsal head is just about closed. We have a new tape injury on the dorsal left foot that was caused by her nurse taking off his dressing. This is superficial. oHe still has difficult areas on the right lateral foot and right lateral malleolus. He been using Endoform. I was hoping we were going to see better results than today. Still required debridement. I'm going to put Oasis through his insurance 10/14/17; the patient arrives today with really not a lot of change in the small punched-out areas on the right lateral foot and right lateral malleolus. I have been using Endoform. He is also apparently okay for Oasis as he has a good secondary insurance to [employer] Nevertheless the wound bed is not ready for application 179/48/01 the patient arrives with not a lot of  change in the small punched out areas on the right lateral foot and right lateral malleolus. We've been using Endoform. Also this week he arrives with 2 blisters on the right lateral foot. He thinks this may have been secondary to a Band-Aid that pulled on the skin that was put over this area. He apparently also had unna paste on this area. Finally he has a fine macular rash on the left lower leg very pruritic. The cause of this is not clear he's been using hydrocortisone cream Electronic Signature(s) Signed: 10/28/2017 4:09:45 PM By: Linton Ham MD Entered By: Linton Ham on 10/28/2017 09:38:49 [place], [name] ([id number]) -------------------------------------------------------------------------------- Physical Exam Details Patient Name: [name] Date of Service: 10/28/2017 8:30 AM Medical Record Number: [id number] Patient Account Number: [id number] Date of Birth/Sex:  10-04-1940 (76 y.o. M) Treating RN: Cornell Barman Primary Care Provider: Cyndi Bender Other Clinician: Referring Provider: Cyndi Bender Treating Provider/Extender: Tito Dine in Treatment: 59 Constitutional Patient is hypotensive.He appears well. This is not unusual for him. Pulse regular and within target range for patient.[name] Respirations regular, non-labored and within target range.. Temperature is normal and within the target range for the patient.[name] appears in no distress. Notes Wound exam; right lateral malleolus and dorsal right foot absolutely no better. Filled with necrotic debris using #3 curet to remove this. Hemostasis with direct pressure. o2 blistered areas on the right lateral foot were evacuated. This is either from tape or perhaps an allergic reaction I'm not sure oHe has an erythematous macular rash on the left leg. This is apparently pruritic but none of his skin looks directly threatened Electronic Signature(s) Signed: 10/28/2017 4:09:45 PM By: Linton Ham MD Entered By: Linton Ham on 10/28/2017 09:44:10 [place], [name] ([id number]) -------------------------------------------------------------------------------- Physician Orders Details Patient Name: [name] Date of Service: 10/28/2017 8:30 AM Medical Record Number: [id number] Patient Account Number: [id number] Date of Birth/Sex: 22-Feb-1940 (76 y.o. M) Treating RN: Cornell Barman Primary Care Provider: Cyndi Bender Other Clinician: Referring Provider: Cyndi Bender Treating Provider/Extender: Tito Dine in Treatment: 13 Verbal / Phone Orders: No Diagnosis Coding Wound Cleansing Wound #18 Right,Lateral Foot o Clean wound with Normal Saline. Wound #6 Right,Lateral Malleolus o Clean wound with Normal Saline. Wound #8 Right,Lateral Foot o Clean wound with Normal Saline. Wound #9 Left Metatarsal head first o Clean wound with Normal Saline. Anesthetic (add to  Medication List) Wound #18 Right,Lateral Foot o Topical Lidocaine 4% cream applied to wound bed prior to debridement (In Clinic Only). Wound #6 Right,Lateral Malleolus o Topical Lidocaine 4% cream applied to wound bed prior to debridement (In Clinic Only). Wound #8 Right,Lateral Foot o Topical Lidocaine 4% cream applied to wound bed prior to debridement (In Clinic Only). Wound #9 Left Metatarsal head first o Topical Lidocaine 4% cream applied to wound bed prior to debridement (In Clinic Only). Primary Wound Dressing Wound #6 Right,Lateral Malleolus o Santyl Ointment Wound #8 Right,Lateral Foot o Santyl Ointment Wound #18 Right,Lateral Foot o Silver Alginate Wound #9 Left Metatarsal head first o Alginate Secondary Dressing Wound #6 Right,Lateral Malleolus o ABD pad o Conform/Kerlix [place], Claudia E. ([id number]) Wound #8 Right,Lateral Foot o ABD pad o Conform/Kerlix Wound #9 Left Metatarsal head first o ABD pad o Conform/Kerlix Dressing Change Frequency Wound #6 Right,Lateral Malleolus o Change Dressing Monday, Wednesday, Friday Wound #8 Right,Lateral Foot o Change Dressing Monday, Wednesday, Friday Wound #9 Left Metatarsal head first o Change Dressing Monday, Wednesday, Friday Follow-up Appointments Wound #6 Right,Lateral Malleolus o Return Appointment  in 2 weeks. Wound #8 Right,Lateral Foot o Return Appointment in 2 weeks. Wound #9 Left Metatarsal head first o Return Appointment in 2 weeks. Home Health Wound #6 [facility] Visits - Monday, Wednesday and Friday on weeks patient does not come to wound care center. o Home Health Nurse may visit PRN to address patientos wound care needs. o FACE TO FACE ENCOUNTER: MEDICARE and MEDICAID PATIENTS: I certify that this patient is under my care and that I had a face-to-face encounter that meets the physician face-to-face encounter requirements  with this patient on this date. The encounter with the patient was in whole or in part for the following MEDICAL CONDITION: (primary reason for [place]) MEDICAL NECESSITY: I certify, that based on my findings, NURSING services are a medically necessary home health service. HOME BOUND STATUS: I certify that my clinical findings support that this patient is homebound (i.e., Due to illness or injury, pt requires aid of supportive devices such as crutches, cane, wheelchairs, walkers, the use of special transportation or the assistance of another person to leave their place of residence. There is a normal inability to leave the home and doing so requires considerable and taxing effort. Other absences are for medical reasons / religious services and are infrequent or of short duration when for other reasons). o If current dressing causes regression in wound condition, may D/C ordered dressing product/s and apply Normal Saline Moist Dressing daily until next [place] / Other MD appointment. [place] of regression in wound condition at [phone]. o Please direct any NON-WOUND related issues/requests for orders to patient's Primary Care Physician Wound #8 [place] Visits - Monday, Wednesday and Friday on weeks patient does not come to wound care center. o Home Health Nurse may visit PRN to address patientos wound care needs. o FACE TO FACE ENCOUNTER: MEDICARE and MEDICAID PATIENTS: I certify that this patient is under my care and that I had a face-to-face encounter that meets the physician face-to-face encounter requirements with this patient on this date. The encounter with the patient was in whole or in part for the following [place], AMMAR MOFFATT ([id number]) CONDITION: (primary reason for Home Healthcare) MEDICAL NECESSITY: I certify, that based on my findings, NURSING services are a medically necessary home  health service. HOME BOUND STATUS: I certify that my clinical findings support that this patient is homebound (i.e., Due to illness or injury, pt requires aid of supportive devices such as crutches, cane, wheelchairs, walkers, the use of special transportation or the assistance of another person to leave their place of residence. There is a normal inability to leave the home and doing so requires considerable and taxing effort. Other absences are for medical reasons / religious services and are infrequent or of short duration when for other reasons). o If current dressing causes regression in wound condition, may D/C ordered dressing product/s and apply Normal Saline Moist Dressing daily until next [place] / Other MD appointment. [place] of regression in wound condition at [phone]. o Please direct any NON-WOUND related issues/requests for orders to patient's Primary Care Physician Wound #9 Left Metatarsal head first o [place] Visits - Monday, Wednesday and Friday on weeks patient does not come to wound care center. o Home Health Nurse may visit PRN to address patientos wound care needs. o FACE TO FACE ENCOUNTER: MEDICARE and MEDICAID PATIENTS: I certify that this patient is under  my care and that I had a face-to-face encounter that meets the physician face-to-face encounter requirements with this patient on this date. The encounter with the patient was in whole or in part for the following MEDICAL CONDITION: (primary reason for [place]) MEDICAL NECESSITY: I certify, that based on my findings, NURSING services are a medically necessary home health service. HOME BOUND STATUS: I certify that my clinical findings support that this patient is homebound (i.e., Due to illness or injury, pt requires aid of supportive devices such as crutches, cane, wheelchairs, walkers, the use of special transportation or the assistance of another  person to leave their place of residence. There is a normal inability to leave the home and doing so requires considerable and taxing effort. Other absences are for medical reasons / religious services and are infrequent or of short duration when for other reasons). o If current dressing causes regression in wound condition, may D/C ordered dressing product/s and apply Normal Saline Moist Dressing daily until next [place] / Other MD appointment. [place] of regression in wound condition at [phone]. o Please direct any NON-WOUND related issues/requests for orders to patient's Primary Care Physician Electronic Signature(s) Signed: 10/28/2017 4:09:45 PM By: Linton Ham MD Signed: 10/28/2017 4:45:51 PM By: Gretta Cool, BSN, RN, CWS, Kim RN, BSN Entered By: Gretta Cool, BSN, RN, CWS, Kim on 10/28/2017 09:16:52 [place], [name] ([id number]) -------------------------------------------------------------------------------- Problem List Details Patient Name: [name] Date of Service: 10/28/2017 8:30 AM Medical Record Number: [id number] Patient Account Number: [id number] Date of Birth/Sex: 12-21-1940 (76 y.o. 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 of 07/08/2017 No Yes other part of foot I25.5 Ischemic cardiomyopathy 04/08/2017 No Yes Inactive Problems ICD-10 Code Description Active Date Inactive Date L97.212 Non-pressure chronic ulcer of right  calf with fat layer exposed 04/08/2017 04/08/2017 Resolved Problems ICD-10 Code Description Active Date Resolved Date S41.111D Laceration without foreign body of right upper arm, subsequent 07/15/2017 07/15/2017 encounter Electronic Signature(s) Signed: 10/28/2017 4:09:45 PM By: Linton Ham MD Entered By: Linton Ham on 10/28/2017 09:35:16 [place], [name] ([id number]) -------------------------------------------------------------------------------- Progress Note Details Patient Name: [name] Date of Service: 10/28/2017 8:30 AM Medical Record Number: [id number] Patient Account Number: [id number] Date of Birth/Sex: [birth year] (76 y.o. M) Treating RN: Cornell Barman Primary Care Provider: Cyndi Bender Other Clinician: Referring Provider: Cyndi Bender Treating Provider/Extender: Tito Dine in Treatment: 29 Subjective History of Present Illness (HPI) 04/08/17; this is a complex 77 year old man referred here from [facility]. He had been referred there for bilateral lower extremity edema with ulcer formation predominantly on the right calf but also the right foot. He had been receiving Unna boots bilaterally. The history here is long. He is not a diabetic however ICU looking through late 2018 he was worked up for chronic headaches, elevated inflammatory markers including C-reactive protein and ESR. He went on to actually have a left temporal artery biopsy wasn't that was negative.he received about 6 weeks of high-dose prednisone 60 mg with improvement in his inflammatory markers. He was admitted to hospital  in late November with ventricular tachycardia syncope. He has known ischemic cardiomyopathy. He was admitted in the hospital in mid December. Apparently this was precipitated by a syncopal spell falling out of his scooter while at [place]. There was ventricular arrhythmia. He has an implantable defibrillator and echocardiogram showed severe LV  dysfunction with an EF of 20% and valvular regurgitations including mild AR, moderate MR. There was no stenosis. He ruled in for a non-ST elevation MI in the setting of V. tach.his wife states that sometime during this hospitalization she noted multiple areas of skin change on the right lower calf which became evident just after he left the hospital. He was back in hospital in February with acute renal failure hyponatremia. This responded to fluid resuscitation.. Interestingly I can't see much description of his right leg at that point in time.he was followed by Dr. Nehemiah Massed of dermatology for the necrotic wounds on his right leg. Apparently a biopsy was planned at one point but not done although in some notes that suggests it was. I cannot see these results area He was noted to have a lot of edema. Was treated with bilateral Unna boots edges really helped with the swelling they have been using Bactroban to small open areas predominantly on the right anterior lower leg His history is complicated by the fact that he has rheumatoid arthritis followed by rheumatology. He is followed by neurology for disabling headaches. At one point this was felt to be giant cell arteritis although a left temporal biopsy was apparently negative. He was given a prolonged course of prednisone at 60 mg which managed his sedimentation rates but apparently did not prove improve the headaches. This is been tapered to off on by rheumatology on 03/13/17 Vascular had plans to do a venous reflux workup as well as arterial studies in May. They also wanted to get him a lymphedema pump. As mentioned he's been using bacitracin under Unna boot wraps to both lower legs 04/15/17; the patient arrives with most of his wounds improved. These are small punched out wounds. Most of them remaining ones are on the right anterior calf with the most problematic over the right lateral malleolus. There are no new areas. The symptom complex or  potential symptom complex we are dealing with his chronic disabling headaches with inflammatory markers not responsive to prednisone and with a negative temporal biopsy, lower extremity weakness, skin ulcerations just on the right leg. We have managed to get his arterial studies moved up to April 30. He has a rheumatology consult at [facility] in June. He has seen dermatology locally, rheumatology locally, neurology locally. 04/22/17; small punched out areas on the right leg anteriorly posteriorly. Most of these appear to have closed over. Some of them have eschar over the surface. The most problematic area appears to be over the right lateral malleolus. We've been using prisma to all of this. He has arterial studies on April 30 and a rheumatology consult at [facility] on June 28 04/29/17; most of the small punched out areas on the right leg posteriorly are closed. He has 2 or 3 openings anteriorly but most of these appear to be on the way to closing. Still problematically over the right lateral malleolus and right lateral foot with almost ischemic-looking eschar. His arterial studies that I ordered are due to be done next week on the 30th so we should have them available for our next visit hopefully. He also saw a rheumatologist at [facility] and according to the patient he  did 8 vials of blood. Finally he has scaling rash on his left foot and what looks to be at [facility] area on the left anterior leg 05/06/17; most of the small punched out areas on the right leg posteriorly and anteriorly are closed. He still has one small one Berberian, Deni E. ([id number]) anteriorly one over the right lateral malleolus and one over the right lateral foot. He had his arterial studies they didn't seem to do waveform analysis not exactly sure why however in any case is ABIs were noncompressible bilaterally. They did provide TBIs although looking at the pressures it appears that his TBIs are quite normal. I therefore went ahead and  debrided the area over the right lateral malleolus and the right lateral foot 05/13/17; most of the small punched out areas on the right leg posteriorly and anteriorly are closed. He continues to have problematic areas over the right lateral malleolus and the right lateral foot. He still requiring aggressive debridement of these 2 wounds using silver collagen I reviewed the note from rheumatology I don't think they came up with a specific diagnosis although he is known to have seropositive RA. They did a panel of lab work when I was able to see his his AMA was negative, anti-smooth muscle antibodies negative antineutrophil cytoplasmic antibodies negative,Liv/kia type 1 negative. Serum C3 and C4 were negative. I don't see his muscle enzymes specifically. He was referred back to his local rheumatologist for management of his known rheumatoid arthritis.the patient states he still feels weak and fatigued. He states he has numbness in both feet and apparently is known to have neuropathy 05/20/17; the patient continues to have a difficult problem on the right lateral malleolus and the right lateral foot. Both of these wounds have no viable surface even with attempts at debridement. He has a new wound on the left plantar metatarsal head which looks more like a superficial diabetic pressure related injury then part of this underlying issue he has. Most of the rest of the wounds on his legs look satisfactory. Mostly on the right calf.reviewed his arterial studies which showed noncompressible vessels bilaterally but the TBIs were quite normal. 05/27/17; no real improvement in the right lateral malleolus and right lateral foot. In fact the right lateral foot is now on bone. 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 I'll not give him any more of this. This is predominantly on his face and dorsal forearms and hands. I given this more as an  anti- inflammatory. I'm going to have him seen by vascular surgery. His TBIs that he had done previously ordered by Dr. Brigitte Pulse were in the normal range They never did full arterial studies on him. he now has small punched out wounds on the right lateral ankle and right lateral foot. I doubt these are ischemic however I would like a review of his macrovascular status. He does describe some pain at night. I'll reduce the compression from 3 layer to 2 layer and I'm not convinced that this is a macrovascular issue however I want to make sure. We've been using silver alginate 06/03/17; the patient's x-rays that I ordered last time of his right ankle and right lateral foot did not show definite osteomyelitis. We've been using silver alginate. The wounds are not making any progress. The area on the plantar left first metatarsal head however appears to be better. The patient complains of weakness that is more of  the fatigue. He says if he's walking his head will fall onto his chest and that his legs literally gave out on him. He did see neurology in the past however that was at a time where his workup was for temporal arteritis and headaches. 06/10/17; the patient is making no progress with the areas on the right lateral foot and right lateral malleolus. In fact the area on the foot probes to bone. X-ray did not show osteomyelitis. He went and saw vein and vascular on 06/04/17 he was felt to have significant reflux in the left greater saphenous vein over this is not in the area we are most concerned about. He could be offered ablation. He was not felt to have venous reflux noted in the right lower extremity. He was felt to have some degree of lymphedema and he was felt to be a candidate for compression pumps. Finally a diagnostic arteriogram was suggested which is really what I'm most interested in. The patient has wife wanted time to think about this, I think they were confused about interacting between venous  and arterial discussions. I think it would be probably well worth going through the angiogram. Culture I did have the deeper area on the right lateral foot showed a few Enterococcus faecalis. I would like to start her on amoxicillin which they will start today for one-week 500 3 times a day 06/17/17; the patient still has punched out areas on the right lateral foot and right lateral malleolus. There is not a viable surface here. We have been using Santyl. He also has an area on the plantar aspect of his left first metatarsal head this also seems to be better 06/24/17; patient's angiogram as on 07/06/17; still has punched out areas on the right lateral foot and right lateral malleolus to which we've been applying Santyl-based dressings. He also has a more superficial area on the left plantar first metatarsal head, using collagen here 07/08/17; the patient had his angiogram. This perineal artery had a 70-80% stenosis. Similarly the posterior tibial artery also was diseased. Furthermore down the posterior tibial artery in the distal segment was a short stenosis of 80%. His major vessels in his thigh and only minor irregularity. He had percutaneous angioplasty of the proximal right peroneal artery. Also angioplasty of the tibial peroneal trunk and proximal posterior tibial artery as well as the mid to distal segment of the posterior tibial artery. He handled this remarkably well. The patient arrived with a new wound on his right anterior calf which I think is the reopening of one of the original small open areas 07/15/17; the areas on his right anterior calf is new. He has a new threatened area on the right tibia more superiorly which is not open yet but makes me wonder whether this is going to reopen as well. His original wounds on the right lateral foot and Massmann, Shuayb E. ([id number]) right lateral malleolus are somewhat better in terms of surface 07/22/17; the patient has a second open area on the  right anterior. This was a threatened area superiorly last week. Each time this happens she has a small wound with a nephrotic cover. The areas on the right lateral foot and right lateral malleolus are about the same. I did not attempt debridement in any of these today continuing with Santyl. The area on the left second metatarsal head looks better and he is healed laceration injuries on the arm from a fall last week with only the ventral forearm wound left  08/05/17 08/05/17; 2 week follow-up. This is a patient who initially presented with a multitude of small punched out areas on the right anterior calf greater than left dorsal foot. This was in the setting of a constitutional illness at which time he saw multiple specialists was worked up for various rheumatologic diseases including temporal arteritis. Most of the areas on the right leg and a few on the left actually closed over however he he developed 2 small deep probing areas on the right lateral foot and right lateral malleolus. He also has an area on the left plantar first metatarsal head which is gradually been getting better. He was revascularized by Dr. dew about 4 weeks ago. He went to see Dr. Nehemiah Massed on 07/30/17; from review the note in time to the patient there wasn't an obvious cause. He did do a shave biopsy of the right anterior leg ulcer rule out cancer or other etiologies such as some embolic. This looks like some form of vasculopathy to me although trying to explain why it so much worse on the right leg than the left is been difficult. We will await the biopsy results. Dr. Nehemiah Massed wanted to put Bactroban and this not sure what that will do for the areas that have a completely nonviable surface. I'd like to go back to [place]. 08/19/17-He is here in follow up evaluation for multiple ulcerations to the right lower extremity and right foot and the left planter first metatarsal head ulcer. He has had biopsy to the right lateral proximal  leg; no results available for review, patient and spouse report "negative" biopsy. We will continue with current treatment plan, using ace wrap compression to the right leg as his 15-28mHg compression stockings are providing suboptimal edema control. He will follow up in two weeks 09/02/17; the patient's biopsy done by dermatology/Dr. KNehemiah Massedwas apparently "negative". He has 2 open areas on the right calf one was the biopsy site on the right lateral calf. Most problematic wounds are on the right lateral malleolus and on the right lateral foot. Both of these have some depth we've been using Santyl here. Also now a small open area remaining on the left plantar foot 09/16/17 ; every week follow-up. The patient has been using Santyl to the area on the right lateral ankle and right lateral foot. All the areas on the right calf including the biopsy site are closed. He still has the area on the first metatarsal head on the left. We've been using collagen to this as well Biweekly visit. The area on the left first metatarsal head is just about closed. We have a new tape injury on the dorsal left foot that was caused by her nurse taking off his dressing. This is superficial. He still has difficult areas on the right lateral foot and right lateral malleolus. He been using Endoform. I was hoping we were going to see better results than today. Still required debridement. I'm going to put Oasis through his insurance 10/14/17; the patient arrives today with really not a lot of change in the small punched-out areas on the right lateral foot and right lateral malleolus. I have been using Endoform. He is also apparently okay for Oasis as he has a good secondary insurance to [employer] Nevertheless the wound bed is not ready for application 116/10/96 the patient arrives with not a lot of change in the small punched out areas on the right lateral foot and right lateral malleolus. We've been using Endoform. Also this  week  he arrives with 2 blisters on the right lateral foot. He thinks this may have been secondary to a Band-Aid that pulled on the skin that was put over this area. He apparently also had unna paste on this area. Finally he has a fine macular rash on the left lower leg very pruritic. The cause of this is not clear he's been using hydrocortisone cream Objective Keys, MATIN MATTIOLI. ([id number]) Constitutional Patient is hypotensive.He appears well. This is not unusual for him. Pulse regular and within target range for patient.[name] Respirations regular, non-labored and within target range.. Temperature is normal and within the target range for the patient.[name] appears in no distress. Vitals Time Taken: 8:22 AM, Height: 68 in, Temperature: 97.7 F, Pulse: 80 bpm, Respiratory Rate: 16 breaths/min, Blood Pressure: 96/51 mmHg. General Notes: Wound exam; right lateral malleolus and dorsal right foot absolutely no better. Filled with necrotic debris using #3 curet to remove this. Hemostasis with direct pressure. 2 blistered areas on the right lateral foot were evacuated. This is either from tape or perhaps an allergic reaction I'm not sure He has an erythematous macular rash on the left leg. This is apparently pruritic but none of his skin looks directly threatened Integumentary (Hair, Skin) Wound #18 status is Open. Original cause of wound was Blister. The wound is located on the Right,Lateral Foot. The wound measures 4cm length x 1cm width x 0.1cm depth; 3.142cm^2 area and 0.314cm^3 volume. There is Fat Layer (Subcutaneous Tissue) Exposed exposed. There is a medium amount of serous drainage noted. The wound margin is distinct with the outline attached to the wound base. There is large (67-100%) granulation within the wound bed. There is no necrotic tissue within the wound bed. The periwound skin appearance exhibited: Excoriation. Wound #6 status is Open. Original cause of wound was Gradually Appeared. The  wound is located on the Right,Lateral Malleolus. The wound measures 0.3cm length x 0.3cm width x 0.2cm depth; 0.071cm^2 area and 0.014cm^3 volume. There is no tunneling or undermining noted. There is a medium amount of serous drainage noted. The wound margin is flat and intact. There is no granulation within the wound bed. There is a medium (34-66%) amount of necrotic tissue within the wound bed including Adherent Slough. The periwound skin appearance did not exhibit: Callus, Crepitus, Excoriation, Induration, Rash, Scarring, Dry/Scaly, Maceration, Atrophie Blanche, Cyanosis, Ecchymosis, Hemosiderin Staining, Mottled, Pallor, Rubor, Erythema. Wound #8 status is Open. Original cause of wound was Gradually Appeared. The wound is located on the Right,Lateral Foot. The wound measures 0.4cm length x 0.4cm width x 0.2cm depth; 0.126cm^2 area and 0.025cm^3 volume. There is no tunneling or undermining noted. There is a medium amount of serous drainage noted. The wound margin is flat and intact. There is no granulation within the wound bed. There is a large (67-100%) amount of necrotic tissue within the wound bed including Adherent Slough. The periwound skin appearance did not exhibit: Callus, Crepitus, Excoriation, Induration, Rash, Scarring, Dry/Scaly, Maceration, Atrophie Blanche, Cyanosis, Ecchymosis, Hemosiderin Staining, Mottled, Pallor, Rubor, Erythema. Wound #9 status is Open. Original cause of wound was Gradually Appeared. The wound is located on the Left Metatarsal head first. The wound measures 0.1cm length x 0.1cm width x 0.1cm depth; 0.008cm^2 area and 0.001cm^3 volume. There is no tunneling or undermining noted. There is a none present amount of drainage noted. The wound margin is flat and intact. There is no granulation within the wound bed. There is no necrotic tissue within the wound bed. The periwound skin appearance exhibited:  Callus. The periwound skin appearance did not exhibit:  Crepitus, Excoriation, Induration, Rash, Scarring, Dry/Scaly, Maceration, Atrophie Blanche, Cyanosis, Ecchymosis, Hemosiderin Staining, Mottled, Pallor, Rubor, Erythema. 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 ([id number]) Procedures Wound #6 Pre-procedure diagnosis of Wound #6 is an Arterial Insufficiency Ulcer located on the Right,Lateral Malleolus .Severity of Tissue Pre Debridement is: Fat layer exposed. There was a Excisional Skin/Subcutaneous Tissue Debridement with a total area of 0.09 sq cm performed by Ricard Dillon, MD. With the following instrument(s): Curette to remove Non-Viable tissue/material. Material removed includes Subcutaneous Tissue and Slough and after achieving pain control using Lidocaine. No specimens were taken. A time out was conducted at 09:05, prior to the start of the procedure. A Minimum amount of bleeding was controlled with Pressure. The procedure was tolerated well. Post Debridement Measurements: 0.3cm length x 0.3cm width x 0.3cm depth; 0.021cm^3 volume. Character of Wound/Ulcer Post Debridement is stable. Severity of Tissue Post Debridement is: Limited to breakdown of skin. Post procedure Diagnosis Wound #6: Same as Pre-Procedure Wound #8 Pre-procedure diagnosis of Wound #8 is an Arterial Insufficiency Ulcer located on the Right,Lateral Foot .Severity of Tissue Pre Debridement is: Fat layer exposed. There was a Excisional Skin/Subcutaneous Tissue Debridement with a total area of 0.16 sq cm performed by Ricard Dillon, MD. With the following instrument(s): Curette to remove Non-Viable tissue/material. Material removed includes Subcutaneous Tissue and Slough and after  achieving pain control using Lidocaine. No specimens were taken. A time out was conducted at 09:05, prior to the start of the procedure. A Minimum amount of bleeding was controlled with Pressure. The procedure was tolerated well. Post Debridement Measurements: 0.4cm length x 0.4cm width x 0.3cm depth; 0.038cm^3 volume. Character of Wound/Ulcer Post Debridement is stable. Severity of Tissue Post Debridement is: Limited to breakdown of skin. Post procedure Diagnosis Wound #8: Same as Pre-Procedure Plan Wound Cleansing: Wound #18 Right,Lateral Foot: Clean wound with Normal Saline. Wound #6 Right,Lateral Malleolus: Clean wound with Normal Saline. Wound #8 Right,Lateral Foot: Clean wound with Normal Saline. Wound #9 Left Metatarsal head first: Clean wound with Normal Saline. Anesthetic (add to Medication List): Wound #18 Right,Lateral Foot: Topical Lidocaine 4% cream applied to wound bed prior to debridement (In Clinic Only). Wound #6 Right,Lateral Malleolus: Topical Lidocaine 4% cream applied to wound bed prior to debridement (In Clinic Only). Wound #8 Right,Lateral Foot: Topical Lidocaine 4% cream applied to wound bed prior to debridement (In Clinic Only). Wound #9 Left Metatarsal head first: Topical Lidocaine 4% cream applied to wound bed prior to debridement (In Clinic Only). Primary Wound Dressing: Wound #6 Right,Lateral Malleolus: OLVIN, ROHR ([id number]) Santyl Ointment Wound #8 Right,Lateral Foot: Santyl Ointment Wound #18 Right,Lateral Foot: Silver Alginate Wound #9 Left Metatarsal head first: Alginate Secondary Dressing: Wound #6 Right,Lateral Malleolus: ABD pad Conform/Kerlix Wound #8 Right,Lateral Foot: ABD pad Conform/Kerlix Wound #9 Left Metatarsal head first: ABD pad Conform/Kerlix Dressing Change Frequency: Wound #6 Right,Lateral Malleolus: Change Dressing Monday, Wednesday, Friday Wound #8 Right,Lateral Foot: Change Dressing Monday, Wednesday,  Friday Wound #9 Left Metatarsal head first: Change Dressing Monday, Wednesday, Friday Follow-up Appointments: Wound #6 Right,Lateral Malleolus: Return Appointment in 2 weeks. Wound #8 Right,Lateral Foot: Return Appointment in 2 weeks. Wound #9 Left Metatarsal head first: Return Appointment in  2 weeks. Home Health: Wound #6 Right,Lateral Malleolus: [place] Visits - Monday, Wednesday and Friday on weeks patient does not come to wound care center. Home Health Nurse may visit PRN to address patient s wound care needs. FACE TO FACE ENCOUNTER: MEDICARE and MEDICAID PATIENTS: I certify that this patient is under my care and that I had a face-to-face encounter that meets the physician face-to-face encounter requirements with this patient on this date. The encounter with the patient was in whole or in part for the following MEDICAL CONDITION: (primary reason for [place]) MEDICAL NECESSITY: I certify, that based on my findings, NURSING services are a medically necessary home health service. HOME BOUND STATUS: I certify that my clinical findings support that this patient is homebound (i.e., Due to illness or injury, pt requires aid of supportive devices such as crutches, cane, wheelchairs, walkers, the use of special transportation or the assistance of another person to leave their place of residence. There is a normal inability to leave the home and doing so requires considerable and taxing effort. Other absences are for medical reasons / religious services and are infrequent or of short duration when for other reasons). If current dressing causes regression in wound condition, may D/C ordered dressing product/s and apply Normal Saline Moist Dressing daily until next [place] / Other MD appointment. [place] of regression in wound condition at [phone]. Please direct any NON-WOUND related issues/requests for orders to patient's Primary Care  Physician Wound #8 Right,Lateral Foot: [place] Visits - Monday, Wednesday and Friday on weeks patient does not come to wound care center. Home Health Nurse may visit PRN to address patient s wound care needs. FACE TO FACE ENCOUNTER: MEDICARE and MEDICAID PATIENTS: I certify that this patient is under my care and that I had a face-to-face encounter that meets the physician face-to-face encounter requirements with this patient on this date. The encounter with the patient was in whole or in part for the following MEDICAL CONDITION: (primary reason for [place]) MEDICAL NECESSITY: I certify, that based on my findings, NURSING services are a medically necessary home health service. HOME BOUND STATUS: I certify that my clinical findings support that this patient is homebound (i.e., Due to illness or injury, pt requires aid of supportive devices such as crutches, cane, wheelchairs, walkers, the use of special transportation or the assistance of another person to leave their place of residence. There is a normal inability to leave the home and doing so requires considerable and taxing effort. Other absences are for medical reasons / religious services and are infrequent or of short duration when for other reasons). GATLIN, KITTELL ([id number]) If current dressing causes regression in wound condition, may D/C ordered dressing product/s and apply Normal Saline Moist Dressing daily until next [place] / Other MD appointment. [place] of regression in wound condition at [phone]. Please direct any NON-WOUND related issues/requests for orders to patient's Primary Care Physician Wound #9 Left Metatarsal head first: [place] Visits - Monday, Wednesday and Friday on weeks patient does not come to wound care center. Home Health Nurse may visit PRN to address patient s wound care needs. FACE TO FACE ENCOUNTER: MEDICARE and MEDICAID PATIENTS: I  certify that this patient is under my care and that I had a face-to-face encounter that meets the physician face-to-face encounter requirements with this patient on this date. The encounter with the patient was in whole or in part  for the following MEDICAL CONDITION: (primary reason for Home Healthcare) MEDICAL NECESSITY: I certify, that based on my findings, NURSING services are a medically necessary home health service. HOME BOUND STATUS: I certify that my clinical findings support that this patient is homebound (i.e., Due to illness or injury, pt requires aid of supportive devices such as crutches, cane, wheelchairs, walkers, the use of special transportation or the assistance of another person to leave their place of residence. There is a normal inability to leave the home and doing so requires considerable and taxing effort. Other absences are for medical reasons / religious services and are infrequent or of short duration when for other reasons). If current dressing causes regression in wound condition, may D/C ordered dressing product/s and apply Normal Saline Moist Dressing daily until next [place] / Other MD appointment. [place] of regression in wound condition at [phone]. Please direct any NON-WOUND related issues/requests for orders to patient's Primary Care Physician #1 change the primary dressing from Endoform to Santyl. He is approved for [place] but there is no way to apply anything to the surface that currently exist #2 we put silver alginate over the blistered areas on the right lateral foot #3 I'm puzzled by the rash on the left leg. In talking the patient is wife this was not how the area on the right leg started at least I'm hopeful that this is not the evolution of a bigger problem in the left leg 4 he sees vascular surgery next week. I'm hopeful we'll recheck the blood flow in the right. He is apparently going to have an angiogram on the  left as well Electronic Signature(s) Signed: 10/28/2017 4:09:45 PM By: Linton Ham MD Entered By: Linton Ham on 10/28/2017 09:46:09 Peart, [name] ([id number]) -------------------------------------------------------------------------------- SuperBill Details Patient Name: [name] Date of Service: 10/28/2017 Medical Record Number: [id number] Patient Account Number: [id number] Date of Birth/Sex: [birth year] (76 y.o. M) Treating RN: Cornell Barman Primary Care Provider: Cyndi Bender Other Clinician: Referring Provider: Cyndi Bender Treating Provider/Extender: Tito Dine in Treatment: 29 Diagnosis Coding ICD-10 Codes Code Description 2028074349 Non-pressure chronic ulcer of other part of right foot with fat layer exposed L97.521 Non-pressure chronic ulcer of other part of left foot limited to breakdown of skin 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 of other part of foot I25.5 Ischemic cardiomyopathy Facility Procedures CPT4 Code: [id number] Description: 73736 - DEB SUBQ TISSUE 20 SQ CM/< ICD-10 Diagnosis Description L97.512 Non-pressure chronic ulcer of other part of right foot with fat l Modifier: ayer exposed Quantity: 1 Physician Procedures CPT4 Code: [id number] Description: 11042 - WC PHYS SUBQ TISS 20 SQ CM ICD-10 Diagnosis Description L97.512 Non-pressure chronic ulcer of other part of right foot with fat l Modifier: ayer exposed Quantity: 1 Electronic Signature(s) Signed: 10/28/2017 4:09:45 PM By: Linton Ham MD Entered By: Linton Ham on 10/28/2017 09:46:30

## 2017-10-31 NOTE — Progress Notes (Signed)
CONSTANTIN, HILLERY ([id number]) Visit Report for 10/28/2017 Arrival Information Details Patient Name: [name], [name] Date of Service: 10/28/2017 8:30 AM Medical Record Number: [id number] Patient Account Number: [id number] Date of Birth/Sex: 11/12/1940 (76 y.o. M) Treating RN: [name] Primary Care [name]: [name] Other Clinician: Referring [name]: [name] Treating [name]/Extender: [name] in Treatment: 5 Visit Information History Since Last Visit Added or deleted any medications: No Patient Arrived: Ambulatory Any new allergies or adverse reactions: No Arrival Time: 08:21 Had a fall or experienced change in No Accompanied By: spouse activities of daily living that may affect Transfer Assistance: None risk of falls: Patient Identification Verified: Yes Signs or symptoms of abuse/neglect since last visito No Secondary Verification Process Completed: Yes Hospitalized since last visit: No Patient Requires Transmission-Based No Implantable device outside of the clinic excluding No Precautions: cellular tissue based products placed in the center Patient Has Alerts: Yes since last visit: Patient Alerts: R ABI >220 Has Dressing in Place as Prescribed: Yes L ABI >220 Pain Present Now: No Electronic Signature(s) Signed: 10/28/2017 3:20:57 PM By: [name] Entered By: [name] on 10/28/2017 08:22:25 Burgner, [name] ([id number]) -------------------------------------------------------------------------------- Encounter Discharge Information Details Patient Name: [name] Date of Service: 10/28/2017 8:30 AM Medical Record Number: [id number] Patient Account Number: [id number] Date of Birth/Sex: 1940-03-16 (76 y.o. M) Treating RN: [name] Primary Care Pasty Manninen: [name] Other Clinician: Referring [name]: [name] Treating [name]/Extender: [name] in Treatment: 74 Encounter Discharge Information Items Discharge Condition: Stable Ambulatory Status: Ambulatory Discharge Destination: Home Transportation: Private Auto Accompanied By: wife Schedule Follow-up Appointment: No Clinical Summary of Care: Post Procedure Vitals: Temperature (F): 97.7 Pulse (bpm): 80 Respiratory Rate (breaths/min): 16 Blood Pressure (mmHg): 96/51 Electronic Signature(s) Signed: 10/28/2017 4:22:02 PM By: [name], BSN, RN, CWS, Kim RN, BSN Entered By: [name], BSN, RN, CWS, [name] on 10/28/2017 16:22:02 [name] ([id number]) -------------------------------------------------------------------------------- Lower Extremity Assessment Details Patient Name: [name] Date of Service: 10/28/2017 8:30 AM Medical Record Number: [id number] Patient Account Number: [id number] Date of Birth/Sex: Sep 28, 1940 (76 y.o. M) Treating RN: [name] Primary Care Abbygail [place]: [name] Other Clinician: Referring Maezie Justin: [name] Treating [name]/Extender: [name] in Treatment: 29 Vascular Assessment Pulses: Dorsalis Pedis Palpable: [Left:Yes] [Right:Yes] Posterior Tibial Extremity colors, hair growth, and conditions: Extremity Color: [Left:Normal] [Right:Normal] Hair Growth on Extremity: [Left:No] [Right:No] Temperature of Extremity: [Left:Warm] [Right:Warm] Capillary Refill: [Left:< 3 seconds] [Right:< 3 seconds] Toe Nail Assessment Left: Right: Thick: Yes Yes Discolored: Yes Yes Deformed: Yes Yes Improper Length and Hygiene: Yes Yes Electronic Signature(s) Signed: 10/30/2017 5:35:44 PM By: [name] Entered By: [name] on 10/28/2017 08:36:42 [name] ([id number]) -------------------------------------------------------------------------------- Multi Wound Chart Details Patient Name: [name] Date of Service: 10/28/2017 8:30 AM Medical Record Number: [id number] Patient Account  Number: [id number] Date of Birth/Sex: 03/26/1940 (76 y.o. M) Treating RN: [name] Primary Care Tymesha Ditmore: [name] Other Clinician: Referring Kysean Sweet: [name] Treating Adonnis Salceda/Extender: [name] in Treatment: 29 Vital Signs Height(in): 58 Pulse(bpm): 80 Weight(lbs): Blood Pressure(mmHg): 96/51 Body Mass Index(BMI): Temperature(F): 97.7 Respiratory Rate 16 (breaths/min): Photos: [18:No Photos] Wound Location: Right [name] - Lateral Right Malleolus - Lateral Right [name] - Lateral Wounding Event: Blister Gradually Appeared Gradually Appeared Primary Etiology: Trauma, Other Arterial Insufficiency Ulcer Arterial Insufficiency Ulcer Comorbid History: Congestive Heart Failure, Congestive Heart Failure, Congestive Heart Failure, Coronary Artery Disease, Coronary Artery Disease, Coronary Artery Disease, Hypertension, Myocardial Hypertension, Myocardial  Hypertension, Myocardial Infarction, Osteoarthritis Infarction, Osteoarthritis Infarction, Osteoarthritis Date Acquired: 10/12/2017 03/23/2017 04/29/2017 Weeks of Treatment: 0 29 26 Wound Status: Open Open Open Clustered Wound: Yes No No Measurements L x W x D 4x1x0.1 0.3x0.3x0.2 0.4x0.4x0.2 (cm) Area (cm) : 3.142 0.071 0.126 Volume (cm) : 0.314 0.014 0.025 % Reduction in Area: 0.00% -129.00% -77.50% % Reduction in Volume: 0.00% -366.70% -257.10% Classification: Full Thickness Without Full Thickness Without Full Thickness With Exposed Exposed Support Structures Exposed Support Structures Support Structures Exudate Amount: Medium Medium Medium Exudate Type: Serous Serous Serous Exudate Color: amber amber amber Wound Margin: Distinct, outline attached Flat and Intact Flat and Intact Granulation Amount: Large (67-100%) None Present (0%) None Present (0%) Necrotic Amount: None Present (0%) Medium (34-66%) Large (67-100%) Exposed Structures: Fat Layer (Subcutaneous Fascia: No Fascia: No Tissue) Exposed:  Yes Fat Layer (Subcutaneous Fat Layer (Subcutaneous Tissue) Exposed: No Tissue) Exposed: No Tendon: No Tendon: No Melnyk, Huxley E. ([id number]) Muscle: No Muscle: No Joint: No Joint: No Bone: No Bone: No Epithelialization: N/A None None Debridement: N/A Debridement - Excisional Debridement - Excisional Pre-procedure N/A 09:05 09:05 Verification/Time Out Taken: Pain Control: N/A Lidocaine Lidocaine Tissue Debrided: N/A Subcutaneous, Slough Subcutaneous, Slough Level: N/A Skin/Subcutaneous Tissue Skin/Subcutaneous Tissue Debridement Area (sq cm): N/A 0.09 0.16 Instrument: N/A Curette Curette Bleeding: N/A Minimum Minimum Hemostasis Achieved: N/A Pressure Pressure Debridement Treatment N/A Procedure was tolerated well Procedure was tolerated well Response: Post Debridement N/A 0.3x0.3x0.3 0.4x0.4x0.3 Measurements L x W x D (cm) Post Debridement Volume: N/A 0.021 0.038 (cm) Periwound Skin Texture: Excoriation: Yes Excoriation: No Excoriation: No Induration: No Induration: No Callus: No Callus: No Crepitus: No Crepitus: No Rash: No Rash: No Scarring: No Scarring: No Periwound Skin Moisture: No Abnormalities Noted Maceration: No Maceration: No Dry/Scaly: No Dry/Scaly: No Periwound Skin Color: No Abnormalities Noted Atrophie Blanche: No Atrophie Blanche: No Cyanosis: No Cyanosis: No Ecchymosis: No Ecchymosis: No Erythema: No Erythema: No Hemosiderin Staining: No Hemosiderin Staining: No Mottled: No Mottled: No Pallor: No Pallor: No Rubor: No Rubor: No Tenderness on Palpation: No No No Wound Preparation: N/A Ulcer Cleansing: Ulcer Cleansing: Rinsed/Irrigated with Saline Rinsed/Irrigated with Saline Topical Anesthetic Applied: Topical Anesthetic Applied: Other: lidocaine 4% Other: lidocaine 4% Procedures Performed: N/A Debridement Debridement Wound Number: 9 N/A N/A Photos: N/A N/A Wound Location: Left Metatarsal head first N/A N/A Wounding  Event: Gradually Appeared N/A N/A Primary Etiology: Pressure Ulcer N/A N/A Comorbid History: N/A N/A EILAN, MCINERNY ([id number]) Congestive Heart Failure, Coronary Artery Disease, Hypertension, Myocardial Infarction, Osteoarthritis Date Acquired: 05/06/2017 N/A N/A Weeks of Treatment: 23 N/A N/A Wound Status: Open N/A N/A Clustered Wound: No N/A N/A Measurements L x W x D 0.1x0.1x0.1 N/A N/A (cm) Area (cm) : 0.008 N/A N/A Volume (cm) : 0.001 N/A N/A % Reduction in Area: 95.90% N/A N/A % Reduction in Volume: 95.00% N/A N/A Classification: Category/Stage II N/A N/A Exudate Amount: None Present N/A N/A Exudate Type: N/A N/A N/A Exudate Color: N/A N/A N/A Wound Margin: Flat and Intact N/A N/A Granulation Amount: None Present (0%) N/A N/A Necrotic Amount: None Present (0%) N/A N/A Exposed Structures: Fascia: No N/A N/A Fat Layer (Subcutaneous Tissue) Exposed: No Tendon: No Muscle: No Joint: No Bone: No Epithelialization: Large (67-100%) N/A N/A Debridement: N/A N/A N/A Pain Control: N/A N/A N/A Tissue Debrided: N/A N/A N/A Level: N/A N/A N/A Debridement Area (sq cm): N/A N/A N/A Instrument: N/A N/A N/A Bleeding: N/A N/A N/A Hemostasis Achieved: N/A N/A N/A Debridement Treatment N/A N/A N/A Response: Post Debridement  N/A N/A N/A Measurements L x W x D (cm) Post Debridement Volume: N/A N/A N/A (cm) Periwound Skin Texture: Callus: Yes N/A N/A Excoriation: No Induration: No Crepitus: No Rash: No Scarring: No Periwound Skin Moisture: Maceration: No N/A N/A Dry/Scaly: No Periwound Skin Color: Atrophie Blanche: No N/A N/A Cyanosis: No Ecchymosis: No Erythema: No Hemosiderin Staining: No Magel, Zakry E. ([id number]) Mottled: No Pallor: No Rubor: No Tenderness on Palpation: No N/A N/A Wound Preparation: Ulcer Cleansing: N/A N/A Rinsed/Irrigated with Saline Topical Anesthetic Applied: Other: lidocaine 4% Procedures Performed: N/A N/A N/A Treatment  Notes Electronic Signature(s) Signed: 10/28/2017 4:09:45 PM By: Linton Ham MD Entered By: Linton Ham on 10/28/2017 09:35:31 Stillman, [name] ([id number]) -------------------------------------------------------------------------------- Multi-Disciplinary Care Plan Details Patient Name: [name] Date of Service: 10/28/2017 8:30 AM Medical Record Number: [id number] Patient Account Number: [id number] Date of Birth/Sex: 1940/02/09 (76 y.o. M) Treating RN: [name] Primary Care Marie Borowski: [name] Other Clinician: Referring Yoniel Arkwright: [name] Treating Yechiel Erny/Extender: [name] in Treatment: 62 Active Inactive ` Abuse / Safety / Falls / Self Care Management Nursing Diagnoses: History of Falls Goals: Patient will remain injury [name] related to falls Date Initiated: 04/08/2017 Target Resolution Date: 05/08/2017 Goal Status: Active Interventions: Assess fall risk on admission and as needed Notes: ` Orientation to the Wound Care Program Nursing Diagnoses: Knowledge deficit related to the wound healing center program Goals: Patient/caregiver will verbalize understanding of the [facility] Date Initiated: 04/08/2017 Target Resolution Date: 05/08/2017 Goal Status: Active Interventions: Provide education on orientation to the wound center Notes: ` Soft Tissue Infection Nursing Diagnoses: Impaired tissue integrity Potential for infection: soft tissue Goals: Patient will remain [name] of wound infection Date Initiated: 04/08/2017 Target Resolution Date: 05/08/2017 Goal Status: Active JAQUE, DACY ([id number]) Interventions: Assess signs and symptoms of infection every visit Notes: ` Wound/Skin Impairment Nursing Diagnoses: Impaired tissue integrity Goals: Ulcer/skin breakdown will heal within 14 weeks Date Initiated: 04/08/2017 Target Resolution Date: 07/20/2017 Goal Status: Active Interventions: Assess  patient/caregiver ability to perform ulcer/skin care regimen upon admission and as needed Provide education on ulcer and skin care Treatment Activities: Topical wound management initiated : 04/08/2017 Notes: Electronic Signature(s) Signed: 10/28/2017 4:45:51 PM By: [name], BSN, RN, CWS, Kim RN, BSN Entered By: [name], BSN, RN, CWS, [name] on 10/28/2017 09:07:44 [place], [name] ([id number]) -------------------------------------------------------------------------------- Non-Wound Condition Assessment Details Patient Name: [name] Date of Service: 10/28/2017 8:30 AM Medical Record Number: [id number] Patient Account Number: [id number] Date of Birth/Sex: 04-29-1940 (76 y.o. M) Treating RN: [name] Primary Care Breonia Kirstein: [name] Other Clinician: Referring Judaea Burgoon: [name] Treating Rachele Lamaster/Extender: [name] in Treatment: 29 Non-Wound Condition: Condition: Other Dermatologic Condition Location: [name] Side: Right Periwound Skin Texture Texture Color No Abnormalities Noted: No No Abnormalities Noted: No Moisture No Abnormalities Noted: No Notes patient with 2 blisters on his lateral right [name] Electronic Signature(s) Signed: 10/30/2017 5:35:44 PM By: [name] Entered By: [name] on 10/28/2017 08:37:29 Huegel, [name] ([id number]) -------------------------------------------------------------------------------- Pain Assessment Details Patient Name: [name] Date of Service: 10/28/2017 8:30 AM Medical Record Number: [id number] Patient Account Number: [id number] Date of Birth/Sex: [birth year] (76 y.o. M) Treating RN: [name] Primary Care Breck [place]: [name] Other Clinician: Referring Marykay Mccleod: [name] Treating Kitrina Maurin/Extender: [name] in Treatment: 29 Active Problems Location of Pain Severity and Description of Pain Patient Has Paino No Site Locations Pain Management and  Medication Current Pain Management: Electronic Signature(s) Signed: 10/28/2017 3:20:57 PM By: Juleen China,  RCP,RRT,[name], Sallie RCP, RRT, [name] Signed: 10/28/2017 4:45:51 PM By: [name], BSN, RN, CWS, Kim RN, BSN Entered By: [name] on 10/28/2017 08:22:31 Stephens, [name] ([id number]) -------------------------------------------------------------------------------- Patient/Caregiver Education Details Patient Name: [name] Date of Service: 10/28/2017 8:30 AM Medical Record Number: [id number] Patient Account Number: [id number] Date of Birth/Gender: 12-Apr-1940 (76 y.o. M) Treating RN: [name] Primary Care Physician: [name] Other Clinician: Referring Physician: Cyndi [name] Treating Physician/Extender: [name] in Treatment: 33 Education Assessment Education Provided To: Patient Education Topics Provided Wound/Skin Impairment: Handouts: Caring for Your Ulcer Methods: Demonstration, Explain/Verbal Responses: State content correctly Electronic Signature(s) Signed: 10/28/2017 4:45:51 PM By: [name], BSN, RN, CWS, Kim RN, BSN Entered By: [name], BSN, RN, CWS, [name] on 10/28/2017 16:22:23 Krus, [name] ([id number]) -------------------------------------------------------------------------------- Wound Assessment Details Patient Name: [name] Date of Service: 10/28/2017 8:30 AM Medical Record Number: [id number] Patient Account Number: [id number] Date of Birth/Sex: 1940/04/09 (76 y.o. M) Treating RN: [name] Primary Care Krikor Willet: [name] Other Clinician: Referring Ashlin Hidalgo: [name] Treating Chalese Peach/Extender: [name] in Treatment: 29 Wound Status Wound Number: 18 Primary Trauma, Other Etiology: Wound Location: Right [name] - Lateral Wound Open Wounding Event: Blister Status: Date Acquired: 10/12/2017 Comorbid Congestive Heart Failure, Coronary Artery Weeks Of Treatment: 0 History: Disease,  Hypertension, Myocardial Infarction, Clustered Wound: Yes Osteoarthritis Photos Photo Uploaded By: Secundino Ginger on 10/28/2017 10:16:16 Wound Measurements Length: (cm) 4 Width: (cm) 1 Depth: (cm) 0.1 Area: (cm) 3.142 Volume: (cm) 0.314 % Reduction in Area: 0% % Reduction in Volume: 0% Wound Description Full Thickness Without Exposed Support Classification: Structures Wound Margin: Distinct, outline attached Exudate Medium Amount: Exudate Type: Serous Exudate Color: amber Foul Odor After Cleansing: No Slough/Fibrino No Wound Bed Granulation Amount: Large (67-100%) Exposed Structure Necrotic Amount: None Present (0%) Fat Layer (Subcutaneous Tissue) Exposed: Yes Periwound Skin Texture Texture Color No Abnormalities Noted: No No Abnormalities Noted: No Excoriation: Yes Krol, Ryleigh E. ([id number]) Moisture No Abnormalities Noted: No Treatment Notes Wound #18 (Right, Lateral [name]) Notes silvercell Electronic Signature(s) Signed: 10/28/2017 4:45:51 PM By: [name], BSN, RN, CWS, Kim RN, BSN Entered By: [name], BSN, RN, CWS, [name] on 10/28/2017 09:15:11 [place], [name] ([id number]) -------------------------------------------------------------------------------- Wound Assessment Details Patient Name: [name] Date of Service: 10/28/2017 8:30 AM Medical Record Number: [id number] Patient Account Number: [id number] Date of Birth/Sex: Apr 18, 1940 (76 y.o. M) Treating RN: [name] Primary Care Sharmel Ballantine: [name] Other Clinician: Referring Chaniya Genter: [name] Treating Katheen Aslin/Extender: [name] in Treatment: 29 Wound Status Wound Number: 6 Primary Arterial Insufficiency Ulcer Etiology: Wound Location: Right Malleolus - Lateral Wound Open Wounding Event: Gradually Appeared Status: Date Acquired: 03/23/2017 Comorbid Congestive Heart Failure, Coronary Artery Weeks Of Treatment: 29 History: Disease, Hypertension, Myocardial  Infarction, Clustered Wound: No Osteoarthritis Photos Photo Uploaded By: [name] on 10/28/2017 08:41:24 Wound Measurements Length: (cm) 0.3 Width: (cm) 0.3 Depth: (cm) 0.2 Area: (cm) 0.071 Volume: (cm) 0.014 % Reduction in Area: -129% % Reduction in Volume: -366.7% Epithelialization: None Tunneling: No Undermining: No Wound Description Full Thickness Without Exposed Support Classification: Structures Wound Margin: Flat and Intact Exudate Medium Amount: Exudate Type: Serous Exudate Color: amber Foul Odor After Cleansing: No Slough/Fibrino Yes Wound Bed Granulation Amount: None Present (0%) Exposed Structure Necrotic Amount: Medium (34-66%) Fascia Exposed: No Necrotic Quality: Adherent Slough Fat Layer (Subcutaneous Tissue) Exposed: No Tendon Exposed: No Muscle Exposed: No Joint Exposed: No Bone Exposed: No Murcia, Timothy E. ([id number]) Periwound Skin Texture Texture Color No Abnormalities Noted:  No No Abnormalities Noted: No Callus: No Atrophie Blanche: No Crepitus: No Cyanosis: No Excoriation: No Ecchymosis: No Induration: No Erythema: No Rash: No Hemosiderin Staining: No Scarring: No Mottled: No Pallor: No Moisture Rubor: No No Abnormalities Noted: No Dry / Scaly: No Maceration: No Wound Preparation Ulcer Cleansing: Rinsed/Irrigated with Saline Topical Anesthetic Applied: Other: lidocaine 4%, Treatment Notes Wound #6 (Right, Lateral Malleolus) Notes santyl, ABD, gauze and kerlix Electronic Signature(s) Signed: 10/30/2017 5:35:44 PM By: [name] Entered By: [name] on 10/28/2017 08:34:49 Mcdonell, [name] ([id number]) -------------------------------------------------------------------------------- Wound Assessment Details Patient Name: [name] Date of Service: 10/28/2017 8:30 AM Medical Record Number: [id number] Patient Account Number: [id number] Date of Birth/Sex: 1941-01-01 (76 y.o. M) Treating RN:  [name] Primary Care Damica Gravlin: [name] Other Clinician: Referring Erion Hermans: [name] Treating Brittannie Tawney/Extender: [name] in Treatment: 29 Wound Status Wound Number: 8 Primary Arterial Insufficiency Ulcer Etiology: Wound Location: Right [name] - Lateral Wound Open Wounding Event: Gradually Appeared Status: Date Acquired: 04/29/2017 Comorbid Congestive Heart Failure, Coronary Artery Weeks Of Treatment: 26 History: Disease, Hypertension, Myocardial Infarction, Clustered Wound: No Osteoarthritis Photos Photo Uploaded By: [name] on 10/28/2017 08:41:27 Wound Measurements Length: (cm) 0.4 Width: (cm) 0.4 Depth: (cm) 0.2 Area: (cm) 0.126 Volume: (cm) 0.025 % Reduction in Area: -77.5% % Reduction in Volume: -257.1% Epithelialization: None Tunneling: No Undermining: No Wound Description Full Thickness With Exposed Support Classification: Structures Wound Margin: Flat and Intact Exudate Medium Amount: Exudate Type: Serous Exudate Color: amber Foul Odor After Cleansing: No Slough/Fibrino Yes Wound Bed Granulation Amount: None Present (0%) Exposed Structure Necrotic Amount: Large (67-100%) Fascia Exposed: No Necrotic Quality: Adherent Slough Fat Layer (Subcutaneous Tissue) Exposed: No Tendon Exposed: No Muscle Exposed: No Joint Exposed: No Bone Exposed: No Sheppard, Horald E. ([id number]) Periwound Skin Texture Texture Color No Abnormalities Noted: No No Abnormalities Noted: No Callus: No Atrophie Blanche: No Crepitus: No Cyanosis: No Excoriation: No Ecchymosis: No Induration: No Erythema: No Rash: No Hemosiderin Staining: No Scarring: No Mottled: No Pallor: No Moisture Rubor: No No Abnormalities Noted: No Dry / Scaly: No Maceration: No Wound Preparation Ulcer Cleansing: Rinsed/Irrigated with Saline Topical Anesthetic Applied: Other: lidocaine 4%, Treatment Notes Wound #8 (Right, Lateral  [name]) Notes santyl, ABD, gauze and kerlix Electronic Signature(s) Signed: 10/30/2017 5:35:44 PM By: [name] Entered By: [name] on 10/28/2017 08:35:08 Peyser, [name] ([id number]) -------------------------------------------------------------------------------- Wound Assessment Details Patient Name: [name] Date of Service: 10/28/2017 8:30 AM Medical Record Number: [id number] Patient Account Number: [id number] Date of Birth/Sex: [birth year] (76 y.o. M) Treating RN: [name] Primary Care Bradie Sangiovanni: [name] Other Clinician: Referring Arliene Rosenow: [name] Treating Dmani Mizer/Extender: [name] in Treatment: 29 Wound Status Wound Number: 9 Primary Pressure Ulcer Etiology: Wound Location: Left Metatarsal head first Wound Open Wounding Event: Gradually Appeared Status: Date Acquired: 05/06/2017 Comorbid Congestive Heart Failure, Coronary Artery Weeks Of Treatment: 23 History: Disease, Hypertension, Myocardial Infarction, Clustered Wound: No Osteoarthritis Photos Photo Uploaded By: [name] on 10/28/2017 08:41:41 Wound Measurements Length: (cm) 0.1 Width: (cm) 0.1 Depth: (cm) 0.1 Area: (cm) 0.008 Volume: (cm) 0.001 % Reduction in Area: 95.9% % Reduction in Volume: 95% Epithelialization: Large (67-100%) Tunneling: No Undermining: No Wound Description Classification: Category/Stage II Wound Margin: Flat and Intact Exudate Amount: None Present Foul Odor After Cleansing: No Slough/Fibrino No Wound Bed Granulation Amount: None Present (0%) Exposed Structure Necrotic Amount: None Present (0%) Fascia Exposed: No Fat Layer (Subcutaneous Tissue) Exposed: No Tendon Exposed: No Muscle Exposed: No Joint Exposed:  No Bone Exposed: No Periwound Skin Texture Texture Color No Abnormalities Noted: No No Abnormalities Noted: No Mould, Oree E. ([id number]) Callus: Yes Atrophie Blanche: No Crepitus: No Cyanosis:  No Excoriation: No Ecchymosis: No Induration: No Erythema: No Rash: No Hemosiderin Staining: No Scarring: No Mottled: No Pallor: No Moisture Rubor: No No Abnormalities Noted: No Dry / Scaly: No Maceration: No Wound Preparation Ulcer Cleansing: Rinsed/Irrigated with Saline Topical Anesthetic Applied: Other: lidocaine 4%, Treatment Notes Wound #9 (Left Metatarsal head first) Notes silvercell Electronic Signature(s) Signed: 10/30/2017 5:35:44 PM By: [name] Entered By: [name] on 10/28/2017 08:35:58 [place], [name] ([id number]) -------------------------------------------------------------------------------- [place] Details Patient Name: [name] Date of Service: 10/28/2017 8:30 AM Medical Record Number: [id number] Patient Account Number: [id number] Date of Birth/Sex: October 06, 1940 (76 y.o. M) Treating RN: [name] Primary Care Tayja Manzer: [name] Other Clinician: Referring Baptiste Littler: [name] Treating Elizzie Westergard/Extender: [name] in Treatment: 29 Vital Signs Time Taken: 08:22 Temperature (F): 97.7 Height (in): 68 Pulse (bpm): 80 Respiratory Rate (breaths/min): 16 Blood Pressure (mmHg): 96/51 Reference Range: 80 - 120 mg / dl Electronic Signature(s) Signed: 10/28/2017 3:20:57 PM By: [name] Entered By: Becky Sax, Amado Nash on 10/28/2017 08:26:04

## 2017-11-01 DIAGNOSIS — L97511 Non-pressure chronic ulcer of other part of right foot limited to breakdown of skin: Secondary | ICD-10-CM | POA: Diagnosis not present

## 2017-11-01 DIAGNOSIS — M069 Rheumatoid arthritis, unspecified: Secondary | ICD-10-CM | POA: Diagnosis not present

## 2017-11-01 DIAGNOSIS — G4730 Sleep apnea, unspecified: Secondary | ICD-10-CM | POA: Diagnosis not present

## 2017-11-01 DIAGNOSIS — I110 Hypertensive heart disease with heart failure: Secondary | ICD-10-CM | POA: Diagnosis not present

## 2017-11-01 DIAGNOSIS — Z48817 Encounter for surgical aftercare following surgery on the skin and subcutaneous tissue: Secondary | ICD-10-CM | POA: Diagnosis not present

## 2017-11-01 DIAGNOSIS — E039 Hypothyroidism, unspecified: Secondary | ICD-10-CM | POA: Diagnosis not present

## 2017-11-01 DIAGNOSIS — L84 Corns and callosities: Secondary | ICD-10-CM | POA: Diagnosis not present

## 2017-11-01 DIAGNOSIS — L97311 Non-pressure chronic ulcer of right ankle limited to breakdown of skin: Secondary | ICD-10-CM | POA: Diagnosis not present

## 2017-11-01 DIAGNOSIS — M545 Low back pain: Secondary | ICD-10-CM | POA: Diagnosis not present

## 2017-11-01 DIAGNOSIS — I255 Ischemic cardiomyopathy: Secondary | ICD-10-CM | POA: Diagnosis not present

## 2017-11-01 DIAGNOSIS — I5023 Acute on chronic systolic (congestive) heart failure: Secondary | ICD-10-CM | POA: Diagnosis not present

## 2017-11-01 DIAGNOSIS — I2510 Atherosclerotic heart disease of native coronary artery without angina pectoris: Secondary | ICD-10-CM | POA: Diagnosis not present

## 2017-11-02 ENCOUNTER — Encounter
Admission: RE | Disposition: A | Discharge status: Home / Self Care | Payer: Self-pay | Source: Ambulatory Visit | Attending: Vascular Surgery

## 2017-11-02 ENCOUNTER — Ambulatory Visit
Admission: RE | Admit: 2017-11-02 | Discharge: 2017-11-02 | Disposition: A | Discharge status: Home / Self Care | Payer: Medicare Other | Source: Ambulatory Visit | Attending: Vascular Surgery | Admitting: Vascular Surgery

## 2017-11-02 DIAGNOSIS — E039 Hypothyroidism, unspecified: Secondary | ICD-10-CM | POA: Diagnosis not present

## 2017-11-02 DIAGNOSIS — Z7982 Long term (current) use of aspirin: Secondary | ICD-10-CM | POA: Insufficient documentation

## 2017-11-02 DIAGNOSIS — E785 Hyperlipidemia, unspecified: Secondary | ICD-10-CM | POA: Diagnosis not present

## 2017-11-02 DIAGNOSIS — Z79890 Hormone replacement therapy (postmenopausal): Secondary | ICD-10-CM | POA: Diagnosis not present

## 2017-11-02 DIAGNOSIS — I70202 Unspecified atherosclerosis of native arteries of extremities, left leg: Secondary | ICD-10-CM | POA: Diagnosis not present

## 2017-11-02 DIAGNOSIS — I890 Lymphedema, not elsewhere classified: Secondary | ICD-10-CM | POA: Diagnosis not present

## 2017-11-02 DIAGNOSIS — R531 Weakness: Secondary | ICD-10-CM | POA: Insufficient documentation

## 2017-11-02 DIAGNOSIS — Z9841 Cataract extraction status, right eye: Secondary | ICD-10-CM | POA: Diagnosis not present

## 2017-11-02 DIAGNOSIS — Z951 Presence of aortocoronary bypass graft: Secondary | ICD-10-CM | POA: Insufficient documentation

## 2017-11-02 DIAGNOSIS — M1990 Unspecified osteoarthritis, unspecified site: Secondary | ICD-10-CM | POA: Diagnosis not present

## 2017-11-02 DIAGNOSIS — Z9842 Cataract extraction status, left eye: Secondary | ICD-10-CM | POA: Insufficient documentation

## 2017-11-02 DIAGNOSIS — M069 Rheumatoid arthritis, unspecified: Secondary | ICD-10-CM | POA: Diagnosis not present

## 2017-11-02 DIAGNOSIS — Z79899 Other long term (current) drug therapy: Secondary | ICD-10-CM | POA: Insufficient documentation

## 2017-11-02 DIAGNOSIS — Z888 Allergy status to other drugs, medicaments and biological substances status: Secondary | ICD-10-CM | POA: Insufficient documentation

## 2017-11-02 DIAGNOSIS — I509 Heart failure, unspecified: Secondary | ICD-10-CM | POA: Insufficient documentation

## 2017-11-02 DIAGNOSIS — I739 Peripheral vascular disease, unspecified: Secondary | ICD-10-CM | POA: Diagnosis not present

## 2017-11-02 DIAGNOSIS — I70212 Atherosclerosis of native arteries of extremities with intermittent claudication, left leg: Secondary | ICD-10-CM | POA: Insufficient documentation

## 2017-11-02 DIAGNOSIS — Z955 Presence of coronary angioplasty implant and graft: Secondary | ICD-10-CM | POA: Insufficient documentation

## 2017-11-02 DIAGNOSIS — I252 Old myocardial infarction: Secondary | ICD-10-CM | POA: Insufficient documentation

## 2017-11-02 DIAGNOSIS — G4730 Sleep apnea, unspecified: Secondary | ICD-10-CM | POA: Diagnosis not present

## 2017-11-02 DIAGNOSIS — Z95810 Presence of automatic (implantable) cardiac defibrillator: Secondary | ICD-10-CM | POA: Diagnosis not present

## 2017-11-02 DIAGNOSIS — Z881 Allergy status to other antibiotic agents status: Secondary | ICD-10-CM | POA: Insufficient documentation

## 2017-11-02 DIAGNOSIS — H9319 Tinnitus, unspecified ear: Secondary | ICD-10-CM | POA: Diagnosis not present

## 2017-11-02 DIAGNOSIS — Z9889 Other specified postprocedural states: Secondary | ICD-10-CM | POA: Insufficient documentation

## 2017-11-02 HISTORY — PX: LOWER EXTREMITY ANGIOGRAPHY: CATH118251

## 2017-11-02 SURGERY — LOWER EXTREMITY ANGIOGRAPHY
Anesthesia: Moderate Sedation | Laterality: Left

## 2017-11-02 MED ORDER — LIDOCAINE-EPINEPHRINE (PF) 1 %-1:200000 IJ SOLN
INTRAMUSCULAR | Status: AC
  Filled 2017-11-02: qty 30

## 2017-11-02 MED ORDER — ATORVASTATIN CALCIUM 10 MG PO TABS: 10.0000 mg | ORAL_TABLET | Freq: Every day | ORAL | 11 refills | Status: DC

## 2017-11-02 MED ORDER — FENTANYL CITRATE (PF) 100 MCG/2ML IJ SOLN
INTRAMUSCULAR | Status: DC | PRN
  Administered 2017-11-02: 50 ug via INTRAVENOUS
  Administered 2017-11-02: 25 ug via INTRAVENOUS

## 2017-11-02 MED ORDER — ONDANSETRON HCL 4 MG/2ML IJ SOLN: 4.0000 mg | Freq: Four times a day (QID) | INTRAMUSCULAR | Status: DC | PRN

## 2017-11-02 MED ORDER — HEPARIN SODIUM (PORCINE) 1000 UNIT/ML IJ SOLN
INTRAMUSCULAR | Status: AC
  Filled 2017-11-02: qty 1

## 2017-11-02 MED ORDER — MIDAZOLAM HCL 5 MG/5ML IJ SOLN
INTRAMUSCULAR | Status: AC
  Filled 2017-11-02: qty 5

## 2017-11-02 MED ORDER — CEFAZOLIN SODIUM-DEXTROSE 2-4 GM/100ML-% IV SOLN
INTRAVENOUS | Status: AC
  Administered 2017-11-02: 2 g via INTRAVENOUS
  Filled 2017-11-02: qty 100

## 2017-11-02 MED ORDER — HEPARIN SODIUM (PORCINE) 1000 UNIT/ML IJ SOLN
INTRAMUSCULAR | Status: DC | PRN
  Administered 2017-11-02: 4000 [IU] via INTRAVENOUS

## 2017-11-02 MED ORDER — FENTANYL CITRATE (PF) 100 MCG/2ML IJ SOLN
INTRAMUSCULAR | Status: AC
  Filled 2017-11-02: qty 2

## 2017-11-02 MED ORDER — HYDROMORPHONE HCL 1 MG/ML IJ SOLN
1.0000 mg | Freq: Once | INTRAMUSCULAR | Status: AC | PRN
  Administered 2017-11-02: 0.5 mg via INTRAVENOUS

## 2017-11-02 MED ORDER — SODIUM CHLORIDE 0.9 % IV SOLN
INTRAVENOUS | Status: DC
  Administered 2017-11-02: 12:00:00 via INTRAVENOUS

## 2017-11-02 MED ORDER — DEXTROSE 5 % IV SOLN
2.0000 g | Freq: Once | INTRAVENOUS | Status: DC
  Filled 2017-11-02: qty 20

## 2017-11-02 MED ORDER — HEPARIN (PORCINE) IN NACL 1000-0.9 UT/500ML-% IV SOLN
INTRAVENOUS | Status: AC
  Filled 2017-11-02: qty 1000

## 2017-11-02 MED ORDER — MIDAZOLAM HCL 2 MG/2ML IJ SOLN
INTRAMUSCULAR | Status: DC | PRN
  Administered 2017-11-02: 1 mg via INTRAVENOUS
  Administered 2017-11-02: 2 mg via INTRAVENOUS

## 2017-11-02 MED ORDER — HYDROMORPHONE HCL 1 MG/ML IJ SOLN
INTRAMUSCULAR | Status: AC
  Filled 2017-11-02: qty 0.5

## 2017-11-02 MED ORDER — SODIUM CHLORIDE FLUSH 0.9 % IV SOLN
INTRAVENOUS | Status: AC
  Filled 2017-11-02: qty 50

## 2017-11-02 MED ORDER — CEFAZOLIN SODIUM-DEXTROSE 2-4 GM/100ML-% IV SOLN
2.0000 g | Freq: Once | INTRAVENOUS | Status: AC
  Administered 2017-11-02: 2 g via INTRAVENOUS

## 2017-11-02 SURGICAL SUPPLY — 26 items
BALLN COYOTE OTW 2X100X150 (BALLOONS) ×3
BALLN ULTRVRSE 2.5X220X150 (BALLOONS) ×3
BALLN ULTRVRSE 2X150X150 (BALLOONS) ×3
BALLN ULTRVRSE 2X220X150 (BALLOONS) ×3
BALLOON COYOTE OTW 2X100X150 (BALLOONS) IMPLANT
BALLOON ULTRVRSE 2.5X220X150 (BALLOONS) IMPLANT
BALLOON ULTRVRSE 2X150X150 (BALLOONS) IMPLANT
BALLOON ULTRVRSE 2X220X150 (BALLOONS) IMPLANT
CATH BEACON 5 .038 100 VERT TP (CATHETERS) ×2 IMPLANT
CATH CXI SUPP 2.6F 150 ST (CATHETERS) ×2 IMPLANT
CATH CXI SUPP ANG 4FR 135 (CATHETERS) IMPLANT
CATH CXI SUPP ANG 4FR 135CM (CATHETERS) ×3
CATH PIG 70CM (CATHETERS) ×3 IMPLANT
CATH SEEKER .018X150 (CATHETERS) ×3 IMPLANT
DEVICE PRESTO INFLATION (MISCELLANEOUS) ×2 IMPLANT
DEVICE STARCLOSE SE CLOSURE (Vascular Products) ×3 IMPLANT
GUIDEWIRE PFTE-COATED .018X300 (WIRE) ×2 IMPLANT
GUIDEWIRE V14 300 (WIRE) ×2 IMPLANT
PACK ANGIOGRAPHY (CUSTOM PROCEDURE TRAY) ×3 IMPLANT
SHEATH BRITE TIP 5FRX11 (SHEATH) ×2 IMPLANT
SHEATH RAABE 6FRX70 (SHEATH) ×3 IMPLANT
SYR MEDRAD MARK V 150ML (SYRINGE) ×2 IMPLANT
TUBING CONTRAST HIGH PRESS 72 (TUBING) ×2 IMPLANT
WIRE G V18X300CM (WIRE) ×4 IMPLANT
WIRE J 3MM .035X145CM (WIRE) ×3 IMPLANT
WIRE MAGIC TORQUE 260C (WIRE) ×2 IMPLANT

## 2017-11-02 NOTE — Op Note (Signed)
[facility]  Percutaneous Study/Intervention Procedural Note   Date of Surgery: 11/02/2017  Surgeon(s):Jaslyne Beeck    Assistants:none  Pre-operative Diagnosis: PAD with claudication LLE  Post-operative diagnosis:  Same  Procedure(s) Performed:             1.  Ultrasound guidance for vascular access left femoral artery             2.  Catheter placement into left SFA from right femoral approach             3.  Aortogram and selective left lower extremity angiogram             4.  Percutaneous transluminal angioplasty of left posterior tibial artery and tibioperoneal trunk with 2 and 2.5 mm diameter balloons             5.  StarClose closure device right femoral artery  EBL: 5 cc  Contrast: 65 cc  Fluoro Time: 16.4 minutes  Moderate Conscious Sedation Time: approximately 50 minutes using 3 mg of Versed and 75 mcg of Fentanyl              Indications:  Patient is a 77 y.o.male with pain and numbness in his left foot.  He has undergone right lower extremity revascularization for ulceration with good results. The patient has noninvasive study showing markedly calcified vessels making the ABIs non-calculable but some reduction in digital pressure and waveforms on the left. The patient is brought in for angiography for further evaluation and potential treatment.   Risks and benefits are discussed and informed consent is obtained.   Procedure:  The patient was identified and appropriate procedural time out was performed.  The patient was then placed supine on the table and prepped and draped in the usual sterile fashion. Moderate conscious sedation was administered during a face to face encounter with the patient throughout the procedure with my supervision of the RN administering medicines and monitoring the patient's vital signs, pulse oximetry, telemetry and mental status throughout from the start of the procedure until the patient was taken to the recovery room.  Ultrasound was used to evaluate the right common femoral artery.  It was patent .  A digital ultrasound image was acquired.  A Seldinger needle was used to access the right common femoral artery under direct ultrasound guidance and a permanent image was performed.  A 0.035 J wire was advanced without resistance and a 5Fr sheath was placed.  Pigtail catheter was placed into the aorta and an AP aortogram was performed. This demonstrated normal renal arteries and normal aorta and iliac segments without significant stenosis. I then crossed the aortic bifurcation and advanced to the left femoral head. Selective left lower extremity angiogram was then performed. This demonstrated calcific but not stenotic common femoral arteries, profunda femoris artery, SFA, and popliteal arteries.  There is then a reasonably normal tibial trifurcation although the anterior tibial artery occluded in the proximal to mid segment with poor reconstitution distally.  The peroneal artery was patent without focal stenosis and was the dominant runoff distally.  This reconstituted the posterior tibial artery at the ankle that had a distal occlusion.  There was also significant disease in the proximal posterior tibial artery that was nearly occlusive and greater than 90% and in the proximal to mid posterior tibial artery which was in the 70 to 80% range.  The vessel normalized in the mid posterior tibial artery but then occluded in the distal posterior tibial artery.  It was felt that it was in the patient's best interest to proceed with intervention after these images to avoid a second procedure and a larger amount of contrast and fluoroscopy based off of the findings from the initial angiogram. The patient was systemically heparinized and a 6 [place] [employer] sheath was then placed over the [employer] wire. I then used a Kumpe catheter and the Magic torque wire to get down into the tibial trifurcation.  I then exchanged for a V 18 wire and a  CXI catheter.  I initially advanced into the anterior tibial artery and attempted to cross this occlusion.  Selective imaging of the anterior tibial arteries performed through the CXI catheter.  I tried both a V 18 wire and a 0.018 advantage wire in both a 0.035 CXI catheter and a 0.018 CXI catheter but could never cross the occlusion and gain intraluminal flow distally.  I then turned my attention back to the posterior tibial artery.  The 0.035 CXI catheter was taken into the tibioperoneal trunk and then a 0.018 advantage wire was used to cross the multiple stenoses in the proximal and mid segment and then advanced into the occlusion in the distal posterior tibial artery and I was able to get across this and get all the way into the foot.  I then used a 2.5 mm diameter by 22 cm length angioplasty balloon and brought this down into the posterior tibial artery.  It would not cross the calcific stenosis in the mid posterior tibial artery however.  I treated the proximal and mid posterior tibial arteries with an inflation up to 12 atm for 1 minute with a 2.5 mm diameter angioplasty balloon.  The proximal and mid posterior tibial arteries now had less than 30% residual stenosis.  I then downsized to a 2 mm diameter by 15 cm length angioplasty balloon and advanced this into the posterior tibial artery.  This still would not cross the distal posterior tibial artery calcific occlusion.  I took it down as far as it would go which was the mid to distal posterior tibial artery and inflated at 14 atm for 1 minute.  To try to cross the more distal occlusion, I exchanged for a 0.014 wire and a 0.014 balloon was selected.  This was a 2 mm balloon but this would not track through the calcific lesion in the distal posterior tibial artery either we had improve the proximal and mid posterior tibial artery which should help the collateral flow, but at this point I had no smaller balloons her other option for crossing the lesion. I  elected to terminate the procedure. The sheath was removed and StarClose closure device was deployed in the right femoral artery with excellent hemostatic result. The patient was taken to the recovery room in stable condition having tolerated the procedure well.  Findings:               Aortogram:  Normal renal arteries, normal aorta and iliac arteries without significant stenosis             Left lower Extremity:  Diffusely calcific but not stenotic common femoral arteries, profunda femoris artery, SFA, and popliteal arteries.  There is then a reasonably normal tibial trifurcation although the anterior tibial artery occluded in the proximal to mid segment with poor reconstitution distally.  The peroneal artery was patent without focal stenosis and was the dominant runoff distally.  This reconstituted the posterior tibial artery at the ankle that had a distal  occlusion.  There was also significant disease in the proximal posterior tibial artery that was nearly occlusive and greater than 90% and in the proximal to mid posterior tibial artery which was in the 70 to 80% range.  The vessel normalized in the mid posterior tibial artery but then occluded in the distal posterior tibial artery.   Disposition: Patient was taken to the recovery room in stable condition having tolerated the procedure well.  Complications: None  [name] 11/02/2017 1:43 PM   This note was created with Dragon Medical transcription system. Any errors in dictation are purely unintentional.

## 2017-11-02 NOTE — Progress Notes (Signed)
Dr. Lucky Cowboy at bedside: spoke with pt. And his spouse re: procedural results. Both verbalized understanding of conversation and follow-up in a month.

## 2017-11-02 NOTE — Progress Notes (Signed)
Pt. Med. With Dilaudid 0.5 mg IV for c/o severe Headache. Pt. Spouse states "he gets a lot of headaches, esp. With the weather changes. "

## 2017-11-02 NOTE — H&P (Signed)
[facility] History & Physical Update  The patient was interviewed and re-examined.  The patient's previous History and Physical has been reviewed and is unchanged.  There is no change in the plan of care. We plan to proceed with the scheduled procedure.  [name], MD  11/02/2017, 12:08 PM

## 2017-11-03 ENCOUNTER — Encounter: Payer: Self-pay | Admitting: Vascular Surgery

## 2017-11-03 DIAGNOSIS — I255 Ischemic cardiomyopathy: Secondary | ICD-10-CM | POA: Diagnosis not present

## 2017-11-03 DIAGNOSIS — I2510 Atherosclerotic heart disease of native coronary artery without angina pectoris: Secondary | ICD-10-CM | POA: Diagnosis not present

## 2017-11-03 DIAGNOSIS — L97311 Non-pressure chronic ulcer of right ankle limited to breakdown of skin: Secondary | ICD-10-CM | POA: Diagnosis not present

## 2017-11-03 DIAGNOSIS — L84 Corns and callosities: Secondary | ICD-10-CM | POA: Diagnosis not present

## 2017-11-03 DIAGNOSIS — L97511 Non-pressure chronic ulcer of other part of right foot limited to breakdown of skin: Secondary | ICD-10-CM | POA: Diagnosis not present

## 2017-11-03 DIAGNOSIS — Z48817 Encounter for surgical aftercare following surgery on the skin and subcutaneous tissue: Secondary | ICD-10-CM | POA: Diagnosis not present

## 2017-11-04 ENCOUNTER — Encounter: Payer: Medicare Other | Admitting: Internal Medicine

## 2017-11-04 DIAGNOSIS — E1151 Type 2 diabetes mellitus with diabetic peripheral angiopathy without gangrene: Secondary | ICD-10-CM | POA: Diagnosis not present

## 2017-11-04 DIAGNOSIS — L97311 Non-pressure chronic ulcer of right ankle limited to breakdown of skin: Secondary | ICD-10-CM | POA: Diagnosis not present

## 2017-11-04 DIAGNOSIS — I70233 Atherosclerosis of native arteries of right leg with ulceration of ankle: Secondary | ICD-10-CM | POA: Diagnosis not present

## 2017-11-04 DIAGNOSIS — I70235 Atherosclerosis of native arteries of right leg with ulceration of other part of foot: Secondary | ICD-10-CM | POA: Diagnosis not present

## 2017-11-04 DIAGNOSIS — L97312 Non-pressure chronic ulcer of right ankle with fat layer exposed: Secondary | ICD-10-CM | POA: Diagnosis not present

## 2017-11-04 DIAGNOSIS — L97512 Non-pressure chronic ulcer of other part of right foot with fat layer exposed: Secondary | ICD-10-CM | POA: Diagnosis not present

## 2017-11-04 DIAGNOSIS — L97521 Non-pressure chronic ulcer of other part of left foot limited to breakdown of skin: Secondary | ICD-10-CM | POA: Diagnosis not present

## 2017-11-04 DIAGNOSIS — I252 Old myocardial infarction: Secondary | ICD-10-CM | POA: Diagnosis not present

## 2017-11-04 DIAGNOSIS — E11621 Type 2 diabetes mellitus with foot ulcer: Secondary | ICD-10-CM | POA: Diagnosis not present

## 2017-11-06 DIAGNOSIS — I2510 Atherosclerotic heart disease of native coronary artery without angina pectoris: Secondary | ICD-10-CM | POA: Diagnosis not present

## 2017-11-06 DIAGNOSIS — Z48817 Encounter for surgical aftercare following surgery on the skin and subcutaneous tissue: Secondary | ICD-10-CM | POA: Diagnosis not present

## 2017-11-06 DIAGNOSIS — L97511 Non-pressure chronic ulcer of other part of right foot limited to breakdown of skin: Secondary | ICD-10-CM | POA: Diagnosis not present

## 2017-11-06 DIAGNOSIS — I255 Ischemic cardiomyopathy: Secondary | ICD-10-CM | POA: Diagnosis not present

## 2017-11-06 DIAGNOSIS — L97311 Non-pressure chronic ulcer of right ankle limited to breakdown of skin: Secondary | ICD-10-CM | POA: Diagnosis not present

## 2017-11-06 DIAGNOSIS — L84 Corns and callosities: Secondary | ICD-10-CM | POA: Diagnosis not present

## 2017-11-07 NOTE — Progress Notes (Signed)
Jermaine, [name] ([id number]) Visit Report for 11/04/2017 Debridement Details Patient Name: [name], [name] Date of Service: 11/04/2017 8:30 AM Medical Record Number: [id number] Patient Account Number: [id number] Date of Birth/Sex: [birth year] (76 y.o. M) Treating RN: Cornell Barman Primary Care Provider: Cyndi Bender Other Clinician: Referring Provider: Cyndi Bender Treating Provider/Extender: Tito Dine in Treatment: 30 Debridement Performed for Wound #6 Right,Lateral Malleolus Assessment: Performed By: Physician Ricard Dillon, MD Debridement Type: Debridement Severity of Tissue Pre Fat layer exposed Debridement: Level of Consciousness (Pre- Awake and Alert procedure): Pre-procedure Verification/Time Yes - 19:15 Out Taken: Start Time: 09:16 Pain Control: Lidocaine Total Area Debrided (L x W): 0.2 (cm) x 0.2 (cm) = 0.04 (cm) Tissue and other material Non-Viable, Slough, Slough debrided: Level: Non-Viable Tissue Debridement Description: Selective/Open Wound Instrument: Curette Bleeding: None End Time: 09:23 Response to Treatment: Procedure was tolerated well Level of Consciousness Awake and Alert (Post-procedure): Post Debridement Measurements of Total Wound Length: (cm) 0.2 Width: (cm) 0.2 Depth: (cm) 0.3 Volume: (cm) 0.009 Character of Wound/Ulcer Post Debridement: Stable Severity of Tissue Post Debridement: Fat layer exposed Post Procedure Diagnosis Same as Pre-procedure Electronic Signature(s) Signed: 11/04/2017 5:32:05 PM By: Linton Ham MD Signed: 11/05/2017 7:26:08 AM By: Gretta Cool, BSN, RN, CWS, Kim RN, BSN Entered By: Linton Ham on 11/04/2017 09:44:51 Schroepfer, [name] ([id number]) -------------------------------------------------------------------------------- Debridement Details Patient Name: [name] Date of Service: 11/04/2017 8:30 AM Medical Record Number: [id number] Patient Account Number: [id number] Date  of Birth/Sex: 21-Apr-1940 (76 y.o. M) Treating RN: Cornell Barman Primary Care Provider: Cyndi Bender Other Clinician: Referring Provider: Cyndi Bender Treating Provider/Extender: Tito Dine in Treatment: 30 Debridement Performed for Wound #8 Right,Distal,Lateral [name] Assessment: Performed By: Physician Ricard Dillon, MD Debridement Type: Debridement Severity of Tissue Pre Fat layer exposed Debridement: Level of Consciousness (Pre- Awake and Alert procedure): Pre-procedure Verification/Time Yes - 19:15 Out Taken: Start Time: 09:16 Pain Control: Lidocaine Total Area Debrided (L x W): 0.3 (cm) x 0.3 (cm) = 0.09 (cm) Tissue and other material Viable, Non-Viable, Slough, Slough debrided: Level: Non-Viable Tissue Debridement Description: Selective/Open Wound Instrument: Curette Bleeding: None End Time: 09:23 Response to Treatment: Procedure was tolerated well Level of Consciousness Awake and Alert (Post-procedure): Post Debridement Measurements of Total Wound Length: (cm) 0.3 Width: (cm) 0.3 Depth: (cm) 0.2 Volume: (cm) 0.014 Character of Wound/Ulcer Post Debridement: Stable Severity of Tissue Post Debridement: Fat layer exposed Post Procedure Diagnosis Same as Pre-procedure Electronic Signature(s) Signed: 11/04/2017 5:32:05 PM By: Linton Ham MD Signed: 11/05/2017 7:26:08 AM By: Gretta Cool, BSN, RN, CWS, Kim RN, BSN Entered By: Linton Ham on 11/04/2017 09:45:07 Boissonneault, [name] ([id number]) -------------------------------------------------------------------------------- HPI Details Patient Name: [name] Date of Service: 11/04/2017 8:30 AM Medical Record Number: [id number] Patient Account Number: [id number] Date of Birth/Sex: [birth year] (76 y.o. M) Treating RN: Cornell Barman Primary Care Provider: Cyndi Bender Other Clinician: Referring Provider: Cyndi Bender Treating Provider/Extender: Tito Dine in Treatment:  30 History of Present Illness HPI Description: 04/08/17; this is a complex 77 year old man referred here from [facility]. He had been referred there for bilateral lower extremity edema with ulcer formation predominantly on the right calf but also the right [name]. He had been receiving Unna boots bilaterally. The history here is long. He is not a diabetic however ICU looking through late 2018 he was worked up for chronic headaches, elevated inflammatory markers including C-reactive protein and ESR. He went on to actually have a left temporal artery biopsy wasn't that was  negative.he received about 6 weeks of high-dose prednisone 60 mg with improvement in his inflammatory markers. He was admitted to hospital in late November with ventricular tachycardia syncope. He has known ischemic cardiomyopathy. He was admitted in the hospital in mid December. Apparently this was precipitated by a syncopal spell falling out of his scooter while at [place]. There was ventricular arrhythmia. He has an implantable defibrillator and echocardiogram showed severe LV dysfunction with an EF of 20% and valvular regurgitations including mild AR, moderate MR. There was no stenosis. He ruled in for a non-ST elevation MI in the setting of V. tach.his wife states that sometime during this hospitalization she noted multiple areas of skin change on the right lower calf which became evident just after he left the hospital. He was back in hospital in February with acute renal failure hyponatremia. This responded to fluid resuscitation.. Interestingly I can't see much description of his right leg at that point in time.he was followed by Dr. Nehemiah Massed of dermatology for the necrotic wounds on his right leg. Apparently a biopsy was planned at one point but not done although in some notes that suggests it was. I cannot see these results area He was noted to have a lot of edema. Was treated with bilateral Unna boots edges  really helped with the swelling they have been using Bactroban to small open areas predominantly on the right anterior lower leg His history is complicated by the fact that he has rheumatoid arthritis followed by rheumatology. He is followed by neurology for disabling headaches. At one point this was felt to be giant cell arteritis although a left temporal biopsy was apparently negative. He was given a prolonged course of prednisone at 60 mg which managed his sedimentation rates but apparently did not prove improve the headaches. This is been tapered to off on by rheumatology on 03/13/17 Vascular had plans to do a venous reflux workup as well as arterial studies in May. They also wanted to get him a lymphedema pump. As mentioned he's been using bacitracin under Unna boot wraps to both lower legs 04/15/17; the patient arrives with most of his wounds improved. These are small punched out wounds. Most of them remaining ones are on the right anterior calf with the most problematic over the right lateral malleolus. There are no new areas. The symptom complex or potential symptom complex we are dealing with his chronic disabling headaches with inflammatory markers not responsive to prednisone and with a negative temporal biopsy, lower extremity weakness, skin ulcerations just on the right leg. We have managed to get his arterial studies moved up to April 30. He has a rheumatology consult at [facility] in June. He has seen dermatology locally, rheumatology locally, neurology locally. 04/22/17; small punched out areas on the right leg anteriorly posteriorly. Most of these appear to have closed over. Some of them have eschar over the surface. The most problematic area appears to be over the right lateral malleolus. We've been using prisma to all of this. He has arterial studies on April 30 and a rheumatology consult at [facility] on June 28 04/29/17; most of the small punched out areas on the right leg posteriorly are  closed. He has 2 or 3 openings anteriorly but most of these appear to be on the way to closing. Still problematically over the right lateral malleolus and right lateral [name] with almost ischemic-looking eschar. His arterial studies that I ordered are due to be done next week on the 30th so we should  have them available for our next visit hopefully. He also saw a rheumatologist at [facility] and according to the patient he did 8 vials of blood. Finally he has scaling rash on his left [name] and what looks to be at [facility] area on the left anterior leg 05/06/17; most of the small punched out areas on the right leg posteriorly and anteriorly are closed. He still has one small one anteriorly one over the right lateral malleolus and one over the right lateral [name]. PHINEHAS, GROUNDS ([id number]) He had his arterial studies they didn't seem to do waveform analysis not exactly sure why however in any case is ABIs were noncompressible bilaterally. They did provide TBIs although looking at the pressures it appears that his TBIs are quite normal. I therefore went ahead and debrided the area over the right lateral malleolus and the right lateral [name] 05/13/17; most of the small punched out areas on the right leg posteriorly and anteriorly are closed. He continues to have problematic areas over the right lateral malleolus and the right lateral [name]. He still requiring aggressive debridement of these 2 wounds using silver collagen I reviewed the note from rheumatology I don't think they came up with a specific diagnosis although he is known to have seropositive RA. They did a panel of lab work when I was able to see his his AMA was negative, anti-smooth muscle antibodies negative antineutrophil cytoplasmic antibodies negative,Liv/kia type 1 negative. Serum C3 and C4 were negative. I don't see his muscle enzymes specifically. He was referred back to his local rheumatologist for management of his known rheumatoid  arthritis.the patient states he still feels weak and fatigued. He states he has numbness in both feet and apparently is known to have neuropathy 05/20/17; the patient continues to have a difficult problem on the right lateral malleolus and the right lateral [name]. Both of these wounds have no viable surface even with attempts at debridement. He has a new wound on the left plantar metatarsal head which looks more like a superficial diabetic pressure related injury then part of this underlying issue he has. Most of the rest of the wounds on his legs look satisfactory. Mostly on the right calf.reviewed his arterial studies which showed noncompressible vessels bilaterally but the TBIs were quite normal. 05/27/17; no real improvement in the right lateral malleolus and right lateral [name]. In fact the right lateral [name] is now on bone. I gave him doxycycline empirically last week it appears that he developed photosensitivity was 70 the son in a tractor. I'll not give him any more of this. This is predominantly on his face and dorsal forearms and hands. I given this more as an anti- inflammatory. I'm going to have him seen by vascular surgery. His TBIs that he had done previously ordered by Dr. Brigitte Pulse were in the normal range They never did full arterial studies on him. he now has small punched out wounds on the right lateral ankle and right lateral [name]. I doubt these are ischemic however I would like a review of his macrovascular status. He does describe some pain at night. I'll reduce the compression from 3 layer to 2 layer and I'm not convinced that this is a macrovascular issue however I want to make sure. We've been using silver alginate 06/03/17; the patient's x-rays that I ordered last time of his right ankle and right lateral [name] did not show definite osteomyelitis. We've been using silver alginate. The wounds are not making any progress. The area on  the plantar left first metatarsal head however  appears to be better. The patient complains of weakness that is more of the fatigue. He says if he's walking his head will fall onto his chest and that his legs literally gave out on him. He did see neurology in the past however that was at a time where his workup was for temporal arteritis and headaches. 06/10/17; the patient is making no progress with the areas on the right lateral [name] and right lateral malleolus. In fact the area on the [name] probes to bone. X-ray did not show osteomyelitis. He went and saw vein and vascular on 06/04/17 he was felt to have significant reflux in the left greater saphenous vein over this is not in the area we are most concerned about. He could be offered ablation. He was not felt to have venous reflux noted in the right lower extremity. He was felt to have some degree of lymphedema and he was felt to be a candidate for compression pumps. Finally a diagnostic arteriogram was suggested which is really what I'm most interested in. The patient has wife wanted time to think about this, I think they were confused about interacting between venous and arterial discussions. I think it would be probably well worth going through the angiogram. Culture I did have the deeper area on the right lateral [name] showed a few Enterococcus faecalis. I would like to start her on amoxicillin which they will start today for one-week 500 3 times a day 06/17/17; the patient still has punched out areas on the right lateral [name] and right lateral malleolus. There is not a viable surface here. We have been using Santyl. He also has an area on the plantar aspect of his left first metatarsal head this also seems to be better 06/24/17; patient's angiogram as on 07/06/17; still has punched out areas on the right lateral [name] and right lateral malleolus to which we've been applying Santyl-based dressings. He also has a more superficial area on the left plantar first metatarsal head, using collagen  here 07/08/17; the patient had his angiogram. This perineal artery had a 70-80% stenosis. Similarly the posterior tibial artery also was diseased. Furthermore down the posterior tibial artery in the distal segment was a short stenosis of 80%. His major vessels in his thigh and only minor irregularity. He had percutaneous angioplasty of the proximal right peroneal artery. Also angioplasty of the tibial peroneal trunk and proximal posterior tibial artery as well as the mid to distal segment of the posterior tibial artery. He handled this remarkably well. The patient arrived with a new wound on his right anterior calf which I think is the reopening of one of the original small open areas 07/15/17; the areas on his right anterior calf is new. He has a new threatened area on the right tibia more superiorly which is not open yet but makes me wonder whether this is going to reopen as well. His original wounds on the right lateral [name] and right lateral malleolus are somewhat better in terms of surface Hammonds, Shalamar E. ([id number]) 07/22/17; the patient has a second open area on the right anterior. This was a threatened area superiorly last week. Each time this happens she has a small wound with a nephrotic cover. The areas on the right lateral [name] and right lateral malleolus are about the same. I did not attempt debridement in any of these today continuing with Santyl. oThe area on the left second metatarsal head looks better and  he is healed laceration injuries on the arm from a fall last week with only the ventral forearm wound left 08/05/17 08/05/17; 2 week follow-up. This is a patient who initially presented with a multitude of small punched out areas on the right anterior calf greater than left dorsal [name]. This was in the setting of a constitutional illness at which time he saw multiple specialists was worked up for various rheumatologic diseases including temporal arteritis. Most of the areas on the  right leg and a few on the left actually closed over however he he developed 2 small deep probing areas on the right lateral [name] and right lateral malleolus. He also has an area on the left plantar first metatarsal head which is gradually been getting better. He was revascularized by Dr. dew about 4 weeks ago. He went to see Dr. Nehemiah Massed on 07/30/17; from review the note in time to the patient there wasn't an obvious cause. He did do a shave biopsy of the right anterior leg ulcer rule out cancer or other etiologies such as some embolic. This looks like some form of vasculopathy to me although trying to explain why it so much worse on the right leg than the left is been difficult. We will await the biopsy results. Dr. Nehemiah Massed wanted to put Bactroban and this not sure what that will do for the areas that have a completely nonviable surface. I'd like to go back to [place]. 08/19/17-He is here in follow up evaluation for multiple ulcerations to the right lower extremity and right [name] and the left planter first metatarsal head ulcer. He has had biopsy to the right lateral proximal leg; no results available for review, patient and spouse report "negative" biopsy. We will continue with current treatment plan, using ace wrap compression to the right leg as his 15-64mHg compression stockings are providing suboptimal edema control. He will follow up in two weeks 09/02/17; the patient's biopsy done by dermatology/Dr. KNehemiah Massedwas apparently "negative". He has 2 open areas on the right calf one was the biopsy site on the right lateral calf. Most problematic wounds are on the right lateral malleolus and on the right lateral [name]. Both of these have some depth we've been using Santyl here. Also now a small open area remaining on the left plantar [name] 09/16/17 ; every week follow-up. The patient has been using Santyl to the area on the right lateral ankle and right lateral [name]. All the areas on the right calf  including the biopsy site are closed. He still has the area on the first metatarsal head on the left. We've been using collagen to this as well oBiweekly visit. The area on the left first metatarsal head is just about closed. We have a new tape injury on the dorsal left [name] that was caused by her nurse taking off his dressing. This is superficial. oHe still has difficult areas on the right lateral [name] and right lateral malleolus. He been using Endoform. I was hoping we were going to see better results than today. Still required debridement. I'm going to put [facility] through his insurance 10/14/17; the patient arrives today with really not a lot of change in the small punched-out areas on the right lateral [name] and right lateral malleolus. I have been using Endoform. He is also apparently okay for [facility] as he has a good secondary insurance to [employer] Nevertheless the wound bed is not ready for application 181/44/81 the patient arrives with not a lot of change in the  small punched out areas on the right lateral [name] and right lateral malleolus. We've been using Endoform. Also this week he arrives with 2 blisters on the right lateral [name]. He thinks this may have been secondary to a Band-Aid that pulled on the skin that was put over this area. He apparently also had unna paste on this area. Finally he has a fine macular rash on the left lower leg very pruritic. The cause of this is not clear he's been using hydrocortisone cream 11/04/17; the patient arrives with better looking wound surfaces on the right lateral [name] and right lateral malleolus. He has a blister on the right lateral [name] there is left a small open area just above the wounds. We have been using Santyl to the major areas. The patient had his angiogram on the left. There was nothing in the renal arteries aorta or iliac arteries he had diffusely calcific but nonstenotic common femoral arteries, , profunda femoris artery and popliteal  arteries. The anterior tibial artery was occluded with poor reconstruction distally. The peroneal artery was patent without focal stenosis and was the dominant runoff vessel. Posterior tibial artery re-constituted at the ankle I believe from collaterals from the peroneal artery. Proximal posterior tibial artery was nearly occluded with a greater than 90% the patient had a angioplasty of the left posterior tibial artery and tibioperoneal trunk. This procedure was on 11/02/17. Since then the patient has developed a petechial rash on the medial left calf and medial dorsal [name] as well as his toes. This is compatible with cholesterol emboli/atheromatous emboli. There is no open area. LILLIE, BOLLIG ([id number]) Electronic Signature(s) Signed: 11/04/2017 5:32:05 PM By: Linton Ham MD Entered By: Linton Ham on 11/04/2017 10:11:00 Morina, [name] ([id number]) -------------------------------------------------------------------------------- Physical Exam Details Patient Name: [name] Date of Service: 11/04/2017 8:30 AM Medical Record Number: [id number] Patient Account Number: [id number] Date of Birth/Sex: 1940-11-21 (76 y.o. M) Treating RN: Cornell Barman Primary Care Provider: Cyndi Bender Other Clinician: Referring Provider: Cyndi Bender Treating Provider/Extender: Tito Dine in Treatment: 30 Constitutional Patient is hypotensive.But appears well. Pulse regular and within target range for patient.[name] Respirations regular, non-labored and within target range.. Temperature is normal and within the target range for the patient.[name] appears in no distress. Cardiovascular X-needed telemetry she sounds like it's a lot better he had a tumor on her along the rib that's all less than never had pain. Notes Woman exam; right lateral malleolus and right lateral [name] have a better looking surface. I removed surface slough with a #3 curet. We have not managed to get any  improvement in the granulation to closing the wound to. He is approved for [facility]. oOn the right lateral [name] he has a single blistered area that was evacuated last week . This is still open but looks as though it shouldn't be a problem oThe area on the left first plantar metatarsal head is closed oMultiple small petechial areas on the left medial and left calf from about mid way down and [name]. There is petechiae on the toes. His dorsalis pedis pulse is palpable on the left which is an improvement. Electronic Signature(s) Signed: 11/04/2017 5:32:05 PM By: Linton Ham MD Entered By: Linton Ham on 11/04/2017 10:15:31 Napoleon, [name] ([id number]) -------------------------------------------------------------------------------- Physician Orders Details Patient Name: [name] Date of Service: 11/04/2017 8:30 AM Medical Record Number: [id number] Patient Account Number: [id number] Date of Birth/Sex: [birth year] (76 y.o. M) Treating RN: Cornell Barman Primary Care Provider: Cyndi Bender Other Clinician:  Referring Provider: Cyndi Bender Treating Provider/Extender: Tito Dine in Treatment: 30 Verbal / Phone Orders: No Diagnosis Coding Wound Cleansing Wound #6 Right,Lateral Malleolus o Clean wound with Normal Saline. Wound #8 Right,Lateral [name] o Clean wound with Normal Saline. Anesthetic (add to Medication List) Wound #6 Right,Lateral Malleolus o Topical Lidocaine 4% cream applied to wound bed prior to debridement (In Clinic Only). Wound #8 Right,Lateral [name] o Topical Lidocaine 4% cream applied to wound bed prior to debridement (In Clinic Only). Primary Wound Dressing Wound #6 Right,Lateral Malleolus o Santyl Ointment o Other: - Alginate on Blister Wound #8 Right,Lateral [name] o Santyl Ointment o Other: - Alginate on Blister Secondary Dressing Wound #6 Right,Lateral Malleolus o ABD pad o Conform/Kerlix Wound #8 Right,Lateral  [name] o ABD pad o Conform/Kerlix Dressing Change Frequency Wound #6 Right,Lateral Malleolus o Change Dressing Monday, Wednesday, Friday Wound #8 Right,Lateral [name] o Change Dressing Monday, Wednesday, Friday Follow-up Appointments Wound #6 Right,Lateral Malleolus o Return Appointment in 2 weeks. SHANT, HENCE ([id number]) Wound #8 Right,Lateral [name] o Return Appointment in 2 weeks. Home Health Wound #6 [place] Visits - Monday, Wednesday and Friday on weeks patient does not come to wound care center. o Home Health Nurse may visit PRN to address patientos wound care needs. o FACE TO FACE ENCOUNTER: MEDICARE and MEDICAID PATIENTS: I certify that this patient is under my care and that I had a face-to-face encounter that meets the physician face-to-face encounter requirements with this patient on this date. The encounter with the patient was in whole or in part for the following MEDICAL CONDITION: (primary reason for [place]) MEDICAL NECESSITY: I certify, that based on my findings, NURSING services are a medically necessary home health service. HOME BOUND STATUS: I certify that my clinical findings support that this patient is homebound (i.e., Due to illness or injury, pt requires aid of supportive devices such as crutches, cane, wheelchairs, walkers, the use of special transportation or the assistance of another person to leave their place of residence. There is a normal inability to leave the home and doing so requires considerable and taxing effort. Other absences are for medical reasons / religious services and are infrequent or of short duration when for other reasons). o If current dressing causes regression in wound condition, may D/C ordered dressing product/s and apply Normal Saline Moist Dressing daily until next [place] / Other MD appointment. Heath of regression in wound  condition at [phone]. o Please direct any NON-WOUND related issues/requests for orders to patient's Primary Care Physician Wound #8 [facility] Visits - Monday, Wednesday and Friday on weeks patient does not come to wound care center. o Home Health Nurse may visit PRN to address patientos wound care needs. o FACE TO FACE ENCOUNTER: MEDICARE and MEDICAID PATIENTS: I certify that this patient is under my care and that I had a face-to-face encounter that meets the physician face-to-face encounter requirements with this patient on this date. The encounter with the patient was in whole or in part for the following MEDICAL CONDITION: (primary reason for [place]) MEDICAL NECESSITY: I certify, that based on my findings, NURSING services are a medically necessary home health service. HOME BOUND STATUS: I certify that my clinical findings support that this patient is homebound (i.e., Due to illness or injury, pt requires aid of supportive devices such as crutches, cane, wheelchairs, walkers, the use of special transportation or the assistance of another  person to leave their place of residence. There is a normal inability to leave the home and doing so requires considerable and taxing effort. Other absences are for medical reasons / religious services and are infrequent or of short duration when for other reasons). o If current dressing causes regression in wound condition, may D/C ordered dressing product/s and apply Normal Saline Moist Dressing daily until next [place] / Other MD appointment. Cunningham of regression in wound condition at [phone]. o Please direct any NON-WOUND related issues/requests for orders to patient's Primary Care Physician Electronic Signature(s) Signed: 11/04/2017 5:32:05 PM By: Linton Ham MD Signed: 11/05/2017 7:26:08 AM By: Gretta Cool, BSN, RN, CWS, Kim RN, BSN Entered By: Gretta Cool, BSN,  RN, CWS, Kim on 11/04/2017 09:25:10 Mcpartlin, [name] ([id number]) -------------------------------------------------------------------------------- Problem List Details Patient Name: [name] Date of Service: 11/04/2017 8:30 AM Medical Record Number: [id number] Patient Account Number: [id number] Date of Birth/Sex: 24-Oct-1940 (76 y.o. 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 of 07/08/2017 No Yes other part of [name] I25.5 Ischemic cardiomyopathy 04/08/2017 No Yes Inactive Problems ICD-10 Code Description Active Date Inactive Date L97.212 Non-pressure chronic ulcer of right calf with fat layer exposed 04/08/2017 04/08/2017 Resolved Problems ICD-10 Code Description Active Date Resolved Date S41.111D Laceration without foreign body of right upper arm, subsequent 07/15/2017 07/15/2017 encounter Electronic Signature(s) Signed: 11/04/2017 5:32:05 PM By: Linton Ham MD Entered By: Linton Ham on 11/04/2017 09:44:17 Lorio, [name] ([id number]) -------------------------------------------------------------------------------- Progress Note Details Patient Name: [name] Date of Service: 11/04/2017 8:30 AM Medical Record Number: [id number] Patient Account Number: [id number] Date of Birth/Sex: 1940-08-31 (76 y.o. M) Treating RN: Cornell Barman Primary Care Provider: Cyndi Bender Other Clinician: Referring Provider: Cyndi Bender Treating Provider/Extender: Tito Dine in Treatment: 30 Subjective History of Present Illness (HPI) 04/08/17; this is a complex 77 year old man referred here from [facility]. He had been referred there for bilateral lower extremity edema with ulcer formation predominantly on the right calf but also the right [name]. He had been receiving Unna boots bilaterally. The history here is long. He is not a diabetic however ICU looking through late 2018 he was worked up for chronic headaches, elevated inflammatory markers including C-reactive protein and ESR. He went on to actually have a left temporal artery biopsy wasn't that was negative.he received about 6 weeks of high-dose prednisone 60 mg with improvement in his inflammatory markers. He was admitted to hospital in late November with ventricular tachycardia syncope. He has known ischemic cardiomyopathy. He was admitted in the hospital in mid December. Apparently this was precipitated by a syncopal spell falling out of his scooter while at [place]. There was ventricular arrhythmia. He has an implantable defibrillator and echocardiogram showed severe LV dysfunction with an EF of 20% and valvular regurgitations including mild AR, moderate MR. There was no stenosis. He ruled in for a non-ST elevation MI in the setting of V. tach.his wife states that sometime during this hospitalization she noted multiple areas of skin change on the right lower calf which became  evident just after he left the hospital. He was back in hospital in February with acute renal failure hyponatremia. This responded to fluid resuscitation.. Interestingly I can't see much description of his right leg at that point in time.he was followed by Dr. Nehemiah Massed of dermatology for the necrotic wounds on his right leg. Apparently a biopsy was planned at one point but not done although in some notes that suggests it was. I cannot see these results area He was noted to have a lot of edema. Was treated  with bilateral Unna boots edges really helped with the swelling they have been using Bactroban to small open areas predominantly on the right anterior lower leg His history is complicated by the fact that he has rheumatoid arthritis followed by rheumatology. He is followed by neurology for disabling headaches. At one point this was felt to be giant cell arteritis although a left temporal biopsy was apparently negative. He was given a prolonged course of prednisone at 60 mg which managed his sedimentation rates but apparently did not prove improve the headaches. This is been tapered to off on by rheumatology on 03/13/17 Vascular had plans to do a venous reflux workup as well as arterial studies in May. They also wanted to get him a lymphedema pump. As mentioned he's been using bacitracin under Unna boot wraps to both lower legs 04/15/17; the patient arrives with most of his wounds improved. These are small punched out wounds. Most of them remaining ones are on the right anterior calf with the most problematic over the right lateral malleolus. There are no new areas. The symptom complex or potential symptom complex we are dealing with his chronic disabling headaches with inflammatory markers not responsive to prednisone and with a negative temporal biopsy, lower extremity weakness, skin ulcerations just on the right leg. We have managed to get his arterial studies moved up to April 30. He has a rheumatology consult at [facility] in June. He has seen dermatology locally, rheumatology locally, neurology locally. 04/22/17; small punched out areas on the right leg anteriorly posteriorly. Most of these appear to have closed over. Some of them have eschar over the surface. The most problematic area appears to be over the right lateral malleolus. We've been using prisma to all of this. He has arterial studies on April 30 and a rheumatology consult at [facility] on June 28 04/29/17; most of the small punched out areas on the  right leg posteriorly are closed. He has 2 or 3 openings anteriorly but most of these appear to be on the way to closing. Still problematically over the right lateral malleolus and right lateral [name] with almost ischemic-looking eschar. His arterial studies that I ordered are due to be done next week on the 30th so we should have them available for our next visit hopefully. He also saw a rheumatologist at [facility] and according to the patient he did 8 vials of blood. Finally he has scaling rash on his left [name] and what looks to be at [facility] area on the left anterior leg 05/06/17; most of the small punched out areas on the right leg posteriorly and anteriorly are closed. He still has one small one Donofrio, [place] E. ([id number]) anteriorly one over the right lateral malleolus and one over the right lateral [name]. He had his arterial studies they didn't seem to do waveform analysis not exactly sure why however in any case is ABIs were noncompressible bilaterally. They did provide TBIs although looking at the pressures it appears  that his TBIs are quite normal. I therefore went ahead and debrided the area over the right lateral malleolus and the right lateral [name] 05/13/17; most of the small punched out areas on the right leg posteriorly and anteriorly are closed. He continues to have problematic areas over the right lateral malleolus and the right lateral [name]. He still requiring aggressive debridement of these 2 wounds using silver collagen I reviewed the note from rheumatology I don't think they came up with a specific diagnosis although he is known to have seropositive RA. They did a panel of lab work when I was able to see his his AMA was negative, anti-smooth muscle antibodies negative antineutrophil cytoplasmic antibodies negative,Liv/kia type 1 negative. Serum C3 and C4 were negative. I don't see his muscle enzymes specifically. He was referred back to his local rheumatologist for management of his  known rheumatoid arthritis.the patient states he still feels weak and fatigued. He states he has numbness in both feet and apparently is known to have neuropathy 05/20/17; the patient continues to have a difficult problem on the right lateral malleolus and the right lateral [name]. Both of these wounds have no viable surface even with attempts at debridement. He has a new wound on the left plantar metatarsal head which looks more like a superficial diabetic pressure related injury then part of this underlying issue he has. Most of the rest of the wounds on his legs look satisfactory. Mostly on the right calf.reviewed his arterial studies which showed noncompressible vessels bilaterally but the TBIs were quite normal. 05/27/17; no real improvement in the right lateral malleolus and right lateral [name]. In fact the right lateral [name] is now on bone. I gave him doxycycline empirically last week it appears that he developed photosensitivity was 61 the son in a tractor. I'll not give him any more of this. This is predominantly on his face and dorsal forearms and hands. I given this more as an anti- inflammatory. I'm going to have him seen by vascular surgery. His TBIs that he had done previously ordered by Dr. Brigitte Pulse were in the normal range They never did full arterial studies on him. he now has small punched out wounds on the right lateral ankle and right lateral [name]. I doubt these are ischemic however I would like a review of his macrovascular status. He does describe some pain at night. I'll reduce the compression from 3 layer to 2 layer and I'm not convinced that this is a macrovascular issue however I want to make sure. We've been using silver alginate 06/03/17; the patient's x-rays that I ordered last time of his right ankle and right lateral [name] did not show definite osteomyelitis. We've been using silver alginate. The wounds are not making any progress. The area on the plantar left first metatarsal  head however appears to be better. The patient complains of weakness that is more of the fatigue. He says if he's walking his head will fall onto his chest and that his legs literally gave out on him. He did see neurology in the past however that was at a time where his workup was for temporal arteritis and headaches. 06/10/17; the patient is making no progress with the areas on the right lateral [name] and right lateral malleolus. In fact the area on the [name] probes to bone. X-ray did not show osteomyelitis. He went and saw vein and vascular on 06/04/17 he was felt to have significant reflux in the left greater saphenous vein over this is  not in the area we are most concerned about. He could be offered ablation. He was not felt to have venous reflux noted in the right lower extremity. He was felt to have some degree of lymphedema and he was felt to be a candidate for compression pumps. Finally a diagnostic arteriogram was suggested which is really what I'm most interested in. The patient has wife wanted time to think about this, I think they were confused about interacting between venous and arterial discussions. I think it would be probably well worth going through the angiogram. Culture I did have the deeper area on the right lateral [name] showed a few Enterococcus faecalis. I would like to start her on amoxicillin which they will start today for one-week 500 3 times a day 06/17/17; the patient still has punched out areas on the right lateral [name] and right lateral malleolus. There is not a viable surface here. We have been using Santyl. He also has an area on the plantar aspect of his left first metatarsal head this also seems to be better 06/24/17; patient's angiogram as on 07/06/17; still has punched out areas on the right lateral [name] and right lateral malleolus to which we've been applying Santyl-based dressings. He also has a more superficial area on the left plantar first metatarsal head, using  collagen here 07/08/17; the patient had his angiogram. This perineal artery had a 70-80% stenosis. Similarly the posterior tibial artery also was diseased. Furthermore down the posterior tibial artery in the distal segment was a short stenosis of 80%. His major vessels in his thigh and only minor irregularity. He had percutaneous angioplasty of the proximal right peroneal artery. Also angioplasty of the tibial peroneal trunk and proximal posterior tibial artery as well as the mid to distal segment of the posterior tibial artery. He handled this remarkably well. The patient arrived with a new wound on his right anterior calf which I think is the reopening of one of the original small open areas 07/15/17; the areas on his right anterior calf is new. He has a new threatened area on the right tibia more superiorly which is not open yet but makes me wonder whether this is going to reopen as well. His original wounds on the right lateral [name] and Bossler, Farah E. ([id number]) right lateral malleolus are somewhat better in terms of surface 07/22/17; the patient has a second open area on the right anterior. This was a threatened area superiorly last week. Each time this happens she has a small wound with a nephrotic cover. The areas on the right lateral [name] and right lateral malleolus are about the same. I did not attempt debridement in any of these today continuing with Santyl. The area on the left second metatarsal head looks better and he is healed laceration injuries on the arm from a fall last week with only the ventral forearm wound left 08/05/17 08/05/17; 2 week follow-up. This is a patient who initially presented with a multitude of small punched out areas on the right anterior calf greater than left dorsal [name]. This was in the setting of a constitutional illness at which time he saw multiple specialists was worked up for various rheumatologic diseases including temporal arteritis. Most of the areas  on the right leg and a few on the left actually closed over however he he developed 2 small deep probing areas on the right lateral [name] and right lateral malleolus. He also has an area on the left plantar first metatarsal head which  is gradually been getting better. He was revascularized by Dr. dew about 4 weeks ago. He went to see Dr. Nehemiah Massed on 07/30/17; from review the note in time to the patient there wasn't an obvious cause. He did do a shave biopsy of the right anterior leg ulcer rule out cancer or other etiologies such as some embolic. This looks like some form of vasculopathy to me although trying to explain why it so much worse on the right leg than the left is been difficult. We will await the biopsy results. Dr. Nehemiah Massed wanted to put Bactroban and this not sure what that will do for the areas that have a completely nonviable surface. I'd like to go back to [place]. 08/19/17-He is here in follow up evaluation for multiple ulcerations to the right lower extremity and right [name] and the left planter first metatarsal head ulcer. He has had biopsy to the right lateral proximal leg; no results available for review, patient and spouse report "negative" biopsy. We will continue with current treatment plan, using ace wrap compression to the right leg as his 15-67mHg compression stockings are providing suboptimal edema control. He will follow up in two weeks 09/02/17; the patient's biopsy done by dermatology/Dr. KNehemiah Massedwas apparently "negative". He has 2 open areas on the right calf one was the biopsy site on the right lateral calf. Most problematic wounds are on the right lateral malleolus and on the right lateral [name]. Both of these have some depth we've been using Santyl here. Also now a small open area remaining on the left plantar [name] 09/16/17 ; every week follow-up. The patient has been using Santyl to the area on the right lateral ankle and right lateral [name]. All the areas on the right  calf including the biopsy site are closed. He still has the area on the first metatarsal head on the left. We've been using collagen to this as well Biweekly visit. The area on the left first metatarsal head is just about closed. We have a new tape injury on the dorsal left [name] that was caused by her nurse taking off his dressing. This is superficial. He still has difficult areas on the right lateral [name] and right lateral malleolus. He been using Endoform. I was hoping we were going to see better results than today. Still required debridement. I'm going to put Oasis through his insurance 10/14/17; the patient arrives today with really not a lot of change in the small punched-out areas on the right lateral [name] and right lateral malleolus. I have been using Endoform. He is also apparently okay for Oasis as he has a good secondary insurance to [employer] Nevertheless the wound bed is not ready for application 192/33/00 the patient arrives with not a lot of change in the small punched out areas on the right lateral [name] and right lateral malleolus. We've been using Endoform. Also this week he arrives with 2 blisters on the right lateral [name]. He thinks this may have been secondary to a Band-Aid that pulled on the skin that was put over this area. He apparently also had unna paste on this area. Finally he has a fine macular rash on the left lower leg very pruritic. The cause of this is not clear he's been using hydrocortisone cream 11/04/17; the patient arrives with better looking wound surfaces on the right lateral [name] and right lateral malleolus. He has a blister on the right lateral [name] there is left a small open area just above the wounds. We  have been using Santyl to the major areas. The patient had his angiogram on the left. There was nothing in the renal arteries aorta or iliac arteries he had diffusely calcific but nonstenotic common femoral arteries, , profunda femoris artery and popliteal  arteries. The anterior tibial artery was occluded with poor reconstruction distally. The peroneal artery was patent without focal stenosis and was the dominant runoff vessel. Posterior tibial artery re-constituted at the ankle I believe from collaterals from the peroneal artery. Proximal posterior tibial artery was nearly occluded with a greater than 90% the patient had a angioplasty of the left posterior tibial artery and tibioperoneal trunk. This procedure was on 11/02/17. Since then the patient has developed a petechial rash on the medial left calf and medial dorsal [name] as well as his toes. This is compatible with cholesterol emboli/atheromatous emboli. There is no open area. CHRISHON, MARTINO ([id number]) Objective Constitutional Patient is hypotensive.But appears well. Pulse regular and within target range for patient.[name] Respirations regular, non-labored and within target range.. Temperature is normal and within the target range for the patient.[name] appears in no distress. Vitals Time Taken: 8:27 AM, Height: 68 in, Source: Stated, Weight: 161 lbs, Source: Stated, BMI: 24.5, Temperature: 97.8 F, Pulse: 74 bpm, Respiratory Rate: 16 breaths/min, Blood Pressure: 96/58 mmHg. Cardiovascular X-needed telemetry she sounds like it's a lot better he had a tumor on her along the rib that's all less than never had pain. General Notes: Woman exam; right lateral malleolus and right lateral [name] have a better looking surface. I removed surface slough with a #3 curet. We have not managed to get any improvement in the granulation to closing the wound to. He is approved for Oasis. On the right lateral [name] he has a single blistered area that was evacuated last week . This is still open but looks as though it shouldn't be a problem The area on the left first plantar metatarsal head is closed Multiple small petechial areas on the left medial and left calf from about mid way down and [name]. There is petechiae on  the toes. His dorsalis pedis pulse is palpable on the left which is an improvement. Integumentary (Hair, Skin) Wound #18 status is Open. Original cause of wound was Blister. The wound is located on the Right,Proximal,Lateral [name]. The wound measures 0.5cm length x 0.6cm width x 0cm depth; 0.236cm^2 area and 0.024cm^3 volume. Wound #6 status is Open. Original cause of wound was Gradually Appeared. The wound is located on the Right,Lateral Malleolus. The wound measures 0.2cm length x 0.2cm width x 0.3cm depth; 0.031cm^2 area and 0.009cm^3 volume. There is no tunneling or undermining noted. There is a medium amount of serous drainage noted. The wound margin is flat and intact. There is no granulation within the wound bed. There is a medium (34-66%) amount of necrotic tissue within the wound bed including Adherent Slough. The periwound skin appearance did not exhibit: Callus, Crepitus, Excoriation, Induration, Rash, Scarring, Dry/Scaly, Maceration, Atrophie Blanche, Cyanosis, Ecchymosis, Hemosiderin Staining, Mottled, Pallor, Rubor, Erythema. Wound #8 status is Open. Original cause of wound was Gradually Appeared. The wound is located on the Right,Distal,Lateral [name]. The wound measures 0.3cm length x 0.3cm width x 0.2cm depth; 0.071cm^2 area and 0.014cm^3 volume. There is no tunneling or undermining noted. There is a medium amount of serous drainage noted. The wound margin is flat and intact. There is no granulation within the wound bed. There is a large (67-100%) amount of necrotic tissue within the wound bed including Adherent Slough.  The periwound skin appearance did not exhibit: Callus, Crepitus, Excoriation, Induration, Rash, Scarring, Dry/Scaly, Maceration, Atrophie Blanche, Cyanosis, Ecchymosis, Hemosiderin Staining, Mottled, Pallor, Rubor, Erythema. Wound #9 status is Healed - Epithelialized. Original cause of wound was Gradually Appeared. The wound is located on the Left Metatarsal head  first. The wound measures 0cm length x 0cm width x 0cm depth; 0cm^2 area and 0cm^3 volume. There is no tunneling or undermining noted. There is a none present amount of drainage noted. The wound margin is flat and intact. There is no granulation within the wound bed. There is no necrotic tissue within the wound bed. The periwound skin appearance exhibited: Callus. The periwound skin appearance did not exhibit: Crepitus, Excoriation, Induration, Rash, Scarring, Dry/Scaly, Maceration, Atrophie Blanche, Cyanosis, Ecchymosis, Hemosiderin Staining, Mottled, Pallor, Rubor, Erythema. 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 There was a Selective/Open Wound Non-Viable Tissue Debridement with a total area of 0.04 sq cm performed by Ricard Dillon, MD. With the following instrument(s): Curette to remove Non- Viable tissue/material. Material removed includes [facility] after achieving pain control using Lidocaine. No specimens were taken. A time out was conducted at 19:15, prior to the start of the procedure. There was no bleeding. The procedure was tolerated well. Post Debridement Measurements: 0.2cm length x 0.2cm width x 0.3cm depth; 0.009cm^3 volume. Character of Wound/Ulcer Post Debridement is stable. Severity of Tissue Post Debridement is: Fat layer exposed. Post procedure Diagnosis Wound #6: Same as Pre-Procedure Wound #8 Pre-procedure diagnosis of Wound #8 is an Arterial Insufficiency Ulcer located on the  Right,Distal,Lateral [name] .Severity of Tissue Pre Debridement is: Fat layer exposed. There was a Selective/Open Wound Non-Viable Tissue Debridement with a total area of 0.09 sq cm performed by Ricard Dillon, MD. With the following instrument(s): Curette to remove Viable and Non-Viable tissue/material. Material removed includes [place] after achieving pain control using Lidocaine. No specimens were taken. A time out was conducted at 19:15, prior to the start of the procedure. There was no bleeding. The procedure was tolerated well. Post Debridement Measurements: 0.3cm length x 0.3cm width x 0.2cm depth; 0.014cm^3 volume. Character of Wound/Ulcer Post Debridement is stable. Severity of Tissue Post Debridement is: Fat layer exposed. Post procedure Diagnosis Wound #8: Same as Pre-Procedure Plan Wound Cleansing: Wound #6 Right,Lateral Malleolus: Clean wound with Normal Saline. Wound #8 Right,Lateral [name]: Clean wound with Normal Saline. Anesthetic (add to Medication List): Wound #6 Right,Lateral Malleolus: Topical Lidocaine 4% cream applied to wound bed prior to debridement (In Clinic Only). Wound #8 Right,Lateral [name]: Topical Lidocaine 4% cream applied to wound bed prior to debridement (In Clinic Only). Primary Wound Dressing: FRANKIE, SCIPIO ([id number]) Wound #6 Right,Lateral Malleolus: Santyl Ointment Other: - Alginate on Blister Wound #8 Right,Lateral [name]: Santyl Ointment Other: - Alginate on Blister Secondary Dressing: Wound #6 Right,Lateral Malleolus: ABD pad Conform/Kerlix Wound #8 Right,Lateral [name]: ABD pad Conform/Kerlix Dressing Change Frequency: Wound #6 Right,Lateral Malleolus: Change Dressing Monday, Wednesday, Friday Wound #8 Right,Lateral [name]: Change Dressing Monday, Wednesday, Friday Follow-up Appointments: Wound #6 Right,Lateral Malleolus: Return Appointment in 2 weeks. Wound #8 Right,Lateral [name]: Return Appointment in  2 weeks. Home Health: Wound #6  Right,Lateral Malleolus: [place] [facility] Visits - Monday, Wednesday and Friday on weeks patient does not come to wound care center. Home Health Nurse may visit PRN to address patient s wound care needs. FACE TO FACE ENCOUNTER: MEDICARE and MEDICAID PATIENTS: I certify that this patient is under my care and that I had a face-to-face encounter that meets the physician face-to-face encounter requirements with this patient on this date. The encounter with the patient was in whole or in part for the following MEDICAL CONDITION: (primary reason for [place]) MEDICAL NECESSITY: I certify, that based on my findings, NURSING services are a medically necessary home health service. HOME BOUND STATUS: I certify that my clinical findings support that this patient is homebound (i.e., Due to illness or injury, pt requires aid of supportive devices such as crutches, cane, wheelchairs, walkers, the use of special transportation or the assistance of another person to leave their place of residence. There is a normal inability to leave the home and doing so requires considerable and taxing effort. Other absences are for medical reasons / religious services and are infrequent or of short duration when for other reasons). If current dressing causes regression in wound condition, may D/C ordered dressing product/s and apply Normal Saline Moist Dressing daily until next [place] / Other MD appointment. [place] of regression in wound condition at [phone]. Please direct any NON-WOUND related issues/requests for orders to patient's Primary Care Physician Wound #8 Right,Lateral [name]: [place] Visits - Monday, Wednesday and Friday on weeks patient does not come to wound care center. Home Health Nurse may visit PRN to address patient s wound care needs. FACE TO FACE ENCOUNTER: MEDICARE and MEDICAID PATIENTS: I certify that this patient is under my care and that  I had a face-to-face encounter that meets the physician face-to-face encounter requirements with this patient on this date. The encounter with the patient was in whole or in part for the following MEDICAL CONDITION: (primary reason for [place]) MEDICAL NECESSITY: I certify, that based on my findings, NURSING services are a medically necessary home health service. HOME BOUND STATUS: I certify that my clinical findings support that this patient is homebound (i.e., Due to illness or injury, pt requires aid of supportive devices such as crutches, cane, wheelchairs, walkers, the use of special transportation or the assistance of another person to leave their place of residence. There is a normal inability to leave the home and doing so requires considerable and taxing effort. Other absences are for medical reasons / religious services and are infrequent or of short duration when for other reasons). If current dressing causes regression in wound condition, may D/C ordered dressing product/s and apply Normal Saline Moist Dressing daily until next [place] / Other MD appointment. [place] of regression in wound condition at [phone]. Please direct any NON-WOUND related issues/requests for orders to patient's Primary Care Physician [place], RODOLFO GASTER ([id number]) #1 looking at the condition of the left leg after the procedure may be related again think about atheromatous emboli as a cause of what happened on the right although there was nothing in the aorta. #2 both of her wounds on the right lateral malleolus and the right lateral [name] looked better after removal of surface left lobe of there is been no improvement in the depth. he is approved for [place] although I don't think we have a good enough surface to apply this  yet #3 I've advised him to keep an eye on the small petechial areas on his distal left calf and [name] if anything deteriorates to call his vascular  surgeon. Electronic Signature(s) Signed: 11/04/2017 5:32:05 PM By: Linton Ham MD Entered By: Linton Ham on 11/04/2017 10:17:48 Dinh, [name] ([id number]) -------------------------------------------------------------------------------- SuperBill Details Patient Name: [name] Date of Service: 11/04/2017 Medical Record Number: [id number] Patient Account Number: [id number] Date of Birth/Sex: 1940-08-20 (76 y.o. 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 I25.5 Ischemic cardiomyopathy Facility Procedures CPT4 Code Description: [id number] - DEBRIDE WOUND 1ST 20 SQ CM OR < ICD-10 Diagnosis Description 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 of L97.512 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 with fat lay Modifier: other part of f er exposed Quantity: 1 oot Physician Procedures CPT4 Code Description: [id number] - WC PHYS DEBR WO ANESTH 20 SQ CM ICD-10 Diagnosis Description 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 of L97.512 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 with fat lay Modifier: other part of fo er exposed Quantity: 1 ot Electronic Signature(s) Signed: 11/04/2017 5:32:05 PM By: Linton Ham MD Entered By: Linton Ham on 11/04/2017 10:18:56

## 2017-11-07 NOTE — Progress Notes (Signed)
RONTAVIOUS, ALBRIGHT ([id number]) Visit Report for 11/04/2017 Arrival Information Details Patient Name: [name], [name] Date of Service: 11/04/2017 8:30 AM Medical Record Number: [id number] Patient Account Number: [id number] Date of Birth/Sex: Jun 12, 1940 (76 y.o. M) Treating RN: Cornell Barman Primary Care Jalene Lacko: Cyndi Bender Other Clinician: Referring Johnwesley Lederman: Cyndi Bender Treating Aarnav Steagall/Extender: Tito Dine in Treatment: 13 Visit Information History Since Last Visit Added or deleted any medications: No Patient Arrived: Ambulatory Any new allergies or adverse reactions: No Arrival Time: 08:27 Had a fall or experienced change in No Accompanied By: self activities of daily living that may affect Transfer Assistance: None risk of falls: Patient Identification Verified: Yes Signs or symptoms of abuse/neglect since last visito No Secondary Verification Process Completed: Yes Hospitalized since last visit: No Patient Requires Transmission-Based No Implantable device outside of the clinic excluding No Precautions: cellular tissue based products placed in the center Patient Has Alerts: Yes since last visit: Patient Alerts: R ABI >220 Has Dressing in Place as Prescribed: Yes L ABI >220 Pain Present Now: No Electronic Signature(s) Signed: 11/04/2017 4:40:44 PM By: Lorine Bears RCP, RRT, CHT Entered By: Lorine Bears on 11/04/2017 08:27:38 Dunne, [name] ([id number]) -------------------------------------------------------------------------------- Encounter Discharge Information Details Patient Name: [name] Date of Service: 11/04/2017 8:30 AM Medical Record Number: [id number] Patient Account Number: [id number] Date of Birth/Sex: 12-26-1940 (76 y.o. M) Treating RN: Cornell Barman Primary Care Sabine Tenenbaum: Cyndi Bender Other Clinician: Referring Mckaylah Bettendorf: Cyndi Bender Treating Ovid Witman/Extender: Tito Dine  in Treatment: 30 Encounter Discharge Information Items Post Procedure Vitals Discharge Condition: Stable Temperature (F): 97.8 Ambulatory Status: Walker Pulse (bpm): 74 Discharge Destination: Home Respiratory Rate (breaths/min): 16 Transportation: Private Auto Blood Pressure (mmHg): 96/58 Accompanied By: self Schedule Follow-up Appointment: Yes Clinical Summary of Care: Electronic Signature(s) Signed: 11/04/2017 12:22:06 PM By: Gretta Cool, BSN, RN, CWS, Kim RN, BSN Entered By: Gretta Cool, BSN, RN, CWS, Kim on 11/04/2017 12:22:06 [name] ([id number]) -------------------------------------------------------------------------------- Lower Extremity Assessment Details Patient Name: [name] Date of Service: 11/04/2017 8:30 AM Medical Record Number: [id number] Patient Account Number: [id number] Date of Birth/Sex: Jul 16, 1940 (76 y.o. M) Treating RN: Montey Hora Primary Care Marcus Groll: Cyndi Bender Other Clinician: Referring Tyaisha Cullom: Cyndi Bender Treating Callee Rohrig/Extender: Tito Dine in Treatment: 30 Vascular Assessment Pulses: Dorsalis Pedis Palpable: [Left:Yes] [Right:Yes] Posterior Tibial Extremity colors, hair growth, and conditions: Extremity Color: [Left:Normal] [Right:Normal] Hair Growth on Extremity: [Left:No] [Right:No] Temperature of Extremity: [Left:Warm] [Right:Warm] Capillary Refill: [Left:< 3 seconds] [Right:< 3 seconds] Toe Nail Assessment Left: Right: Thick: Yes Yes Discolored: Yes Yes Deformed: Yes Yes Improper Length and Hygiene: No No Electronic Signature(s) Signed: 11/04/2017 5:41:21 PM By: Montey Hora Entered By: Montey Hora on 11/04/2017 08:42:16 [place], [name] ([id number]) -------------------------------------------------------------------------------- Multi Wound Chart Details Patient Name: [name] Date of Service: 11/04/2017 8:30 AM Medical Record Number: [id number] Patient Account Number:  [id number] Date of Birth/Sex: 29-Jul-1940 (76 y.o. M) Treating RN: Cornell Barman Primary Care Ryden Wainer: Cyndi Bender Other Clinician: Referring Lalaine Overstreet: Cyndi Bender Treating Vicenta Olds/Extender: Tito Dine in Treatment: 30 Vital Signs Height(in): 68 Pulse(bpm): 74 Weight(lbs): 161 Blood Pressure(mmHg): 96/58 Body Mass Index(BMI): 24 Temperature(F): 97.8 Respiratory Rate 16 (breaths/min): Photos: Wound Location: Right, Lateral [name] Right Malleolus - Lateral Right [name] - Lateral Wounding Event: Blister Gradually Appeared Gradually Appeared Primary Etiology: Trauma, Other Arterial Insufficiency Ulcer Arterial Insufficiency Ulcer Comorbid History: N/A Congestive Heart Failure, Congestive Heart Failure, Coronary Artery Disease, Coronary Artery Disease, Hypertension, Myocardial Hypertension, Myocardial Infarction, Osteoarthritis Infarction, Osteoarthritis Date Acquired:  10/12/2017 03/23/2017 04/29/2017 Weeks of Treatment: 1 30 27  Wound Status: Open Open Open Clustered Wound: Yes No No Measurements L x W x D 0.5x0.6x0 0.2x0.2x0.3 0.3x0.3x0.2 (cm) Area (cm) : 0.236 0.031 0.071 Volume (cm) : 0.024 0.009 0.014 % Reduction in Area: 92.50% 0.00% 0.00% % Reduction in Volume: 92.40% -200.00% -100.00% Classification: Full Thickness Without Full Thickness Without Full Thickness With Exposed Exposed Support Structures Exposed Support Structures Support Structures Exudate Amount: N/A Medium Medium Exudate Type: N/A Serous Serous Exudate Color: N/A amber amber Wound Margin: N/A Flat and Intact Flat and Intact Granulation Amount: N/A None Present (0%) None Present (0%) Necrotic Amount: N/A Medium (34-66%) Large (67-100%) Epithelialization: N/A None None Debridement: N/A Debridement - Selective/Open Debridement - Selective/Open Wound Wound N/A 19:15 19:15 KIMBALL, APPLEBY ([id number]) Pre-procedure Verification/Time Out Taken: Pain Control: N/A Lidocaine Lidocaine Tissue  Debrided: N/A [facility] Level: N/A Non-Viable Tissue Non-Viable Tissue Debridement Area (sq cm): N/A 0.04 0.09 Instrument: N/A Curette Curette Bleeding: N/A None None Debridement Treatment N/A Procedure was tolerated well Procedure was tolerated well Response: Post Debridement N/A 0.2x0.2x0.3 0.3x0.3x0.2 Measurements L x W x D (cm) Post Debridement Volume: N/A 0.009 0.014 (cm) Periwound Skin Texture: No Abnormalities Noted Excoriation: No Excoriation: No Induration: No Induration: No Callus: No Callus: No Crepitus: No Crepitus: No Rash: No Rash: No Scarring: No Scarring: No Periwound Skin Moisture: No Abnormalities Noted Maceration: No Maceration: No Dry/Scaly: No Dry/Scaly: No Periwound Skin Color: No Abnormalities Noted Atrophie Blanche: No Atrophie Blanche: No Cyanosis: No Cyanosis: No Ecchymosis: No Ecchymosis: No Erythema: No Erythema: No Hemosiderin Staining: No Hemosiderin Staining: No Mottled: No Mottled: No Pallor: No Pallor: No Rubor: No Rubor: No Tenderness on Palpation: No No No Wound Preparation: N/A Ulcer Cleansing: Ulcer Cleansing: Rinsed/Irrigated with Saline Rinsed/Irrigated with Saline Topical Anesthetic Applied: Topical Anesthetic Applied: Other: lidocaine 4% Other: lidocaine 4% Procedures Performed: N/A Debridement Debridement Wound Number: 9 N/A N/A Photos: N/A N/A Wound Location: Left Metatarsal head first N/A N/A Wounding Event: Gradually Appeared N/A N/A Primary Etiology: Pressure Ulcer N/A N/A Comorbid History: Congestive Heart Failure, N/A N/A Coronary Artery Disease, Hypertension, Myocardial Infarction, Osteoarthritis Date Acquired: 05/06/2017 N/A N/A Weeks of Treatment: 24 N/A N/A Wound Status: Healed - Epithelialized N/A N/A Casanas, JORRELL KUSTER. ([id number]) Clustered Wound: No N/A N/A Measurements L x W x D 0x0x0 N/A N/A (cm) Area (cm) : 0 N/A N/A Volume (cm) : 0 N/A N/A % Reduction in Area: 100.00% N/A N/A %  Reduction in Volume: 100.00% N/A N/A Classification: Category/Stage II N/A N/A Exudate Amount: None Present N/A N/A Exudate Type: N/A N/A N/A Exudate Color: N/A N/A N/A Wound Margin: Flat and Intact N/A N/A Granulation Amount: None Present (0%) N/A N/A Necrotic Amount: None Present (0%) N/A N/A Exposed Structures: Fascia: No N/A N/A Fat Layer (Subcutaneous Tissue) Exposed: No Tendon: No Muscle: No Joint: No Bone: No Epithelialization: Large (67-100%) N/A N/A Debridement: N/A N/A N/A Pain Control: N/A N/A N/A Tissue Debrided: N/A N/A N/A Level: N/A N/A N/A Debridement Area (sq cm): N/A N/A N/A Instrument: N/A N/A N/A Bleeding: N/A N/A N/A Debridement Treatment N/A N/A N/A Response: Post Debridement N/A N/A N/A Measurements L x W x D (cm) Post Debridement Volume: N/A N/A N/A (cm) Periwound Skin Texture: Callus: Yes N/A N/A Excoriation: No Induration: No Crepitus: No Rash: No Scarring: No Periwound Skin Moisture: Maceration: No N/A N/A Dry/Scaly: No Periwound Skin Color: Atrophie Blanche: No N/A N/A Cyanosis: No Ecchymosis: No Erythema: No Hemosiderin Staining: No Mottled: No  Pallor: No Rubor: No Tenderness on Palpation: No N/A N/A Wound Preparation: Ulcer Cleansing: N/A N/A Rinsed/Irrigated with Saline Topical Anesthetic Applied: Other: lidocaine 4% TREV, BOLEY ([id number]) Procedures Performed: N/A N/A N/A Treatment Notes Electronic Signature(s) Signed: 11/04/2017 5:32:05 PM By: Linton Ham MD Entered By: Linton Ham on 11/04/2017 09:44:31 Ghosh, [name] ([id number]) -------------------------------------------------------------------------------- [place] Details Patient Name: [name] Date of Service: 11/04/2017 8:30 AM Medical Record Number: [id number] Patient Account Number: [id number] Date of Birth/Sex: 05-Dec-1940 (76 y.o. M) Treating RN: Cornell Barman Primary Care AmeLie Hollars: Cyndi Bender Other  Clinician: Referring Adilenne Ashworth: Cyndi Bender Treating Cyncere Sontag/Extender: Tito Dine in Treatment: 30 Active Inactive ` Abuse / Safety / Falls / Self Care Management Nursing Diagnoses: History of Falls Goals: Patient will remain injury free related to falls Date Initiated: 04/08/2017 Target Resolution Date: 05/08/2017 Goal Status: Active Interventions: Assess fall risk on admission and as needed Notes: ` Orientation to the Wound Care Program Nursing Diagnoses: Knowledge deficit related to the wound healing center program Goals: Patient/caregiver will verbalize understanding of the [facility] Program Date Initiated: 04/08/2017 Target Resolution Date: 05/08/2017 Goal Status: Active Interventions: Provide education on orientation to the wound center Notes: ` Soft Tissue Infection Nursing Diagnoses: Impaired tissue integrity Potential for infection: soft tissue Goals: Patient will remain free of wound infection Date Initiated: 04/08/2017 Target Resolution Date: 05/08/2017 Goal Status: Active HILARIO, ROBARTS ([id number]) Interventions: Assess signs and symptoms of infection every visit Notes: ` Wound/Skin Impairment Nursing Diagnoses: Impaired tissue integrity Goals: Ulcer/skin breakdown will heal within 14 weeks Date Initiated: 04/08/2017 Target Resolution Date: 07/20/2017 Goal Status: Active Interventions: Assess patient/caregiver ability to perform ulcer/skin care regimen upon admission and as needed Provide education on ulcer and skin care Treatment Activities: Topical wound management initiated : 04/08/2017 Notes: Electronic Signature(s) Signed: 11/05/2017 7:26:08 AM By: Gretta Cool, BSN, RN, CWS, Kim RN, BSN Entered By: Gretta Cool, BSN, RN, CWS, Kim on 11/04/2017 09:16:44 [place], [name] ([id number]) -------------------------------------------------------------------------------- Pain Assessment Details Patient Name: [name] Date of Service:  11/04/2017 8:30 AM Medical Record Number: [id number] Patient Account Number: [id number] Date of Birth/Sex: Apr 06, 1940 (76 y.o. M) Treating RN: Cornell Barman Primary Care Laraya Pestka: Cyndi Bender Other Clinician: Referring Eean Buss: Cyndi Bender Treating Vayla Wilhelmi/Extender: Tito Dine in Treatment: 30 Active Problems Location of Pain Severity and Description of Pain Patient Has Paino No Site Locations Pain Management and Medication Current Pain Management: Electronic Signature(s) Signed: 11/04/2017 4:40:44 PM By: Lorine Bears RCP, RRT, CHT Signed: 11/05/2017 7:26:08 AM By: Gretta Cool, BSN, RN, CWS, Kim RN, BSN Entered By: Lorine Bears on 11/04/2017 08:27:45 Netter, [name] ([id number]) -------------------------------------------------------------------------------- Patient/Caregiver Education Details Patient Name: [name] Date of Service: 11/04/2017 8:30 AM Medical Record Number: [id number] Patient Account Number: [id number] Date of Birth/Gender: 08-30-1940 (76 y.o. M) Treating RN: Cornell Barman Primary Care Physician: Cyndi Bender Other Clinician: Referring Physician: Cyndi Bender Treating Physician/Extender: Tito Dine in Treatment: 30 Education Assessment Education Provided To: Patient Education Topics Provided Welcome To [facility]: Wound/Skin Impairment: Handouts: Caring for Your Ulcer Methods: Demonstration, Explain/Verbal Responses: State content correctly Electronic Signature(s) Signed: 11/05/2017 7:26:08 AM By: Gretta Cool, BSN, RN, CWS, Kim RN, BSN Entered By: Gretta Cool, BSN, RN, CWS, Kim on 11/04/2017 09:25:37 Paxson, [name] ([id number]) -------------------------------------------------------------------------------- Wound Assessment Details Patient Name: [name] Date of Service: 11/04/2017 8:30 AM Medical Record Number: [id number] Patient Account Number: [id number] Date of  Birth/Sex: 19-Aug-1940 (76 y.o. M) Treating RN: Cornell Barman Primary  Care Kymere Fullington: Cyndi Bender Other Clinician: Referring Sourish Allender: Cyndi Bender Treating Darlys Buis/Extender: Tito Dine in Treatment: 30 Wound Status Wound Number: 18 Primary Etiology: Trauma, Other Wound Location: Right, Lateral [name] Wound Status: Open Wounding Event: Blister Date Acquired: 10/12/2017 Weeks Of Treatment: 1 Clustered Wound: Yes Photos Photo Uploaded By: Montey Hora on 11/04/2017 09:42:42 Wound Measurements Length: (cm) 0.5 Width: (cm) 0.6 Depth: (cm) 0 Area: (cm) 0.236 Volume: (cm) 0.024 % Reduction in Area: 92.5% % Reduction in Volume: 92.4% Wound Description Full Thickness Without Exposed Support Classification: Structures Periwound Skin Texture Texture Color No Abnormalities Noted: No No Abnormalities Noted: No Moisture No Abnormalities Noted: No Electronic Signature(s) Signed: 11/05/2017 7:26:08 AM By: Gretta Cool, BSN, RN, CWS, Kim RN, BSN Entered By: Gretta Cool, BSN, RN, CWS, Kim on 11/04/2017 09:29:02 [place], [name] ([id number]) -------------------------------------------------------------------------------- Wound Assessment Details Patient Name: [name] Date of Service: 11/04/2017 8:30 AM Medical Record Number: [id number] Patient Account Number: [id number] Date of Birth/Sex: July 16, 1940 (76 y.o. M) Treating RN: Montey Hora Primary Care Saylor Murry: Cyndi Bender Other Clinician: Referring Javiana Anwar: Cyndi Bender Treating Granvel Proudfoot/Extender: Tito Dine in Treatment: 30 Wound Status Wound Number: 6 Primary Arterial Insufficiency Ulcer Etiology: Wound Location: Right Malleolus - Lateral Wound Open Wounding Event: Gradually Appeared Status: Date Acquired: 03/23/2017 Comorbid Congestive Heart Failure, Coronary Artery Weeks Of Treatment: 30 History: Disease, Hypertension, Myocardial Infarction, Clustered Wound:  No Osteoarthritis Photos Photo Uploaded By: Montey Hora on 11/04/2017 09:42:43 Wound Measurements Length: (cm) 0.2 Width: (cm) 0.2 Depth: (cm) 0.3 Area: (cm) 0.031 Volume: (cm) 0.009 % Reduction in Area: 0% % Reduction in Volume: -200% Epithelialization: None Tunneling: No Undermining: No Wound Description Full Thickness Without Exposed Support Classification: Structures Wound Margin: Flat and Intact Exudate Medium Amount: Exudate Type: Serous Exudate Color: amber Foul Odor After Cleansing: No Slough/Fibrino Yes Wound Bed Granulation Amount: None Present (0%) Exposed Structure Necrotic Amount: Medium (34-66%) Fascia Exposed: No Necrotic Quality: Adherent Slough Fat Layer (Subcutaneous Tissue) Exposed: No Tendon Exposed: No Muscle Exposed: No Joint Exposed: No Bone Exposed: No Craigie, Erving E. ([id number]) Periwound Skin Texture Texture Color No Abnormalities Noted: No No Abnormalities Noted: No Callus: No Atrophie Blanche: No Crepitus: No Cyanosis: No Excoriation: No Ecchymosis: No Induration: No Erythema: No Rash: No Hemosiderin Staining: No Scarring: No Mottled: No Pallor: No Moisture Rubor: No No Abnormalities Noted: No Dry / Scaly: No Maceration: No Wound Preparation Ulcer Cleansing: Rinsed/Irrigated with Saline Topical Anesthetic Applied: Other: lidocaine 4%, Treatment Notes Wound #6 (Right, Lateral Malleolus) Notes Right Proximal [name]-Sil[place], others: Santyl gauze and conform to secure. Electronic Signature(s) Signed: 11/04/2017 5:41:21 PM By: Montey Hora Entered By: Montey Hora on 11/04/2017 08:41:07 Pritz, [name] ([id number]) -------------------------------------------------------------------------------- Wound Assessment Details Patient Name: [name] Date of Service: 11/04/2017 8:30 AM Medical Record Number: [id number] Patient Account Number: [id number] Date of Birth/Sex: [birth year] (76 y.o.  M) Treating RN: Montey Hora Primary Care Lainey Nelson: Cyndi Bender Other Clinician: Referring Jude Naclerio: Cyndi Bender Treating Locklyn Henriquez/Extender: Tito Dine in Treatment: 30 Wound Status Wound Number: 8 Primary Arterial Insufficiency Ulcer Etiology: Wound Location: Right [name] - Lateral Wound Open Wounding Event: Gradually Appeared Status: Date Acquired: 04/29/2017 Comorbid Congestive Heart Failure, Coronary Artery Weeks Of Treatment: 27 History: Disease, Hypertension, Myocardial Infarction, Clustered Wound: No Osteoarthritis Photos Photo Uploaded By: Montey Hora on 11/04/2017 09:43:38 Wound Measurements Length: (cm) 0.3 Width: (cm) 0.3 Depth: (cm) 0.2 Area: (cm) 0.071 Volume: (cm) 0.014 % Reduction in Area: 0% % Reduction in Volume: -100% Epithelialization: None Tunneling: No Undermining:  No Wound Description Full Thickness With Exposed Support Classification: Structures Wound Margin: Flat and Intact Exudate Medium Amount: Exudate Type: Serous Exudate Color: amber Foul Odor After Cleansing: No Slough/Fibrino Yes Wound Bed Granulation Amount: None Present (0%) Exposed Structure Necrotic Amount: Large (67-100%) Fascia Exposed: No Necrotic Quality: Adherent Slough Fat Layer (Subcutaneous Tissue) Exposed: No Tendon Exposed: No Muscle Exposed: No Joint Exposed: No Bone Exposed: No Witzke, Trong E. ([id number]) Periwound Skin Texture Texture Color No Abnormalities Noted: No No Abnormalities Noted: No Callus: No Atrophie Blanche: No Crepitus: No Cyanosis: No Excoriation: No Ecchymosis: No Induration: No Erythema: No Rash: No Hemosiderin Staining: No Scarring: No Mottled: No Pallor: No Moisture Rubor: No No Abnormalities Noted: No Dry / Scaly: No Maceration: No Wound Preparation Ulcer Cleansing: Rinsed/Irrigated with Saline Topical Anesthetic Applied: Other: lidocaine 4%, Electronic Signature(s) Signed: 11/04/2017  5:41:21 PM By: Montey Hora Entered By: Montey Hora on 11/04/2017 08:41:21 Rodocker, [name] ([id number]) -------------------------------------------------------------------------------- Wound Assessment Details Patient Name: [name] Date of Service: 11/04/2017 8:30 AM Medical Record Number: [id number] Patient Account Number: [id number] Date of Birth/Sex: September 14, 1940 (76 y.o. M) Treating RN: Cornell Barman Primary Care Antar Milks: Cyndi Bender Other Clinician: Referring Partick Musselman: Cyndi Bender Treating Lilyian Quayle/Extender: Tito Dine in Treatment: 30 Wound Status Wound Number: 9 Primary Pressure Ulcer Etiology: Wound Location: Left Metatarsal head first Wound Healed - Epithelialized Wounding Event: Gradually Appeared Status: Date Acquired: 05/06/2017 Comorbid Congestive Heart Failure, Coronary Artery Weeks Of Treatment: 24 History: Disease, Hypertension, Myocardial Infarction, Clustered Wound: No Osteoarthritis Photos Photo Uploaded By: Montey Hora on 11/04/2017 09:43:39 Wound Measurements Length: (cm) 0 % R Width: (cm) 0 % R Depth: (cm) 0 Epi Area: (cm) 0 Tu Volume: (cm) 0 Un eduction in Area: 100% eduction in Volume: 100% thelialization: Large (67-100%) nneling: No dermining: No Wound Description Classification: Category/Stage II Wound Margin: Flat and Intact Exudate Amount: None Present Foul Odor After Cleansing: No Slough/Fibrino No Wound Bed Granulation Amount: None Present (0%) Exposed Structure Necrotic Amount: None Present (0%) Fascia Exposed: No Fat Layer (Subcutaneous Tissue) Exposed: No Tendon Exposed: No Muscle Exposed: No Joint Exposed: No Bone Exposed: No Periwound Skin Texture Texture Color No Abnormalities Noted: No No Abnormalities Noted: No Cutshaw, Theodus E. ([id number]) Callus: Yes Atrophie Blanche: No Crepitus: No Cyanosis: No Excoriation: No Ecchymosis: No Induration: No Erythema: No Rash:  No Hemosiderin Staining: No Scarring: No Mottled: No Pallor: No Moisture Rubor: No No Abnormalities Noted: No Dry / Scaly: No Maceration: No Wound Preparation Ulcer Cleansing: Rinsed/Irrigated with Saline Topical Anesthetic Applied: Other: lidocaine 4%, Electronic Signature(s) Signed: 11/05/2017 7:26:08 AM By: Gretta Cool, BSN, RN, CWS, Kim RN, BSN Entered By: Gretta Cool, BSN, RN, CWS, Kim on 11/04/2017 09:19:22 [name] ([id number]) -------------------------------------------------------------------------------- Vitals Details Patient Name: [name] Date of Service: 11/04/2017 8:30 AM Medical Record Number: [id number] Patient Account Number: [id number] Date of Birth/Sex: May 01, 1940 (76 y.o. M) Treating RN: Cornell Barman Primary Care Reonna Finlayson: Cyndi Bender Other Clinician: Referring Resa Rinks: Cyndi Bender Treating Leonette Tischer/Extender: Tito Dine in Treatment: 30 Vital Signs Time Taken: 08:27 Temperature (F): 97.8 Height (in): 68 Pulse (bpm): 74 Source: Stated Respiratory Rate (breaths/min): 16 Weight (lbs): 161 Blood Pressure (mmHg): 96/58 Source: Stated Reference Range: 80 - 120 mg / dl Body Mass Index (BMI): 24.5 Electronic Signature(s) Signed: 11/04/2017 4:40:44 PM By: Lorine Bears RCP, RRT, CHT Entered By: Lorine Bears on 11/04/2017 08:30:50

## 2017-11-10 DIAGNOSIS — Z48817 Encounter for surgical aftercare following surgery on the skin and subcutaneous tissue: Secondary | ICD-10-CM | POA: Diagnosis not present

## 2017-11-10 DIAGNOSIS — I2510 Atherosclerotic heart disease of native coronary artery without angina pectoris: Secondary | ICD-10-CM | POA: Diagnosis not present

## 2017-11-10 DIAGNOSIS — I255 Ischemic cardiomyopathy: Secondary | ICD-10-CM | POA: Diagnosis not present

## 2017-11-10 DIAGNOSIS — L97511 Non-pressure chronic ulcer of other part of right foot limited to breakdown of skin: Secondary | ICD-10-CM | POA: Diagnosis not present

## 2017-11-10 DIAGNOSIS — L97311 Non-pressure chronic ulcer of right ankle limited to breakdown of skin: Secondary | ICD-10-CM | POA: Diagnosis not present

## 2017-11-10 DIAGNOSIS — L84 Corns and callosities: Secondary | ICD-10-CM | POA: Diagnosis not present

## 2017-11-11 DIAGNOSIS — I255 Ischemic cardiomyopathy: Secondary | ICD-10-CM | POA: Diagnosis not present

## 2017-11-11 DIAGNOSIS — C44622 Squamous cell carcinoma of skin of right upper limb, including shoulder: Secondary | ICD-10-CM | POA: Diagnosis not present

## 2017-11-11 DIAGNOSIS — L97519 Non-pressure chronic ulcer of other part of right foot with unspecified severity: Secondary | ICD-10-CM | POA: Diagnosis not present

## 2017-11-11 DIAGNOSIS — L97311 Non-pressure chronic ulcer of right ankle limited to breakdown of skin: Secondary | ICD-10-CM | POA: Diagnosis not present

## 2017-11-11 DIAGNOSIS — L97511 Non-pressure chronic ulcer of other part of right foot limited to breakdown of skin: Secondary | ICD-10-CM | POA: Diagnosis not present

## 2017-11-11 DIAGNOSIS — L570 Actinic keratosis: Secondary | ICD-10-CM | POA: Diagnosis not present

## 2017-11-11 DIAGNOSIS — I2510 Atherosclerotic heart disease of native coronary artery without angina pectoris: Secondary | ICD-10-CM | POA: Diagnosis not present

## 2017-11-11 DIAGNOSIS — L820 Inflamed seborrheic keratosis: Secondary | ICD-10-CM | POA: Diagnosis not present

## 2017-11-11 DIAGNOSIS — L84 Corns and callosities: Secondary | ICD-10-CM | POA: Diagnosis not present

## 2017-11-11 DIAGNOSIS — D485 Neoplasm of uncertain behavior of skin: Secondary | ICD-10-CM | POA: Diagnosis not present

## 2017-11-11 DIAGNOSIS — Z48817 Encounter for surgical aftercare following surgery on the skin and subcutaneous tissue: Secondary | ICD-10-CM | POA: Diagnosis not present

## 2017-11-13 DIAGNOSIS — L97311 Non-pressure chronic ulcer of right ankle limited to breakdown of skin: Secondary | ICD-10-CM | POA: Diagnosis not present

## 2017-11-13 DIAGNOSIS — L97511 Non-pressure chronic ulcer of other part of right foot limited to breakdown of skin: Secondary | ICD-10-CM | POA: Diagnosis not present

## 2017-11-13 DIAGNOSIS — L84 Corns and callosities: Secondary | ICD-10-CM | POA: Diagnosis not present

## 2017-11-13 DIAGNOSIS — Z48817 Encounter for surgical aftercare following surgery on the skin and subcutaneous tissue: Secondary | ICD-10-CM | POA: Diagnosis not present

## 2017-11-13 DIAGNOSIS — I255 Ischemic cardiomyopathy: Secondary | ICD-10-CM | POA: Diagnosis not present

## 2017-11-13 DIAGNOSIS — I2510 Atherosclerotic heart disease of native coronary artery without angina pectoris: Secondary | ICD-10-CM | POA: Diagnosis not present

## 2017-11-16 DIAGNOSIS — L97511 Non-pressure chronic ulcer of other part of right foot limited to breakdown of skin: Secondary | ICD-10-CM | POA: Diagnosis not present

## 2017-11-16 DIAGNOSIS — I2510 Atherosclerotic heart disease of native coronary artery without angina pectoris: Secondary | ICD-10-CM | POA: Diagnosis not present

## 2017-11-16 DIAGNOSIS — L97311 Non-pressure chronic ulcer of right ankle limited to breakdown of skin: Secondary | ICD-10-CM | POA: Diagnosis not present

## 2017-11-16 DIAGNOSIS — Z48817 Encounter for surgical aftercare following surgery on the skin and subcutaneous tissue: Secondary | ICD-10-CM | POA: Diagnosis not present

## 2017-11-16 DIAGNOSIS — L84 Corns and callosities: Secondary | ICD-10-CM | POA: Diagnosis not present

## 2017-11-16 DIAGNOSIS — I255 Ischemic cardiomyopathy: Secondary | ICD-10-CM | POA: Diagnosis not present

## 2017-11-17 DIAGNOSIS — J019 Acute sinusitis, unspecified: Secondary | ICD-10-CM | POA: Diagnosis not present

## 2017-11-17 DIAGNOSIS — E538 Deficiency of other specified B group vitamins: Secondary | ICD-10-CM | POA: Diagnosis not present

## 2017-11-17 DIAGNOSIS — R51 Headache: Secondary | ICD-10-CM | POA: Diagnosis not present

## 2017-11-17 DIAGNOSIS — I509 Heart failure, unspecified: Secondary | ICD-10-CM | POA: Diagnosis not present

## 2017-11-18 ENCOUNTER — Encounter: Payer: Medicare Other | Attending: Internal Medicine | Admitting: Internal Medicine

## 2017-11-18 DIAGNOSIS — L97212 Non-pressure chronic ulcer of right calf with fat layer exposed: Secondary | ICD-10-CM | POA: Diagnosis not present

## 2017-11-18 DIAGNOSIS — I252 Old myocardial infarction: Secondary | ICD-10-CM | POA: Insufficient documentation

## 2017-11-18 DIAGNOSIS — Z79899 Other long term (current) drug therapy: Secondary | ICD-10-CM | POA: Insufficient documentation

## 2017-11-18 DIAGNOSIS — I509 Heart failure, unspecified: Secondary | ICD-10-CM | POA: Insufficient documentation

## 2017-11-18 DIAGNOSIS — L97521 Non-pressure chronic ulcer of other part of left foot limited to breakdown of skin: Secondary | ICD-10-CM | POA: Diagnosis not present

## 2017-11-18 DIAGNOSIS — I255 Ischemic cardiomyopathy: Secondary | ICD-10-CM | POA: Diagnosis not present

## 2017-11-18 DIAGNOSIS — I2510 Atherosclerotic heart disease of native coronary artery without angina pectoris: Secondary | ICD-10-CM | POA: Insufficient documentation

## 2017-11-18 DIAGNOSIS — I890 Lymphedema, not elsewhere classified: Secondary | ICD-10-CM | POA: Insufficient documentation

## 2017-11-18 DIAGNOSIS — I70233 Atherosclerosis of native arteries of right leg with ulceration of ankle: Secondary | ICD-10-CM | POA: Diagnosis not present

## 2017-11-18 DIAGNOSIS — I110 Hypertensive heart disease with heart failure: Secondary | ICD-10-CM | POA: Insufficient documentation

## 2017-11-18 DIAGNOSIS — M069 Rheumatoid arthritis, unspecified: Secondary | ICD-10-CM | POA: Insufficient documentation

## 2017-11-18 DIAGNOSIS — I472 Ventricular tachycardia: Secondary | ICD-10-CM | POA: Insufficient documentation

## 2017-11-18 DIAGNOSIS — L97512 Non-pressure chronic ulcer of other part of right foot with fat layer exposed: Secondary | ICD-10-CM | POA: Diagnosis not present

## 2017-11-18 DIAGNOSIS — L97312 Non-pressure chronic ulcer of right ankle with fat layer exposed: Secondary | ICD-10-CM | POA: Diagnosis not present

## 2017-11-18 DIAGNOSIS — I70235 Atherosclerosis of native arteries of right leg with ulceration of other part of foot: Secondary | ICD-10-CM | POA: Insufficient documentation

## 2017-11-20 DIAGNOSIS — L97511 Non-pressure chronic ulcer of other part of right foot limited to breakdown of skin: Secondary | ICD-10-CM | POA: Diagnosis not present

## 2017-11-20 DIAGNOSIS — I2510 Atherosclerotic heart disease of native coronary artery without angina pectoris: Secondary | ICD-10-CM | POA: Diagnosis not present

## 2017-11-20 DIAGNOSIS — L97311 Non-pressure chronic ulcer of right ankle limited to breakdown of skin: Secondary | ICD-10-CM | POA: Diagnosis not present

## 2017-11-20 DIAGNOSIS — L84 Corns and callosities: Secondary | ICD-10-CM | POA: Diagnosis not present

## 2017-11-20 DIAGNOSIS — I255 Ischemic cardiomyopathy: Secondary | ICD-10-CM | POA: Diagnosis not present

## 2017-11-20 DIAGNOSIS — Z48817 Encounter for surgical aftercare following surgery on the skin and subcutaneous tissue: Secondary | ICD-10-CM | POA: Diagnosis not present

## 2017-11-20 NOTE — Progress Notes (Signed)
DEMARQUEZ, CIOLEK ([id number]) Visit Report for 11/18/2017 Arrival Information Details Patient Name: [name] Date of Service: 11/18/2017 8:30 AM Medical Record Number: [id number] Patient Account Number: [id number] Date of Birth/Sex: 1940-01-19 (76 y.o. M) Treating RN: Cornell Barman Primary Care Vici Novick: Cyndi Bender Other Clinician: Referring Kyron Schlitt: Cyndi Bender Treating Klynn Linnemann/Extender: Tito Dine in Treatment: 95 Visit Information History Since Last Visit Added or deleted any medications: Yes Patient Arrived: Ambulatory Any new allergies or adverse reactions: No Arrival Time: 08:24 Had a fall or experienced change in No Accompanied By: wife activities of daily living that may affect Transfer Assistance: None risk of falls: Patient Identification Verified: Yes Signs or symptoms of abuse/neglect since last visito No Secondary Verification Process Completed: Yes Hospitalized since last visit: No Patient Requires Transmission-Based No Implantable device outside of the clinic excluding No Precautions: cellular tissue based products placed in the center Patient Has Alerts: Yes since last visit: Patient Alerts: R ABI >220 Has Dressing in Place as Prescribed: Yes L ABI >220 Pain Present Now: No Electronic Signature(s) Signed: 11/18/2017 2:42:47 PM By: Lorine Bears RCP, RRT, CHT Entered By: Lorine Bears on 11/18/2017 08:25:37 Penagos, [name] ([id number]) -------------------------------------------------------------------------------- Encounter Discharge Information Details Patient Name: [name] Date of Service: 11/18/2017 8:30 AM Medical Record Number: [id number] Patient Account Number: [id number] Date of Birth/Sex: [birth year] (76 y.o. M) Treating RN: Cornell Barman Primary Care Wajiha Versteeg: Cyndi Bender Other Clinician: Referring Keyshun Elpers: Cyndi Bender Treating Nadege Carriger/Extender: Tito Dine  in Treatment: 12 Encounter Discharge Information Items Post Procedure Vitals Discharge Condition: Stable Temperature (F): 97.7 Ambulatory Status: Ambulatory Pulse (bpm): 75 Discharge Destination: Home Respiratory Rate (breaths/min): 16 Transportation: Private Auto Blood Pressure (mmHg): 92/58 Accompanied By: wife Schedule Follow-up Appointment: Yes Clinical Summary of Care: Electronic Signature(s) Signed: 11/18/2017 5:34:01 PM By: Gretta Cool, BSN, RN, CWS, Kim RN, BSN Entered By: Gretta Cool, BSN, RN, CWS, Kim on 11/18/2017 08:58:20 Abbs, [name] ([id number]) -------------------------------------------------------------------------------- Lower Extremity Assessment Details Patient Name: [name] Date of Service: 11/18/2017 8:30 AM Medical Record Number: [id number] Patient Account Number: [id number] Date of Birth/Sex: [birth year] (76 y.o. M) Treating RN: Montey Hora Primary Care Mark Benecke: Cyndi Bender Other Clinician: Referring Caston Coopersmith: Cyndi Bender Treating Royalty Domagala/Extender: Tito Dine in Treatment: 32 Vascular Assessment Pulses: Dorsalis Pedis Palpable: [Right:Yes] Posterior Tibial Extremity colors, hair growth, and conditions: Extremity Color: [Right:Normal] Hair Growth on Extremity: [Right:No] Temperature of Extremity: [Right:Warm] Capillary Refill: [Right:< 3 seconds] Toe Nail Assessment Left: Right: Thick: Yes Discolored: Yes Deformed: Yes Improper Length and Hygiene: Yes Electronic Signature(s) Signed: 11/18/2017 5:11:22 PM By: Montey Hora Entered By: Montey Hora on 11/18/2017 08:37:32 Wilmeth, [name] ([id number]) -------------------------------------------------------------------------------- Multi Wound Chart Details Patient Name: [name] Date of Service: 11/18/2017 8:30 AM Medical Record Number: [id number] Patient Account Number: [id number] Date of Birth/Sex: 1940-08-12 (76 y.o. M) Treating RN: Cornell Barman Primary Care Taneeka Curtner: Cyndi Bender Other Clinician: Referring Erling Arrazola: Cyndi Bender Treating Andersyn Fragoso/Extender: Tito Dine in Treatment: 32 Vital Signs Height(in): 68 Pulse(bpm): 75 Weight(lbs): 161 Blood Pressure(mmHg): 92/58 Body Mass Index(BMI): 24 Temperature(F): 97.7 Respiratory Rate 16 (breaths/min): Photos: [18:No Photos] [6:No Photos] Wound Location: [18:Right, Proximal, Lateral [name]] [6:Right Malleolus - Lateral] Wounding Event: [18:Blister] [6:Gradually Appeared] Primary Etiology: [18:Trauma, Other] [6:Arterial Insufficiency Ulcer] Comorbid History: [18:N/A] [6:Congestive Heart Failure, Coronary Artery Disease, Hypertension, Myocardial Infarction, Osteoarthritis] Date Acquired: [18:10/12/2017] [6:03/23/2017] Weeks of Treatment: [18:3] [6:32] Wound Status: [18:Healed - Epithelialized] [6:Open] Clustered Wound: [18:Yes] [6:No] Measurements L x W x D [18:0x0x0] [6:0.3x0.2x0.2] (  cm) Area (cm) : [18:0] [6:0.047] Volume (cm) : [18:0] [6:0.009] % Reduction in Area: [18:100.00%] [6:-51.60%] % Reduction in Volume: [18:100.00%] [6:-200.00%] Classification: [18:Full Thickness Without Exposed Support Structures] [6:Full Thickness Without Exposed Support Structures] Exudate Amount: [18:N/A] [6:Medium] Exudate Type: [18:N/A] [6:Serous] Exudate Color: [18:N/A] [6:amber] Wound Margin: [18:N/A] [6:Flat and Intact] Granulation Amount: [18:N/A] [6:None Present (0%)] Necrotic Amount: [18:N/A] [6:Medium (34-66%)] Epithelialization: [18:N/A] [6:None] Debridement: [18:N/A] [6:Debridement - Excisional] Pre-procedure [18:N/A] [6:08:50] Verification/Time Out Taken: Pain Control: [18:N/A] [6:Lidocaine] Tissue Debrided: [18:N/A] [6:Subcutaneous, Slough] Level: [18:N/A] [6:Skin/Subcutaneous Tissue] Debridement Area (sq cm): [18:N/A] [6:0.06] Instrument: [18:N/A] [6:Curette] Bleeding: [18:N/A] [6:Minimum] Hemostasis Achieved: [18:N/A] [6:Pressure] Debridement  Treatment N/A Procedure was tolerated well Procedure was tolerated well Response: Post Debridement N/A 0.3x0.2x0.2 0.3x0.2x0.2 Measurements L x W x D (cm) Post Debridement Volume: N/A 0.009 0.009 (cm) Periwound Skin Texture: No Abnormalities Noted Excoriation: No Excoriation: No Induration: No Induration: No Callus: No Callus: No Crepitus: No Crepitus: No Rash: No Rash: No Scarring: No Scarring: No Periwound Skin Moisture: No Abnormalities Noted Maceration: No Maceration: No Dry/Scaly: No Dry/Scaly: No Periwound Skin Color: No Abnormalities Noted Atrophie Blanche: No Atrophie Blanche: No Cyanosis: No Cyanosis: No Ecchymosis: No Ecchymosis: No Erythema: No Erythema: No Hemosiderin Staining: No Hemosiderin Staining: No Mottled: No Mottled: No Pallor: No Pallor: No Rubor: No Rubor: No Temperature: N/A No Abnormality No Abnormality Tenderness on Palpation: No No No Wound Preparation: N/A Ulcer Cleansing: Ulcer Cleansing: Rinsed/Irrigated with Saline Rinsed/Irrigated with Saline Topical Anesthetic Applied: Topical Anesthetic Applied: Other: lidocaine 4% Other: lidocaine 4% Procedures Performed: N/A Debridement Debridement Treatment Notes Wound #6 (Right, Lateral Malleolus) 1. Cleansed with: Clean wound with Normal Saline 2. Anesthetic Topical Lidocaine 4% cream to wound bed prior to debridement 3. Peri-wound Care: Skin Prep 4. Dressing Applied: Santyl Ointment 5. Secondary Dressing Applied Bordered Foam Dressing Notes Santyl gauze and conform to secure. Wound #8 (Right, Distal, Lateral [name]) Notes Santyl gauze and conform to secure. Electronic Signature(s) Signed: 11/18/2017 5:24:02 PM By: Linton Ham MD [place]ountain, [name] ([id number]) Entered By: Linton Ham on 11/18/2017 09:13:44 Ferryman, [name] ([id number]) -------------------------------------------------------------------------------- Multi-Disciplinary Care Plan Details Patient  Name: [name] Date of Service: 11/18/2017 8:30 AM Medical Record Number: [id number] Patient Account Number: [id number] Date of Birth/Sex: 17-Dec-1940 (76 y.o. M) Treating RN: Cornell Barman Primary Care Jacelynn Hayton: Cyndi Bender Other Clinician: Referring Vollie Aaron: Cyndi Bender Treating Vannak Montenegro/Extender: Tito Dine in Treatment: 61 Active Inactive ` Abuse / Safety / Falls / Self Care Management Nursing Diagnoses: History of Falls Goals: Patient will remain injury free related to falls Date Initiated: 04/08/2017 Target Resolution Date: 05/08/2017 Goal Status: Active Interventions: Assess fall risk on admission and as needed Notes: ` Necrotic Tissue Nursing Diagnoses: Impaired tissue integrity related to necrotic/devitalized tissue Goals: Necrotic/devitalized tissue will be minimized in the wound bed Date Initiated: 11/18/2017 Target Resolution Date: 12/02/2017 Goal Status: Active Interventions: Assess patient pain level pre-, during and post procedure and prior to discharge Treatment Activities: Excisional debridement : 11/18/2017 Notes: ` Orientation to the Wound Care Program Nursing Diagnoses: Knowledge deficit related to the wound healing center program Goals: Patient/caregiver will verbalize understanding of the [facility] Date Initiated: 04/08/2017 Target Resolution Date: 05/08/2017 BRIEN, LOWE ([id number]) Goal Status: Active Interventions: Provide education on orientation to the wound center Notes: ` Soft Tissue Infection Nursing Diagnoses: Impaired tissue integrity Potential for infection: soft tissue Goals: Patient will remain free of wound infection Date Initiated: 04/08/2017 Target Resolution Date: 05/08/2017 Goal Status: Active Interventions: Assess signs  and symptoms of infection every visit Notes: ` Wound/Skin Impairment Nursing Diagnoses: Impaired tissue integrity Goals: Ulcer/skin breakdown will heal  within 14 weeks Date Initiated: 04/08/2017 Target Resolution Date: 07/20/2017 Goal Status: Active Interventions: Assess patient/caregiver ability to perform ulcer/skin care regimen upon admission and as needed Provide education on ulcer and skin care Treatment Activities: Topical wound management initiated : 04/08/2017 Notes: Electronic Signature(s) Signed: 11/18/2017 5:34:01 PM By: Gretta Cool, BSN, RN, CWS, Kim RN, BSN Entered By: Gretta Cool, BSN, RN, CWS, Kim on 11/18/2017 08:53:41 Paganelli, [name] ([id number]) -------------------------------------------------------------------------------- Pain Assessment Details Patient Name: [name] Date of Service: 11/18/2017 8:30 AM Medical Record Number: [id number] Patient Account Number: [id number] Date of Birth/Sex: [birth year] (76 y.o. M) Treating RN: Cornell Barman Primary Care Shelah Heatley: Cyndi Bender Other Clinician: Referring Annmarie Plemmons: Cyndi Bender Treating Kristjan Derner/Extender: Tito Dine in Treatment: 32 Active Problems Location of Pain Severity and Description of Pain Patient Has Paino No Site Locations Pain Management and Medication Current Pain Management: Electronic Signature(s) Signed: 11/18/2017 2:42:47 PM By: Lorine Bears RCP, RRT, CHT Signed: 11/18/2017 5:34:01 PM By: Gretta Cool, BSN, RN, CWS, Kim RN, BSN Entered By: Lorine Bears on 11/18/2017 08:25:45 Wheeler, [name] ([id number]) -------------------------------------------------------------------------------- Patient/Caregiver Education Details Patient Name: [name] Date of Service: 11/18/2017 8:30 AM Medical Record Number: [id number] Patient Account Number: [id number] Date of Birth/Gender: [birth year] (76 y.o. M) Treating RN: Cornell Barman Primary Care Physician: Cyndi Bender Other Clinician: Referring Physician: Cyndi Bender Treating Physician/Extender: Tito Dine in Treatment: 43 Education  Assessment Education Provided To: Patient Education Topics Provided Wound/Skin Impairment: Handouts: Caring for Your Ulcer, Other: Wound care as prescibed Methods: Demonstration, Explain/Verbal Responses: State content correctly Electronic Signature(s) Signed: 11/18/2017 5:34:01 PM By: Gretta Cool, BSN, RN, CWS, Kim RN, BSN Entered By: Gretta Cool, BSN, RN, CWS, Kim on 11/18/2017 08:57:17 Kau, [name] ([id number]) -------------------------------------------------------------------------------- Wound Assessment Details Patient Name: [name] Date of Service: 11/18/2017 8:30 AM Medical Record Number: [id number] Patient Account Number: [id number] Date of Birth/Sex: [birth year] (76 y.o. M) Treating RN: Montey Hora Primary Care Neeya Prigmore: Cyndi Bender Other Clinician: Referring Lubna Stegeman: Cyndi Bender Treating Melisa Donofrio/Extender: Tito Dine in Treatment: 32 Wound Status Wound Number: 18 Primary Etiology: Trauma, Other Wound Location: Right, Proximal, Lateral [name] Wound Status: Healed - Epithelialized Wounding Event: Blister Date Acquired: 10/12/2017 Weeks Of Treatment: 3 Clustered Wound: Yes Photos Photo Uploaded By: Montey Hora on 11/18/2017 14:53:11 Wound Measurements Length: (cm) 0 Width: (cm) 0 Depth: (cm) 0 Area: (cm) 0 Volume: (cm) 0 % Reduction in Area: 100% % Reduction in Volume: 100% Wound Description Full Thickness Without Exposed Support Classification: Structures Periwound Skin Texture Texture Color No Abnormalities Noted: No No Abnormalities Noted: No Moisture No Abnormalities Noted: No Electronic Signature(s) Signed: 11/18/2017 5:11:22 PM By: Montey Hora Entered By: Montey Hora on 11/18/2017 08:36:32 Suazo, [name] ([id number]) -------------------------------------------------------------------------------- Wound Assessment Details Patient Name: [name] Date of Service: 11/18/2017 8:30 AM Medical Record  Number: [id number] Patient Account Number: [id number] Date of Birth/Sex: 01-26-1940 (76 y.o. M) Treating RN: Montey Hora Primary Care Thaily Hackworth: Cyndi Bender Other Clinician: Referring Brytni Dray: Cyndi Bender Treating Kiri Hinderliter/Extender: Tito Dine in Treatment: 32 Wound Status Wound Number: 6 Primary Arterial Insufficiency Ulcer Etiology: Wound Location: Right Malleolus - Lateral Wound Open Wounding Event: Gradually Appeared Status: Date Acquired: 03/23/2017 Comorbid Congestive Heart Failure, Coronary Artery Weeks Of Treatment: 32 History: Disease, Hypertension, Myocardial Infarction, Clustered Wound: No Osteoarthritis Photos Photo Uploaded By: Montey Hora on 11/18/2017 14:53:12 Wound Measurements Length: (cm)  0.3 Width: (cm) 0.2 Depth: (cm) 0.2 Area: (cm) 0.047 Volume: (cm) 0.009 % Reduction in Area: -51.6% % Reduction in Volume: -200% Epithelialization: None Tunneling: No Undermining: No Wound Description Full Thickness Without Exposed Support Classification: Structures Wound Margin: Flat and Intact Exudate Medium Amount: Exudate Type: Serous Exudate Color: amber Foul Odor After Cleansing: No Slough/Fibrino Yes Wound Bed Granulation Amount: None Present (0%) Exposed Structure Necrotic Amount: Medium (34-66%) Fascia Exposed: No Necrotic Quality: Adherent Slough Fat Layer (Subcutaneous Tissue) Exposed: No Tendon Exposed: No Muscle Exposed: No Joint Exposed: No Bone Exposed: No Popoff, Montford E. ([id number]) Periwound Skin Texture Texture Color No Abnormalities Noted: No No Abnormalities Noted: No Callus: No Atrophie Blanche: No Crepitus: No Cyanosis: No Excoriation: No Ecchymosis: No Induration: No Erythema: No Rash: No Hemosiderin Staining: No Scarring: No Mottled: No Pallor: No Moisture Rubor: No No Abnormalities Noted: No Dry / Scaly: No Temperature / Pain Maceration: No Temperature: No Abnormality Wound  Preparation Ulcer Cleansing: Rinsed/Irrigated with Saline Topical Anesthetic Applied: Other: lidocaine 4%, Treatment Notes Wound #6 (Right, Lateral Malleolus) 1. Cleansed with: Clean wound with Normal Saline 2. Anesthetic Topical Lidocaine 4% cream to wound bed prior to debridement 3. Peri-wound Care: Skin Prep 4. Dressing Applied: Santyl Ointment 5. Secondary Dressing Applied Bordered Foam Dressing Notes Santyl gauze and conform to secure. Electronic Signature(s) Signed: 11/18/2017 5:11:22 PM By: Montey Hora Entered By: Montey Hora on 11/18/2017 08:36:54 Tabak, [name] ([id number]) -------------------------------------------------------------------------------- Wound Assessment Details Patient Name: [name] Date of Service: 11/18/2017 8:30 AM Medical Record Number: [id number] Patient Account Number: [id number] Date of Birth/Sex: 1940/10/13 (76 y.o. M) Treating RN: Montey Hora Primary Care Joshua Zeringue: Cyndi Bender Other Clinician: Referring Eddis Pingleton: Cyndi Bender Treating Takashi Korol/Extender: Tito Dine in Treatment: 32 Wound Status Wound Number: 8 Primary Arterial Insufficiency Ulcer Etiology: Wound Location: Right [name] - Lateral, Distal Wound Open Wounding Event: Gradually Appeared Status: Date Acquired: 04/29/2017 Comorbid Congestive Heart Failure, Coronary Artery Weeks Of Treatment: 29 History: Disease, Hypertension, Myocardial Infarction, Clustered Wound: No Osteoarthritis Photos Photo Uploaded By: Montey Hora on 11/18/2017 14:53:28 Wound Measurements Length: (cm) 0.3 Width: (cm) 0.2 Depth: (cm) 0.2 Area: (cm) 0.047 Volume: (cm) 0.009 % Reduction in Area: 33.8% % Reduction in Volume: -28.6% Epithelialization: None Tunneling: No Undermining: No Wound Description Full Thickness With Exposed Support Classification: Structures Wound Margin: Flat and Intact Exudate Medium Amount: Exudate Type: Serous Exudate  Color: amber Foul Odor After Cleansing: No Slough/Fibrino Yes Wound Bed Granulation Amount: None Present (0%) Exposed Structure Necrotic Amount: Large (67-100%) Fascia Exposed: No Necrotic Quality: Adherent Slough Fat Layer (Subcutaneous Tissue) Exposed: No Tendon Exposed: No Muscle Exposed: No Joint Exposed: No Bone Exposed: No Forstner, Calob E. ([id number]) Periwound Skin Texture Texture Color No Abnormalities Noted: No No Abnormalities Noted: No Callus: No Atrophie Blanche: No Crepitus: No Cyanosis: No Excoriation: No Ecchymosis: No Induration: No Erythema: No Rash: No Hemosiderin Staining: No Scarring: No Mottled: No Pallor: No Moisture Rubor: No No Abnormalities Noted: No Dry / Scaly: No Temperature / Pain Maceration: No Temperature: No Abnormality Wound Preparation Ulcer Cleansing: Rinsed/Irrigated with Saline Topical Anesthetic Applied: Other: lidocaine 4%, Treatment Notes Wound #8 (Right, Distal, Lateral [name]) Notes Santyl gauze and conform to secure. Electronic Signature(s) Signed: 11/18/2017 5:11:22 PM By: Montey Hora Entered By: Montey Hora on 11/18/2017 08:37:09 [place], [name] ([id number]) -------------------------------------------------------------------------------- [place] Details Patient Name: [name] Date of Service: 11/18/2017 8:30 AM Medical Record Number: [id number] Patient Account Number: [id number] Date of Birth/Sex: June 07, 1940 (76 y.o. M) Treating RN:  Cornell Barman Primary Care Iviona Hole: Cyndi Bender Other Clinician: Referring Lorielle Boehning: Cyndi Bender Treating Joselynn Amoroso/Extender: Tito Dine in Treatment: 32 Vital Signs Time Taken: 08:25 Temperature (F): 97.7 Height (in): 68 Pulse (bpm): 75 Weight (lbs): 161 Respiratory Rate (breaths/min): 16 Body Mass Index (BMI): 24.5 Blood Pressure (mmHg): 92/58 Reference Range: 80 - 120 mg / dl Electronic Signature(s) Signed: 11/18/2017 2:42:47 PM By:  Lorine Bears RCP, RRT, CHT Entered By: Becky Sax, Amado Nash on 11/18/2017 08:34:52

## 2017-11-20 NOTE — Progress Notes (Signed)
RAYNE, LOISEAU ([id number]) Visit Report for 11/18/2017 Debridement Details Patient Name: [name], [name] Date of Service: 11/18/2017 8:30 AM Medical Record Number: [id number] Patient Account Number: [id number] Date of Birth/Sex: Aug 27, 1940 (76 y.o. M) Treating RN: Cornell Barman Primary Care Provider: Cyndi Bender Other Clinician: Referring Provider: Cyndi Bender Treating Provider/Extender: Tito Dine in Treatment: 32 Debridement Performed for Wound #6 Right,Lateral Malleolus Assessment: Performed By: Physician Ricard Dillon, MD Debridement Type: Debridement Severity of Tissue Pre Fat layer exposed Debridement: Level of Consciousness (Pre- Awake and Alert procedure): Pre-procedure Verification/Time Yes - 08:50 Out Taken: Start Time: 08:51 Pain Control: Lidocaine Total Area Debrided (L x W): 0.3 (cm) x 0.2 (cm) = 0.06 (cm) Tissue and other material Viable, Non-Viable, Slough, Subcutaneous, Slough debrided: Level: Skin/Subcutaneous Tissue Debridement Description: Excisional Instrument: Curette Bleeding: Minimum Hemostasis Achieved: Pressure End Time: 08:54 Response to Treatment: Procedure was tolerated well Level of Consciousness Awake and Alert (Post-procedure): Post Debridement Measurements of Total Wound Length: (cm) 0.3 Width: (cm) 0.2 Depth: (cm) 0.2 Volume: (cm) 0.009 Character of Wound/Ulcer Post Debridement: Requires Further Debridement Severity of Tissue Post Debridement: Fat layer exposed Post Procedure Diagnosis Same as Pre-procedure Electronic Signature(s) Signed: 11/18/2017 5:24:02 PM By: Linton Ham MD Signed: 11/18/2017 5:34:01 PM By: Gretta Cool, BSN, RN, CWS, Kim RN, BSN Entered By: Linton Ham on 11/18/2017 09:13:56 [place], [name] ([id number]) -------------------------------------------------------------------------------- Debridement Details Patient Name: [name] Date of Service: 11/18/2017 8:30  AM Medical Record Number: [id number] Patient Account Number: [id number] Date of Birth/Sex: [birth year] (76 y.o. M) Treating RN: Cornell Barman Primary Care Provider: Cyndi Bender Other Clinician: Referring Provider: Cyndi Bender Treating Provider/Extender: Tito Dine in Treatment: 32 Debridement Performed for Wound #8 Right,Distal,Lateral [name] Assessment: Performed By: Physician Ricard Dillon, MD Debridement Type: Debridement Severity of Tissue Pre Fat layer exposed Debridement: Level of Consciousness (Pre- Awake and Alert procedure): Pre-procedure Verification/Time Yes - 08:50 Out Taken: Start Time: 08:51 Pain Control: Lidocaine Total Area Debrided (L x W): 0.3 (cm) x 0.2 (cm) = 0.06 (cm) Tissue and other material Viable, Non-Viable, Slough, Subcutaneous, Slough debrided: Level: Skin/Subcutaneous Tissue Debridement Description: Excisional Instrument: Curette Bleeding: Minimum Hemostasis Achieved: Pressure End Time: 08:54 Response to Treatment: Procedure was tolerated well Level of Consciousness Awake and Alert (Post-procedure): Post Debridement Measurements of Total Wound Length: (cm) 0.3 Width: (cm) 0.2 Depth: (cm) 0.2 Volume: (cm) 0.009 Character of Wound/Ulcer Post Debridement: Requires Further Debridement Severity of Tissue Post Debridement: Fat layer exposed Post Procedure Diagnosis Same as Pre-procedure Electronic Signature(s) Signed: 11/18/2017 5:24:02 PM By: Linton Ham MD Signed: 11/18/2017 5:34:01 PM By: Gretta Cool, BSN, RN, CWS, Kim RN, BSN Entered By: Linton Ham on 11/18/2017 09:14:06 [place], [name] ([id number]) -------------------------------------------------------------------------------- HPI Details Patient Name: [name] Date of Service: 11/18/2017 8:30 AM Medical Record Number: [id number] Patient Account Number: [id number] Date of Birth/Sex: [birth year] (76 y.o. M) Treating RN: Cornell Barman Primary Care  Provider: Cyndi Bender Other Clinician: Referring Provider: Cyndi Bender Treating Provider/Extender: Tito Dine in Treatment: 10 History of Present Illness HPI Description: 04/08/17; this is a complex 77 year old man referred here from [facility]. He had been referred there for bilateral lower extremity edema with ulcer formation predominantly on the right calf but also the right [name]. He had been receiving Unna boots bilaterally. The history here is long. He is not a diabetic however ICU looking through late 2018 he was worked up for chronic headaches, elevated inflammatory markers including C-reactive protein and ESR. He went on  to actually have a left temporal artery biopsy wasn't that was negative.he received about 6 weeks of high-dose prednisone 60 mg with improvement in his inflammatory markers. He was admitted to hospital in late November with ventricular tachycardia syncope. He has known ischemic cardiomyopathy. He was admitted in the hospital in mid December. Apparently this was precipitated by a syncopal spell falling out of his scooter while at [employer]. There was ventricular arrhythmia. He has an implantable defibrillator and echocardiogram showed severe LV dysfunction with an EF of 20% and valvular regurgitations including mild AR, moderate MR. There was no stenosis. He ruled in for a non-ST elevation MI in the setting of V. tach.his wife states that sometime during this hospitalization she noted multiple areas of skin change on the right lower calf which became evident just after he left the hospital. He was back in hospital in February with acute renal failure hyponatremia. This responded to fluid resuscitation.. Interestingly I can't see much description of his right leg at that point in time.he was followed by Dr. Nehemiah Massed of dermatology for the necrotic wounds on his right leg. Apparently a biopsy was planned at one point but not done although in  some notes that suggests it was. I cannot see these results area He was noted to have a lot of edema. Was treated with bilateral Unna boots edges really helped with the swelling they have been using Bactroban to small open areas predominantly on the right anterior lower leg His history is complicated by the fact that he has rheumatoid arthritis followed by rheumatology. He is followed by neurology for disabling headaches. At one point this was felt to be giant cell arteritis although a left temporal biopsy was apparently negative. He was given a prolonged course of prednisone at 60 mg which managed his sedimentation rates but apparently did not prove improve the headaches. This is been tapered to off on by rheumatology on 03/13/17 Vascular had plans to do a venous reflux workup as well as arterial studies in May. They also wanted to get him a lymphedema pump. As mentioned he's been using bacitracin under Unna boot wraps to both lower legs 04/15/17; the patient arrives with most of his wounds improved. These are small punched out wounds. Most of them remaining ones are on the right anterior calf with the most problematic over the right lateral malleolus. There are no new areas. The symptom complex or potential symptom complex we are dealing with his chronic disabling headaches with inflammatory markers not responsive to prednisone and with a negative temporal biopsy, lower extremity weakness, skin ulcerations just on the right leg. We have managed to get his arterial studies moved up to April 30. He has a rheumatology consult at [facility] in June. He has seen dermatology locally, rheumatology locally, neurology locally. 04/22/17; small punched out areas on the right leg anteriorly posteriorly. Most of these appear to have closed over. Some of them have eschar over the surface. The most problematic area appears to be over the right lateral malleolus. We've been using prisma to all of this. He has arterial  studies on April 30 and a rheumatology consult at [facility] on June 28 04/29/17; most of the small punched out areas on the right leg posteriorly are closed. He has 2 or 3 openings anteriorly but most of these appear to be on the way to closing. Still problematically over the right lateral malleolus and right lateral [name] with almost ischemic-looking eschar. His arterial studies that I ordered are due  to be done next week on the 30th so we should have them available for our next visit hopefully. He also saw a rheumatologist at [facility] and according to the patient he did 8 vials of blood. Finally he has scaling rash on his left [name] and what looks to be at [facility] area on the left anterior leg 05/06/17; most of the small punched out areas on the right leg posteriorly and anteriorly are closed. He still has one small one anteriorly one over the right lateral malleolus and one over the right lateral [name]. TONI, HOFFMEISTER ([id number]) He had his arterial studies they didn't seem to do waveform analysis not exactly sure why however in any case is ABIs were noncompressible bilaterally. They did provide TBIs although looking at the pressures it appears that his TBIs are quite normal. I therefore went ahead and debrided the area over the right lateral malleolus and the right lateral [name] 05/13/17; most of the small punched out areas on the right leg posteriorly and anteriorly are closed. He continues to have problematic areas over the right lateral malleolus and the right lateral [name]. He still requiring aggressive debridement of these 2 wounds using silver collagen I reviewed the note from rheumatology I don't think they came up with a specific diagnosis although he is known to have seropositive RA. They did a panel of lab work when I was able to see his his AMA was negative, anti-smooth muscle antibodies negative antineutrophil cytoplasmic antibodies negative,Liv/kia type 1 negative. Serum C3 and C4 were negative.  I don't see his muscle enzymes specifically. He was referred back to his local rheumatologist for management of his known rheumatoid arthritis.the patient states he still feels weak and fatigued. He states he has numbness in both feet and apparently is known to have neuropathy 05/20/17; the patient continues to have a difficult problem on the right lateral malleolus and the right lateral [name]. Both of these wounds have no viable surface even with attempts at debridement. He has a new wound on the left plantar metatarsal head which looks more like a superficial diabetic pressure related injury then part of this underlying issue he has. Most of the rest of the wounds on his legs look satisfactory. Mostly on the right calf.reviewed his arterial studies which showed noncompressible vessels bilaterally but the TBIs were quite normal. 05/27/17; no real improvement in the right lateral malleolus and right lateral [name]. In fact the right lateral [name] is now on bone. I gave him doxycycline empirically last week it appears that he developed photosensitivity was 59 the son in a tractor. I'll not give him any more of this. This is predominantly on his face and dorsal forearms and hands. I given this more as an anti- inflammatory. I'm going to have him seen by vascular surgery. His TBIs that he had done previously ordered by Dr. Brigitte Pulse were in the normal range They never did full arterial studies on him. he now has small punched out wounds on the right lateral ankle and right lateral [name]. I doubt these are ischemic however I would like a review of his macrovascular status. He does describe some pain at night. I'll reduce the compression from 3 layer to 2 layer and I'm not convinced that this is a macrovascular issue however I want to make sure. We've been using silver alginate 06/03/17; the patient's x-rays that I ordered last time of his right ankle and right lateral [name] did not show definite  osteomyelitis. We've been using  silver alginate. The wounds are not making any progress. The area on the plantar left first metatarsal head however appears to be better. The patient complains of weakness that is more of the fatigue. He says if he's walking his head will fall onto his chest and that his legs literally gave out on him. He did see neurology in the past however that was at a time where his workup was for temporal arteritis and headaches. 06/10/17; the patient is making no progress with the areas on the right lateral [name] and right lateral malleolus. In fact the area on the [name] probes to bone. X-ray did not show osteomyelitis. He went and saw vein and vascular on 06/04/17 he was felt to have significant reflux in the left greater saphenous vein over this is not in the area we are most concerned about. He could be offered ablation. He was not felt to have venous reflux noted in the right lower extremity. He was felt to have some degree of lymphedema and he was felt to be a candidate for compression pumps. Finally a diagnostic arteriogram was suggested which is really what I'm most interested in. The patient has wife wanted time to think about this, I think they were confused about interacting between venous and arterial discussions. I think it would be probably well worth going through the angiogram. Culture I did have the deeper area on the right lateral [name] showed a few Enterococcus faecalis. I would like to start her on amoxicillin which they will start today for one-week 500 3 times a day 06/17/17; the patient still has punched out areas on the right lateral [name] and right lateral malleolus. There is not a viable surface here. We have been using Santyl. He also has an area on the plantar aspect of his left first metatarsal head this also seems to be better 06/24/17; patient's angiogram as on 07/06/17; still has punched out areas on the right lateral [name] and right lateral malleolus  to which we've been applying Santyl-based dressings. He also has a more superficial area on the left plantar first metatarsal head, using collagen here 07/08/17; the patient had his angiogram. This perineal artery had a 70-80% stenosis. Similarly the posterior tibial artery also was diseased. Furthermore down the posterior tibial artery in the distal segment was a short stenosis of 80%. His major vessels in his thigh and only minor irregularity. He had percutaneous angioplasty of the proximal right peroneal artery. Also angioplasty of the tibial peroneal trunk and proximal posterior tibial artery as well as the mid to distal segment of the posterior tibial artery. He handled this remarkably well. The patient arrived with a new wound on his right anterior calf which I think is the reopening of one of the original small open areas 07/15/17; the areas on his right anterior calf is new. He has a new threatened area on the right tibia more superiorly which is not open yet but makes me wonder whether this is going to reopen as well. His original wounds on the right lateral [name] and right lateral malleolus are somewhat better in terms of surface Chronister, Olive E. ([id number]) 07/22/17; the patient has a second open area on the right anterior. This was a threatened area superiorly last week. Each time this happens she has a small wound with a nephrotic cover. The areas on the right lateral [name] and right lateral malleolus are about the same. I did not attempt debridement in any of these today continuing with Santyl.  oThe area on the left second metatarsal head looks better and he is healed laceration injuries on the arm from a fall last week with only the ventral forearm wound left 08/05/17 08/05/17; 2 week follow-up. This is a patient who initially presented with a multitude of small punched out areas on the right anterior calf greater than left dorsal [name]. This was in the setting of a constitutional  illness at which time he saw multiple specialists was worked up for various rheumatologic diseases including temporal arteritis. Most of the areas on the right leg and a few on the left actually closed over however he he developed 2 small deep probing areas on the right lateral [name] and right lateral malleolus. He also has an area on the left plantar first metatarsal head which is gradually been getting better. He was revascularized by Dr. dew about 4 weeks ago. He went to see Dr. Nehemiah Massed on 07/30/17; from review the note in time to the patient there wasn't an obvious cause. He did do a shave biopsy of the right anterior leg ulcer rule out cancer or other etiologies such as some embolic. This looks like some form of vasculopathy to me although trying to explain why it so much worse on the right leg than the left is been difficult. We will await the biopsy results. Dr. Nehemiah Massed wanted to put Bactroban and this not sure what that will do for the areas that have a completely nonviable surface. I'd like to go back to [place]. 08/19/17-He is here in follow up evaluation for multiple ulcerations to the right lower extremity and right [name] and the left planter first metatarsal head ulcer. He has had biopsy to the right lateral proximal leg; no results available for review, patient and spouse report "negative" biopsy. We will continue with current treatment plan, using ace wrap compression to the right leg as his 15-34mHg compression stockings are providing suboptimal edema control. He will follow up in two weeks 09/02/17; the patient's biopsy done by dermatology/Dr. KNehemiah Massedwas apparently "negative". He has 2 open areas on the right calf one was the biopsy site on the right lateral calf. Most problematic wounds are on the right lateral malleolus and on the right lateral [name]. Both of these have some depth we've been using Santyl here. Also now a small open area remaining on the left plantar [name] 09/16/17 ;  every week follow-up. The patient has been using Santyl to the area on the right lateral ankle and right lateral [name]. All the areas on the right calf including the biopsy site are closed. He still has the area on the first metatarsal head on the left. We've been using collagen to this as well oBiweekly visit. The area on the left first metatarsal head is just about closed. We have a new tape injury on the dorsal left [name] that was caused by her nurse taking off his dressing. This is superficial. oHe still has difficult areas on the right lateral [name] and right lateral malleolus. He been using Endoform. I was hoping we were going to see better results than today. Still required debridement. I'm going to put Oasis through his insurance 10/14/17; the patient arrives today with really not a lot of change in the small punched-out areas on the right lateral [name] and right lateral malleolus. I have been using Endoform. He is also apparently okay for Oasis as he has a good secondary insurance to [employer] Nevertheless the wound bed is not ready for application 160/73/71  the patient arrives with not a lot of change in the small punched out areas on the right lateral [name] and right lateral malleolus. We've been using Endoform. Also this week he arrives with 2 blisters on the right lateral [name]. He thinks this may have been secondary to a Band-Aid that pulled on the skin that was put over this area. He apparently also had unna paste on this area. Finally he has a fine macular rash on the left lower leg very pruritic. The cause of this is not clear he's been using hydrocortisone cream 11/04/17; the patient arrives with better looking wound surfaces on the right lateral [name] and right lateral malleolus. He has a blister on the right lateral [name] there is left a small open area just above the wounds. We have been using Santyl to the major areas. The patient had his angiogram on the left. There was nothing in the  renal arteries aorta or iliac arteries he had diffusely calcific but nonstenotic common femoral arteries, , profunda femoris artery and popliteal arteries. The anterior tibial artery was occluded with poor reconstruction distally. The peroneal artery was patent without focal stenosis and was the dominant runoff vessel. Posterior tibial artery re-constituted at the ankle I believe from collaterals from the peroneal artery. Proximal posterior tibial artery was nearly occluded with a greater than 90% the patient had a angioplasty of the left posterior tibial artery and tibioperoneal trunk. This procedure was on 11/02/17. Since then the patient has developed a petechial rash on the medial left calf and medial dorsal [name] as well as his toes. This is compatible with cholesterol emboli/atheromatous emboli. There is no open area. 11/18/17; the petechial rash after his procedure on the left has resolved. The 2 open areas on the right lateral malleolus and Laprade, [place] E. ([id number]) right lateral [name] are unfortunately about the same. We've been using Santyl. He is approved for Oasis with the surface of this is simply not ready for this Electronic Signature(s) Signed: 11/18/2017 5:24:02 PM By: Linton Ham MD Entered By: Linton Ham on 11/18/2017 09:14:51 [place], [name] ([id number]) -------------------------------------------------------------------------------- Physical Exam Details Patient Name: [name] Date of Service: 11/18/2017 8:30 AM Medical Record Number: [id number] Patient Account Number: [id number] Date of Birth/Sex: January 04, 1941 (76 y.o. M) Treating RN: Cornell Barman Primary Care Provider: Cyndi Bender Other Clinician: Referring Provider: Cyndi Bender Treating Provider/Extender: Tito Dine in Treatment: 25 Constitutional Patient is hypotensive.However appears well. Pulse regular and within target range for patient.[name] Respirations regular, non- labored  and within target range.. Temperature is normal and within the target range for the patient.[name] appears in no distress. Notes Exam oRight lateral malleolus and right lateral [name] ; unfortunately not much change. Both covered in insurance debris which requires removal with a #3 curet. There is bleeding hemostasis with direct pressure. Post debridement the tissue actually looks satisfactory end of eye could maintain this i would be prepared to use Oasis on this to see if we can stimulate the granulation towards closure. Electronic Signature(s) Signed: 11/18/2017 5:24:02 PM By: Linton Ham MD Entered By: Linton Ham on 11/18/2017 09:16:52 Tippy, [name] ([id number]) -------------------------------------------------------------------------------- Physician Orders Details Patient Name: [name] Date of Service: 11/18/2017 8:30 AM Medical Record Number: [id number] Patient Account Number: [id number] Date of Birth/Sex: 1940/12/01 (76 y.o. M) Treating RN: Cornell Barman Primary Care Provider: Cyndi Bender Other Clinician: Referring Provider: Cyndi Bender Treating Provider/Extender: Tito Dine in Treatment: 73 Verbal / Phone Orders: No Diagnosis Coding Wound  Cleansing Wound #6 Right,Lateral Malleolus o Clean wound with Normal Saline. Wound #8 Right,Distal,Lateral [name] o Clean wound with Normal Saline. Anesthetic (add to Medication List) Wound #6 Right,Lateral Malleolus o Topical Lidocaine 4% cream applied to wound bed prior to debridement (In Clinic Only). Wound #8 Right,Distal,Lateral [name] o Topical Lidocaine 4% cream applied to wound bed prior to debridement (In Clinic Only). Primary Wound Dressing Wound #6 Right,Lateral Malleolus o Santyl Ointment Wound #8 Right,Distal,Lateral [name] o Santyl Ointment Secondary Dressing Wound #6 Right,Lateral Malleolus o ABD pad o Conform/Kerlix Wound #8 Right,Distal,Lateral [name] o ABD pad o  Conform/Kerlix Dressing Change Frequency Wound #6 Right,Lateral Malleolus o Change Dressing Monday, Wednesday, Friday Wound #8 Right,Distal,Lateral [name] o Change Dressing Monday, Wednesday, Friday Follow-up Appointments Wound #6 Right,Lateral Malleolus o Return Appointment in 2 weeks. Wound #8 Right,Distal,Lateral [name] o Return Appointment in 2 weeks. ANYELO, MCCUE ([id number]) [place] Wound #6 [place] Visits - Monday, Wednesday and Friday on weeks patient does not come to wound care center. o Home Health Nurse may visit PRN to address patientos wound care needs. o FACE TO FACE ENCOUNTER: MEDICARE and MEDICAID PATIENTS: I certify that this patient is under my care and that I had a face-to-face encounter that meets the physician face-to-face encounter requirements with this patient on this date. The encounter with the patient was in whole or in part for the following MEDICAL CONDITION: (primary reason for [place]) MEDICAL NECESSITY: I certify, that based on my findings, NURSING services are a medically necessary home health service. HOME BOUND STATUS: I certify that my clinical findings support that this patient is homebound (i.e., Due to illness or injury, pt requires aid of supportive devices such as crutches, cane, wheelchairs, walkers, the use of special transportation or the assistance of another person to leave their place of residence. There is a normal inability to leave the home and doing so requires considerable and taxing effort. Other absences are for medical reasons / religious services and are infrequent or of short duration when for other reasons). o If current dressing causes regression in wound condition, may D/C ordered dressing product/s and apply Normal Saline Moist Dressing daily until next [place] / Other MD appointment. [place] of regression in wound condition at  [phone]. o Please direct any NON-WOUND related issues/requests for orders to patient's Primary Care Physician Wound #8 [place] Visits - Monday, Wednesday and Friday on weeks patient does not come to wound care center. o Home Health Nurse may visit PRN to address patientos wound care needs. o FACE TO FACE ENCOUNTER: MEDICARE and MEDICAID PATIENTS: I certify that this patient is under my care and that I had a face-to-face encounter that meets the physician face-to-face encounter requirements with this patient on this date. The encounter with the patient was in whole or in part for the following MEDICAL CONDITION: (primary reason for [place]) MEDICAL NECESSITY: I certify, that based on my findings, NURSING services are a medically necessary home health service. HOME BOUND STATUS: I certify that my clinical findings support that this patient is homebound (i.e., Due to illness or injury, pt requires aid of supportive devices such as crutches, cane, wheelchairs, walkers, the use of special transportation or the assistance of another person to leave their place of residence. There is a normal inability to leave the home and doing so requires considerable and taxing effort. Other absences are for medical reasons / religious services  and are infrequent or of short duration when for other reasons). o If current dressing causes regression in wound condition, may D/C ordered dressing product/s and apply Normal Saline Moist Dressing daily until next [place] / Other MD appointment. [place] of regression in wound condition at [phone]. o Please direct any NON-WOUND related issues/requests for orders to patient's Primary Care Physician Electronic Signature(s) Signed: 11/18/2017 5:24:02 PM By: Linton Ham MD Signed: 11/18/2017 5:34:01 PM By: Gretta Cool, BSN, RN, CWS, Kim RN, BSN Entered By: Gretta Cool, BSN, RN,  CWS, Kim on 11/18/2017 08:56:28 Lackie, [name] ([id number]) -------------------------------------------------------------------------------- Problem List Details Patient Name: [name] Date of Service: 11/18/2017 8:30 AM Medical Record Number: [id number] Patient Account Number: [id number] Date of Birth/Sex: [birth year] (76 y.o. 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 of 07/08/2017 No Yes other part of [name] I25.5 Ischemic cardiomyopathy 04/08/2017 No Yes Inactive Problems ICD-10 Code Description Active Date Inactive Date L97.212 Non-pressure chronic ulcer of right calf with fat layer exposed 04/08/2017 04/08/2017 Resolved Problems ICD-10 Code Description Active Date Resolved Date S41.111D Laceration without foreign body of right upper arm, subsequent 07/15/2017 07/15/2017 encounter Electronic Signature(s) Signed: 11/18/2017 5:24:02 PM By: Linton Ham MD Entered By: Linton Ham on 11/18/2017 09:13:37 Terr[place], [name] ([id number]) -------------------------------------------------------------------------------- Progress Note Details Patient Name: [name] Date of Service: 11/18/2017 8:30 AM Medical Record Number: [id number] Patient Account Number: [id number] Date of Birth/Sex: 06/29/1940 (76 y.o. M) Treating RN: Cornell Barman Primary Care Provider: Cyndi Bender Other Clinician: Referring Provider: Cyndi Bender Treating Provider/Extender: Tito Dine in Treatment: 32 Subjective History of Present Illness (HPI) 04/08/17; this is a complex 77 year old man referred here from [facility]. He had been referred there for bilateral lower extremity edema with ulcer formation predominantly on the right calf but also the right [name]. He had been receiving Unna boots bilaterally. The history here is long. He is not a diabetic however ICU looking through late 2018 he was worked up for chronic headaches, elevated inflammatory markers including C-reactive protein and ESR. He went on to actually have a left temporal artery biopsy wasn't that was negative.he received about 6 weeks of high-dose prednisone 60 mg with improvement in his inflammatory markers. He was admitted to hospital in late November with ventricular tachycardia syncope. He has known ischemic cardiomyopathy. He was admitted in the hospital in mid December. Apparently this was precipitated by a syncopal spell falling out of his scooter while at [place]. There was ventricular arrhythmia. He has an implantable defibrillator and echocardiogram showed severe LV dysfunction with an EF of 20% and valvular regurgitations including mild AR, moderate MR. There was no stenosis. He ruled in for a non-ST elevation MI in the setting of V. tach.his wife states that sometime during this hospitalization she noted multiple areas of skin change on the right lower calf which became evident just after he left the hospital. He was back in hospital in February with acute renal failure hyponatremia. This responded to fluid resuscitation.. Interestingly I can't see much description of his right  leg at that point in time.he was followed by Dr. Nehemiah Massed of dermatology for the necrotic wounds on his right leg. Apparently a biopsy was planned at one point but not done although in some notes that suggests it was. I cannot see these results area He was noted to have a lot of edema. Was treated  with bilateral Unna boots edges really helped with the swelling they have been using Bactroban to small open areas predominantly on the right anterior lower leg His history is complicated by the fact that he has rheumatoid arthritis followed by rheumatology. He is followed by neurology for disabling headaches. At one point this was felt to be giant cell arteritis although a left temporal biopsy was apparently negative. He was given a prolonged course of prednisone at 60 mg which managed his sedimentation rates but apparently did not prove improve the headaches. This is been tapered to off on by rheumatology on 03/13/17 Vascular had plans to do a venous reflux workup as well as arterial studies in May. They also wanted to get him a lymphedema pump. As mentioned he's been using bacitracin under Unna boot wraps to both lower legs 04/15/17; the patient arrives with most of his wounds improved. These are small punched out wounds. Most of them remaining ones are on the right anterior calf with the most problematic over the right lateral malleolus. There are no new areas. The symptom complex or potential symptom complex we are dealing with his chronic disabling headaches with inflammatory markers not responsive to prednisone and with a negative temporal biopsy, lower extremity weakness, skin ulcerations just on the right leg. We have managed to get his arterial studies moved up to April 30. He has a rheumatology consult at [facility] in June. He has seen dermatology locally, rheumatology locally, neurology locally. 04/22/17; small punched out areas on the right leg anteriorly posteriorly. Most of these appear to have closed over. Some of them have eschar over the surface. The most problematic area appears to be over the right lateral malleolus. We've been using prisma to all of this. He has arterial studies on April 30 and a rheumatology consult at [facility] on June 28 04/29/17; most of the small punched out areas on the  right leg posteriorly are closed. He has 2 or 3 openings anteriorly but most of these appear to be on the way to closing. Still problematically over the right lateral malleolus and right lateral [name] with almost ischemic-looking eschar. His arterial studies that I ordered are due to be done next week on the 30th so we should have them available for our next visit hopefully. He also saw a rheumatologist at [facility] and according to the patient he did 8 vials of blood. Finally he has scaling rash on his left [name] and what looks to be at [facility] area on the left anterior leg 05/06/17; most of the small punched out areas on the right leg posteriorly and anteriorly are closed. He still has one small one Berres, Wynter E. ([id number]) anteriorly one over the right lateral malleolus and one over the right lateral [name]. He had his arterial studies they didn't seem to do waveform analysis not exactly sure why however in any case is ABIs were noncompressible bilaterally. They did provide TBIs although looking at the pressures it appears that his TBIs are quite normal. I therefore went ahead and debrided the area over the right lateral malleolus and the right lateral [name] 05/13/17; most of the small punched out areas on  the right leg posteriorly and anteriorly are closed. He continues to have problematic areas over the right lateral malleolus and the right lateral [name]. He still requiring aggressive debridement of these 2 wounds using silver collagen I reviewed the note from rheumatology I don't think they came up with a specific diagnosis although he is known to have seropositive RA. They did a panel of lab work when I was able to see his his AMA was negative, anti-smooth muscle antibodies negative antineutrophil cytoplasmic antibodies negative,Liv/kia type 1 negative. Serum C3 and C4 were negative. I don't see his muscle enzymes specifically. He was referred back to his local rheumatologist for management of his  known rheumatoid arthritis.the patient states he still feels weak and fatigued. He states he has numbness in both feet and apparently is known to have neuropathy 05/20/17; the patient continues to have a difficult problem on the right lateral malleolus and the right lateral [name]. Both of these wounds have no viable surface even with attempts at debridement. He has a new wound on the left plantar metatarsal head which looks more like a superficial diabetic pressure related injury then part of this underlying issue he has. Most of the rest of the wounds on his legs look satisfactory. Mostly on the right calf.reviewed his arterial studies which showed noncompressible vessels bilaterally but the TBIs were quite normal. 05/27/17; no real improvement in the right lateral malleolus and right lateral [name]. In fact the right lateral [name] is now on bone. I gave him doxycycline empirically last week it appears that he developed photosensitivity was 36 the son in a tractor. I'll not give him any more of this. This is predominantly on his face and dorsal forearms and hands. I given this more as an anti- inflammatory. I'm going to have him seen by vascular surgery. His TBIs that he had done previously ordered by Dr. Brigitte Pulse were in the normal range They never did full arterial studies on him. he now has small punched out wounds on the right lateral ankle and right lateral [name]. I doubt these are ischemic however I would like a review of his macrovascular status. He does describe some pain at night. I'll reduce the compression from 3 layer to 2 layer and I'm not convinced that this is a macrovascular issue however I want to make sure. We've been using silver alginate 06/03/17; the patient's x-rays that I ordered last time of his right ankle and right lateral [name] did not show definite osteomyelitis. We've been using silver alginate. The wounds are not making any progress. The area on the plantar left first metatarsal  head however appears to be better. The patient complains of weakness that is more of the fatigue. He says if he's walking his head will fall onto his chest and that his legs literally gave out on him. He did see neurology in the past however that was at a time where his workup was for temporal arteritis and headaches. 06/10/17; the patient is making no progress with the areas on the right lateral [name] and right lateral malleolus. In fact the area on the [name] probes to bone. X-ray did not show osteomyelitis. He went and saw vein and vascular on 06/04/17 he was felt to have significant reflux in the left greater saphenous vein over this is not in the area we are most concerned about. He could be offered ablation. He was not felt to have venous reflux noted in the right lower extremity. He was felt to have  some degree of lymphedema and he was felt to be a candidate for compression pumps. Finally a diagnostic arteriogram was suggested which is really what I'm most interested in. The patient has wife wanted time to think about this, I think they were confused about interacting between venous and arterial discussions. I think it would be probably well worth going through the angiogram. Culture I did have the deeper area on the right lateral [name] showed a few Enterococcus faecalis. I would like to start her on amoxicillin which they will start today for one-week 500 3 times a day 06/17/17; the patient still has punched out areas on the right lateral [name] and right lateral malleolus. There is not a viable surface here. We have been using Santyl. He also has an area on the plantar aspect of his left first metatarsal head this also seems to be better 06/24/17; patient's angiogram as on 07/06/17; still has punched out areas on the right lateral [name] and right lateral malleolus to which we've been applying Santyl-based dressings. He also has a more superficial area on the left plantar first metatarsal head, using  collagen here 07/08/17; the patient had his angiogram. This perineal artery had a 70-80% stenosis. Similarly the posterior tibial artery also was diseased. Furthermore down the posterior tibial artery in the distal segment was a short stenosis of 80%. His major vessels in his thigh and only minor irregularity. He had percutaneous angioplasty of the proximal right peroneal artery. Also angioplasty of the tibial peroneal trunk and proximal posterior tibial artery as well as the mid to distal segment of the posterior tibial artery. He handled this remarkably well. The patient arrived with a new wound on his right anterior calf which I think is the reopening of one of the original small open areas 07/15/17; the areas on his right anterior calf is new. He has a new threatened area on the right tibia more superiorly which is not open yet but makes me wonder whether this is going to reopen as well. His original wounds on the right lateral [name] and Velasques, Okley E. ([id number]) right lateral malleolus are somewhat better in terms of surface 07/22/17; the patient has a second open area on the right anterior. This was a threatened area superiorly last week. Each time this happens she has a small wound with a nephrotic cover. The areas on the right lateral [name] and right lateral malleolus are about the same. I did not attempt debridement in any of these today continuing with Santyl. The area on the left second metatarsal head looks better and he is healed laceration injuries on the arm from a fall last week with only the ventral forearm wound left 08/05/17 08/05/17; 2 week follow-up. This is a patient who initially presented with a multitude of small punched out areas on the right anterior calf greater than left dorsal [name]. This was in the setting of a constitutional illness at which time he saw multiple specialists was worked up for various rheumatologic diseases including temporal arteritis. Most of the areas  on the right leg and a few on the left actually closed over however he he developed 2 small deep probing areas on the right lateral [name] and right lateral malleolus. He also has an area on the left plantar first metatarsal head which is gradually been getting better. He was revascularized by Dr. dew about 4 weeks ago. He went to see Dr. Nehemiah Massed on 07/30/17; from review the note in time to the patient there  wasn't an obvious cause. He did do a shave biopsy of the right anterior leg ulcer rule out cancer or other etiologies such as some embolic. This looks like some form of vasculopathy to me although trying to explain why it so much worse on the right leg than the left is been difficult. We will await the biopsy results. Dr. Nehemiah Massed wanted to put Bactroban and this not sure what that will do for the areas that have a completely nonviable surface. I'd like to go back to [place]. 08/19/17-He is here in follow up evaluation for multiple ulcerations to the right lower extremity and right [name] and the left planter first metatarsal head ulcer. He has had biopsy to the right lateral proximal leg; no results available for review, patient and spouse report "negative" biopsy. We will continue with current treatment plan, using ace wrap compression to the right leg as his 15-11mHg compression stockings are providing suboptimal edema control. He will follow up in two weeks 09/02/17; the patient's biopsy done by dermatology/Dr. KNehemiah Massedwas apparently "negative". He has 2 open areas on the right calf one was the biopsy site on the right lateral calf. Most problematic wounds are on the right lateral malleolus and on the right lateral [name]. Both of these have some depth we've been using Santyl here. Also now a small open area remaining on the left plantar [name] 09/16/17 ; every week follow-up. The patient has been using Santyl to the area on the right lateral ankle and right lateral [name]. All the areas on the right  calf including the biopsy site are closed. He still has the area on the first metatarsal head on the left. We've been using collagen to this as well Biweekly visit. The area on the left first metatarsal head is just about closed. We have a new tape injury on the dorsal left [name] that was caused by her nurse taking off his dressing. This is superficial. He still has difficult areas on the right lateral [name] and right lateral malleolus. He been using Endoform. I was hoping we were going to see better results than today. Still required debridement. I'm going to put Oasis through his insurance 10/14/17; the patient arrives today with really not a lot of change in the small punched-out areas on the right lateral [name] and right lateral malleolus. I have been using Endoform. He is also apparently okay for Oasis as he has a good secondary insurance to [employer] Nevertheless the wound bed is not ready for application 108/81/10 the patient arrives with not a lot of change in the small punched out areas on the right lateral [name] and right lateral malleolus. We've been using Endoform. Also this week he arrives with 2 blisters on the right lateral [name]. He thinks this may have been secondary to a Band-Aid that pulled on the skin that was put over this area. He apparently also had unna paste on this area. Finally he has a fine macular rash on the left lower leg very pruritic. The cause of this is not clear he's been using hydrocortisone cream 11/04/17; the patient arrives with better looking wound surfaces on the right lateral [name] and right lateral malleolus. He has a blister on the right lateral [name] there is left a small open area just above the wounds. We have been using Santyl to the major areas. The patient had his angiogram on the left. There was nothing in the renal arteries aorta or iliac arteries he had diffusely calcific but nonstenotic  common femoral arteries, , profunda femoris artery and popliteal  arteries. The anterior tibial artery was occluded with poor reconstruction distally. The peroneal artery was patent without focal stenosis and was the dominant runoff vessel. Posterior tibial artery re-constituted at the ankle I believe from collaterals from the peroneal artery. Proximal posterior tibial artery was nearly occluded with a greater than 90% the patient had a angioplasty of the left posterior tibial artery and tibioperoneal trunk. This procedure was on 11/02/17. Since then the patient has developed a petechial rash on the medial left calf and medial dorsal [name] as well as his toes. This is compatible with cholesterol emboli/atheromatous emboli. There is no open area. KARMA, HINEY ([id number]) 11/18/17; the petechial rash after his procedure on the left has resolved. The 2 open areas on the right lateral malleolus and right lateral [name] are unfortunately about the same. We've been using Santyl. He is approved for Oasis with the surface of this is simply not ready for this Objective Constitutional Patient is hypotensive.However appears well. Pulse regular and within target range for patient.[name] Respirations regular, non- labored and within target range.. Temperature is normal and within the target range for the patient.[name] appears in no distress. Vitals Time Taken: 8:25 AM, Height: 68 in, Weight: 161 lbs, BMI: 24.5, Temperature: 97.7 F, Pulse: 75 bpm, Respiratory Rate: 16 breaths/min, Blood Pressure: 92/58 mmHg. General Notes: Exam Right lateral malleolus and right lateral [name] ; unfortunately not much change. Both covered in insurance debris which requires removal with a #3 curet. There is bleeding hemostasis with direct pressure. Post debridement the tissue actually looks satisfactory end of eye could maintain this i would be prepared to use Oasis on this to see if we can stimulate the granulation towards closure. Integumentary (Hair, Skin) Wound #18 status is Healed -  Epithelialized. Original cause of wound was Blister. The wound is located on the Right,Proximal,Lateral [name]. The wound measures 0cm length x 0cm width x 0cm depth; 0cm^2 area and 0cm^3 volume. Wound #6 status is Open. Original cause of wound was Gradually Appeared. The wound is located on the Right,Lateral Malleolus. The wound measures 0.3cm length x 0.2cm width x 0.2cm depth; 0.047cm^2 area and 0.009cm^3 volume. There is no tunneling or undermining noted. There is a medium amount of serous drainage noted. The wound margin is flat and intact. There is no granulation within the wound bed. There is a medium (34-66%) amount of necrotic tissue within the wound bed including Adherent Slough. The periwound skin appearance did not exhibit: Callus, Crepitus, Excoriation, Induration, Rash, Scarring, Dry/Scaly, Maceration, Atrophie Blanche, Cyanosis, Ecchymosis, Hemosiderin Staining, Mottled, Pallor, Rubor, Erythema. Periwound temperature was noted as No Abnormality. Wound #8 status is Open. Original cause of wound was Gradually Appeared. The wound is located on the Right,Distal,Lateral [name]. The wound measures 0.3cm length x 0.2cm width x 0.2cm depth; 0.047cm^2 area and 0.009cm^3 volume. There is no tunneling or undermining noted. There is a medium amount of serous drainage noted. The wound margin is flat and intact. There is no granulation within the wound bed. There is a large (67-100%) amount of necrotic tissue within the wound bed including Adherent Slough. The periwound skin appearance did not exhibit: Callus, Crepitus, Excoriation, Induration, Rash, Scarring, Dry/Scaly, Maceration, Atrophie Blanche, Cyanosis, Ecchymosis, Hemosiderin Staining, Mottled, Pallor, Rubor, Erythema. Periwound temperature was noted as No Abnormality. 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 There was a Excisional Skin/Subcutaneous Tissue Debridement with a total area of 0.06 sq cm performed by Ricard Dillon, MD. With the following instrument(s): Curette to remove Viable and Non-Viable tissue/material. Material removed includes Subcutaneous Tissue and Slough and after achieving pain control using Lidocaine. No specimens were taken. A time out was conducted at 08:50, prior to the start of the procedure. A Minimum amount of bleeding was controlled with Pressure. The procedure was tolerated well. Post Debridement Measurements: 0.3cm length x 0.2cm width x 0.2cm depth; 0.009cm^3 volume. Character of Wound/Ulcer Post Debridement requires further debridement. Severity of Tissue Post Debridement is: Fat layer exposed. Post procedure Diagnosis Wound #6: Same as Pre-Procedure Wound #8 Pre-procedure diagnosis of Wound #8 is an Arterial Insufficiency Ulcer located on the Right,Distal,Lateral [name] .Severity of Tissue Pre Debridement is: Fat layer exposed. There was a Excisional Skin/Subcutaneous Tissue Debridement with a total area of 0.06 sq cm performed by Ricard Dillon, MD. With the following instrument(s): Curette to remove Viable and Non-Viable tissue/material. Material removed includes Subcutaneous Tissue and Slough and after achieving pain control using Lidocaine. No specimens were taken. A time out was conducted at 08:50, prior to the start of the procedure. A Minimum amount of bleeding was controlled with Pressure. The procedure was tolerated well. Post Debridement Measurements: 0.3cm length x 0.2cm width x 0.2cm depth; 0.009cm^3  volume. Character of Wound/Ulcer Post Debridement requires further debridement. Severity of Tissue Post Debridement is: Fat layer exposed. Post procedure Diagnosis Wound #8: Same as Pre-Procedure Plan Wound Cleansing: Wound #6 Right,Lateral Malleolus: Clean wound with Normal Saline. Wound #8 Right,Distal,Lateral [name]: Clean wound with Normal Saline. Anesthetic (add to Medication List): Wound #6 Right,Lateral Malleolus: Topical Lidocaine 4% cream applied to wound bed prior to debridement (In Clinic Only). Wound #8 Right,Distal,Lateral [name]: Topical Lidocaine 4% cream applied to wound bed prior to debridement (In Clinic Only). Primary Wound Dressing: Wound #6 Right,Lateral Malleolus: Santyl Ointment Wound #8 Right,Distal,Lateral [name]: Santyl Ointment Secondary Dressing: ARAFAT, COCUZZA ([id number]) Wound #6 Right,Lateral Malleolus: ABD pad Conform/Kerlix Wound #8 Right,Distal,Lateral [name]: ABD pad Conform/Kerlix Dressing Change Frequency: Wound #6 Right,Lateral Malleolus: Change Dressing Monday, Wednesday, Friday Wound #8 Right,Distal,Lateral [name]: Change Dressing Monday, Wednesday, Friday Follow-up Appointments: Wound #6 Right,Lateral Malleolus: Return Appointment in 2 weeks. Wound #8 Right,Distal,Lateral [name]: Return Appointment in 2 weeks. Home Health: Wound #6 Right,Lateral Malleolus: [place] Visits - Monday, Wednesday and Friday on weeks patient does not come to wound care center. Home Health Nurse may visit PRN to address patient s wound care needs. FACE TO FACE ENCOUNTER: MEDICARE and MEDICAID PATIENTS: I certify that this patient is under my care and that I had a face-to-face encounter that meets the physician face-to-face encounter requirements with this patient on this date. The encounter with the patient was in whole or in part for the following MEDICAL CONDITION: (primary reason for [place]) MEDICAL NECESSITY: I certify, that based on my  findings, NURSING services are a medically necessary home health service. HOME BOUND STATUS: I certify that my clinical findings support that this patient is homebound (i.e., Due to illness or injury, pt requires aid of supportive devices such as crutches, cane, wheelchairs, walkers, the use of special  transportation or the assistance of another person to leave their place of residence. There is a normal inability to leave the home and doing so requires considerable and taxing effort. Other absences are for medical reasons / religious services and are infrequent or of short duration when for other reasons). If current dressing causes regression in wound condition, may D/C ordered dressing product/s and apply Normal Saline Moist Dressing daily until next [place] / Other MD appointment. [place] of regression in wound condition at [phone]. Please direct any NON-WOUND related issues/requests for orders to patient's Primary Care Physician Wound #8 Right,Distal,Lateral [name]: [place] Visits - Monday, Wednesday and Friday on weeks patient does not come to wound care center. Home Health Nurse may visit PRN to address patient s wound care needs. FACE TO FACE ENCOUNTER: MEDICARE and MEDICAID PATIENTS: I certify that this patient is under my care and that I had a face-to-face encounter that meets the physician face-to-face encounter requirements with this patient on this date. The encounter with the patient was in whole or in part for the following MEDICAL CONDITION: (primary reason for [place]) MEDICAL NECESSITY: I certify, that based on my findings, NURSING services are a medically necessary home health service. HOME BOUND STATUS: I certify that my clinical findings support that this patient is homebound (i.e., Due to illness or injury, pt requires aid of supportive devices such as crutches, cane, wheelchairs, walkers, the use of  special transportation or the assistance of another person to leave their place of residence. There is a normal inability to leave the home and doing so requires considerable and taxing effort. Other absences are for medical reasons / religious services and are infrequent or of short duration when for other reasons). If current dressing causes regression in wound condition, may D/C ordered dressing product/s and apply Normal Saline Moist Dressing daily until next [place] / Other MD appointment. [place] of regression in wound condition at [phone]. Please direct any NON-WOUND related issues/requests for orders to patient's Primary Care Physician #1 continue with Santyl. At this point I don't see a wonderful option other than medihoney. These are small wounds that required debridement Electronic Signature(s) Signed: 11/18/2017 5:24:02 PM By: Linton Ham MD [place], [name] ([id number]) Entered By: Linton Ham on 11/18/2017 09:17:50 Monacelli, [name] ([id number]) -------------------------------------------------------------------------------- SuperBill Details Patient Name: [name] Date of Service: 11/18/2017 Medical Record Number: [id number] Patient Account Number: [id number] Date of Birth/Sex: 1940/06/10 (76 y.o. 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 I25.5 Ischemic cardiomyopathy Facility Procedures CPT4 Code: 11941740 Description: 81448 - DEB SUBQ TISSUE 20 SQ CM/<  ICD-10 Diagnosis Description L97.512 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 with fat l Modifier: ayer exposed Quantity: 1 Physician Procedures CPT4 Code: 1856314 Description: 11042 - WC PHYS SUBQ TISS 20 SQ CM ICD-10 Diagnosis Description L97.512 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 with fat l Modifier: ayer exposed Quantity: 1 Electronic Signature(s) Signed: 11/18/2017 5:24:02 PM By: Linton Ham MD Entered By: Linton Ham on 11/18/2017 09:18:36

## 2017-11-23 DIAGNOSIS — Z48817 Encounter for surgical aftercare following surgery on the skin and subcutaneous tissue: Secondary | ICD-10-CM | POA: Diagnosis not present

## 2017-11-23 DIAGNOSIS — I255 Ischemic cardiomyopathy: Secondary | ICD-10-CM | POA: Diagnosis not present

## 2017-11-23 DIAGNOSIS — L84 Corns and callosities: Secondary | ICD-10-CM | POA: Diagnosis not present

## 2017-11-23 DIAGNOSIS — L97311 Non-pressure chronic ulcer of right ankle limited to breakdown of skin: Secondary | ICD-10-CM | POA: Diagnosis not present

## 2017-11-23 DIAGNOSIS — I2510 Atherosclerotic heart disease of native coronary artery without angina pectoris: Secondary | ICD-10-CM | POA: Diagnosis not present

## 2017-11-23 DIAGNOSIS — L97511 Non-pressure chronic ulcer of other part of right foot limited to breakdown of skin: Secondary | ICD-10-CM | POA: Diagnosis not present

## 2017-11-25 DIAGNOSIS — Z48817 Encounter for surgical aftercare following surgery on the skin and subcutaneous tissue: Secondary | ICD-10-CM | POA: Diagnosis not present

## 2017-11-25 DIAGNOSIS — L97311 Non-pressure chronic ulcer of right ankle limited to breakdown of skin: Secondary | ICD-10-CM | POA: Diagnosis not present

## 2017-11-25 DIAGNOSIS — L84 Corns and callosities: Secondary | ICD-10-CM | POA: Diagnosis not present

## 2017-11-25 DIAGNOSIS — I2510 Atherosclerotic heart disease of native coronary artery without angina pectoris: Secondary | ICD-10-CM | POA: Diagnosis not present

## 2017-11-25 DIAGNOSIS — I255 Ischemic cardiomyopathy: Secondary | ICD-10-CM | POA: Diagnosis not present

## 2017-11-25 DIAGNOSIS — L97511 Non-pressure chronic ulcer of other part of right foot limited to breakdown of skin: Secondary | ICD-10-CM | POA: Diagnosis not present

## 2017-11-26 DIAGNOSIS — M059 Rheumatoid arthritis with rheumatoid factor, unspecified: Secondary | ICD-10-CM | POA: Diagnosis not present

## 2017-11-26 DIAGNOSIS — Z79899 Other long term (current) drug therapy: Secondary | ICD-10-CM | POA: Diagnosis not present

## 2017-11-27 DIAGNOSIS — I255 Ischemic cardiomyopathy: Secondary | ICD-10-CM | POA: Diagnosis not present

## 2017-11-27 DIAGNOSIS — I2510 Atherosclerotic heart disease of native coronary artery without angina pectoris: Secondary | ICD-10-CM | POA: Diagnosis not present

## 2017-11-27 DIAGNOSIS — Z48817 Encounter for surgical aftercare following surgery on the skin and subcutaneous tissue: Secondary | ICD-10-CM | POA: Diagnosis not present

## 2017-11-27 DIAGNOSIS — L97511 Non-pressure chronic ulcer of other part of right foot limited to breakdown of skin: Secondary | ICD-10-CM | POA: Diagnosis not present

## 2017-11-27 DIAGNOSIS — L84 Corns and callosities: Secondary | ICD-10-CM | POA: Diagnosis not present

## 2017-11-27 DIAGNOSIS — L97311 Non-pressure chronic ulcer of right ankle limited to breakdown of skin: Secondary | ICD-10-CM | POA: Diagnosis not present

## 2017-11-30 ENCOUNTER — Other Ambulatory Visit (INDEPENDENT_AMBULATORY_CARE_PROVIDER_SITE_OTHER): Payer: Self-pay | Admitting: Vascular Surgery

## 2017-11-30 DIAGNOSIS — Z9862 Peripheral vascular angioplasty status: Secondary | ICD-10-CM

## 2017-11-30 DIAGNOSIS — I2510 Atherosclerotic heart disease of native coronary artery without angina pectoris: Secondary | ICD-10-CM | POA: Diagnosis not present

## 2017-11-30 DIAGNOSIS — I70212 Atherosclerosis of native arteries of extremities with intermittent claudication, left leg: Secondary | ICD-10-CM

## 2017-11-30 DIAGNOSIS — I255 Ischemic cardiomyopathy: Secondary | ICD-10-CM | POA: Diagnosis not present

## 2017-11-30 DIAGNOSIS — L97511 Non-pressure chronic ulcer of other part of right foot limited to breakdown of skin: Secondary | ICD-10-CM | POA: Diagnosis not present

## 2017-11-30 DIAGNOSIS — Z48817 Encounter for surgical aftercare following surgery on the skin and subcutaneous tissue: Secondary | ICD-10-CM | POA: Diagnosis not present

## 2017-11-30 DIAGNOSIS — L84 Corns and callosities: Secondary | ICD-10-CM | POA: Diagnosis not present

## 2017-11-30 DIAGNOSIS — L97311 Non-pressure chronic ulcer of right ankle limited to breakdown of skin: Secondary | ICD-10-CM | POA: Diagnosis not present

## 2017-12-01 ENCOUNTER — Encounter (INDEPENDENT_AMBULATORY_CARE_PROVIDER_SITE_OTHER): Payer: Self-pay | Admitting: Nurse Practitioner

## 2017-12-01 ENCOUNTER — Ambulatory Visit (INDEPENDENT_AMBULATORY_CARE_PROVIDER_SITE_OTHER): Payer: Medicare Other | Admitting: Nurse Practitioner

## 2017-12-01 ENCOUNTER — Encounter (INDEPENDENT_AMBULATORY_CARE_PROVIDER_SITE_OTHER): Payer: Medicare Other

## 2017-12-01 ENCOUNTER — Ambulatory Visit (INDEPENDENT_AMBULATORY_CARE_PROVIDER_SITE_OTHER): Payer: Medicare Other

## 2017-12-01 VITALS — BP 90/57 | HR 78 | Resp 16 | Ht 68.0 in | Wt 164.6 lb

## 2017-12-01 DIAGNOSIS — Z9862 Peripheral vascular angioplasty status: Secondary | ICD-10-CM | POA: Diagnosis not present

## 2017-12-01 DIAGNOSIS — I70212 Atherosclerosis of native arteries of extremities with intermittent claudication, left leg: Secondary | ICD-10-CM

## 2017-12-01 DIAGNOSIS — I10 Essential (primary) hypertension: Secondary | ICD-10-CM | POA: Diagnosis not present

## 2017-12-01 DIAGNOSIS — E785 Hyperlipidemia, unspecified: Secondary | ICD-10-CM

## 2017-12-01 NOTE — Progress Notes (Signed)
Subjective:    Patient ID: [name], male    DOB: 04/17/1940, 77 y.o.   MRN: [id number] Chief Complaint  Patient presents with  . Follow-up    [facility] 66month follow up    HPI  [name] is a 77 y.o. male is following up post angiogram done on 11/02/2017.  The patient states that following this angiogram he had less relief in his left lower extremity, concerning numbness.  He states that his right lower extremity has wounds that are continuing to heal however they have been progressively getting much better.  Otherwise, the patient denies any claudication or rest pain like symptoms.  He does endorse having a recent systolic heart failure exacerbation which required increased diuretics in order to control his fluid volume.  The patient denies amaurosis fugax or recent TIA symptoms. There are no recent neurological changes noted. The patient denies history of DVT, PE or superficial thrombophlebitis. The patient denies recent episodes of angina or shortness of breath.   Bilateral ABIs are noncompressible, which is consistent with ABIs done on 10/20/2017.  TBI Rt=0.72 and Lt=0.51  (previous TBI's Rt=0.84 and Lt=0.82) The bilateral tibial waveforms were triphasic.  The right toe waveform was normal and strong.  The left toe waveform was weak and nearly flat Past Medical History:  Diagnosis Date  . AICD (automatic cardioverter/defibrillator) present    [employer]  . Arthritis    RA  . Balance problem   . CHF (congestive heart failure) ([place])   . Collagen vascular disease ([place])    RA since 2014  . Complication of anesthesia    slow to wake up  . Coronary artery disease   . Deviated nasal septum   . Dysrhythmia    v tach  . Hearing loss    TINNITUS  . High cholesterol   . Hypothyroidism   . Leg weakness   . Myocardial infarction ([place])    x 2  . Peripheral vascular disease ([place])   . Presence of permanent cardiac pacemaker    WITH DEFIBRILATOR ([employer])   . Seasonal allergies    RHINITIS  . Shortness of breath dyspnea    COUGH  . Sinus headache   . Sinusitis    CHRONIC  . Sleep apnea   . Vertigo    DYSEQUILIBRIUM    Past Surgical History:  Procedure Laterality Date  . CARDIAC ELECTROPHYSIOLOGY STUDY AND ABLATION    . CARDIAC SURGERY    . CATARACT EXTRACTION     BILATERAL  . CORONARY ANGIOPLASTY     STENTS  . CORONARY ARTERY BYPASS GRAFT  1992  . heart pump    . INSERT / REPLACE / REMOVE PACEMAKER    . LOWER EXTREMITY ANGIOGRAPHY Right 07/06/2017   Procedure: LOWER EXTREMITY ANGIOGRAPHY;  Surgeon: Algernon Huxley, MD;  Location: [facility];  Service: Cardiovascular;  Laterality: Right;  . LOWER EXTREMITY ANGIOGRAPHY Left 11/02/2017   Procedure: LOWER EXTREMITY ANGIOGRAPHY;  Surgeon: Algernon Huxley, MD;  Location: [facility];  Service: Cardiovascular;  Laterality: Left;  . SEPTOPLASTY      Social History   Socioeconomic History  . Marital status: Married    Spouse name: [name]  . Number of children: 3  . Years of education: 92  . Highest education level: Not on file  Occupational History    Comment: Retired  Scientific laboratory technician  . Financial resource strain: Not very hard  . Food insecurity:  Worry: Never true    Inability: Never true  . Transportation needs:    Medical: No    Non-medical: No  Tobacco Use  . Smoking status: Never Smoker  . Smokeless tobacco: Never Used  Substance and Sexual Activity  . Alcohol use: No    Alcohol/week: 0.0 standard drinks  . Drug use: No  . Sexual activity: Not on file  Lifestyle  . Physical activity:    Days per week: 0 days    Minutes per session: 0 min  . Stress: To some extent  Relationships  . Social connections:    Talks on phone: More than three times a week    Gets together: More than three times a week    Attends religious service: 1 to 4 times per year    Active member of club or organization: Not on file    Attends meetings of clubs or organizations:  Not on file    Relationship status: Married  . Intimate partner violence:    Fear of current or ex partner: No    Emotionally abused: No    Physically abused: No    Forced sexual activity: No  Other Topics Concern  . Not on file  Social History Narrative   Patient lives at home with his wife [name]).   Retired.   Education 12th grade.   Right handed.   Caffeine one cup daily.    Family History  Problem Relation Age of Onset  . Alzheimer's disease Mother     Allergies  Allergen Reactions  . Bactrim [Sulfamethoxazole-Trimethoprim] Other (See Comments)    Severe dehydration  . Doxycycline Other (See Comments)    sunburn  . Pravastatin Other (See Comments)    Reaction:  Joint pain      Review of Systems   Review of Systems: Negative Unless Checked Constitutional: [] Weight loss  [] Fever  [] Chills Cardiac: [] Chest pain   []  Atrial Fibrillation  [] Palpitations   [] Shortness of breath when laying flat   [] Shortness of breath with exertion. Vascular:  [] Pain in legs with walking   [] Pain in legs with standing  [] History of DVT   [] Phlebitis   [x] Swelling in legs   [] Varicose veins   [x] Non-healing ulcers Pulmonary:   [] Uses home oxygen   [] Productive cough   [] Hemoptysis   [] Wheeze  [] COPD   [] Asthma Neurologic:  [] Dizziness   [] Seizures   [] History of stroke   [] History of TIA  [] Aphasia   [] Vissual changes   [] Weakness or numbness in arm   [x] Weakness or numbness in leg Musculoskeletal:   [] Joint swelling   [] Joint pain   [] Low back pain  []  History of Knee Replacement Hematologic:  [x] Easy bruising  [] Easy bleeding   [] Hypercoagulable state   [] Anemic Gastrointestinal:  [] Diarrhea   [] Vomiting  [] Gastroesophageal reflux/heartburn   [] Difficulty swallowing. Genitourinary:  [] Chronic kidney disease   [] Difficult urination  [] Anuric   [] Blood in urine Skin:  [] Rashes   [] Ulcers  Psychological:  [] History of anxiety   []  History of major depression  []  Memory Difficulties       Objective:   Physical Exam  BP (!) 90/57 (BP Location: Right Arm)   Pulse 78   Resp 16   Ht 5\' 8"  (1.727 m)   Wt 164 lb 9.6 oz (74.7 kg)   BMI 25.03 kg/m   Gen: WD/WN, NAD Head: [place]/AT, No temporalis wasting.  Ear/Nose/Throat: Hearing grossly intact, nares w/o erythema or drainage Eyes: PER, EOMI, sclera nonicteric.  Neck: Supple, no masses.  No JVD.  Pulmonary:  Good air movement, no use of accessory muscles.  Cardiac: RRR Vascular: 2+ pitting edema bilaterally Vessel Right Left  Radial Palpable Palpable  Dorsalis Pedis Palpable Not Palpable  Posterior Tibial Palpable Trace Palpable   Gastrointestinal: soft, non-distended. No guarding/no peritoneal signs.  Musculoskeletal: M/S 5/5 throughout.  No deformity or atrophy.  Neurologic: Pain and light touch intact in extremities.  Symmetrical.  Speech is fluent. Motor exam as listed above. Psychiatric: Judgment intact, Mood & affect appropriate for pt's clinical situation. Dermatologic: No Venous rashes. No Ulcers Noted.  No changes consistent with cellulitis. Lymph : No Cervical lymphadenopathy, no lichenification or skin changes of chronic lymphedema.      Assessment & Plan:   1. Atheroscler of native artery of left leg with intermit claudication ([place]) Bilateral ABIs are noncompressible, which is consistent with ABIs done on 10/20/2017.  TBI Rt=0.72 and Lt=0.51  (previous TBI's Rt=0.84 and Lt=0.82) The bilateral tibial waveforms were triphasic.  The right toe waveform was normal and strong.  The left toe waveform was weak and nearly flat  Recommend:  The patient is status post successful angiogram with intervention.  The patient reports that the claudication symptoms and leg pain is essentially gone.   The patient denies lifestyle limiting changes at this point in time.  The patient has wounds that are nearly healed on his right lower extremity, however very slow.   Patient does have dampened left toe waveforms, which may be  consistent with microvascular disease.  It also may be further exacerbated by his current systolic heart failure.  No further invasive studies, angiography or surgery at this time The patient should continue walking and begin a more formal exercise program.  The patient should continue antiplatelet therapy and aggressive treatment of the lipid abnormalities   The patient should continue wearing graduated compression socks 10-15 mmHg strength to control the mild edema.  Patient should undergo noninvasive studies as ordered. The patient will follow up with me after the studies.   - VAS [place] ABI WITH/WO TBI; Future  2. Essential (primary) hypertension Continue antihypertensive medications as already ordered, these medications have been reviewed and there are no changes at this time.   3. Hyperlipidemia, unspecified hyperlipidemia type Continue statin as ordered and reviewed, no changes at this time    Current Outpatient Medications on File Prior to Visit  Medication Sig Dispense Refill  . acetaminophen (TYLENOL 8 HOUR ARTHRITIS PAIN) 650 MG CR tablet Take 650-1,300 mg by mouth every 8 (eight) hours as needed (pain/headaches.).    [name] amiodarone (PACERONE) 200 MG tablet Take 100 mg by mouth at bedtime.     [name] aspirin EC 81 MG tablet Take 81 mg by mouth daily.    [name] atorvastatin (LIPITOR) 10 MG tablet Take 1 tablet (10 mg total) by mouth daily. 30 tablet 11  . cetirizine (ZYRTEC) 10 MG tablet Take 10 mg by mouth daily.    . Cholecalciferol (VITAMIN D3) 2000 UNITS TABS Take 2,000 Units by mouth daily.     . clopidogrel (PLAVIX) 75 MG tablet Take 1 tablet (75 mg total) by mouth daily. 30 tablet 11  . docusate sodium (COLACE) 100 MG capsule Take 100-200 mg by mouth daily as needed (for constipation.).     [name] doxycycline (VIBRAMYCIN) 100 MG capsule TAKE 1 CAPSULE BY MOUTH TWICE DAILY FOR PNEUMONIA  0  . ENTRESTO 24-26 MG Take 1 tablet by mouth every 12 (twelve) hours.  11  .  furosemide (LASIX) 40  MG tablet Take 20 mg by mouth daily as needed for edema (for fluid retention.).     [name] leflunomide (ARAVA) 10 MG tablet Take 10 mg by mouth daily.  2  . levothyroxine (SYNTHROID, LEVOTHROID) 50 MCG tablet Take 50 mcg by mouth daily before breakfast.     . metoprolol succinate (TOPROL-XL) 25 MG 24 hr tablet Take 25 mg by mouth 2 (two) times daily.     [name] mexiletine (MEXITIL) 150 MG capsule Take 150 mg by mouth every 8 (eight) hours.     . nitroGLYCERIN (NITROSTAT) 0.4 MG SL tablet Place 0.4 mg under the tongue every 5 (five) minutes as needed for chest pain.    . Polyethylene Glycol 3350 (PEG 3350) POWD Take 17 g by mouth daily as needed (for constipation.).     [name] potassium chloride (K-DUR) 10 MEQ tablet Take 10 mEq by mouth daily.     [name] SANTYL ointment Apply 1 application topically 3 (three) times a week. Apply a "nickel thick" amount to wound as directed  1  . traMADol (ULTRAM) 50 MG tablet Take 1 tablet by mouth 3 (three) times daily as needed (for pan.).     [name] vitamin B-12 (CYANOCOBALAMIN) 1000 MCG tablet Take 1,000 mcg by mouth daily.     No current facility-administered medications on file prior to visit.     There are no Patient Instructions on file for this visit. Return in about 3 months (around 03/03/2018).   Kris Hartmann, NP  This note was completed with Sales executive.  Any errors are purely unintentional.

## 2017-12-02 ENCOUNTER — Encounter: Payer: Medicare Other | Admitting: Family Medicine

## 2017-12-02 DIAGNOSIS — I255 Ischemic cardiomyopathy: Secondary | ICD-10-CM | POA: Diagnosis not present

## 2017-12-02 DIAGNOSIS — L97521 Non-pressure chronic ulcer of other part of left foot limited to breakdown of skin: Secondary | ICD-10-CM | POA: Diagnosis not present

## 2017-12-02 DIAGNOSIS — L97212 Non-pressure chronic ulcer of right calf with fat layer exposed: Secondary | ICD-10-CM | POA: Diagnosis not present

## 2017-12-02 DIAGNOSIS — L97512 Non-pressure chronic ulcer of other part of right foot with fat layer exposed: Secondary | ICD-10-CM | POA: Diagnosis not present

## 2017-12-02 DIAGNOSIS — I70233 Atherosclerosis of native arteries of right leg with ulceration of ankle: Secondary | ICD-10-CM | POA: Diagnosis not present

## 2017-12-02 DIAGNOSIS — I70235 Atherosclerosis of native arteries of right leg with ulceration of other part of foot: Secondary | ICD-10-CM | POA: Diagnosis not present

## 2017-12-02 DIAGNOSIS — I890 Lymphedema, not elsewhere classified: Secondary | ICD-10-CM | POA: Diagnosis not present

## 2017-12-02 DIAGNOSIS — L97312 Non-pressure chronic ulcer of right ankle with fat layer exposed: Secondary | ICD-10-CM | POA: Diagnosis not present

## 2017-12-04 DIAGNOSIS — I255 Ischemic cardiomyopathy: Secondary | ICD-10-CM | POA: Diagnosis not present

## 2017-12-04 DIAGNOSIS — L84 Corns and callosities: Secondary | ICD-10-CM | POA: Diagnosis not present

## 2017-12-04 DIAGNOSIS — Z48817 Encounter for surgical aftercare following surgery on the skin and subcutaneous tissue: Secondary | ICD-10-CM | POA: Diagnosis not present

## 2017-12-04 DIAGNOSIS — L97311 Non-pressure chronic ulcer of right ankle limited to breakdown of skin: Secondary | ICD-10-CM | POA: Diagnosis not present

## 2017-12-04 DIAGNOSIS — I2510 Atherosclerotic heart disease of native coronary artery without angina pectoris: Secondary | ICD-10-CM | POA: Diagnosis not present

## 2017-12-04 DIAGNOSIS — L97511 Non-pressure chronic ulcer of other part of right foot limited to breakdown of skin: Secondary | ICD-10-CM | POA: Diagnosis not present

## 2017-12-07 DIAGNOSIS — L97311 Non-pressure chronic ulcer of right ankle limited to breakdown of skin: Secondary | ICD-10-CM | POA: Diagnosis not present

## 2017-12-07 DIAGNOSIS — L97511 Non-pressure chronic ulcer of other part of right foot limited to breakdown of skin: Secondary | ICD-10-CM | POA: Diagnosis not present

## 2017-12-07 DIAGNOSIS — I2510 Atherosclerotic heart disease of native coronary artery without angina pectoris: Secondary | ICD-10-CM | POA: Diagnosis not present

## 2017-12-07 DIAGNOSIS — L84 Corns and callosities: Secondary | ICD-10-CM | POA: Diagnosis not present

## 2017-12-07 DIAGNOSIS — I255 Ischemic cardiomyopathy: Secondary | ICD-10-CM | POA: Diagnosis not present

## 2017-12-07 DIAGNOSIS — Z48817 Encounter for surgical aftercare following surgery on the skin and subcutaneous tissue: Secondary | ICD-10-CM | POA: Diagnosis not present

## 2017-12-07 NOTE — Progress Notes (Signed)
HAMMAD, FINKLER ([id number]) Visit Report for 12/02/2017 Chief Complaint Document Details Patient Name: [name], [name] Date of Service: 12/02/2017 11:00 AM Medical Record Number: [id number] Patient Account Number: [id number] Date of Birth/Sex: 05/21/1940 (76 y.o. M) Treating RN: Cornell Barman Primary Care Provider: Cyndi Bender Other Clinician: Referring Provider: Cyndi Bender Treating Provider/Extender: Oneida Arenas in Treatment: 34 Information Obtained from: Patient Chief Complaint RLE and left foot wounds Electronic Signature(s) Signed: 12/06/2017 5:29:42 PM By: Beather Arbour FNP-C Entered By: Beather Arbour on 12/02/2017 12:54:00 Pfarr, [name] ([id number]) -------------------------------------------------------------------------------- Debridement Details Patient Name: [name] Date of Service: 12/02/2017 11:00 AM Medical Record Number: [id number] Patient Account Number: [id number] Date of Birth/Sex: Apr 17, 1940 (76 y.o. M) Treating RN: Cornell Barman Primary Care Provider: Cyndi Bender Other Clinician: Referring Provider: Cyndi Bender Treating Provider/Extender: Oneida Arenas in Treatment: 34 Debridement Performed for Wound #6 Right,Lateral Malleolus Assessment: Performed By: Physician Beather Arbour, FNP Debridement Type: Chemical/Enzymatic/Mechanical Agent Used: Santyl Severity of Tissue Pre Fat layer exposed Debridement: Level of Consciousness (Pre- Awake and Alert procedure): Pre-procedure Verification/Time Yes - 11:39 Out Taken: Start Time: 11:39 Pain Control: Lidocaine Bleeding: None End Time: 11:41 Procedural Pain: 0 Post Procedural Pain: 0 Response to Treatment: Procedure was tolerated well Level of Consciousness Awake and Alert (Post-procedure): Post Debridement Measurements of Total Wound Length: (cm) 0.3 Width: (cm) 0.2 Depth: (cm) 0.2 Volume: (cm) 0.009 Character of Wound/Ulcer Post Debridement:  Requires Further Debridement Severity of Tissue Post Debridement: Fat layer exposed Post Procedure Diagnosis Same as Pre-procedure Electronic Signature(s) Signed: 12/02/2017 4:25:08 PM By: Gretta Cool, BSN, RN, CWS, Kim RN, BSN Signed: 12/06/2017 5:29:42 PM By: Beather Arbour FNP-C Entered By: Gretta Cool BSN, RN, CWS, Kim on 12/02/2017 11:42:20 Faye, [name] ([id number]) -------------------------------------------------------------------------------- Debridement Details Patient Name: [name] Date of Service: 12/02/2017 11:00 AM Medical Record Number: [id number] Patient Account Number: [id number] Date of Birth/Sex: 05/09/1940 (76 y.o. M) Treating RN: Cornell Barman Primary Care Provider: Cyndi Bender Other Clinician: Referring Provider: Cyndi Bender Treating Provider/Extender: Oneida Arenas in Treatment: 34 Debridement Performed for Wound #8 Right,Distal,Lateral Foot Assessment: Performed By: Physician Beather Arbour, FNP Debridement Type: Chemical/Enzymatic/Mechanical Agent Used: Santyl Severity of Tissue Pre Fat layer exposed Debridement: Level of Consciousness (Pre- Awake and Alert procedure): Pre-procedure Verification/Time Yes - 11:39 Out Taken: Start Time: 11:39 Pain Control: Lidocaine Bleeding: None End Time: 11:41 Procedural Pain: 0 Post Procedural Pain: 0 Response to Treatment: Procedure was tolerated well Level of Consciousness Awake and Alert (Post-procedure): Post Debridement Measurements of Total Wound Length: (cm) 0.3 Width: (cm) 0.2 Depth: (cm) 0.2 Volume: (cm) 0.009 Character of Wound/Ulcer Post Debridement: Requires Further Debridement Severity of Tissue Post Debridement: Fat layer exposed Post Procedure Diagnosis Same as Pre-procedure Electronic Signature(s) Signed: 12/02/2017 4:25:08 PM By: Gretta Cool, BSN, RN, CWS, Kim RN, BSN Signed: 12/06/2017 5:29:42 PM By: Beather Arbour FNP-C Entered By: Gretta Cool BSN, RN, CWS, Kim on 12/02/2017  11:42:47 Aube, [name] ([id number]) -------------------------------------------------------------------------------- HPI Details Patient Name: [name] Date of Service: 12/02/2017 11:00 AM Medical Record Number: [id number] Patient Account Number: [id number] Date of Birth/Sex: [birth year] (76 y.o. M) Treating RN: Cornell Barman Primary Care Provider: Cyndi Bender Other Clinician: Referring Provider: Cyndi Bender Treating Provider/Extender: Oneida Arenas in Treatment: 34 History of Present Illness HPI Description: 04/08/17; this is a complex 77 year old man referred here from [facility]. He had been referred there for bilateral lower extremity edema with ulcer formation predominantly on the right calf but also the right foot. He had been receiving  Unna boots bilaterally. The history here is long. He is not a diabetic however ICU looking through late 2018 he was worked up for chronic headaches, elevated inflammatory markers including C-reactive protein and ESR. He went on to actually have a left temporal artery biopsy wasn't that was negative.he received about 6 weeks of high-dose prednisone 60 mg with improvement in his inflammatory markers. He was admitted to hospital in late November with ventricular tachycardia syncope. He has known ischemic cardiomyopathy. He was admitted in the hospital in mid December. Apparently this was precipitated by a syncopal spell falling out of his scooter while at [employer]. There was ventricular arrhythmia. He has an implantable defibrillator and echocardiogram showed severe LV dysfunction with an EF of 20% and valvular regurgitations including mild AR, moderate MR. There was no stenosis. He ruled in for a non-ST elevation MI in the setting of V. tach.his wife states that sometime during this hospitalization she noted multiple areas of skin change on the right lower calf which became evident just after he left the hospital. He  was back in hospital in February with acute renal failure hyponatremia. This responded to fluid resuscitation.. Interestingly I can't see much description of his right leg at that point in time.he was followed by Dr. Nehemiah Massed of dermatology for the necrotic wounds on his right leg. Apparently a biopsy was planned at one point but not done although in some notes that suggests it was. I cannot see these results area He was noted to have a lot of edema. Was treated with bilateral Unna boots edges really helped with the swelling they have been using Bactroban to small open areas predominantly on the right anterior lower leg His history is complicated by the fact that he has rheumatoid arthritis followed by rheumatology. He is followed by neurology for disabling headaches. At one point this was felt to be giant cell arteritis although a left temporal biopsy was apparently negative. He was given a prolonged course of prednisone at 60 mg which managed his sedimentation rates but apparently did not prove improve the headaches. This is been tapered to off on by rheumatology on 03/13/17 Vascular had plans to do a venous reflux workup as well as arterial studies in May. They also wanted to get him a lymphedema pump. As mentioned he's been using bacitracin under Unna boot wraps to both lower legs 04/15/17; the patient arrives with most of his wounds improved. These are small punched out wounds. Most of them remaining ones are on the right anterior calf with the most problematic over the right lateral malleolus. There are no new areas. The symptom complex or potential symptom complex we are dealing with his chronic disabling headaches with inflammatory markers not responsive to prednisone and with a negative temporal biopsy, lower extremity weakness, skin ulcerations just on the right leg. We have managed to get his arterial studies moved up to April 30. He has a rheumatology consult at [facility] in June. He has seen  dermatology locally, rheumatology locally, neurology locally. 04/22/17; small punched out areas on the right leg anteriorly posteriorly. Most of these appear to have closed over. Some of them have eschar over the surface. The most problematic area appears to be over the right lateral malleolus. We've been using prisma to all of this. He has arterial studies on April 30 and a rheumatology consult at [facility] on June 28 04/29/17; most of the small punched out areas on the right leg posteriorly are closed. He has 2 or 3  openings anteriorly but most of these appear to be on the way to closing. Still problematically over the right lateral malleolus and right lateral foot with almost ischemic-looking eschar. His arterial studies that I ordered are due to be done next week on the 30th so we should have them available for our next visit hopefully. He also saw a rheumatologist at [facility] and according to the patient he did 8 vials of blood. Finally he has scaling rash on his left foot and what looks to be at [facility] area on the left anterior leg 05/06/17; most of the small punched out areas on the right leg posteriorly and anteriorly are closed. He still has one small one anteriorly one over the right lateral malleolus and one over the right lateral foot. BAYLEE, MCCORKEL ([id number]) He had his arterial studies they didn't seem to do waveform analysis not exactly sure why however in any case is ABIs were noncompressible bilaterally. They did provide TBIs although looking at the pressures it appears that his TBIs are quite normal. I therefore went ahead and debrided the area over the right lateral malleolus and the right lateral foot 05/13/17; most of the small punched out areas on the right leg posteriorly and anteriorly are closed. He continues to have problematic areas over the right lateral malleolus and the right lateral foot. He still requiring aggressive debridement of these 2 wounds using silver collagen I  reviewed the note from rheumatology I don't think they came up with a specific diagnosis although he is known to have seropositive RA. They did a panel of lab work when I was able to see his his AMA was negative, anti-smooth muscle antibodies negative antineutrophil cytoplasmic antibodies negative,Liv/kia type 1 negative. Serum C3 and C4 were negative. I don't see his muscle enzymes specifically. He was referred back to his local rheumatologist for management of his known rheumatoid arthritis.the patient states he still feels weak and fatigued. He states he has numbness in both feet and apparently is known to have neuropathy 05/20/17; the patient continues to have a difficult problem on the right lateral malleolus and the right lateral foot. Both of these wounds have no viable surface even with attempts at debridement. He has a new wound on the left plantar metatarsal head which looks more like a superficial diabetic pressure related injury then part of this underlying issue he has. Most of the rest of the wounds on his legs look satisfactory. Mostly on the right calf.reviewed his arterial studies which showed noncompressible vessels bilaterally but the TBIs were quite normal. 05/27/17; no real improvement in the right lateral malleolus and right lateral foot. In fact the right lateral foot is now on bone. I gave him doxycycline empirically last week it appears that he developed photosensitivity was 70 the son in a tractor. I'll not give him any more of this. This is predominantly on his face and dorsal forearms and hands. I given this more as an anti- inflammatory. I'm going to have him seen by vascular surgery. His TBIs that he had done previously ordered by Dr. Brigitte Pulse were in the normal range They never did full arterial studies on him. he now has small punched out wounds on the right lateral ankle and right lateral foot. I doubt these are ischemic however I would like a review of his macrovascular  status. He does describe some pain at night. I'll reduce the compression from 3 layer to 2 layer and I'm not convinced that this is a macrovascular  issue however I want to make sure. We've been using silver alginate 06/03/17; the patient's x-rays that I ordered last time of his right ankle and right lateral foot did not show definite osteomyelitis. We've been using silver alginate. The wounds are not making any progress. The area on the plantar left first metatarsal head however appears to be better. The patient complains of weakness that is more of the fatigue. He says if he's walking his head will fall onto his chest and that his legs literally gave out on him. He did see neurology in the past however that was at a time where his workup was for temporal arteritis and headaches. 06/10/17; the patient is making no progress with the areas on the right lateral foot and right lateral malleolus. In fact the area on the foot probes to bone. X-ray did not show osteomyelitis. He went and saw vein and vascular on 06/04/17 he was felt to have significant reflux in the left greater saphenous vein over this is not in the area we are most concerned about. He could be offered ablation. He was not felt to have venous reflux noted in the right lower extremity. He was felt to have some degree of lymphedema and he was felt to be a candidate for compression pumps. Finally a diagnostic arteriogram was suggested which is really what I'm most interested in. The patient has wife wanted time to think about this, I think they were confused about interacting between venous and arterial discussions. I think it would be probably well worth going through the angiogram. Culture I did have the deeper area on the right lateral foot showed a few Enterococcus faecalis. I would like to start her on amoxicillin which they will start today for one-week 500 3 times a day 06/17/17; the patient still has punched out areas on the right  lateral foot and right lateral malleolus. There is not a viable surface here. We have been using Santyl. He also has an area on the plantar aspect of his left first metatarsal head this also seems to be better 06/24/17; patient's angiogram as on 07/06/17; still has punched out areas on the right lateral foot and right lateral malleolus to which we've been applying Santyl-based dressings. He also has a more superficial area on the left plantar first metatarsal head, using collagen here 07/08/17; the patient had his angiogram. This perineal artery had a 70-80% stenosis. Similarly the posterior tibial artery also was diseased. Furthermore down the posterior tibial artery in the distal segment was a short stenosis of 80%. His major vessels in his thigh and only minor irregularity. He had percutaneous angioplasty of the proximal right peroneal artery. Also angioplasty of the tibial peroneal trunk and proximal posterior tibial artery as well as the mid to distal segment of the posterior tibial artery. He handled this remarkably well. The patient arrived with a new wound on his right anterior calf which I think is the reopening of one of the original small open areas 07/15/17; the areas on his right anterior calf is new. He has a new threatened area on the right tibia more superiorly which is not open yet but makes me wonder whether this is going to reopen as well. His original wounds on the right lateral foot and right lateral malleolus are somewhat better in terms of surface Ogas, Coral E. ([id number]) 07/22/17; the patient has a second open area on the right anterior. This was a threatened area superiorly last week. Each time this happens  she has a small wound with a nephrotic cover. The areas on the right lateral foot and right lateral malleolus are about the same. I did not attempt debridement in any of these today continuing with Santyl. oThe area on the left second metatarsal head looks better and he  is healed laceration injuries on the arm from a fall last week with only the ventral forearm wound left 08/05/17 08/05/17; 2 week follow-up. This is a patient who initially presented with a multitude of small punched out areas on the right anterior calf greater than left dorsal foot. This was in the setting of a constitutional illness at which time he saw multiple specialists was worked up for various rheumatologic diseases including temporal arteritis. Most of the areas on the right leg and a few on the left actually closed over however he he developed 2 small deep probing areas on the right lateral foot and right lateral malleolus. He also has an area on the left plantar first metatarsal head which is gradually been getting better. He was revascularized by Dr. dew about 4 weeks ago. He went to see Dr. Nehemiah Massed on 07/30/17; from review the note in time to the patient there wasn't an obvious cause. He did do a shave biopsy of the right anterior leg ulcer rule out cancer or other etiologies such as some embolic. This looks like some form of vasculopathy to me although trying to explain why it so much worse on the right leg than the left is been difficult. We will await the biopsy results. Dr. Nehemiah Massed wanted to put Bactroban and this not sure what that will do for the areas that have a completely nonviable surface. I'd like to go back to [place]. 08/19/17-He is here in follow up evaluation for multiple ulcerations to the right lower extremity and right foot and the left planter first metatarsal head ulcer. He has had biopsy to the right lateral proximal leg; no results available for review, patient and spouse report "negative" biopsy. We will continue with current treatment plan, using ace wrap compression to the right leg as his 15-68mHg compression stockings are providing suboptimal edema control. He will follow up in two weeks 09/02/17; the patient's biopsy done by dermatology/Dr. KNehemiah Massedwas  apparently "negative". He has 2 open areas on the right calf one was the biopsy site on the right lateral calf. Most problematic wounds are on the right lateral malleolus and on the right lateral foot. Both of these have some depth we've been using Santyl here. Also now a small open area remaining on the left plantar foot 09/16/17 ; every week follow-up. The patient has been using Santyl to the area on the right lateral ankle and right lateral foot. All the areas on the right calf including the biopsy site are closed. He still has the area on the first metatarsal head on the left. We've been using collagen to this as well oBiweekly visit. The area on the left first metatarsal head is just about closed. We have a new tape injury on the dorsal left foot that was caused by her nurse taking off his dressing. This is superficial. oHe still has difficult areas on the right lateral foot and right lateral malleolus. He been using Endoform. I was hoping we were going to see better results than today. Still required debridement. I'm going to put Oasis through his insurance 10/14/17; the patient arrives today with really not a lot of change in the small punched-out areas on the right  lateral foot and right lateral malleolus. I have been using Endoform. He is also apparently okay for Oasis as he has a good secondary insurance to [employer]. Nevertheless the wound bed is not ready for application 54/62/70; the patient arrives with not a lot of change in the small punched out areas on the right lateral foot and right lateral malleolus. We've been using Endoform. Also this week he arrives with 2 blisters on the right lateral foot. He thinks this may have been secondary to a Band-Aid that pulled on the skin that was put over this area. He apparently also had unna paste on this area. Finally he has a fine macular rash on the left lower leg very pruritic. The cause of this is not clear he's been using hydrocortisone  cream 11/04/17; the patient arrives with better looking wound surfaces on the right lateral foot and right lateral malleolus. He has a blister on the right lateral foot there is left a small open area just above the wounds. We have been using Santyl to the major areas. The patient had his angiogram on the left. There was nothing in the renal arteries aorta or iliac arteries he had diffusely calcific but nonstenotic common femoral arteries, , profunda femoris artery and popliteal arteries. The anterior tibial artery was occluded with poor reconstruction distally. The peroneal artery was patent without focal stenosis and was the dominant runoff vessel. Posterior tibial artery re-constituted at the ankle I believe from collaterals from the peroneal artery. Proximal posterior tibial artery was nearly occluded with a greater than 90% the patient had a angioplasty of the left posterior tibial artery and tibioperoneal trunk. This procedure was on 11/02/17. Since then the patient has developed a petechial rash on the medial left calf and medial dorsal foot as well as his toes. This is compatible with cholesterol emboli/atheromatous emboli. There is no open area. 11/18/17; the petechial rash after his procedure on the left has resolved. The 2 open areas on the right lateral malleolus and Triggs, Rylee E. ([id number]) right lateral foot are unfortunately about the same. We've been using Santyl. He is approved for Oasis with the surface of this is simply not ready for this 12/02/2017 In today for follow-up management of two open wounds on the right lateral malleolus and right lateral foot to the right foot. Wounds have not been making much progress. Recently had angiogram completed on both legs. Due to poor perfusion, he is not able to have debridement. He does report a decrease in his appetite not really eating much. Denies fever, chills, SOB, or pain. Electronic Signature(s) Signed: 12/06/2017 5:29:42 PM  By: Beather Arbour FNP-C Entered By: Beather Arbour on 12/02/2017 12:59:29 Spilde, [name] ([id number]) -------------------------------------------------------------------------------- Physical Exam Details Patient Name: [name] Date of Service: 12/02/2017 11:00 AM Medical Record Number: [id number] Patient Account Number: [id number] Date of Birth/Sex: April 27, 1940 (76 y.o. M) Treating RN: Cornell Barman Primary Care Provider: Cyndi Bender Other Clinician: Referring Provider: Cyndi Bender Treating Provider/Extender: Oneida Arenas in Treatment: 15 Constitutional appears in no distress. Respiratory Respiratory effort is easy and symmetric bilaterally. Rate is normal at rest and on room air.. Cardiovascular Extremities are free of edema.. Integumentary (Hair, Skin) Right LE wond. Electronic Signature(s) Signed: 12/06/2017 5:29:42 PM By: Beather Arbour FNP-C Entered By: Beather Arbour on 12/02/2017 13:01:03 Auerbach, [name] ([id number]) -------------------------------------------------------------------------------- Physician Orders Details Patient Name: [name] Date of Service: 12/02/2017 11:00 AM Medical Record Number: [id number] Patient Account Number: [id number] Date of Birth/Sex: [birth year] (76  y.o. M) Treating RN: Cornell Barman Primary Care Provider: Cyndi Bender Other Clinician: Referring Provider: Cyndi Bender Treating Provider/Extender: Oneida Arenas in Treatment: 58 Verbal / Phone Orders: No Diagnosis Coding Wound Cleansing Wound #6 Right,Lateral Malleolus o Clean wound with Normal Saline. Wound #8 Right,Distal,Lateral Foot o Clean wound with Normal Saline. Anesthetic (add to Medication List) Wound #6 Right,Lateral Malleolus o Topical Lidocaine 4% cream applied to wound bed prior to debridement (In Clinic Only). Wound #8 Right,Distal,Lateral Foot o Topical Lidocaine 4% cream applied to wound bed prior to debridement  (In Clinic Only). Primary Wound Dressing Wound #6 Right,Lateral Malleolus o Santyl Ointment Wound #8 Right,Distal,Lateral Foot o Santyl Ointment Secondary Dressing Wound #6 Right,Lateral Malleolus o ABD pad o Conform/Kerlix Wound #8 Right,Distal,Lateral Foot o ABD pad o Conform/Kerlix Dressing Change Frequency Wound #6 Right,Lateral Malleolus o Change Dressing Monday, Wednesday, Friday Wound #8 Right,Distal,Lateral Foot o Change Dressing Monday, Wednesday, Friday Follow-up Appointments Wound #6 Right,Lateral Malleolus o Return Appointment in 2 weeks. Wound #8 Right,Distal,Lateral Foot o Return Appointment in 2 weeks. ROSWELL, NDIAYE ([id number]) Edema Control Wound #6 Right,Lateral Malleolus o Elevate legs to the level of the heart and pump ankles as often as possible Wound #8 Right,Distal,Lateral Foot o Elevate legs to the level of the heart and pump ankles as often as possible Home Health Wound #6 [place] Visits - Monday, Wednesday and Friday on weeks patient does not come to wound care center. o Home Health Nurse may visit PRN to address patientos wound care needs. o FACE TO FACE ENCOUNTER: MEDICARE and MEDICAID PATIENTS: I certify that this patient is under my care and that I had a face-to-face encounter that meets the physician face-to-face encounter requirements with this patient on this date. The encounter with the patient was in whole or in part for the following MEDICAL CONDITION: (primary reason for [place]) MEDICAL NECESSITY: I certify, that based on my findings, NURSING services are a medically necessary home health service. HOME BOUND STATUS: I certify that my clinical findings support that this patient is homebound (i.e., Due to illness or injury, pt requires aid of supportive devices such as crutches, cane, wheelchairs, walkers, the use of special transportation or the assistance of  another person to leave their place of residence. There is a normal inability to leave the home and doing so requires considerable and taxing effort. Other absences are for medical reasons / religious services and are infrequent or of short duration when for other reasons). o If current dressing causes regression in wound condition, may D/C ordered dressing product/s and apply Normal Saline Moist Dressing daily until next [place] / Other MD appointment. [place] of regression in wound condition at [phone]. o Please direct any NON-WOUND related issues/requests for orders to patient's Primary Care Physician Wound #8 [place] Visits - Monday, Wednesday and Friday on weeks patient does not come to wound care center. o Home Health Nurse may visit PRN to address patientos wound care needs. o FACE TO FACE ENCOUNTER: MEDICARE and MEDICAID PATIENTS: I certify that this patient is under my care and that I had a face-to-face encounter that meets the physician face-to-face encounter requirements with this patient on this date. The encounter with the patient was in whole or in part for the following MEDICAL CONDITION: (primary reason for [place]) MEDICAL NECESSITY: I certify, that based on my findings, NURSING services are a medically necessary home health service. HOME  BOUND STATUS: I certify that my clinical findings support that this patient is homebound (i.e., Due to illness or injury, pt requires aid of supportive devices such as crutches, cane, wheelchairs, walkers, the use of special transportation or the assistance of another person to leave their place of residence. There is a normal inability to leave the home and doing so requires considerable and taxing effort. Other absences are for medical reasons / religious services and are infrequent or of short duration when for other reasons). o If current  dressing causes regression in wound condition, may D/C ordered dressing product/s and apply Normal Saline Moist Dressing daily until next [place] / Other MD appointment. [place] of regression in wound condition at [phone]. o Please direct any NON-WOUND related issues/requests for orders to patient's Primary Care Physician Medications-please add to medication list. Wound #6 Right,Lateral Malleolus o Santyl Enzymatic Ointment Wound #8 Right,Distal,Lateral Foot o Santyl Enzymatic Ointment Electronic Signature(s) DORRANCE, SELLICK ([id number]) Signed: 12/06/2017 5:29:42 PM By: Beather Arbour FNP-C Entered By: Beather Arbour on 12/02/2017 13:01:30 Chovanec, [name] ([id number]) -------------------------------------------------------------------------------- Problem List Details Patient Name: [name] Date of Service: 12/02/2017 11:00 AM Medical Record Number: [id number] Patient Account Number: [id number] Date of Birth/Sex: [birth year] (76 y.o. M) Treating RN: Cornell Barman Primary Care Provider: Cyndi Bender Other Clinician: Referring Provider: Cyndi Bender Treating Provider/Extender: Oneida Arenas in Treatment: 34 Active Problems ICD-10 Evaluated Encounter Code Description Active Date Today Diagnosis L97.512 Non-pressure chronic ulcer of other part of right foot with fat 04/08/2017 No Yes layer exposed L97.521 Non-pressure chronic ulcer of other part of left foot limited to 04/08/2017 No Yes breakdown of skin 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 of 07/08/2017 No Yes other part of foot I25.5 Ischemic cardiomyopathy 04/08/2017 No Yes Inactive Problems ICD-10 Code Description Active Date Inactive Date L97.212 Non-pressure chronic ulcer of right calf with fat layer exposed 04/08/2017 04/08/2017 Resolved Problems ICD-10 Code Description Active Date Resolved Date S41.111D Laceration without foreign  body of right upper arm, subsequent 07/15/2017 07/15/2017 encounter Electronic Signature(s) Signed: 12/06/2017 5:29:42 PM By: Beather Arbour FNP-C Entered By: Beather Arbour on 12/02/2017 12:53:14 Deblanc, [name] ([id number]) -------------------------------------------------------------------------------- Progress Note Details Patient Name: [name] Date of Service: 12/02/2017 11:00 AM Medical Record Number: [id number] Patient Account Number: [id number] Date of Birth/Sex: 1940-03-13 (76 y.o. M) Treating RN: Cornell Barman Primary Care Provider: Cyndi Bender Other Clinician: Referring Provider: Cyndi Bender Treating Provider/Extender: Oneida Arenas in Treatment: 34 Subjective Chief Complaint Information obtained from Patient RLE and left foot wounds History of Present Illness (HPI) 04/08/17; this is a complex 77 year old man referred here from [place] [facility]. He had been referred there for bilateral lower extremity edema with ulcer formation predominantly on the right calf but also the right foot. He had been receiving Unna boots bilaterally. The history here is long. He is not a diabetic however ICU looking through late 2018 he was worked up for chronic headaches, elevated inflammatory markers including C-reactive protein and ESR. He went on to actually have a left temporal artery biopsy wasn't that was negative.he received about 6 weeks of high-dose prednisone 60 mg with improvement in his inflammatory markers. He was admitted to hospital in late November with ventricular tachycardia syncope. He has known ischemic cardiomyopathy. He was admitted in the hospital in mid December. Apparently this was precipitated by a syncopal spell falling out of his scooter while at [place]. There was ventricular arrhythmia. He has  an implantable defibrillator and echocardiogram showed severe LV dysfunction with an EF of 20% and valvular regurgitations including mild AR,  moderate MR. There was no stenosis. He ruled in for a non-ST elevation MI in the setting of V. tach.his wife states that sometime during this hospitalization she noted multiple areas of skin change on the right lower calf which became evident just after he left the hospital. He was back in hospital in February with acute renal failure hyponatremia. This responded to fluid resuscitation.. Interestingly I can't see much description of his right leg at that point in time.he was followed by Dr. Nehemiah Massed of dermatology for the necrotic wounds on his right leg. Apparently a biopsy was planned at one point but not done although in some notes that suggests it was. I cannot see these results area He was noted to have a lot of edema. Was treated with bilateral Unna boots edges really helped with the swelling they have been using Bactroban to small open areas predominantly on the right anterior lower leg His history is complicated by the fact that he has rheumatoid arthritis followed by rheumatology. He is followed by neurology for disabling headaches. At one point this was felt to be giant cell arteritis although a left temporal biopsy was apparently negative. He was given a prolonged course of prednisone at 60 mg which managed his sedimentation rates but apparently did not prove improve the headaches. This is been tapered to off on by rheumatology on 03/13/17 Vascular had plans to do a venous reflux workup as well as arterial studies in May. They also wanted to get him a lymphedema pump. As mentioned he's been using bacitracin under Unna boot wraps to both lower legs 04/15/17; the patient arrives with most of his wounds improved. These are small punched out wounds. Most of them remaining ones are on the right anterior calf with the most problematic over the right lateral malleolus. There are no new areas. The symptom complex or potential symptom complex we are dealing with his chronic disabling headaches with  inflammatory markers not responsive to prednisone and with a negative temporal biopsy, lower extremity weakness, skin ulcerations just on the right leg. We have managed to get his arterial studies moved up to April 30. He has a rheumatology consult at [facility] in June. He has seen dermatology locally, rheumatology locally, neurology locally. 04/22/17; small punched out areas on the right leg anteriorly posteriorly. Most of these appear to have closed over. Some of them have eschar over the surface. The most problematic area appears to be over the right lateral malleolus. We've been using prisma to all of this. He has arterial studies on April 30 and a rheumatology consult at [facility] on June 28 04/29/17; most of the small punched out areas on the right leg posteriorly are closed. He has 2 or 3 openings anteriorly but Linehan, Sargent E. ([id number]) most of these appear to be on the way to closing. Still problematically over the right lateral malleolus and right lateral foot with almost ischemic-looking eschar. His arterial studies that I ordered are due to be done next week on the 30th so we should have them available for our next visit hopefully. He also saw a rheumatologist at [facility] and according to the patient he did 8 vials of blood. Finally he has scaling rash on his left foot and what looks to be at [facility] area on the left anterior leg 05/06/17; most of the small punched out areas on the right leg  posteriorly and anteriorly are closed. He still has one small one anteriorly one over the right lateral malleolus and one over the right lateral foot. He had his arterial studies they didn't seem to do waveform analysis not exactly sure why however in any case is ABIs were noncompressible bilaterally. They did provide TBIs although looking at the pressures it appears that his TBIs are quite normal. I therefore went ahead and debrided the area over the right lateral malleolus and the right lateral foot 05/13/17;  most of the small punched out areas on the right leg posteriorly and anteriorly are closed. He continues to have problematic areas over the right lateral malleolus and the right lateral foot. He still requiring aggressive debridement of these 2 wounds using silver collagen I reviewed the note from rheumatology I don't think they came up with a specific diagnosis although he is known to have seropositive RA. They did a panel of lab work when I was able to see his his AMA was negative, anti-smooth muscle antibodies negative antineutrophil cytoplasmic antibodies negative,Liv/kia type 1 negative. Serum C3 and C4 were negative. I don't see his muscle enzymes specifically. He was referred back to his local rheumatologist for management of his known rheumatoid arthritis.the patient states he still feels weak and fatigued. He states he has numbness in both feet and apparently is known to have neuropathy 05/20/17; the patient continues to have a difficult problem on the right lateral malleolus and the right lateral foot. Both of these wounds have no viable surface even with attempts at debridement. He has a new wound on the left plantar metatarsal head which looks more like a superficial diabetic pressure related injury then part of this underlying issue he has. Most of the rest of the wounds on his legs look satisfactory. Mostly on the right calf.reviewed his arterial studies which showed noncompressible vessels bilaterally but the TBIs were quite normal. 05/27/17; no real improvement in the right lateral malleolus and right lateral foot. In fact the right lateral foot is now on bone. I gave him doxycycline empirically last week it appears that he developed photosensitivity was 63 the son in a tractor. I'll not give him any more of this. This is predominantly on his face and dorsal forearms and hands. I given this more as an anti- inflammatory. I'm going to have him seen by vascular surgery. His TBIs that he  had done previously ordered by Dr. Brigitte Pulse were in the normal range They never did full arterial studies on him. he now has small punched out wounds on the right lateral ankle and right lateral foot. I doubt these are ischemic however I would like a review of his macrovascular status. He does describe some pain at night. I'll reduce the compression from 3 layer to 2 layer and I'm not convinced that this is a macrovascular issue however I want to make sure. We've been using silver alginate 06/03/17; the patient's x-rays that I ordered last time of his right ankle and right lateral foot did not show definite osteomyelitis. We've been using silver alginate. The wounds are not making any progress. The area on the plantar left first metatarsal head however appears to be better. The patient complains of weakness that is more of the fatigue. He says if he's walking his head will fall onto his chest and that his legs literally gave out on him. He did see neurology in the past however that was at a time where his workup was for temporal arteritis  and headaches. 06/10/17; the patient is making no progress with the areas on the right lateral foot and right lateral malleolus. In fact the area on the foot probes to bone. X-ray did not show osteomyelitis. He went and saw vein and vascular on 06/04/17 he was felt to have significant reflux in the left greater saphenous vein over this is not in the area we are most concerned about. He could be offered ablation. He was not felt to have venous reflux noted in the right lower extremity. He was felt to have some degree of lymphedema and he was felt to be a candidate for compression pumps. Finally a diagnostic arteriogram was suggested which is really what I'm most interested in. The patient has wife wanted time to think about this, I think they were confused about interacting between venous and arterial discussions. I think it would be probably well worth going through the  angiogram. Culture I did have the deeper area on the right lateral foot showed a few Enterococcus faecalis. I would like to start her on amoxicillin which they will start today for one-week 500 3 times a day 06/17/17; the patient still has punched out areas on the right lateral foot and right lateral malleolus. There is not a viable surface here. We have been using Santyl. He also has an area on the plantar aspect of his left first metatarsal head this also seems to be better 06/24/17; patient's angiogram as on 07/06/17; still has punched out areas on the right lateral foot and right lateral malleolus to which we've been applying Santyl-based dressings. He also has a more superficial area on the left plantar first metatarsal head, using collagen here 07/08/17; the patient had his angiogram. This perineal artery had a 70-80% stenosis. Similarly the posterior tibial artery also was diseased. Furthermore down the posterior tibial artery in the distal segment was a short stenosis of 80%. His major vessels in his thigh and only minor irregularity. He had percutaneous angioplasty of the proximal right peroneal artery. Also angioplasty of the tibial peroneal trunk and proximal posterior tibial artery as well as the mid to distal segment of the posterior tibial artery. He handled this remarkably well. DNAIEL, VOLLER ([id number]) The patient arrived with a new wound on his right anterior calf which I think is the reopening of one of the original small open areas 07/15/17; the areas on his right anterior calf is new. He has a new threatened area on the right tibia more superiorly which is not open yet but makes me wonder whether this is going to reopen as well. His original wounds on the right lateral foot and right lateral malleolus are somewhat better in terms of surface 07/22/17; the patient has a second open area on the right anterior. This was a threatened area superiorly last week. Each time this happens  she has a small wound with a nephrotic cover. The areas on the right lateral foot and right lateral malleolus are about the same. I did not attempt debridement in any of these today continuing with Santyl. The area on the left second metatarsal head looks better and he is healed laceration injuries on the arm from a fall last week with only the ventral forearm wound left 08/05/17 08/05/17; 2 week follow-up. This is a patient who initially presented with a multitude of small punched out areas on the right anterior calf greater than left dorsal foot. This was in the setting of a constitutional illness at which time he  saw multiple specialists was worked up for various rheumatologic diseases including temporal arteritis. Most of the areas on the right leg and a few on the left actually closed over however he he developed 2 small deep probing areas on the right lateral foot and right lateral malleolus. He also has an area on the left plantar first metatarsal head which is gradually been getting better. He was revascularized by Dr. dew about 4 weeks ago. He went to see Dr. Nehemiah Massed on 07/30/17; from review the note in time to the patient there wasn't an obvious cause. He did do a shave biopsy of the right anterior leg ulcer rule out cancer or other etiologies such as some embolic. This looks like some form of vasculopathy to me although trying to explain why it so much worse on the right leg than the left is been difficult. We will await the biopsy results. Dr. Nehemiah Massed wanted to put Bactroban and this not sure what that will do for the areas that have a completely nonviable surface. I'd like to go back to [place]. 08/19/17-He is here in follow up evaluation for multiple ulcerations to the right lower extremity and right foot and the left planter first metatarsal head ulcer. He has had biopsy to the right lateral proximal leg; no results available for review, patient and spouse report "negative" biopsy. We  will continue with current treatment plan, using ace wrap compression to the right leg as his 15-28mHg compression stockings are providing suboptimal edema control. He will follow up in two weeks 09/02/17; the patient's biopsy done by dermatology/Dr. KNehemiah Massedwas apparently "negative". He has 2 open areas on the right calf one was the biopsy site on the right lateral calf. Most problematic wounds are on the right lateral malleolus and on the right lateral foot. Both of these have some depth we've been using Santyl here. Also now a small open area remaining on the left plantar foot 09/16/17 ; every week follow-up. The patient has been using Santyl to the area on the right lateral ankle and right lateral foot. All the areas on the right calf including the biopsy site are closed. He still has the area on the first metatarsal head on the left. We've been using collagen to this as well Biweekly visit. The area on the left first metatarsal head is just about closed. We have a new tape injury on the dorsal left foot that was caused by her nurse taking off his dressing. This is superficial. He still has difficult areas on the right lateral foot and right lateral malleolus. He been using Endoform. I was hoping we were going to see better results than today. Still required debridement. I'm going to put Oasis through his insurance 10/14/17; the patient arrives today with really not a lot of change in the small punched-out areas on the right lateral foot and right lateral malleolus. I have been using Endoform. He is also apparently okay for Oasis as he has a good secondary insurance to [employer] Nevertheless the wound bed is not ready for application 185/88/50 the patient arrives with not a lot of change in the small punched out areas on the right lateral foot and right lateral malleolus. We've been using Endoform. Also this week he arrives with 2 blisters on the right lateral foot. He thinks this may have been  secondary to a Band-Aid that pulled on the skin that was put over this area. He apparently also had unna paste on this area. Finally he has  a fine macular rash on the left lower leg very pruritic. The cause of this is not clear he's been using hydrocortisone cream 11/04/17; the patient arrives with better looking wound surfaces on the right lateral foot and right lateral malleolus. He has a blister on the right lateral foot there is left a small open area just above the wounds. We have been using Santyl to the major areas. The patient had his angiogram on the left. There was nothing in the renal arteries aorta or iliac arteries he had diffusely calcific but nonstenotic common femoral arteries, , profunda femoris artery and popliteal arteries. The anterior tibial artery was occluded with poor reconstruction distally. The peroneal artery was patent without focal stenosis and was the dominant runoff [place], Bryam E. ([id number]) vessel. Posterior tibial artery re-constituted at the ankle I believe from collaterals from the peroneal artery. Proximal posterior tibial artery was nearly occluded with a greater than 90% the patient had a angioplasty of the left posterior tibial artery and tibioperoneal trunk. This procedure was on 11/02/17. Since then the patient has developed a petechial rash on the medial left calf and medial dorsal foot as well as his toes. This is compatible with cholesterol emboli/atheromatous emboli. There is no open area. 11/18/17; the petechial rash after his procedure on the left has resolved. The 2 open areas on the right lateral malleolus and right lateral foot are unfortunately about the same. We've been using Santyl. He is approved for Oasis with the surface of this is simply not ready for this 12/02/2017 In today for follow-up management of two open wounds on the right lateral malleolus and right lateral foot to the right foot. Wounds have not been making much progress.  Recently had angiogram completed on both legs. Due to poor perfusion, he is not able to have debridement. He does report a decrease in his appetite not really eating much. Denies fever, chills, SOB, or pain. Patient History Information obtained from Patient. Family History No family history of Cancer, Diabetes, Heart Disease, Hereditary Spherocytosis, Hypertension, Kidney Disease, Lung Disease, Seizures, Stroke, Thyroid Problems, Tuberculosis. Social History Never smoker, Marital Status - Married, Alcohol Use - Never, Drug Use - No History, Caffeine Use - Rarely. Review of Systems (ROS) Constitutional Symptoms (General Health) The patient has no complaints or symptoms. Respiratory The patient has no complaints or symptoms. Cardiovascular The patient has no complaints or symptoms. Integumentary (Skin) Complains or has symptoms of Wounds - right LE. Objective Constitutional appears in no distress. Vitals Time Taken: 11:10 AM, Height: 68 in, Weight: 161 lbs, BMI: 24.5, Temperature: 97.5 F, Pulse: 75 bpm, Respiratory Rate: 16 breaths/min, Blood Pressure: 80/44 mmHg. General Notes: manual BP 78/46, NP notified of low BP, patient's cardiologist is aware that patient's BP has been low r/t increasing furosemide d/t weight gain Respiratory Respiratory effort is easy and symmetric bilaterally. Rate is normal at rest and on room air.. Cardiovascular Extremities are free of edema.[name] JAXSIN, BOTTOMLEY ([id number]) Integumentary (Hair, Skin) Right LE wond. Wound #6 status is Open. Original cause of wound was Gradually Appeared. The wound is located on the Right,Lateral Malleolus. The wound measures 0.3cm length x 0.2cm width x 0.2cm depth; 0.047cm^2 area and 0.009cm^3 volume. There is no tunneling or undermining noted. There is a medium amount of serous drainage noted. The wound margin is flat and intact. There is no granulation within the wound bed. There is a medium (34-66%) amount of  necrotic tissue within the wound bed including Adherent Slough. The periwound  skin appearance did not exhibit: Callus, Crepitus, Excoriation, Induration, Rash, Scarring, Dry/Scaly, Maceration, Atrophie Blanche, Cyanosis, Ecchymosis, Hemosiderin Staining, Mottled, Pallor, Rubor, Erythema. Periwound temperature was noted as No Abnormality. Wound #8 status is Open. Original cause of wound was Gradually Appeared. The wound is located on the Right,Distal,Lateral Foot. The wound measures 0.3cm length x 0.2cm width x 0.2cm depth; 0.047cm^2 area and 0.009cm^3 volume. There is no tunneling or undermining noted. There is a medium amount of serous drainage noted. The wound margin is flat and intact. There is no granulation within the wound bed. There is a large (67-100%) amount of necrotic tissue within the wound bed including Adherent Slough. The periwound skin appearance did not exhibit: Callus, Crepitus, Excoriation, Induration, Rash, Scarring, Dry/Scaly, Maceration, Atrophie Blanche, Cyanosis, Ecchymosis, Hemosiderin Staining, Mottled, Pallor, Rubor, Erythema. Periwound temperature was noted as No Abnormality. 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 There was a Chemical/Enzymatic/Mechanical debridement performed by Beather Arbour, FNP. after achieving pain control using Lidocaine. Agent used was [employer]. A time out was conducted at 11:39, prior to the start of the procedure. There was no bleeding. The procedure was tolerated well with a pain level of  0 throughout and a pain level of 0 following the procedure. Post Debridement Measurements: 0.3cm length x 0.2cm width x 0.2cm depth; 0.009cm^3 volume. Character of Wound/Ulcer Post Debridement requires further debridement. Severity of Tissue Post Debridement is: Fat layer exposed. Post procedure Diagnosis Wound #6: Same as Pre-Procedure Wound #8 Pre-procedure diagnosis of Wound #8 is an Arterial Insufficiency Ulcer located on the Right,Distal,Lateral Foot .Severity of Tissue Pre Debridement is: Fat layer exposed. There was a Chemical/Enzymatic/Mechanical debridement performed by Beather Arbour, FNP. after achieving pain control using Lidocaine. Agent used was [employer]. A time out was conducted at DUFFY, DANTONIO ([id number]) 11:39, prior to the start of the procedure. There was no bleeding. The procedure was tolerated well with a pain level of 0 throughout and a pain level of 0 following the procedure. Post Debridement Measurements: 0.3cm length x 0.2cm width x 0.2cm depth; 0.009cm^3 volume. Character of Wound/Ulcer Post Debridement requires further debridement. Severity of Tissue Post Debridement is: Fat layer exposed. Post procedure Diagnosis Wound #8: Same as Pre-Procedure Plan Wound Cleansing: Wound #6 Right,Lateral Malleolus: Clean wound with Normal Saline. Wound #8 Right,Distal,Lateral Foot: Clean wound with Normal Saline. Anesthetic (add to Medication List): Wound #6 Right,Lateral Malleolus: Topical Lidocaine 4% cream applied to wound bed prior to debridement (In Clinic Only). Wound #8 Right,Distal,Lateral Foot: Topical Lidocaine 4% cream applied to wound bed prior to debridement (In Clinic Only). Primary Wound Dressing: Wound #6 Right,Lateral Malleolus: Santyl Ointment Wound #8 Right,Distal,Lateral Foot: Santyl Ointment Secondary Dressing: Wound #6 Right,Lateral Malleolus: ABD pad Conform/Kerlix Wound #8 Right,Distal,Lateral Foot: ABD pad Conform/Kerlix Dressing  Change Frequency: Wound #6 Right,Lateral Malleolus: Change Dressing Monday, Wednesday, Friday Wound #8 Right,Distal,Lateral Foot: Change Dressing Monday, Wednesday, Friday Follow-up Appointments: Wound #6 Right,Lateral Malleolus: Return Appointment in 2 weeks. Wound #8 Right,Distal,Lateral Foot: Return Appointment in 2 weeks. Edema Control: Wound #6 Right,Lateral Malleolus: Elevate legs to the level of the heart and pump ankles as often as possible  Wound #8 Right,Distal,Lateral Foot: Elevate legs to the level of the heart and pump ankles as often as possible Home Health: Wound #6 Right,Lateral Malleolus: [place] Visits - Monday, Wednesday and Friday on weeks patient does not come to wound care center. Home Health Nurse may visit PRN to address patient s wound care needs. FACE TO FACE ENCOUNTER: MEDICARE and MEDICAID PATIENTS: I certify that this patient is under my care and that I had a face-to-face encounter that meets the physician face-to-face encounter requirements with this patient on this date. The encounter with the patient was in whole or in part for the following MEDICAL CONDITION: (primary reason for [place]) MEDICAL NECESSITY: I certify, that based on my findings, NURSING services are a medically necessary home DURIEL, DEERY ([id number]) health service. HOME BOUND STATUS: I certify that my clinical findings support that this patient is homebound (i.e., Due to illness or injury, pt requires aid of supportive devices such as crutches, cane, wheelchairs, walkers, the use of special transportation or the assistance of another person to leave their place of residence. There is a normal inability to leave the home and doing so requires considerable and taxing effort. Other absences are for medical reasons / religious services and are infrequent or of short duration when for other reasons). If current dressing causes regression in wound condition, may D/C  ordered dressing product/s and apply Normal Saline Moist Dressing daily until next [place] / Other MD appointment. [place] of regression in wound condition at [phone]. Please direct any NON-WOUND related issues/requests for orders to patient's Primary Care Physician Wound #8 Right,Distal,Lateral Foot: [place] Visits - Monday, Wednesday and Friday on weeks patient does not come to wound care center. Home Health Nurse may visit PRN to address patient s wound care needs. FACE TO FACE ENCOUNTER: MEDICARE and MEDICAID PATIENTS: I certify that this patient is under my care and that I had a face-to-face encounter that meets the physician face-to-face encounter requirements with this patient on this date. The encounter with the patient was in whole or in part for the following MEDICAL CONDITION: (primary reason for [place]) MEDICAL NECESSITY: I certify, that based on my findings, NURSING services are a medically necessary home health service. HOME BOUND STATUS: I certify that my clinical findings support that this patient is homebound (i.e., Due to illness or injury, pt requires aid of supportive devices such as crutches, cane, wheelchairs, walkers, the use of special transportation or the assistance of another person to leave their place of residence. There is a normal inability to leave the home and doing so requires considerable and taxing effort. Other absences are for medical reasons / religious services and are infrequent or of short duration when for other reasons). If current dressing causes regression in wound condition, may D/C ordered dressing product/s and apply Normal Saline Moist Dressing daily until next [place] / Other MD appointment. [place] of regression in wound condition at [phone]. Please direct any NON-WOUND related issues/requests for orders to patient's Primary Care  Physician Medications-please add to medication list.: Wound #6 Right,Lateral Malleolus: Santyl Enzymatic Ointment Wound #8 Right,Distal,Lateral Foot: Santyl Enzymatic Ointment Electronic Signature(s) Signed: 12/06/2017 5:29:42 PM By: Beather Arbour FNP-C Entered By: Beather Arbour on 12/02/2017 13:01:39 [place]elter, [name] ([id number]) -------------------------------------------------------------------------------- ROS/PFSH Details Patient Name: [name] Date of Service: 12/02/2017 11:00 AM Medical Record Number: [id number] Patient Account Number: [id number] Date of Birth/Sex: Aug 12, 1940 (76 y.o. M)  Treating RN: Cornell Barman Primary Care Provider: Cyndi Bender Other Clinician: Referring Provider: Cyndi Bender Treating Provider/Extender: Oneida Arenas in Treatment: 34 Information Obtained From Patient Wound History Do you currently have one or more open woundso Yes How many open wounds do you currently haveo 5 Approximately how long have you had your woundso Jan 15 2017 How have you been treating your wound(s) until nowo unna boot Has your wound(s) ever healed and then re-openedo No Have you had any lab work done in the past montho No Have you tested positive for an antibiotic resistant organism (MRSA, VRE)o No Have you tested positive for osteomyelitis (bone infection)o No Have you had any tests for circulation on your legso Yes Who ordered the testo [place] [facility] the test [facility] Have you had other problems associated with your woundso Swelling Integumentary (Skin) Complaints and Symptoms: Positive for: Wounds - right LE Constitutional Symptoms (General Health) Complaints and Symptoms: No Complaints or Symptoms Respiratory Complaints and Symptoms: No Complaints or Symptoms Cardiovascular Complaints and Symptoms: No Complaints or Symptoms Medical History: Positive for: Congestive Heart Failure; Coronary Artery Disease; Hypertension;  Myocardial Infarction Endocrine Medical History: Negative for: Type I Diabetes; Type II Diabetes Musculoskeletal Medical History: Positive for: Osteoarthritis ARMANDO, BUKHARI ([id number]) Immunizations Pneumococcal Vaccine: Received Pneumococcal Vaccination: No Implantable Devices Family and Social History Cancer: No; Diabetes: No; Heart Disease: No; Hereditary Spherocytosis: No; Hypertension: No; Kidney Disease: No; Lung Disease: No; Seizures: No; Stroke: No; Thyroid Problems: No; Tuberculosis: No; Never smoker; Marital Status - Married; Alcohol Use: Never; Drug Use: No History; Caffeine Use: Rarely; Financial Concerns: No; Food, Clothing or Shelter Needs: No; Support System Lacking: No; Transportation Concerns: No; Advanced Directives: No; Patient does not want information on Advanced Directives; Do not resuscitate: No; Living Will: Yes (Not Provided); Medical Power of Attorney: Yes - Marsa Aris (Not Provided) Physician Affirmation I have reviewed and agree with the above information. Electronic Signature(s) Signed: 12/02/2017 4:25:08 PM By: Gretta Cool, BSN, RN, CWS, Kim RN, BSN Signed: 12/06/2017 5:29:42 PM By: Beather Arbour FNP-C Entered By: Beather Arbour on 12/02/2017 13:00:02 Runkel, [name] ([id number]) -------------------------------------------------------------------------------- SuperBill Details Patient Name: [name] Date of Service: 12/02/2017 Medical Record Number: [id number] Patient Account Number: [id number] Date of Birth/Sex: 03-06-1940 (76 y.o. M) Treating RN: Cornell Barman Primary Care Provider: Cyndi Bender Other Clinician: Referring Provider: Cyndi Bender Treating Provider/Extender: Oneida Arenas in Treatment: 34 Diagnosis Coding ICD-10 Codes Code Description [phone] Non-pressure chronic ulcer of other part of right foot with fat layer exposed L97.521 Non-pressure chronic ulcer of other part of left foot limited to breakdown of  skin 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 of other part of foot I25.5 Ischemic cardiomyopathy Facility Procedures CPT4 Code: 61848592 Description: 76394 - DEBRIDE W/O ANES NON SELECT Modifier: Quantity: 1 CPT4 Code: 32003794 Description: 44619 - [place]BR[place] W/O ANES NON SELECT Modifier: Quantity: 1 Physician Procedures CPT4 Code: 0122241 Description: 14643 - WC PHYS LEVEL 3 - EST PT ICD-10 Diagnosis Description L97.512 Non-pressure chronic ulcer of other part of right foot with fat Modifier: layer exposed Quantity: 1 Electronic Signature(s) Signed: 12/06/2017 5:29:42 PM By: Beather Arbour FNP-C Entered By: Beather Arbour on 12/02/2017 13:03:04

## 2017-12-07 NOTE — Progress Notes (Signed)
JERRED, ZAREMBA ([id number]) Visit Report for 12/02/2017 Arrival Information Details Patient Name: RINALDO, MACQUEEN Date of Service: 12/02/2017 11:00 AM Medical Record Number: [id number] Patient Account Number: [id number] Date of Birth/Sex: 1940/10/17 (77 y.o. M) Treating RN: Montey Hora Primary Care Skyla Champagne: Cyndi Bender Other Clinician: Referring Virdia Ziesmer: Cyndi Bender Treating Dechelle Attaway/Extender: Oneida Arenas in Treatment: 34 Visit Information History Since Last Visit Added or deleted any medications: No Patient Arrived: Cane Any new allergies or adverse reactions: No Arrival Time: 11:09 Had a fall or experienced change in No Accompanied By: spouse activities of daily living that may affect Transfer Assistance: None risk of falls: Patient Identification Verified: Yes Signs or symptoms of abuse/neglect since last visito No Secondary Verification Process Completed: Yes Hospitalized since last visit: No Patient Requires Transmission-Based No Implantable device outside of the clinic excluding No Precautions: cellular tissue based products placed in the center Patient Has Alerts: Yes since last visit: Patient Alerts: R ABI >220 Has Dressing in Place as Prescribed: Yes L ABI Has Compression in Place as Prescribed: Yes >220 Pain Present Now: No Electronic Signature(s) Signed: 12/02/2017 3:52:47 PM By: Montey Hora Entered By: Montey Hora on 12/02/2017 11:09:48 Deshong, [name] ([id number]) -------------------------------------------------------------------------------- Encounter Discharge Information Details Patient Name: [name] Date of Service: 12/02/2017 11:00 AM Medical Record Number: [id number] Patient Account Number: [id number] Date of Birth/Sex: June 19, 1940 (77 y.o. M) Treating RN: Montey Hora Primary Care Dailyn Reith: Cyndi Bender Other Clinician: Referring Cerrone Debold: Cyndi Bender Treating Chesni Vos/Extender: Oneida Arenas in Treatment: 89 Encounter Discharge Information Items Post Procedure Vitals Discharge Condition: Stable Temperature (F): 97.5 Ambulatory Status: Ambulatory Pulse (bpm): 75 Discharge Destination: Home Respiratory Rate (breaths/min): 16 Transportation: Private Auto Blood Pressure (mmHg): 78/46 Accompanied By: wife Schedule Follow-up Appointment: No Clinical Summary of Care: Electronic Signature(s) Signed: 12/02/2017 3:52:47 PM By: Montey Hora Entered By: Montey Hora on 12/02/2017 11:51:39 S[place], [name] ([id number]) -------------------------------------------------------------------------------- Lower Extremity Assessment Details Patient Name: [name] Date of Service: 12/02/2017 11:00 AM Medical Record Number: [id number] Patient Account Number: [id number] Date of Birth/Sex: 03/21/1940 (77 y.o. M) Treating RN: Montey Hora Primary Care Juel Bellerose: Cyndi Bender Other Clinician: Referring Charish Schroepfer: Cyndi Bender Treating Daun Rens/Extender: Oneida Arenas in Treatment: 34 Vascular Assessment Pulses: Dorsalis Pedis Palpable: [Right:Yes] Posterior Tibial Extremity colors, hair growth, and conditions: Extremity Color: [Right:Normal] Hair Growth on Extremity: [Right:No] Temperature of Extremity: [Right:Cool] Capillary Refill: [Right:< 3 seconds] Toe Nail Assessment Left: Right: Thick: Yes Discolored: Yes Deformed: Yes Improper Length and Hygiene: Yes Electronic Signature(s) Signed: 12/02/2017 3:52:47 PM By: Montey Hora Entered By: Montey Hora on 12/02/2017 11:20:53 Kersten, [name] ([id number]) -------------------------------------------------------------------------------- Multi Wound Chart Details Patient Name: [name] Date of Service: 12/02/2017 11:00 AM Medical Record Number: [id number] Patient Account Number: [id number] Date of Birth/Sex: 1940-08-23 (77 y.o. M) Treating RN: Cornell Barman Primary Care  Zhion Pevehouse: Cyndi Bender Other Clinician: Referring Jaloni Sorber: Cyndi Bender Treating Davinia Riccardi/Extender: Oneida Arenas in Treatment: 34 Vital Signs Height(in): 68 Pulse(bpm): 75 Weight(lbs): 161 Blood Pressure(mmHg): 80/44 Body Mass Index(BMI): 24 Temperature(F): 97.5 Respiratory Rate 16 (breaths/min): Photos: [6:No Photos] [8:No Photos] [N/A:N/A] Wound Location: [6:Right Malleolus - Lateral] [8:Right Foot - Lateral, Distal] [N/A:N/A] Wounding Event: [6:Gradually Appeared] [8:Gradually Appeared] [N/A:N/A] Primary Etiology: [6:Arterial Insufficiency Ulcer] [8:Arterial Insufficiency Ulcer] [N/A:N/A] Comorbid History: [6:Congestive Heart Failure, Coronary Artery Disease, Hypertension, Myocardial Infarction, Osteoarthritis] [8:Congestive Heart Failure, Coronary Artery Disease, Hypertension, Myocardial Infarction, Osteoarthritis] [N/A:N/A] Date Acquired: [6:03/23/2017] [8:04/29/2017] [N/A:N/A] Weeks of Treatment: [6:34] [8:31] [N/A:N/A] Wound Status: [6:Open] [8:Open] [N/A:N/A] Measurements L x W  x D [6:0.3x0.2x0.2] [8:0.3x0.2x0.2] [N/A:N/A] (cm) Area (cm) : [6:0.047] [8:0.047] [N/A:N/A] Volume (cm) : [6:0.009] [8:0.009] [N/A:N/A] % Reduction in Area: [6:-51.60%] [8:33.80%] [N/A:N/A] % Reduction in Volume: [6:-200.00%] [8:-28.60%] [N/A:N/A] Classification: [6:Full Thickness Without Exposed Support Structures Support Structures] [8:Full Thickness With Exposed N/A] Exudate Amount: [6:Medium] [8:Medium] [N/A:N/A] Exudate Type: [6:Serous] [8:Serous] [N/A:N/A] Exudate Color: [6:amber] [8:amber] [N/A:N/A] Wound Margin: [6:Flat and Intact] [8:Flat and Intact] [N/A:N/A] Granulation Amount: [6:None Present (0%)] [8:None Present (0%)] [N/A:N/A] Necrotic Amount: [6:Medium (34-66%)] [8:Large (67-100%)] [N/A:N/A] Exposed Structures: [6:Fascia: No Fat Layer (Subcutaneous Tissue) Exposed: No Tendon: No Muscle: No Joint: No Bone: No] [8:Fascia: No Fat Layer (Subcutaneous Tissue) Exposed:  No Tendon: No Muscle: No Joint: No Bone: No] [N/A:N/A] Epithelialization: [6:None] [8:None] [N/A:N/A] Debridement: [6:Chemical/Enzymatic/Mechanical] [8:Chemical/Enzymatic/Mechanical] [N/A:N/A] Pre-procedure [6:11:39] [8:11:39] [N/A:N/A] Verification/Time Out Taken: Pain Control: [6:Lidocaine] [8:Lidocaine] [N/A:N/A] Instrument: N/A N/A N/A Bleeding: None None N/A Procedural Pain: 0 0 N/A Post Procedural Pain: 0 0 N/A Debridement Treatment Procedure was tolerated well Procedure was tolerated well N/A Response: Post Debridement 0.3x0.2x0.2 0.3x0.2x0.2 N/A Measurements L x W x D (cm) Post Debridement Volume: 0.009 0.009 N/A (cm) Periwound Skin Texture: Excoriation: No Excoriation: No N/A Induration: No Induration: No Callus: No Callus: No Crepitus: No Crepitus: No Rash: No Rash: No Scarring: No Scarring: No Periwound Skin Moisture: Maceration: No Maceration: No N/A Dry/Scaly: No Dry/Scaly: No Periwound Skin Color: Atrophie Blanche: No Atrophie Blanche: No N/A Cyanosis: No Cyanosis: No Ecchymosis: No Ecchymosis: No Erythema: No Erythema: No Hemosiderin Staining: No Hemosiderin Staining: No Mottled: No Mottled: No Pallor: No Pallor: No Rubor: No Rubor: No Temperature: No Abnormality No Abnormality N/A Tenderness on Palpation: No No N/A Wound Preparation: Ulcer Cleansing: Ulcer Cleansing: N/A Rinsed/Irrigated with Saline Rinsed/Irrigated with Saline Topical Anesthetic Applied: Topical Anesthetic Applied: Other: lidocaine 4% Other: lidocaine 4% Procedures Performed: Debridement Debridement N/A Treatment Notes Wound #6 (Right, Lateral Malleolus) Wound #8 (Right, Distal, Lateral Foot) Electronic Signature(s) Signed: 12/06/2017 5:29:42 PM By: Beather Arbour FNP-C Entered By: Beather Arbour on 12/02/2017 12:53:40 Pernell, [name] ([id number]) -------------------------------------------------------------------------------- Multi-Disciplinary Care Plan  Details Patient Name: [name] Date of Service: 12/02/2017 11:00 AM Medical Record Number: [id number] Patient Account Number: [id number] Date of Birth/Sex: 03/07/1940 (77 y.o. M) Treating RN: Cornell Barman Primary Care Conor Lata: Cyndi Bender Other Clinician: Referring Stanislaus Kaltenbach: Cyndi Bender Treating Drexler Maland/Extender: Oneida Arenas in Treatment: 49 Active Inactive Abuse / Safety / Falls / Self Care Management Nursing Diagnoses: History of Falls Goals: Patient will remain injury free related to falls Date Initiated: 04/08/2017 Target Resolution Date: 05/08/2017 Goal Status: Active Interventions: Assess fall risk on admission and as needed Notes: Necrotic Tissue Nursing Diagnoses: Impaired tissue integrity related to necrotic/devitalized tissue Goals: Necrotic/devitalized tissue will be minimized in the wound bed Date Initiated: 11/18/2017 Target Resolution Date: 12/02/2017 Goal Status: Active Interventions: Assess patient pain level pre-, during and post procedure and prior to discharge Treatment Activities: Excisional debridement : 11/18/2017 Notes: Orientation to the Wound Care Program Nursing Diagnoses: Knowledge deficit related to the wound healing center program Goals: Patient/caregiver will verbalize understanding of the [facility] Date Initiated: 04/08/2017 Target Resolution Date: 05/08/2017 Goal Status: Active KEEGAN, DUCEY ([id number]) Interventions: Provide education on orientation to the wound center Notes: Soft Tissue Infection Nursing Diagnoses: Impaired tissue integrity Potential for infection: soft tissue Goals: Patient will remain free of wound infection Date Initiated: 04/08/2017 Target Resolution Date: 05/08/2017 Goal Status: Active Interventions: Assess signs and symptoms of infection every visit Notes: Wound/Skin Impairment Nursing Diagnoses: Impaired tissue integrity  Goals: Ulcer/skin breakdown will heal  within 14 weeks Date Initiated: 04/08/2017 Target Resolution Date: 07/20/2017 Goal Status: Active Interventions: Assess patient/caregiver ability to perform ulcer/skin care regimen upon admission and as needed Provide education on ulcer and skin care Treatment Activities: Topical wound management initiated : 04/08/2017 Notes: Electronic Signature(s) Signed: 12/02/2017 4:25:08 PM By: Gretta Cool, BSN, RN, CWS, Kim RN, BSN Entered By: Gretta Cool, BSN, RN, CWS, Kim on 12/02/2017 11:38:27 [place]tox, [name] ([id number]) -------------------------------------------------------------------------------- Pain Assessment Details Patient Name: [name] Date of Service: 12/02/2017 11:00 AM Medical Record Number: [id number] Patient Account Number: [id number] Date of Birth/Sex: [birth year] (77 y.o. M) Treating RN: Montey Hora Primary Care Winner Valeriano: Cyndi Bender Other Clinician: Referring Denea Cheaney: Cyndi Bender Treating Chau Savell/Extender: Oneida Arenas in Treatment: 34 Active Problems Location of Pain Severity and Description of Pain Patient Has Paino No Site Locations Pain Management and Medication Current Pain Management: Electronic Signature(s) Signed: 12/02/2017 3:52:47 PM By: Montey Hora Entered By: Montey Hora on 12/02/2017 11:09:55 Petrella, [name] ([id number]) -------------------------------------------------------------------------------- Patient/Caregiver Education Details Patient Name: [name] Date of Service: 12/02/2017 11:00 AM Medical Record Number: [id number] Patient Account Number: [id number] Date of Birth/Gender: Feb 02, 1940 (77 y.o. M) Treating RN: Cornell Barman Primary Care Physician: Cyndi Bender Other Clinician: Referring Physician: Cyndi Bender Treating Physician/Extender: Oneida Arenas in Treatment: 78 Education Assessment Education Provided To: Patient Education Topics Provided Wound/Skin Impairment: Handouts: Caring for Your  Ulcer, Other: Wound care as prescribed Methods: Demonstration, Explain/Verbal Responses: State content correctly Electronic Signature(s) Signed: 12/02/2017 3:52:47 PM By: Montey Hora Entered By: Montey Hora on 12/02/2017 11:51:44 Salomon, [name] ([id number]) -------------------------------------------------------------------------------- Wound Assessment Details Patient Name: [name] Date of Service: 12/02/2017 11:00 AM Medical Record Number: [id number] Patient Account Number: [id number] Date of Birth/Sex: July 28, 1940 (77 y.o. M) Treating RN: Montey Hora Primary Care Mehlani Blankenburg: Cyndi Bender Other Clinician: Referring Aydenn Gervin: Cyndi Bender Treating Theadore Blunck/Extender: Beather Arbour Weeks in Treatment: 34 Wound Status Wound Number: 6 Primary Arterial Insufficiency Ulcer Etiology: Wound Location: Right Malleolus - Lateral Wound Open Wounding Event: Gradually Appeared Status: Date Acquired: 03/23/2017 Comorbid Congestive Heart Failure, Coronary Artery Weeks Of Treatment: 34 History: Disease, Hypertension, Myocardial Infarction, Clustered Wound: No Osteoarthritis Photos Photo Uploaded By: Montey Hora on 12/02/2017 13:02:00 Wound Measurements Length: (cm) 0.3 Width: (cm) 0.2 Depth: (cm) 0.2 Area: (cm) 0.047 Volume: (cm) 0.009 % Reduction in Area: -51.6% % Reduction in Volume: -200% Epithelialization: None Tunneling: No Undermining: No Wound Description Full Thickness Without Exposed Support Classification: Structures Wound Margin: Flat and Intact Exudate Medium Amount: Exudate Type: Serous Exudate Color: amber Foul Odor After Cleansing: No Slough/Fibrino Yes Wound Bed Granulation Amount: None Present (0%) Exposed Structure Necrotic Amount: Medium (34-66%) Fascia Exposed: No Necrotic Quality: Adherent Slough Fat Layer (Subcutaneous Tissue) Exposed: No Tendon Exposed: No Muscle Exposed: No Joint Exposed: No Bone Exposed:  No Hollister, Nilo E. ([id number]) Periwound Skin Texture Texture Color No Abnormalities Noted: No No Abnormalities Noted: No Callus: No Atrophie Blanche: No Crepitus: No Cyanosis: No Excoriation: No Ecchymosis: No Induration: No Erythema: No Rash: No Hemosiderin Staining: No Scarring: No Mottled: No Pallor: No Moisture Rubor: No No Abnormalities Noted: No Dry / Scaly: No Temperature / Pain Maceration: No Temperature: No Abnormality Wound Preparation Ulcer Cleansing: Rinsed/Irrigated with Saline Topical Anesthetic Applied: Other: lidocaine 4%, Treatment Notes Wound #6 (Right, Lateral Malleolus) Electronic Signature(s) Signed: 12/02/2017 3:52:47 PM By: Montey Hora Entered By: Montey Hora on 12/02/2017 11:19:46 Speciale, [name] ([id number]) -------------------------------------------------------------------------------- Wound Assessment Details Patient Name: [name] Date of Service:  12/02/2017 11:00 AM Medical Record Number: [id number] Patient Account Number: [id number] Date of Birth/Sex: 10/08/1940 (77 y.o. M) Treating RN: Montey Hora Primary Care Kandon Hosking: Cyndi Bender Other Clinician: Referring Jeremaih Klima: Cyndi Bender Treating Adger Cantera/Extender: Beather Arbour Weeks in Treatment: 34 Wound Status Wound Number: 8 Primary Arterial Insufficiency Ulcer Etiology: Wound Location: Right Foot - Lateral, Distal Wound Open Wounding Event: Gradually Appeared Status: Date Acquired: 04/29/2017 Comorbid Congestive Heart Failure, Coronary Artery Weeks Of Treatment: 31 History: Disease, Hypertension, Myocardial Infarction, Clustered Wound: No Osteoarthritis Photos Photo Uploaded By: Montey Hora on 12/02/2017 13:01:43 Wound Measurements Length: (cm) 0.3 Width: (cm) 0.2 Depth: (cm) 0.2 Area: (cm) 0.047 Volume: (cm) 0.009 % Reduction in Area: 33.8% % Reduction in Volume: -28.6% Epithelialization: None Tunneling: No Undermining:  No Wound Description Full Thickness With Exposed Support Classification: Structures Wound Margin: Flat and Intact Exudate Medium Amount: Exudate Type: Serous Exudate Color: amber Foul Odor After Cleansing: No Slough/Fibrino Yes Wound Bed Granulation Amount: None Present (0%) Exposed Structure Necrotic Amount: Large (67-100%) Fascia Exposed: No Necrotic Quality: Adherent Slough Fat Layer (Subcutaneous Tissue) Exposed: No Tendon Exposed: No Muscle Exposed: No Joint Exposed: No Bone Exposed: No Grismore, Fount E. ([id number]) Periwound Skin Texture Texture Color No Abnormalities Noted: No No Abnormalities Noted: No Callus: No Atrophie Blanche: No Crepitus: No Cyanosis: No Excoriation: No Ecchymosis: No Induration: No Erythema: No Rash: No Hemosiderin Staining: No Scarring: No Mottled: No Pallor: No Moisture Rubor: No No Abnormalities Noted: No Dry / Scaly: No Temperature / Pain Maceration: No Temperature: No Abnormality Wound Preparation Ulcer Cleansing: Rinsed/Irrigated with Saline Topical Anesthetic Applied: Other: lidocaine 4%, Treatment Notes Wound #8 (Right, Distal, Lateral Foot) Electronic Signature(s) Signed: 12/02/2017 3:52:47 PM By: Montey Hora Entered By: Montey Hora on 12/02/2017 11:20:00 Griffin, [name] ([id number]) -------------------------------------------------------------------------------- Vitals Details Patient Name: [name] Date of Service: 12/02/2017 11:00 AM Medical Record Number: [id number] Patient Account Number: [id number] Date of Birth/Sex: [birth year] (77 y.o. M) Treating RN: Montey Hora Primary Care Tiquan Bouch: Cyndi Bender Other Clinician: Referring Chadwick Reiswig: Cyndi Bender Treating Yaffa Seckman/Extender: Oneida Arenas in Treatment: 34 Vital Signs Time Taken: 11:10 Temperature (F): 97.5 Height (in): 68 Pulse (bpm): 75 Weight (lbs): 161 Respiratory Rate (breaths/min): 16 Body Mass Index (BMI):  24.5 Blood Pressure (mmHg): 80/44 Reference Range: 80 - 120 mg / dl Notes manual BP 78/46, NP notified of low BP, patient's cardiologist is aware that patient's BP has been low r/t increasing furosemide d/t weight gain Electronic Signature(s) Signed: 12/02/2017 3:52:47 PM By: Montey Hora Entered By: Montey Hora on 12/02/2017 11:14:56

## 2017-12-08 DIAGNOSIS — I472 Ventricular tachycardia: Secondary | ICD-10-CM | POA: Diagnosis not present

## 2017-12-09 DIAGNOSIS — L97311 Non-pressure chronic ulcer of right ankle limited to breakdown of skin: Secondary | ICD-10-CM | POA: Diagnosis not present

## 2017-12-09 DIAGNOSIS — Z48817 Encounter for surgical aftercare following surgery on the skin and subcutaneous tissue: Secondary | ICD-10-CM | POA: Diagnosis not present

## 2017-12-09 DIAGNOSIS — I255 Ischemic cardiomyopathy: Secondary | ICD-10-CM | POA: Diagnosis not present

## 2017-12-09 DIAGNOSIS — I2510 Atherosclerotic heart disease of native coronary artery without angina pectoris: Secondary | ICD-10-CM | POA: Diagnosis not present

## 2017-12-09 DIAGNOSIS — L84 Corns and callosities: Secondary | ICD-10-CM | POA: Diagnosis not present

## 2017-12-09 DIAGNOSIS — L97511 Non-pressure chronic ulcer of other part of right foot limited to breakdown of skin: Secondary | ICD-10-CM | POA: Diagnosis not present

## 2017-12-10 DIAGNOSIS — R531 Weakness: Secondary | ICD-10-CM | POA: Diagnosis not present

## 2017-12-10 DIAGNOSIS — Z23 Encounter for immunization: Secondary | ICD-10-CM | POA: Diagnosis not present

## 2017-12-10 DIAGNOSIS — E038 Other specified hypothyroidism: Secondary | ICD-10-CM | POA: Diagnosis not present

## 2017-12-10 DIAGNOSIS — I509 Heart failure, unspecified: Secondary | ICD-10-CM | POA: Diagnosis not present

## 2017-12-10 DIAGNOSIS — I2510 Atherosclerotic heart disease of native coronary artery without angina pectoris: Secondary | ICD-10-CM | POA: Diagnosis not present

## 2017-12-11 DIAGNOSIS — L97311 Non-pressure chronic ulcer of right ankle limited to breakdown of skin: Secondary | ICD-10-CM | POA: Diagnosis not present

## 2017-12-11 DIAGNOSIS — L84 Corns and callosities: Secondary | ICD-10-CM | POA: Diagnosis not present

## 2017-12-11 DIAGNOSIS — I2510 Atherosclerotic heart disease of native coronary artery without angina pectoris: Secondary | ICD-10-CM | POA: Diagnosis not present

## 2017-12-11 DIAGNOSIS — I255 Ischemic cardiomyopathy: Secondary | ICD-10-CM | POA: Diagnosis not present

## 2017-12-11 DIAGNOSIS — Z48817 Encounter for surgical aftercare following surgery on the skin and subcutaneous tissue: Secondary | ICD-10-CM | POA: Diagnosis not present

## 2017-12-11 DIAGNOSIS — L97511 Non-pressure chronic ulcer of other part of right foot limited to breakdown of skin: Secondary | ICD-10-CM | POA: Diagnosis not present

## 2017-12-14 DIAGNOSIS — Z950 Presence of cardiac pacemaker: Secondary | ICD-10-CM | POA: Diagnosis not present

## 2017-12-14 DIAGNOSIS — E039 Hypothyroidism, unspecified: Secondary | ICD-10-CM | POA: Diagnosis not present

## 2017-12-14 DIAGNOSIS — I5022 Chronic systolic (congestive) heart failure: Secondary | ICD-10-CM | POA: Diagnosis not present

## 2017-12-14 DIAGNOSIS — R531 Weakness: Secondary | ICD-10-CM | POA: Diagnosis not present

## 2017-12-14 DIAGNOSIS — I739 Peripheral vascular disease, unspecified: Secondary | ICD-10-CM | POA: Diagnosis not present

## 2017-12-14 DIAGNOSIS — N179 Acute kidney failure, unspecified: Secondary | ICD-10-CM | POA: Diagnosis not present

## 2017-12-14 DIAGNOSIS — E0590 Thyrotoxicosis, unspecified without thyrotoxic crisis or storm: Secondary | ICD-10-CM | POA: Diagnosis not present

## 2017-12-14 DIAGNOSIS — M1990 Unspecified osteoarthritis, unspecified site: Secondary | ICD-10-CM | POA: Diagnosis not present

## 2017-12-14 DIAGNOSIS — E785 Hyperlipidemia, unspecified: Secondary | ICD-10-CM | POA: Diagnosis not present

## 2017-12-14 DIAGNOSIS — I361 Nonrheumatic tricuspid (valve) insufficiency: Secondary | ICD-10-CM | POA: Diagnosis not present

## 2017-12-14 DIAGNOSIS — Z7982 Long term (current) use of aspirin: Secondary | ICD-10-CM | POA: Diagnosis not present

## 2017-12-14 DIAGNOSIS — I351 Nonrheumatic aortic (valve) insufficiency: Secondary | ICD-10-CM | POA: Diagnosis not present

## 2017-12-14 DIAGNOSIS — E860 Dehydration: Secondary | ICD-10-CM | POA: Diagnosis not present

## 2017-12-14 DIAGNOSIS — I252 Old myocardial infarction: Secondary | ICD-10-CM | POA: Diagnosis not present

## 2017-12-14 DIAGNOSIS — E46 Unspecified protein-calorie malnutrition: Secondary | ICD-10-CM | POA: Diagnosis not present

## 2017-12-14 DIAGNOSIS — R931 Abnormal findings on diagnostic imaging of heart and coronary circulation: Secondary | ICD-10-CM | POA: Diagnosis not present

## 2017-12-14 DIAGNOSIS — R079 Chest pain, unspecified: Secondary | ICD-10-CM | POA: Diagnosis not present

## 2017-12-14 DIAGNOSIS — R51 Headache: Secondary | ICD-10-CM | POA: Diagnosis not present

## 2017-12-14 DIAGNOSIS — I517 Cardiomegaly: Secondary | ICD-10-CM | POA: Diagnosis not present

## 2017-12-14 DIAGNOSIS — I358 Other nonrheumatic aortic valve disorders: Secondary | ICD-10-CM | POA: Diagnosis not present

## 2017-12-14 DIAGNOSIS — I110 Hypertensive heart disease with heart failure: Secondary | ICD-10-CM | POA: Diagnosis not present

## 2017-12-14 DIAGNOSIS — I2510 Atherosclerotic heart disease of native coronary artery without angina pectoris: Secondary | ICD-10-CM | POA: Diagnosis not present

## 2017-12-14 DIAGNOSIS — M069 Rheumatoid arthritis, unspecified: Secondary | ICD-10-CM | POA: Diagnosis not present

## 2017-12-14 DIAGNOSIS — Z951 Presence of aortocoronary bypass graft: Secondary | ICD-10-CM | POA: Diagnosis not present

## 2017-12-16 ENCOUNTER — Other Ambulatory Visit: Payer: Self-pay

## 2017-12-16 ENCOUNTER — Inpatient Hospital Stay
Admission: EM | Admit: 2017-12-16 | Discharge: 2017-12-18 | DRG: 641 | Disposition: A | Discharge status: Home / Self Care | Payer: Medicare Other | Attending: Internal Medicine | Admitting: Internal Medicine

## 2017-12-16 ENCOUNTER — Encounter: Payer: Self-pay | Admitting: Emergency Medicine

## 2017-12-16 ENCOUNTER — Emergency Department: Payer: Medicare Other

## 2017-12-16 ENCOUNTER — Encounter: Payer: Medicare Other | Attending: Internal Medicine | Admitting: Internal Medicine

## 2017-12-16 DIAGNOSIS — I2510 Atherosclerotic heart disease of native coronary artery without angina pectoris: Secondary | ICD-10-CM | POA: Diagnosis present

## 2017-12-16 DIAGNOSIS — R531 Weakness: Secondary | ICD-10-CM

## 2017-12-16 DIAGNOSIS — E872 Acidosis: Secondary | ICD-10-CM | POA: Diagnosis present

## 2017-12-16 DIAGNOSIS — Z882 Allergy status to sulfonamides status: Secondary | ICD-10-CM

## 2017-12-16 DIAGNOSIS — Z9841 Cataract extraction status, right eye: Secondary | ICD-10-CM

## 2017-12-16 DIAGNOSIS — I70233 Atherosclerosis of native arteries of right leg with ulceration of ankle: Secondary | ICD-10-CM | POA: Diagnosis not present

## 2017-12-16 DIAGNOSIS — Z95810 Presence of automatic (implantable) cardiac defibrillator: Secondary | ICD-10-CM

## 2017-12-16 DIAGNOSIS — G4730 Sleep apnea, unspecified: Secondary | ICD-10-CM | POA: Diagnosis present

## 2017-12-16 DIAGNOSIS — Z9842 Cataract extraction status, left eye: Secondary | ICD-10-CM

## 2017-12-16 DIAGNOSIS — Z7902 Long term (current) use of antithrombotics/antiplatelets: Secondary | ICD-10-CM

## 2017-12-16 DIAGNOSIS — I5022 Chronic systolic (congestive) heart failure: Secondary | ICD-10-CM | POA: Diagnosis present

## 2017-12-16 DIAGNOSIS — I252 Old myocardial infarction: Secondary | ICD-10-CM

## 2017-12-16 DIAGNOSIS — R680 Hypothermia, not associated with low environmental temperature: Secondary | ICD-10-CM | POA: Diagnosis not present

## 2017-12-16 DIAGNOSIS — J90 Pleural effusion, not elsewhere classified: Secondary | ICD-10-CM

## 2017-12-16 DIAGNOSIS — E860 Dehydration: Principal | ICD-10-CM | POA: Diagnosis present

## 2017-12-16 DIAGNOSIS — Z7982 Long term (current) use of aspirin: Secondary | ICD-10-CM

## 2017-12-16 DIAGNOSIS — I739 Peripheral vascular disease, unspecified: Secondary | ICD-10-CM | POA: Diagnosis present

## 2017-12-16 DIAGNOSIS — N189 Chronic kidney disease, unspecified: Secondary | ICD-10-CM | POA: Diagnosis present

## 2017-12-16 DIAGNOSIS — I130 Hypertensive heart and chronic kidney disease with heart failure and stage 1 through stage 4 chronic kidney disease, or unspecified chronic kidney disease: Secondary | ICD-10-CM | POA: Diagnosis not present

## 2017-12-16 DIAGNOSIS — E7800 Pure hypercholesterolemia, unspecified: Secondary | ICD-10-CM | POA: Diagnosis present

## 2017-12-16 DIAGNOSIS — N289 Disorder of kidney and ureter, unspecified: Secondary | ICD-10-CM | POA: Diagnosis not present

## 2017-12-16 DIAGNOSIS — Z951 Presence of aortocoronary bypass graft: Secondary | ICD-10-CM

## 2017-12-16 DIAGNOSIS — I872 Venous insufficiency (chronic) (peripheral): Secondary | ICD-10-CM | POA: Diagnosis present

## 2017-12-16 DIAGNOSIS — R7989 Other specified abnormal findings of blood chemistry: Secondary | ICD-10-CM | POA: Diagnosis not present

## 2017-12-16 DIAGNOSIS — J302 Other seasonal allergic rhinitis: Secondary | ICD-10-CM | POA: Diagnosis present

## 2017-12-16 DIAGNOSIS — I70235 Atherosclerosis of native arteries of right leg with ulceration of other part of foot: Secondary | ICD-10-CM | POA: Diagnosis not present

## 2017-12-16 DIAGNOSIS — E039 Hypothyroidism, unspecified: Secondary | ICD-10-CM | POA: Diagnosis present

## 2017-12-16 DIAGNOSIS — Z820 Family history of epilepsy and other diseases of the nervous system: Secondary | ICD-10-CM

## 2017-12-16 DIAGNOSIS — Z79899 Other long term (current) drug therapy: Secondary | ICD-10-CM

## 2017-12-16 DIAGNOSIS — Z79890 Hormone replacement therapy (postmenopausal): Secondary | ICD-10-CM

## 2017-12-16 DIAGNOSIS — Z888 Allergy status to other drugs, medicaments and biological substances status: Secondary | ICD-10-CM

## 2017-12-16 DIAGNOSIS — M069 Rheumatoid arthritis, unspecified: Secondary | ICD-10-CM | POA: Diagnosis present

## 2017-12-16 DIAGNOSIS — R4020 Unspecified coma: Secondary | ICD-10-CM | POA: Diagnosis not present

## 2017-12-16 DIAGNOSIS — L97512 Non-pressure chronic ulcer of other part of right foot with fat layer exposed: Secondary | ICD-10-CM | POA: Diagnosis not present

## 2017-12-16 DIAGNOSIS — E871 Hypo-osmolality and hyponatremia: Secondary | ICD-10-CM | POA: Diagnosis present

## 2017-12-16 DIAGNOSIS — I959 Hypotension, unspecified: Secondary | ICD-10-CM | POA: Diagnosis present

## 2017-12-16 DIAGNOSIS — J320 Chronic maxillary sinusitis: Secondary | ICD-10-CM | POA: Diagnosis present

## 2017-12-16 DIAGNOSIS — I255 Ischemic cardiomyopathy: Secondary | ICD-10-CM | POA: Diagnosis present

## 2017-12-16 DIAGNOSIS — Z955 Presence of coronary angioplasty implant and graft: Secondary | ICD-10-CM

## 2017-12-16 DIAGNOSIS — Z881 Allergy status to other antibiotic agents status: Secondary | ICD-10-CM

## 2017-12-16 DIAGNOSIS — L97312 Non-pressure chronic ulcer of right ankle with fat layer exposed: Secondary | ICD-10-CM | POA: Diagnosis not present

## 2017-12-16 DIAGNOSIS — Z79891 Long term (current) use of opiate analgesic: Secondary | ICD-10-CM

## 2017-12-16 LAB — URINALYSIS, COMPLETE (UACMP) WITH MICROSCOPIC
Bacteria, UA: NONE SEEN
Bilirubin Urine: NEGATIVE
GLUCOSE, UA: NEGATIVE mg/dL
HGB URINE DIPSTICK: NEGATIVE
Ketones, ur: 20 mg/dL — AB
Leukocytes, UA: NEGATIVE
Nitrite: NEGATIVE
Protein, ur: 30 mg/dL — AB
Specific Gravity, Urine: 1.023 (ref 1.005–1.030)
WBC, UA: NONE SEEN WBC/hpf (ref 0–5)
pH: 5 (ref 5.0–8.0)

## 2017-12-16 LAB — CBC WITH DIFFERENTIAL/PLATELET
Abs Immature Granulocytes: 0.03 10*3/uL (ref 0.00–0.07)
Basophils Absolute: 0 10*3/uL (ref 0.0–0.1)
Basophils Relative: 1 %
Eosinophils Absolute: 0.1 10*3/uL (ref 0.0–0.5)
Eosinophils Relative: 1 %
HCT: 35.5 % — ABNORMAL LOW (ref 39.0–52.0)
Hemoglobin: 11.5 g/dL — ABNORMAL LOW (ref 13.0–17.0)
Immature Granulocytes: 0 %
Lymphocytes Relative: 6 %
Lymphs Abs: 0.4 10*3/uL — ABNORMAL LOW (ref 0.7–4.0)
MCH: 30 pg (ref 26.0–34.0)
MCHC: 32.4 g/dL (ref 30.0–36.0)
MCV: 92.7 fL (ref 80.0–100.0)
Monocytes Absolute: 0.9 10*3/uL (ref 0.1–1.0)
Monocytes Relative: 12 %
NEUTROS ABS: 5.7 10*3/uL (ref 1.7–7.7)
Neutrophils Relative %: 80 %
Platelets: 199 10*3/uL (ref 150–400)
RBC: 3.83 MIL/uL — ABNORMAL LOW (ref 4.22–5.81)
RDW: 14.7 % (ref 11.5–15.5)
WBC: 7.2 10*3/uL (ref 4.0–10.5)
nRBC: 0 % (ref 0.0–0.2)

## 2017-12-16 LAB — CG4 I-STAT (LACTIC ACID): Lactic Acid, Venous: 2.09 mmol/L (ref 0.5–1.9)

## 2017-12-16 LAB — COMPREHENSIVE METABOLIC PANEL
ALT: 81 U/L — ABNORMAL HIGH (ref 0–44)
AST: 161 U/L — ABNORMAL HIGH (ref 15–41)
Albumin: 4 g/dL (ref 3.5–5.0)
Alkaline Phosphatase: 113 U/L (ref 38–126)
Anion gap: 12 (ref 5–15)
BUN: 36 mg/dL — ABNORMAL HIGH (ref 8–23)
CO2: 22 mmol/L (ref 22–32)
Calcium: 9.1 mg/dL (ref 8.9–10.3)
Chloride: 94 mmol/L — ABNORMAL LOW (ref 98–111)
Creatinine, Ser: 1.58 mg/dL — ABNORMAL HIGH (ref 0.61–1.24)
GFR calc Af Amer: 48 mL/min — ABNORMAL LOW (ref >60)
GFR calc non Af Amer: 42 mL/min — ABNORMAL LOW (ref >60)
Glucose, Bld: 87 mg/dL (ref 70–99)
Potassium: 4.6 mmol/L (ref 3.5–5.1)
Sodium: 128 mmol/L — ABNORMAL LOW (ref 135–145)
Total Bilirubin: 1.2 mg/dL (ref 0.3–1.2)
Total Protein: 7.3 g/dL (ref 6.5–8.1)

## 2017-12-16 LAB — TROPONIN I: Troponin I: 0.09 ng/mL (ref <0.03)

## 2017-12-16 LAB — MAGNESIUM: Magnesium: 2.4 mg/dL (ref 1.7–2.4)

## 2017-12-16 MED ORDER — ASPIRIN EC 81 MG PO TBEC
81.0000 mg | DELAYED_RELEASE_TABLET | Freq: Every day | ORAL | Status: DC
  Administered 2017-12-17 – 2017-12-18 (×2): 81 mg via ORAL
  Filled 2017-12-16 (×2): qty 1

## 2017-12-16 MED ORDER — OXYCODONE-ACETAMINOPHEN 5-325 MG PO TABS
1.0000 | ORAL_TABLET | Freq: Once | ORAL | Status: AC
  Administered 2017-12-16: 1 via ORAL
  Filled 2017-12-16: qty 1

## 2017-12-16 MED ORDER — GENERIC EXTERNAL MEDICATION
400.00 | Status: DC
Start: 2017-12-16 — End: 2017-12-16

## 2017-12-16 MED ORDER — MEXILETINE HCL 150 MG PO CAPS
150.0000 mg | ORAL_CAPSULE | Freq: Three times a day (TID) | ORAL | Status: DC
  Administered 2017-12-16 – 2017-12-18 (×5): 150 mg via ORAL
  Filled 2017-12-16 (×8): qty 1

## 2017-12-16 MED ORDER — TRAMADOL HCL 50 MG PO TABS
50.00 | ORAL_TABLET | ORAL | Status: DC
Start: ? — End: 2017-12-16

## 2017-12-16 MED ORDER — ALBUTEROL SULFATE (2.5 MG/3ML) 0.083% IN NEBU: 2.5000 mg | INHALATION_SOLUTION | RESPIRATORY_TRACT | Status: DC | PRN

## 2017-12-16 MED ORDER — VITAMIN B-12 1000 MCG PO TABS
500.00 | ORAL_TABLET | ORAL | Status: DC
Start: 2017-12-16 — End: 2017-12-16

## 2017-12-16 MED ORDER — LEVOTHYROXINE SODIUM 25 MCG PO TABS
75.00 | ORAL_TABLET | ORAL | Status: DC
Start: 2017-12-16 — End: 2017-12-16

## 2017-12-16 MED ORDER — ONDANSETRON HCL 4 MG/2ML IJ SOLN: 4.0000 mg | Freq: Four times a day (QID) | INTRAMUSCULAR | Status: DC | PRN

## 2017-12-16 MED ORDER — SENNOSIDES-DOCUSATE SODIUM 8.6-50 MG PO TABS: 1.0000 | ORAL_TABLET | Freq: Every evening | ORAL | Status: DC | PRN

## 2017-12-16 MED ORDER — MEXILETINE HCL 150 MG PO CAPS
150.00 | ORAL_CAPSULE | ORAL | Status: DC
Start: 2017-12-15 — End: 2017-12-16

## 2017-12-16 MED ORDER — HEPARIN SODIUM (PORCINE) 5000 UNIT/ML IJ SOLN
5000.00 | INTRAMUSCULAR | Status: DC
Start: 2017-12-15 — End: 2017-12-16

## 2017-12-16 MED ORDER — AMIODARONE HCL 100 MG PO TABS
100.0000 mg | ORAL_TABLET | Freq: Every day | ORAL | Status: DC
  Administered 2017-12-16 – 2017-12-17 (×2): 100 mg via ORAL
  Filled 2017-12-16 (×3): qty 1

## 2017-12-16 MED ORDER — ACETAMINOPHEN 325 MG PO TABS
650.0000 mg | ORAL_TABLET | Freq: Four times a day (QID) | ORAL | Status: DC | PRN
  Administered 2017-12-17 – 2017-12-18 (×2): 650 mg via ORAL
  Filled 2017-12-16 (×2): qty 2

## 2017-12-16 MED ORDER — CLOPIDOGREL BISULFATE 75 MG PO TABS
75.0000 mg | ORAL_TABLET | Freq: Every day | ORAL | Status: DC
  Administered 2017-12-17 – 2017-12-18 (×2): 75 mg via ORAL
  Filled 2017-12-16 (×2): qty 1

## 2017-12-16 MED ORDER — LEFLUNOMIDE 10 MG PO TABS
10.00 | ORAL_TABLET | ORAL | Status: DC
Start: 2017-12-16 — End: 2017-12-16

## 2017-12-16 MED ORDER — SODIUM CHLORIDE 0.9% FLUSH: 3.0000 mL | INTRAVENOUS | Status: DC | PRN

## 2017-12-16 MED ORDER — HYDROCODONE-ACETAMINOPHEN 5-325 MG PO TABS
1.0000 | ORAL_TABLET | ORAL | Status: DC | PRN
  Administered 2017-12-17 – 2017-12-18 (×2): 1 via ORAL
  Filled 2017-12-16 (×2): qty 1

## 2017-12-16 MED ORDER — ONDANSETRON HCL 4 MG PO TABS: 4.0000 mg | ORAL_TABLET | Freq: Four times a day (QID) | ORAL | Status: DC | PRN

## 2017-12-16 MED ORDER — ACETAMINOPHEN 650 MG RE SUPP: 650.0000 mg | Freq: Four times a day (QID) | RECTAL | Status: DC | PRN

## 2017-12-16 MED ORDER — SODIUM CHLORIDE 0.9% FLUSH
3.0000 mL | Freq: Two times a day (BID) | INTRAVENOUS | Status: DC
  Administered 2017-12-16 – 2017-12-18 (×4): 3 mL via INTRAVENOUS

## 2017-12-16 MED ORDER — NITROGLYCERIN 0.4 MG SL SUBL: 0.4000 mg | SUBLINGUAL_TABLET | SUBLINGUAL | Status: DC | PRN

## 2017-12-16 MED ORDER — ASPIRIN EC 81 MG PO TBEC
81.00 | DELAYED_RELEASE_TABLET | ORAL | Status: DC
Start: 2017-12-16 — End: 2017-12-16

## 2017-12-16 MED ORDER — ACETAMINOPHEN 325 MG PO TABS
650.00 | ORAL_TABLET | ORAL | Status: DC
Start: ? — End: 2017-12-16

## 2017-12-16 MED ORDER — PNEUMOCOCCAL VAC POLYVALENT 25 MCG/0.5ML IJ INJ
0.50 | INJECTION | INTRAMUSCULAR | Status: DC
Start: ? — End: 2017-12-16

## 2017-12-16 MED ORDER — POTASSIUM CHLORIDE CRYS ER 10 MEQ PO TBCR
10.00 | EXTENDED_RELEASE_TABLET | ORAL | Status: DC
Start: 2017-12-16 — End: 2017-12-16

## 2017-12-16 MED ORDER — LORATADINE 10 MG PO TABS
10.0000 mg | ORAL_TABLET | Freq: Every day | ORAL | Status: DC
  Administered 2017-12-17 – 2017-12-18 (×2): 10 mg via ORAL
  Filled 2017-12-16 (×2): qty 1

## 2017-12-16 MED ORDER — AMIODARONE HCL 200 MG PO TABS
100.00 | ORAL_TABLET | ORAL | Status: DC
Start: 2017-12-15 — End: 2017-12-16

## 2017-12-16 MED ORDER — LEFLUNOMIDE 20 MG PO TABS
10.0000 mg | ORAL_TABLET | Freq: Every day | ORAL | Status: DC
  Administered 2017-12-17 – 2017-12-18 (×2): 10 mg via ORAL
  Filled 2017-12-16 (×3): qty 0.5

## 2017-12-16 MED ORDER — HEPARIN SODIUM (PORCINE) 5000 UNIT/ML IJ SOLN
5000.0000 [IU] | Freq: Three times a day (TID) | INTRAMUSCULAR | Status: DC
  Administered 2017-12-16 – 2017-12-18 (×5): 5000 [IU] via SUBCUTANEOUS
  Filled 2017-12-16 (×6): qty 1

## 2017-12-16 MED ORDER — LEVOTHYROXINE SODIUM 50 MCG PO TABS
50.0000 ug | ORAL_TABLET | Freq: Every day | ORAL | Status: DC
  Administered 2017-12-17 – 2017-12-18 (×2): 50 ug via ORAL
  Filled 2017-12-16 (×2): qty 1

## 2017-12-16 MED ORDER — CLOPIDOGREL BISULFATE 75 MG PO TABS
75.00 | ORAL_TABLET | ORAL | Status: DC
Start: 2017-12-16 — End: 2017-12-16

## 2017-12-16 MED ORDER — BISACODYL 5 MG PO TBEC
5.0000 mg | DELAYED_RELEASE_TABLET | Freq: Every day | ORAL | Status: DC | PRN
  Filled 2017-12-16: qty 1

## 2017-12-16 MED ORDER — VITAMIN D 25 MCG (1000 UNIT) PO TABS
2000.0000 [IU] | ORAL_TABLET | Freq: Every day | ORAL | Status: DC
  Administered 2017-12-17 – 2017-12-18 (×2): 2000 [IU] via ORAL
  Filled 2017-12-16 (×2): qty 2

## 2017-12-16 MED ORDER — VITAMIN B-12 1000 MCG PO TABS
1000.0000 ug | ORAL_TABLET | Freq: Every day | ORAL | Status: DC
  Administered 2017-12-17 – 2017-12-18 (×2): 1000 ug via ORAL
  Filled 2017-12-16 (×2): qty 1

## 2017-12-16 MED ORDER — DOCUSATE SODIUM 100 MG PO CAPS
100.00 | ORAL_CAPSULE | ORAL | Status: DC
Start: 2017-12-15 — End: 2017-12-16

## 2017-12-16 MED ORDER — SODIUM CHLORIDE 0.9 % IV SOLN: 250.0000 mL | INTRAVENOUS | Status: DC | PRN

## 2017-12-16 NOTE — Care Management Obs Status (Signed)
[facility] NOTIFICATION   Patient Details  Name: [name] MRN: [id number] Date of Birth: 04/19/1940   Medicare Observation Status Notification Given:  Yes    Shelbie Hutching, RN 12/16/2017, 4:59 PM

## 2017-12-16 NOTE — ED Notes (Signed)
Dr Jimmye Norman notified of elevated troponin 0.09 and elevated lactic of 2.09 Awaiting new orders

## 2017-12-16 NOTE — ED Notes (Signed)
ISTAT lactic acid preformed. MD Jimmye Norman made aware of critical result of 2.09. Teresa,RN made aware as well.

## 2017-12-16 NOTE — Care Management Note (Signed)
Case Management Note  Patient Details  Name: [name] MRN: [id number] Date of Birth: 17-Feb-1940  Subjective/Objective:    Patient is being placed in observation for weakness.  Patient is from home lives with his wife in [place].  Patient reports independent in ADL's, he has a walker and a cane but has been using the walker more lately because he has been so weak.  Patient is able to drive and so can his wife. PCP verified as Dr. Tobie Lords in [place], pharmacy is [facility] in [place].  Patient states he gets home health nursing for wound care M,W,F with [facility].  Sharmon Revere with [facility] notified of pt status.    RNCM will follow for any discharge planning needs.              Action/Plan:   Expected Discharge Date:                  Expected Discharge Plan:     In-House Referral:     Discharge planning Services     Post Acute Care Choice:    Choice offered to:     DME Arranged:    DME Agency:     HH Arranged:    HH Agency:     Status of Service:     If discussed at [employer] of Stay Meetings, dates discussed:    Additional Comments:  Shelbie Hutching, RN 12/16/2017, 5:02 PM

## 2017-12-16 NOTE — ED Notes (Signed)
Called lab again to come and draw blood cultures

## 2017-12-16 NOTE — ED Provider Notes (Signed)
[facility] Emergency Department Provider Note       Time seen: ----------------------------------------- 9:59 AM on 12/16/2017 -----------------------------------------   I have reviewed the triage vital signs and the nursing notes.  HISTORY   Chief Complaint Weakness    HPI [name] is a 77 y.o. male with a history of congestive heart failure, collagen vascular disease, coronary artery disease, peripheral vascular disease, MI who presents to the ED for weakness.  Patient was sent from the wound clinic for general lethargy.  He was noted to be hypotensive and hypothermic.  He was seen recently at an outside hospital 2 days ago and was treated for dehydration.  He was thought to be dehydrated relative to recent diuresis for heart failure.  Family states the wounds on his right foot are improving.  Past Medical History:  Diagnosis Date  . AICD (automatic cardioverter/defibrillator) present    [employer]  . Arthritis    RA  . Balance problem   . CHF (congestive heart failure) ([place])   . Collagen vascular disease ([place])    RA since 2014  . Complication of anesthesia    slow to wake up  . Coronary artery disease   . Deviated nasal septum   . Dysrhythmia    v tach  . Hearing loss    TINNITUS  . High cholesterol   . Hypothyroidism   . Leg weakness   . Myocardial infarction ([place])    x 2  . Peripheral vascular disease ([place])   . Presence of permanent cardiac pacemaker    WITH DEFIBRILATOR ([employer])  . Seasonal allergies    RHINITIS  . Shortness of breath dyspnea    COUGH  . Sinus headache   . Sinusitis    CHRONIC  . Sleep apnea   . Vertigo    DYSEQUILIBRIUM    Patient Active Problem List   Diagnosis Date Noted  . PAD (peripheral artery disease) ([place]) 08/11/2017  . Chronic venous insufficiency 06/04/2017  . Lymphedema 06/04/2017  . Bilateral lower extremity edema 04/01/2017  . Ulcer of right lower extremity with fat  layer exposed ([place]) 04/01/2017  . Hyponatremia 02/19/2017  . Non-STEMI (non-ST elevated myocardial infarction) ([place]) 12/02/2016  . V-tach ([place]) 08/20/2014  . Hyperthyroidism 08/20/2014  . Carotid stenosis 08/20/2014  . Dyspepsia 08/20/2014  . Malnutrition of moderate degree ([place]) 08/19/2014  . Syncope and collapse 08/18/2014  . Chest pain 07/15/2014  . CAD (coronary artery disease) 07/15/2014  . Fall at home 07/15/2014  . Nausea and vomiting 07/15/2014  . Nausea & vomiting 07/15/2014  . Abnormality of gait 12/13/2013  . Low back pain 12/13/2013  . Arthritis   . Sinus headache   . Injury of kidney 12/02/2013  . Arteriosclerosis of coronary artery 12/02/2013  . Body water dehydration 12/02/2013  . Sinus infection 12/02/2013  . Idiopathic ventricular tachycardia ([place]) 09/14/2012  . Heart failure, systolic ([place]) 93/79/0240  . Automatic implantable cardioverter-defibrillator in situ 07/26/2012  . Acute MI, subendocardial ([place]) 07/12/2012  . Essential (primary) hypertension 07/12/2012  . HLD (hyperlipidemia) 07/12/2012  . BP (high blood pressure) 07/12/2012  . Acute non-ST segment elevation myocardial infarction ([place]) 07/12/2012  . Arthritis or polyarthritis, rheumatoid ([place]) 07/12/2012    Past Surgical History:  Procedure Laterality Date  . CARDIAC ELECTROPHYSIOLOGY STUDY AND ABLATION    . CARDIAC SURGERY    . CATARACT EXTRACTION     BILATERAL  . CORONARY ANGIOPLASTY     STENTS  . CORONARY ARTERY  BYPASS GRAFT  1992  . heart pump    . INSERT / REPLACE / REMOVE PACEMAKER    . LOWER EXTREMITY ANGIOGRAPHY Right 07/06/2017   Procedure: LOWER EXTREMITY ANGIOGRAPHY;  Surgeon: Algernon Huxley, MD;  Location: [facility];  Service: Cardiovascular;  Laterality: Right;  . LOWER EXTREMITY ANGIOGRAPHY Left 11/02/2017   Procedure: LOWER EXTREMITY ANGIOGRAPHY;  Surgeon: Algernon Huxley, MD;  Location: [facility];  Service: Cardiovascular;  Laterality: Left;  . SEPTOPLASTY       Allergies Bactrim [sulfamethoxazole-trimethoprim]; Doxycycline; and Pravastatin  Social History Social History   Tobacco Use  . Smoking status: Never Smoker  . Smokeless tobacco: Never Used  Substance Use Topics  . Alcohol use: No    Alcohol/week: 0.0 standard drinks  . Drug use: No   Review of Systems Constitutional: Negative for fever. Cardiovascular: Negative for chest pain. Respiratory: Negative for shortness of breath. Gastrointestinal: Negative for abdominal pain, vomiting and diarrhea. Musculoskeletal: Negative for back pain. Skin: Positive for right foot wounds Neurological: Negative for headaches, positive for generalized weakness  All systems negative/normal/unremarkable except as stated in the HPI  ____________________________________________   PHYSICAL EXAM:  VITAL SIGNS: ED Triage Vitals  Enc Vitals Group     BP 12/16/17 0958 103/71     Pulse Rate 12/16/17 0958 83     Resp 12/16/17 0958 14     Temp 12/16/17 0958 (!) 93.6 F (34.2 C)     Temp Source 12/16/17 0958 Oral     SpO2 --      Weight 12/16/17 0948 164 lb 7.4 oz (74.6 kg)     Height --      Head Circumference --      Peak Flow --      Pain Score 12/16/17 0948 0     Pain Loc --      Pain Edu? --      Excl. in [place]? --    Constitutional: Alert and oriented.  Lethargic, no distress Eyes: Conjunctivae are normal. Normal extraocular movements. ENT   Head: Normocephalic and atraumatic.   Nose: No congestion/rhinnorhea.   Mouth/Throat: Mucous membranes are moist.   Neck: No stridor. Cardiovascular: Normal rate, regular rhythm. No murmurs, rubs, or gallops.  Diminished pulses in the right foot which according to the wife are chronic Respiratory: Normal respiratory effort without tachypnea nor retractions. Breath sounds are clear and equal bilaterally. No wheezes/rales/rhonchi. Gastrointestinal: Soft and nontender. Normal bowel sounds Musculoskeletal: Nontender with normal range of  motion in extremities. No lower extremity tenderness nor edema. Neurologic:  Normal speech and language. No gross focal neurologic deficits are appreciated.  Generalized weakness, nothing focal Skin: Healing wounds are noted to the lateral aspect of the right foot dorsally Psychiatric: Mood and affect are normal. Speech and behavior are normal.  ____________________________________________  EKG: Interpreted by me.  Sinus rhythm rate 83 bpm, right bundle branch block, normal axis.  Long QT  ____________________________________________  ED COURSE:  As part of my medical decision making, I reviewed the following data within the [place] History obtained from family if available, nursing notes, old chart and ekg, as well as notes from prior ED visits. Patient presented for weakness, we will assess with labs and imaging as indicated at this time.   Procedures ____________________________________________   LABS (pertinent positives/negatives)  Labs Reviewed  CBC WITH DIFFERENTIAL/PLATELET - Abnormal; Notable for the following components:      Result Value   RBC 3.83 (*)  Hemoglobin 11.5 (*)    HCT 35.5 (*)    Lymphs Abs 0.4 (*)    All other components within normal limits  COMPREHENSIVE METABOLIC PANEL - Abnormal; Notable for the following components:   Sodium 128 (*)    Chloride 94 (*)    BUN 36 (*)    Creatinine, Ser 1.58 (*)    AST 161 (*)    ALT 81 (*)    GFR calc non Af Amer 42 (*)    GFR calc Af Amer 48 (*)    All other components within normal limits  TROPONIN I - Abnormal; Notable for the following components:   Troponin I 0.09 (*)    All other components within normal limits  URINALYSIS, COMPLETE (UACMP) WITH MICROSCOPIC - Abnormal; Notable for the following components:   Color, Urine YELLOW (*)    APPearance HAZY (*)    Ketones, ur 20 (*)    Protein, ur 30 (*)    All other components within normal limits  CG4 I-STAT (LACTIC ACID) - Abnormal;  Notable for the following components:   Lactic Acid, Venous 2.09 (*)    All other components within normal limits  CULTURE, BLOOD (ROUTINE X 2)  CULTURE, BLOOD (ROUTINE X 2)  I-STAT CG4 LACTIC ACID, ED  CBG MONITORING, ED    RADIOLOGY Images were viewed by me  CT head, chest x-ray IMPRESSION: Senescent changes without acute intracranial finding. IMPRESSION: Marked increase in a right pleural effusion. Small left effusion is unchanged.  Cardiomegaly and interstitial edema.  Bibasilar airspace disease is likely due to compressive atelectasis from the patient's effusions.  ____________________________________________  DIFFERENTIAL DIAGNOSIS   Dehydration, electrolyte abnormality, occult infection, sepsis, MI, CVA  FINAL ASSESSMENT AND PLAN  Weakness, pleural effusion, chronically elevated troponin, mild renal insufficiency   Plan: The patient had presented for generalized weakness. Patient's labs reveal several abnormalities of uncertain significance. Patient's imaging did not reveal any acute process. Overall patient appears weak of unclear exact etiology, I will discuss with the hospitalist for admission.   Laurence Aly, MD   Note: This note was generated in part or whole with voice recognition software. Voice recognition is usually quite accurate but there are transcription errors that can and very often do occur. I apologize for any typographical errors that were not detected and corrected.     Earleen Newport, MD 12/16/17 [phone]

## 2017-12-16 NOTE — ED Notes (Signed)
Report called to chris rn floor nurse 

## 2017-12-16 NOTE — ED Notes (Signed)
Unable to obtain blood for cultures x1 attempt Helene Kelp RN x2 attempt Medic Student x1 attempt Colletta Maryland RN Lab to be called to obtain

## 2017-12-16 NOTE — ED Triage Notes (Signed)
Sent to ED from [place].  Per report patient has known wounds to foot and ankle, but presented to wound center today, hypotensive and hypo thermic.  Seen through [facility] ED 2 days ago, and wound center MD believes patient is in heart failure.  Patient is AAOx3.  Skin pale and cool to touch.  No SOB/ DOE.

## 2017-12-16 NOTE — ED Notes (Signed)
Blood cultures drawn by lab

## 2017-12-16 NOTE — Progress Notes (Signed)
Advanced Care Plan.  Purpose of Encounter: CODE STATUS. Parties in Attendance: The patient and me. Patient's Decisional Capacity: Yes. Medical Story: [name]  is a 77 y.o. male with a known history of multiple medical problem including CHF, CAD, s/p S AICD, PVD, sleep apnea and etc. the patient is admitted for generalized weakness due to dehydration and hypotension.  I discussed with patient about his current condition, prognosis and CODE STATUS.  The patient cannot decided full code or DNR.  He want to talk with his wife then decide.  He agreed to put on full code for now. Plan:  Code Status: Full code. Time spent discussing advance care planning: 17 minutes.

## 2017-12-16 NOTE — H&P (Signed)
[place] [place] [facility] [place] NAME: [name]    MR#:  [id number]  DATE OF BIRTH:  11/06/1940  DATE OF ADMISSION:  12/16/2017  PRIMARY CARE PHYSICIAN: Cyndi Bender, PA-C   REQUESTING/REFERRING PHYSICIAN: Dr. Jimmye Norman  CHIEF COMPLAINT:   Chief Complaint  Patient presents with  . Weakness   Generalized weakness HISTORY OF PRESENT ILLNESS:  [name]  is a 77 y.o. male with a known history of multiple medical problem including CHF, CAD, s/p S AICD, PVD, sleep apnea and etc. the patient presents the ED with above chief complaints.  The patient has had generalized weakness and poor oral intake for several days.  He was sent from wound clinic for generalized lethargy.  He was noted hypotensive and hypothermic.  The patient denies any other symptoms.  He was just discharged from another hospital to home 2 days ago after treatment for dehydration.  His right foot wound is stable.  Dr. Jimmye Norman requested admission. PAST MEDICAL HISTORY:   Past Medical History:  Diagnosis Date  . AICD (automatic cardioverter/defibrillator) present    [employer]  . Arthritis    RA  . Balance problem   . CHF (congestive heart failure) ([place])   . Collagen vascular disease ([place])    RA since 2014  . Complication of anesthesia    slow to wake up  . Coronary artery disease   . Deviated nasal septum   . Dysrhythmia    v tach  . Hearing loss    TINNITUS  . High cholesterol   . Hypothyroidism   . Leg weakness   . Myocardial infarction ([place])    x 2  . Peripheral vascular disease ([place])   . Presence of permanent cardiac pacemaker    WITH DEFIBRILATOR ([employer])  . Seasonal allergies    RHINITIS  . Shortness of breath dyspnea    COUGH  . Sinus headache   . Sinusitis    CHRONIC  . Sleep apnea   . Vertigo    DYSEQUILIBRIUM    PAST SURGICAL HISTORY:   Past Surgical History:  Procedure Laterality Date  . CARDIAC ELECTROPHYSIOLOGY  STUDY AND ABLATION    . CARDIAC SURGERY    . CATARACT EXTRACTION     BILATERAL  . CORONARY ANGIOPLASTY     STENTS  . CORONARY ARTERY BYPASS GRAFT  1992  . heart pump    . INSERT / REPLACE / REMOVE PACEMAKER    . LOWER EXTREMITY ANGIOGRAPHY Right 07/06/2017   Procedure: LOWER EXTREMITY ANGIOGRAPHY;  Surgeon: Algernon Huxley, MD;  Location: [facility];  Service: Cardiovascular;  Laterality: Right;  . LOWER EXTREMITY ANGIOGRAPHY Left 11/02/2017   Procedure: LOWER EXTREMITY ANGIOGRAPHY;  Surgeon: Algernon Huxley, MD;  Location: [facility];  Service: Cardiovascular;  Laterality: Left;  . SEPTOPLASTY      SOCIAL HISTORY:   Social History   Tobacco Use  . Smoking status: Never Smoker  . Smokeless tobacco: Never Used  Substance Use Topics  . Alcohol use: No    Alcohol/week: 0.0 standard drinks    FAMILY HISTORY:   Family History  Problem Relation Age of Onset  . Alzheimer's disease Mother     DRUG ALLERGIES:   Allergies  Allergen Reactions  . Bactrim [Sulfamethoxazole-Trimethoprim] Other (See Comments)    Severe dehydration  . Doxycycline Other (See Comments)    sunburn  . Pravastatin Other (See Comments)    Reaction:  Joint  pain     REVIEW OF SYSTEMS:   Review of Systems  Constitutional: Positive for malaise/fatigue. Negative for chills and fever.       Generalized weakness and poor oral intake.  HENT: Negative for sore throat.   Eyes: Negative for blurred vision and double vision.  Respiratory: Negative for cough, hemoptysis, shortness of breath, wheezing and stridor.   Cardiovascular: Negative for chest pain, palpitations, orthopnea and leg swelling.  Gastrointestinal: Negative for abdominal pain, blood in stool, diarrhea, melena, nausea and vomiting.  Genitourinary: Negative for dysuria, flank pain and hematuria.  Musculoskeletal: Negative for back pain and joint pain.  Skin: Negative for rash.  Neurological: Negative for dizziness, sensory change,  focal weakness, seizures, loss of consciousness, weakness and headaches.  Endo/Heme/Allergies: Negative for polydipsia.  Psychiatric/Behavioral: Negative for depression. The patient is not nervous/anxious.     MEDICATIONS AT HOME:   Prior to Admission medications   Medication Sig Start Date End Date Taking? Authorizing Provider  acetaminophen (TYLENOL 8 HOUR ARTHRITIS PAIN) 650 MG CR tablet Take 650-1,300 mg by mouth every 8 (eight) hours as needed (pain/headaches.).   Yes [provider]  amiodarone (PACERONE) 200 MG tablet Take 100 mg by mouth at bedtime.    Yes [provider]  aspirin EC 81 MG tablet Take 81 mg by mouth daily.   Yes [provider]  cetirizine (ZYRTEC) 10 MG tablet Take 10 mg by mouth daily.   Yes [provider]  Cholecalciferol (VITAMIN D3) 2000 UNITS TABS Take 2,000 Units by mouth daily.    Yes [provider]  clopidogrel (PLAVIX) 75 MG tablet Take 1 tablet (75 mg total) by mouth daily. 07/06/17  Yes Dew, Erskine Squibb, MD  docusate sodium (COLACE) 100 MG capsule Take 100-200 mg by mouth daily as needed (for constipation.).    Yes [provider]  furosemide (LASIX) 40 MG tablet Take 20 mg by mouth daily as needed for edema (for fluid retention.).    Yes [provider]  leflunomide (ARAVA) 10 MG tablet Take 10 mg by mouth daily. 05/28/17  Yes [provider]  levothyroxine (SYNTHROID, LEVOTHROID) 50 MCG tablet Take 50 mcg by mouth daily before breakfast.  02/13/17  Yes [provider]  mexiletine (MEXITIL) 150 MG capsule Take 150 mg by mouth every 8 (eight) hours.    Yes [provider]  nitroGLYCERIN (NITROSTAT) 0.4 MG SL tablet Place 0.4 mg under the tongue every 5 (five) minutes as needed for chest pain.   Yes [provider]  Polyethylene Glycol 3350 (PEG 3350) POWD Take 17 g by mouth daily as needed (for constipation.).    Yes [provider]  potassium chloride  (K-DUR) 10 MEQ tablet Take 10 mEq by mouth daily.  03/24/17 03/24/18 Yes [provider]  SANTYL ointment Apply 1 application topically 3 (three) times a week. Apply a "nickel thick" amount to wound as directed 06/04/17  Yes [provider]  vitamin B-12 (CYANOCOBALAMIN) 1000 MCG tablet Take 1,000 mcg by mouth daily.   Yes [provider]  atorvastatin (LIPITOR) 10 MG tablet Take 1 tablet (10 mg total) by mouth daily. Patient not taking: Reported on 12/16/2017 11/02/17 11/02/18  Algernon Huxley, MD  doxycycline (VIBRAMYCIN) 100 MG capsule TAKE 1 CAPSULE BY MOUTH TWICE DAILY FOR PNEUMONIA 11/27/17   [provider]  ENTRESTO 24-26 MG Take 1 tablet by mouth every 12 (twelve) hours. 05/28/17   [provider]  metoprolol succinate (TOPROL-XL) 25 MG  24 hr tablet Take 25 mg by mouth 2 (two) times daily.  12/03/13   [provider]  traMADol (ULTRAM) 50 MG tablet Take 1 tablet by mouth 3 (three) times daily as needed (for pan.).  02/18/17   [provider]      VITAL SIGNS:  Blood pressure 98/71, pulse 80, temperature (!) 96.7 F (35.9 C), temperature source Rectal, resp. rate 13, weight 74.6 kg, SpO2 100 %.  PHYSICAL EXAMINATION:  Physical Exam  GENERAL:  77 y.o.-year-old patient lying in the bed with no acute distress.  EYES: Pupils equal, round, reactive to light and accommodation. No scleral icterus. Extraocular muscles intact.  HEENT: Head atraumatic, normocephalic. Oropharynx and nasopharynx clear.  NECK:  Supple, no jugular venous distention. No thyroid enlargement, no tenderness.  LUNGS: Normal breath sounds bilaterally, no wheezing, rales,rhonchi or crepitation. No use of accessory muscles of respiration.  CARDIOVASCULAR: S1, S2 normal. No murmurs, rubs, or gallops.  ABDOMEN: Soft, nontender, nondistended. Bowel sounds present. No organomegaly or mass.  EXTREMITIES: No cyanosis, or clubbing.  Bilateral leg edema 1+.  Right foot wound  in dressing. NEUROLOGIC: Cranial nerves II through XII are intact. Muscle strength 5/5 in all extremities. Sensation intact. Gait not checked.  PSYCHIATRIC: The patient is alert and oriented x 3.  SKIN: No obvious rash, lesion, or ulcer.   LABORATORY PANEL:   CBC Recent Labs  Lab 12/16/17 1023  WBC 7.2  HGB 11.5*  HCT 35.5*  PLT 199   ------------------------------------------------------------------------------------------------------------------  Chemistries  Recent Labs  Lab 12/16/17 1023  NA 128*  K 4.6  CL 94*  CO2 22  GLUCOSE 87  BUN 36*  CREATININE 1.58*  CALCIUM 9.1  AST 161*  ALT 81*  ALKPHOS 113  BILITOT 1.2   ------------------------------------------------------------------------------------------------------------------  Cardiac Enzymes Recent Labs  Lab 12/16/17 1023  TROPONINI 0.09*   ------------------------------------------------------------------------------------------------------------------  RADIOLOGY:  Dg Chest 1 View  Result Date: 12/16/2017 CLINICAL DATA:  Patient presented to the wound center today with hypotension hypothermia. EXAM: CHEST  1 VIEW COMPARISON:  PA and lateral chest 12/14/2017. FINDINGS: Again seen is marked cardiomegaly. Large right pleural effusion has markedly increased since the prior examination. Small left pleural effusion is unchanged. There is interstitial pulmonary edema. Basilar atelectasis is noted. The patient is status post CABG with an AICD in place. IMPRESSION: Marked increase in a right pleural effusion. Small left effusion is unchanged. Cardiomegaly and interstitial edema. Bibasilar airspace disease is likely due to compressive atelectasis from the patient's effusions. Electronically Signed   By: Inge Rise M.D.   On: 12/16/2017 11:19   Ct Head Wo Contrast  Result Date: 12/16/2017 CLINICAL DATA:  Altered level of consciousness, unexplained EXAM: CT HEAD WITHOUT CONTRAST TECHNIQUE: Contiguous axial  images were obtained from the base of the skull through the vertex without intravenous contrast. COMPARISON:  12/01/2016 FINDINGS: Brain: No evidence of acute infarction, hemorrhage, hydrocephalus, extra-axial collection or mass lesion/mass effect. Cerebral volume loss that is mild for age. Mild to moderate chronic small vessel ischemia in the cerebral white matter Vascular: Extensive atherosclerotic calcification. No hyperdense vessel. Skull: No acute or aggressive finding Sinuses/Orbits: No acute finding. Chronic right maxillary sinusitis with calcified debris and sclerotic wall thickening. Bilateral cataract resection IMPRESSION: Senescent changes without acute intracranial finding. Electronically Signed   By: Monte Fantasia M.D.   On: 12/16/2017 11:28      IMPRESSION AND PLAN:   Generalized weakness due to dehydration and low blood pressure. The patient will be placed for  observation. Hold Lasix, Lopressor and Entresto.  Chronic systolic CHF with ejection fraction 25% Hold Lasix, Lopressor and Entresto, continue amiodarone, follow-up cardiology consult.  Lactic acidosis, mild, follow-up lactic acid level. Chronic hyponatremia.  Hold Lasix for now.  Follow-up BMP. Chronic elevated troponin.  All the records are reviewed and case discussed with ED provider. Management plans discussed with the patient, family and they are in agreement.  CODE STATUS: Full code  TOTAL TIME TAKING CARE OF THIS PATIENT: 35 minutes.    Demetrios Loll M.D on 12/16/2017 at 1:54 PM  Between 7am to 6pm - Pager - [phone]  After 6pm go to [email or web address] - password [email or web address] [facility]  [employer] [place] [facility]  Office  [phone]  CC: Primary care physician; Cyndi Bender, PA-C   Note: This dictation was prepared with Dragon dictation along with smaller phrase technology. Any transcriptional errors that result from this process are unin

## 2017-12-16 NOTE — ED Notes (Signed)
Resumed care from teresa rn.  Pt alert.  Iv in place.  Family with pt.  Pt waiting on admission.

## 2017-12-16 NOTE — ED Notes (Signed)
Urine specimen collected with in/out cath using aseptic technique. Pt tolerated procedure well.

## 2017-12-17 DIAGNOSIS — E7800 Pure hypercholesterolemia, unspecified: Secondary | ICD-10-CM | POA: Diagnosis present

## 2017-12-17 DIAGNOSIS — Z9842 Cataract extraction status, left eye: Secondary | ICD-10-CM | POA: Diagnosis not present

## 2017-12-17 DIAGNOSIS — Z951 Presence of aortocoronary bypass graft: Secondary | ICD-10-CM | POA: Diagnosis not present

## 2017-12-17 DIAGNOSIS — R7989 Other specified abnormal findings of blood chemistry: Secondary | ICD-10-CM | POA: Diagnosis present

## 2017-12-17 DIAGNOSIS — Z955 Presence of coronary angioplasty implant and graft: Secondary | ICD-10-CM | POA: Diagnosis not present

## 2017-12-17 DIAGNOSIS — I872 Venous insufficiency (chronic) (peripheral): Secondary | ICD-10-CM | POA: Diagnosis present

## 2017-12-17 DIAGNOSIS — I509 Heart failure, unspecified: Secondary | ICD-10-CM | POA: Diagnosis not present

## 2017-12-17 DIAGNOSIS — I5022 Chronic systolic (congestive) heart failure: Secondary | ICD-10-CM | POA: Diagnosis present

## 2017-12-17 DIAGNOSIS — I255 Ischemic cardiomyopathy: Secondary | ICD-10-CM | POA: Diagnosis present

## 2017-12-17 DIAGNOSIS — I130 Hypertensive heart and chronic kidney disease with heart failure and stage 1 through stage 4 chronic kidney disease, or unspecified chronic kidney disease: Secondary | ICD-10-CM | POA: Diagnosis present

## 2017-12-17 DIAGNOSIS — I739 Peripheral vascular disease, unspecified: Secondary | ICD-10-CM | POA: Diagnosis present

## 2017-12-17 DIAGNOSIS — N189 Chronic kidney disease, unspecified: Secondary | ICD-10-CM | POA: Diagnosis present

## 2017-12-17 DIAGNOSIS — I959 Hypotension, unspecified: Secondary | ICD-10-CM | POA: Diagnosis present

## 2017-12-17 DIAGNOSIS — E872 Acidosis: Secondary | ICD-10-CM | POA: Diagnosis present

## 2017-12-17 DIAGNOSIS — E039 Hypothyroidism, unspecified: Secondary | ICD-10-CM | POA: Diagnosis present

## 2017-12-17 DIAGNOSIS — E860 Dehydration: Secondary | ICD-10-CM | POA: Diagnosis present

## 2017-12-17 DIAGNOSIS — R531 Weakness: Secondary | ICD-10-CM | POA: Diagnosis present

## 2017-12-17 DIAGNOSIS — R680 Hypothermia, not associated with low environmental temperature: Secondary | ICD-10-CM | POA: Diagnosis present

## 2017-12-17 DIAGNOSIS — J320 Chronic maxillary sinusitis: Secondary | ICD-10-CM | POA: Diagnosis present

## 2017-12-17 DIAGNOSIS — J302 Other seasonal allergic rhinitis: Secondary | ICD-10-CM | POA: Diagnosis present

## 2017-12-17 DIAGNOSIS — G4730 Sleep apnea, unspecified: Secondary | ICD-10-CM | POA: Diagnosis present

## 2017-12-17 DIAGNOSIS — M069 Rheumatoid arthritis, unspecified: Secondary | ICD-10-CM | POA: Diagnosis present

## 2017-12-17 DIAGNOSIS — E871 Hypo-osmolality and hyponatremia: Secondary | ICD-10-CM | POA: Diagnosis present

## 2017-12-17 DIAGNOSIS — I2510 Atherosclerotic heart disease of native coronary artery without angina pectoris: Secondary | ICD-10-CM | POA: Diagnosis present

## 2017-12-17 DIAGNOSIS — I252 Old myocardial infarction: Secondary | ICD-10-CM | POA: Diagnosis not present

## 2017-12-17 DIAGNOSIS — Z95810 Presence of automatic (implantable) cardiac defibrillator: Secondary | ICD-10-CM | POA: Diagnosis not present

## 2017-12-17 LAB — CBC
HCT: 34.7 % — ABNORMAL LOW (ref 39.0–52.0)
Hemoglobin: 11 g/dL — ABNORMAL LOW (ref 13.0–17.0)
MCH: 30.2 pg (ref 26.0–34.0)
MCHC: 31.7 g/dL (ref 30.0–36.0)
MCV: 95.3 fL (ref 80.0–100.0)
Platelets: 212 10*3/uL (ref 150–400)
RBC: 3.64 MIL/uL — AB (ref 4.22–5.81)
RDW: 14.8 % (ref 11.5–15.5)
WBC: 7.1 10*3/uL (ref 4.0–10.5)
nRBC: 0 % (ref 0.0–0.2)

## 2017-12-17 LAB — BASIC METABOLIC PANEL
Anion gap: 12 (ref 5–15)
BUN: 33 mg/dL — ABNORMAL HIGH (ref 8–23)
CO2: 22 mmol/L (ref 22–32)
CREATININE: 1.42 mg/dL — AB (ref 0.61–1.24)
Calcium: 8.7 mg/dL — ABNORMAL LOW (ref 8.9–10.3)
Chloride: 95 mmol/L — ABNORMAL LOW (ref 98–111)
GFR calc Af Amer: 55 mL/min — ABNORMAL LOW (ref >60)
GFR, EST NON AFRICAN AMERICAN: 47 mL/min — AB (ref >60)
Glucose, Bld: 91 mg/dL (ref 70–99)
Potassium: 3.7 mmol/L (ref 3.5–5.1)
Sodium: 129 mmol/L — ABNORMAL LOW (ref 135–145)

## 2017-12-17 LAB — LACTIC ACID, PLASMA
Lactic Acid, Venous: 2 mmol/L (ref 0.5–1.9)
Lactic Acid, Venous: 2.3 mmol/L (ref 0.5–1.9)
Lactic Acid, Venous: 2.8 mmol/L (ref 0.5–1.9)

## 2017-12-17 NOTE — Progress Notes (Signed)
[facility] NAME: [name]    MR#:  [id number]  DATE OF BIRTH:  Aug 23, 1940  SUBJECTIVE:  CHIEF COMPLAINT: Patient is feeling okay reporting right-sided sinus pain but refusing Nasonex or Rhinocort, states he does not work Wife at bedside  REVIEW OF SYSTEMS:  CONSTITUTIONAL: No fever, reports generalized weakness.  EYES: No blurred or double vision.  EARS, NOSE, AND THROAT: No tinnitus or ear pain.  RESPIRATORY: No cough, shortness of breath, wheezing or hemoptysis.  CARDIOVASCULAR: No chest pain, orthopnea, edema.  GASTROINTESTINAL: No nausea, vomiting, diarrhea or abdominal pain.  GENITOURINARY: No dysuria, hematuria.  ENDOCRINE: No polyuria, nocturia,  HEMATOLOGY: No anemia, easy bruising or bleeding SKIN: No rash or lesion. MUSCULOSKELETAL: No joint pain or arthritis.   NEUROLOGIC: No tingling, numbness, weakness.  PSYCHIATRY: No anxiety or depression.   DRUG ALLERGIES:   Allergies  Allergen Reactions  . Bactrim [Sulfamethoxazole-Trimethoprim] Other (See Comments)    Severe dehydration  . Doxycycline Other (See Comments)    sunburn  . Pravastatin Other (See Comments)    Reaction:  Joint pain     VITALS:  Blood pressure 108/64, pulse 83, temperature (!) 97.5 F (36.4 C), temperature source Oral, resp. rate 20, height 5\' 9"  (1.753 m), weight 75.2 kg, SpO2 94 %.  PHYSICAL EXAMINATION:  GENERAL:  77 y.o.-year-old patient lying in the bed with no acute distress.  EYES: Pupils equal, round, reactive to light and accommodation. No scleral icterus. Extraocular muscles intact.  HEENT: Head atraumatic, normocephalic. Oropharynx and nasopharynx clear.  NECK:  Supple, no jugular venous distention. No thyroid enlargement, no tenderness.  LUNGS: Normal breath sounds bilaterally, no wheezing, rales,rhonchi or crepitation. No use of accessory muscles of respiration.  CARDIOVASCULAR: S1, S2 normal. No murmurs, rubs,  or gallops.  ABDOMEN: Soft, nontender, nondistended. Bowel sounds present. EXTREMITIES: No pedal edema, cyanosis, or clubbing.  NEUROLOGIC: Awake alert and oriented x3 sensation intact. Gait not checked.  PSYCHIATRIC: The patient is alert and oriented x 3.  SKIN: No obvious rash, lesion, or ulcer.    LABORATORY PANEL:   CBC Recent Labs  Lab 12/17/17 0402  WBC 7.1  HGB 11.0*  HCT 34.7*  PLT 212   ------------------------------------------------------------------------------------------------------------------  Chemistries  Recent Labs  Lab 12/16/17 1023 12/17/17 0402  NA 128* 129*  K 4.6 3.7  CL 94* 95*  CO2 22 22  GLUCOSE 87 91  BUN 36* 33*  CREATININE 1.58* 1.42*  CALCIUM 9.1 8.7*  MG 2.4  --   AST 161*  --   ALT 81*  --   ALKPHOS 113  --   BILITOT 1.2  --    ------------------------------------------------------------------------------------------------------------------  Cardiac Enzymes Recent Labs  Lab 12/16/17 1023  TROPONINI 0.09*   ------------------------------------------------------------------------------------------------------------------  RADIOLOGY:  Dg Chest 1 View  Result Date: 12/16/2017 CLINICAL DATA:  Patient presented to the wound center today with hypotension hypothermia. EXAM: CHEST  1 VIEW COMPARISON:  PA and lateral chest 12/14/2017. FINDINGS: Again seen is marked cardiomegaly. Large right pleural effusion has markedly increased since the prior examination. Small left pleural effusion is unchanged. There is interstitial pulmonary edema. Basilar atelectasis is noted. The patient is status post CABG with an AICD in place. IMPRESSION: Marked increase in a right pleural effusion. Small left effusion is unchanged. Cardiomegaly and interstitial edema. Bibasilar airspace disease is likely due to compressive atelectasis from the patient's effusions. Electronically Signed   By: Inge Rise M.D.   On: 12/16/2017 11:19  Ct Head Wo  Contrast  Result Date: 12/16/2017 CLINICAL DATA:  Altered level of consciousness, unexplained EXAM: CT HEAD WITHOUT CONTRAST TECHNIQUE: Contiguous axial images were obtained from the base of the skull through the vertex without intravenous contrast. COMPARISON:  12/01/2016 FINDINGS: Brain: No evidence of acute infarction, hemorrhage, hydrocephalus, extra-axial collection or mass lesion/mass effect. Cerebral volume loss that is mild for age. Mild to moderate chronic small vessel ischemia in the cerebral white matter Vascular: Extensive atherosclerotic calcification. No hyperdense vessel. Skull: No acute or aggressive finding Sinuses/Orbits: No acute finding. Chronic right maxillary sinusitis with calcified debris and sclerotic wall thickening. Bilateral cataract resection IMPRESSION: Senescent changes without acute intracranial finding. Electronically Signed   By: Monte Fantasia M.D.   On: 12/16/2017 11:28    EKG:   Orders placed or performed during the hospital encounter of 12/16/17  . EKG 12-Lead  . EKG 12-Lead    ASSESSMENT AND PLAN:     Generalized weakness due to dehydration and low blood pressure.  Hold Lasix Discontinued Lopressor and Entresto which were already stopped 1 week before [facility] PT assessment  Chronic systolic CHF with ejection fraction 25% Hold Lasix Discontinue Lopressor and Entresto, continue amiodarone  cardiology consult to Dr. Ubaldo Glassing patient's primary cardiologist  Lactic acidosis, mild, follow-up lactic acid level. Blood cultures are negative.  Patient is afebrile.  Not considering antibiotics at this time  Chronic hyponatremia.  Hold Lasix for now.  Follow-up BMP. Sodium at 129.  Patient is asymptomatic  Chronic elevated troponin. Patient is asymptomatic   All the records are reviewed and case discussed with Care Management/Social Workerr. Management plans discussed with the patient, family and they are in agreement.  CODE STATUS: fc    TOTAL TIME TAKING CARE OF THIS PATIENT: 35 minutes.   POSSIBLE D/C IN 1  DAYS, DEPENDING ON CLINICAL CONDITION.  Note: This dictation was prepared with Dragon dictation along with smaller phrase technology. Any transcriptional errors that result from this process are unintentional.   Nicholes Mango M.D on 12/17/2017 at 1:01 PM  Between 7am to 6pm - Pager - [phone] After 6pm go to [email or web address] - password [email or web address] [facility]  Office  [phone]  CC: Primary care physician; Cyndi Bender, PA-C

## 2017-12-17 NOTE — Consult Note (Signed)
Cardiology Consultation Note    Patient ID: [name], MRN: [id number], DOB/AGE: [birth year] 78 y.o. Admit date: 12/16/2017   Date of Consult: 12/17/2017 Primary Physician: Cyndi Bender, PA-C Primary Cardiologist: Dr. Ubaldo Glassing  Chief Complaint: weak and dizzy Reason for Consultation: hypotension and dehydration Requesting MD: Dr. Margaretmary Eddy  HPI: [name] is a 77 y.o. male admitted from the er with complaints of weakness and dizzynes and anorexia. Patient has a history of ischemic cardiomyopathy status post coronary artery bypass grafting. He has an ejection fraction of approximately 20% by echocardiogram. He has an AICD in place for secondary prevention. He has also underwent VT ablation on December 29, 2016. He is treated with amiodarone at 200 mg daily and continued on Mexitil at 150 mg 3 times daily.[name] He is anticoagulatied with apixaban. Part of his workup he underwent cardiac catheterization which revealed a patent LIMA to the LAD he is doing somewhat better on current regimen. Recently had his metoprolol discontinued and is doing very well. He is much more to ambulate. He is less short of breath. He denies syncope. His recent AICD interrogation revealed is functioning normally with no V. Tach. Labs on presentation to the ere revealed acute on chronic renal insuffiency with a serum creatinine of 1.58 up from baseline of 1.11. Lactate was 2.09. Hgb was 11.0. CXR revealed bibasilar airspace disease c/w atelectasis. Also has cardiomegaly and insterstitial edema with small bilateral effusions. Head ct revealed no acute findings. He was admitted and gently hydrated. He was taken off of his entresto due to hypotension. He has had no dysrhythmia. His chornic eg wound was stable. He has imporved overnight and dneis chest pain or sob. Has been less fatigued. He denies chest pain.   Past Medical History:  Diagnosis Date  . AICD (automatic cardioverter/defibrillator) present    [employer]IENTIFIC  . Arthritis    RA  . Balance problem   . CHF (congestive heart failure) ([place])   . Collagen vascular disease ([place])    RA since 2014  . Complication of anesthesia    slow to wake up  . Coronary artery disease   . Deviated nasal septum   . Dysrhythmia    v tach  . Hearing loss    TINNITUS  . High cholesterol   . Hypothyroidism   . Leg weakness   . Myocardial infarction ([place])    x 2  . Peripheral vascular disease ([place])   . Presence of permanent cardiac pacemaker    WITH DEFIBRILATOR ([employer])  . Seasonal allergies    RHINITIS  . Shortness of breath dyspnea    COUGH  . Sinus headache   . Sinusitis    CHRONIC  . Sleep apnea   . Vertigo    DYSEQUILIBRIUM      Surgical History:  Past Surgical History:  Procedure Laterality Date  . CARDIAC ELECTROPHYSIOLOGY STUDY AND ABLATION    . CARDIAC SURGERY    . CATARACT EXTRACTION     BILATERAL  . CORONARY ANGIOPLASTY     STENTS  . CORONARY ARTERY BYPASS GRAFT  1992  . heart pump    . INSERT / REPLACE / REMOVE PACEMAKER    . LOWER EXTREMITY ANGIOGRAPHY Right 07/06/2017   Procedure: LOWER EXTREMITY ANGIOGRAPHY;  Surgeon: Algernon Huxley, MD;  Location: [facility];  Service: Cardiovascular;  Laterality: Right;  . LOWER EXTREMITY ANGIOGRAPHY Left 11/02/2017   Procedure: LOWER EXTREMITY ANGIOGRAPHY;  Surgeon: Algernon Huxley, MD;  Location: [facility];  Service: Cardiovascular;  Laterality: Left;  . SEPTOPLASTY       Home Meds: Prior to Admission medications   Medication Sig Start Date End Date Taking? Authorizing Provider  acetaminophen (TYLENOL 8 HOUR ARTHRITIS PAIN) 650 MG CR tablet Take 650-1,300 mg by mouth every 8 (eight) hours as needed (pain/headaches.).   Yes [provider]  amiodarone (PACERONE) 200 MG tablet Take 100 mg by mouth at bedtime.    Yes [provider]  aspirin EC 81 MG tablet Take 81 mg by mouth daily.   Yes [provider]  cetirizine (ZYRTEC)  10 MG tablet Take 10 mg by mouth daily.   Yes [provider]  Cholecalciferol (VITAMIN D3) 2000 UNITS TABS Take 2,000 Units by mouth daily.    Yes [provider]  clopidogrel (PLAVIX) 75 MG tablet Take 1 tablet (75 mg total) by mouth daily. 07/06/17  Yes Dew, Erskine Squibb, MD  docusate sodium (COLACE) 100 MG capsule Take 100-200 mg by mouth daily as needed (for constipation.).    Yes [provider]  furosemide (LASIX) 40 MG tablet Take 20 mg by mouth daily as needed for edema (for fluid retention.).    Yes [provider]  leflunomide (ARAVA) 10 MG tablet Take 10 mg by mouth daily. 05/28/17  Yes [provider]  levothyroxine (SYNTHROID, LEVOTHROID) 50 MCG tablet Take 50 mcg by mouth daily before breakfast.  02/13/17  Yes [provider]  mexiletine (MEXITIL) 150 MG capsule Take 150 mg by mouth every 8 (eight) hours.    Yes [provider]  nitroGLYCERIN (NITROSTAT) 0.4 MG SL tablet Place 0.4 mg under the tongue every 5 (five) minutes as needed for chest pain.   Yes [provider]  Polyethylene Glycol 3350 (PEG 3350) POWD Take 17 g by mouth daily as needed (for constipation.).    Yes [provider]  potassium chloride (K-DUR) 10 MEQ tablet Take 10 mEq by mouth daily.  03/24/17 03/24/18 Yes [provider]  SANTYL ointment Apply 1 application topically 3 (three) times a week. Apply a "nickel thick" amount to wound as directed 06/04/17  Yes [provider]  vitamin B-12 (CYANOCOBALAMIN) 1000 MCG tablet Take 1,000 mcg by mouth daily.   Yes [provider]  atorvastatin (LIPITOR) 10 MG tablet Take 1 tablet (10 mg total) by mouth daily. Patient not taking: Reported on 12/16/2017 11/02/17 11/02/18  Algernon Huxley, MD  doxycycline (VIBRAMYCIN) 100 MG capsule TAKE 1 CAPSULE BY MOUTH TWICE DAILY FOR PNEUMONIA 11/27/17   [provider]  ENTRESTO 24-26 MG Take 1 tablet by mouth every 12 (twelve) hours.  05/28/17   [provider]  metoprolol succinate (TOPROL-XL) 25 MG 24 hr tablet Take 25 mg by mouth 2 (two) times daily.  12/03/13   [provider]  traMADol (ULTRAM) 50 MG tablet Take 1 tablet by mouth 3 (three) times daily as needed (for pan.).  02/18/17   [provider]    Inpatient Medications:  . amiodarone  100 mg Oral QHS  . aspirin EC  81 mg Oral Daily  . cholecalciferol  2,000 Units Oral Daily  . clopidogrel  75 mg Oral Daily  . heparin  5,000 Units Subcutaneous Q8H  . leflunomide  10 mg Oral Daily  . levothyroxine  50 mcg Oral QAC breakfast  . loratadine  10 mg Oral Daily  . mexiletine  150 mg Oral Q8H  . sodium chloride flush  3 mL Intravenous Q12H  . vitamin B-12  1,000 mcg Oral Daily   . sodium chloride      Allergies:  Allergies  Allergen Reactions  . Bactrim [Sulfamethoxazole-Trimethoprim] Other (See Comments)    Severe dehydration  . Doxycycline Other (See Comments)    sunburn  . Pravastatin Other (See Comments)    Reaction:  Joint pain     Social History   Socioeconomic History  . Marital status: Married    Spouse name: [name]  . Number of children: 3  . Years of education: 19  . Highest education level: Not on file  Occupational History    Comment: Retired  Scientific laboratory technician  . Financial resource strain: Not very hard  . Food insecurity:    Worry: Never true    Inability: Never true  . Transportation needs:    Medical: No    Non-medical: No  Tobacco Use  . Smoking status: Never Smoker  . Smokeless tobacco: Never Used  Substance and Sexual Activity  . Alcohol use: No    Alcohol/week: 0.0 standard drinks  . Drug use: No  . Sexual activity: Not on file  Lifestyle  . Physical activity:    Days per week: 0 days    Minutes per session: 0 min  . Stress: To some extent  Relationships  . Social connections:    Talks on phone: More than three times a week    Gets together: More than three times a week    Attends religious  service: 1 to 4 times per year    Active member of club or organization: Not on file    Attends meetings of clubs or organizations: Not on file    Relationship status: Married  . Intimate partner violence:    Fear of current or ex partner: No    Emotionally abused: No    Physically abused: No    Forced sexual activity: No  Other Topics Concern  . Not on file  Social History Narrative   Patient lives at home with his wife [name]).   Retired.   Education 12th grade.   Right handed.   Caffeine one cup daily.     Family History  Problem Relation Age of Onset  . Alzheimer's disease Mother      Review of Systems: A 12-system review of systems was performed and is negative except as noted in the HPI.  Labs: Recent Labs    12/16/17 1023  TROPONINI 0.09*   Lab Results  Component Value Date   WBC 7.1 12/17/2017   HGB 11.0 (L) 12/17/2017   HCT 34.7 (L) 12/17/2017   MCV 95.3 12/17/2017   PLT 212 12/17/2017    Recent Labs  Lab 12/16/17 1023 12/17/17 0402  NA 128* 129*  K 4.6 3.7  CL 94* 95*  CO2 22 22  BUN 36* 33*  CREATININE 1.58* 1.42*  CALCIUM 9.1 8.7*  PROT 7.3  --   BILITOT 1.2  --   ALKPHOS 113  --   ALT 81*  --   AST 161*  --   GLUCOSE 87 91   No results found for: CHOL, HDL, LDLCALC, TRIG No results found for: DDIMER  Radiology/Studies:  Dg Chest 1 View  Result Date: 12/16/2017 CLINICAL DATA:  Patient presented to the wound center today with hypotension hypothermia. EXAM: CHEST  1 VIEW COMPARISON:  PA and lateral chest 12/14/2017. FINDINGS: Again seen is marked cardiomegaly. Large right pleural effusion has markedly increased since the  prior examination. Small left pleural effusion is unchanged. There is interstitial pulmonary edema. Basilar atelectasis is noted. The patient is status post CABG with an AICD in place. IMPRESSION: Marked increase in a right pleural effusion. Small left effusion is unchanged. Cardiomegaly and interstitial edema. Bibasilar  airspace disease is likely due to compressive atelectasis from the patient's effusions. Electronically Signed   By: Inge Rise M.D.   On: 12/16/2017 11:19   Ct Head Wo Contrast  Result Date: 12/16/2017 CLINICAL DATA:  Altered level of consciousness, unexplained EXAM: CT HEAD WITHOUT CONTRAST TECHNIQUE: Contiguous axial images were obtained from the base of the skull through the vertex without intravenous contrast. COMPARISON:  12/01/2016 FINDINGS: Brain: No evidence of acute infarction, hemorrhage, hydrocephalus, extra-axial collection or mass lesion/mass effect. Cerebral volume loss that is mild for age. Mild to moderate chronic small vessel ischemia in the cerebral white matter Vascular: Extensive atherosclerotic calcification. No hyperdense vessel. Skull: No acute or aggressive finding Sinuses/Orbits: No acute finding. Chronic right maxillary sinusitis with calcified debris and sclerotic wall thickening. Bilateral cataract resection IMPRESSION: Senescent changes without acute intracranial finding. Electronically Signed   By: Monte Fantasia M.D.   On: 12/16/2017 11:28   Vas [place] Burnard Bunting With/wo Tbi  Result Date: 12/08/2017 LOWER EXTREMITY DOPPLER STUDY Indications: Peripheral artery disease, and Right slow healing ulcer. 07/06/2017              PTA of right TP trunk and calf vessels.  Vascular Interventions: 11/02/2017 Lt TP Trunk and PTA Angiography. Performing Technologist: Almira Coaster RVS  Examination Guidelines: A complete evaluation includes at minimum, Doppler waveform signals and systolic blood pressure reading at the level of bilateral brachial, anterior tibial, and posterior tibial arteries, when vessel segments are accessible. Bilateral testing is considered an integral part of a complete examination. Photoelectric Plethysmograph (PPG) waveforms and toe systolic pressure readings are included as required and additional duplex testing as needed. Limited examinations for reoccurring indications  may be performed as noted.  ABI Findings: +---------+------------------+-----+---------+--------+ Right    Rt Pressure (mmHg)IndexWaveform Comment  +---------+------------------+-----+---------+--------+ Brachial 100                                      +---------+------------------+-----+---------+--------+ ATA      204               2.04 triphasicNC       +---------+------------------+-----+---------+--------+ PTA      219               2.19 triphasicNC       +---------+------------------+-----+---------+--------+ Great Toe72                0.72 Normal            +---------+------------------+-----+---------+--------+ +---------+------------------+-----+---------+-------+ Left     Lt Pressure (mmHg)IndexWaveform Comment +---------+------------------+-----+---------+-------+ Brachial 100                                     +---------+------------------+-----+---------+-------+ ATA      194               1.94 triphasicNC      +---------+------------------+-----+---------+-------+ PTA      139               1.39 triphasicNC      +---------+------------------+-----+---------+-------+ [place] Fail  0.01 Abnormal         +---------+------------------+-----+---------+-------+ +-------+-----------+-----------+------------+------------+ ABI/TBIToday's ABIToday's TBIPrevious ABIPrevious TBI +-------+-----------+-----------+------------+------------+ Right  [place]         .72        [place]          .84          +-------+-----------+-----------+------------+------------+ Left   [place]         .51                  .82          +-------+-----------+-----------+------------+------------+ Bilateral ABIs appear essentially unchanged compared to prior study on 10/20/2017. Bilateral TBIs appear decreased compared to prior study on 10/20/2017.  Summary: Right: Resting right ankle-brachial index indicates noncompressible right lower extremity arteries.The  right toe-brachial index is normal. Left: Resting left ankle-brachial index indicates noncompressible left lower extremity arteries.The left toe-brachial index is abnormal.  *See table(s) above for measurements and observations.  Electronically signed by [name] MD on 12/08/2017 at 9:42:37 AM.    Final     Wt Readings from Last 3 Encounters:  12/16/17 75.2 kg  12/01/17 74.7 kg  11/02/17 73 kg    EKG: nsr with rbbb  Physical Exam:  Blood pressure 96/67, pulse 84, temperature (!) 97.5 F (36.4 C), temperature source Oral, resp. rate 20, height 5\' 9"  (1.753 m), weight 75.2 kg, SpO2 97 %. Body mass index is 24.47 kg/m. General: Well developed, well nourished, in no acute distress. Head: Normocephalic, atraumatic, sclera non-icteric, no xanthomas, nares are without discharge.  Neck: Negative for carotid bruits. JVD not elevated. Lungs: Clear bilaterally to auscultation with decreased breath sounds at bases Breathing is unlabored. Heart: RRR with S1 S2. 2/6 systollic murmur Abdomen: Soft, non-tender, non-distended with normoactive bowel sounds. No hepatomegaly. No rebound/guarding. No obvious abdominal masses. Msk:  Strength and tone appear normal for age. Extremities: Bilaterl lower extremety edema. Wraps in place.  Neuro: Alert and oriented X 3. No facial asymmetry. No focal deficit. Moves all extremities spontaneously. Psych:  Responds to questions appropriately with a normal affect.     Assessment and Plan  Patient has a history of ischemic cardiomyopathy status post coronary artery bypass grafting. He has an ejection fraction of approximately 20% by echocardiogram. He has an AICD in place for secondary prevention. He has also underwent VT ablation on December 29, 2016. He is treated with amiodarone at 400 mg daily and continued on Mexitil at 150 mg 3 times daily.[name] He is continue with anticoagulation with apixaban. He was admitted with progressive anorexia, sob and dizzyness and was noted  ot be dehydrated and hypotensive. He has acute on chronic renal insuffiency.  CHF-does not appear to be volume depleted. CXR shows chorni cardiomegaly and no frank pulmonary edema. Will hold entresto due to hypotension.   Dehydration-acute on chronic renal insuffiency. Creatinine slightly improved today. Careful hydratioin. Hold entresto and conitnue with amiodaonre and mexitil. Will consider adding back entresto as outpatient when hemodynamics improve.  Lower extremey wounds-conitnue with wound center treatemnt.  VT-as per above. No dysthrythmia at present. Conitnue with amiodaorne and mexitil. Hold carvedilol for now. AICD in place.   Signed, [name] MD 12/17/2017, 8:25 PM Pager: [phone]

## 2017-12-17 NOTE — Evaluation (Signed)
Physical Therapy Evaluation Patient Details Name: [name] MRN: [id number] DOB: [birth year] Today's Date: 12/17/2017   History of Present Illness   77 y.o. male with a history of multiple medical problem including CHF, CAD, s/p S AICD, PVD, sleep apnea.  The patient came in with generalized weakness and poor oral intake for several days, he was just discharged from hospital recently for dehydration.    Clinical Impression  Pt did well with PT exam and though he states he could hardly even lift his legs yesterday he feels considerably better today and showed functional strength in b/l LEs and good overall mobility.  He was able to tolerate 200 ft of ambulation and overall is not too far from his baseline.  He does not need a SPC typically and does have some gains to make to get back to baseline, would benefit from HHPT to address this but overall he should be safe to return home.      Follow Up Recommendations Home health PT    Equipment Recommendations  None recommended by PT    Recommendations for Other Services       Precautions / Restrictions Precautions Precautions: Fall Restrictions Weight Bearing Restrictions: No      Mobility  Bed Mobility Overal bed mobility: Independent             General bed mobility comments: Pt easily gets to EOB w/o assist  Transfers Overall transfer level: Independent Equipment used: Rolling walker (2 wheeled)             General transfer comment: Pt able to rise w/o assist, good confidence and safety with getting to/maintaining standing  Ambulation/Gait Ambulation/Gait assistance: Supervision Gait Distance (Feet): 200 Feet Assistive device: Rolling walker (2 wheeled)       General Gait Details: Pt showed consistent and confident cadence w/o excessive use of walker.  His O2 remained in the high 90 on room air and he had modest increase in HR to ~100 bpm with the effort.   Stairs            Wheelchair Mobility     Modified Rankin (Stroke Patients Only)       Balance Overall balance assessment: Modified Independent                                           Pertinent Vitals/Pain Pain Assessment: No/denies pain    Home Living Family/patient expects to be discharged to:: Private residence Living Arrangements: Spouse/significant other Available Help at Discharge: Family Type of Home: House Home Access: Stairs to enter Entrance Stairs-Rails: Right Entrance Stairs-Number of Steps: 3 at [place] with 1 rail.  Home Layout: One level Home Equipment: Walker - 2 wheels;Cane - single point;Bedside commode;Shower seat      Prior Function Level of Independence: Needs assistance   Gait / Transfers Assistance Needed: SPC for household distances, uses RW for longer distances  ADL's / Homemaking Assistance Needed: can get dressed, use restroom, etc w/o assist        Hand Dominance        Extremity/Trunk Assessment   Upper Extremity Assessment Upper Extremity Assessment: Overall WFL for tasks assessed(age appropriate limitations)    Lower Extremity Assessment Lower Extremity Assessment: Overall WFL for tasks assessed(age appropriate, reports he couldn't lift LEs yesterday)       Communication   Communication: No difficulties  Cognition Arousal/Alertness:  Awake/alert Behavior During Therapy: WFL for tasks assessed/performed Overall Cognitive Status: Within Functional Limits for tasks assessed                                        General Comments      Exercises     Assessment/Plan    PT Assessment Patient needs continued PT services  PT Problem List Decreased strength;Decreased activity tolerance;Decreased range of motion;Decreased balance;Decreased mobility;Decreased coordination;Decreased knowledge of use of DME;Decreased safety awareness;Cardiopulmonary status limiting activity       PT Treatment Interventions Gait training;DME  instruction;Stair training;Functional mobility training;Therapeutic activities;Therapeutic exercise;Balance training;Neuromuscular re-education;Patient/family education    PT Goals (Current goals can be found in the Care Plan section)  Acute Rehab PT Goals Patient Stated Goal: go home tomorrow PT Goal Formulation: With patient Time For Goal Achievement: 12/31/17 Potential to Achieve Goals: Good    Frequency Min 2X/week   Barriers to discharge        Co-evaluation               AM-PAC PT "6 Clicks" Mobility  Outcome Measure Help needed turning from your back to your side while in a flat bed without using bedrails?: None Help needed moving from lying on your back to sitting on the side of a flat bed without using bedrails?: None Help needed moving to and from a bed to a chair (including a wheelchair)?: None Help needed standing up from a chair using your arms (e.g., wheelchair or bedside chair)?: None Help needed to walk in hospital room?: None Help needed climbing 3-5 steps with a railing? : A Little 6 Click Score: 23    End of Session Equipment Utilized During Treatment: Gait belt Activity Tolerance: Patient tolerated treatment well Patient left: with chair alarm set;with call bell/phone within reach Nurse Communication: Mobility status PT Visit Diagnosis: Muscle weakness (generalized) (M62.81);Difficulty in walking, not elsewhere classified (R26.2)    Time: 1446-1510 PT Time Calculation (min) (ACUTE ONLY): 24 min   Charges:   PT Evaluation $PT Eval Low Complexity: 1 Low          Kreg Shropshire, DPT 12/17/2017, 3:28 PM

## 2017-12-17 NOTE — Plan of Care (Signed)

## 2017-12-18 LAB — LACTIC ACID, PLASMA: Lactic Acid, Venous: 1.7 mmol/L (ref 0.5–1.9)

## 2017-12-18 NOTE — Care Management (Signed)
Query for zip code [phone] completed. Discussed Home Health agencies. Still would like to have [facility]. Will update Sharmon Revere. [facility] representative. Copy of query placed on chart. Copy of query given to wife. Discharge to home today per Dr. Margaretmary Eddy. Shelbie Ammons RN MSN CCM Care Management [phone]

## 2017-12-18 NOTE — Progress Notes (Signed)
Jermaine, [name] ([id number]) Visit Report for 12/16/2017 Debridement Details Patient Name: Jermaine, [name] Date of Service: 12/16/2017 8:30 AM Medical Record Number: [id number] Patient Account Number: [id number] Date of Birth/Sex: 03-15-1940 (76 y.o. M) Treating RN: Cornell Barman Primary Care Provider: Cyndi Bender Other Clinician: Referring Provider: Cyndi Bender Treating Provider/Extender: Tito Dine in Treatment: 36 Debridement Performed for Wound #6 Right,Lateral Malleolus Assessment: Performed By: Physician Ricard Dillon, MD Debridement Type: Chemical/Enzymatic/Mechanical Agent Used: Santyl Severity of Tissue Pre Fat layer exposed Debridement: Level of Consciousness (Pre- Awake and Alert procedure): Pre-procedure Verification/Time Yes - 09:10 Out Taken: Start Time: 09:10 Pain Control: Lidocaine Instrument: Other : tongue blade Bleeding: None Response to Treatment: Procedure was tolerated well Level of Consciousness Awake and Alert (Post-procedure): Post Debridement Measurements of Total Wound Length: (cm) 0.3 Width: (cm) 0.2 Depth: (cm) 0.2 Volume: (cm) 0.009 Character of Wound/Ulcer Post Debridement: Requires Further Debridement Severity of Tissue Post Debridement: Fat layer exposed Post Procedure Diagnosis Same as Pre-procedure Electronic Signature(s) Signed: 12/16/2017 4:51:45 PM By: Linton Ham MD Signed: 12/16/2017 5:09:03 PM By: Gretta Cool, BSN, RN, CWS, Kim RN, BSN Entered By: Gretta Cool, BSN, RN, CWS, Kim on 12/16/2017 09:11:48 Mabey, [name] ([id number]) -------------------------------------------------------------------------------- Debridement Details Patient Name: [name] Date of Service: 12/16/2017 8:30 AM Medical Record Number: [id number] Patient Account Number: [id number] Date of Birth/Sex: July 21, 1940 (76 y.o. M) Treating RN: Cornell Barman Primary Care Provider: Cyndi Bender Other Clinician: Referring  Provider: Cyndi Bender Treating Provider/Extender: Tito Dine in Treatment: 36 Debridement Performed for Wound #8 Right,Distal,Lateral [name] Assessment: Performed By: Physician Ricard Dillon, MD Debridement Type: Chemical/Enzymatic/Mechanical Agent Used: Santyl Severity of Tissue Pre Fat layer exposed Debridement: Level of Consciousness (Pre- Awake and Alert procedure): Pre-procedure Verification/Time Yes - 09:10 Out Taken: Start Time: 09:10 Pain Control: Lidocaine Instrument: Other : tongue blade Bleeding: None Response to Treatment: Procedure was tolerated well Level of Consciousness Awake and Alert (Post-procedure): Post Debridement Measurements of Total Wound Length: (cm) 0.3 Width: (cm) 0.2 Depth: (cm) 0.2 Volume: (cm) 0.009 Character of Wound/Ulcer Post Debridement: Requires Further Debridement Severity of Tissue Post Debridement: Fat layer exposed Post Procedure Diagnosis Same as Pre-procedure Electronic Signature(s) Signed: 12/16/2017 4:51:45 PM By: Linton Ham MD Signed: 12/16/2017 5:09:03 PM By: Gretta Cool, BSN, RN, CWS, Kim RN, BSN Entered By: Gretta Cool, BSN, RN, CWS, Kim on 12/16/2017 09:13:29 Aytes, [name] ([id number]) -------------------------------------------------------------------------------- HPI Details Patient Name: [name] Date of Service: 12/16/2017 8:30 AM Medical Record Number: [id number] Patient Account Number: [id number] Date of Birth/Sex: Sep 08, 1940 (76 y.o. M) Treating RN: Cornell Barman Primary Care Provider: Cyndi Bender Other Clinician: Referring Provider: Cyndi Bender Treating Provider/Extender: Tito Dine in Treatment: 19 History of Present Illness HPI Description: 04/08/17; this is a complex 77 year old man referred here from [facility]. He had been referred there for bilateral lower extremity edema with ulcer formation predominantly on the right calf but also the right  [name]. He had been receiving Unna boots bilaterally. The history here is long. He is not a diabetic however ICU looking through late 2018 he was worked up for chronic headaches, elevated inflammatory markers including C-reactive protein and ESR. He went on to actually have a left temporal artery biopsy wasn't that was negative.he received about 6 weeks of high-dose prednisone 60 mg with improvement in his inflammatory markers. He was admitted to hospital in late November with ventricular tachycardia syncope. He has known ischemic cardiomyopathy. He was admitted in the hospital in mid December. Apparently  this was precipitated by a syncopal spell falling out of his scooter while at [place]. There was ventricular arrhythmia. He has an implantable defibrillator and echocardiogram showed severe LV dysfunction with an EF of 20% and valvular regurgitations including mild AR, moderate MR. There was no stenosis. He ruled in for a non-ST elevation MI in the setting of V. tach.his wife states that sometime during this hospitalization she noted multiple areas of skin change on the right lower calf which became evident just after he left the hospital. He was back in hospital in February with acute renal failure hyponatremia. This responded to fluid resuscitation.. Interestingly I can't see much description of his right leg at that point in time.he was followed by Dr. Nehemiah Massed of dermatology for the necrotic wounds on his right leg. Apparently a biopsy was planned at one point but not done although in some notes that suggests it was. I cannot see these results area He was noted to have a lot of edema. Was treated with bilateral Unna boots edges really helped with the swelling they have been using Bactroban to small open areas predominantly on the right anterior lower leg His history is complicated by the fact that he has rheumatoid arthritis followed by rheumatology. He is followed by neurology for disabling  headaches. At one point this was felt to be giant cell arteritis although a left temporal biopsy was apparently negative. He was given a prolonged course of prednisone at 60 mg which managed his sedimentation rates but apparently did not prove improve the headaches. This is been tapered to off on by rheumatology on 03/13/17 Vascular had plans to do a venous reflux workup as well as arterial studies in May. They also wanted to get him a lymphedema pump. As mentioned he's been using bacitracin under Unna boot wraps to both lower legs 04/15/17; the patient arrives with most of his wounds improved. These are small punched out wounds. Most of them remaining ones are on the right anterior calf with the most problematic over the right lateral malleolus. There are no new areas. The symptom complex or potential symptom complex we are dealing with his chronic disabling headaches with inflammatory markers not responsive to prednisone and with a negative temporal biopsy, lower extremity weakness, skin ulcerations just on the right leg. We have managed to get his arterial studies moved up to April 30. He has a rheumatology consult at [facility] in June. He has seen dermatology locally, rheumatology locally, neurology locally. 04/22/17; small punched out areas on the right leg anteriorly posteriorly. Most of these appear to have closed over. Some of them have eschar over the surface. The most problematic area appears to be over the right lateral malleolus. We've been using prisma to all of this. He has arterial studies on April 30 and a rheumatology consult at [facility] on June 28 04/29/17; most of the small punched out areas on the right leg posteriorly are closed. He has 2 or 3 openings anteriorly but most of these appear to be on the way to closing. Still problematically over the right lateral malleolus and right lateral [name] with almost ischemic-looking eschar. His arterial studies that I ordered are due to be done next week  on the 30th so we should have them available for our next visit hopefully. He also saw a rheumatologist at [facility] and according to the patient he did 8 vials of blood. Finally he has scaling rash on his left [name] and what looks to be at [place] area  on the left anterior leg 05/06/17; most of the small punched out areas on the right leg posteriorly and anteriorly are closed. He still has one small one anteriorly one over the right lateral malleolus and one over the right lateral [name]. PELLEGRINO, KENNARD ([id number]) He had his arterial studies they didn't seem to do waveform analysis not exactly sure why however in any case is ABIs were noncompressible bilaterally. They did provide TBIs although looking at the pressures it appears that his TBIs are quite normal. I therefore went ahead and debrided the area over the right lateral malleolus and the right lateral [name] 05/13/17; most of the small punched out areas on the right leg posteriorly and anteriorly are closed. He continues to have problematic areas over the right lateral malleolus and the right lateral [name]. He still requiring aggressive debridement of these 2 wounds using silver collagen I reviewed the note from rheumatology I don't think they came up with a specific diagnosis although he is known to have seropositive RA. They did a panel of lab work when I was able to see his his AMA was negative, anti-smooth muscle antibodies negative antineutrophil cytoplasmic antibodies negative,Liv/kia type 1 negative. Serum C3 and C4 were negative. I don't see his muscle enzymes specifically. He was referred back to his local rheumatologist for management of his known rheumatoid arthritis.the patient states he still feels weak and fatigued. He states he has numbness in both feet and apparently is known to have neuropathy 05/20/17; the patient continues to have a difficult problem on the right lateral malleolus and the right lateral [name]. Both of these wounds  have no viable surface even with attempts at debridement. He has a new wound on the left plantar metatarsal head which looks more like a superficial diabetic pressure related injury then part of this underlying issue he has. Most of the rest of the wounds on his legs look satisfactory. Mostly on the right calf.reviewed his arterial studies which showed noncompressible vessels bilaterally but the TBIs were quite normal. 05/27/17; no real improvement in the right lateral malleolus and right lateral [name]. In fact the right lateral [name] is now on bone. I gave him doxycycline empirically last week it appears that he developed photosensitivity was 70 the son in a tractor. I'll not give him any more of this. This is predominantly on his face and dorsal forearms and hands. I given this more as an anti- inflammatory. I'm going to have him seen by vascular surgery. His TBIs that he had done previously ordered by Dr. Brigitte Pulse were in the normal range They never did full arterial studies on him. he now has small punched out wounds on the right lateral ankle and right lateral [name]. I doubt these are ischemic however I would like a review of his macrovascular status. He does describe some pain at night. I'll reduce the compression from 3 layer to 2 layer and I'm not convinced that this is a macrovascular issue however I want to make sure. We've been using silver alginate 06/03/17; the patient's x-rays that I ordered last time of his right ankle and right lateral [name] did not show definite osteomyelitis. We've been using silver alginate. The wounds are not making any progress. The area on the plantar left first metatarsal head however appears to be better. The patient complains of weakness that is more of the fatigue. He says if he's walking his head will fall onto his chest and that his legs literally gave out on him.  He did see neurology in the past however that was at a time where his workup was for temporal  arteritis and headaches. 06/10/17; the patient is making no progress with the areas on the right lateral [name] and right lateral malleolus. In fact the area on the [name] probes to bone. X-ray did not show osteomyelitis. He went and saw vein and vascular on 06/04/17 he was felt to have significant reflux in the left greater saphenous vein over this is not in the area we are most concerned about. He could be offered ablation. He was not felt to have venous reflux noted in the right lower extremity. He was felt to have some degree of lymphedema and he was felt to be a candidate for compression pumps. Finally a diagnostic arteriogram was suggested which is really what I'm most interested in. The patient has wife wanted time to think about this, I think they were confused about interacting between venous and arterial discussions. I think it would be probably well worth going through the angiogram. Culture I did have the deeper area on the right lateral [name] showed a few Enterococcus faecalis. I would like to start her on amoxicillin which they will start today for one-week 500 3 times a day 06/17/17; the patient still has punched out areas on the right lateral [name] and right lateral malleolus. There is not a viable surface here. We have been using Santyl. He also has an area on the plantar aspect of his left first metatarsal head this also seems to be better 06/24/17; patient's angiogram as on 07/06/17; still has punched out areas on the right lateral [name] and right lateral malleolus to which we've been applying Santyl-based dressings. He also has a more superficial area on the left plantar first metatarsal head, using collagen here 07/08/17; the patient had his angiogram. This perineal artery had a 70-80% stenosis. Similarly the posterior tibial artery also was diseased. Furthermore down the posterior tibial artery in the distal segment was a short stenosis of 80%. His major vessels in his thigh and only minor  irregularity. He had percutaneous angioplasty of the proximal right peroneal artery. Also angioplasty of the tibial peroneal trunk and proximal posterior tibial artery as well as the mid to distal segment of the posterior tibial artery. He handled this remarkably well. The patient arrived with a new wound on his right anterior calf which I think is the reopening of one of the original small open areas 07/15/17; the areas on his right anterior calf is new. He has a new threatened area on the right tibia more superiorly which is not open yet but makes me wonder whether this is going to reopen as well. His original wounds on the right lateral [name] and right lateral malleolus are somewhat better in terms of surface Murata, Nahiem E. ([id number]) 07/22/17; the patient has a second open area on the right anterior. This was a threatened area superiorly last week. Each time this happens she has a small wound with a nephrotic cover. The areas on the right lateral [name] and right lateral malleolus are about the same. I did not attempt debridement in any of these today continuing with Santyl. oThe area on the left second metatarsal head looks better and he is healed laceration injuries on the arm from a fall last week with only the ventral forearm wound left 08/05/17 08/05/17; 2 week follow-up. This is a patient who initially presented with a multitude of small punched out areas on the right  anterior calf greater than left dorsal [name]. This was in the setting of a constitutional illness at which time he saw multiple specialists was worked up for various rheumatologic diseases including temporal arteritis. Most of the areas on the right leg and a few on the left actually closed over however he he developed 2 small deep probing areas on the right lateral [name] and right lateral malleolus. He also has an area on the left plantar first metatarsal head which is gradually been getting better. He was revascularized by  Dr. dew about 4 weeks ago. He went to see Dr. Nehemiah Massed on 07/30/17; from review the note in time to the patient there wasn't an obvious cause. He did do a shave biopsy of the right anterior leg ulcer rule out cancer or other etiologies such as some embolic. This looks like some form of vasculopathy to me although trying to explain why it so much worse on the right leg than the left is been difficult. We will await the biopsy results. Dr. Nehemiah Massed wanted to put Bactroban and this not sure what that will do for the areas that have a completely nonviable surface. I'd like to go back to [place]. 08/19/17-He is here in follow up evaluation for multiple ulcerations to the right lower extremity and right [name] and the left planter first metatarsal head ulcer. He has had biopsy to the right lateral proximal leg; no results available for review, patient and spouse report "negative" biopsy. We will continue with current treatment plan, using ace wrap compression to the right leg as his 15-23mHg compression stockings are providing suboptimal edema control. He will follow up in two weeks 09/02/17; the patient's biopsy done by dermatology/Dr. KNehemiah Massedwas apparently "negative". He has 2 open areas on the right calf one was the biopsy site on the right lateral calf. Most problematic wounds are on the right lateral malleolus and on the right lateral [name]. Both of these have some depth we've been using Santyl here. Also now a small open area remaining on the left plantar [name] 09/16/17 ; every week follow-up. The patient has been using Santyl to the area on the right lateral ankle and right lateral [name]. All the areas on the right calf including the biopsy site are closed. He still has the area on the first metatarsal head on the left. We've been using collagen to this as well oBiweekly visit. The area on the left first metatarsal head is just about closed. We have a new tape injury on the dorsal left [name] that was  caused by her nurse taking off his dressing. This is superficial. oHe still has difficult areas on the right lateral [name] and right lateral malleolus. He been using Endoform. I was hoping we were going to see better results than today. Still required debridement. I'm going to put Oasis through his insurance 10/14/17; the patient arrives today with really not a lot of change in the small punched-out areas on the right lateral [name] and right lateral malleolus. I have been using Endoform. He is also apparently okay for Oasis as he has a good secondary insurance to [employer] Nevertheless the wound bed is not ready for application 159/74/16 the patient arrives with not a lot of change in the small punched out areas on the right lateral [name] and right lateral malleolus. We've been using Endoform. Also this week he arrives with 2 blisters on the right lateral [name]. He thinks this may have been secondary to a Band-Aid that pulled on  the skin that was put over this area. He apparently also had unna paste on this area. Finally he has a fine macular rash on the left lower leg very pruritic. The cause of this is not clear he's been using hydrocortisone cream 11/04/17; the patient arrives with better looking wound surfaces on the right lateral [name] and right lateral malleolus. He has a blister on the right lateral [name] there is left a small open area just above the wounds. We have been using Santyl to the major areas. The patient had his angiogram on the left. There was nothing in the renal arteries aorta or iliac arteries he had diffusely calcific but nonstenotic common femoral arteries, , profunda femoris artery and popliteal arteries. The anterior tibial artery was occluded with poor reconstruction distally. The peroneal artery was patent without focal stenosis and was the dominant runoff vessel. Posterior tibial artery re-constituted at the ankle I believe from collaterals from the peroneal artery. Proximal  posterior tibial artery was nearly occluded with a greater than 90% the patient had a angioplasty of the left posterior tibial artery and tibioperoneal trunk. This procedure was on 11/02/17. Since then the patient has developed a petechial rash on the medial left calf and medial dorsal [name] as well as his toes. This is compatible with cholesterol emboli/atheromatous emboli. There is no open area. 11/18/17; the petechial rash after his procedure on the left has resolved. The 2 open areas on the right lateral malleolus and Igo, Orlen E. ([id number]) right lateral [name] are unfortunately about the same. We've been using Santyl. He is approved for Oasis with the surface of this is simply not ready for this 12/02/2017 In today for follow-up management of two open wounds on the right lateral malleolus and right lateral [name] to the right [name]. Wounds have not been making much progress. Recently had angiogram completed on both legs. Due to poor perfusion, he is not able to have debridement. He does report a decrease in his appetite not really eating much. Denies fever, chills, SOB, or pain. 12/16/17; following this patient for wound over his right lateral malleolus and right lateral [name]. These actually look quite a bit better than when I last saw these a month ago although I realize he was seen here 2 weeks ago. He has been revascularized. The wounds are smaller and at least the area over the right lateral malleolus has a healthy granulated base the area on the right lateral [name] is too small to really get an accurate assessment of the wound bed He rising clinic looking a lot more ill certainly than I last saw him. His height is hypotensive and hypothermic with a temperature of 92.9. They tell me he was in the ER in [place] 2 days ago. They kept him overnight. He had a chest x-ray that showed pleural effusions. BNP was very high. Nevertheless his sodium was 131 BUN slightly high creatinine  slightly higher. They put his Lasix on hold. Apparently an echocardiogram was done but I can't see this. Electronic Signature(s) Signed: 12/16/2017 4:51:45 PM By: Linton Ham MD Entered By: Linton Ham on 12/16/2017 09:54:57 Kuhar, [name] ([id number]) -------------------------------------------------------------------------------- Physical Exam Details Patient Name: [name] Date of Service: 12/16/2017 8:30 AM Medical Record Number: [id number] Patient Account Number: [id number] Date of Birth/Sex: [birth year] (76 y.o. M) Treating RN: Cornell Barman Primary Care Provider: Cyndi Bender Other Clinician: Referring Provider: Cyndi Bender Treating Provider/Extender: Tito Dine in Treatment: 94 Constitutional Patient is hypotensive.. Pulse regular  and within target range for patient.[name] Respirations regular, non-labored and within target range.. We had a hard time getting a temperature to register. Finally got 92.9.[name] The patient looks tired and exhausted.. Eyes Conjunctivae clear. No discharge. Respiratory He did not look to be in respiratory distress. Bibasilar crackles in both lungs. Cardiovascular 36 pansystolic murmur at the PMI radiating to the axilla. Pansystolic murmur also well heard over the left parasternal area.[name] Lymphatic None palpable in the popliteal area bilaterally. Integumentary (Hair, Skin) No additional skin lesions are seen. Psychiatric Patient looks fatigued but he is mentating normally. Notes Wound exam; the small areas on the right lateral malleolus and the right lateral [name]. Both of these look quite a bit better to me. The area on male malleolus is larger than the area on the [name] and appears to have a healthy granulated base. The area on the [name] is too small to really see an adequate wound bed. There is no evidence of surrounding infection. Electronic Signature(s) Signed: 12/16/2017 4:51:45 PM By: Linton Ham MD Entered By:  Linton Ham on 12/16/2017 09:57:59 Klausing, [name] ([id number]) -------------------------------------------------------------------------------- Physician Orders Details Patient Name: [name] Date of Service: 12/16/2017 8:30 AM Medical Record Number: [id number] Patient Account Number: [id number] Date of Birth/Sex: 1940-09-04 (76 y.o. M) Treating RN: Cornell Barman Primary Care Provider: Cyndi Bender Other Clinician: Referring Provider: Cyndi Bender Treating Provider/Extender: Tito Dine in Treatment: 70 Verbal / Phone Orders: No Diagnosis Coding Wound Cleansing Wound #6 Right,Lateral Malleolus o Clean wound with Normal Saline. Wound #8 Right,Distal,Lateral [name] o Clean wound with Normal Saline. Anesthetic (add to Medication List) Wound #6 Right,Lateral Malleolus o Topical Lidocaine 4% cream applied to wound bed prior to debridement (In Clinic Only). Wound #8 Right,Distal,Lateral [name] o Topical Lidocaine 4% cream applied to wound bed prior to debridement (In Clinic Only). Primary Wound Dressing Wound #6 Right,Lateral Malleolus o Santyl Ointment Wound #8 Right,Distal,Lateral [name] o Santyl Ointment Secondary Dressing Wound #6 Right,Lateral Malleolus o ABD pad o Conform/Kerlix Wound #8 Right,Distal,Lateral [name] o ABD pad o Conform/Kerlix Dressing Change Frequency Wound #6 Right,Lateral Malleolus o Change Dressing Monday, Wednesday, Friday Wound #8 Right,Distal,Lateral [name] o Change Dressing Monday, Wednesday, Friday Follow-up Appointments Wound #6 Right,Lateral Malleolus o Return Appointment in 2 weeks. Wound #8 Right,Distal,Lateral [name] o Return Appointment in 2 weeks. FINNICK, OROSZ ([id number]) Edema Control Wound #6 Right,Lateral Malleolus o Elevate legs to the level of the heart and pump ankles as often as possible Wound #8 Right,Distal,Lateral [name] o Elevate legs to the level of the heart and pump  ankles as often as possible Home Health Wound #6 [place] Visits - Monday, Wednesday and Friday on weeks patient does not come to wound care center. o Home Health Nurse may visit PRN to address patientos wound care needs. o FACE TO FACE ENCOUNTER: MEDICARE and MEDICAID PATIENTS: I certify that this patient is under my care and that I had a face-to-face encounter that meets the physician face-to-face encounter requirements with this patient on this date. The encounter with the patient was in whole or in part for the following MEDICAL CONDITION: (primary reason for [place]) MEDICAL NECESSITY: I certify, that based on my findings, NURSING services are a medically necessary home health service. HOME BOUND STATUS: I certify that my clinical findings support that this patient is homebound (i.e., Due to illness or injury, pt requires aid of supportive devices such as crutches, cane, wheelchairs, walkers, the use of special transportation or  the assistance of another person to leave their place of residence. There is a normal inability to leave the home and doing so requires considerable and taxing effort. Other absences are for medical reasons / religious services and are infrequent or of short duration when for other reasons). o If current dressing causes regression in wound condition, may D/C ordered dressing product/s and apply Normal Saline Moist Dressing daily until next [place] / Other MD appointment. [place] of regression in wound condition at [phone]. o Please direct any NON-WOUND related issues/requests for orders to patient's Primary Care Physician Wound #8 [place] Visits - Monday, Wednesday and Friday on weeks patient does not come to wound care center. o Home Health Nurse may visit PRN to address patientos wound care needs. o FACE TO FACE ENCOUNTER:  MEDICARE and MEDICAID PATIENTS: I certify that this patient is under my care and that I had a face-to-face encounter that meets the physician face-to-face encounter requirements with this patient on this date. The encounter with the patient was in whole or in part for the following MEDICAL CONDITION: (primary reason for [place]) MEDICAL NECESSITY: I certify, that based on my findings, NURSING services are a medically necessary home health service. HOME BOUND STATUS: I certify that my clinical findings support that this patient is homebound (i.e., Due to illness or injury, pt requires aid of supportive devices such as crutches, cane, wheelchairs, walkers, the use of special transportation or the assistance of another person to leave their place of residence. There is a normal inability to leave the home and doing so requires considerable and taxing effort. Other absences are for medical reasons / religious services and are infrequent or of short duration when for other reasons). o If current dressing causes regression in wound condition, may D/C ordered dressing product/s and apply Normal Saline Moist Dressing daily until next [place] / Other MD appointment. [place] of regression in wound condition at [phone]. o Please direct any NON-WOUND related issues/requests for orders to patient's Primary Care Physician Medications-please add to medication list. Wound #6 Right,Lateral Malleolus o Santyl Enzymatic Ointment Wound #8 Right,Distal,Lateral [name] o Santyl Enzymatic Ointment Electronic Signature(s) SVEN, PINHEIRO ([id number]) Signed: 12/16/2017 4:51:45 PM By: Linton Ham MD Signed: 12/16/2017 5:09:03 PM By: Gretta Cool, BSN, RN, CWS, Kim RN, BSN Entered By: Gretta Cool, BSN, RN, CWS, Kim on 12/16/2017 09:07:42 Richardson, [name] ([id number]) -------------------------------------------------------------------------------- Problem List  Details Patient Name: [name] Date of Service: 12/16/2017 8:30 AM Medical Record Number: [id number] Patient Account Number: [id number] Date of Birth/Sex: 09/20/1940 (76 y.o. 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 of 07/08/2017 No Yes other part of [name] I25.5 Ischemic cardiomyopathy 04/08/2017 No Yes Inactive Problems ICD-10 Code Description Active Date Inactive Date L97.212 Non-pressure chronic ulcer of right calf with fat layer exposed 04/08/2017 04/08/2017 Resolved Problems ICD-10 Code Description Active Date Resolved Date S41.111D Laceration without foreign body of right upper arm, subsequent 07/15/2017 07/15/2017 encounter Electronic Signature(s) Signed: 12/16/2017 4:51:45 PM By: Linton Ham MD  Entered By: Linton Ham on 12/16/2017 09:51:56 Dyke, [name] ([id number]) -------------------------------------------------------------------------------- Progress Note Details Patient Name: [name] Date of Service: 12/16/2017 8:30 AM Medical Record Number: [id number] Patient Account Number: [id number] Date of Birth/Sex: 1940/01/17 (76 y.o. M) Treating RN: Cornell Barman Primary Care Provider: Cyndi Bender Other Clinician: Referring Provider: Cyndi Bender Treating Provider/Extender: Tito Dine in Treatment: 36 Subjective History of Present Illness (HPI) 04/08/17; this is a complex 77 year old man referred here from [facility]. He had been referred there for bilateral lower extremity edema with ulcer formation predominantly on the right calf but also the right [name]. He had been receiving Unna boots bilaterally. The history here is long. He is not a diabetic however ICU looking through late 2018 he was worked up for chronic headaches, elevated inflammatory markers including C-reactive protein and ESR. He went on to actually have a left temporal artery biopsy wasn't that was negative.he received about 6 weeks of high-dose prednisone 60 mg with improvement in his inflammatory markers. He was admitted to hospital in late November with ventricular tachycardia syncope. He has known ischemic cardiomyopathy. He was admitted in the hospital in mid December. Apparently this was precipitated by a syncopal spell falling out of his scooter while at [place]. There was ventricular arrhythmia. He has an implantable defibrillator and echocardiogram showed severe LV dysfunction with an EF of 20% and valvular regurgitations including mild AR, moderate MR. There was no stenosis. He ruled in for a non-ST elevation MI in the setting of V. tach.his wife states that sometime during this hospitalization she noted multiple areas of skin change on the right lower calf which became evident just after he left the hospital. He was back in hospital in February with acute renal failure hyponatremia. This responded to fluid resuscitation.. Interestingly I can't see much description of his right leg at that point in time.he was followed by Dr. Nehemiah Massed of dermatology for the necrotic wounds on his right leg. Apparently a biopsy was planned at one point but not done although in some notes that suggests it was. I cannot see these results area He was noted to have a lot of edema. Was treated with bilateral Unna boots edges really helped with the swelling they have been using Bactroban to small open areas predominantly on the right anterior lower  leg His history is complicated by the fact that he has rheumatoid arthritis followed by rheumatology. He is followed by neurology for disabling headaches. At one point this was felt to be giant cell arteritis although a left temporal biopsy was apparently negative. He was given a prolonged course of prednisone at 60 mg which managed his sedimentation rates but apparently did not prove improve the headaches. This is been tapered to off on by rheumatology on 03/13/17 Vascular had plans to do a venous reflux workup as well as arterial studies in May. They also wanted to get him a lymphedema pump. As mentioned he's been using bacitracin under Unna boot wraps to both lower legs 04/15/17; the patient arrives with most of his wounds improved. These are small punched out wounds. Most of them remaining ones are on the right anterior calf with the most problematic over the right lateral malleolus. There are no new areas. The symptom complex or potential symptom complex we are dealing with his chronic disabling headaches with inflammatory markers not responsive to prednisone and with a negative temporal biopsy, lower extremity weakness, skin ulcerations just on the right leg. We have  managed to get his arterial studies moved up to April 30. He has a rheumatology consult at [facility] in June. He has seen dermatology locally, rheumatology locally, neurology locally. 04/22/17; small punched out areas on the right leg anteriorly posteriorly. Most of these appear to have closed over. Some of them have eschar over the surface. The most problematic area appears to be over the right lateral malleolus. We've been using prisma to all of this. He has arterial studies on April 30 and a rheumatology consult at [facility] on June 28 04/29/17; most of the small punched out areas on the right leg posteriorly are closed. He has 2 or 3 openings anteriorly but most of these appear to be on the way to closing. Still problematically over the  right lateral malleolus and right lateral [name] with almost ischemic-looking eschar. His arterial studies that I ordered are due to be done next week on the 30th so we should have them available for our next visit hopefully. He also saw a rheumatologist at [facility] and according to the patient he did 8 vials of blood. Finally he has scaling rash on his left [name] and what looks to be at [facility] area on the left anterior leg 05/06/17; most of the small punched out areas on the right leg posteriorly and anteriorly are closed. He still has one small one Kercher, Cap E. ([id number]) anteriorly one over the right lateral malleolus and one over the right lateral [name]. He had his arterial studies they didn't seem to do waveform analysis not exactly sure why however in any case is ABIs were noncompressible bilaterally. They did provide TBIs although looking at the pressures it appears that his TBIs are quite normal. I therefore went ahead and debrided the area over the right lateral malleolus and the right lateral [name] 05/13/17; most of the small punched out areas on the right leg posteriorly and anteriorly are closed. He continues to have problematic areas over the right lateral malleolus and the right lateral [name]. He still requiring aggressive debridement of these 2 wounds using silver collagen I reviewed the note from rheumatology I don't think they came up with a specific diagnosis although he is known to have seropositive RA. They did a panel of lab work when I was able to see his his AMA was negative, anti-smooth muscle antibodies negative antineutrophil cytoplasmic antibodies negative,Liv/kia type 1 negative. Serum C3 and C4 were negative. I don't see his muscle enzymes specifically. He was referred back to his local rheumatologist for management of his known rheumatoid arthritis.the patient states he still feels weak and fatigued. He states he has numbness in both feet and apparently is known to have  neuropathy 05/20/17; the patient continues to have a difficult problem on the right lateral malleolus and the right lateral [name]. Both of these wounds have no viable surface even with attempts at debridement. He has a new wound on the left plantar metatarsal head which looks more like a superficial diabetic pressure related injury then part of this underlying issue he has. Most of the rest of the wounds on his legs look satisfactory. Mostly on the right calf.reviewed his arterial studies which showed noncompressible vessels bilaterally but the TBIs were quite normal. 05/27/17; no real improvement in the right lateral malleolus and right lateral [name]. In fact the right lateral [name] is now on bone. I gave him doxycycline empirically last week it appears that he developed photosensitivity was 55 the son in a tractor. I'll not give him any  more of this. This is predominantly on his face and dorsal forearms and hands. I given this more as an anti- inflammatory. I'm going to have him seen by vascular surgery. His TBIs that he had done previously ordered by Dr. Brigitte Pulse were in the normal range They never did full arterial studies on him. he now has small punched out wounds on the right lateral ankle and right lateral [name]. I doubt these are ischemic however I would like a review of his macrovascular status. He does describe some pain at night. I'll reduce the compression from 3 layer to 2 layer and I'm not convinced that this is a macrovascular issue however I want to make sure. We've been using silver alginate 06/03/17; the patient's x-rays that I ordered last time of his right ankle and right lateral [name] did not show definite osteomyelitis. We've been using silver alginate. The wounds are not making any progress. The area on the plantar left first metatarsal head however appears to be better. The patient complains of weakness that is more of the fatigue. He says if he's walking his head will fall onto his  chest and that his legs literally gave out on him. He did see neurology in the past however that was at a time where his workup was for temporal arteritis and headaches. 06/10/17; the patient is making no progress with the areas on the right lateral [name] and right lateral malleolus. In fact the area on the [name] probes to bone. X-ray did not show osteomyelitis. He went and saw vein and vascular on 06/04/17 he was felt to have significant reflux in the left greater saphenous vein over this is not in the area we are most concerned about. He could be offered ablation. He was not felt to have venous reflux noted in the right lower extremity. He was felt to have some degree of lymphedema and he was felt to be a candidate for compression pumps. Finally a diagnostic arteriogram was suggested which is really what I'm most interested in. The patient has wife wanted time to think about this, I think they were confused about interacting between venous and arterial discussions. I think it would be probably well worth going through the angiogram. Culture I did have the deeper area on the right lateral [name] showed a few Enterococcus faecalis. I would like to start her on amoxicillin which they will start today for one-week 500 3 times a day 06/17/17; the patient still has punched out areas on the right lateral [name] and right lateral malleolus. There is not a viable surface here. We have been using Santyl. He also has an area on the plantar aspect of his left first metatarsal head this also seems to be better 06/24/17; patient's angiogram as on 07/06/17; still has punched out areas on the right lateral [name] and right lateral malleolus to which we've been applying Santyl-based dressings. He also has a more superficial area on the left plantar first metatarsal head, using collagen here 07/08/17; the patient had his angiogram. This perineal artery had a 70-80% stenosis. Similarly the posterior tibial artery also  was diseased. Furthermore down the posterior tibial artery in the distal segment was a short stenosis of 80%. His major vessels in his thigh and only minor irregularity. He had percutaneous angioplasty of the proximal right peroneal artery. Also angioplasty of the tibial peroneal trunk and proximal posterior tibial artery as well as the mid to distal segment of the posterior tibial artery. He handled this remarkably  well. The patient arrived with a new wound on his right anterior calf which I think is the reopening of one of the original small open areas 07/15/17; the areas on his right anterior calf is new. He has a new threatened area on the right tibia more superiorly which is not open yet but makes me wonder whether this is going to reopen as well. His original wounds on the right lateral [name] and Lungren, Jeromiah E. ([id number]) right lateral malleolus are somewhat better in terms of surface 07/22/17; the patient has a second open area on the right anterior. This was a threatened area superiorly last week. Each time this happens she has a small wound with a nephrotic cover. The areas on the right lateral [name] and right lateral malleolus are about the same. I did not attempt debridement in any of these today continuing with Santyl. The area on the left second metatarsal head looks better and he is healed laceration injuries on the arm from a fall last week with only the ventral forearm wound left 08/05/17 08/05/17; 2 week follow-up. This is a patient who initially presented with a multitude of small punched out areas on the right anterior calf greater than left dorsal [name]. This was in the setting of a constitutional illness at which time he saw multiple specialists was worked up for various rheumatologic diseases including temporal arteritis. Most of the areas on the right leg and a few on the left actually closed over however he he developed 2 small deep probing areas on the right lateral [name]  and right lateral malleolus. He also has an area on the left plantar first metatarsal head which is gradually been getting better. He was revascularized by Dr. dew about 4 weeks ago. He went to see Dr. Nehemiah Massed on 07/30/17; from review the note in time to the patient there wasn't an obvious cause. He did do a shave biopsy of the right anterior leg ulcer rule out cancer or other etiologies such as some embolic. This looks like some form of vasculopathy to me although trying to explain why it so much worse on the right leg than the left is been difficult. We will await the biopsy results. Dr. Nehemiah Massed wanted to put Bactroban and this not sure what that will do for the areas that have a completely nonviable surface. I'd like to go back to [place]. 08/19/17-He is here in follow up evaluation for multiple ulcerations to the right lower extremity and right [name] and the left planter first metatarsal head ulcer. He has had biopsy to the right lateral proximal leg; no results available for review, patient and spouse report "negative" biopsy. We will continue with current treatment plan, using ace wrap compression to the right leg as his 15-68mHg compression stockings are providing suboptimal edema control. He will follow up in two weeks 09/02/17; the patient's biopsy done by dermatology/Dr. KNehemiah Massedwas apparently "negative". He has 2 open areas on the right calf one was the biopsy site on the right lateral calf. Most problematic wounds are on the right lateral malleolus and on the right lateral [name]. Both of these have some depth we've been using Santyl here. Also now a small open area remaining on the left plantar [name] 09/16/17 ; every week follow-up. The patient has been using Santyl to the area on the right lateral ankle and right lateral [name]. All the areas on the right calf including the biopsy site are closed. He still has the area on the first  metatarsal head on the left. We've been using collagen to  this as well Biweekly visit. The area on the left first metatarsal head is just about closed. We have a new tape injury on the dorsal left [name] that was caused by her nurse taking off his dressing. This is superficial. He still has difficult areas on the right lateral [name] and right lateral malleolus. He been using Endoform. I was hoping we were going to see better results than today. Still required debridement. I'm going to put Oasis through his insurance 10/14/17; the patient arrives today with really not a lot of change in the small punched-out areas on the right lateral [name] and right lateral malleolus. I have been using Endoform. He is also apparently okay for Oasis as he has a good secondary insurance to [employer]. Nevertheless the wound bed is not ready for application 94/76/54; the patient arrives with not a lot of change in the small punched out areas on the right lateral [name] and right lateral malleolus. We've been using Endoform. Also this week he arrives with 2 blisters on the right lateral [name]. He thinks this may have been secondary to a Band-Aid that pulled on the skin that was put over this area. He apparently also had unna paste on this area. Finally he has a fine macular rash on the left lower leg very pruritic. The cause of this is not clear he's been using hydrocortisone cream 11/04/17; the patient arrives with better looking wound surfaces on the right lateral [name] and right lateral malleolus. He has a blister on the right lateral [name] there is left a small open area just above the wounds. We have been using Santyl to the major areas. The patient had his angiogram on the left. There was nothing in the renal arteries aorta or iliac arteries he had diffusely calcific but nonstenotic common femoral arteries, , profunda femoris artery and popliteal arteries. The anterior tibial artery was occluded with poor reconstruction distally. The peroneal artery was patent without focal  stenosis and was the dominant runoff vessel. Posterior tibial artery re-constituted at the ankle I believe from collaterals from the peroneal artery. Proximal posterior tibial artery was nearly occluded with a greater than 90% the patient had a angioplasty of the left posterior tibial artery and tibioperoneal trunk. This procedure was on 11/02/17. Since then the patient has developed a petechial rash on the medial left calf and medial dorsal [name] as well as his toes. This is compatible with cholesterol emboli/atheromatous emboli. There is no open area. REACE, BRESHEARS ([id number]) 11/18/17; the petechial rash after his procedure on the left has resolved. The 2 open areas on the right lateral malleolus and right lateral [name] are unfortunately about the same. We've been using Santyl. He is approved for [facility] with the surface of this is simply not ready for this 12/02/2017 In today for follow-up management of two open wounds on the right lateral malleolus and right lateral [name] to the right [name]. Wounds have not been making much progress. Recently had angiogram completed on both legs. Due to poor perfusion, he is not able to have debridement. He does report a decrease in his appetite not really eating much. Denies fever, chills, SOB, or pain. 12/16/17; following this patient for wound over his right lateral malleolus and right lateral [name]. These actually look quite a bit better than when I last saw these a month ago although I realize he was seen here 2 weeks ago. He has been revascularized.  The wounds are smaller and at least the area over the right lateral malleolus has a healthy granulated base the area on the right lateral [name] is too small to really get an accurate assessment of the wound bed He rising clinic looking a lot more ill certainly than I last saw him. His height is hypotensive and hypothermic with a temperature of 92.9. They tell me he was in the ER in [place] 2 days ago. They  kept him overnight. He had a chest x-ray that showed pleural effusions. BNP was very high. Nevertheless his sodium was 131 BUN slightly high creatinine slightly higher. They put his Lasix on hold. Apparently an echocardiogram was done but I can't see this. Objective Constitutional Patient is hypotensive.. Pulse regular and within target range for patient.[name] Respirations regular, non-labored and within target range.. We had a hard time getting a temperature to register. Finally got 92.9.[name] The patient looks tired and exhausted.. Vitals Time Taken: 8:45 AM, Height: 68 in, Weight: 161 lbs, BMI: 24.5, Pulse: 80 bpm, Respiratory Rate: 16 breaths/min, Blood Pressure: 92/55 mmHg. Eyes Conjunctivae clear. No discharge. Respiratory He did not look to be in respiratory distress. Bibasilar crackles in both lungs. Cardiovascular 36 pansystolic murmur at the PMI radiating to the axilla. Pansystolic murmur also well heard over the left parasternal area.[name] Lymphatic None palpable in the popliteal area bilaterally. Psychiatric Patient looks fatigued but he is mentating normally. General Notes: Wound exam; the small areas on the right lateral malleolus and the right lateral [name]. Both of these look quite a bit better to me. The area on male malleolus is larger than the area on the [name] and appears to have a healthy granulated base. The area on the [name] is too small to really see an adequate wound bed. There is no evidence of surrounding infection. Integumentary (Hair, Skin) No additional skin lesions are seen. COLBIN, JOVEL ([id number]) Wound #6 status is Open. Original cause of wound was Gradually Appeared. The wound is located on the Right,Lateral Malleolus. The wound measures 0.3cm length x 0.2cm width x 0.2cm depth; 0.047cm^2 area and 0.009cm^3 volume. There is Fat Layer (Subcutaneous Tissue) Exposed exposed. There is no tunneling noted, however, there is undermining starting at 12:00 and ending at  12:00 with a maximum distance of 0.4cm. There is a medium amount of serous drainage noted. The wound margin is flat and intact. There is no granulation within the wound bed. There is a medium (34-66%) amount of necrotic tissue within the wound bed including Adherent Slough. The periwound skin appearance did not exhibit: Callus, Crepitus, Excoriation, Induration, Rash, Scarring, Dry/Scaly, Maceration, Atrophie Blanche, Cyanosis, Ecchymosis, Hemosiderin Staining, Mottled, Pallor, Rubor, Erythema. Periwound temperature was noted as No Abnormality. Wound #8 status is Open. Original cause of wound was Gradually Appeared. The wound is located on the Right,Distal,Lateral [name]. The wound measures 0.3cm length x 0.2cm width x 0.2cm depth; 0.047cm^2 area and 0.009cm^3 volume. There is Fat Layer (Subcutaneous Tissue) Exposed exposed. There is no tunneling noted, however, there is undermining starting at 12:00 and ending at 12:00 with a maximum distance of 0.2cm. There is a medium amount of serous drainage noted. The wound margin is flat and intact. There is no granulation within the wound bed. There is a large (67-100%) amount of necrotic tissue within the wound bed including Adherent Slough. The periwound skin appearance did not exhibit: Callus, Crepitus, Excoriation, Induration, Rash, Scarring, Dry/Scaly, Maceration, Atrophie Blanche, Cyanosis, Ecchymosis, Hemosiderin Staining, Mottled, Pallor, Rubor, Erythema. Periwound temperature was  noted as No Abnormality. 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 There was a Chemical/Enzymatic/Mechanical debridement performed by Ricard Dillon, MD. With the following instrument(s): tongue blade after achieving pain control using Lidocaine. Agent used was [employer]. A time out was conducted at 09:10, prior to the start of the procedure. There was no bleeding. The procedure was tolerated well. Post Debridement Measurements: 0.3cm length x 0.2cm width x 0.2cm depth; 0.009cm^3 volume. Character of Wound/Ulcer Post Debridement requires further debridement. Severity of Tissue Post Debridement is: Fat layer exposed. Post procedure Diagnosis Wound #6: Same as Pre-Procedure Wound #8 Kantor, Kenta E. ([id number]) Pre-procedure diagnosis of Wound #8 is an Arterial Insufficiency Ulcer located on the Right,Distal,Lateral [name] .Severity of Tissue Pre Debridement is: Fat layer exposed. There was a Chemical/Enzymatic/Mechanical debridement performed by Ricard Dillon, MD. With the following instrument(s): tongue blade after achieving pain control using Lidocaine. Agent used was [employer]. A time out was conducted at 09:10, prior to the start of the procedure. There was no bleeding. The procedure was tolerated well. Post Debridement Measurements: 0.3cm length x 0.2cm width x 0.2cm depth; 0.009cm^3 volume. Character of Wound/Ulcer Post Debridement requires further debridement. Severity of Tissue Post Debridement is: Fat layer exposed. Post procedure Diagnosis Wound #8: Same as Pre-Procedure Plan Wound Cleansing: Wound #6 Right,Lateral Malleolus: Clean wound with Normal Saline. Wound #8  Right,Distal,Lateral [name]: Clean wound with Normal Saline. Anesthetic (add to Medication List): Wound #6 Right,Lateral Malleolus: Topical Lidocaine 4% cream applied to wound bed prior to debridement (In Clinic Only). Wound #8 Right,Distal,Lateral [name]: Topical Lidocaine 4% cream applied to wound bed prior to debridement (In Clinic Only). Primary Wound Dressing: Wound #6 Right,Lateral Malleolus: Santyl Ointment Wound #8 Right,Distal,Lateral [name]: Santyl Ointment Secondary Dressing: Wound #6 Right,Lateral Malleolus: ABD pad Conform/Kerlix Wound #8 Right,Distal,Lateral [name]: ABD pad Conform/Kerlix Dressing Change Frequency: Wound #6 Right,Lateral Malleolus: Change Dressing Monday, Wednesday, Friday Wound #8 Right,Distal,Lateral [name]: Change Dressing Monday, Wednesday, Friday Follow-up Appointments: Wound #6 Right,Lateral Malleolus: Return Appointment in 2 weeks. Wound #8 Right,Distal,Lateral [name]: Return Appointment in 2 weeks. Edema Control: Wound #6 Right,Lateral Malleolus: Elevate legs to the level of the heart and pump ankles as often as possible Wound #8 Right,Distal,Lateral [name]: Elevate legs to the level of the heart and pump ankles as often as possible Home Health: Wound #6 Right,Lateral Malleolus: [place] Visits - Monday, Wednesday and Friday on weeks patient does not come to wound care center. Home Health Nurse may visit PRN to address patient s wound care needs. FACE TO FACE ENCOUNTER: MEDICARE and MEDICAID PATIENTS: I certify that this patient is under my care and that I Frechette, Marquavius E. ([id number]) had a face-to-face encounter that meets the physician face-to-face encounter requirements with this patient on this date. The encounter with the patient was in whole or in part for  the following MEDICAL CONDITION: (primary reason for Home Healthcare) MEDICAL NECESSITY: I certify, that based on my findings, NURSING services are a medically necessary  home health service. HOME BOUND STATUS: I certify that my clinical findings support that this patient is homebound (i.e., Due to illness or injury, pt requires aid of supportive devices such as crutches, cane, wheelchairs, walkers, the use of special transportation or the assistance of another person to leave their place of residence. There is a normal inability to leave the home and doing so requires considerable and taxing effort. Other absences are for medical reasons / religious services and are infrequent or of short duration when for other reasons). If current dressing causes regression in wound condition, may D/C ordered dressing product/s and apply Normal Saline Moist Dressing daily until next [place] / Other MD appointment. [place] of regression in wound condition at [phone]. Please direct any NON-WOUND related issues/requests for orders to patient's Primary Care Physician Wound #8 Right,Distal,Lateral [name]: [place] Visits - Monday, Wednesday and Friday on weeks patient does not come to wound care center. Home Health Nurse may visit PRN to address patient s wound care needs. FACE TO FACE ENCOUNTER: MEDICARE and MEDICAID PATIENTS: I certify that this patient is under my care and that I had a face-to-face encounter that meets the physician face-to-face encounter requirements with this patient on this date. The encounter with the patient was in whole or in part for the following MEDICAL CONDITION: (primary reason for [place]) MEDICAL NECESSITY: I certify, that based on my findings, NURSING services are a medically necessary home health service. HOME BOUND STATUS: I certify that my clinical findings support that this patient is homebound (i.e., Due to illness or injury, pt requires aid of supportive devices such as crutches, cane, wheelchairs, walkers, the use of special transportation or the assistance of another person to leave  their place of residence. There is a normal inability to leave the home and doing so requires considerable and taxing effort. Other absences are for medical reasons / religious services and are infrequent or of short duration when for other reasons). If current dressing causes regression in wound condition, may D/C ordered dressing product/s and apply Normal Saline Moist Dressing daily until next [place] / Other MD appointment. [place] of regression in wound condition at [phone]. Please direct any NON-WOUND related issues/requests for orders to patient's Primary Care Physician Medications-please add to medication list.: Wound #6 Right,Lateral Malleolus: Santyl Enzymatic Ointment Wound #8 Right,Distal,Lateral [name]: Santyl Enzymatic Ointment #1 from a wound point of view I'm going to continue with Santyl. The wounds are too small now to consider anything else but on ointment. Iodosorb ointment came to mind but the patient is improving with Santyl therefore I continued it. At one point he was approved for [place] but these are too small to consider this #2 the patient is at least in congestive heart failure. I spoke to his cardiologist on the phone Dr. Ubaldo Glassing, we both agree that he probably needs to be admitted to the hospital. I looked at an echocardiogram from 2016 at which point he had an EF between 20 and 30%. This all could be an advanced ischemic cardiomyopathy #3 he is hypothermic although he did take ice water on the way to the clinic. #4 I have spoken to triage at [place] expressing my concern for this patient if he doesn't get admitted to hospital and stabilized Electronic Signature(s) Signed: 12/16/2017  4:51:45 PM By: Linton Ham MD Entered By: Linton Ham on 12/16/2017 10:00:03 Hoffmann, [name] ([id number]) -------------------------------------------------------------------------------- SuperBill Details Patient Name: [name] Date of Service: 12/16/2017 Medical Record Number: [id number] Patient Account Number: [id number] Date of Birth/Sex: July 14, 1940 (76 y.o. 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 I25.5 Ischemic cardiomyopathy Facility Procedures CPT4 Code: [id number] Description: 34917 - [place] W/O ANES NON SELECT Modifier: Quantity: 1 CPT4 Code: [id number] Description: 94801 - [place] W/O ANES NON SELECT Modifier: Quantity: 1 Physician Procedures CPT4 Code Description: [id number] - WC PHYS LEVEL 4 - EST PT ICD-10 Diagnosis Description L97.512 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 with fat l L97.521 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

## 2017-12-18 NOTE — Discharge Instructions (Signed)
Follow-up with primary care physician 3 days Follow-up with cardiology Dr. Ubaldo Glassing in a week Home health PT

## 2017-12-18 NOTE — Progress Notes (Signed)
Discharge instructions reviewed with pt and spouse. All questions addressed at time of review.

## 2017-12-18 NOTE — Discharge Summary (Signed)
[facility] NAME: [name]    MR#:  [id number]  DATE OF BIRTH:  September 15, 1940  DATE OF ADMISSION:  12/16/2017 ADMITTING PHYSICIAN: Demetrios Loll, MD  DATE OF DISCHARGE: 12/18/17   PRIMARY CARE PHYSICIAN: Cyndi Bender, PA-C    ADMISSION DIAGNOSIS:  Weakness [R53.1] Pleural effusion [J90]  DISCHARGE DIAGNOSIS:  Generalized weakness Hypotension SECONDARY DIAGNOSIS:   Past Medical History:  Diagnosis Date  . AICD (automatic cardioverter/defibrillator) present    [employer]  . Arthritis    RA  . Balance problem   . CHF (congestive heart failure) ([place])   . Collagen vascular disease ([place])    RA since 2014  . Complication of anesthesia    slow to wake up  . Coronary artery disease   . Deviated nasal septum   . Dysrhythmia    v tach  . Hearing loss    TINNITUS  . High cholesterol   . Hypothyroidism   . Leg weakness   . Myocardial infarction ([place])    x 2  . Peripheral vascular disease ([place])   . Presence of permanent cardiac pacemaker    WITH DEFIBRILATOR ([employer])  . Seasonal allergies    RHINITIS  . Shortness of breath dyspnea    COUGH  . Sinus headache   . Sinusitis    CHRONIC  . Sleep apnea   . Vertigo    DYSEQUILIBRIUM    HOSPITAL COURSE:  HPI   [name]  is a 76 y.o. male with a known history of multiple medical problem including CHF, CAD, s/p S AICD, PVD, sleep apnea and etc. the patient presents the ED with above chief complaints.  The patient has had generalized weakness and poor oral intake for several days.  He was sent from wound clinic for generalized lethargy.  He was noted hypotensive and hypothermic.  The patient denies any other symptoms.  He was just discharged from another hospital to home 2 days ago after treatment for dehydration.  His right foot wound is stable.  Dr. Jimmye Norman requested admission.    Hospital course  Generalized weakness due to dehydration  and low blood pressure. Clinically doing much better Seen by cardiology Dr. Ubaldo Glassing Resume home medication Lasix to take as needed DiscontinuedLopressor and Entresto , cardiology will consider resuming Entresto if blood pressure permits in future  Negative orthostatics  Seen and evaluated by physical therapy recommending home health PT   Chronic systolic CHF with ejection fraction 25% Resume home medication Lasix to take as needed DiscontinueLopressor and Entresto, continue amiodarone  Follow-up with Dr. Ubaldo Glassing patient's primary cardiologist, okay to discharge patient from cardiology standpoint  patient is reporting joint pains while taking statin and he stopped taking that, recommended patient to discuss with his cardiologist during the follow-up visit   Lactic acidosis, mild, follow-up lactic acid level. Blood cultures are negative.  Patient is afebrile.  Not considering antibiotics at this time Lactic acid 2.8-1.7  Chronic hyponatremia. Hold Lasix for now. Follow-up BMP. Sodium at 129.  Patient is asymptomatic  Chronic elevated troponin. Patient is asymptomatic, seen by cardiology no interventions needed at this time.  Outpatient follow-up with Dr. Ubaldo Glassing   Discharge patient home with home health DISCHARGE CONDITIONS:   Stable  CONSULTS OBTAINED:  Treatment Team:  [name], MD   PROCEDURES none  DRUG ALLERGIES:   Allergies  Allergen Reactions  . Bactrim [Sulfamethoxazole-Trimethoprim] Other (See Comments)    Severe dehydration  .  Doxycycline Other (See Comments)    sunburn  . Pravastatin Other (See Comments)    Reaction:  Joint pain     DISCHARGE MEDICATIONS:   Allergies as of 12/18/2017      Reactions   Bactrim [sulfamethoxazole-trimethoprim] Other (See Comments)   Severe dehydration   Doxycycline Other (See Comments)   sunburn   Pravastatin Other (See Comments)   Reaction:  Joint pain       Medication List    STOP taking these medications    atorvastatin 10 MG tablet Commonly known as:  LIPITOR   ENTRESTO 24-26 MG Generic drug:  sacubitril-valsartan   metoprolol succinate 25 MG 24 hr tablet Commonly known as:  TOPROL-XL     TAKE these medications   amiodarone 200 MG tablet Commonly known as:  PACERONE Take 100 mg by mouth at bedtime.   aspirin EC 81 MG tablet Take 81 mg by mouth daily.   cetirizine 10 MG tablet Commonly known as:  ZYRTEC Take 10 mg by mouth daily.   clopidogrel 75 MG tablet Commonly known as:  PLAVIX Take 1 tablet (75 mg total) by mouth daily.   docusate sodium 100 MG capsule Commonly known as:  COLACE Take 100-200 mg by mouth daily as needed (for constipation.).   doxycycline 100 MG capsule Commonly known as:  VIBRAMYCIN TAKE 1 CAPSULE BY MOUTH TWICE DAILY FOR PNEUMONIA   furosemide 40 MG tablet Commonly known as:  LASIX Take 20 mg by mouth daily as needed for edema (for fluid retention.).   leflunomide 10 MG tablet Commonly known as:  ARAVA Take 10 mg by mouth daily.   levothyroxine 50 MCG tablet Commonly known as:  SYNTHROID, LEVOTHROID Take 50 mcg by mouth daily before breakfast.   mexiletine 150 MG capsule Commonly known as:  MEXITIL Take 150 mg by mouth every 8 (eight) hours.   nitroGLYCERIN 0.4 MG SL tablet Commonly known as:  NITROSTAT Place 0.4 mg under the tongue every 5 (five) minutes as needed for chest pain.   PEG 3350 Powd Take 17 g by mouth daily as needed (for constipation.).   potassium chloride 10 MEQ tablet Commonly known as:  K-DUR Take 10 mEq by mouth daily.   SANTYL ointment Generic drug:  collagenase Apply 1 application topically 3 (three) times a week. Apply a "nickel thick" amount to wound as directed   traMADol 50 MG tablet Commonly known as:  ULTRAM Take 1 tablet by mouth 3 (three) times daily as needed (for pan.).   TYLENOL 8 HOUR ARTHRITIS PAIN 650 MG CR tablet Generic drug:  acetaminophen Take 650-1,300 mg by mouth every 8 (eight)  hours as needed (pain/headaches.).   vitamin B-12 1000 MCG tablet Commonly known as:  CYANOCOBALAMIN Take 1,000 mcg by mouth daily.   Vitamin D3 50 MCG (2000 UT) Tabs Take 2,000 Units by mouth daily.        DISCHARGE INSTRUCTIONS:   Follow-up with primary care physician 3 days Follow-up with cardiology Dr. Ubaldo Glassing in a week Home health PT  DIET:  Cardiac diet  DISCHARGE CONDITION:  Fair  ACTIVITY:  Activity as tolerated  OXYGEN:  Home Oxygen: No.   Oxygen Delivery: room air  DISCHARGE LOCATION:  home   If you experience worsening of your admission symptoms, develop shortness of breath, life threatening emergency, suicidal or homicidal thoughts you must seek medical attention immediately by calling 911 or calling your MD immediately  if symptoms less severe.  You Must read complete instructions/literature along with  all the possible adverse reactions/side effects for all the Medicines you take and that have been prescribed to you. Take any new Medicines after you have completely understood and accpet all the possible adverse reactions/side effects.   Please note  You were cared for by a hospitalist during your hospital stay. If you have any questions about your discharge medications or the care you received while you were in the hospital after you are discharged, you can call the unit and asked to speak with the hospitalist on call if the hospitalist that took care of you is not available. Once you are discharged, your primary care physician will handle any further medical issues. Please note that NO REFILLS for any discharge medications will be authorized once you are discharged, as it is imperative that you return to your primary care physician (or establish a relationship with a primary care physician if you do not have one) for your aftercare needs so that they can reassess your need for medications and monitor your lab values.     Today  Chief Complaint  Patient  presents with  . Weakness   Patient is feeling much better.  Denies any dizziness.  Weakness significantly improved.  Wants to go home.  Okay to discharge patient from cardiology standpoint  ROS:  CONSTITUTIONAL: Denies fevers, chills. Denies any fatigue, weakness.  EYES: Denies blurry vision, double vision, eye pain. EARS, NOSE, THROAT: Denies tinnitus, ear pain, hearing loss. RESPIRATORY: Denies cough, wheeze, shortness of breath.  CARDIOVASCULAR: Denies chest pain, palpitations, edema.  GASTROINTESTINAL: Denies nausea, vomiting, diarrhea, abdominal pain. Denies bright red blood per rectum. GENITOURINARY: Denies dysuria, hematuria. ENDOCRINE: Denies nocturia or thyroid problems. HEMATOLOGIC AND LYMPHATIC: Denies easy bruising or bleeding. SKIN: Denies rash or lesion. MUSCULOSKELETAL: Denies pain in neck, back, shoulder, knees, hips or arthritic symptoms.  NEUROLOGIC: Denies paralysis, paresthesias.  PSYCHIATRIC: Denies anxiety or depressive symptoms.   VITAL SIGNS:  Blood pressure 104/69, pulse 81, temperature 97.6 F (36.4 C), temperature source Oral, resp. rate 18, height 5\' 9"  (1.753 m), weight 75.2 kg, SpO2 97 %.  I/O:  No intake or output data in the 24 hours ending 12/18/17 1214  PHYSICAL EXAMINATION:  GENERAL:  77 y.o.-year-old patient lying in the bed with no acute distress.  EYES: Pupils equal, round, reactive to light and accommodation. No scleral icterus. Extraocular muscles intact.  HEENT: Head atraumatic, normocephalic. Oropharynx and nasopharynx clear.  NECK:  Supple, no jugular venous distention. No thyroid enlargement, no tenderness.  LUNGS: Normal breath sounds bilaterally, no wheezing, rales,rhonchi or crepitation. No use of accessory muscles of respiration.  CARDIOVASCULAR: S1, S2 normal. No murmurs, rubs, or gallops.  ABDOMEN: Soft, non-tender, non-distended. Bowel sounds present.  EXTREMITIES: No pedal edema, cyanosis, or clubbing.  NEUROLOGIC: Awake, alert  and oriented x3 sensation intact. Gait not checked.  PSYCHIATRIC: The patient is alert and oriented x 3.  SKIN: No obvious rash, lesion, or ulcer.   DATA REVIEW:   CBC Recent Labs  Lab 12/17/17 0402  WBC 7.1  HGB 11.0*  HCT 34.7*  PLT 212    Chemistries  Recent Labs  Lab 12/16/17 1023 12/17/17 0402  NA 128* 129*  K 4.6 3.7  CL 94* 95*  CO2 22 22  GLUCOSE 87 91  BUN 36* 33*  CREATININE 1.58* 1.42*  CALCIUM 9.1 8.7*  MG 2.4  --   AST 161*  --   ALT 81*  --   ALKPHOS 113  --   BILITOT 1.2  --  Cardiac Enzymes Recent Labs  Lab 12/16/17 1023  TROPONINI 0.09*    Microbiology Results  Results for orders placed or performed during the hospital encounter of 12/16/17  Blood culture (routine x 2)     Status: None (Preliminary result)   Collection Time: 12/16/17 11:42 AM  Result Value Ref Range Status   Specimen Description BLOOD RIGHT ANTECUBITAL  Final   Special Requests   Final    BOTTLES DRAWN AEROBIC AND ANAEROBIC Blood Culture adequate volume   Culture   Final    NO GROWTH 2 DAYS Performed at [facility], [address] [place], [place] [address]    Report Status PENDING  Incomplete  Blood culture (routine x 2)     Status: None (Preliminary result)   Collection Time: 12/16/17 11:42 AM  Result Value Ref Range Status   Specimen Description BLOOD BLOOD LEFT HAND  Final   Special Requests   Final    BOTTLES DRAWN AEROBIC AND ANAEROBIC Blood Culture adequate volume   Culture   Final    NO GROWTH 2 DAYS Performed at [facility], [address] [place], [place] [address]    Report Status PENDING  Incomplete    RADIOLOGY:  Dg Chest 1 View  Result Date: 12/16/2017 CLINICAL DATA:  Patient presented to the wound center today with hypotension hypothermia. EXAM: CHEST  1 VIEW COMPARISON:  PA and lateral chest 12/14/2017. FINDINGS: Again seen is marked cardiomegaly. Large right pleural effusion has markedly increased since the prior  examination. Small left pleural effusion is unchanged. There is interstitial pulmonary edema. Basilar atelectasis is noted. The patient is status post CABG with an AICD in place. IMPRESSION: Marked increase in a right pleural effusion. Small left effusion is unchanged. Cardiomegaly and interstitial edema. Bibasilar airspace disease is likely due to compressive atelectasis from the patient's effusions. Electronically Signed   By: Inge Rise M.D.   On: 12/16/2017 11:19   Ct Head Wo Contrast  Result Date: 12/16/2017 CLINICAL DATA:  Altered level of consciousness, unexplained EXAM: CT HEAD WITHOUT CONTRAST TECHNIQUE: Contiguous axial images were obtained from the base of the skull through the vertex without intravenous contrast. COMPARISON:  12/01/2016 FINDINGS: Brain: No evidence of acute infarction, hemorrhage, hydrocephalus, extra-axial collection or mass lesion/mass effect. Cerebral volume loss that is mild for age. Mild to moderate chronic small vessel ischemia in the cerebral white matter Vascular: Extensive atherosclerotic calcification. No hyperdense vessel. Skull: No acute or aggressive finding Sinuses/Orbits: No acute finding. Chronic right maxillary sinusitis with calcified debris and sclerotic wall thickening. Bilateral cataract resection IMPRESSION: Senescent changes without acute intracranial finding. Electronically Signed   By: Monte Fantasia M.D.   On: 12/16/2017 11:28    EKG:   Orders placed or performed during the hospital encounter of 12/16/17  . EKG 12-Lead  . EKG 12-Lead      Management plans discussed with the patient, family and they are in agreement.  CODE STATUS:     Code Status Orders  (From admission, onward)         Start     Ordered   12/16/17 1638  Full code  Continuous     12/16/17 1637        Code Status History    Date Active Date Inactive Code Status Order ID Comments User Context   07/06/2017 0931 07/06/2017 1415 Full Code [id number]  Algernon Huxley,  MD Inpatient   02/20/2017 0041 02/22/2017 1618 DNR [id number]  Amelia Jo, MD ED  12/02/2016 0225 12/02/2016 1836 DNR [id number]  Saundra Shelling, MD ED   08/18/2014 2215 08/20/2014 1438 DNR [id number]  Loletha Grayer, MD ED   07/15/2014 0326 07/18/2014 1728 Full Code [id number]  [name], MD ED      TOTAL TIME TAKING CARE OF THIS PATIENT: 45 minutes.   Note: This dictation was prepared with Dragon dictation along with smaller phrase technology. Any transcriptional errors that result from this process are unintentional.   @[facility] @  on 12/18/2017 at 12:14 PM  Between 7am to 6pm - Pager - [phone]  After 6pm go to [email or web address] - password [email or web address] [facility]  Office  [phone]  CC: Primary care physician; Cyndi Bender, PA-C

## 2017-12-21 ENCOUNTER — Telehealth: Payer: Self-pay

## 2017-12-21 LAB — CULTURE, BLOOD (ROUTINE X 2)
Culture: NO GROWTH
Culture: NO GROWTH
Special Requests: ADEQUATE
Special Requests: ADEQUATE

## 2017-12-21 NOTE — Telephone Encounter (Signed)
EMMI Follow-up: Noted on the report that the patient wasn't sure who to call for changes in his condition and did not have any follow-up appointments.  I was unable to leave [name] a message to remind him that he has some follow-up appointments that need to made as listed on his After Visit Summary.   Will try again.

## 2017-12-22 ENCOUNTER — Telehealth: Payer: Self-pay

## 2017-12-22 DIAGNOSIS — Z95810 Presence of automatic (implantable) cardiac defibrillator: Secondary | ICD-10-CM | POA: Diagnosis not present

## 2017-12-22 DIAGNOSIS — M7989 Other specified soft tissue disorders: Secondary | ICD-10-CM | POA: Diagnosis not present

## 2017-12-22 DIAGNOSIS — R570 Cardiogenic shock: Secondary | ICD-10-CM | POA: Diagnosis not present

## 2017-12-22 DIAGNOSIS — J811 Chronic pulmonary edema: Secondary | ICD-10-CM | POA: Diagnosis not present

## 2017-12-22 DIAGNOSIS — E782 Mixed hyperlipidemia: Secondary | ICD-10-CM | POA: Diagnosis not present

## 2017-12-22 DIAGNOSIS — Z006 Encounter for examination for normal comparison and control in clinical research program: Secondary | ICD-10-CM | POA: Diagnosis not present

## 2017-12-22 DIAGNOSIS — R188 Other ascites: Secondary | ICD-10-CM | POA: Diagnosis not present

## 2017-12-22 DIAGNOSIS — E1165 Type 2 diabetes mellitus with hyperglycemia: Secondary | ICD-10-CM | POA: Diagnosis not present

## 2017-12-22 DIAGNOSIS — I10 Essential (primary) hypertension: Secondary | ICD-10-CM | POA: Diagnosis not present

## 2017-12-22 DIAGNOSIS — E43 Unspecified severe protein-calorie malnutrition: Secondary | ICD-10-CM | POA: Diagnosis not present

## 2017-12-22 DIAGNOSIS — K8020 Calculus of gallbladder without cholecystitis without obstruction: Secondary | ICD-10-CM | POA: Diagnosis not present

## 2017-12-22 DIAGNOSIS — I5022 Chronic systolic (congestive) heart failure: Secondary | ICD-10-CM | POA: Diagnosis not present

## 2017-12-22 DIAGNOSIS — I340 Nonrheumatic mitral (valve) insufficiency: Secondary | ICD-10-CM | POA: Diagnosis not present

## 2017-12-22 DIAGNOSIS — E871 Hypo-osmolality and hyponatremia: Secondary | ICD-10-CM | POA: Diagnosis not present

## 2017-12-22 DIAGNOSIS — I517 Cardiomegaly: Secondary | ICD-10-CM | POA: Diagnosis not present

## 2017-12-22 DIAGNOSIS — J90 Pleural effusion, not elsewhere classified: Secondary | ICD-10-CM | POA: Diagnosis not present

## 2017-12-22 DIAGNOSIS — R0602 Shortness of breath: Secondary | ICD-10-CM | POA: Diagnosis not present

## 2017-12-22 DIAGNOSIS — R0600 Dyspnea, unspecified: Secondary | ICD-10-CM | POA: Diagnosis not present

## 2017-12-22 DIAGNOSIS — N179 Acute kidney failure, unspecified: Secondary | ICD-10-CM | POA: Diagnosis not present

## 2017-12-22 DIAGNOSIS — R0902 Hypoxemia: Secondary | ICD-10-CM | POA: Diagnosis not present

## 2017-12-22 DIAGNOSIS — I509 Heart failure, unspecified: Secondary | ICD-10-CM | POA: Diagnosis not present

## 2017-12-22 DIAGNOSIS — R069 Unspecified abnormalities of breathing: Secondary | ICD-10-CM | POA: Diagnosis not present

## 2017-12-22 DIAGNOSIS — I5023 Acute on chronic systolic (congestive) heart failure: Secondary | ICD-10-CM | POA: Diagnosis not present

## 2017-12-22 DIAGNOSIS — I130 Hypertensive heart and chronic kidney disease with heart failure and stage 1 through stage 4 chronic kidney disease, or unspecified chronic kidney disease: Secondary | ICD-10-CM | POA: Diagnosis not present

## 2017-12-22 DIAGNOSIS — G4489 Other headache syndrome: Secondary | ICD-10-CM | POA: Diagnosis not present

## 2017-12-22 NOTE — Telephone Encounter (Signed)
EMMI Follow-up: Second follow-up call to Mr. Radel.  I left my contact information for Mr. Radloff to call me at his convenience if he had any concerns.  Case closed.

## 2017-12-23 ENCOUNTER — Ambulatory Visit: Payer: Medicare Other | Admitting: Internal Medicine

## 2017-12-23 DIAGNOSIS — N179 Acute kidney failure, unspecified: Secondary | ICD-10-CM | POA: Diagnosis present

## 2017-12-23 DIAGNOSIS — Z9889 Other specified postprocedural states: Secondary | ICD-10-CM | POA: Diagnosis not present

## 2017-12-23 DIAGNOSIS — R4189 Other symptoms and signs involving cognitive functions and awareness: Secondary | ICD-10-CM | POA: Diagnosis not present

## 2017-12-23 DIAGNOSIS — I071 Rheumatic tricuspid insufficiency: Secondary | ICD-10-CM | POA: Diagnosis not present

## 2017-12-23 DIAGNOSIS — M069 Rheumatoid arthritis, unspecified: Secondary | ICD-10-CM | POA: Diagnosis present

## 2017-12-23 DIAGNOSIS — I10 Essential (primary) hypertension: Secondary | ICD-10-CM | POA: Diagnosis not present

## 2017-12-23 DIAGNOSIS — E039 Hypothyroidism, unspecified: Secondary | ICD-10-CM | POA: Diagnosis not present

## 2017-12-23 DIAGNOSIS — M6281 Muscle weakness (generalized): Secondary | ICD-10-CM | POA: Diagnosis not present

## 2017-12-23 DIAGNOSIS — Z006 Encounter for examination for normal comparison and control in clinical research program: Secondary | ICD-10-CM | POA: Diagnosis not present

## 2017-12-23 DIAGNOSIS — R570 Cardiogenic shock: Secondary | ICD-10-CM | POA: Diagnosis present

## 2017-12-23 DIAGNOSIS — K5900 Constipation, unspecified: Secondary | ICD-10-CM | POA: Diagnosis not present

## 2017-12-23 DIAGNOSIS — E440 Moderate protein-calorie malnutrition: Secondary | ICD-10-CM | POA: Diagnosis not present

## 2017-12-23 DIAGNOSIS — R1312 Dysphagia, oropharyngeal phase: Secondary | ICD-10-CM | POA: Diagnosis not present

## 2017-12-23 DIAGNOSIS — R432 Parageusia: Secondary | ICD-10-CM | POA: Diagnosis not present

## 2017-12-23 DIAGNOSIS — R430 Anosmia: Secondary | ICD-10-CM | POA: Diagnosis not present

## 2017-12-23 DIAGNOSIS — I130 Hypertensive heart and chronic kidney disease with heart failure and stage 1 through stage 4 chronic kidney disease, or unspecified chronic kidney disease: Secondary | ICD-10-CM | POA: Diagnosis present

## 2017-12-23 DIAGNOSIS — L89899 Pressure ulcer of other site, unspecified stage: Secondary | ICD-10-CM | POA: Diagnosis not present

## 2017-12-23 DIAGNOSIS — I252 Old myocardial infarction: Secondary | ICD-10-CM | POA: Diagnosis not present

## 2017-12-23 DIAGNOSIS — I5020 Unspecified systolic (congestive) heart failure: Secondary | ICD-10-CM | POA: Diagnosis not present

## 2017-12-23 DIAGNOSIS — I2510 Atherosclerotic heart disease of native coronary artery without angina pectoris: Secondary | ICD-10-CM | POA: Diagnosis present

## 2017-12-23 DIAGNOSIS — Z955 Presence of coronary angioplasty implant and graft: Secondary | ICD-10-CM | POA: Diagnosis not present

## 2017-12-23 DIAGNOSIS — J90 Pleural effusion, not elsewhere classified: Secondary | ICD-10-CM | POA: Diagnosis not present

## 2017-12-23 DIAGNOSIS — J9811 Atelectasis: Secondary | ICD-10-CM | POA: Diagnosis not present

## 2017-12-23 DIAGNOSIS — I340 Nonrheumatic mitral (valve) insufficiency: Secondary | ICD-10-CM | POA: Diagnosis present

## 2017-12-23 DIAGNOSIS — I472 Ventricular tachycardia: Secondary | ICD-10-CM | POA: Diagnosis present

## 2017-12-23 DIAGNOSIS — R51 Headache: Secondary | ICD-10-CM | POA: Diagnosis not present

## 2017-12-23 DIAGNOSIS — I739 Peripheral vascular disease, unspecified: Secondary | ICD-10-CM | POA: Diagnosis not present

## 2017-12-23 DIAGNOSIS — I129 Hypertensive chronic kidney disease with stage 1 through stage 4 chronic kidney disease, or unspecified chronic kidney disease: Secondary | ICD-10-CM | POA: Diagnosis not present

## 2017-12-23 DIAGNOSIS — J918 Pleural effusion in other conditions classified elsewhere: Secondary | ICD-10-CM | POA: Diagnosis present

## 2017-12-23 DIAGNOSIS — I517 Cardiomegaly: Secondary | ICD-10-CM | POA: Diagnosis not present

## 2017-12-23 DIAGNOSIS — R627 Adult failure to thrive: Secondary | ICD-10-CM | POA: Diagnosis present

## 2017-12-23 DIAGNOSIS — Z950 Presence of cardiac pacemaker: Secondary | ICD-10-CM | POA: Diagnosis not present

## 2017-12-23 DIAGNOSIS — R262 Difficulty in walking, not elsewhere classified: Secondary | ICD-10-CM | POA: Diagnosis not present

## 2017-12-23 DIAGNOSIS — J329 Chronic sinusitis, unspecified: Secondary | ICD-10-CM | POA: Diagnosis present

## 2017-12-23 DIAGNOSIS — L97909 Non-pressure chronic ulcer of unspecified part of unspecified lower leg with unspecified severity: Secondary | ICD-10-CM | POA: Diagnosis present

## 2017-12-23 DIAGNOSIS — Z95810 Presence of automatic (implantable) cardiac defibrillator: Secondary | ICD-10-CM | POA: Diagnosis not present

## 2017-12-23 DIAGNOSIS — R0600 Dyspnea, unspecified: Secondary | ICD-10-CM | POA: Diagnosis not present

## 2017-12-23 DIAGNOSIS — E785 Hyperlipidemia, unspecified: Secondary | ICD-10-CM | POA: Diagnosis present

## 2017-12-23 DIAGNOSIS — Z951 Presence of aortocoronary bypass graft: Secondary | ICD-10-CM | POA: Diagnosis not present

## 2017-12-23 DIAGNOSIS — I951 Orthostatic hypotension: Secondary | ICD-10-CM | POA: Diagnosis present

## 2017-12-23 DIAGNOSIS — I351 Nonrheumatic aortic (valve) insufficiency: Secondary | ICD-10-CM | POA: Diagnosis not present

## 2017-12-23 DIAGNOSIS — F3289 Other specified depressive episodes: Secondary | ICD-10-CM | POA: Diagnosis not present

## 2017-12-23 DIAGNOSIS — D638 Anemia in other chronic diseases classified elsewhere: Secondary | ICD-10-CM | POA: Diagnosis present

## 2017-12-23 DIAGNOSIS — I5023 Acute on chronic systolic (congestive) heart failure: Secondary | ICD-10-CM | POA: Diagnosis present

## 2017-12-23 DIAGNOSIS — R112 Nausea with vomiting, unspecified: Secondary | ICD-10-CM | POA: Diagnosis not present

## 2017-12-23 DIAGNOSIS — I5021 Acute systolic (congestive) heart failure: Secondary | ICD-10-CM | POA: Diagnosis not present

## 2017-12-23 DIAGNOSIS — J811 Chronic pulmonary edema: Secondary | ICD-10-CM | POA: Diagnosis not present

## 2017-12-23 DIAGNOSIS — E782 Mixed hyperlipidemia: Secondary | ICD-10-CM | POA: Diagnosis not present

## 2017-12-23 DIAGNOSIS — E876 Hypokalemia: Secondary | ICD-10-CM | POA: Diagnosis not present

## 2017-12-23 DIAGNOSIS — R439 Unspecified disturbances of smell and taste: Secondary | ICD-10-CM | POA: Diagnosis not present

## 2017-12-23 DIAGNOSIS — R269 Unspecified abnormalities of gait and mobility: Secondary | ICD-10-CM | POA: Diagnosis not present

## 2017-12-23 DIAGNOSIS — N189 Chronic kidney disease, unspecified: Secondary | ICD-10-CM | POA: Diagnosis not present

## 2017-12-23 DIAGNOSIS — E43 Unspecified severe protein-calorie malnutrition: Secondary | ICD-10-CM | POA: Diagnosis present

## 2017-12-23 DIAGNOSIS — R42 Dizziness and giddiness: Secondary | ICD-10-CM | POA: Diagnosis not present

## 2017-12-23 DIAGNOSIS — J9691 Respiratory failure, unspecified with hypoxia: Secondary | ICD-10-CM | POA: Diagnosis present

## 2017-12-23 DIAGNOSIS — I361 Nonrheumatic tricuspid (valve) insufficiency: Secondary | ICD-10-CM | POA: Diagnosis not present

## 2017-12-23 DIAGNOSIS — R918 Other nonspecific abnormal finding of lung field: Secondary | ICD-10-CM | POA: Diagnosis not present

## 2017-12-23 DIAGNOSIS — G4733 Obstructive sleep apnea (adult) (pediatric): Secondary | ICD-10-CM | POA: Diagnosis present

## 2017-12-23 DIAGNOSIS — Z79899 Other long term (current) drug therapy: Secondary | ICD-10-CM | POA: Diagnosis not present

## 2017-12-23 DIAGNOSIS — I255 Ischemic cardiomyopathy: Secondary | ICD-10-CM | POA: Diagnosis present

## 2017-12-23 DIAGNOSIS — R531 Weakness: Secondary | ICD-10-CM | POA: Diagnosis not present

## 2017-12-23 DIAGNOSIS — E569 Vitamin deficiency, unspecified: Secondary | ICD-10-CM | POA: Diagnosis not present

## 2017-12-23 DIAGNOSIS — R55 Syncope and collapse: Secondary | ICD-10-CM | POA: Diagnosis not present

## 2017-12-23 DIAGNOSIS — Z452 Encounter for adjustment and management of vascular access device: Secondary | ICD-10-CM | POA: Diagnosis not present

## 2017-12-23 DIAGNOSIS — I5022 Chronic systolic (congestive) heart failure: Secondary | ICD-10-CM | POA: Diagnosis not present

## 2017-12-23 DIAGNOSIS — H538 Other visual disturbances: Secondary | ICD-10-CM | POA: Diagnosis not present

## 2018-01-08 ENCOUNTER — Encounter (INDEPENDENT_AMBULATORY_CARE_PROVIDER_SITE_OTHER): Payer: Medicare Other

## 2018-01-08 ENCOUNTER — Ambulatory Visit (INDEPENDENT_AMBULATORY_CARE_PROVIDER_SITE_OTHER): Payer: Medicare Other | Admitting: Vascular Surgery

## 2018-01-12 DIAGNOSIS — I340 Nonrheumatic mitral (valve) insufficiency: Secondary | ICD-10-CM | POA: Diagnosis not present

## 2018-01-22 DIAGNOSIS — R262 Difficulty in walking, not elsewhere classified: Secondary | ICD-10-CM | POA: Diagnosis not present

## 2018-01-22 DIAGNOSIS — I5022 Chronic systolic (congestive) heart failure: Secondary | ICD-10-CM | POA: Diagnosis not present

## 2018-01-22 DIAGNOSIS — I10 Essential (primary) hypertension: Secondary | ICD-10-CM | POA: Diagnosis not present

## 2018-01-22 DIAGNOSIS — R000 Tachycardia, unspecified: Secondary | ICD-10-CM | POA: Diagnosis not present

## 2018-01-22 DIAGNOSIS — R0689 Other abnormalities of breathing: Secondary | ICD-10-CM | POA: Diagnosis not present

## 2018-01-22 DIAGNOSIS — I499 Cardiac arrhythmia, unspecified: Secondary | ICD-10-CM | POA: Diagnosis not present

## 2018-01-22 DIAGNOSIS — L89899 Pressure ulcer of other site, unspecified stage: Secondary | ICD-10-CM | POA: Diagnosis not present

## 2018-01-22 DIAGNOSIS — D649 Anemia, unspecified: Secondary | ICD-10-CM | POA: Diagnosis not present

## 2018-01-22 DIAGNOSIS — K5900 Constipation, unspecified: Secondary | ICD-10-CM | POA: Diagnosis not present

## 2018-01-22 DIAGNOSIS — I472 Ventricular tachycardia: Secondary | ICD-10-CM | POA: Diagnosis not present

## 2018-01-22 DIAGNOSIS — I739 Peripheral vascular disease, unspecified: Secondary | ICD-10-CM | POA: Diagnosis not present

## 2018-01-22 DIAGNOSIS — E039 Hypothyroidism, unspecified: Secondary | ICD-10-CM | POA: Diagnosis not present

## 2018-01-22 DIAGNOSIS — I5020 Unspecified systolic (congestive) heart failure: Secondary | ICD-10-CM | POA: Diagnosis not present

## 2018-01-22 DIAGNOSIS — E569 Vitamin deficiency, unspecified: Secondary | ICD-10-CM | POA: Diagnosis not present

## 2018-01-22 DIAGNOSIS — F3289 Other specified depressive episodes: Secondary | ICD-10-CM | POA: Diagnosis not present

## 2018-01-22 DIAGNOSIS — E876 Hypokalemia: Secondary | ICD-10-CM | POA: Diagnosis not present

## 2018-01-22 DIAGNOSIS — E440 Moderate protein-calorie malnutrition: Secondary | ICD-10-CM | POA: Diagnosis not present

## 2018-01-22 DIAGNOSIS — M6281 Muscle weakness (generalized): Secondary | ICD-10-CM | POA: Diagnosis not present

## 2018-01-22 DIAGNOSIS — I5023 Acute on chronic systolic (congestive) heart failure: Secondary | ICD-10-CM | POA: Diagnosis not present

## 2018-01-22 DIAGNOSIS — R4020 Unspecified coma: Secondary | ICD-10-CM | POA: Diagnosis not present

## 2018-01-22 DIAGNOSIS — F329 Major depressive disorder, single episode, unspecified: Secondary | ICD-10-CM | POA: Diagnosis not present

## 2018-01-22 DIAGNOSIS — R55 Syncope and collapse: Secondary | ICD-10-CM | POA: Diagnosis not present

## 2018-01-22 DIAGNOSIS — I214 Non-ST elevation (NSTEMI) myocardial infarction: Secondary | ICD-10-CM | POA: Diagnosis not present

## 2018-01-22 DIAGNOSIS — I2510 Atherosclerotic heart disease of native coronary artery without angina pectoris: Secondary | ICD-10-CM | POA: Diagnosis not present

## 2018-01-22 DIAGNOSIS — R1312 Dysphagia, oropharyngeal phase: Secondary | ICD-10-CM | POA: Diagnosis not present

## 2018-01-22 DIAGNOSIS — R634 Abnormal weight loss: Secondary | ICD-10-CM | POA: Diagnosis not present

## 2018-01-23 DIAGNOSIS — E039 Hypothyroidism, unspecified: Secondary | ICD-10-CM | POA: Diagnosis not present

## 2018-01-23 DIAGNOSIS — I10 Essential (primary) hypertension: Secondary | ICD-10-CM | POA: Diagnosis not present

## 2018-01-23 DIAGNOSIS — M6281 Muscle weakness (generalized): Secondary | ICD-10-CM | POA: Diagnosis not present

## 2018-01-23 DIAGNOSIS — F329 Major depressive disorder, single episode, unspecified: Secondary | ICD-10-CM | POA: Diagnosis not present

## 2018-01-23 DIAGNOSIS — I2510 Atherosclerotic heart disease of native coronary artery without angina pectoris: Secondary | ICD-10-CM | POA: Diagnosis not present

## 2018-01-23 DIAGNOSIS — R634 Abnormal weight loss: Secondary | ICD-10-CM | POA: Diagnosis not present

## 2018-01-23 DIAGNOSIS — R000 Tachycardia, unspecified: Secondary | ICD-10-CM | POA: Diagnosis not present

## 2018-01-25 DIAGNOSIS — I472 Ventricular tachycardia: Secondary | ICD-10-CM | POA: Diagnosis not present

## 2018-01-25 DIAGNOSIS — I5022 Chronic systolic (congestive) heart failure: Secondary | ICD-10-CM | POA: Diagnosis not present

## 2018-01-25 DIAGNOSIS — I2510 Atherosclerotic heart disease of native coronary artery without angina pectoris: Secondary | ICD-10-CM | POA: Diagnosis not present

## 2018-01-25 DIAGNOSIS — I214 Non-ST elevation (NSTEMI) myocardial infarction: Secondary | ICD-10-CM | POA: Diagnosis not present

## 2018-01-28 DIAGNOSIS — D649 Anemia, unspecified: Secondary | ICD-10-CM | POA: Diagnosis not present

## 2018-01-28 DIAGNOSIS — M6281 Muscle weakness (generalized): Secondary | ICD-10-CM | POA: Diagnosis not present

## 2018-01-28 DIAGNOSIS — I2510 Atherosclerotic heart disease of native coronary artery without angina pectoris: Secondary | ICD-10-CM | POA: Diagnosis not present

## 2018-01-28 DIAGNOSIS — E039 Hypothyroidism, unspecified: Secondary | ICD-10-CM | POA: Diagnosis not present

## 2018-01-28 DIAGNOSIS — F329 Major depressive disorder, single episode, unspecified: Secondary | ICD-10-CM | POA: Diagnosis not present

## 2018-01-28 DIAGNOSIS — I10 Essential (primary) hypertension: Secondary | ICD-10-CM | POA: Diagnosis not present

## 2018-01-28 DIAGNOSIS — R634 Abnormal weight loss: Secondary | ICD-10-CM | POA: Diagnosis not present

## 2018-02-01 DIAGNOSIS — I472 Ventricular tachycardia: Secondary | ICD-10-CM | POA: Diagnosis not present

## 2018-02-01 DIAGNOSIS — I10 Essential (primary) hypertension: Secondary | ICD-10-CM | POA: Diagnosis not present

## 2018-02-01 DIAGNOSIS — I739 Peripheral vascular disease, unspecified: Secondary | ICD-10-CM | POA: Diagnosis not present

## 2018-02-01 DIAGNOSIS — R55 Syncope and collapse: Secondary | ICD-10-CM | POA: Diagnosis not present

## 2018-02-01 DIAGNOSIS — I214 Non-ST elevation (NSTEMI) myocardial infarction: Secondary | ICD-10-CM | POA: Diagnosis not present

## 2018-02-01 DIAGNOSIS — I5022 Chronic systolic (congestive) heart failure: Secondary | ICD-10-CM | POA: Diagnosis not present

## 2018-02-01 DIAGNOSIS — I2510 Atherosclerotic heart disease of native coronary artery without angina pectoris: Secondary | ICD-10-CM | POA: Diagnosis not present

## 2018-02-06 DIAGNOSIS — 419620001 Death: Secondary | SNOMED CT | POA: Diagnosis not present

## 2018-02-06 DEATH — deceased

## 2018-02-07 DIAGNOSIS — M6281 Muscle weakness (generalized): Secondary | ICD-10-CM | POA: Diagnosis not present

## 2018-02-07 DIAGNOSIS — I10 Essential (primary) hypertension: Secondary | ICD-10-CM | POA: Diagnosis not present

## 2018-02-07 DIAGNOSIS — E039 Hypothyroidism, unspecified: Secondary | ICD-10-CM | POA: Diagnosis not present

## 2018-02-07 DIAGNOSIS — R634 Abnormal weight loss: Secondary | ICD-10-CM | POA: Diagnosis not present

## 2018-02-07 DIAGNOSIS — F329 Major depressive disorder, single episode, unspecified: Secondary | ICD-10-CM | POA: Diagnosis not present

## 2018-02-07 DIAGNOSIS — I2510 Atherosclerotic heart disease of native coronary artery without angina pectoris: Secondary | ICD-10-CM | POA: Diagnosis not present

## 2018-03-05 ENCOUNTER — Encounter (INDEPENDENT_AMBULATORY_CARE_PROVIDER_SITE_OTHER): Payer: Medicare Other

## 2018-03-05 ENCOUNTER — Ambulatory Visit (INDEPENDENT_AMBULATORY_CARE_PROVIDER_SITE_OTHER): Payer: Medicare Other | Admitting: Vascular Surgery

## 2018-03-07 DEATH — deceased

## 2018-08-04 ENCOUNTER — Other Ambulatory Visit: Payer: Self-pay
